# Patient Record
Sex: Female | Born: 1970 | Hispanic: No | Marital: Single | Smoking: Never smoker
Health system: Southern US, Community
[De-identification: ages and names within clinical notes are randomized; demographics above are authoritative.]

## PROBLEM LIST (undated history)

## (undated) DIAGNOSIS — R0600 Dyspnea, unspecified: Secondary | ICD-10-CM

## (undated) DIAGNOSIS — I6789 Other cerebrovascular disease: Secondary | ICD-10-CM

## (undated) DIAGNOSIS — I5022 Chronic systolic (congestive) heart failure: Secondary | ICD-10-CM

## (undated) DIAGNOSIS — G43909 Migraine, unspecified, not intractable, without status migrainosus: Secondary | ICD-10-CM

## (undated) DIAGNOSIS — N183 Chronic kidney disease, stage 3 unspecified: Secondary | ICD-10-CM

## (undated) DIAGNOSIS — I209 Angina pectoris, unspecified: Secondary | ICD-10-CM

## (undated) DIAGNOSIS — G959 Disease of spinal cord, unspecified: Secondary | ICD-10-CM

## (undated) DIAGNOSIS — I10 Essential (primary) hypertension: Secondary | ICD-10-CM

## (undated) DIAGNOSIS — E119 Type 2 diabetes mellitus without complications: Secondary | ICD-10-CM

## (undated) DIAGNOSIS — G4733 Obstructive sleep apnea (adult) (pediatric): Secondary | ICD-10-CM

## (undated) DIAGNOSIS — I255 Ischemic cardiomyopathy: Secondary | ICD-10-CM

## (undated) DIAGNOSIS — J45909 Unspecified asthma, uncomplicated: Secondary | ICD-10-CM

## (undated) DIAGNOSIS — Z8614 Personal history of Methicillin resistant Staphylococcus aureus infection: Secondary | ICD-10-CM

## (undated) DIAGNOSIS — G4730 Sleep apnea, unspecified: Secondary | ICD-10-CM

## (undated) DIAGNOSIS — G4700 Insomnia, unspecified: Secondary | ICD-10-CM

## (undated) DIAGNOSIS — I2510 Atherosclerotic heart disease of native coronary artery without angina pectoris: Secondary | ICD-10-CM

## (undated) DIAGNOSIS — E785 Hyperlipidemia, unspecified: Secondary | ICD-10-CM

## (undated) DIAGNOSIS — I340 Nonrheumatic mitral (valve) insufficiency: Secondary | ICD-10-CM

## (undated) DIAGNOSIS — F419 Anxiety disorder, unspecified: Secondary | ICD-10-CM

## (undated) DIAGNOSIS — E113593 Type 2 diabetes mellitus with proliferative diabetic retinopathy without macular edema, bilateral: Secondary | ICD-10-CM

## (undated) DIAGNOSIS — F32A Depression, unspecified: Secondary | ICD-10-CM

## (undated) DIAGNOSIS — M869 Osteomyelitis, unspecified: Secondary | ICD-10-CM

## (undated) DIAGNOSIS — M48061 Spinal stenosis, lumbar region without neurogenic claudication: Secondary | ICD-10-CM

## (undated) HISTORY — DX: Type 2 diabetes mellitus without complications: E11.9

## (undated) HISTORY — DX: Ischemic cardiomyopathy: I25.5

## (undated) HISTORY — DX: Insomnia, unspecified: G47.00

## (undated) HISTORY — DX: Chronic systolic (congestive) heart failure: I50.22

## (undated) HISTORY — DX: Other cerebrovascular disease: I67.89

## (undated) HISTORY — DX: Spinal stenosis, lumbar region without neurogenic claudication: M48.061

## (undated) HISTORY — DX: Anxiety disorder, unspecified: F41.9

## (undated) HISTORY — DX: Hyperlipidemia, unspecified: E78.5

## (undated) HISTORY — DX: Disease of spinal cord, unspecified: G95.9

## (undated) HISTORY — DX: Chronic kidney disease, stage 3 unspecified: N18.30

## (undated) HISTORY — DX: Type 2 diabetes mellitus with proliferative diabetic retinopathy without macular edema, bilateral: E11.3593

## (undated) HISTORY — DX: Migraine, unspecified, not intractable, without status migrainosus: G43.909

## (undated) HISTORY — DX: Obstructive sleep apnea (adult) (pediatric): G47.33

## (undated) HISTORY — DX: Nonrheumatic mitral (valve) insufficiency: I34.0

## (undated) HISTORY — DX: Unspecified asthma, uncomplicated: J45.909

## (undated) HISTORY — DX: Depression, unspecified: F32.A

---
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
## 1997-12-14 HISTORY — PX: CHOLECYSTECTOMY: SHX55

## 2008-08-17 ENCOUNTER — Emergency Department: Payer: Self-pay | Admitting: Emergency Medicine

## 2009-08-15 ENCOUNTER — Emergency Department: Payer: Self-pay | Admitting: Emergency Medicine

## 2012-05-09 ENCOUNTER — Emergency Department: Payer: Self-pay | Admitting: Emergency Medicine

## 2013-03-10 ENCOUNTER — Ambulatory Visit: Payer: Self-pay | Admitting: Family Medicine

## 2013-03-15 ENCOUNTER — Ambulatory Visit: Payer: Self-pay | Admitting: Family Medicine

## 2013-03-15 LAB — HM MAMMOGRAPHY: HM MAMMO: NORMAL

## 2013-08-10 LAB — HM HIV SCREENING LAB: HM HIV Screening: NEGATIVE

## 2013-12-03 ENCOUNTER — Emergency Department: Payer: Self-pay | Admitting: Emergency Medicine

## 2014-09-04 DIAGNOSIS — F419 Anxiety disorder, unspecified: Secondary | ICD-10-CM | POA: Insufficient documentation

## 2015-09-29 LAB — HM PAP SMEAR

## 2016-01-31 ENCOUNTER — Encounter: Payer: Self-pay | Admitting: Emergency Medicine

## 2016-01-31 ENCOUNTER — Emergency Department: Payer: BLUE CROSS/BLUE SHIELD

## 2016-01-31 DIAGNOSIS — M79672 Pain in left foot: Secondary | ICD-10-CM | POA: Diagnosis present

## 2016-01-31 DIAGNOSIS — E1165 Type 2 diabetes mellitus with hyperglycemia: Secondary | ICD-10-CM | POA: Insufficient documentation

## 2016-01-31 DIAGNOSIS — L03116 Cellulitis of left lower limb: Secondary | ICD-10-CM | POA: Diagnosis not present

## 2016-01-31 LAB — COMPREHENSIVE METABOLIC PANEL
ALT: 18 U/L (ref 14–54)
AST: 15 U/L (ref 15–41)
Albumin: 3.6 g/dL (ref 3.5–5.0)
Alkaline Phosphatase: 78 U/L (ref 38–126)
Anion gap: 8 (ref 5–15)
BUN: 11 mg/dL (ref 6–20)
CHLORIDE: 102 mmol/L (ref 101–111)
CO2: 23 mmol/L (ref 22–32)
Calcium: 8.8 mg/dL — ABNORMAL LOW (ref 8.9–10.3)
Creatinine, Ser: 0.54 mg/dL (ref 0.44–1.00)
GFR calc Af Amer: >60 mL/min (ref >60)
GFR calc non Af Amer: >60 mL/min (ref >60)
Glucose, Bld: 362 mg/dL — ABNORMAL HIGH (ref 65–99)
POTASSIUM: 3.8 mmol/L (ref 3.5–5.1)
SODIUM: 133 mmol/L — AB (ref 135–145)
Total Bilirubin: 1 mg/dL (ref 0.3–1.2)
Total Protein: 7.8 g/dL (ref 6.5–8.1)

## 2016-01-31 LAB — CBC WITH DIFFERENTIAL/PLATELET
BASOS ABS: 0.1 10*3/uL (ref 0–0.1)
BASOS PCT: 1 %
Eosinophils Absolute: 0.1 10*3/uL (ref 0–0.7)
Eosinophils Relative: 1 %
HCT: 34.3 % — ABNORMAL LOW (ref 35.0–47.0)
Hemoglobin: 12.1 g/dL (ref 12.0–16.0)
Lymphocytes Relative: 18 %
Lymphs Abs: 1.9 10*3/uL (ref 1.0–3.6)
MCH: 28.8 pg (ref 26.0–34.0)
MCHC: 35.4 g/dL (ref 32.0–36.0)
MCV: 81.5 fL (ref 80.0–100.0)
Monocytes Absolute: 0.7 10*3/uL (ref 0.2–0.9)
Monocytes Relative: 7 %
NEUTROS ABS: 7.9 10*3/uL — AB (ref 1.4–6.5)
NEUTROS PCT: 73 %
PLATELETS: 287 10*3/uL (ref 150–440)
RBC: 4.21 MIL/uL (ref 3.80–5.20)
RDW: 13.8 % (ref 11.5–14.5)
WBC: 10.6 10*3/uL (ref 3.6–11.0)

## 2016-01-31 NOTE — ED Notes (Signed)
Pt states history of diabetes, is not taking medication for. Pt with wound noted to left 5th toe, wound to left second toe, redness and pain with swelling to left foot. Cms intact to all toes. Pt denies fever.

## 2016-02-01 ENCOUNTER — Emergency Department
Admission: EM | Admit: 2016-02-01 | Discharge: 2016-02-01 | Disposition: A | Discharge status: Home / Self Care | Payer: BLUE CROSS/BLUE SHIELD | Attending: Emergency Medicine | Admitting: Emergency Medicine

## 2016-02-01 DIAGNOSIS — R739 Hyperglycemia, unspecified: Secondary | ICD-10-CM

## 2016-02-01 DIAGNOSIS — E119 Type 2 diabetes mellitus without complications: Secondary | ICD-10-CM

## 2016-02-01 DIAGNOSIS — L03116 Cellulitis of left lower limb: Secondary | ICD-10-CM

## 2016-02-01 HISTORY — DX: Type 2 diabetes mellitus without complications: E11.9

## 2016-02-01 MED ORDER — SULFAMETHOXAZOLE-TRIMETHOPRIM 800-160 MG PO TABS
2.0000 | ORAL_TABLET | Freq: Once | ORAL | Status: AC
  Administered 2016-02-01: 2 via ORAL
  Filled 2016-02-01: qty 2

## 2016-02-01 MED ORDER — METFORMIN HCL 1000 MG PO TABS: 500.0000 mg | ORAL_TABLET | Freq: Two times a day (BID) | ORAL | Status: DC

## 2016-02-01 MED ORDER — METFORMIN HCL 500 MG PO TABS
1000.0000 mg | ORAL_TABLET | Freq: Once | ORAL | Status: AC
  Administered 2016-02-01: 1000 mg via ORAL
  Filled 2016-02-01: qty 2

## 2016-02-01 MED ORDER — SULFAMETHOXAZOLE-TRIMETHOPRIM 800-160 MG PO TABS
2.0000 | ORAL_TABLET | Freq: Two times a day (BID) | ORAL | Status: AC
Start: 2016-02-01 — End: 2016-02-10

## 2016-02-01 NOTE — ED Provider Notes (Signed)
[facility] Emergency Department Provider Note  ____________________________________________  Time seen: 1:05 AM  I have reviewed the triage vital signs and the nursing notes.   HISTORY  Chief Complaint Foot Pain     HPI Sarah Duffy is a 45 y.o. female with history of type 2 diabetes which she is noncompliant secondary to absence of health insurance presents with history of abrasions sustained to her left fifth toe approximately 3 days ago with progressive redness swelling and scant purulent drainage. Patient denies any fever no nausea or vomiting. Patient states that she has not been on any anti-glycemic 4 years secondary to losing her health insurance.     Past Medical History  Diagnosis Date  . Diabetes mellitus without complication (HCC)     There are no active problems to display for this patient.   Past Surgical History  Procedure Laterality Date  . Cholecystectomy      Current Outpatient Rx  Name  Route  Sig  Dispense  Refill  . metFORMIN (GLUCOPHAGE) 1000 MG tablet   Oral   Take 0.5 tablets (500 mg total) by mouth 2 (two) times daily with a meal.   180 tablet   0   . sulfamethoxazole-trimethoprim (BACTRIM DS,SEPTRA DS) 800-160 MG tablet   Oral   Take 2 tablets by mouth 2 (two) times daily.   40 tablet   0     Allergies Review of patient's allergies indicates no known allergies.  No family history on file.  Social History Social History  Substance Use Topics  . Smoking status: Never Smoker   . Smokeless tobacco: Never Used  . Alcohol Use: No    Review of Systems  Constitutional: Negative for fever. Eyes: Negative for visual changes. ENT: Negative for sore throat. Cardiovascular: Negative for chest pain. Respiratory: Negative for shortness of breath. Gastrointestinal: Negative for abdominal pain, vomiting and diarrhea. Genitourinary: Negative for dysuria. Musculoskeletal: Negative for back pain. Skin: Negative  for rash. Positive left fifth toe pain swelling and redness Neurological: Negative for headaches, focal weakness or numbness.   10-point ROS otherwise negative.  ____________________________________________   PHYSICAL EXAM:  VITAL SIGNS: ED Triage Vitals  Enc Vitals Group     BP 01/31/16 2205 167/80 mmHg     Pulse Rate 01/31/16 2205 104     Resp 01/31/16 2205 20     Temp 01/31/16 2205 99 F (37.2 C)     Temp Source 01/31/16 2205 Oral     SpO2 01/31/16 2205 98 %     Weight 01/31/16 2205 178 lb (80.74 kg)     Height 01/31/16 2205  (1.727 m)     Head Cir --      Peak Flow --      Pain Score 01/31/16 2205 8     Pain Loc --      Pain Edu? --      Excl. in GC? --      Constitutional: Alert and oriented. Well appearing and in no distress. Eyes: Conjunctivae are normal. PERRL. Normal extraocular movements. ENT   Head: Normocephalic and atraumatic.   Nose: No congestion/rhinnorhea.   Mouth/Throat: Mucous membranes are moist.   Neck: No stridor. Hematological/Lymphatic/Immunilogical: No cervical lymphadenopathy. Cardiovascular: Normal rate, regular rhythm. Normal and symmetric distal pulses are present in all extremities. No murmurs, rubs, or gallops. Respiratory: Normal respiratory effort without tachypnea nor retractions. Breath sounds are clear and equal bilaterally. No wheezes/rales/rhonchi. Gastrointestinal: Soft and nontender. No distention. There is no CVA  tenderness. Genitourinary: deferred Musculoskeletal: Nontender with normal range of motion in all extremities. No joint effusions.  No lower extremity tenderness nor edema. Neurologic:  Normal speech and language. No gross focal neurologic deficits are appreciated. Speech is normal.  Skin:  Left fifth toe abrasion with surrounding erythema extending up to the dorsum aspect of the foot with associated nonpitting edema Psychiatric: Mood and affect are normal. Speech and behavior are normal. Patient  exhibits appropriate insight and judgment.  ____________________________________________    LABS (pertinent positives/negatives)  Labs Reviewed  CBC WITH DIFFERENTIAL/PLATELET - Abnormal; Notable for the following:    HCT 34.3 (*)    Neutro Abs 7.9 (*)    All other components within normal limits  COMPREHENSIVE METABOLIC PANEL - Abnormal; Notable for the following:    Sodium 133 (*)    Glucose, Bld 362 (*)    Calcium 8.8 (*)    All other components within normal limits           INITIAL IMPRESSION / ASSESSMENT AND PLAN / ED COURSE  Pertinent labs & imaging results that were available during my care of the patient were reviewed by me and considered in my medical decision making (see chart for details).  Patient received Bactrim DS as well as 1000 g of metformin. Patient prescribed the same for home. Patient advised to return to emergency department for any worsening erythema pain, swelling or fever. [facility]  ____________________________________________   FINAL CLINICAL IMPRESSION(S) / ED DIAGNOSES  Final diagnoses:  Cellulitis of left foot  Hyperglycemia  Type 2 diabetes mellitus without complication, without long-term current use of insulin (HCC)      Darci Current, MD 02/01/16 [phone]

## 2016-02-01 NOTE — ED Notes (Signed)
Redness outlined and teaching provided to patient per MD order. Gauze applied with instructions on home care. Pt verbalized understanding and ability to comply.

## 2016-02-01 NOTE — Discharge Instructions (Signed)
Cellulitis °Cellulitis is an infection of the skin and the tissue beneath it. The infected area is usually red and tender. Cellulitis occurs most often in the arms and lower legs.  °CAUSES  °Cellulitis is caused by bacteria that enter the skin through cracks or cuts in the skin. The most common types of bacteria that cause cellulitis are staphylococci and streptococci. °SIGNS AND SYMPTOMS  °· Redness and warmth. °· Swelling. °· Tenderness or pain. °· Fever. °DIAGNOSIS  °Your health care provider can usually determine what is wrong based on a physical exam. Blood tests may also be done. °TREATMENT  °Treatment usually involves taking an antibiotic medicine. °HOME CARE INSTRUCTIONS  °· Take your antibiotic medicine as directed by your health care provider. Finish the antibiotic even if you start to feel better. °· Keep the infected arm or leg elevated to reduce swelling. °· Apply a warm cloth to the affected area up to 4 times per day to relieve pain. °· Take medicines only as directed by your health care provider. °· Keep all follow-up visits as directed by your health care provider. °SEEK MEDICAL CARE IF:  °· You notice red streaks coming from the infected area. °· Your red area gets larger or turns dark in color. °· Your bone or joint underneath the infected area becomes painful after the skin has healed. °· Your infection returns in the same area or another area. °· You notice a swollen bump in the infected area. °· You develop new symptoms. °· You have a fever. °SEEK IMMEDIATE MEDICAL CARE IF:  °· You feel very sleepy. °· You develop vomiting or diarrhea. °· You have a general ill feeling (malaise) with muscle aches and pains. °  °This information is not intended to replace advice given to you by your health care provider. Make sure you discuss any questions you have with your health care provider. °  °Document Released: 09/09/2005 Document Revised: 08/21/2015 Document Reviewed: 02/15/2012 °Elsevier Interactive  Patient Education ©2016 [employer] ° °Hyperglycemia °Hyperglycemia occurs when the glucose (sugar) in your blood is too high. Hyperglycemia can happen for many reasons, but it most often happens to people who do not know they have diabetes or are not managing their diabetes properly.  °CAUSES  °Whether you have diabetes or not, there are other causes of hyperglycemia. Hyperglycemia can occur when you have diabetes, but it can also occur in other situations that you might not be as aware of, such as: °Diabetes °· If you have diabetes and are having problems controlling your blood glucose, hyperglycemia could occur because of some of the following reasons: °¨ Not following your meal plan. °¨ Not taking your diabetes medications or not taking it properly. °¨ Exercising less or doing less activity than you normally do. °¨ Being sick. °Pre-diabetes °· This cannot be ignored. Before people develop Type 2 diabetes, they almost always have "pre-diabetes." This is when your blood glucose levels are higher than normal, but not yet high enough to be diagnosed as diabetes. Research has shown that some long-term damage to the body, especially the heart and circulatory system, may already be occurring during pre-diabetes. If you take action to manage your blood glucose when you have pre-diabetes, you may delay or prevent Type 2 diabetes from developing. °Stress °· If you have diabetes, you may be "diet" controlled or on oral medications or insulin to control your diabetes. However, you may find that your blood glucose is higher than usual in the hospital whether you have   diabetes or not. This is often referred to as "stress hyperglycemia." Stress can elevate your blood glucose. This happens because of hormones put out by the body during times of stress. If stress has been the cause of your high blood glucose, it can be followed regularly by your caregiver. That way he/she can make sure your hyperglycemia does not continue to  get worse or progress to diabetes. °Steroids °· Steroids are medications that act on the infection fighting system (immune system) to block inflammation or infection. One side effect can be a rise in blood glucose. Most people can produce enough extra insulin to allow for this rise, but for those who cannot, steroids make blood glucose levels go even higher. It is not unusual for steroid treatments to "uncover" diabetes that is developing. It is not always possible to determine if the hyperglycemia will go away after the steroids are stopped. A special blood test called an A1c is sometimes done to determine if your blood glucose was elevated before the steroids were started. °SYMPTOMS °· Thirsty. °· Frequent urination. °· Dry mouth. °· Blurred vision. °· Tired or fatigue. °· Weakness. °· Sleepy. °· Tingling in feet or leg. °DIAGNOSIS  °Diagnosis is made by monitoring blood glucose in one or all of the following ways: °· A1c test. This is a chemical found in your blood. °· Fingerstick blood glucose monitoring. °· Laboratory results. °TREATMENT  °First, knowing the cause of the hyperglycemia is important before the hyperglycemia can be treated. Treatment may include, but is not be limited to: °· Education. °· Change or adjustment in medications. °· Change or adjustment in meal plan. °· Treatment for an illness, infection, etc. °· More frequent blood glucose monitoring. °· Change in exercise plan. °· Decreasing or stopping steroids. °· Lifestyle changes. °HOME CARE INSTRUCTIONS  °· Test your blood glucose as directed. °· Exercise regularly. Your caregiver will give you instructions about exercise. Pre-diabetes or diabetes which comes on with stress is helped by exercising. °· Eat wholesome, balanced meals. Eat often and at regular, fixed times. Your caregiver or nutritionist will give you a meal plan to guide your sugar intake. °· Being at an ideal weight is important. If needed, losing as little as 10 to 15 pounds may  help improve blood glucose levels. °SEEK MEDICAL CARE IF:  °· You have questions about medicine, activity, or diet. °· You continue to have symptoms (problems such as increased thirst, urination, or weight gain). °SEEK IMMEDIATE MEDICAL CARE IF:  °· You are vomiting or have diarrhea. °· Your breath smells fruity. °· You are breathing faster or slower. °· You are very sleepy or incoherent. °· You have numbness, tingling, or pain in your feet or hands. °· You have chest pain. °· Your symptoms get worse even though you have been following your caregiver's orders. °· If you have any other questions or concerns. °  °This information is not intended to replace advice given to you by your health care provider. Make sure you discuss any questions you have with your health care provider. °  °Document Released: 05/26/2001 Document Revised: 02/22/2012 Document Reviewed: 08/06/2015 °Elsevier Interactive Patient Education ©2016 Elsevier Inc. °   °

## 2016-02-03 DIAGNOSIS — E11621 Type 2 diabetes mellitus with foot ulcer: Secondary | ICD-10-CM | POA: Insufficient documentation

## 2016-02-03 DIAGNOSIS — L97509 Non-pressure chronic ulcer of other part of unspecified foot with unspecified severity: Secondary | ICD-10-CM

## 2016-02-03 DIAGNOSIS — IMO0002 Reserved for concepts with insufficient information to code with codable children: Secondary | ICD-10-CM | POA: Insufficient documentation

## 2016-02-03 DIAGNOSIS — E1165 Type 2 diabetes mellitus with hyperglycemia: Secondary | ICD-10-CM

## 2016-02-07 HISTORY — PX: TOE SURGERY: SHX1073

## 2016-02-14 ENCOUNTER — Ambulatory Visit: Payer: Self-pay | Admitting: Family Medicine

## 2016-02-20 ENCOUNTER — Ambulatory Visit: Payer: Self-pay | Admitting: Family Medicine

## 2016-02-25 ENCOUNTER — Encounter: Payer: Self-pay | Admitting: Family Medicine

## 2016-02-25 ENCOUNTER — Ambulatory Visit (INDEPENDENT_AMBULATORY_CARE_PROVIDER_SITE_OTHER): Payer: BLUE CROSS/BLUE SHIELD | Admitting: Family Medicine

## 2016-02-25 VITALS — BP 126/74 | HR 90 | Temp 98.4°F | Resp 18 | Ht 68.0 in | Wt 182.4 lb

## 2016-02-25 DIAGNOSIS — Z8614 Personal history of Methicillin resistant Staphylococcus aureus infection: Secondary | ICD-10-CM

## 2016-02-25 DIAGNOSIS — D649 Anemia, unspecified: Secondary | ICD-10-CM | POA: Diagnosis not present

## 2016-02-25 DIAGNOSIS — Z09 Encounter for follow-up examination after completed treatment for conditions other than malignant neoplasm: Secondary | ICD-10-CM | POA: Diagnosis not present

## 2016-02-25 DIAGNOSIS — G4700 Insomnia, unspecified: Secondary | ICD-10-CM | POA: Insufficient documentation

## 2016-02-25 DIAGNOSIS — Z79899 Other long term (current) drug therapy: Secondary | ICD-10-CM | POA: Diagnosis not present

## 2016-02-25 DIAGNOSIS — Z114 Encounter for screening for human immunodeficiency virus [HIV]: Secondary | ICD-10-CM | POA: Diagnosis not present

## 2016-02-25 DIAGNOSIS — Z23 Encounter for immunization: Secondary | ICD-10-CM

## 2016-02-25 DIAGNOSIS — E1165 Type 2 diabetes mellitus with hyperglycemia: Secondary | ICD-10-CM

## 2016-02-25 DIAGNOSIS — M79673 Pain in unspecified foot: Secondary | ICD-10-CM | POA: Insufficient documentation

## 2016-02-25 DIAGNOSIS — Z794 Long term (current) use of insulin: Secondary | ICD-10-CM

## 2016-02-25 DIAGNOSIS — Z13220 Encounter for screening for lipoid disorders: Secondary | ICD-10-CM | POA: Diagnosis not present

## 2016-02-25 DIAGNOSIS — R5383 Other fatigue: Secondary | ICD-10-CM | POA: Diagnosis not present

## 2016-02-25 DIAGNOSIS — E11621 Type 2 diabetes mellitus with foot ulcer: Secondary | ICD-10-CM

## 2016-02-25 DIAGNOSIS — I10 Essential (primary) hypertension: Secondary | ICD-10-CM | POA: Insufficient documentation

## 2016-02-25 DIAGNOSIS — E871 Hypo-osmolality and hyponatremia: Secondary | ICD-10-CM

## 2016-02-25 DIAGNOSIS — IMO0002 Reserved for concepts with insufficient information to code with codable children: Secondary | ICD-10-CM

## 2016-02-25 DIAGNOSIS — A4902 Methicillin resistant Staphylococcus aureus infection, unspecified site: Secondary | ICD-10-CM

## 2016-02-25 DIAGNOSIS — I340 Nonrheumatic mitral (valve) insufficiency: Secondary | ICD-10-CM | POA: Insufficient documentation

## 2016-02-25 DIAGNOSIS — L97509 Non-pressure chronic ulcer of other part of unspecified foot with unspecified severity: Secondary | ICD-10-CM

## 2016-02-25 HISTORY — DX: Personal history of Methicillin resistant Staphylococcus aureus infection: Z86.14

## 2016-02-25 HISTORY — DX: Methicillin resistant Staphylococcus aureus infection, unspecified site: A49.02

## 2016-02-25 MED ORDER — DULAGLUTIDE 1.5 MG/0.5ML ~~LOC~~ SOAJ: 1.5000 mg | SUBCUTANEOUS | Status: DC

## 2016-02-25 NOTE — Progress Notes (Signed)
Name: Sarah Duffy   MRN: [id number]    DOB: [birth year]   Date:02/25/2016       Progress Note  Subjective  Chief Complaint  Chief Complaint  Patient presents with  . Medication Refill  . Diabetes    Checks sugar 3-4x daily, Low-107 Average-200's High-362    HPI  DM with left foot ulcer: hospitalized at [facility] 02/03/2016 for evaluation of a foot ulcer ( started after she fell and had a blister on left foot ), her hgbA1C was very high during admission 13.9%, had debridement of lesion of left foot it was positive for MRSA, she is on wound vac and two antibiotics, has follow up with vascular surgeon tomorrow at [facility] Used to see Dr. Milinda Pointer but not recently. She did not have insurance for the past year, and was without medication for the past couple of years. She states glucose still high but down to the 200's now. She has polyphagia and polyuria, polydipsia has improved.   During hospital stay she was found to have very high sed rate and c-reactive protein, also anemia and low sodium.    Patient Active Problem List   Diagnosis Date Noted  . MRSA infection within last 3 months 02/25/2016  . MI (mitral incompetence) 02/25/2016  . HLD (hyperlipidemia) 02/25/2016  . Foot pain 02/25/2016  . Insomnia 02/25/2016  . Uncontrolled type 2 diabetes mellitus with foot ulcer ([place]) 02/03/2016    Past Surgical History  Procedure Laterality Date  . Cholecystectomy  1999  . Toe surgery Left 02/07/2016    Pinky Toe    Family History  Problem Relation Age of Onset  . Diabetes Mother   . Heart disease Father   . Heart disease Sister     Social History   Social History  . Marital Status: Single    Spouse Name: N/A  . Number of Children: N/A  . Years of Education: N/A   Occupational History  . Not on file.   Social History Main Topics  . Smoking status: Never Smoker   . Smokeless tobacco: Never Used  . Alcohol Use: No  . Drug Use: No  . Sexual Activity:    Partners: Male    Birth  Control/ Protection: Injection   Other Topics Concern  . Not on file   Social History Narrative     Current outpatient prescriptions:  .  amoxicillin-clavulanate (AUGMENTIN) 875-125 MG tablet, , Disp: , Rfl: 0 .  BAYER CONTOUR NEXT TEST test strip, , Disp: , Rfl: 0 .  doxycycline (MONODOX) 100 MG capsule, , Disp: , Rfl: 0 .  Insulin Pen Needle (UNIFINE PENTIPS) 31G X 5 MM MISC, by Does not apply route., Disp: , Rfl:  .  LANTUS SOLOSTAR 100 UNIT/ML Solostar Pen, , Disp: , Rfl: 12 .  metFORMIN (GLUCOPHAGE) 1000 MG tablet, Take 0.5 tablets (500 mg total) by mouth 2 (two) times daily with a meal., Disp: 180 tablet, Rfl: 0 .  Multiple Vitamin (MULTIVITAMIN) capsule, Take by mouth., Disp: , Rfl:  .  ondansetron (ZOFRAN-ODT) 4 MG disintegrating tablet, , Disp: , Rfl: 0 .  Oxycodone HCl 10 MG TABS, TK 1 T PO  Q 4 H PRN, Disp: , Rfl: 0 .  polyethylene glycol (MIRALAX / GLYCOLAX) packet, , Disp: , Rfl: 0 .  senna (SENOKOT) 8.6 MG TABS tablet, Take by mouth., Disp: , Rfl:  .  TRUEPLUS LANCETS 28G MISC, , Disp: , Rfl: 9  No Known Allergies   ROS  Ten systems  reviewed and is negative except as mentioned in HPI  Some nausea from the scent of wound vac, chronic insomnia   Objective  Filed Vitals:   02/25/16 1458  BP: 126/74  Pulse: 90  Temp: 98.4 F (36.9 C)  TempSrc: Oral  Resp: 18  Height: 5' 8"  (1.727 m)  Weight: 182 lb 6.4 oz (82.736 kg)  SpO2: 98%    Body mass index is 27.74 kg/(m^2).  Physical Exam  Constitutional: Patient appears well-developed and well-nourished.No distress.  HEENT: head atraumatic, normocephalic, pupils equal and reactive to light,neck supple, throat within normal limits Cardiovascular: Normal rate, regular rhythm and normal heart sounds.  No murmur heard. No BLE edema. Pulmonary/Chest: Effort normal and breath sounds normal. No respiratory distress. Abdominal: Soft.  There is no tenderness. Psychiatric: Patient has a normal mood and affect. behavior  is normal. Judgment and thought content normal. Muscular Skeletal: ortho boot with wound vac on left foot  Recent Results (from the past 2160 hour(s))  CBC with Differential     Status: Abnormal   Collection Time: 01/31/16 10:12 PM  Result Value Ref Range   WBC 10.6 3.6 - 11.0 K/uL   RBC 4.21 3.80 - 5.20 MIL/uL   Hemoglobin 12.1 12.0 - 16.0 g/dL   HCT 34.3 (L) 35.0 - 47.0 %   MCV 81.5 80.0 - 100.0 fL   MCH 28.8 26.0 - 34.0 pg   MCHC 35.4 32.0 - 36.0 g/dL   RDW 13.8 11.5 - 14.5 %   Platelets 287 150 - 440 K/uL   Neutrophils Relative % 73 %   Neutro Abs 7.9 (H) 1.4 - 6.5 K/uL   Lymphocytes Relative 18 %   Lymphs Abs 1.9 1.0 - 3.6 K/uL   Monocytes Relative 7 %   Monocytes Absolute 0.7 0.2 - 0.9 K/uL   Eosinophils Relative 1 %   Eosinophils Absolute 0.1 0 - 0.7 K/uL   Basophils Relative 1 %   Basophils Absolute 0.1 0 - 0.1 K/uL  Comprehensive metabolic panel     Status: Abnormal   Collection Time: 01/31/16 10:12 PM  Result Value Ref Range   Sodium 133 (L) 135 - 145 mmol/L   Potassium 3.8 3.5 - 5.1 mmol/L   Chloride 102 101 - 111 mmol/L   CO2 23 22 - 32 mmol/L   Glucose, Bld 362 (H) 65 - 99 mg/dL   BUN 11 6 - 20 mg/dL   Creatinine, Ser 0.54 0.44 - 1.00 mg/dL   Calcium 8.8 (L) 8.9 - 10.3 mg/dL   Total Protein 7.8 6.5 - 8.1 g/dL   Albumin 3.6 3.5 - 5.0 g/dL   AST 15 15 - 41 U/L   ALT 18 14 - 54 U/L   Alkaline Phosphatase 78 38 - 126 U/L   Total Bilirubin 1.0 0.3 - 1.2 mg/dL   GFR calc non Af Amer >60 >60 mL/min   GFR calc Af Amer >60 >60 mL/min    Comment: (NOTE) The eGFR has been calculated using the CKD EPI equation. This calculation has not been validated in all clinical situations. eGFR's persistently <60 mL/min signify possible Chronic Kidney Disease.    Anion gap 8 5 - 15      PHQ2/9: Depression screen PHQ 2/9 02/25/2016  Decreased Interest 0  Down, Depressed, Hopeless 0  PHQ - 2 Score 0    Fall Risk: Fall Risk  02/25/2016  Falls in the past year? No     Functional Status Survey: Is the patient deaf or have  difficulty hearing?: No Does the patient have difficulty seeing, even when wearing glasses/contacts?: No Does the patient have difficulty concentrating, remembering, or making decisions?: No Does the patient have difficulty walking or climbing stairs?: No Does the patient have difficulty dressing or bathing?: No Does the patient have difficulty doing errands alone such as visiting a doctor's office or shopping?: No    Assessment & Plan  1. Uncontrolled type 2 diabetes mellitus with foot ulcer, with long-term current use of insulin ([place])  hgbA1C was 13.9 in Feb, continue Lantus and metformin, we will add Trulicity - discussed possible side effects, monitor fasting glucose to keep between 100-120, if drops below that may decrease dose of Lantus by 3 units every three days.   - POCT UA - Microalbumin  2. MRSA infection within last 3 months  Continue antibiotics  3. Need for Tdap vaccination  - Tdap vaccine greater than or equal to 7yo IM  4. Need for pneumococcal vaccination  - Pneumococcal polysaccharide vaccine 23-valent greater than or equal to 2yo subcutaneous/IM  5. Lipid screening  - Lipid panel  6. Long-term use of high-risk medication  - Comprehensive metabolic panel  7. Hyponatremia  - Comprehensive metabolic panel  8. Anemia, unspecified  - CBC with Differential/Platelet - Iron - Iron and TIBC  9. Encounter for screening for HIV  - HIV antibody

## 2016-02-29 LAB — CBC WITH DIFFERENTIAL/PLATELET
BASOS ABS: 0.1 10*3/uL (ref 0.0–0.2)
Basos: 1 %
EOS (ABSOLUTE): 0.1 10*3/uL (ref 0.0–0.4)
Eos: 2 %
Hematocrit: 36.7 % (ref 34.0–46.6)
Hemoglobin: 12.1 g/dL (ref 11.1–15.9)
IMMATURE GRANS (ABS): 0 10*3/uL (ref 0.0–0.1)
Immature Granulocytes: 0 %
LYMPHS: 25 %
Lymphocytes Absolute: 2.3 10*3/uL (ref 0.7–3.1)
MCH: 27.6 pg (ref 26.6–33.0)
MCHC: 33 g/dL (ref 31.5–35.7)
MCV: 84 fL (ref 79–97)
MONOS ABS: 0.7 10*3/uL (ref 0.1–0.9)
Monocytes: 7 %
NEUTROS ABS: 6 10*3/uL (ref 1.4–7.0)
NEUTROS PCT: 65 %
PLATELETS: 304 10*3/uL (ref 150–379)
RBC: 4.38 x10E6/uL (ref 3.77–5.28)
RDW: 14 % (ref 12.3–15.4)
WBC: 9.2 10*3/uL (ref 3.4–10.8)

## 2016-02-29 LAB — COMPREHENSIVE METABOLIC PANEL
A/G RATIO: 1.1 — AB (ref 1.2–2.2)
ALT: 12 IU/L (ref 0–32)
AST: 19 IU/L (ref 0–40)
Albumin: 3.9 g/dL (ref 3.5–5.5)
Alkaline Phosphatase: 81 IU/L (ref 39–117)
BILIRUBIN TOTAL: 0.4 mg/dL (ref 0.0–1.2)
BUN/Creatinine Ratio: 15 (ref 9–23)
BUN: 11 mg/dL (ref 6–24)
CHLORIDE: 95 mmol/L — AB (ref 96–106)
CO2: 20 mmol/L (ref 18–29)
Calcium: 9.3 mg/dL (ref 8.7–10.2)
Creatinine, Ser: 0.74 mg/dL (ref 0.57–1.00)
GFR calc non Af Amer: 97 mL/min/{1.73_m2} (ref >59)
GFR, EST AFRICAN AMERICAN: 112 mL/min/{1.73_m2} (ref >59)
Globulin, Total: 3.7 g/dL (ref 1.5–4.5)
Glucose: 276 mg/dL — ABNORMAL HIGH (ref 65–99)
POTASSIUM: 4.9 mmol/L (ref 3.5–5.2)
Sodium: 136 mmol/L (ref 134–144)
Total Protein: 7.6 g/dL (ref 6.0–8.5)

## 2016-02-29 LAB — IRON AND TIBC
IRON SATURATION: 19 % (ref 15–55)
Iron: 70 ug/dL (ref 27–159)
TIBC: 372 ug/dL (ref 250–450)
UIBC: 302 ug/dL (ref 131–425)

## 2016-02-29 LAB — LIPID PANEL
CHOL/HDL RATIO: 5 ratio — AB (ref 0.0–4.4)
Cholesterol, Total: 208 mg/dL — ABNORMAL HIGH (ref 100–199)
HDL: 42 mg/dL (ref >39)
LDL CALC: 152 mg/dL — AB (ref 0–99)
TRIGLYCERIDES: 69 mg/dL (ref 0–149)
VLDL Cholesterol Cal: 14 mg/dL (ref 5–40)

## 2016-02-29 LAB — VITAMIN B12: VITAMIN B 12: 570 pg/mL (ref 211–946)

## 2016-02-29 LAB — HIV ANTIBODY (ROUTINE TESTING W REFLEX): HIV SCREEN 4TH GENERATION: NONREACTIVE

## 2016-03-03 ENCOUNTER — Other Ambulatory Visit: Payer: Self-pay | Admitting: Family Medicine

## 2016-03-03 ENCOUNTER — Telehealth: Payer: Self-pay

## 2016-03-03 DIAGNOSIS — E785 Hyperlipidemia, unspecified: Secondary | ICD-10-CM

## 2016-03-03 MED ORDER — ATORVASTATIN CALCIUM 40 MG PO TABS: 40.0000 mg | ORAL_TABLET | Freq: Every day | ORAL | Status: DC

## 2016-03-03 NOTE — Telephone Encounter (Signed)
Sarah [name], MD at 03/03/2016 1:36 PM  I do not prescribed pain medications on a regular basis. She will need to contact surgeon about it , or ask them to refer her to the pain clinic. I am sorry.  Patient was informed of Dr. [name]' message above and said ok.

## 2016-03-03 NOTE — Progress Notes (Unsigned)
I do not prescribed pain medications on a regular basis. She will need to contact surgeon about it , or ask them to refer her to the pain clinic. I am sorry

## 2016-03-06 ENCOUNTER — Other Ambulatory Visit: Payer: Self-pay

## 2016-03-06 ENCOUNTER — Telehealth: Payer: Self-pay

## 2016-03-06 MED ORDER — LIRAGLUTIDE 18 MG/3ML ~~LOC~~ SOPN: 1.8000 mg | PEN_INJECTOR | Freq: Every day | SUBCUTANEOUS | Status: DC

## 2016-03-06 NOTE — Telephone Encounter (Signed)
[facility] [employer] [facility] the request for Trulicity is denied. It stated that she must try Victoza first.

## 2016-03-06 NOTE — Telephone Encounter (Signed)
Spoke with patient and notified her unfortunately, her insurance has denied the Trulicity and I called [facility] for Trulicity and with her Insurance [employer] there is not good coverage with that medication and she can use the copay card that she can download from [email or web address] if she would like however, I do not know how much it will cost her. She would have to go to the pharmacy and have them run it against her insurance and see. Patient verbalized understanding.

## 2016-03-10 ENCOUNTER — Ambulatory Visit (INDEPENDENT_AMBULATORY_CARE_PROVIDER_SITE_OTHER): Payer: BLUE CROSS/BLUE SHIELD | Admitting: Family Medicine

## 2016-03-10 ENCOUNTER — Encounter: Payer: Self-pay | Admitting: Family Medicine

## 2016-03-10 VITALS — BP 130/80 | HR 106 | Temp 98.5°F | Resp 16 | Wt 185.8 lb

## 2016-03-10 DIAGNOSIS — E1165 Type 2 diabetes mellitus with hyperglycemia: Secondary | ICD-10-CM

## 2016-03-10 DIAGNOSIS — L97509 Non-pressure chronic ulcer of other part of unspecified foot with unspecified severity: Secondary | ICD-10-CM | POA: Diagnosis not present

## 2016-03-10 DIAGNOSIS — E11621 Type 2 diabetes mellitus with foot ulcer: Secondary | ICD-10-CM | POA: Diagnosis not present

## 2016-03-10 DIAGNOSIS — IMO0002 Reserved for concepts with insufficient information to code with codable children: Secondary | ICD-10-CM

## 2016-03-10 DIAGNOSIS — E785 Hyperlipidemia, unspecified: Secondary | ICD-10-CM | POA: Insufficient documentation

## 2016-03-10 DIAGNOSIS — Z794 Long term (current) use of insulin: Secondary | ICD-10-CM | POA: Diagnosis not present

## 2016-03-10 MED ORDER — ASPIRIN EC 81 MG PO TBEC
81.0000 mg | DELAYED_RELEASE_TABLET | Freq: Every day | ORAL | Status: DC
Start: 2016-03-10 — End: 2021-02-11

## 2016-03-10 NOTE — Progress Notes (Signed)
Name: Solstice Lastinger   MRN: [id number]    DOB: 12/13/1971   Date:03/10/2016       Progress Note  Subjective  Chief Complaint  Chief Complaint  Patient presents with  . Follow-up    patient is here for her 2 week f/u  . Medication Problem    patient was not able to get her trulicity pen even with the coupon. patient will pick up the Vicotza tomorrow when she goes back to [facility].    HPI  DM with left foot ulcer: hospitalized at [facility] 02/03/2016 for evaluation of a foot ulcer ( started after she fell and had a blister on left foot ), her hgbA1C was very high during admission 13.9%, had debridement of lesion of left foot it was positive for MRSA, she is off  wound vac now and also off antibiotics. She has follow up with vascular surgeon tomorrow at [facility]. 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 Used to see Dr. Milinda Pointer but not recently. She did not have insurance for the past year, and was without medication for the past couple of years. She was not able to get Victoza yet, going to [place] tomorrow. Therefore currently on Lantus and metformin. She states highest in am's is around 200. She states her polyphagia, but not as severe polyuria or polydipsia. She is on a high protein diet.   Hyperlipidemia: she is going to start statin therapy tomorrow when she goes to [facility] to get her prescription.   Patient Active Problem List   Diagnosis Date Noted  . Hyperlipidemia 03/10/2016  . MRSA infection within last 3 months 02/25/2016  . MI (mitral incompetence) 02/25/2016  . HLD (hyperlipidemia) 02/25/2016  . Foot pain 02/25/2016  . Insomnia 02/25/2016  . Uncontrolled type 2 diabetes mellitus with foot ulcer ([place]) 02/03/2016    Past Surgical History  Procedure Laterality Date  . Cholecystectomy  1999  . Toe surgery Left 02/07/2016    Pinky Toe    Family History  Problem Relation Age of Onset  . Diabetes Mother   . Heart disease Father   . Heart disease Sister     Social History   Social  History  . Marital Status: Single    Spouse Name: N/A  . Number of Children: N/A  . Years of Education: N/A   Occupational History  . Not on file.   Social History Main Topics  . Smoking status: Never Smoker   . Smokeless tobacco: Never Used  . Alcohol Use: No  . Drug Use: No  . Sexual Activity:    Partners: Male    Birth Control/ Protection: Injection   Other Topics Concern  . Not on file   Social History Narrative     Current outpatient prescriptions:  .  BAYER CONTOUR NEXT TEST test strip, , Disp: , Rfl: 0 .  Insulin Pen Needle (UNIFINE PENTIPS) 31G X 5 MM MISC, by Does not apply route., Disp: , Rfl:  .  LANTUS SOLOSTAR 100 UNIT/ML Solostar Pen, , Disp: , Rfl: 12 .  Liraglutide (VICTOZA) 18 MG/3ML SOPN, Inject 0.3 mLs (1.8 mg total) into the skin daily., Disp: 18 mL, Rfl: 0 .  Multiple Vitamin (MULTIVITAMIN) capsule, Take by mouth., Disp: , Rfl:  .  atorvastatin (LIPITOR) 40 MG tablet, Take 1 tablet (40 mg total) by mouth daily. (Patient not taking: Reported on 03/10/2016), Disp: 90 tablet, Rfl: 1 .  metFORMIN (GLUCOPHAGE) 1000 MG tablet, Take 0.5 tablets (500 mg total) by mouth  2 (two) times daily with a meal. (Patient not taking: Reported on 03/10/2016), Disp: 180 tablet, Rfl: 0 .  TRUEPLUS LANCETS 28G MISC, , Disp: , Rfl: 9  No Known Allergies   ROS  Ten systems reviewed and is negative except as mentioned in HPI   Objective  Filed Vitals:   03/10/16 1025  BP: 130/80  Pulse: 106  Temp: 98.5 F (36.9 C)  TempSrc: Oral  Resp: 16  Weight: 185 lb 12.8 oz (84.278 kg)  SpO2: 98%    Body mass index is 28.26 kg/(m^2).  Physical Exam  Constitutional: Patient appears well-developed and well-nourished.No distress.  HEENT: head atraumatic, normocephalic, pupils equal and reactive to light,neck supple, throat within normal limits Cardiovascular: Normal rate, regular rhythm and normal heart sounds. No murmur heard. No BLE edema. Pulmonary/Chest: Effort normal and  breath sounds normal. No respiratory distress. Abdominal: Soft. There is no tenderness. Psychiatric: Patient has a normal mood and affect. behavior is normal. Judgment and thought content normal. Muscular Skeletal: ortho shoe, no longer has wound vac - keep follow up with vascular surgeon  Recent Results (from the past 2160 hour(s))  CBC with Differential     Status: Abnormal   Collection Time: 01/31/16 10:12 PM  Result Value Ref Range   WBC 10.6 3.6 - 11.0 K/uL   RBC 4.21 3.80 - 5.20 MIL/uL   Hemoglobin 12.1 12.0 - 16.0 g/dL   HCT 34.3 (L) 35.0 - 47.0 %   MCV 81.5 80.0 - 100.0 fL   MCH 28.8 26.0 - 34.0 pg   MCHC 35.4 32.0 - 36.0 g/dL   RDW 13.8 11.5 - 14.5 %   Platelets 287 150 - 440 K/uL   Neutrophils Relative % 73 %   Neutro Abs 7.9 (H) 1.4 - 6.5 K/uL   Lymphocytes Relative 18 %   Lymphs Abs 1.9 1.0 - 3.6 K/uL   Monocytes Relative 7 %   Monocytes Absolute 0.7 0.2 - 0.9 K/uL   Eosinophils Relative 1 %   Eosinophils Absolute 0.1 0 - 0.7 K/uL   Basophils Relative 1 %   Basophils Absolute 0.1 0 - 0.1 K/uL  Comprehensive metabolic panel     Status: Abnormal   Collection Time: 01/31/16 10:12 PM  Result Value Ref Range   Sodium 133 (L) 135 - 145 mmol/L   Potassium 3.8 3.5 - 5.1 mmol/L   Chloride 102 101 - 111 mmol/L   CO2 23 22 - 32 mmol/L   Glucose, Bld 362 (H) 65 - 99 mg/dL   BUN 11 6 - 20 mg/dL   Creatinine, Ser 0.54 0.44 - 1.00 mg/dL   Calcium 8.8 (L) 8.9 - 10.3 mg/dL   Total Protein 7.8 6.5 - 8.1 g/dL   Albumin 3.6 3.5 - 5.0 g/dL   AST 15 15 - 41 U/L   ALT 18 14 - 54 U/L   Alkaline Phosphatase 78 38 - 126 U/L   Total Bilirubin 1.0 0.3 - 1.2 mg/dL   GFR calc non Af Amer >60 >60 mL/min   GFR calc Af Amer >60 >60 mL/min    Comment: (NOTE) The eGFR has been calculated using the CKD EPI equation. This calculation has not been validated in all clinical situations. eGFR's persistently <60 mL/min signify possible Chronic Kidney Disease.    Anion gap 8 5 - 15  Lipid  panel     Status: Abnormal   Collection Time: 02/28/16  9:51 AM  Result Value Ref Range   Cholesterol, Total 208 (H)  100 - 199 mg/dL   Triglycerides 69 0 - 149 mg/dL   HDL 42 >39 mg/dL   VLDL Cholesterol Cal 14 5 - 40 mg/dL   LDL Calculated 152 (H) 0 - 99 mg/dL   Chol/HDL Ratio 5.0 (H) 0.0 - 4.4 ratio units    Comment:                                   T. Chol/HDL Ratio                                             Men  Women                               1/2 Avg.Risk  3.4    3.3                                   Avg.Risk  5.0    4.4                                2X Avg.Risk  9.6    7.1                                3X Avg.Risk 23.4   11.0   Comprehensive metabolic panel     Status: Abnormal   Collection Time: 02/28/16  9:51 AM  Result Value Ref Range   Glucose 276 (H) 65 - 99 mg/dL    Comment: Specimen received in contact with cells. Hemolysis present. GLUC may be decreased and K increased. Clinical correlation indicated.    BUN 11 6 - 24 mg/dL   Creatinine, Ser 0.74 0.57 - 1.00 mg/dL   GFR calc non Af Amer 97 >59 mL/min/1.73   GFR calc Af Amer 112 >59 mL/min/1.73   BUN/Creatinine Ratio 15 9 - 23   Sodium 136 134 - 144 mmol/L   Potassium 4.9 3.5 - 5.2 mmol/L   Chloride 95 (L) 96 - 106 mmol/L   CO2 20 18 - 29 mmol/L   Calcium 9.3 8.7 - 10.2 mg/dL   Total Protein 7.6 6.0 - 8.5 g/dL   Albumin 3.9 3.5 - 5.5 g/dL   Globulin, Total 3.7 1.5 - 4.5 g/dL   Albumin/Globulin Ratio 1.1 (L) 1.2 - 2.2    Comment:               **Please note reference interval change**   Bilirubin Total 0.4 0.0 - 1.2 mg/dL   Alkaline Phosphatase 81 39 - 117 IU/L   AST 19 0 - 40 IU/L   ALT 12 0 - 32 IU/L  CBC with Differential/Platelet     Status: None   Collection Time: 02/28/16  9:51 AM  Result Value Ref Range   WBC 9.2 3.4 - 10.8 x10E3/uL   RBC 4.38 3.77 - 5.28 x10E6/uL   Hemoglobin 12.1 11.1 - 15.9 g/dL   Hematocrit 36.7 34.0 - 46.6 %   MCV 84 79 - 97 fL   MCH 27.6 26.6 - 33.0 pg   MCHC 33.0  31.5 - 35.7 g/dL   RDW 14.0 12.3 - 15.4 %   Platelets 304 150 - 379 x10E3/uL   Neutrophils 65 %   Lymphs 25 %   Monocytes 7 %   Eos 2 %   Basos 1 %   Neutrophils Absolute 6.0 1.4 - 7.0 x10E3/uL   Lymphocytes Absolute 2.3 0.7 - 3.1 x10E3/uL   Monocytes Absolute 0.7 0.1 - 0.9 x10E3/uL   EOS (ABSOLUTE) 0.1 0.0 - 0.4 x10E3/uL   Basophils Absolute 0.1 0.0 - 0.2 x10E3/uL   Immature Granulocytes 0 %   Immature Grans (Abs) 0.0 0.0 - 0.1 x10E3/uL  Iron and TIBC     Status: None   Collection Time: 02/28/16  9:51 AM  Result Value Ref Range   Total Iron Binding Capacity 372 250 - 450 ug/dL   UIBC 302 131 - 425 ug/dL   Iron 70 27 - 159 ug/dL   Iron Saturation 19 15 - 55 %  HIV antibody     Status: None   Collection Time: 02/28/16  9:51 AM  Result Value Ref Range   HIV Screen 4th Generation wRfx Non Reactive Non Reactive  Vitamin B12     Status: None   Collection Time: 02/28/16  9:51 AM  Result Value Ref Range   Vitamin B-12 570 211 - 946 pg/mL     PHQ2/9: Depression screen [facility] 2/9 03/10/2016 02/25/2016  Decreased Interest 0 0  Down, Depressed, Hopeless 0 0  PHQ - 2 Score 0 0    Fall Risk: Fall Risk  03/10/2016 02/25/2016  Falls in the past year? No No    Functional Status Survey: Is the patient deaf or have difficulty hearing?: No Does the patient have difficulty seeing, even when wearing glasses/contacts?: No Does the patient have difficulty concentrating, remembering, or making decisions?: No Does the patient have difficulty walking or climbing stairs?: Yes (due to foot pain) Does the patient have difficulty dressing or bathing?: No Does the patient have difficulty doing errands alone such as visiting a doctor's office or shopping?: No    Assessment & Plan  1. Uncontrolled type 2 diabetes mellitus with foot ulcer, with long-term current use of insulin ([place])  We will give her a sample of Victoza today, discussed possible side effects. No family history of thyroid  carcinoma  2. Hyperlipidemia  She will start medication tomorrow

## 2016-04-10 ENCOUNTER — Ambulatory Visit (INDEPENDENT_AMBULATORY_CARE_PROVIDER_SITE_OTHER): Payer: BLUE CROSS/BLUE SHIELD | Admitting: Family Medicine

## 2016-04-10 ENCOUNTER — Encounter: Payer: Self-pay | Admitting: Family Medicine

## 2016-04-10 VITALS — BP 134/72 | HR 119 | Temp 98.4°F | Resp 18 | Ht 68.0 in | Wt 187.3 lb

## 2016-04-10 DIAGNOSIS — R938 Abnormal findings on diagnostic imaging of other specified body structures: Secondary | ICD-10-CM

## 2016-04-10 DIAGNOSIS — M79672 Pain in left foot: Secondary | ICD-10-CM

## 2016-04-10 DIAGNOSIS — E1165 Type 2 diabetes mellitus with hyperglycemia: Secondary | ICD-10-CM | POA: Diagnosis not present

## 2016-04-10 DIAGNOSIS — Z794 Long term (current) use of insulin: Secondary | ICD-10-CM

## 2016-04-10 DIAGNOSIS — L97509 Non-pressure chronic ulcer of other part of unspecified foot with unspecified severity: Secondary | ICD-10-CM

## 2016-04-10 DIAGNOSIS — Z8614 Personal history of Methicillin resistant Staphylococcus aureus infection: Secondary | ICD-10-CM

## 2016-04-10 DIAGNOSIS — E11621 Type 2 diabetes mellitus with foot ulcer: Secondary | ICD-10-CM

## 2016-04-10 DIAGNOSIS — IMO0002 Reserved for concepts with insufficient information to code with codable children: Secondary | ICD-10-CM

## 2016-04-10 DIAGNOSIS — R9389 Abnormal findings on diagnostic imaging of other specified body structures: Secondary | ICD-10-CM

## 2016-04-10 MED ORDER — METFORMIN HCL 1000 MG PO TABS: 500.0000 mg | ORAL_TABLET | Freq: Two times a day (BID) | ORAL | Status: DC

## 2016-04-10 NOTE — Progress Notes (Signed)
Name: Sarah Duffy   MRN: [id number]    DOB: 04-04-1971   Date:04/10/2016       Progress Note  Subjective  Chief Complaint  Chief Complaint  Patient presents with  . Follow-up    1 month F/U and patient is scheduled for Foot Surgery due to a left torn ligament tear and went to Orthopedics and had MRI and X-Ray's done.   . Diabetes    Doing well on Victoza, checks 3x daily, Low-88 Average-200's High-441. Trying to eat more fish and chicken and stay away from process food.   . Hyperlipidemia    Muscle cramps in legs  . Foot Pain    Going to [facility] to see Dr. Tammy Sours for torn liagments in her left ankle and would like to discuss results.     HPI  DM with left foot ulcer: hospitalized at [facility] 02/03/2016 for evaluation of a foot ulcer ( started after she fell and had a blister on left foot ), her hgbA1C was very high during admission 13.9%, had debridement of lesion of left foot it was positive for MRSA, she is off wound vac now and also off antibiotics. She has been seeing vascular surgeon and podiatrist. This week had x-ray that showed fracture of 5 th toe and possible osteitis versus osteomyelitis. Ulceration is closing on left 5 th toe, but still has pain , applying dressing at home by herself. Started on Doxy yesterday for possible infection.  She states glucose is better, but still spikes to 300's, average around 160's fasting. Compliant with medications. States polyphagia has improved and is not as tired as she used to.    Patient Active Problem List   Diagnosis Date Noted  . Hyperlipidemia 03/10/2016  . MRSA infection within last 3 months 02/25/2016  . MI (mitral incompetence) 02/25/2016  . HLD (hyperlipidemia) 02/25/2016  . Foot pain 02/25/2016  . Insomnia 02/25/2016  . Uncontrolled type 2 diabetes mellitus with foot ulcer ([place]) 02/03/2016    Past Surgical History  Procedure Laterality Date  . Cholecystectomy  1999  . Toe surgery Left 02/07/2016    Pinky Toe    Family  History  Problem Relation Age of Onset  . Diabetes Mother   . Heart disease Father   . Heart disease Sister     Social History   Social History  . Marital Status: Single    Spouse Name: N/A  . Number of Children: N/A  . Years of Education: N/A   Occupational History  . Not on file.   Social History Main Topics  . Smoking status: Never Smoker   . Smokeless tobacco: Never Used  . Alcohol Use: No  . Drug Use: No  . Sexual Activity:    Partners: Male    Birth Control/ Protection: Injection   Other Topics Concern  . Not on file   Social History Narrative     Current outpatient prescriptions:  .  aspirin EC 81 MG tablet, Take 1 tablet (81 mg total) by mouth daily., Disp: 30 tablet, Rfl: 0 .  atorvastatin (LIPITOR) 40 MG tablet, Take 1 tablet (40 mg total) by mouth daily., Disp: 90 tablet, Rfl: 1 .  BAYER CONTOUR NEXT TEST test strip, , Disp: , Rfl: 0 .  doxycycline (VIBRA-TABS) 100 MG tablet, Take 100 mg by mouth., Disp: , Rfl:  .  Insulin Pen Needle (UNIFINE PENTIPS) 31G X 5 MM MISC, by Does not apply route., Disp: , Rfl:  .  LANTUS SOLOSTAR 100 UNIT/ML  Solostar Pen, , Disp: , Rfl: 12 .  Liraglutide (VICTOZA) 18 MG/3ML SOPN, Inject 0.3 mLs (1.8 mg total) into the skin daily., Disp: 18 mL, Rfl: 0 .  metFORMIN (GLUCOPHAGE) 1000 MG tablet, Take 0.5 tablets (500 mg total) by mouth 2 (two) times daily with a meal., Disp: 180 tablet, Rfl: 0 .  Multiple Vitamin (MULTIVITAMIN) capsule, Take by mouth., Disp: , Rfl:  .  TRUEPLUS LANCETS 28G MISC, , Disp: , Rfl: 9  No Known Allergies   ROS  Ten systems reviewed and is negative except as mentioned in HPI   Objective  Filed Vitals:   04/10/16 1125  BP: 134/72  Pulse: 119  Temp: 98.4 F (36.9 C)  TempSrc: Oral  Resp: 18  Height: 5\' 8"  (1.727 m)  Weight: 187 lb 4.8 oz (84.959 kg)  SpO2: 97%    Body mass index is 28.49 kg/(m^2).  Physical Exam Constitutional: Patient appears well-developed and well-nourished.  No  distress.  HEENT: head atraumatic, normocephalic, pupils equal and reactive to light,neck supple, throat within normal limits Cardiovascular: Normal rate, regular rhythm and normal heart sounds.  No murmur heard. No BLE edema. Pulmonary/Chest: Effort normal and breath sounds normal. No respiratory distress. Abdominal: Soft.  There is no tenderness. Psychiatric: Patient has a normal mood and affect. behavior is normal. Judgment and thought content normal. Skin: surgical wound still open on left toe with some swelling surrounding the area, no erythema  Recent Results (from the past 2160 hour(s))  CBC with Differential     Status: Abnormal   Collection Time: 01/31/16 10:12 PM  Result Value Ref Range   WBC 10.6 3.6 - 11.0 K/uL   RBC 4.21 3.80 - 5.20 MIL/uL   Hemoglobin 12.1 12.0 - 16.0 g/dL   HCT 02/02/16 (L) 39.1 - 29.9 %   MCV 81.5 80.0 - 100.0 fL   MCH 28.8 26.0 - 34.0 pg   MCHC 35.4 32.0 - 36.0 g/dL   RDW 02.0 57.9 - 34.1 %   Platelets 287 150 - 440 K/uL   Neutrophils Relative % 73 %   Neutro Abs 7.9 (H) 1.4 - 6.5 K/uL   Lymphocytes Relative 18 %   Lymphs Abs 1.9 1.0 - 3.6 K/uL   Monocytes Relative 7 %   Monocytes Absolute 0.7 0.2 - 0.9 K/uL   Eosinophils Relative 1 %   Eosinophils Absolute 0.1 0 - 0.7 K/uL   Basophils Relative 1 %   Basophils Absolute 0.1 0 - 0.1 K/uL  Comprehensive metabolic panel     Status: Abnormal   Collection Time: 01/31/16 10:12 PM  Result Value Ref Range   Sodium 133 (L) 135 - 145 mmol/L   Potassium 3.8 3.5 - 5.1 mmol/L   Chloride 102 101 - 111 mmol/L   CO2 23 22 - 32 mmol/L   Glucose, Bld 362 (H) 65 - 99 mg/dL   BUN 11 6 - 20 mg/dL   Creatinine, Ser 02/02/16 0.44 - 1.00 mg/dL   Calcium 8.8 (L) 8.9 - 10.3 mg/dL   Total Protein 7.8 6.5 - 8.1 g/dL   Albumin 3.6 3.5 - 5.0 g/dL   AST 15 15 - 41 U/L   ALT 18 14 - 54 U/L   Alkaline Phosphatase 78 38 - 126 U/L   Total Bilirubin 1.0 0.3 - 1.2 mg/dL   GFR calc non Af Amer >60 >60 mL/min   GFR calc Af Amer >60  >60 mL/min    Comment: (NOTE) The eGFR has been calculated using  the CKD EPI equation. This calculation has not been validated in all clinical situations. eGFR's persistently <60 mL/min signify possible Chronic Kidney Disease.    Anion gap 8 5 - 15  Lipid panel     Status: Abnormal   Collection Time: 02/28/16  9:51 AM  Result Value Ref Range   Cholesterol, Total 208 (H) 100 - 199 mg/dL   Triglycerides 69 0 - 149 mg/dL   HDL 42 >39 mg/dL   VLDL Cholesterol Cal 14 5 - 40 mg/dL   LDL Calculated 152 (H) 0 - 99 mg/dL   Chol/HDL Ratio 5.0 (H) 0.0 - 4.4 ratio units    Comment:                                   T. Chol/HDL Ratio                                             Men  Women                               1/2 Avg.Risk  3.4    3.3                                   Avg.Risk  5.0    4.4                                2X Avg.Risk  9.6    7.1                                3X Avg.Risk 23.4   11.0   Comprehensive metabolic panel     Status: Abnormal   Collection Time: 02/28/16  9:51 AM  Result Value Ref Range   Glucose 276 (H) 65 - 99 mg/dL    Comment: Specimen received in contact with cells. Hemolysis present. GLUC may be decreased and K increased. Clinical correlation indicated.    BUN 11 6 - 24 mg/dL   Creatinine, Ser 0.74 0.57 - 1.00 mg/dL   GFR calc non Af Amer 97 >59 mL/min/1.73   GFR calc Af Amer 112 >59 mL/min/1.73   BUN/Creatinine Ratio 15 9 - 23   Sodium 136 134 - 144 mmol/L   Potassium 4.9 3.5 - 5.2 mmol/L   Chloride 95 (L) 96 - 106 mmol/L   CO2 20 18 - 29 mmol/L   Calcium 9.3 8.7 - 10.2 mg/dL   Total Protein 7.6 6.0 - 8.5 g/dL   Albumin 3.9 3.5 - 5.5 g/dL   Globulin, Total 3.7 1.5 - 4.5 g/dL   Albumin/Globulin Ratio 1.1 (L) 1.2 - 2.2    Comment:               **Please note reference interval change**   Bilirubin Total 0.4 0.0 - 1.2 mg/dL   Alkaline Phosphatase 81 39 - 117 IU/L   AST 19 0 - 40 IU/L   ALT 12 0 - 32 IU/L  CBC with Differential/Platelet     Status:  None   Collection Time:  02/28/16  9:51 AM  Result Value Ref Range   WBC 9.2 3.4 - 10.8 x10E3/uL   RBC 4.38 3.77 - 5.28 x10E6/uL   Hemoglobin 12.1 11.1 - 15.9 g/dL   Hematocrit 36.7 34.0 - 46.6 %   MCV 84 79 - 97 fL   MCH 27.6 26.6 - 33.0 pg   MCHC 33.0 31.5 - 35.7 g/dL   RDW 14.0 12.3 - 15.4 %   Platelets 304 150 - 379 x10E3/uL   Neutrophils 65 %   Lymphs 25 %   Monocytes 7 %   Eos 2 %   Basos 1 %   Neutrophils Absolute 6.0 1.4 - 7.0 x10E3/uL   Lymphocytes Absolute 2.3 0.7 - 3.1 x10E3/uL   Monocytes Absolute 0.7 0.1 - 0.9 x10E3/uL   EOS (ABSOLUTE) 0.1 0.0 - 0.4 x10E3/uL   Basophils Absolute 0.1 0.0 - 0.2 x10E3/uL   Immature Granulocytes 0 %   Immature Grans (Abs) 0.0 0.0 - 0.1 x10E3/uL  Iron and TIBC     Status: None   Collection Time: 02/28/16  9:51 AM  Result Value Ref Range   Total Iron Binding Capacity 372 250 - 450 ug/dL   UIBC 302 131 - 425 ug/dL   Iron 70 27 - 159 ug/dL   Iron Saturation 19 15 - 55 %  HIV antibody     Status: None   Collection Time: 02/28/16  9:51 AM  Result Value Ref Range   HIV Screen 4th Generation wRfx Non Reactive Non Reactive  Vitamin B12     Status: None   Collection Time: 02/28/16  9:51 AM  Result Value Ref Range   Vitamin B-12 570 211 - 946 pg/mL    Diabetic Foot Exam: Diabetic Foot Exam - Simple   Simple Foot Form  Diabetic Foot exam was performed with the following findings:  Yes 04/10/2016 11:48 AM  Visual Inspection  See comments:  Yes  Sensation Testing  See comments:  Yes  Pulse Check  Posterior Tibialis and Dorsalis pulse intact bilaterally:  Yes  Comments  Open wound from surgical procedure ( Feb 2017 )on left toe also failed monofilament test       PHQ2/9: Depression screen [place] 2/9 03/10/2016 02/25/2016  Decreased Interest 0 0  Down, Depressed, Hopeless 0 0  PHQ - 2 Score 0 0     Fall Risk: Fall Risk  03/10/2016 02/25/2016  Falls in the past year? No No    Assessment & Plan  1. Uncontrolled type 2 diabetes  mellitus with foot ulcer, with long-term current use of insulin ([facility])  Glucose is better but still not at goal, increase lantus from 40 to 43 units and continue to titrate up to fasting between 100-140  - metFORMIN (GLUCOPHAGE) 1000 MG tablet; Take 0.5 tablets (500 mg total) by mouth 2 (two) times daily with a meal.  Dispense: 180 tablet; Refill: 1  2. History of MRSA infection  With abnormal x-ray possible osteomyelitis, we will refer her to ID, on Doxy  3. Left foot pain  - Ambulatory referral to Infectious Disease  4. Abnormal x-ray  - Ambulatory referral to Infectious Disease -CBC, sed rate, c-reactive protein and comp panel, possible osteomyelitis

## 2016-05-07 ENCOUNTER — Encounter: Payer: Self-pay | Admitting: Family Medicine

## 2016-05-07 ENCOUNTER — Other Ambulatory Visit: Payer: Self-pay

## 2016-05-07 ENCOUNTER — Ambulatory Visit (INDEPENDENT_AMBULATORY_CARE_PROVIDER_SITE_OTHER): Payer: BLUE CROSS/BLUE SHIELD | Admitting: Family Medicine

## 2016-05-07 VITALS — BP 128/78 | HR 128 | Temp 98.4°F | Resp 18 | Ht 68.0 in | Wt 192.1 lb

## 2016-05-07 DIAGNOSIS — R000 Tachycardia, unspecified: Secondary | ICD-10-CM

## 2016-05-07 DIAGNOSIS — E1165 Type 2 diabetes mellitus with hyperglycemia: Secondary | ICD-10-CM | POA: Diagnosis not present

## 2016-05-07 DIAGNOSIS — L97509 Non-pressure chronic ulcer of other part of unspecified foot with unspecified severity: Secondary | ICD-10-CM

## 2016-05-07 DIAGNOSIS — Z8614 Personal history of Methicillin resistant Staphylococcus aureus infection: Secondary | ICD-10-CM

## 2016-05-07 DIAGNOSIS — E11621 Type 2 diabetes mellitus with foot ulcer: Secondary | ICD-10-CM

## 2016-05-07 DIAGNOSIS — E785 Hyperlipidemia, unspecified: Secondary | ICD-10-CM

## 2016-05-07 DIAGNOSIS — IMO0002 Reserved for concepts with insufficient information to code with codable children: Secondary | ICD-10-CM

## 2016-05-07 DIAGNOSIS — Z794 Long term (current) use of insulin: Secondary | ICD-10-CM | POA: Diagnosis not present

## 2016-05-07 DIAGNOSIS — G4700 Insomnia, unspecified: Secondary | ICD-10-CM

## 2016-05-07 LAB — GLUCOSE, POCT (MANUAL RESULT ENTRY): POC GLUCOSE: 205 mg/dL — AB (ref 70–99)

## 2016-05-07 LAB — POCT GLYCOSYLATED HEMOGLOBIN (HGB A1C): HEMOGLOBIN A1C: 10

## 2016-05-07 MED ORDER — ACCU-CHEK AVIVA CONNECT W/DEVICE KIT: 1.0000 | PACK | Freq: Every day | Status: DC

## 2016-05-07 MED ORDER — ACCU-CHEK SOFT TOUCH LANCETS MISC: Status: DC

## 2016-05-07 MED ORDER — GLUCOSE BLOOD VI STRP: ORAL_STRIP | Status: DC

## 2016-05-07 MED ORDER — TRAZODONE HCL 50 MG PO TABS: 25.0000 mg | ORAL_TABLET | Freq: Every evening | ORAL | Status: DC | PRN

## 2016-05-07 MED ORDER — INSULIN DEGLUDEC-LIRAGLUTIDE 100-3.6 UNIT-MG/ML ~~LOC~~ SOPN: 50.0000 [IU] | PEN_INJECTOR | Freq: Every day | SUBCUTANEOUS | Status: DC

## 2016-05-07 MED ORDER — DAPAGLIFLOZIN PRO-METFORMIN ER 10-1000 MG PO TB24: 1.0000 | ORAL_TABLET | Freq: Every day | ORAL | Status: DC

## 2016-05-07 NOTE — Telephone Encounter (Signed)
[facility] states patient is in need of the Lancets prescription, please send to her pharmacy. Thanks.

## 2016-05-07 NOTE — Progress Notes (Signed)
Name: Sarah Duffy   MRN: [id number]    DOB: 1971-07-04   Date:05/07/2016       Progress Note  Subjective  Chief Complaint  Chief Complaint  Patient presents with  . Follow-up    1 month  . Diabetes    Added Metformin on last visit and increasing Lantus to 40-42 units and blood sugar is doing better. Checking 4-5 x daily and Low-71 Average-200's High-415  . Foot Pain    Patient is going to Infectious Disease on June 7 or 9 at [facility], and foot pain is improving with antibiotic Dr. Tammy Sours     HPI  DM with left foot ulcer: hospitalized at [facility] 02/03/2016 for evaluation of a foot ulcer ( started after she fell and had a blister on left foot ), her hgbA1C was very high during admission 13.9%, had debridement of lesion of left foot it was positive for MRSA, she is off wound vac now and also off antibiotics. She has been seeing vascular surgeon and podiatrist. She was seen end of April  x-ray that showed fracture of 5 th toe and possible osteitis versus osteomyelitis. She had a bone biopsy on May 3rd, 2017, the pathology report did not show infection but she was given another round of antibiotics. She states glucose is better, but still spikes to 400's/ she is not sure of the glucose average.  Compliant with medications. States polyphagia has improved and is not as tired as she used to, she has occasional polydipsia but has polyuria. We will adjust medications, to increase compliance combine insulin and Victoza in one injection daily and change from Metformin to [place] to decrease glucose level - discussed possible side effects  Tachycardia: no fever, glucose in our office was not low. She denies palpitation. No SOB. BP is normal   Insomnia: she has difficulty falling and staying asleep, wakes up feeling tired    Patient Active Problem List   Diagnosis Date Noted  . Hyperlipidemia 03/10/2016  . MRSA infection within last 3 months 02/25/2016  . MI (mitral incompetence) 02/25/2016  . Foot  pain 02/25/2016  . Insomnia 02/25/2016  . Uncontrolled type 2 diabetes mellitus with foot ulcer ([place]) 02/03/2016    Past Surgical History  Procedure Laterality Date  . Cholecystectomy  1999  . Toe surgery Left 02/07/2016    Pinky Toe    Family History  Problem Relation Age of Onset  . Diabetes Mother   . Heart disease Father   . Heart disease Sister     Social History   Social History  . Marital Status: Single    Spouse Name: N/A  . Number of Children: N/A  . Years of Education: N/A   Occupational History  . Not on file.   Social History Main Topics  . Smoking status: Never Smoker   . Smokeless tobacco: Never Used  . Alcohol Use: No  . Drug Use: No  . Sexual Activity:    Partners: Male    Birth Control/ Protection: Injection   Other Topics Concern  . Not on file   Social History Narrative     Current outpatient prescriptions:  .  aspirin EC 81 MG tablet, Take 1 tablet (81 mg total) by mouth daily., Disp: 30 tablet, Rfl: 0 .  atorvastatin (LIPITOR) 40 MG tablet, Take 1 tablet (40 mg total) by mouth daily., Disp: 90 tablet, Rfl: 1 .  Insulin Pen Needle (UNIFINE PENTIPS) 31G X 5 MM MISC, by Does not apply route., Disp: ,  Rfl:  .  metFORMIN (GLUCOPHAGE) 500 MG tablet, , Disp: , Rfl: 2 .  Multiple Vitamin (MULTIVITAMIN) capsule, Take by mouth., Disp: , Rfl:  .  Blood Glucose Monitoring Suppl (ACCU-CHEK AVIVA CONNECT) w/Device KIT, 1 kit by Does not apply route daily., Disp: 1 kit, Rfl: 0 .  glucose blood (ACCU-CHEK AVIVA) test strip, Use as instructed  - four to five times daily, Disp: 100 each, Rfl: 5 .  Insulin Degludec-Liraglutide (XULTOPHY) 100-3.6 UNIT-MG/ML SOPN, Inject 50 Units into the skin daily. In place of Lantus and Victoza, Disp: 5 pen, Rfl: 2  No Known Allergies   ROS  Constitutional: Negative for fever, positive for weight change.  Respiratory: Negative for cough and shortness of breath.   Cardiovascular: Negative for chest pain or  palpitations.  Gastrointestinal: Negative for abdominal pain, no bowel changes.  Musculoskeletal: Positive  for gait problem , no  joint swelling.  Skin: Negative for rash.  Neurological: Negative for dizziness or headache.  No other specific complaints in a complete review of systems (except as listed in HPI above).  Objective  Filed Vitals:   05/07/16 0958  BP: 128/78  Pulse: 128  Temp: 98.4 F (36.9 C)  TempSrc: Oral  Resp: 18  Height: 5' 8"  (1.727 m)  Weight: 192 lb 1.6 oz (87.136 kg)  SpO2: 98%    Body mass index is 29.22 kg/(m^2).  Physical Exam  Constitutional: Patient appears well-developed and well-nourished. Obese  No distress.  HEENT: head atraumatic, normocephalic, pupils equal and reactive to light,neck supple, throat within normal limits Cardiovascular: Tachycardia, regular rhythm and normal heart sounds.  No murmur heard. No BLE edema. Pulmonary/Chest: Effort normal and breath sounds normal. No respiratory distress. Abdominal: Soft.  There is no tenderness. Psychiatric: Patient has a normal mood and affect. behavior is normal. Judgment and thought content normal. Skin: very small open wound on left 5th toe, no oozing, healing well  Recent Results (from the past 2160 hour(s))  Lipid panel     Status: Abnormal   Collection Time: 02/28/16  9:51 AM  Result Value Ref Range   Cholesterol, Total 208 (H) 100 - 199 mg/dL   Triglycerides 69 0 - 149 mg/dL   HDL 42 >39 mg/dL   VLDL Cholesterol Cal 14 5 - 40 mg/dL   LDL Calculated 152 (H) 0 - 99 mg/dL   Chol/HDL Ratio 5.0 (H) 0.0 - 4.4 ratio units    Comment:                                   T. Chol/HDL Ratio                                             Men  Women                               1/2 Avg.Risk  3.4    3.3                                   Avg.Risk  5.0    4.4  2X Avg.Risk  9.6    7.1                                3X Avg.Risk 23.4   11.0   Comprehensive metabolic panel      Status: Abnormal   Collection Time: 02/28/16  9:51 AM  Result Value Ref Range   Glucose 276 (H) 65 - 99 mg/dL    Comment: Specimen received in contact with cells. Hemolysis present. GLUC may be decreased and K increased. Clinical correlation indicated.    BUN 11 6 - 24 mg/dL   Creatinine, Ser 0.74 0.57 - 1.00 mg/dL   GFR calc non Af Amer 97 >59 mL/min/1.73   GFR calc Af Amer 112 >59 mL/min/1.73   BUN/Creatinine Ratio 15 9 - 23   Sodium 136 134 - 144 mmol/L   Potassium 4.9 3.5 - 5.2 mmol/L   Chloride 95 (L) 96 - 106 mmol/L   CO2 20 18 - 29 mmol/L   Calcium 9.3 8.7 - 10.2 mg/dL   Total Protein 7.6 6.0 - 8.5 g/dL   Albumin 3.9 3.5 - 5.5 g/dL   Globulin, Total 3.7 1.5 - 4.5 g/dL   Albumin/Globulin Ratio 1.1 (L) 1.2 - 2.2    Comment:               **Please note reference interval change**   Bilirubin Total 0.4 0.0 - 1.2 mg/dL   Alkaline Phosphatase 81 39 - 117 IU/L   AST 19 0 - 40 IU/L   ALT 12 0 - 32 IU/L  CBC with Differential/Platelet     Status: None   Collection Time: 02/28/16  9:51 AM  Result Value Ref Range   WBC 9.2 3.4 - 10.8 x10E3/uL   RBC 4.38 3.77 - 5.28 x10E6/uL   Hemoglobin 12.1 11.1 - 15.9 g/dL   Hematocrit 36.7 34.0 - 46.6 %   MCV 84 79 - 97 fL   MCH 27.6 26.6 - 33.0 pg   MCHC 33.0 31.5 - 35.7 g/dL   RDW 14.0 12.3 - 15.4 %   Platelets 304 150 - 379 x10E3/uL   Neutrophils 65 %   Lymphs 25 %   Monocytes 7 %   Eos 2 %   Basos 1 %   Neutrophils Absolute 6.0 1.4 - 7.0 x10E3/uL   Lymphocytes Absolute 2.3 0.7 - 3.1 x10E3/uL   Monocytes Absolute 0.7 0.1 - 0.9 x10E3/uL   EOS (ABSOLUTE) 0.1 0.0 - 0.4 x10E3/uL   Basophils Absolute 0.1 0.0 - 0.2 x10E3/uL   Immature Granulocytes 0 %   Immature Grans (Abs) 0.0 0.0 - 0.1 x10E3/uL  Iron and TIBC     Status: None   Collection Time: 02/28/16  9:51 AM  Result Value Ref Range   Total Iron Binding Capacity 372 250 - 450 ug/dL   UIBC 302 131 - 425 ug/dL   Iron 70 27 - 159 ug/dL   Iron Saturation 19 15 - 55 %  HIV  antibody     Status: None   Collection Time: 02/28/16  9:51 AM  Result Value Ref Range   HIV Screen 4th Generation wRfx Non Reactive Non Reactive  Vitamin B12     Status: None   Collection Time: 02/28/16  9:51 AM  Result Value Ref Range   Vitamin B-12 570 211 - 946 pg/mL     PHQ2/9: Depression screen [facility] 2/9 03/10/2016 02/25/2016  Decreased Interest 0 0  Down, Depressed, Hopeless 0 0  PHQ - 2 Score 0 0     Fall Risk: Fall Risk  03/10/2016 02/25/2016  Falls in the past year? No No     Assessment & Plan  1. Uncontrolled type 2 diabetes mellitus with foot ulcer, with long-term current use of insulin (HCC)  - Insulin Degludec-Liraglutide (XULTOPHY) 100-3.6 UNIT-MG/ML SOPN; Inject 50 Units into the skin daily. In place of Lantus and Victoza  Dispense: 5 pen; Refill: 2 - Blood Glucose Monitoring Suppl (ACCU-CHEK AVIVA CONNECT) w/Device KIT; 1 kit by Does not apply route daily.  Dispense: 1 kit; Refill: 0 - glucose blood (ACCU-CHEK AVIVA) test strip; Use as instructed  - four to five times daily  Dispense: 100 each; Refill: 5 - POCT Glucose (CBG) - POCT glycosylated hemoglobin (Hb A1C) - Dapagliflozin-Metformin HCl ER (XIGDUO XR) 09-999 MG TB24; Take 1 tablet by mouth daily.  Dispense: 30 tablet; Refill: 2  2. Hyperlipidemia  Continue medication  3. MRSA infection within last 3 months  Last dose of antibiotic is tonight  4. Insomnia  We will try Trazodone  - traZODone (DESYREL) 50 MG tablet; Take 0.5-1 tablets (25-50 mg total) by mouth at bedtime as needed for sleep.  Dispense: 30 tablet; Refill: 0  5. Tachycardia  - CBC with Differential/Platelet - TSH - EKG 12-Lead - sinus tachycardia

## 2016-05-08 LAB — CBC WITH DIFFERENTIAL/PLATELET
BASOS: 0 %
Basophils Absolute: 0 10*3/uL (ref 0.0–0.2)
EOS (ABSOLUTE): 0.1 10*3/uL (ref 0.0–0.4)
EOS: 1 %
HEMATOCRIT: 36.7 % (ref 34.0–46.6)
HEMOGLOBIN: 12.3 g/dL (ref 11.1–15.9)
IMMATURE GRANS (ABS): 0 10*3/uL (ref 0.0–0.1)
Immature Granulocytes: 0 %
LYMPHS ABS: 2.3 10*3/uL (ref 0.7–3.1)
LYMPHS: 25 %
MCH: 27.4 pg (ref 26.6–33.0)
MCHC: 33.5 g/dL (ref 31.5–35.7)
MCV: 82 fL (ref 79–97)
MONOCYTES: 6 %
Monocytes Absolute: 0.6 10*3/uL (ref 0.1–0.9)
NEUTROS ABS: 6 10*3/uL (ref 1.4–7.0)
Neutrophils: 68 %
Platelets: 277 10*3/uL (ref 150–379)
RBC: 4.49 x10E6/uL (ref 3.77–5.28)
RDW: 15.3 % (ref 12.3–15.4)
WBC: 8.9 10*3/uL (ref 3.4–10.8)

## 2016-05-08 LAB — TSH: TSH: 1.92 u[IU]/mL (ref 0.450–4.500)

## 2016-05-12 ENCOUNTER — Telehealth: Payer: Self-pay

## 2016-05-12 MED ORDER — CANAGLIFLOZIN-METFORMIN HCL ER 150-500 MG PO TB24: 2.0000 | ORAL_TABLET | Freq: Every day | ORAL | Status: DC

## 2016-05-12 NOTE — Telephone Encounter (Signed)
Patient will not cover Xigduo, please switch to Invokamet.

## 2016-05-12 NOTE — Addendum Note (Signed)
Addended by: Alba CorySOWLES, Asiya Cutbirth F on: 05/12/2016 08:06 PM   Modules accepted: Orders, Medications

## 2016-05-19 ENCOUNTER — Telehealth: Payer: Self-pay

## 2016-05-19 NOTE — Telephone Encounter (Signed)
Already done

## 2016-05-19 NOTE — Telephone Encounter (Signed)
Insurance wants patient to try Invokamet first before paying for Xigduo. Please switch for coverage.

## 2016-05-21 ENCOUNTER — Encounter: Payer: Self-pay | Admitting: Family Medicine

## 2016-05-21 DIAGNOSIS — E113491 Type 2 diabetes mellitus with severe nonproliferative diabetic retinopathy without macular edema, right eye: Secondary | ICD-10-CM | POA: Insufficient documentation

## 2016-05-21 DIAGNOSIS — E113412 Type 2 diabetes mellitus with severe nonproliferative diabetic retinopathy with macular edema, left eye: Secondary | ICD-10-CM | POA: Insufficient documentation

## 2016-06-03 LAB — HM DIABETES EYE EXAM

## 2016-06-18 ENCOUNTER — Other Ambulatory Visit: Payer: Self-pay | Admitting: Family Medicine

## 2016-06-19 ENCOUNTER — Encounter: Payer: Self-pay | Admitting: Family Medicine

## 2016-06-19 ENCOUNTER — Telehealth: Payer: Self-pay

## 2016-06-19 ENCOUNTER — Ambulatory Visit (INDEPENDENT_AMBULATORY_CARE_PROVIDER_SITE_OTHER): Payer: BLUE CROSS/BLUE SHIELD | Admitting: Family Medicine

## 2016-06-19 VITALS — BP 146/86 | HR 128 | Temp 98.6°F | Resp 18 | Ht 68.0 in | Wt 187.7 lb

## 2016-06-19 DIAGNOSIS — M545 Low back pain, unspecified: Secondary | ICD-10-CM

## 2016-06-19 DIAGNOSIS — E1165 Type 2 diabetes mellitus with hyperglycemia: Secondary | ICD-10-CM

## 2016-06-19 DIAGNOSIS — Z794 Long term (current) use of insulin: Secondary | ICD-10-CM

## 2016-06-19 DIAGNOSIS — E113491 Type 2 diabetes mellitus with severe nonproliferative diabetic retinopathy without macular edema, right eye: Secondary | ICD-10-CM

## 2016-06-19 DIAGNOSIS — R000 Tachycardia, unspecified: Secondary | ICD-10-CM | POA: Diagnosis not present

## 2016-06-19 DIAGNOSIS — IMO0002 Reserved for concepts with insufficient information to code with codable children: Secondary | ICD-10-CM

## 2016-06-19 DIAGNOSIS — Z8614 Personal history of Methicillin resistant Staphylococcus aureus infection: Secondary | ICD-10-CM | POA: Diagnosis not present

## 2016-06-19 DIAGNOSIS — L97509 Non-pressure chronic ulcer of other part of unspecified foot with unspecified severity: Secondary | ICD-10-CM | POA: Diagnosis not present

## 2016-06-19 DIAGNOSIS — E11621 Type 2 diabetes mellitus with foot ulcer: Secondary | ICD-10-CM | POA: Diagnosis not present

## 2016-06-19 MED ORDER — DICLOFENAC SODIUM 1 % TD GEL: 4.0000 g | Freq: Four times a day (QID) | TRANSDERMAL | Status: DC

## 2016-06-19 NOTE — Progress Notes (Signed)
Name: Sarah Duffy   MRN: [id number]    DOB: 04/21/1971   Date:06/19/2016       Progress Note  Subjective  Chief Complaint  Chief Complaint  Patient presents with  . Follow-up    6 week F/U, patient   . Diabetes    Last visit started Invokamet and Xultophy on last visit, checks BS 4-5 daily High-130-145 Average-110-120 Low-53 (woke up about 5 a.m. and was sweaty-drank a OJ) and Patient is drinking more water and cutting back on bread, and trying to quit [employer]  . Hip Pain    Right Hip pain due to patient wearing boot on her left foot, patient states it is a throbbing and shooting pain constant for the past month. Patient would like to talk about getting a strong pain cream to rub on her hip.    HPI   DM with left foot ulcer: hospitalized at [facility] 02/03/2016 for evaluation of a foot ulcer ( started after she fell and had a blister on left foot ), her hgbA1C was very high during admission 13.9%, had debridement of lesion of left foot it was positive for MRSA, she is off wound vac but is back on triple antibiotic since 06/23 per ID - Dr. Luiz Ochoa because of inflammatory markers still being high. She has been seeing vascular surgeon and podiatrist. She was seen end of April x-ray that showed fracture of 5 th toe and possible osteitis versus osteomyelitis. She had a bone biopsy on May 3rd, 2017. She has also been diagnosed with diabetic retinopathy and is having bilateral eye injections.  We switched to [facility] for the past month and she has noticed improvement of glucose. Average 110-120 and she is on 40 units at this time. She has changed her diet, decreasing bread intake and is drinking sparkling water instead of sodas. She has also noticed improvement in polyphagia , has occasional polydipsia but has polyuria. Continue current regiment, may not need to go up on dose of Xultophy since glucose seems to be at goal   Tachycardia: no fever, glucose in our office was not low. She denies palpitation.  No SOB. BP is normal . Last labs showed normal CBC and TSH. She states she was rushing when she came in  Low back pain/hip pain: she states that for the past couple of months she has noticed pain on right lower back but is getting progressively worse and radiating to right outer hip. She wears a boot on the left foot and her gait is not stable. I will order PT for the right side   Patient Active Problem List   Diagnosis Date Noted  . Diabetic retinopathy of left eye, with macular edema, with severe nonproliferative retinopathy, associated with type 2 diabetes mellitus (HCC) 05/21/2016  . Diabetic retinopathy of right eye, without macular edema, with severe nonproliferative retinopathy, associated with type 2 diabetes mellitus (HCC) 05/21/2016  . Hyperlipidemia 03/10/2016  . MRSA infection within last 3 months 02/25/2016  . MI (mitral incompetence) 02/25/2016  . Foot pain 02/25/2016  . Insomnia 02/25/2016  . Uncontrolled type 2 diabetes mellitus with foot ulcer (HCC) 02/03/2016    Past Surgical History  Procedure Laterality Date  . Cholecystectomy  1999  . Toe surgery Left 02/07/2016    Pinky Toe    Family History  Problem Relation Age of Onset  . Diabetes Mother   . Heart disease Father   . Heart disease Sister     Social History   Social  History  . Marital Status: Single    Spouse Name: N/A  . Number of Children: N/A  . Years of Education: N/A   Occupational History  . Not on file.   Social History Main Topics  . Smoking status: Never Smoker   . Smokeless tobacco: Never Used  . Alcohol Use: No  . Drug Use: No  . Sexual Activity:    Partners: Male    Birth Control/ Protection: Injection   Other Topics Concern  . Not on file   Social History Narrative     Current outpatient prescriptions:  .  aspirin EC 81 MG tablet, Take 1 tablet (81 mg total) by mouth daily., Disp: 30 tablet, Rfl: 0 .  atorvastatin (LIPITOR) 40 MG tablet, Take 1 tablet (40 mg total) by  mouth daily., Disp: 90 tablet, Rfl: 1 .  Blood Glucose Monitoring Suppl (ACCU-CHEK AVIVA CONNECT) w/Device KIT, 1 kit by Does not apply route daily., Disp: 1 kit, Rfl: 0 .  Canagliflozin-Metformin HCl ER (INVOKAMET XR) 150-500 MG TB24, Take 2 tablets by mouth daily., Disp: 60 tablet, Rfl: 1 .  glucose blood (ACCU-CHEK AVIVA) test strip, Use as instructed  - four to five times daily, Disp: 100 each, Rfl: 5 .  Insulin Degludec-Liraglutide (XULTOPHY) 100-3.6 UNIT-MG/ML SOPN, Inject 50 Units into the skin daily. In place of Lantus and Victoza, Disp: 5 pen, Rfl: 2 .  Insulin Pen Needle (UNIFINE PENTIPS) 31G X 5 MM MISC, by Does not apply route., Disp: , Rfl:  .  Lancets (ACCU-CHEK SOFT TOUCH) lancets, Use as instructed, Disp: 100 each, Rfl: 12 .  levofloxacin (LEVAQUIN) 750 MG tablet, Take 750 mg by mouth daily., Disp: , Rfl:  .  metroNIDAZOLE (FLAGYL) 500 MG tablet, Take 500 mg by mouth 3 (three) times daily., Disp: , Rfl:  .  Multiple Vitamin (MULTIVITAMIN) capsule, Take by mouth., Disp: , Rfl:  .  sulfamethoxazole-trimethoprim (BACTRIM DS) 800-160 MG tablet, Take 1 tablet by mouth 2 (two) times daily., Disp: , Rfl:  .  traZODone (DESYREL) 50 MG tablet, Take 0.5-1 tablets (25-50 mg total) by mouth at bedtime as needed for sleep., Disp: 30 tablet, Rfl: 0  No Known Allergies   ROS  Ten systems reviewed and is negative except as mentioned in HPI   Objective  Filed Vitals:   06/19/16 1203  BP: 146/86  Pulse: 128  Temp: 98.6 F (37 C)  TempSrc: Oral  Resp: 18  Height: _0  (1.727 m)  Weight: 187 lb 11.2 oz (85.14 kg)  SpO2: 96%    Body mass index is 28.55 kg/(m^2).  Physical Exam  Constitutional: Patient appears well-developed and well-nourished. Obese  No distress.  HEENT: head atraumatic, normocephalic, pupils equal and reactive to light, neck supple, throat within normal limits Cardiovascular: Normal rate, regular rhythm and normal heart sounds.  No murmur heard. No BLE  edema. Pulmonary/Chest: Effort normal and breath sounds normal. No respiratory distress. Abdominal: Soft.  There is no tenderness. Psychiatric: Patient has a normal mood and affect. behavior is normal. Judgment and thought content normal. Muscular skeletal: pain during palpation of right lower back and buttocks, no pain during palpation of trochanteric bursa  Recent Results (from the past 2160 hour(s))  POCT Glucose (CBG)     Status: Abnormal   Collection Time: 05/07/16 10:43 AM  Result Value Ref Range   POC Glucose 205 (A) 70 - 99 mg/dl  POCT glycosylated hemoglobin (Hb A1C)     Status: Abnormal   Collection Time: 05/07/16  11:02 AM  Result Value Ref Range   Hemoglobin A1C 10.0   CBC with Differential/Platelet     Status: None   Collection Time: 05/07/16 11:27 AM  Result Value Ref Range   WBC 8.9 3.4 - 10.8 x10E3/uL   RBC 4.49 3.77 - 5.28 x10E6/uL   Hemoglobin 12.3 11.1 - 15.9 g/dL   Hematocrit 36.7 34.0 - 46.6 %   MCV 82 79 - 97 fL   MCH 27.4 26.6 - 33.0 pg   MCHC 33.5 31.5 - 35.7 g/dL   RDW 15.3 12.3 - 15.4 %   Platelets 277 150 - 379 x10E3/uL   Neutrophils 68 %   Lymphs 25 %   Monocytes 6 %   Eos 1 %   Basos 0 %   Neutrophils Absolute 6.0 1.4 - 7.0 x10E3/uL   Lymphocytes Absolute 2.3 0.7 - 3.1 x10E3/uL   Monocytes Absolute 0.6 0.1 - 0.9 x10E3/uL   EOS (ABSOLUTE) 0.1 0.0 - 0.4 x10E3/uL   Basophils Absolute 0.0 0.0 - 0.2 x10E3/uL   Immature Granulocytes 0 %   Immature Grans (Abs) 0.0 0.0 - 0.1 x10E3/uL  TSH     Status: None   Collection Time: 05/07/16 11:27 AM  Result Value Ref Range   TSH 1.920 0.450 - 4.500 uIU/mL  HM DIABETES EYE EXAM     Status: Abnormal   Collection Time: 06/03/16 12:00 AM  Result Value Ref Range   HM Diabetic Eye Exam Retinopathy (A) No Retinopathy    Comment: Seen Dr. Kerin Ransom and now is going to [facility]- Dr. Royden Purl     PHQ2/9: Depression screen [facility] 2/9 06/19/2016 03/10/2016 02/25/2016  Decreased Interest 0 0 0  Down, Depressed, Hopeless 0 0  0  PHQ - 2 Score 0 0 0     Fall Risk: Fall Risk  06/19/2016 03/10/2016 02/25/2016  Falls in the past year? No No No     Functional Status Survey: Is the patient deaf or have difficulty hearing?: No Does the patient have difficulty seeing, even when wearing glasses/contacts?: No Does the patient have difficulty concentrating, remembering, or making decisions?: No Does the patient have difficulty walking or climbing stairs?: No Does the patient have difficulty dressing or bathing?: No Does the patient have difficulty doing errands alone such as visiting a doctor's office or shopping?: No   Assessment & Plan   1. Uncontrolled type 2 diabetes mellitus with foot ulcer, with long-term current use of insulin ([place])  Doing well now - continue medication and follow up with ID and Podiatrist  2. MRSA infection within last 3 months  Take antibiotics as prescribed, advised to add pro-biotics to decrease risk of c.difi colitis   3. Tachycardia  Normal labs, I will refer to Cardiologist  4. Diabetic retinopathy of right eye, without macular edema, with severe nonproliferative retinopathy, associated with type 2 diabetes mellitus ([place])  Keep follow up with ophthalmologist   5. Right-sided low back pain without sciatica  - Ambulatory referral to Physical Therapy

## 2016-06-19 NOTE — Telephone Encounter (Signed)
Please submit rx to pharmacist for Voltaren gel.

## 2016-06-19 NOTE — Telephone Encounter (Signed)
Patient requesting refill. 

## 2016-06-22 ENCOUNTER — Encounter: Payer: Self-pay | Admitting: Family Medicine

## 2016-06-30 ENCOUNTER — Ambulatory Visit: Payer: BLUE CROSS/BLUE SHIELD

## 2016-07-15 ENCOUNTER — Other Ambulatory Visit: Payer: Self-pay | Admitting: Family Medicine

## 2016-07-15 DIAGNOSIS — E11621 Type 2 diabetes mellitus with foot ulcer: Secondary | ICD-10-CM

## 2016-07-15 DIAGNOSIS — Z794 Long term (current) use of insulin: Principal | ICD-10-CM

## 2016-07-15 DIAGNOSIS — L97509 Non-pressure chronic ulcer of other part of unspecified foot with unspecified severity: Principal | ICD-10-CM

## 2016-07-15 DIAGNOSIS — E1165 Type 2 diabetes mellitus with hyperglycemia: Principal | ICD-10-CM

## 2016-07-15 DIAGNOSIS — IMO0002 Reserved for concepts with insufficient information to code with codable children: Secondary | ICD-10-CM

## 2016-07-16 ENCOUNTER — Encounter: Payer: Self-pay | Admitting: Cardiovascular Disease

## 2016-07-16 ENCOUNTER — Ambulatory Visit (INDEPENDENT_AMBULATORY_CARE_PROVIDER_SITE_OTHER): Payer: BLUE CROSS/BLUE SHIELD | Admitting: Cardiovascular Disease

## 2016-07-16 VITALS — BP 128/80 | HR 80 | Ht 68.0 in | Wt 191.0 lb

## 2016-07-16 DIAGNOSIS — IMO0002 Reserved for concepts with insufficient information to code with codable children: Secondary | ICD-10-CM

## 2016-07-16 DIAGNOSIS — E11621 Type 2 diabetes mellitus with foot ulcer: Secondary | ICD-10-CM

## 2016-07-16 DIAGNOSIS — R000 Tachycardia, unspecified: Secondary | ICD-10-CM

## 2016-07-16 DIAGNOSIS — E785 Hyperlipidemia, unspecified: Secondary | ICD-10-CM | POA: Diagnosis not present

## 2016-07-16 DIAGNOSIS — Z8614 Personal history of Methicillin resistant Staphylococcus aureus infection: Secondary | ICD-10-CM

## 2016-07-16 DIAGNOSIS — I340 Nonrheumatic mitral (valve) insufficiency: Secondary | ICD-10-CM

## 2016-07-16 DIAGNOSIS — E113412 Type 2 diabetes mellitus with severe nonproliferative diabetic retinopathy with macular edema, left eye: Secondary | ICD-10-CM

## 2016-07-16 DIAGNOSIS — R0602 Shortness of breath: Secondary | ICD-10-CM | POA: Diagnosis not present

## 2016-07-16 DIAGNOSIS — L97509 Non-pressure chronic ulcer of other part of unspecified foot with unspecified severity: Secondary | ICD-10-CM

## 2016-07-16 DIAGNOSIS — Z794 Long term (current) use of insulin: Secondary | ICD-10-CM

## 2016-07-16 DIAGNOSIS — E1165 Type 2 diabetes mellitus with hyperglycemia: Secondary | ICD-10-CM

## 2016-07-16 NOTE — Progress Notes (Signed)
Cardiology Office Note  Date:  07/16/2016   ID:  Sarah Duffy, DOB [birth year], MRN [id number]  PCP:  Sarah Chance, MD   Chief Complaint  Patient presents with  . Other    C/o sob. Meds reviewed verbally with pt.    HPI:  Sarah Duffy is a pleasant 44 yo woman with history of poor control diabetes 2, hemoglobin A1c as high as 13.9, diabetic retinopathy, wound of the left foot positive for MRSA requiring wound VAC, triple antibiotics, fracture of the fifth toe, possible osteomyelitis with bone biopsy in May 2017, referred for consultation by Dr. Ancil Duffy for tachycardia and occasional shortness of breath  Patient reports that sheTwisted her ankle 01/2016 And from there she developed a blister beating to infection admitted to [facility],  requiring ABX Foot fracture was seen on follow-up x-rays, possible osteo Wound vac and antibiotics as detailed above  Retinopathy, reports that she Gets eye injections Total chol 208 (before lipitor) No recent lipid panel. Hemoglobin A1c improving down to 10  Previous notes in July 2017 detailing heart rate in the 120 range No EKG performed for review Previous EKG May 2017 shows heart rate 10 2 bpm EKG 07/08/2016 with heart rate 76 bpm EKG on today's visit shows normal sinus rhythm with rate 80 bpm, no significant ST or T-wave changes  In general she reports feeling well with no complaints, denies any tachycardia symptoms. Occasionally will have acute short of breath lasting for just a second, presenting while she is sitting, not on exertion.  She is active, able to do her ADLs without any limitations apart from wearing a boot on her left foot .   Was told that she had funny sound in her heart in the past   echocardiogram in 2014 showing normal ejection fraction, moderate MR (read by outside cardiologist)   PMH:   has a past medical history of Diabetes mellitus without complication ([place]).  PSH:    Past Surgical History:  Procedure Laterality Date   . CHOLECYSTECTOMY  1999  . TOE SURGERY Left 02/07/2016   Pinky Toe    Current Outpatient Prescriptions  Medication Sig Dispense Refill  . aspirin EC 81 MG tablet Take 1 tablet (81 mg total) by mouth daily. 30 tablet 0  . atorvastatin (LIPITOR) 40 MG tablet Take 1 tablet (40 mg total) by mouth daily. 90 tablet 1  . Blood Glucose Monitoring Suppl (ACCU-CHEK AVIVA CONNECT) w/Device KIT 1 kit by Does not apply route daily. 1 kit 0  . Canagliflozin-Metformin HCl ER (INVOKAMET XR) 150-500 MG TB24 Take 2 tablets by mouth daily. 60 tablet 1  . diclofenac sodium (VOLTAREN) 1 % GEL Apply 4 g topically 4 (four) times daily. 100 g 2  . glucose blood (ACCU-CHEK AVIVA) test strip Use as instructed  - four to five times daily 100 each 5  . Insulin Pen Needle (UNIFINE PENTIPS) 31G X 5 MM MISC by Does not apply route.    . Lancets (ACCU-CHEK SOFT TOUCH) lancets Use as instructed 100 each 12  . Multiple Vitamin (MULTIVITAMIN) capsule Take by mouth.    . traZODone (DESYREL) 50 MG tablet TAKE 1/2 TO 1 TABLET(25 TO 50 MG) BY MOUTH AT BEDTIME AS NEEDED FOR SLEEP 30 tablet 1  . XULTOPHY 100-3.6 UNIT-MG/ML SOPN INJECT 50 UNITS INTO THE SKIN DAILY. IN PLACE OF LANTUS AND VICTOZA 9 mL 0   No current facility-administered medications for this visit.      Allergies:   Review of patient's allergies  indicates no known allergies.   Social History:  The patient  reports that she has never smoked. She has never used smokeless tobacco. She reports that she does not drink alcohol or use drugs.   Family History:   family history includes AAA (abdominal aortic aneurysm) in her father; Diabetes in her mother; Heart attack in her sister; Heart disease in her father.    Review of Systems: Review of Systems  Constitutional: Negative.   Respiratory: Negative.        Rare episodes of having to catch her breath, typically presents at rest  Cardiovascular: Negative.   Gastrointestinal: Negative.   Musculoskeletal:  Negative.   Neurological: Negative.   Psychiatric/Behavioral: Negative.   All other systems reviewed and are negative.    PHYSICAL EXAM: VS:  BP 128/80 (BP Location: Left Arm, Patient Position: Sitting, Cuff Size: Normal)   Pulse 80   Ht 5' 8"  (1.727 m)   Wt 191 lb (86.6 kg)   BMI 29.04 kg/m  , BMI Body mass index is 29.04 kg/m. GEN: Well nourished, well developed, in no acute distress  HEENT: normal  Neck: no JVD, carotid bruits, or masses Cardiac: RRR; no murmurs, rubs, or gallops,no edema  Respiratory:  clear to auscultation bilaterally, normal work of breathing GI: soft, nontender, nondistended, + BS MS: no deformity or atrophy ,  boot on her left foot up to below the knee  Skin: warm and dry, no rash Neuro:  Strength and sensation are intact Psych: euthymic mood, full affect    Recent Labs: 01/31/2016: Hemoglobin 12.1 02/28/2016: ALT 12; BUN 11; Creatinine, Ser 0.74; Potassium 4.9; Sodium 136 05/07/2016: Platelets 277; TSH 1.920    Lipid Panel Lab Results  Component Value Date   CHOL 208 (H) 02/28/2016   HDL 42 02/28/2016   LDLCALC 152 (H) 02/28/2016   TRIG 69 02/28/2016    Labwork above prior to starting Lipitor   Wt Readings from Last 3 Encounters:  07/16/16 191 lb (86.6 kg)  06/19/16 187 lb 11.2 oz (85.1 kg)  05/07/16 192 lb 1.6 oz (87.1 kg)       ASSESSMENT AND PLAN:  SOB (shortness of breath) - Plan: EKG 12-Lead Atypical symptoms, lasting just a second, presenting at rest Otherwise no symptoms on exertion EKG, clinical exam essentially benign No history of smoking. Likely of little clinical significance No further testing needed at this time  Tachycardia Previous tachycardia documented on office visits On today's visit, EKG 1 week ago, EKG in May heart rate was normal She is unaware of any tachycardia Discussed various ways we could monitor her heart rate. As she is doing well, we'll hold off on a Holter monitor. Suggested she use her phone with  pulse meter to track her heart rate Certainly possible she had elevated heart rate in the setting of pain or infection  Hyperlipidemia Recommended she stay on her Lipitor daily  Uncontrolled type 2 diabetes mellitus with foot ulcer, with long-term current use of insulin ([place]) Long discussion concerning her diabetes. Recommended strict low carbohydrate diet, close follow-up with endocrinology and Dr. Ancil Duffy   MRSA infection within last 3 months Reports her foot is healing  Likely exacerbated by poorly controlled diabetes   Mitral valve disease Despite previous echocardiogram in 2014 detailing moderate MR, no murmur appreciated on today's visit. Likely overestimation of valve regurgitation. No indication for repeat echocardiogram at this time unless new clinical symptoms develop    Total encounter time more than 45 minutes  Greater than  50% was spent in counseling and coordination of care with the patient    Disposition:   F/U  as needed    Orders Placed This Encounter  Procedures  . EKG 12-Lead     Signed, Esmond Plants, M.D., Ph.D. 07/16/2016  [place], [place]

## 2016-07-16 NOTE — Patient Instructions (Signed)
Medication Instructions:   N new medication changes  Labwork:  No new labs  Testing/Procedures:  No new testing   Follow-Up: It was a pleasure seeing you in the office today. Please call us if you have new issues that need to be addressed before your next appt.  [phone]  Your physician wants you to follow-up in: as needed   If you need a refill on your cardiac medications before your next appointment, please call your pharmacy.

## 2016-07-19 ENCOUNTER — Other Ambulatory Visit: Payer: Self-pay | Admitting: Family Medicine

## 2016-07-20 NOTE — Telephone Encounter (Signed)
Patient [facility] of [id number] [place] [place].

## 2016-07-22 ENCOUNTER — Encounter: Payer: Self-pay | Admitting: Family Medicine

## 2016-07-28 ENCOUNTER — Other Ambulatory Visit: Payer: Self-pay | Admitting: Family Medicine

## 2016-07-28 NOTE — Telephone Encounter (Signed)
Patient requesting refill of Invokamet while we are waiting for the appeal of Xigduo. Miel faxed the letter to Insurance on Friday.

## 2016-07-30 ENCOUNTER — Encounter: Payer: Self-pay | Admitting: Family Medicine

## 2016-07-31 ENCOUNTER — Ambulatory Visit (INDEPENDENT_AMBULATORY_CARE_PROVIDER_SITE_OTHER): Payer: BLUE CROSS/BLUE SHIELD | Admitting: Family Medicine

## 2016-07-31 ENCOUNTER — Encounter: Payer: Self-pay | Admitting: Family Medicine

## 2016-07-31 VITALS — BP 128/74 | HR 123 | Temp 99.0°F | Resp 18 | Ht 68.0 in | Wt 190.7 lb

## 2016-07-31 DIAGNOSIS — R000 Tachycardia, unspecified: Secondary | ICD-10-CM

## 2016-07-31 DIAGNOSIS — Z8614 Personal history of Methicillin resistant Staphylococcus aureus infection: Secondary | ICD-10-CM | POA: Diagnosis not present

## 2016-07-31 DIAGNOSIS — Z79899 Other long term (current) drug therapy: Secondary | ICD-10-CM | POA: Diagnosis not present

## 2016-07-31 DIAGNOSIS — Z794 Long term (current) use of insulin: Secondary | ICD-10-CM | POA: Diagnosis not present

## 2016-07-31 DIAGNOSIS — E1165 Type 2 diabetes mellitus with hyperglycemia: Secondary | ICD-10-CM

## 2016-07-31 DIAGNOSIS — L97509 Non-pressure chronic ulcer of other part of unspecified foot with unspecified severity: Secondary | ICD-10-CM | POA: Diagnosis not present

## 2016-07-31 DIAGNOSIS — E113491 Type 2 diabetes mellitus with severe nonproliferative diabetic retinopathy without macular edema, right eye: Secondary | ICD-10-CM

## 2016-07-31 DIAGNOSIS — IMO0002 Reserved for concepts with insufficient information to code with codable children: Secondary | ICD-10-CM

## 2016-07-31 DIAGNOSIS — E11621 Type 2 diabetes mellitus with foot ulcer: Secondary | ICD-10-CM

## 2016-07-31 DIAGNOSIS — E785 Hyperlipidemia, unspecified: Secondary | ICD-10-CM

## 2016-07-31 DIAGNOSIS — G4700 Insomnia, unspecified: Secondary | ICD-10-CM | POA: Diagnosis not present

## 2016-07-31 MED ORDER — INSULIN ASPART 100 UNIT/ML FLEXPEN: 5.0000 [IU] | PEN_INJECTOR | Freq: Three times a day (TID) | SUBCUTANEOUS | 2 refills | Status: DC

## 2016-07-31 MED ORDER — ATORVASTATIN CALCIUM 40 MG PO TABS: 40.0000 mg | ORAL_TABLET | Freq: Every day | ORAL | 1 refills | Status: DC

## 2016-07-31 NOTE — Progress Notes (Signed)
Name: Sarah Duffy   MRN: [id number]    DOB: [birth year]   Date:07/31/2016       Progress Note  Subjective  Chief Complaint  Chief Complaint  Patient presents with  . Follow-up    6 week F/U   . Diabetes    Patient goes back to ID and Podiatry both at the end of the month. ID put patient on the 3 regimen antibiotics again and kept in the boot.     HPI  DM with left foot ulcer: hospitalized at [facility] 02/03/2016 for evaluation of a foot ulcer ( started after she fell and had a blister on left foot ), her hgbA1C was very high during admission 13.9%, had debridement of lesion of left foot it was positive for MRSA, she is off wound vac but is back on triple antibiotic since 06/23 per ID - Dr. Rosario Adie because of inflammatory markers still being high. She has been seeing vascular surgeon and podiatrist. She was seen end of April x-ray that showed fracture of 5 th toe and possible osteitis versus osteomyelitis. She had a bone biopsy on May 3rd, 2017. She has also been diagnosed with diabetic retinopathy and is having bilateral eye injections.  We switched to Xultophy in May and glucose was improving, however over the past 2 months living with relatives or friends and has no income so it has been difficulty to eat healthy.  Reminded her to stop at Xultophy 50 units daily and add pre-meal insulin. Also discussed importance of switching from Invokamet to [place] because of increase risk of amputation. She is still avoiding bread, avoiding sodas. Drinking more water. She met her deductible and medication is mostly free. Glucose has been in the 230's-240's. Highest 265 post-prandially. 190's fasting  Tachycardia: no fever, glucose in our office was not low. She denies palpitation. No SOB. BP is normal . Last labs showed normal CBC and TSH. She was seen by Dr. Rockey Situ and was given reassurance  Insomnia: not currently taking Trazodone at this time, she is living with friends at this time and does not like taking  Trazodone.   Hyperlipidemia: taking statin and denies muscle pain or chest pain  Patient Active Problem List   Diagnosis Date Noted  . Diabetic retinopathy of left eye, with macular edema, with severe nonproliferative retinopathy, associated with type 2 diabetes mellitus ([place]) 05/21/2016  . Diabetic retinopathy of right eye, without macular edema, with severe nonproliferative retinopathy, associated with type 2 diabetes mellitus ([place]) 05/21/2016  . Hyperlipidemia 03/10/2016  . MRSA infection within last 3 months 02/25/2016  . MI (mitral incompetence) 02/25/2016  . Foot pain 02/25/2016  . Insomnia 02/25/2016  . Uncontrolled type 2 diabetes mellitus with foot ulcer ([place]) 02/03/2016    Past Surgical History:  Procedure Laterality Date  . CHOLECYSTECTOMY  1999  . TOE SURGERY Left 02/07/2016   Pinky Toe    Family History  Problem Relation Age of Onset  . Diabetes Mother   . Heart disease Father   . AAA (abdominal aortic aneurysm) Father   . Heart attack Sister     Social History   Social History  . Marital status: Single    Spouse name: N/A  . Number of children: N/A  . Years of education: N/A   Occupational History  . Not on file.   Social History Main Topics  . Smoking status: Never Smoker  . Smokeless tobacco: Never Used  . Alcohol use No  . Drug use: No  .  Sexual activity: Yes    Partners: Male    Birth control/ protection: Injection   Other Topics Concern  . Not on file   Social History Narrative   She used to work at [employer], but since Feb 2017 she has not been able to work secondary to MRSA infection and uncontrolled DM. She is seeing podiatrist and ID at [facility] and is still wearing a boot.    Homeless now, living with friends and family because she is unable to work.      Current Outpatient Prescriptions:  .  aspirin EC 81 MG tablet, Take 1 tablet (81 mg total) by mouth daily., Disp: 30 tablet, Rfl: 0 .  atorvastatin (LIPITOR) 40 MG tablet, Take 1  tablet (40 mg total) by mouth daily., Disp: 90 tablet, Rfl: 1 .  Blood Glucose Monitoring Suppl (ACCU-CHEK AVIVA CONNECT) w/Device KIT, 1 kit by Does not apply route daily., Disp: 1 kit, Rfl: 0 .  diclofenac sodium (VOLTAREN) 1 % GEL, Apply 4 g topically 4 (four) times daily., Disp: 100 g, Rfl: 2 .  glucose blood (ACCU-CHEK AVIVA) test strip, Use as instructed  - four to five times daily, Disp: 100 each, Rfl: 5 .  Insulin Pen Needle (UNIFINE PENTIPS) 31G X 5 MM MISC, by Does not apply route., Disp: , Rfl:  .  INVOKAMET XR 150-500 MG TB24, TAKE 2 TABLETS BY MOUTH DAILY( IN PLACE OF XIGDUO), Disp: 60 tablet, Rfl: 0 .  Lancets (ACCU-CHEK SOFT TOUCH) lancets, Use as instructed, Disp: 100 each, Rfl: 12 .  Multiple Vitamin (MULTIVITAMIN) capsule, Take by mouth., Disp: , Rfl:  .  traZODone (DESYREL) 50 MG tablet, TAKE 1/2 TO 1 TABLET(25 TO 50 MG) BY MOUTH AT BEDTIME AS NEEDED FOR SLEEP, Disp: 30 tablet, Rfl: 1 .  XULTOPHY 100-3.6 UNIT-MG/ML SOPN, INJECT 50 UNITS INTO THE SKIN DAILY. IN PLACE OF LANTUS AND VICTOZA, Disp: 9 mL, Rfl: 0 .  insulin aspart (NOVOLOG FLEXPEN) 100 UNIT/ML FlexPen, Inject 5-10 Units into the skin 3 (three) times daily with meals., Disp: 15 mL, Rfl: 2  No Known Allergies   ROS  Constitutional: Negative for fever or weight change.  Respiratory: Negative for cough and shortness of breath.   Cardiovascular: Negative for chest pain or palpitations.  Gastrointestinal: Negative for abdominal pain, no bowel changes.  Musculoskeletal: Positive  for gait problem but no joint swelling.  Skin: Negative for rash.  Neurological: Negative for dizziness or headache.  No other specific complaints in a complete review of systems (except as listed in HPI above).  Objective  Vitals:   07/31/16 1145  BP: 128/74  Pulse: (!) 123  Resp: 18  Temp: 99 F (37.2 C)  TempSrc: Oral  SpO2: 98%  Weight: 190 lb 11.2 oz (86.5 kg)  Height: _0  (1.727 m)    Body mass index is 29  kg/m.  Physical Exam  Constitutional: Patient appears well-developed and well-nourished.  No distress.  HEENT: head atraumatic, normocephalic, pupils equal and reactive to light,  neck supple, throat within normal limits Cardiovascular: Normal rate, regular rhythm and normal heart sounds.  No murmur heard. No BLE edema. Pulmonary/Chest: Effort normal and breath sounds normal. No respiratory distress. Abdominal: Soft.  There is no tenderness. Psychiatric: Patient has a normal mood and affect. behavior is normal. Judgment and thought content normal. Skin: left foot examen showed well healed wound on top of left 5th toe  Recent Results (from the past 2160 hour(s))  POCT Glucose (CBG)  Status: Abnormal   Collection Time: 05/07/16 10:43 AM  Result Value Ref Range   POC Glucose 205 (A) 70 - 99 mg/dl  POCT glycosylated hemoglobin (Hb A1C)     Status: Abnormal   Collection Time: 05/07/16 11:02 AM  Result Value Ref Range   Hemoglobin A1C 10.0   CBC with Differential/Platelet     Status: None   Collection Time: 05/07/16 11:27 AM  Result Value Ref Range   WBC 8.9 3.4 - 10.8 x10E3/uL   RBC 4.49 3.77 - 5.28 x10E6/uL   Hemoglobin 12.3 11.1 - 15.9 g/dL   Hematocrit 36.7 34.0 - 46.6 %   MCV 82 79 - 97 fL   MCH 27.4 26.6 - 33.0 pg   MCHC 33.5 31.5 - 35.7 g/dL   RDW 15.3 12.3 - 15.4 %   Platelets 277 150 - 379 x10E3/uL   Neutrophils 68 %   Lymphs 25 %   Monocytes 6 %   Eos 1 %   Basos 0 %   Neutrophils Absolute 6.0 1.4 - 7.0 x10E3/uL   Lymphocytes Absolute 2.3 0.7 - 3.1 x10E3/uL   Monocytes Absolute 0.6 0.1 - 0.9 x10E3/uL   EOS (ABSOLUTE) 0.1 0.0 - 0.4 x10E3/uL   Basophils Absolute 0.0 0.0 - 0.2 x10E3/uL   Immature Granulocytes 0 %   Immature Grans (Abs) 0.0 0.0 - 0.1 x10E3/uL  TSH     Status: None   Collection Time: 05/07/16 11:27 AM  Result Value Ref Range   TSH 1.920 0.450 - 4.500 uIU/mL  HM DIABETES EYE EXAM     Status: Abnormal   Collection Time: 06/03/16 12:00 AM  Result  Value Ref Range   HM Diabetic Eye Exam Retinopathy (A) No Retinopathy    Comment: Seen Dr. Kerin Ransom and now is going to [facility]- Dr. Royden Purl      PHQ2/9: Depression screen [facility] 2/9 06/19/2016 03/10/2016 02/25/2016  Decreased Interest 0 0 0  Down, Depressed, Hopeless 0 0 0  PHQ - 2 Score 0 0 0     Fall Risk: Fall Risk  06/19/2016 03/10/2016 02/25/2016  Falls in the past year? No No No     Assessment & Plan  1. Uncontrolled type 2 diabetes mellitus with foot ulcer, with long-term current use of insulin ([facility])  - insulin aspart (NOVOLOG FLEXPEN) 100 UNIT/ML FlexPen; Inject 5-10 Units into the skin 3 (three) times daily with meals.  Dispense: 15 mL; Refill: 2 - Hemoglobin A1c  2. Diabetic retinopathy of right eye, without macular edema, with severe nonproliferative retinopathy, associated with type 2 diabetes mellitus ([place])  Keep follow up with eye doctor  3. Hyperlipidemia  - Lipid panel  4. Insomnia  Living with friends and not able to take medication   5. Tachycardia  Seen by Dr. Rockey Situ and reassurance for now  6. HLD (hyperlipidemia)  - atorvastatin (LIPITOR) 40 MG tablet; Take 1 tablet (40 mg total) by mouth daily.  Dispense: 90 tablet; Refill: 1  7. Encounter for long-term (current) use of high-risk medication  - Comprehensive metabolic panel  8. MRSA infection within last 3 months  - Sedimentation rate - C-reactive protein

## 2016-08-04 ENCOUNTER — Ambulatory Visit: Payer: BLUE CROSS/BLUE SHIELD | Admitting: Cardiovascular Disease

## 2016-08-26 DIAGNOSIS — M869 Osteomyelitis, unspecified: Secondary | ICD-10-CM

## 2016-08-26 HISTORY — DX: Osteomyelitis, unspecified: M86.9

## 2016-11-02 ENCOUNTER — Ambulatory Visit: Payer: BLUE CROSS/BLUE SHIELD | Admitting: Family Medicine

## 2016-11-13 ENCOUNTER — Ambulatory Visit: Payer: BLUE CROSS/BLUE SHIELD | Admitting: Family Medicine

## 2017-01-05 ENCOUNTER — Other Ambulatory Visit: Payer: Self-pay | Admitting: Family Medicine

## 2017-01-05 DIAGNOSIS — E785 Hyperlipidemia, unspecified: Secondary | ICD-10-CM

## 2017-01-05 NOTE — Telephone Encounter (Signed)
lvm for patient to schedule appointment

## 2017-01-05 NOTE — Telephone Encounter (Signed)
[facility]

## 2017-01-05 NOTE — Telephone Encounter (Signed)
lvm to make appointment.

## 2017-01-05 NOTE — Telephone Encounter (Signed)
Needs and appointment

## 2017-01-05 NOTE — Telephone Encounter (Signed)
[facility].

## 2017-04-08 IMAGING — CR DG FOOT COMPLETE 3+V*L*
1 series · 3 of 3 positions shown · non-contrast
Comparison: Radiograph dated 12/19/2012

CLINICAL DATA: 44-year-old female with left foot trauma and
swelling.

EXAM:
LEFT FOOT - COMPLETE 3+ VIEW

[Series 1: dg foot complete left · 0.14mm/px · 3 of 3 slices shown]
[im 1/3]
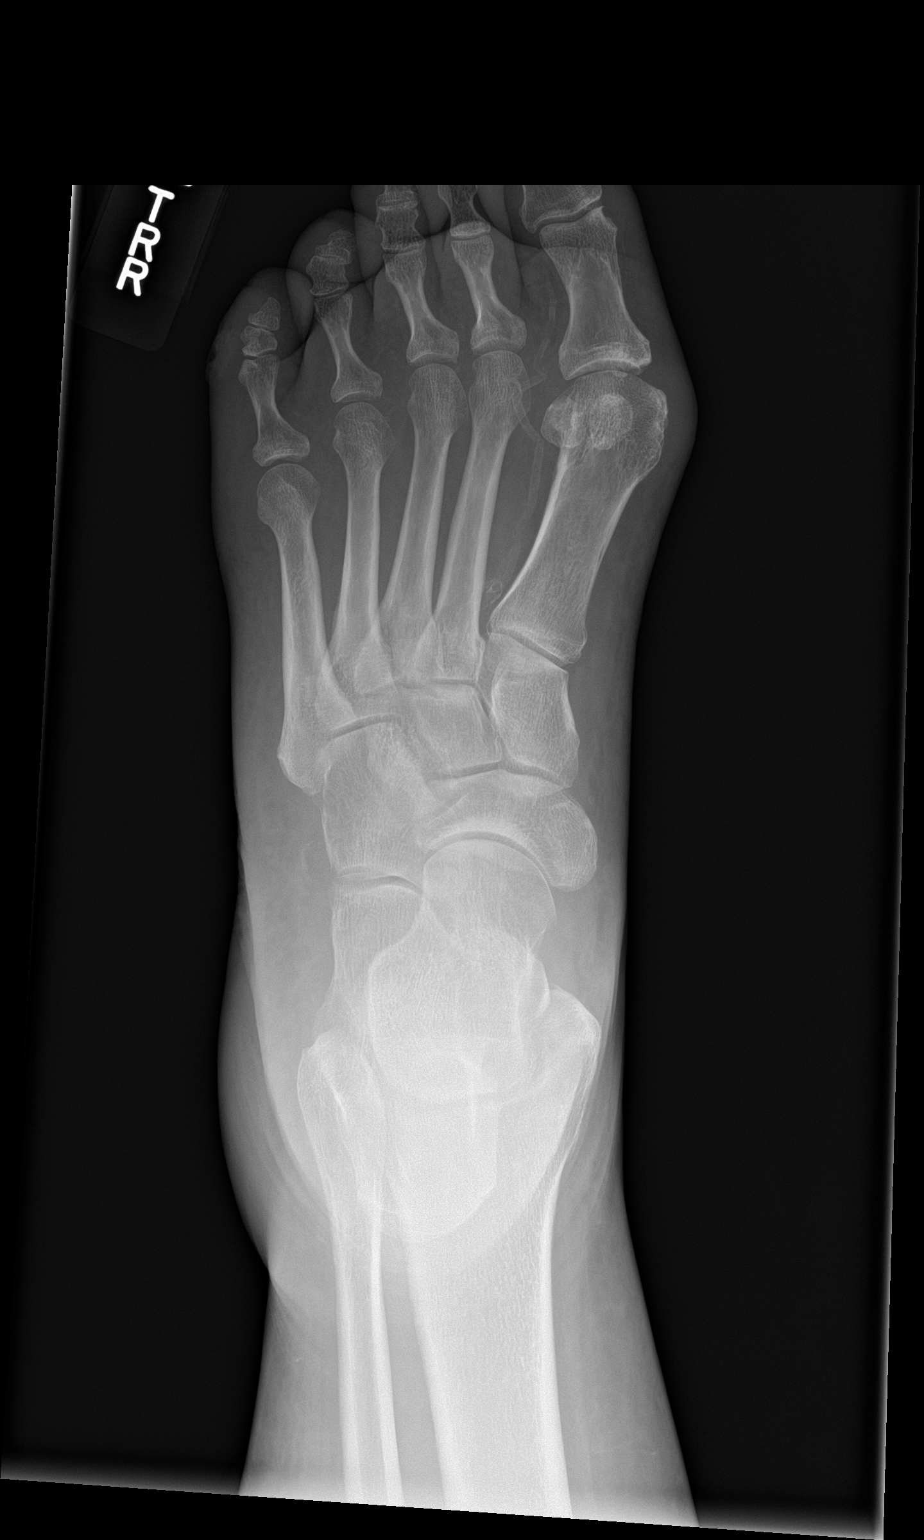
[im 2/3]
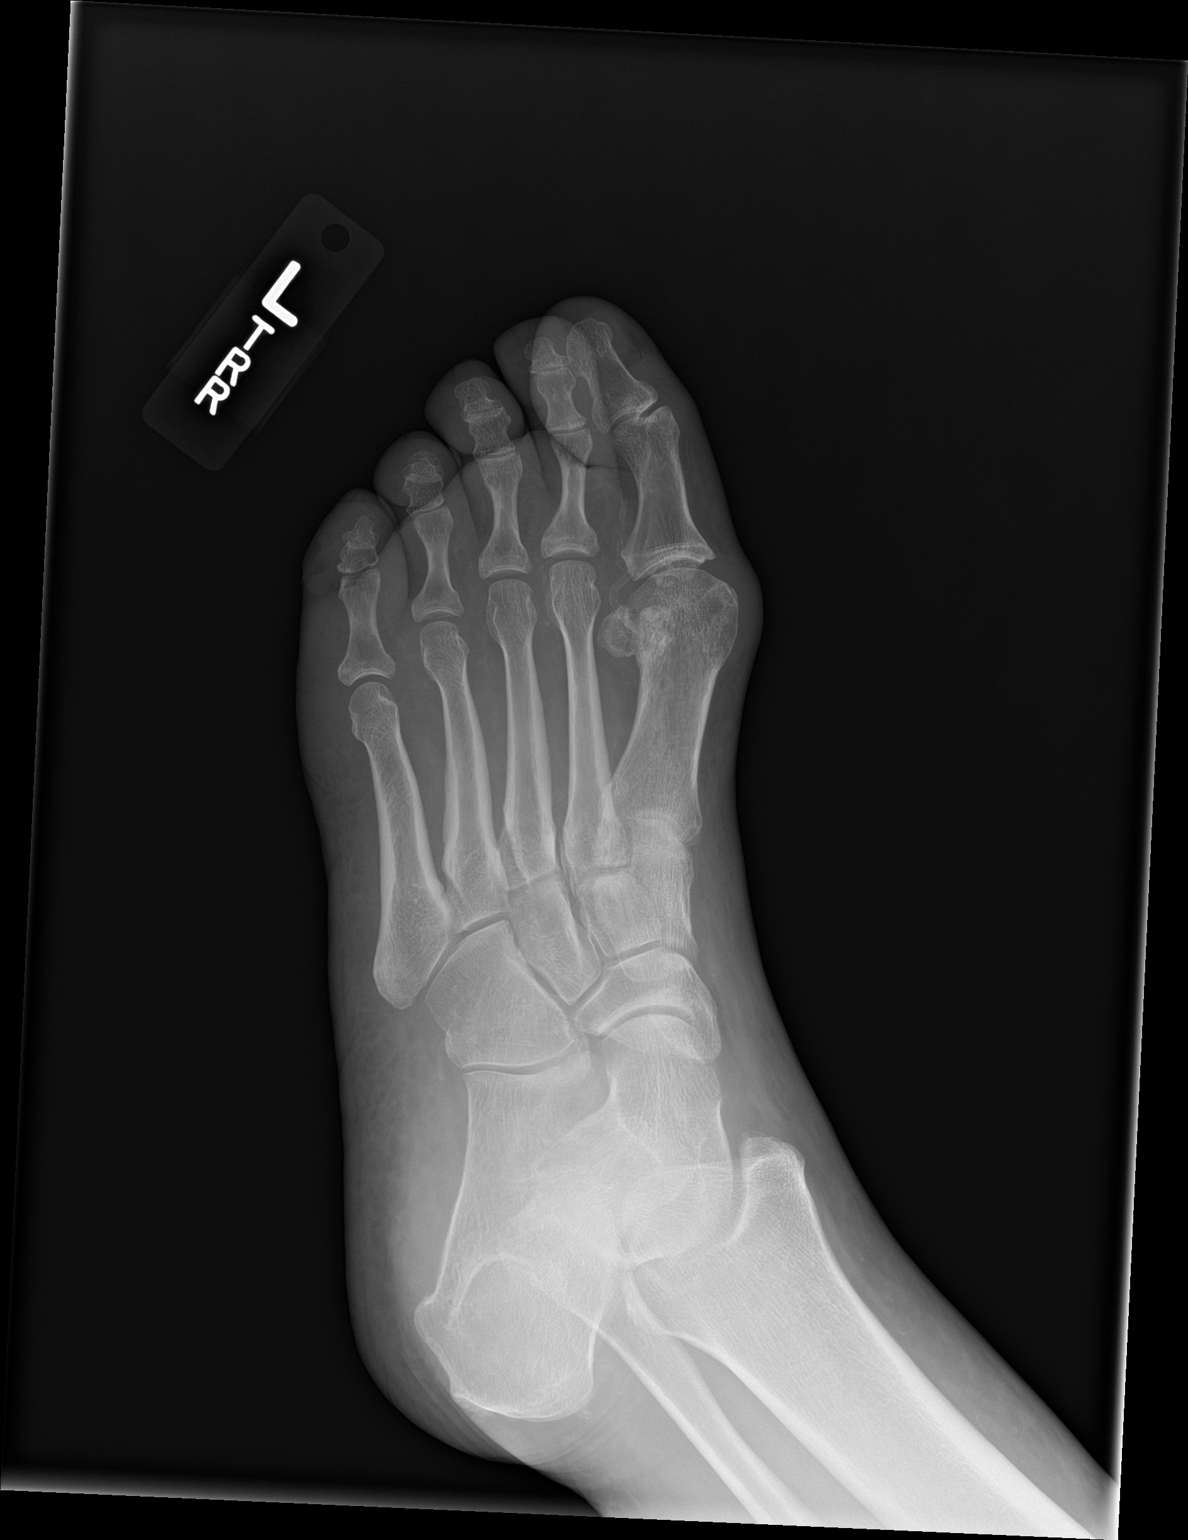
[im 3/3]
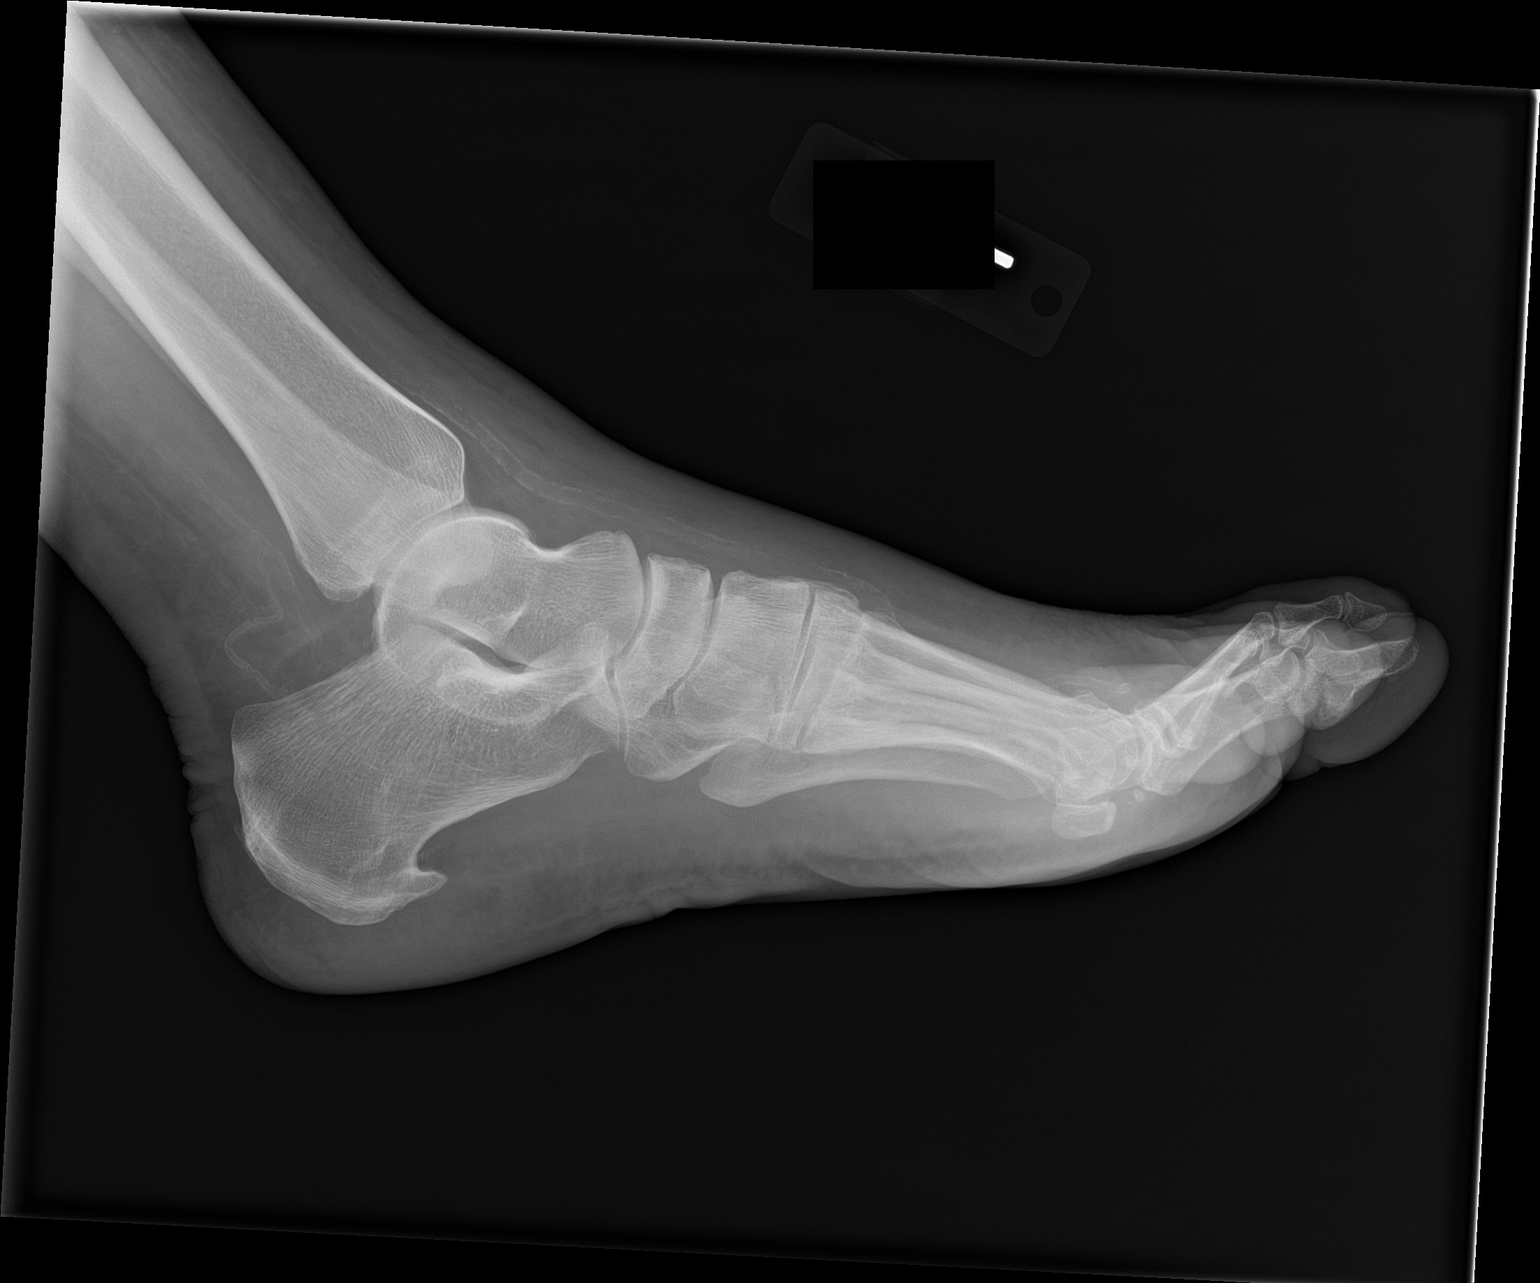

[3 of 3 positions shown; findings below may reference images not displayed]

FINDINGS: There is no acute fracture or dislocation. No arthritic changes. A 5
mm calcaneal spur is noted. There is mild diffuse soft tissue
swelling of the foot. No radiopaque foreign object identified.
IMPRESSION: No acute osseous pathology.

## 2017-05-05 ENCOUNTER — Ambulatory Visit (INDEPENDENT_AMBULATORY_CARE_PROVIDER_SITE_OTHER): Payer: BLUE CROSS/BLUE SHIELD | Admitting: Family Medicine

## 2017-05-05 ENCOUNTER — Encounter: Payer: Self-pay | Admitting: Family Medicine

## 2017-05-05 VITALS — BP 158/82 | HR 101 | Temp 98.2°F | Resp 16 | Ht 68.0 in | Wt 201.2 lb

## 2017-05-05 DIAGNOSIS — IMO0002 Reserved for concepts with insufficient information to code with codable children: Secondary | ICD-10-CM

## 2017-05-05 DIAGNOSIS — E1121 Type 2 diabetes mellitus with diabetic nephropathy: Secondary | ICD-10-CM

## 2017-05-05 DIAGNOSIS — Z8614 Personal history of Methicillin resistant Staphylococcus aureus infection: Secondary | ICD-10-CM

## 2017-05-05 DIAGNOSIS — F330 Major depressive disorder, recurrent, mild: Secondary | ICD-10-CM

## 2017-05-05 DIAGNOSIS — R030 Elevated blood-pressure reading, without diagnosis of hypertension: Secondary | ICD-10-CM

## 2017-05-05 DIAGNOSIS — E782 Mixed hyperlipidemia: Secondary | ICD-10-CM

## 2017-05-05 DIAGNOSIS — Z79899 Other long term (current) drug therapy: Secondary | ICD-10-CM

## 2017-05-05 DIAGNOSIS — E1140 Type 2 diabetes mellitus with diabetic neuropathy, unspecified: Secondary | ICD-10-CM

## 2017-05-05 DIAGNOSIS — G4700 Insomnia, unspecified: Secondary | ICD-10-CM

## 2017-05-05 DIAGNOSIS — E113491 Type 2 diabetes mellitus with severe nonproliferative diabetic retinopathy without macular edema, right eye: Secondary | ICD-10-CM

## 2017-05-05 DIAGNOSIS — E1165 Type 2 diabetes mellitus with hyperglycemia: Secondary | ICD-10-CM

## 2017-05-05 LAB — POCT UA - MICROALBUMIN: MICROALBUMIN (UR) POC: 50 mg/L

## 2017-05-05 LAB — POCT GLYCOSYLATED HEMOGLOBIN (HGB A1C): HEMOGLOBIN A1C: 8.2

## 2017-05-05 MED ORDER — ACCU-CHEK SOFT TOUCH LANCETS MISC: 12 refills | Status: DC

## 2017-05-05 MED ORDER — DULOXETINE HCL 30 MG PO CPEP: 30.0000 mg | ORAL_CAPSULE | Freq: Every day | ORAL | 0 refills | Status: DC

## 2017-05-05 MED ORDER — LOSARTAN POTASSIUM 50 MG PO TABS: 50.0000 mg | ORAL_TABLET | Freq: Every day | ORAL | 1 refills | Status: DC

## 2017-05-05 MED ORDER — TRAZODONE HCL 50 MG PO TABS: 50.0000 mg | ORAL_TABLET | Freq: Every day | ORAL | 1 refills | Status: DC

## 2017-05-05 MED ORDER — ATORVASTATIN CALCIUM 40 MG PO TABS: 40.0000 mg | ORAL_TABLET | Freq: Every day | ORAL | 1 refills | Status: DC

## 2017-05-05 MED ORDER — INSULIN ASPART 100 UNIT/ML FLEXPEN: 5.0000 [IU] | PEN_INJECTOR | Freq: Three times a day (TID) | SUBCUTANEOUS | 2 refills | Status: DC

## 2017-05-05 MED ORDER — GLUCOSE BLOOD VI STRP: ORAL_STRIP | 5 refills | Status: DC

## 2017-05-05 MED ORDER — MEDROXYPROGESTERONE ACETATE 150 MG/ML IM SUSY: 150.0000 mg | PREFILLED_SYRINGE | Freq: Once | INTRAMUSCULAR | 0 refills | Status: AC

## 2017-05-05 MED ORDER — INSULIN DEGLUDEC-LIRAGLUTIDE 100-3.6 UNIT-MG/ML ~~LOC~~ SOPN: 50.0000 [IU] | PEN_INJECTOR | Freq: Every day | SUBCUTANEOUS | 2 refills | Status: DC

## 2017-05-05 MED ORDER — EMPAGLIFLOZIN-METFORMIN HCL ER 5-1000 MG PO TB24: 2.0000 | ORAL_TABLET | Freq: Every day | ORAL | 2 refills | Status: DC

## 2017-05-05 NOTE — Progress Notes (Signed)
Name: Sarah Duffy   MRN: [id number]    DOB: Apr 28, 1971   Date:05/05/2017       Progress Note  Subjective  Chief Complaint  Chief Complaint  Patient presents with  . Hand Pain    both hands can not close hands completely for 2 weeks  . Diabetes  . Hyperlipidemia  . Insomnia  . Hypertension    HPI  DM with left foot ulcer: hospitalized at [facility] 02/03/2016 for evaluation of a foot ulcer ( started after she fell and had a blister on left foot ), her hgbA1C was very high during admission 13.9%, had debridement of lesion of left foot it was positive for MRSA, she is off wound vac but is back on triple antibiotic since 06/23 per ID - Dr. Rosario Adie and later on diagnosed with osteomyelitis of left foot. She was out of work for one year, back since February 09, 2017. She has also been diagnosed with diabetic retinopathy and is having bilateral eye injections in 2016-02-10. She had a gap of insurance and lost to follow up, however has been getting Xultophy and Novolog through [facility], she has been out of oral medication. Since back to work, she has been eating healthier, hgbA1C today was 8.2%. She has daily left foot pain, aching like. No polyphagia, polydipsia or polyuria. She has nocturia - she thinks secondary to insomnia  Tachycardia: She denies palpitation. No SOB.Last labs showed normal CBC and TSH. She was seen by Dr. Rockey Situ and was given reassurance. Today bp is elevated, but no previous history of hypertension   Insomnia:she states Trazodone does not work very well, but also states mind wanders at night, worried about paying bills and what to do with the following day.   Depression: There is a family history of suicide ( brother ), she struggled after her brother died in 02/09/11. He was 51 yo. She was doing well, but over the past year, she has struggled because of lack of employment and medical problems. She denies anhedonia, able to take care of herself. She denies suicidal thoughts and  ideation  Hyperlipidemia: taking statin and denies muscle pain or chest pain   Patient Active Problem List   Diagnosis Date Noted  . Osteomyelitis of foot ([place]) 08/26/2016  . Diabetic retinopathy of left eye, with macular edema, with severe nonproliferative retinopathy, associated with type 2 diabetes mellitus ([place]) 05/21/2016  . Diabetic retinopathy of right eye, without macular edema, with severe nonproliferative retinopathy, associated with type 2 diabetes mellitus ([place]) 05/21/2016  . Hyperlipidemia 03/10/2016  . MRSA infection within last 3 months 02/25/2016  . MI (mitral incompetence) 02/25/2016  . Foot pain 02/25/2016  . Insomnia 02/25/2016  . Uncontrolled type 2 diabetes mellitus with foot ulcer ([place]) 02/03/2016    Past Surgical History:  Procedure Laterality Date  . CHOLECYSTECTOMY  1999  . TOE SURGERY Left 02/07/2016   Pinky Toe    Family History  Problem Relation Age of Onset  . Diabetes Mother   . Heart disease Father   . AAA (abdominal aortic aneurysm) Father   . Heart attack Sister     Social History   Social History  . Marital status: Single    Spouse name: N/A  . Number of children: N/A  . Years of education: N/A   Occupational History  . Not on file.   Social History Main Topics  . Smoking status: Never Smoker  . Smokeless tobacco: Never Used  . Alcohol use No  . Drug use: No  .  Sexual activity: Yes    Partners: Male    Birth control/ protection: Injection   Other Topics Concern  . Not on file   Social History Narrative   She used to work at [employer], but  Feb 2017 she left work because of  MRSA infection.osteomyelitis and uncontrolled DM. She has been back to work since Feb 2018   Still living with friends     Current Outpatient Prescriptions:  .  aspirin EC 81 MG tablet, Take 1 tablet (81 mg total) by mouth daily., Disp: 30 tablet, Rfl: 0 .  atorvastatin (LIPITOR) 40 MG tablet, Take 1 tablet (40 mg total) by mouth daily., Disp: 90  tablet, Rfl: 1 .  Blood Glucose Monitoring Suppl (ACCU-CHEK AVIVA CONNECT) w/Device KIT, 1 kit by Does not apply route daily., Disp: 1 kit, Rfl: 0 .  diclofenac sodium (VOLTAREN) 1 % GEL, Apply 4 g topically 4 (four) times daily., Disp: 100 g, Rfl: 2 .  DULoxetine (CYMBALTA) 30 MG capsule, Take 1-2 capsules (30-60 mg total) by mouth daily., Disp: 60 capsule, Rfl: 0 .  Empagliflozin-Metformin HCl ER (SYNJARDY XR) 04-999 MG TB24, Take 2 tablets by mouth daily., Disp: 60 tablet, Rfl: 2 .  glucose blood (ACCU-CHEK AVIVA) test strip, Use as instructed  - four to five times daily, Disp: 100 each, Rfl: 5 .  insulin aspart (NOVOLOG FLEXPEN) 100 UNIT/ML FlexPen, Inject 5-10 Units into the skin 3 (three) times daily with meals., Disp: 15 mL, Rfl: 2 .  Insulin Degludec-Liraglutide (XULTOPHY) 100-3.6 UNIT-MG/ML SOPN, Inject 50 Units into the skin daily. In place of Lantus and Victoza, Disp: 9 mL, Rfl: 2 .  Insulin Pen Needle (UNIFINE PENTIPS) 31G X 5 MM MISC, by Does not apply route., Disp: , Rfl:  .  Lancets (ACCU-CHEK SOFT TOUCH) lancets, Use as instructed, Disp: 100 each, Rfl: 12 .  MedroxyPROGESTERone Acetate 150 MG/ML SUSY, Inject 1 mL (150 mg total) into the muscle once., Disp: 0.9 mL, Rfl: 0 .  Multiple Vitamin (MULTIVITAMIN) capsule, Take by mouth., Disp: , Rfl:  .  traZODone (DESYREL) 50 MG tablet, Take 1 tablet (50 mg total) by mouth at bedtime., Disp: 90 tablet, Rfl: 1  No Known Allergies   ROS  Constitutional: Negative for fever, positive for weight change ( gain ).  Respiratory: Negative for cough and shortness of breath.    Cardiovascular: Negative for chest pain or palpitations.  Gastrointestinal: Negative for abdominal pain, no bowel changes.  Musculoskeletal: Negative for gait problem or joint swelling.  Skin: Negative for rash.  Neurological: Negative for dizziness or headache.  No other specific complaints in a complete review of systems (except as listed in HPI  above).  Objective  Vitals:   05/05/17 0806  BP: (!) 158/82  Pulse: (!) 101  Resp: 16  Temp: 98.2 F (36.8 C)  SpO2: 96%  Weight: 201 lb 3 oz (91.3 kg)  Height: 5' 8"  (1.727 m)    Body mass index is 30.59 kg/m.  Physical Exam  Constitutional: Patient appears well-developed and well-nourished. Obese No distress.  HEENT: head atraumatic, normocephalic, pupils equal and reactive to light,  neck supple, throat within normal limits Cardiovascular: Normal rate, regular rhythm and normal heart sounds.  No murmur heard. No BLE edema. Pulmonary/Chest: Effort normal and breath sounds normal. No respiratory distress. Abdominal: Soft.  There is no tenderness. Muscular skeletal: some dry and cracked skin on top of left small toe, no sensation with monofilament Psychiatric: Patient has a normal mood and  affect. behavior is normal. Judgment and thought content normal.  Diabetic Foot Exam: Diabetic Foot Exam - Simple   Simple Foot Form Diabetic Foot exam was performed with the following findings:  Yes 05/05/2017  8:28 AM  Visual Inspection See comments:  Yes Sensation Testing See comments:  Yes Pulse Check Posterior Tibialis and Dorsalis pulse intact bilaterally:  Yes Comments Failed monofilament test on both feet Dryness and cracked skin on left 5 th toe      PHQ2/9: Depression screen [facility] 2/9 06/19/2016 03/10/2016 02/25/2016  Decreased Interest 0 0 0  Down, Depressed, Hopeless 0 0 0  PHQ - 2 Score 0 0 0     Fall Risk: Fall Risk  06/19/2016 03/10/2016 02/25/2016  Falls in the past year? No No No      Assessment & Plan  1. Uncontrolled type 2 diabetes with neuropathy ([place])  She lost to follow up secondary to lack of insurance, but has been using Xultophy and Novolog she has been out of oral medication. We will switch from Invokamet to Synjardi and may decrease Novolog to 5 mg before meals - insulin aspart (NOVOLOG FLEXPEN) 100 UNIT/ML FlexPen; Inject 5-10 Units into the skin 3  (three) times daily with meals.  Dispense: 15 mL; Refill: 2 - Insulin Degludec-Liraglutide (XULTOPHY) 100-3.6 UNIT-MG/ML SOPN; Inject 50 Units into the skin daily. In place of Lantus and Victoza  Dispense: 9 mL; Refill: 2 - Empagliflozin-Metformin HCl ER (SYNJARDY XR) 04-999 MG TB24; Take 2 tablets by mouth daily.  Dispense: 60 tablet; Refill: 2 - Lancets (ACCU-CHEK SOFT TOUCH) lancets; Use as instructed  Dispense: 100 each; Refill: 12 - glucose blood (ACCU-CHEK AVIVA) test strip; Use as instructed  - four to five times daily  Dispense: 100 each; Refill: 5 - DULoxetine (CYMBALTA) 30 MG capsule; Take 1-2 capsules (30-60 mg total) by mouth daily.  Dispense: 60 capsule; Refill: 0 - POCT glycosylated hemoglobin (Hb A1C) - POCT UA - Microalbumin  2. Diabetic retinopathy of right eye, without macular edema, with severe nonproliferative retinopathy, associated with type 2 diabetes mellitus ([place])  Due for eye exam 05/2017  3. Mixed hyperlipidemia  - atorvastatin (LIPITOR) 40 MG tablet; Take 1 tablet (40 mg total) by mouth daily.  Dispense: 90 tablet; Refill: 1 - Lipid panel  4. History of MRSA infection  Resolved, osteomyelitis of left foot dx 01/2016  5. Depression, major, recurrent, mild (HCC)  - DULoxetine (CYMBALTA) 30 MG capsule; Take 1-2 capsules (30-60 mg total) by mouth daily.  Dispense: 60 capsule; Refill: 0  6. Insomnia, unspecified type   - traZODone (DESYREL) 50 MG tablet; Take 1 tablet (50 mg total) by mouth at bedtime.  Dispense: 90 tablet; Refill: 1  7. Long-term use of high-risk medication  - CBC with Differential/Platelet - COMPLETE METABOLIC PANEL WITH GFR - Vitamin B12   8. Elevated BP without diagnosis of hypertension  She left before we rechecked bp   9. 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

## 2017-05-22 ENCOUNTER — Other Ambulatory Visit: Payer: Self-pay | Admitting: Family Medicine

## 2017-05-22 DIAGNOSIS — E1129 Type 2 diabetes mellitus with other diabetic kidney complication: Secondary | ICD-10-CM | POA: Insufficient documentation

## 2017-05-22 DIAGNOSIS — E119 Type 2 diabetes mellitus without complications: Secondary | ICD-10-CM | POA: Insufficient documentation

## 2017-05-22 DIAGNOSIS — R809 Proteinuria, unspecified: Principal | ICD-10-CM

## 2017-05-23 ENCOUNTER — Encounter: Payer: Self-pay | Admitting: Emergency Medicine

## 2017-05-23 ENCOUNTER — Emergency Department
Admission: EM | Admit: 2017-05-23 | Discharge: 2017-05-23 | Disposition: A | Discharge status: Home / Self Care | Payer: BLUE CROSS/BLUE SHIELD | Attending: Emergency Medicine | Admitting: Emergency Medicine

## 2017-05-23 DIAGNOSIS — M79642 Pain in left hand: Secondary | ICD-10-CM | POA: Insufficient documentation

## 2017-05-23 DIAGNOSIS — M7989 Other specified soft tissue disorders: Secondary | ICD-10-CM | POA: Insufficient documentation

## 2017-05-23 DIAGNOSIS — M79641 Pain in right hand: Secondary | ICD-10-CM | POA: Insufficient documentation

## 2017-05-23 DIAGNOSIS — Z7982 Long term (current) use of aspirin: Secondary | ICD-10-CM | POA: Insufficient documentation

## 2017-05-23 DIAGNOSIS — Z794 Long term (current) use of insulin: Secondary | ICD-10-CM | POA: Insufficient documentation

## 2017-05-23 DIAGNOSIS — Z79899 Other long term (current) drug therapy: Secondary | ICD-10-CM | POA: Insufficient documentation

## 2017-05-23 DIAGNOSIS — E119 Type 2 diabetes mellitus without complications: Secondary | ICD-10-CM | POA: Insufficient documentation

## 2017-05-23 MED ORDER — KETOROLAC TROMETHAMINE 30 MG/ML IJ SOLN
30.0000 mg | Freq: Once | INTRAMUSCULAR | Status: AC
  Administered 2017-05-23: 30 mg via INTRAMUSCULAR
  Filled 2017-05-23: qty 1

## 2017-05-23 MED ORDER — KETOROLAC TROMETHAMINE 10 MG PO TABS: 10.0000 mg | ORAL_TABLET | Freq: Four times a day (QID) | ORAL | 0 refills | Status: AC | PRN

## 2017-05-23 NOTE — ED Provider Notes (Signed)
[facility] Emergency Department Provider Note   ____________________________________________   I have reviewed the triage vital signs and the nursing notes.   HISTORY  Chief Complaint Hand Pain    HPI Sarah Duffy is a 46 y.o. 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 Patient denies any past history of similar symptoms. She describes pain, swelling, occasional popping when flexing her fingers and redness of the hands and fingers however denies numbness and tingling. Patient denies history of arthritis or any recent trauma to bilateral hands. Patient denies fever, chills, headache, vision changes, chest pain, chest tightness, shortness of breath, abdominal pain, nausea and vomiting.  Past Medical History:  Diagnosis Date  . Diabetes mellitus without complication [facility])     Patient Active Problem List   Diagnosis Date Noted  . 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([place]) 05/22/2017  . Osteomyelitis of foot ([place]) 08/26/2016  . Diabetic retinopathy of left eye, with macular edema, with severe nonproliferative retinopathy, associated with type 2 diabetes mellitus ([place]) 05/21/2016  . Diabetic retinopathy of right eye, without macular edema, with severe nonproliferative retinopathy, associated with type 2 diabetes mellitus ([place]) 05/21/2016  . Hyperlipidemia 03/10/2016  . MRSA infection within last 3 months 02/25/2016  . MI (mitral incompetence) 02/25/2016  . Foot pain 02/25/2016  . Insomnia 02/25/2016  . Uncontrolled type 2 diabetes mellitus with foot ulcer ([place]) 02/03/2016    Past Surgical History:  Procedure Laterality Date  . CHOLECYSTECTOMY  1999  . TOE SURGERY Left 02/07/2016   Pinky Toe    Prior to Admission medications   Medication Sig Start Date End Date Taking? Authorizing Provider  aspirin EC 81 MG tablet Take 1 tablet (81 mg total) by mouth  daily. 03/10/16   Steele Sizer, MD  atorvastatin (LIPITOR) 40 MG tablet Take 1 tablet (40 mg total) by mouth daily. 05/05/17   Steele Sizer, MD  Blood Glucose Monitoring Suppl (ACCU-CHEK AVIVA CONNECT) w/Device KIT 1 kit by Does not apply route daily. 05/07/16   Steele Sizer, MD  diclofenac sodium (VOLTAREN) 1 % GEL Apply 4 g topically 4 (four) times daily. 06/19/16   Steele Sizer, MD  DULoxetine (CYMBALTA) 30 MG capsule Take 1-2 capsules (30-60 mg total) by mouth daily. 05/05/17   Steele Sizer, MD  Empagliflozin-Metformin HCl ER (SYNJARDY XR) 04-999 MG TB24 Take 2 tablets by mouth daily. 05/05/17   Steele Sizer, MD  glucose blood (ACCU-CHEK AVIVA) test strip Use as instructed  - four to five times daily 05/05/17   Steele Sizer, MD  insulin aspart (NOVOLOG FLEXPEN) 100 UNIT/ML FlexPen Inject 5-10 Units into the skin 3 (three) times daily with meals. 05/05/17   Steele Sizer, MD  Insulin Degludec-Liraglutide (XULTOPHY) 100-3.6 UNIT-MG/ML SOPN Inject 50 Units into the skin daily. In place of Lantus and Victoza 05/05/17   Steele Sizer, MD  Insulin Pen Needle (UNIFINE PENTIPS) 31G X 5 MM MISC by Does not apply route. 02/14/16   [provider]  ketorolac (TORADOL) 10 MG tablet Take 1 tablet (10 mg total) by mouth every 6 (six) hours as needed. 05/23/17 05/28/17  Little, Traci M, PA-C  Lancets (ACCU-CHEK SOFT TOUCH) lancets Use as instructed 05/05/17   Steele Sizer, MD  losartan (COZAAR) 50 MG tablet Take 1 tablet (50 mg total) by mouth daily. 05/05/17   Steele Sizer, MD  MedroxyPROGESTERone Acetate 150 MG/ML SUSY Inject 1 mL (150 mg total) into the  muscle once. 05/02/1998 05/02/1998  Steele Sizer, MD  Multiple Vitamin (MULTIVITAMIN) capsule Take by mouth.    [provider]  traZODone (DESYREL) 50 MG tablet Take 1 tablet (50 mg total) by mouth at bedtime. 05/05/17   Steele Sizer, MD    Allergies Patient has no known allergies.  Family History  Problem Relation  Age of Onset  . Diabetes Mother   . Heart disease Father   . AAA (abdominal aortic aneurysm) Father   . Heart attack Sister     Social History Social History  Substance Use Topics  . Smoking status: Never Smoker  . Smokeless tobacco: Never Used  . Alcohol use No    Review of Systems Constitutional: Negative for fever/chills Eyes: No visual changes. Cardiovascular: Denies chest pain. Musculoskeletal: Bilateral hand and finger pain, swelling. Skin: Negative for rash. Neurological: Negative for headaches.  Negative focal weakness or numbness. Negative for loss of consciousness. Able to ambulate. ____________________________________________   PHYSICAL EXAM:  VITAL SIGNS: ED Triage Vitals  Enc Vitals Group     BP 05/23/17 1055 140/76     Pulse Rate 05/23/17 1055 92     Resp 05/23/17 1055 16     Temp 05/23/17 1055 98.6 F (37 C)     Temp Source 05/23/17 1055 Oral     SpO2 05/23/17 1055 99 %     Weight 05/23/17 1055 198 lb (89.8 kg)     Height 05/23/17 1055 5' 8" (1.727 m)     Head Circumference --      Peak Flow --      Pain Score 05/23/17 1058 7     Pain Loc --      Pain Edu? --      Excl. in [place]? --     Constitutional: Alert and oriented. Well appearing and in no acute distress.  Eyes: Conjunctivae are normal. PERRL. . Cardiovascular: Normal rate, regular rhythm. Normal distal pulses. Respiratory: Normal respiratory effort.  Musculoskeletal: Bilateral hand and finger swelling, redness with increased pain when flexing and extending digits. Intact sensation in bilateral hands and fingers. Noted popping and triggering at flexor tendons intermittently and bilateral fingers. Range of motion and strength intact and intrinsic hand and fingers. Neurologic: Normal speech and language.  Skin:  Skin is warm, dry and intact. No rash noted. Psychiatric: Mood and affect are normal.  ____________________________________________   LABS (all labs ordered are listed, but only  abnormal results are displayed)  Labs Reviewed - No data to display ____________________________________________  EKG None ____________________________________________  RADIOLOGY None ____________________________________________   PROCEDURES  Procedure(s) performed: No    Critical Care performed: no ____________________________________________   INITIAL IMPRESSION / ASSESSMENT AND PLAN / ED COURSE  Pertinent labs & imaging results that were available during my care of the patient were reviewed by me and considered in my medical decision making (see chart for details).  Patient presents with bilateral hand and finger pain, swelling and redness approximately 3 weeks likely related to overuse injury. Physical exam findings and patient history of events are reassuring. Patient noted decrease symptoms with Toradol during care provided the emergency department today. Patient will be given a prescription for a five-day course of Toradol and recommend she follow-up with an orthopedic hand specialist. Patient Caregiver informed of clinical course, understand medical decision-making process, and agree with plan.  Patient was advised to follow up with orthopedic hand specialist and was also advised to return to the emergency department for symptoms that change or worsen.  ____________________________________________   FINAL CLINICAL IMPRESSION(S) / ED DIAGNOSES  Final diagnoses:  Swelling of both hands  Bilateral hand pain       NEW MEDICATIONS STARTED DURING THIS VISIT:  Discharge Medication List as of 05/23/2017 12:04 PM    START taking these medications   Details  ketorolac (TORADOL) 10 MG tablet Take 1 tablet (10 mg total) by mouth every 6 (six) hours as needed., Starting Sun 05/23/2017, Until Fri 05/28/2017, Print         Note:  This document was prepared using Dragon voice recognition software and may include unintentional dictation errors.    Jerolyn Shin, PA-C 05/23/17 1455    Darel Hong, MD 05/23/17 1614

## 2017-05-23 NOTE — ED Notes (Signed)

## 2017-05-23 NOTE — ED Triage Notes (Signed)
Patient presents to the ED with bilateral hand pain.  Patient has difficulty balling her hands into fists.  Patient states she has been having this difficulty since Mother's Day.  Patient denies known injury to hands.  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  Patient is in no obvious distress at this time.

## 2017-05-24 ENCOUNTER — Encounter: Payer: Self-pay | Admitting: Family Medicine

## 2017-05-24 ENCOUNTER — Ambulatory Visit (INDEPENDENT_AMBULATORY_CARE_PROVIDER_SITE_OTHER): Payer: Self-pay | Admitting: Family Medicine

## 2017-05-24 VITALS — BP 120/80 | HR 100 | Temp 98.8°F | Resp 16 | Ht 68.0 in | Wt 199.1 lb

## 2017-05-24 DIAGNOSIS — M79641 Pain in right hand: Secondary | ICD-10-CM

## 2017-05-24 DIAGNOSIS — M79642 Pain in left hand: Secondary | ICD-10-CM

## 2017-05-24 NOTE — Progress Notes (Addendum)
Name: Sarah Duffy   MRN: [id number]    DOB: 01/09/1971   Date:05/24/2017       Progress Note  Subjective  Chief Complaint  Chief Complaint  Patient presents with  . Hand Pain    seen in ER, hands swollen, pain running down in hands    HPI  Pt presents for ER follow up regarding bilateral hand pain ongoing since May 15th. She notes she rolled a lot of silverware that day for Mother's Day, and her hands have had some swelling and significant pain since then at the bilateral MIP joints of the index fingers. No fevers/chills, numbness/tingling. Endorses some "sticking" when she tried to bend the RIGHT index finger. Massage & continual movement also makes the pain better. Notes stiffness late at night/early morning, gets better throughout the day with use. PT was told to call [facility] to set up an appointment when at the ER.  Patient Active Problem List   Diagnosis Date Noted  . 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 Osteomyelitis of foot ([place]) 08/26/2016  . Diabetic retinopathy of left eye, with macular edema, with severe nonproliferative retinopathy, associated with type 2 diabetes mellitus ([place]) 05/21/2016  . Diabetic retinopathy of right eye, without macular edema, with severe nonproliferative retinopathy, associated with type 2 diabetes mellitus ([place]) 05/21/2016  . Hyperlipidemia 03/10/2016  . MRSA infection within last 3 months 02/25/2016  . MI (mitral incompetence) 02/25/2016  . Foot pain 02/25/2016  . Insomnia 02/25/2016  . Uncontrolled type 2 diabetes mellitus with foot ulcer ([place]) 02/03/2016    Social History  Substance Use Topics  . Smoking status: Never Smoker  . Smokeless tobacco: Never Used  . Alcohol use No    Current Outpatient Prescriptions:  .  aspirin EC 81 MG tablet, Take 1 tablet (81 mg total) by mouth daily., Disp: 30 tablet, Rfl: 0 .  atorvastatin (LIPITOR) 40 MG tablet, Take 1 tablet (40 mg total) by mouth daily., Disp: 90 tablet,  Rfl: 1 .  Blood Glucose Monitoring Suppl (ACCU-CHEK AVIVA CONNECT) w/Device KIT, 1 kit by Does not apply route daily., Disp: 1 kit, Rfl: 0 .  diclofenac sodium (VOLTAREN) 1 % GEL, Apply 4 g topically 4 (four) times daily., Disp: 100 g, Rfl: 2 .  DULoxetine (CYMBALTA) 30 MG capsule, Take 1-2 capsules (30-60 mg total) by mouth daily., Disp: 60 capsule, Rfl: 0 .  Empagliflozin-Metformin HCl ER (SYNJARDY XR) 04-999 MG TB24, Take 2 tablets by mouth daily., Disp: 60 tablet, Rfl: 2 .  glucose blood (ACCU-CHEK AVIVA) test strip, Use as instructed  - four to five times daily, Disp: 100 each, Rfl: 5 .  insulin aspart (NOVOLOG FLEXPEN) 100 UNIT/ML FlexPen, Inject 5-10 Units into the skin 3 (three) times daily with meals., Disp: 15 mL, Rfl: 2 .  Insulin Degludec-Liraglutide (XULTOPHY) 100-3.6 UNIT-MG/ML SOPN, Inject 50 Units into the skin daily. In place of Lantus and Victoza, Disp: 9 mL, Rfl: 2 .  Insulin Pen Needle (UNIFINE PENTIPS) 31G X 5 MM MISC, by Does not apply route., Disp: , Rfl:  .  ketorolac (TORADOL) 10 MG tablet, Take 1 tablet (10 mg total) by mouth every 6 (six) hours as needed., Disp: 20 tablet, Rfl: 0 .  Lancets (ACCU-CHEK SOFT TOUCH) lancets, Use as instructed, Disp: 100 each, Rfl: 12 .  losartan (COZAAR) 50 MG tablet, Take 1 tablet (50 mg total) by mouth daily., Disp: 90 tablet, Rfl: 1 .  Multiple Vitamin (MULTIVITAMIN) capsule, Take by mouth., Disp: , Rfl:  .  traZODone (DESYREL) 50 MG tablet, Take 1 tablet (50 mg total) by mouth at bedtime., Disp: 90 tablet, Rfl: 1 .  MedroxyPROGESTERone Acetate 150 MG/ML SUSY, Inject 1 mL (150 mg total) into the muscle once., Disp: 0.9 mL, Rfl: 0  No Known Allergies  ROS  Constitutional: Negative for fever or weight change.  Respiratory: Negative for cough and shortness of breath.   Cardiovascular: Negative for chest pain or palpitations.  Gastrointestinal: Negative for abdominal pain, no bowel changes.  Musculoskeletal: Negative for gait problem;  positive for joint swelling.  Skin: Negative for rash.  Neurological: Negative for dizziness or headache.  No other specific complaints in a complete review of systems (except as listed in HPI above).  Objective  Vitals:   05/24/17 0813  BP: 120/80  Pulse: 100  Resp: 16  Temp: 98.8 F (37.1 C)  TempSrc: Oral  SpO2: 98%  Weight: 199 lb 1.6 oz (90.3 kg)  Height: 5' 8"  (1.727 m)    Body mass index is 30.27 kg/m.  Nursing Note and Vital Signs reviewed.  Physical Exam  Constitutional: Patient appears well-developed and well-nourished. Obese No distress.  HEENT: head atraumatic, normocephalic Cardiovascular: Normal rate, regular rhythm, S1/S2 present.  No murmur or rub heard. No BLE edema. Pulmonary/Chest: Effort normal and breath sounds clear. No respiratory distress or retractions. MSK: tenderness to bilateral MIP joints of the index finger, worse on the right. Mild swelling and mild erythema on right index finger only. Pt exhibits full AROM bilaterally, grip strength equal, no crepitus or delayed release of extension/flexion. Radial pulses strong, cap refill <3 seconds, sensation intact bilaterally. Psychiatric: Patient has a normal mood and affect. behavior is normal. Judgment and thought content normal.  Recent Results (from the past 2160 hour(s))  POCT glycosylated hemoglobin (Hb A1C)     Status: Abnormal   Collection Time: 05/05/17  8:27 AM  Result Value Ref Range   Hemoglobin A1C 8.2   POCT UA - Microalbumin     Status: Abnormal   Collection Time: 05/05/17  1:04 PM  Result Value Ref Range   Microalbumin Ur, POC 50 mg/L   Creatinine, POC  mg/dL   Albumin/Creatinine Ratio, Urine, POC       Assessment & Plan  1. Bilateral hand pain - Ambulatory referral to Orthopedic Surgery - Discussed conservative measures per AVS, but due to recent ER visit and ongoing pain, Ortho referral is made.  - Pt has T2DM and has not been able to have labs drawn in over a year. Advised  to use Tylenol PRN until she is able to have labs drawn to ensure Kidney function is suitable for NSAID use.  -Red flags and when to present for emergency care or RTC including fever >101.62F, chest pain, shortness of breath, new/worsening/un-resolving symptoms, decreased sensation, and/or increased swelling or pain reviewed with patient at time of visit. Follow up and care instructions discussed and provided in AVS.  I have reviewed this encounter including the documentation in this note and/or discussed this patient with the Johney Maine, FNP, NP-C. I am certifying that I agree with the content of this note as supervising physician.  Steele Sizer, MD [facility] 05/24/2017, 1:06 PM

## 2017-05-24 NOTE — Patient Instructions (Addendum)
Biofreeze or Bengay topical Take Tylenol as needed for your pain Ice hands daily, more often if needed Only do hand exercises as tolerated if your pain improves.  Hand Pain Many things can cause hand pain. Some common causes are:  An injury.  Repeating the same movement with your hand over and over (overuse).  Osteoporosis.  Arthritis.  Lumps in the tendons or joints of the hand and wrist (ganglion cysts).  Infection.  Follow these instructions at home: Pay attention to any changes in your symptoms. Take these actions to help with your discomfort:  If directed, put ice on the affected area: ? Put ice in a plastic bag. ? Place a towel between your skin and the bag. ? Leave the ice on for 15-20 minutes, 3?4 times a day for 2 days.  Take over-the-counter and prescription medicines only as told by your health care provider.  Minimize stress on your hands and wrists as much as possible.  Take breaks from repetitive activity often.  Do stretches as told by your health care provider.  Do not do activities that make your pain worse.  Contact a health care provider if:  Your pain does not get better after a few days of self-care.  Your pain gets worse.  Your pain affects your ability to do your daily activities. Get help right away if:  Your hand becomes warm, red, or swollen.  Your hand is numb or tingling.  Your hand is extremely swollen or deformed.  Your hand or fingers turn white or blue.  You cannot move your hand, wrist, or fingers. This information is not intended to replace advice given to you by your health care provider. Make sure you discuss any questions you have with your health care provider. Document Released: 12/27/2015 Document Revised: 05/07/2016 Document Reviewed: 12/26/2014 Elsevier Interactive Patient Education  2018 Elsevier Inc.  Hand Exercises Hand exercises can be helpful to almost anyone. These exercises can strengthen the hands, improve  flexibility and movement, and increase blood flow to the hands. These results can make work and daily tasks easier. Hand exercises can be especially helpful for people who have joint pain from arthritis or have nerve damage from overuse (carpal tunnel syndrome). These exercises can also help people who have injured a hand. Most of these hand exercises are fairly gentle stretching routines. You can do them often throughout the day. Still, it is a good idea to ask your health care provider which exercises would be best for you. Warming your hands before exercise may help to reduce stiffness. You can do this with gentle massage or by placing your hands in warm water for 15 minutes. Also, make sure you pay attention to your level of hand pain as you begin an exercise routine. Exercises Knuckle Bend Repeat this exercise 5-10 times with each hand. 1. Stand or sit with your arm, hand, and all five fingers pointed straight up. Make sure your wrist is straight. 2. Gently and slowly bend your fingers down and inward until the tips of your fingers are touching the tops of your palm. 3. Hold this position for a few seconds. 4. Extend your fingers out to their original position, all pointing straight up again.  Finger Fan Repeat this exercise 5-10 times with each hand. 1. Hold your arm and hand out in front of you. Keep your wrist straight. 2. Squeeze your hand into a fist. 3. Hold this position for a few seconds. 4. Loel Dubonnet out, or spread apart, your  hand and fingers as much as possible, stretching every joint fully.  Tabletop Repeat this exercise 5-10 times with each hand. 1. Stand or sit with your arm, hand, and all five fingers pointed straight up. Make sure your wrist is straight. 2. Gently and slowly bend your fingers at the knuckles where they meet the hand until your hand is making an upside-down L shape. Your fingers should form a tabletop. 3. Hold this position for a few seconds. 4. Extend your  fingers out to their original position, all pointing straight up again.  Making Os Repeat this exercise 5-10 times with each hand. 1. Stand or sit with your arm, hand, and all five fingers pointed straight up. Make sure your wrist is straight. 2. Make an O shape by touching your pointer finger to your thumb. Hold for a few seconds. Then open your hand wide. 3. Repeat this motion with each finger on your hand.  Table Spread Repeat this exercise 5-10 times with each hand. 1. Place your hand on a table with your palm facing down. Make sure your wrist is straight. 2. Spread your fingers out as much as possible. Hold this position for a few seconds. 3. Slide your fingers back together again. Hold for a few seconds.  Ball Grip  Repeat this exercise 10-15 times with each hand. 1. Hold a tennis ball or another soft ball in your hand. 2. While slowly increasing pressure, squeeze the ball as hard as possible. 3. Squeeze as hard as you can for 3-5 seconds. 4. Relax and repeat.  Wrist Curls Repeat this exercise 10-15 times with each hand. 1. Sit in a chair that has armrests. 2. Hold a light weight in your hand, such as a dumbbell that weighs 1-3 pounds (0.5-1.4 kg). Ask your health care provider what weight would be best for you. 3. Rest your hand just over the end of the chair arm with your palm facing up. 4. Gently pivot your wrist up and down while holding the weight. Do not twist your wrist from side to side.  Contact a health care provider if:  Your hand pain or discomfort gets much worse when you do an exercise.  Your hand pain or discomfort does not improve within 2 hours after you exercise. If you have any of these problems, stop doing these exercises right away. Do not do them again unless your health care provider says that you can. Get help right away if:  You develop sudden, severe hand pain. If this happens, stop doing these exercises right away. Do not do them again unless your  health care provider says that you can. This information is not intended to replace advice given to you by your health care provider. Make sure you discuss any questions you have with your health care provider. Document Released: 11/11/2015 Document Revised: 05/07/2016 Document Reviewed: 06/10/2015 Elsevier Interactive Patient Education  [employer]2018 Elsevier Inc.

## 2017-08-13 ENCOUNTER — Ambulatory Visit: Payer: Self-pay | Admitting: Family Medicine

## 2017-11-06 ENCOUNTER — Other Ambulatory Visit: Payer: Self-pay | Admitting: Family Medicine

## 2018-01-05 ENCOUNTER — Encounter: Payer: Self-pay | Admitting: Family Medicine

## 2018-01-05 ENCOUNTER — Ambulatory Visit: Payer: BLUE CROSS/BLUE SHIELD | Admitting: Family Medicine

## 2018-01-05 VITALS — BP 156/84 | HR 99 | Temp 98.1°F | Resp 18 | Ht 68.0 in | Wt 195.1 lb

## 2018-01-05 DIAGNOSIS — E1165 Type 2 diabetes mellitus with hyperglycemia: Secondary | ICD-10-CM | POA: Diagnosis not present

## 2018-01-05 DIAGNOSIS — G4700 Insomnia, unspecified: Secondary | ICD-10-CM

## 2018-01-05 DIAGNOSIS — E113491 Type 2 diabetes mellitus with severe nonproliferative diabetic retinopathy without macular edema, right eye: Secondary | ICD-10-CM | POA: Diagnosis not present

## 2018-01-05 DIAGNOSIS — I10 Essential (primary) hypertension: Secondary | ICD-10-CM

## 2018-01-05 DIAGNOSIS — E1129 Type 2 diabetes mellitus with other diabetic kidney complication: Secondary | ICD-10-CM | POA: Diagnosis not present

## 2018-01-05 DIAGNOSIS — R809 Proteinuria, unspecified: Secondary | ICD-10-CM | POA: Diagnosis not present

## 2018-01-05 DIAGNOSIS — E782 Mixed hyperlipidemia: Secondary | ICD-10-CM

## 2018-01-05 DIAGNOSIS — IMO0002 Reserved for concepts with insufficient information to code with codable children: Secondary | ICD-10-CM

## 2018-01-05 DIAGNOSIS — E1140 Type 2 diabetes mellitus with diabetic neuropathy, unspecified: Secondary | ICD-10-CM | POA: Diagnosis not present

## 2018-01-05 LAB — POCT GLYCOSYLATED HEMOGLOBIN (HGB A1C): HEMOGLOBIN A1C: 12.9

## 2018-01-05 MED ORDER — FREESTYLE LIBRE 14 DAY SENSOR MISC: 1.0000 | Freq: Four times a day (QID) | 2 refills | Status: DC

## 2018-01-05 MED ORDER — OLMESARTAN MEDOXOMIL-HCTZ 20-12.5 MG PO TABS: 1.0000 | ORAL_TABLET | Freq: Every day | ORAL | 0 refills | Status: DC

## 2018-01-05 MED ORDER — ATORVASTATIN CALCIUM 40 MG PO TABS: 40.0000 mg | ORAL_TABLET | Freq: Every day | ORAL | 1 refills | Status: DC

## 2018-01-05 MED ORDER — INSULIN PEN NEEDLE 31G X 5 MM MISC: 1.0000 | Freq: Four times a day (QID) | 1 refills | Status: DC

## 2018-01-05 MED ORDER — EMPAGLIFLOZIN-METFORMIN HCL ER 5-1000 MG PO TB24: 2.0000 | ORAL_TABLET | Freq: Every day | ORAL | 1 refills | Status: DC

## 2018-01-05 MED ORDER — INSULIN DEGLUDEC-LIRAGLUTIDE 100-3.6 UNIT-MG/ML ~~LOC~~ SOPN: 50.0000 [IU] | PEN_INJECTOR | Freq: Every day | SUBCUTANEOUS | 1 refills | Status: DC

## 2018-01-05 MED ORDER — FREESTYLE LIBRE 14 DAY READER DEVI: 1.0000 | Freq: Four times a day (QID) | 2 refills | Status: DC

## 2018-01-05 MED ORDER — QUETIAPINE FUMARATE 25 MG PO TABS: 25.0000 mg | ORAL_TABLET | Freq: Every day | ORAL | 0 refills | Status: DC

## 2018-01-05 NOTE — Progress Notes (Signed)
Name: Sarah Duffy   MRN: [id number]    DOB: [birth year]   Date:01/05/2018       Progress Note  Subjective  Chief Complaint  Chief Complaint  Patient presents with  . Medication Refill  . Diabetes    Out of strips needs a refill-checks every other day-Highest-240 Average-140 Lowest-107  . Hyperlipidemia  . Depression    Has been find  . Insomnia    Still haven't been able to sleep-medication is not helping (Trazodone)    HPI  DM with history ofleft foot ulcer, microalbuminuria, retinopathy: hospitalized at [facility] 02/03/2016 for evaluation of a foot ulcer ( started after she fell and had a blister on left foot ), her hgbA1C was very high during admission 13.9%, had debridement of lesion of left foot it was positive for MRSA, she is off wound vac but is back on triple antibiotic since 06/23 per ID - Dr. Rosario Adie and later on diagnosed with osteomyelitis of left foot. She was out of work for over one year, back since 2017-02-21, and for a period of time had insurance and with medication her hgbA1C went down to 8.2%, however has been out of medication because and lost to follow up since May 2018 because of insurance problems. She is back today and wants to resume her medications. . She denies polyphagia, polydipsia or polyuria, but has been craving bread again.   Insomnia:she states Trazodone does not work very well, but also states mind wanders at night, we will try Seroquel  Depression: There is a family history of suicide ( brother ), she struggled after her brother died in 02/21/11. He was 31 yo. She was given Duloxetine back in May but not taking it, she is on her own again, back to work and feeling well, not interested in resuming medication at this time  Hyperlipidemia: she has been off medication, we will resume medication  HTN: uncontrolled, off Losartan, we will try benicar hctz, no chest pain or palpitation  Patient Active Problem List   Diagnosis Date Noted  . Diabetes mellitus with  microalbuminuria ([place]) 05/22/2017  . Osteomyelitis of foot ([place]) 08/26/2016  . Diabetic retinopathy of left eye, with macular edema, with severe nonproliferative retinopathy, associated with type 2 diabetes mellitus 05/21/2016  . Severe nonproliferative diabetic retinopathy of right eye without macular edema associated with type 2 diabetes mellitus ([place]) 05/21/2016  . Hyperlipidemia 03/10/2016  . MRSA infection within last 3 months 02/25/2016  . MI (mitral incompetence) 02/25/2016  . Foot pain 02/25/2016  . Insomnia 02/25/2016  . Uncontrolled type 2 diabetes mellitus with foot ulcer ([place]) 02/03/2016    Past Surgical History:  Procedure Laterality Date  . CHOLECYSTECTOMY  1999  . TOE SURGERY Left 02/07/2016   Pinky Toe    Family History  Problem Relation Age of Onset  . Diabetes Mother   . Heart disease Father   . AAA (abdominal aortic aneurysm) Father   . Heart attack Sister     Social History   Socioeconomic History  . Marital status: Single    Spouse name: Not on file  . Number of children: 1  . Years of education: Not on file  . Highest education level: Some college, no degree  Social Needs  . Financial resource strain: Not on file  . Food insecurity - worry: Not on file  . Food insecurity - inability: Not on file  . Transportation needs - medical: Not on file  . Transportation needs - non-medical: Not on  file  Occupational History  . Occupation: Public affairs consultant   Tobacco Use  . Smoking status: Never Smoker  . Smokeless tobacco: Never Used  Substance and Sexual Activity  . Alcohol use: No    Alcohol/week: 0.0 oz  . Drug use: No  . Sexual activity: Yes    Partners: Male    Birth control/protection: Injection  Other Topics Concern  . Not on file  Social History Narrative   She used to work at [employer], but  Feb 2017 she left work because of  MRSA infection.osteomyelitis and uncontrolled DM. She has been back to work since Feb 2018   Lives alone      Current Outpatient Medications:  .  amoxicillin (AMOXIL) 500 MG capsule, TK ONE C PO  QID TAT, Disp: , Rfl: 0 .  aspirin EC 81 MG tablet, Take 1 tablet (81 mg total) by mouth daily., Disp: 30 tablet, Rfl: 0 .  atorvastatin (LIPITOR) 40 MG tablet, Take 1 tablet (40 mg total) by mouth daily., Disp: 90 tablet, Rfl: 1 .  Blood Glucose Monitoring Suppl (ACCU-CHEK AVIVA CONNECT) w/Device KIT, 1 kit by Does not apply route daily., Disp: 1 kit, Rfl: 0 .  diclofenac sodium (VOLTAREN) 1 % GEL, Apply 4 g topically 4 (four) times daily., Disp: 100 g, Rfl: 2 .  Empagliflozin-Metformin HCl ER (SYNJARDY XR) 04-999 MG TB24, Take 2 tablets by mouth daily., Disp: 180 tablet, Rfl: 1 .  glucose blood (ACCU-CHEK AVIVA) test strip, Use as instructed  - four to five times daily, Disp: 100 each, Rfl: 5 .  insulin aspart (NOVOLOG FLEXPEN) 100 UNIT/ML FlexPen, Inject 5-10 Units into the skin 3 (three) times daily with meals., Disp: 15 mL, Rfl: 2 .  Insulin Degludec-Liraglutide (XULTOPHY) 100-3.6 UNIT-MG/ML SOPN, Inject 50 Units into the skin daily., Disp: 27 mL, Rfl: 1 .  Insulin Pen Needle (UNIFINE PENTIPS) 31G X 5 MM MISC, 1 each by Does not apply route 4 (four) times daily., Disp: 500 each, Rfl: 1 .  Lancets (ACCU-CHEK SOFT TOUCH) lancets, Use as instructed, Disp: 100 each, Rfl: 12 .  Multiple Vitamin (MULTIVITAMIN) capsule, Take by mouth., Disp: , Rfl:  .  MedroxyPROGESTERone Acetate 150 MG/ML SUSY, Inject 1 mL (150 mg total) into the muscle once., Disp: 0.9 mL, Rfl: 0 .  olmesartan-hydrochlorothiazide (BENICAR HCT) 20-12.5 MG tablet, Take 1 tablet by mouth daily., Disp: 30 tablet, Rfl: 0 .  QUEtiapine (SEROQUEL) 25 MG tablet, Take 1 tablet (25 mg total) by mouth at bedtime., Disp: 30 tablet, Rfl: 0  No Known Allergies   ROS  Constitutional: Negative for fever or weight change.  Respiratory: Negative for cough and shortness of breath.   Cardiovascular: Negative for chest pain or palpitations.   Gastrointestinal: Negative for abdominal pain, no bowel changes.  Musculoskeletal: positive  for gait problem , still has pain and weakness on lateral column of left foot.   No joint swelling.  Skin: Negative for rash.  Neurological: Negative for dizziness or headache.  No other specific complaints in a complete review of systems (except as listed in HPI above).  Objective  Vitals:   01/05/18 0944  BP: (!) 156/84  Pulse: 99  Resp: 18  Temp: 98.1 F (36.7 C)  TempSrc: Oral  SpO2: 98%  Weight: 195 lb 1.6 oz (88.5 kg)  Height: _0  (1.727 m)    Body mass index is 29.66 kg/m.  Physical Exam  Constitutional: Patient appears well-developed and well-nourished. Obese No distress.  HEENT:  head atraumatic, normocephalic, pupils equal and reactive to light,  neck supple, throat within normal limits Cardiovascular: Normal rate, regular rhythm and normal heart sounds.  1 plus systolic ejection  murmur heard. Trace  BLE edema. Pulmonary/Chest: Effort normal and breath sounds normal. No respiratory distress. Abdominal: Soft.  There is no tenderness. Psychiatric: Patient has a normal mood and affect. behavior is normal. Judgment and thought content normal.  Recent Results (from the past 2160 hour(s))  POCT HgB A1C     Status: Abnormal   Collection Time: 01/05/18  9:50 AM  Result Value Ref Range   Hemoglobin A1C 12.9      PHQ2/9: Depression screen [facility] 2/9 01/05/2018 06/19/2016 03/10/2016 02/25/2016  Decreased Interest 0 0 0 0  Down, Depressed, Hopeless 0 0 0 0  PHQ - 2 Score 0 0 0 0     Fall Risk: Fall Risk  01/05/2018 06/19/2016 03/10/2016 02/25/2016  Falls in the past year? No No No No     Functional Status Survey: Is the patient deaf or have difficulty hearing?: No Does the patient have difficulty seeing, even when wearing glasses/contacts?: No Does the patient have difficulty concentrating, remembering, or making decisions?: No Does the patient have difficulty walking or  climbing stairs?: No Does the patient have difficulty dressing or bathing?: No Does the patient have difficulty doing errands alone such as visiting a doctor's office or shopping?: No    Assessment & Plan  1. 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([facility])  - POCT HgB A1C - Insulin Pen Needle (UNIFINE PENTIPS) 31G X 5 MM MISC; 1 each by Does not apply route 4 (four) times daily.  Dispense: 500 each; Refill: 1  2. Severe nonproliferative diabetic retinopathy of right eye without macular edema associated with type 2 diabetes mellitus ([facility])  - POCT HgB A1C  3. Mixed hyperlipidemia  - atorvastatin (LIPITOR) 40 MG tablet; Take 1 tablet (40 mg total) by mouth daily.  Dispense: 90 tablet; Refill: 1 - Lipid panel  4. Uncontrolled type 2 diabetes with neuropathy ([facility])  - Empagliflozin-Metformin HCl ER (SYNJARDY XR) 04-999 MG TB24; Take 2 tablets by mouth daily.  Dispense: 180 tablet; Refill: 1 - Insulin Degludec-Liraglutide (XULTOPHY) 100-3.6 UNIT-MG/ML SOPN; Inject 50 Units into the skin daily.  Dispense: 27 mL; Refill: 1 - Insulin Pen Needle (UNIFINE PENTIPS) 31G X 5 MM MISC; 1 each by Does not apply route 4 (four) times daily.  Dispense: 500 each; Refill: 1 - CBC with Differential/Platelet - COMPLETE METABOLIC PANEL WITH GFR  5. Persistent insomnia  - QUEtiapine (SEROQUEL) 25 MG tablet; Take 1 tablet (25 mg total) by mouth at bedtime.  Dispense: 30 tablet; Refill: 0  6. Hypertension, uncontrolled  - olmesartan-hydrochlorothiazide (BENICAR HCT) 20-12.5 MG tablet; Take 1 tablet by mouth daily.  Dispense: 30 tablet; Refill: 0 - TSH

## 2018-01-07 ENCOUNTER — Encounter: Payer: Self-pay | Admitting: Family Medicine

## 2018-01-07 LAB — HM DIABETES EYE EXAM

## 2018-01-14 ENCOUNTER — Encounter: Payer: BLUE CROSS/BLUE SHIELD | Admitting: Family Medicine

## 2018-01-14 ENCOUNTER — Encounter: Payer: Self-pay | Admitting: Family Medicine

## 2018-01-14 ENCOUNTER — Ambulatory Visit (INDEPENDENT_AMBULATORY_CARE_PROVIDER_SITE_OTHER): Payer: BLUE CROSS/BLUE SHIELD | Admitting: Family Medicine

## 2018-01-14 VITALS — BP 152/80 | HR 100 | Temp 98.0°F | Resp 16 | Ht 68.5 in | Wt 198.0 lb

## 2018-01-14 DIAGNOSIS — Z793 Long term (current) use of hormonal contraceptives: Secondary | ICD-10-CM

## 2018-01-14 DIAGNOSIS — Z113 Encounter for screening for infections with a predominantly sexual mode of transmission: Secondary | ICD-10-CM

## 2018-01-14 DIAGNOSIS — G43009 Migraine without aura, not intractable, without status migrainosus: Secondary | ICD-10-CM

## 2018-01-14 DIAGNOSIS — Z124 Encounter for screening for malignant neoplasm of cervix: Secondary | ICD-10-CM | POA: Diagnosis not present

## 2018-01-14 DIAGNOSIS — Z3040 Encounter for surveillance of contraceptives, unspecified: Secondary | ICD-10-CM

## 2018-01-14 DIAGNOSIS — Z23 Encounter for immunization: Secondary | ICD-10-CM

## 2018-01-14 DIAGNOSIS — Z01411 Encounter for gynecological examination (general) (routine) with abnormal findings: Secondary | ICD-10-CM | POA: Diagnosis not present

## 2018-01-14 DIAGNOSIS — Z1211 Encounter for screening for malignant neoplasm of colon: Secondary | ICD-10-CM | POA: Diagnosis not present

## 2018-01-14 DIAGNOSIS — Z1231 Encounter for screening mammogram for malignant neoplasm of breast: Secondary | ICD-10-CM

## 2018-01-14 DIAGNOSIS — I10 Essential (primary) hypertension: Secondary | ICD-10-CM | POA: Diagnosis not present

## 2018-01-14 DIAGNOSIS — Z79899 Other long term (current) drug therapy: Secondary | ICD-10-CM

## 2018-01-14 DIAGNOSIS — Z01419 Encounter for gynecological examination (general) (routine) without abnormal findings: Secondary | ICD-10-CM

## 2018-01-14 DIAGNOSIS — Z1239 Encounter for other screening for malignant neoplasm of breast: Secondary | ICD-10-CM

## 2018-01-14 DIAGNOSIS — G43709 Chronic migraine without aura, not intractable, without status migrainosus: Secondary | ICD-10-CM | POA: Insufficient documentation

## 2018-01-14 MED ORDER — TOPIRAMATE 50 MG PO TABS: 50.0000 mg | ORAL_TABLET | Freq: Every day | ORAL | 0 refills | Status: DC

## 2018-01-14 MED ORDER — SUMATRIPTAN SUCCINATE 100 MG PO TABS: 100.0000 mg | ORAL_TABLET | ORAL | 0 refills | Status: DC | PRN

## 2018-01-14 NOTE — Progress Notes (Signed)
Name: Sarah Duffy   MRN: [id number]    DOB: 06/29/1971   Date:01/14/2018       Progress Note  Subjective  Chief Complaint  Chief Complaint  Patient presents with  . Annual Exam    HPI   Patient presents for annual CPE   Hypertension: she did not fill Benicar HCTZ yet, bp is still elevated, denies chest pain or palpitation  Migraine headaches: she states she was diagnosed many years ago, but was doing well, until last year when she tried Norplent, it was removed but she continues to have episodes. She states headache starts on nuchal area and radiates to left frontal area and behind left eye. Described throbbing and associated with nausea and vomiting. Also has photophobia and phonophobia, she has missed to days at work because of it. She states currently she has been taking tylenol prn. She states episodes are happening a few times a month, she also has some dull headaches not as intense a few times a week. She has never taken triptans or topamax.   Diet: trying to get better, still not following a diabetic diet Exercise: not recently   USPSTF grade A and B recommendations  Depression:  Depression screen [facility] 2/9 01/05/2018 06/19/2016 03/10/2016 02/25/2016  Decreased Interest 0 0 0 0  Down, Depressed, Hopeless 0 0 0 0  PHQ - 2 Score 0 0 0 0   Hypertension: BP Readings from Last 3 Encounters:  01/14/18 (!) 152/80  01/05/18 (!) 156/84  05/24/17 120/80   Obesity: Wt Readings from Last 3 Encounters:  01/14/18 198 lb (89.8 kg)  01/05/18 195 lb 1.6 oz (88.5 kg)  05/24/17 199 lb 1.6 oz (90.3 kg)   BMI Readings from Last 3 Encounters:  01/14/18 29.67 kg/m  01/05/18 29.66 kg/m  05/24/17 30.27 kg/m     HIV, hep B, hep C: we will order STD testing and prevention (chl/gon/syphilis): we will check it  Intimate partner violence: negative screen  Sexual History/Pain during Intercourse: no Menstrual History/LMP/Abnormal Bleeding: no cycles on Depo for 20 years Incontinence  Symptoms: no  Advanced Care Planning: A voluntary discussion about advance care planning including the explanation and discussion of advance directives.  Discussed health care proxy and Living will, and the patient was able to identify a health care proxy as son Delories Heinz) .  Patient does not have a living will at present time. If patient does have living will, I have requested they bring this to the clinic to be scanned in to their chart.  Breast cancer:  HM Mammogram  Date Value Ref Range Status  03/15/2013 Normal  Final    BRCA gene screening: maternal aunt and grandmother had breast cancer, also one maternal aunt with ovarian cancer - she will think about it before she gets tested for it.  Cervical cancer screening: last one was normal at the health department in 2016 , but had abnormal pap back in 2014, colposcopy was done Osteoporosis screen: on Depo for over 20 years, no menses for a long time, we will order bone density   Lipids:  Lab Results  Component Value Date   CHOL 208 (H) 02/28/2016   Lab Results  Component Value Date   HDL 42 02/28/2016   Lab Results  Component Value Date   LDLCALC 152 (H) 02/28/2016   Lab Results  Component Value Date   TRIG 69 02/28/2016   Lab Results  Component Value Date   CHOLHDL 5.0 (H) 02/28/2016   No results  found for: LDLDIRECT  Glucose:  Glucose  Date Value Ref Range Status  02/28/2016 276 (H) 65 - 99 mg/dL Final    Comment:    Specimen received in contact with cells. Hemolysis present. GLUC may be decreased and K increased. Clinical correlation indicated.    Glucose, Bld  Date Value Ref Range Status  01/31/2016 362 (H) 65 - 99 mg/dL Final     Colorectal cancer:  We will refer her Lung cancer:  Low Dose CT Chest recommended if Age 33-80 years, 30 pack-year currently smoking OR have quit w/in 15years. Patient does not qualify.   Aspirin: yes  [id number]  Patient Active Problem List   Diagnosis Date Noted  . Migraine  headache without aura 01/14/2018  . 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 Osteomyelitis of foot ([place]) 08/26/2016  . Diabetic retinopathy of left eye, with macular edema, with severe nonproliferative retinopathy, associated with type 2 diabetes mellitus 05/21/2016  . 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 Hyperlipidemia 03/10/2016  . MRSA infection within last 3 months 02/25/2016  . MI (mitral incompetence) 02/25/2016  . Foot pain 02/25/2016  . Insomnia 02/25/2016  . Uncontrolled type 2 diabetes mellitus with foot ulcer ([place]) 02/03/2016    Past Surgical History:  Procedure Laterality Date  . CHOLECYSTECTOMY  1999  . TOE SURGERY Left 02/07/2016   Pinky Toe    Family History  Problem Relation Age of Onset  . Diabetes Mother   . Heart disease Father   . AAA (abdominal aortic aneurysm) Father   . Heart attack Sister     Social History   Socioeconomic History  . Marital status: Single    Spouse name: Not on file  . Number of children: 1  . Years of education: Not on file  . Highest education level: Some college, no degree  Social Needs  . Financial resource strain: Not on file  . Food insecurity - worry: Not on file  . Food insecurity - inability: Not on file  . Transportation needs - medical: Not on file  . Transportation needs - non-medical: Not on file  Occupational History  . Occupation: Public affairs consultant   Tobacco Use  . Smoking status: Never Smoker  . Smokeless tobacco: Never Used  Substance and Sexual Activity  . Alcohol use: No    Alcohol/week: 0.0 oz  . Drug use: No  . Sexual activity: Yes    Partners: Male    Birth control/protection: Injection  Other Topics Concern  . Not on file  Social History Narrative   She used to work at [employer], but  Feb 2017 she left work because of  MRSA infection.osteomyelitis and uncontrolled DM. She  has been back to work since Feb 2018   Lives alone     Current Outpatient Medications:  .  aspirin EC 81 MG tablet, Take 1 tablet (81 mg total) by mouth daily., Disp: 30 tablet, Rfl: 0 .  atorvastatin (LIPITOR) 40 MG tablet, Take 1 tablet (40 mg total) by mouth daily., Disp: 90 tablet, Rfl: 1 .  Blood Glucose Monitoring Suppl (ACCU-CHEK AVIVA CONNECT) w/Device KIT, 1 kit by Does not apply route daily., Disp: 1 kit, Rfl: 0 .  Continuous Blood Gluc Receiver (FREESTYLE LIBRE 14 DAY READER) DEVI, 1 each by Does not apply route 4 (four) times daily., Disp: 2 Device, Rfl: 2 .  Continuous Blood Gluc Sensor (FREESTYLE LIBRE 14  DAY SENSOR) MISC, 1 each by Does not apply route 4 (four) times daily., Disp: 2 each, Rfl: 2 .  diclofenac sodium (VOLTAREN) 1 % GEL, Apply 4 g topically 4 (four) times daily., Disp: 100 g, Rfl: 2 .  Empagliflozin-Metformin HCl ER (SYNJARDY XR) 04-999 MG TB24, Take 2 tablets by mouth daily., Disp: 180 tablet, Rfl: 1 .  glucose blood (ACCU-CHEK AVIVA) test strip, Use as instructed  - four to five times daily, Disp: 100 each, Rfl: 5 .  insulin aspart (NOVOLOG FLEXPEN) 100 UNIT/ML FlexPen, Inject 5-10 Units into the skin 3 (three) times daily with meals., Disp: 15 mL, Rfl: 2 .  Insulin Degludec-Liraglutide (XULTOPHY) 100-3.6 UNIT-MG/ML SOPN, Inject 50 Units into the skin daily., Disp: 27 mL, Rfl: 1 .  Insulin Pen Needle (UNIFINE PENTIPS) 31G X 5 MM MISC, 1 each by Does not apply route 4 (four) times daily., Disp: 500 each, Rfl: 1 .  Lancets (ACCU-CHEK SOFT TOUCH) lancets, Use as instructed, Disp: 100 each, Rfl: 12 .  Multiple Vitamin (MULTIVITAMIN) capsule, Take by mouth., Disp: , Rfl:  .  olmesartan-hydrochlorothiazide (BENICAR HCT) 20-12.5 MG tablet, Take 1 tablet by mouth daily., Disp: 30 tablet, Rfl: 0 .  QUEtiapine (SEROQUEL) 25 MG tablet, Take 1 tablet (25 mg total) by mouth at bedtime., Disp: 30 tablet, Rfl: 0 .  MedroxyPROGESTERone Acetate 150 MG/ML SUSY, Inject 1 mL (150 mg  total) into the muscle once., Disp: 0.9 mL, Rfl: 0  No Known Allergies   ROS   Constitutional: Negative for fever or weight change.  Respiratory: Negative for cough and shortness of breath.   Cardiovascular: Negative for chest pain or palpitations.  Gastrointestinal: Negative for abdominal pain, no bowel changes.  Musculoskeletal: Negative for gait problem or joint swelling.  Skin: Negative for rash.  Neurological: Negative for dizziness or headache.  No other specific complaints in a complete review of systems (except as listed in HPI above).   Objective  Vitals:   01/14/18 1327  BP: (!) 152/80  Pulse: 100  Resp: 16  Temp: 98 F (36.7 C)  TempSrc: Oral  SpO2: 98%  Weight: 198 lb (89.8 kg)  Height: 5' 8.5" (1.74 m)    Body mass index is 29.67 kg/m.  Physical Exam  Constitutional: Patient appears well-developed and well-nourished. No distress.  HENT: Head: Normocephalic and atraumatic. Ears: B TMs ok, no erythema or effusion; Nose: Nose normal. Mouth/Throat: Oropharynx is clear and moist. No oropharyngeal exudate.  Eyes: Conjunctivae and EOM are normal. Pupils are equal, round, and reactive to light. No scleral icterus.  Neck: Normal range of motion. Neck supple. No JVD present. No thyromegaly present.  Cardiovascular: Normal rate, regular rhythm and normal heart sounds.  No murmur heard. No BLE edema. Pulmonary/Chest: Effort normal and breath sounds normal. No respiratory distress. Abdominal: Soft. Bowel sounds are normal, no distension. There is no tenderness. no masses Breast: no lumps or masses, no nipple discharge or rashes FEMALE GENITALIA:  External genitalia normal External urethra normal Vaginal vault normal without discharge or lesions Cervix normal without discharge or lesions Bimanual exam normal without masses RECTAL: not done Musculoskeletal: Normal range of motion, no joint effusions. No gross deformities Neurological: he is alert and oriented to  person, place, and time. No cranial nerve deficit. Coordination, balance, strength, speech and gait are normal.  Skin: Skin is warm and dry. No rash noted. No erythema.  Psychiatric: Patient has a normal mood and affect. behavior is normal. Judgment and thought content normal.  Recent Results (from the past 2160 hour(s))  POCT HgB A1C     Status: Abnormal   Collection Time: 01/05/18  9:50 AM  Result Value Ref Range   Hemoglobin A1C 12.9   HM DIABETES EYE EXAM     Status: Abnormal   Collection Time: 01/07/18 12:00 AM  Result Value Ref Range   HM Diabetic Eye Exam Retinopathy (A) No Retinopathy    Comment: Severe Nonproliferative DM Retin [facility]      PHQ2/9: Depression screen [facility] 2/9 01/05/2018 06/19/2016 03/10/2016 02/25/2016  Decreased Interest 0 0 0 0  Down, Depressed, Hopeless 0 0 0 0  PHQ - 2 Score 0 0 0 0     Fall Risk: Fall Risk  01/05/2018 06/19/2016 03/10/2016 02/25/2016  Falls in the past year? No No No No     Assessment & Plan   1. Well woman exam  Discussed importance of 150 minutes of physical activity weekly, eat two servings of fish weekly, eat one serving of tree nuts ( cashews, pistachios, pecans, almonds.Marland Kitchen) every other day, eat 6 servings of fruit/vegetables daily and drink plenty of water and avoid sweet beverages.   2. Need for immunization against influenza  - Flu Vaccine QUAD 6+ mos PF IM (Fluarix Quad PF)  3. Cervical cancer screening  - Pap IG, CT/NG NAA, and HPV (high risk)  4. Encounter for surveillance of contraceptives, unspecified contraceptive  She is on Depo , for many years, willing to try IUD - Mirena  5. Hypertension, uncontrolled  She did not fill rx yet, explained importance of getting medication filled  6. Long-term use of high-risk medication  - DG Bone Density; Future  7. Colon cancer screening  - Ambulatory referral to Gastroenterology  8. Routine screening for STI (sexually transmitted infection)  -  Hepatitis, Acute - HIV antibody - RPR  9. Breast cancer screening  - MM DIGITAL SCREENING BILATERAL; Future

## 2018-01-14 NOTE — Patient Instructions (Signed)
Preventive Care 40-64 Years, Female Preventive care refers to lifestyle choices and visits with your health care provider that can promote health and wellness. What does preventive care include?  A yearly physical exam. This is also called an annual well check.  Dental exams once or twice a year.  Routine eye exams. Ask your health care provider how often you should have your eyes checked.  Personal lifestyle choices, including: ? Daily care of your teeth and gums. ? Regular physical activity. ? Eating a healthy diet. ? Avoiding tobacco and drug use. ? Limiting alcohol use. ? Practicing safe sex. ? Taking low-dose aspirin daily starting at age 58. ? Taking vitamin and mineral supplements as recommended by your health care provider. What happens during an annual well check? The services and screenings done by your health care provider during your annual well check will depend on your age, overall health, lifestyle risk factors, and family history of disease. Counseling Your health care provider may ask you questions about your:  Alcohol use.  Tobacco use.  Drug use.  Emotional well-being.  Home and relationship well-being.  Sexual activity.  Eating habits.  Work and work Statistician.  Method of birth control.  Menstrual cycle.  Pregnancy history.  Screening You may have the following tests or measurements:  Height, weight, and BMI.  Blood pressure.  Lipid and cholesterol levels. These may be checked every 5 years, or more frequently if you are over 81 years old.  Skin check.  Lung cancer screening. You may have this screening every year starting at age 78 if you have a 30-pack-year history of smoking and currently smoke or have quit within the past 15 years.  Fecal occult blood test (FOBT) of the stool. You may have this test every year starting at age 65.  Flexible sigmoidoscopy or colonoscopy. You may have a sigmoidoscopy every 5 years or a colonoscopy  every 10 years starting at age 30.  Hepatitis C blood test.  Hepatitis B blood test.  Sexually transmitted disease (STD) testing.  Diabetes screening. This is done by checking your blood sugar (glucose) after you have not eaten for a while (fasting). You may have this done every 1-3 years.  Mammogram. This may be done every 1-2 years. Talk to your health care provider about when you should start having regular mammograms. This may depend on whether you have a family history of breast cancer.  BRCA-related cancer screening. This may be done if you have a family history of breast, ovarian, tubal, or peritoneal cancers.  Pelvic exam and Pap test. This may be done every 3 years starting at age 80. Starting at age 36, this may be done every 5 years if you have a Pap test in combination with an HPV test.  Bone density scan. This is done to screen for osteoporosis. You may have this scan if you are at high risk for osteoporosis.  Discuss your test results, treatment options, and if necessary, the need for more tests with your health care provider. Vaccines Your health care provider may recommend certain vaccines, such as:  Influenza vaccine. This is recommended every year.  Tetanus, diphtheria, and acellular pertussis (Tdap, Td) vaccine. You may need a Td booster every 10 years.  Varicella vaccine. You may need this if you have not been vaccinated.  Zoster vaccine. You may need this after age 5.  Measles, mumps, and rubella (MMR) vaccine. You may need at least one dose of MMR if you were born in  1957 or later. You may also need a second dose.  Pneumococcal 13-valent conjugate (PCV13) vaccine. You may need this if you have certain conditions and were not previously vaccinated.  Pneumococcal polysaccharide (PPSV23) vaccine. You may need one or two doses if you smoke cigarettes or if you have certain conditions.  Meningococcal vaccine. You may need this if you have certain  conditions.  Hepatitis A vaccine. You may need this if you have certain conditions or if you travel or work in places where you may be exposed to hepatitis A.  Hepatitis B vaccine. You may need this if you have certain conditions or if you travel or work in places where you may be exposed to hepatitis B.  Haemophilus influenzae type b (Hib) vaccine. You may need this if you have certain conditions.  Talk to your health care provider about which screenings and vaccines you need and how often you need them. This information is not intended to replace advice given to you by your health care provider. Make sure you discuss any questions you have with your health care provider. Document Released: 12/27/2015 Document Revised: 08/19/2016 Document Reviewed: 10/01/2015 Elsevier Interactive Patient Education  2018 Elsevier Inc.  

## 2018-01-17 ENCOUNTER — Telehealth: Payer: Self-pay | Admitting: Obstetrics & Gynecology

## 2018-01-17 LAB — HIV ANTIBODY (ROUTINE TESTING W REFLEX): HIV: NONREACTIVE

## 2018-01-17 LAB — HEPATITIS PANEL, ACUTE
HEP B C IGM: NONREACTIVE
HEP B S AG: NONREACTIVE
Hep A IgM: NONREACTIVE
Hepatitis C Ab: NONREACTIVE
SIGNAL TO CUT-OFF: 0.01 (ref <1.00)

## 2018-01-17 LAB — RPR: RPR: NONREACTIVE

## 2018-01-17 NOTE — Telephone Encounter (Signed)
[facility] referring for Encounter for surveillance of contraceptives, unspecified contraceptive. Called and left voicemail for patient to call back to be schedule.

## 2018-01-20 LAB — PAP IG, CT-NG NAA, HPV HIGH-RISK
C. trachomatis RNA, TMA: NOT DETECTED
HPV DNA HIGH RISK: NOT DETECTED
N. gonorrhoeae RNA, TMA: NOT DETECTED

## 2018-01-20 NOTE — Telephone Encounter (Signed)
Called and left voice mail for patient to call back to be schedule °

## 2018-01-25 NOTE — Telephone Encounter (Signed)
Called and left voicemail for patient to call back to be schedule. Will contract provider office.

## 2018-01-27 ENCOUNTER — Encounter: Payer: Self-pay | Admitting: *Deleted

## 2018-02-07 ENCOUNTER — Ambulatory Visit: Payer: BLUE CROSS/BLUE SHIELD | Admitting: Family Medicine

## 2018-02-09 ENCOUNTER — Telehealth: Payer: Self-pay

## 2018-02-09 NOTE — Telephone Encounter (Signed)
Gastroenterology Pre-Procedure Review  Request Date:  Requesting Physician: Dr.   PATIENT REVIEW QUESTIONS: The patient responded to the following health history questions as indicated:    1. Are you having any GI issues? no 2. Do you have a personal history of Polyps? no 3. Do you have a family history of Colon Cancer or Polyps? yes (maternal grandmother) 4. Diabetes Mellitus? yes (Type 2) 5. Joint replacements in the past 12 months?no 6. Major health problems in the past 3 months?no 7. Any artificial heart valves, MVP, or defibrillator?no    MEDICATIONS & ALLERGIES:    Patient reports the following regarding taking any anticoagulation/antiplatelet therapy:   Plavix, Coumadin, Eliquis, Xarelto, Lovenox, Pradaxa, Brilinta, or Effient? no Aspirin? yes (asa 30m)  Patient confirms/reports the following medications:  Current Outpatient Medications  Medication Sig Dispense Refill  . aspirin EC 81 MG tablet Take 1 tablet (81 mg total) by mouth daily. 30 tablet 0  . atorvastatin (LIPITOR) 40 MG tablet Take 1 tablet (40 mg total) by mouth daily. 90 tablet 1  . Blood Glucose Monitoring Suppl (ACCU-CHEK AVIVA CONNECT) w/Device KIT 1 kit by Does not apply route daily. 1 kit 0  . Continuous Blood Gluc Receiver (FREESTYLE LIBRE 14 DAY READER) DEVI 1 each by Does not apply route 4 (four) times daily. 2 Device 2  . Continuous Blood Gluc Sensor (FREESTYLE LIBRE 14 DAY SENSOR) MISC 1 each by Does not apply route 4 (four) times daily. 2 each 2  . diclofenac sodium (VOLTAREN) 1 % GEL Apply 4 g topically 4 (four) times daily. 100 g 2  . Empagliflozin-Metformin HCl ER (SYNJARDY XR) 04-999 MG TB24 Take 2 tablets by mouth daily. 180 tablet 1  . glucose blood (ACCU-CHEK AVIVA) test strip Use as instructed  - four to five times daily 100 each 5  . insulin aspart (NOVOLOG FLEXPEN) 100 UNIT/ML FlexPen Inject 5-10 Units into the skin 3 (three) times daily with meals. 15 mL 2  . Insulin Degludec-Liraglutide  (XULTOPHY) 100-3.6 UNIT-MG/ML SOPN Inject 50 Units into the skin daily. 27 mL 1  . Insulin Pen Needle (UNIFINE PENTIPS) 31G X 5 MM MISC 1 each by Does not apply route 4 (four) times daily. 500 each 1  . Lancets (ACCU-CHEK SOFT TOUCH) lancets Use as instructed 100 each 12  . MedroxyPROGESTERone Acetate 150 MG/ML SUSY Inject 1 mL (150 mg total) into the muscle once. 0.9 mL 0  . Multiple Vitamin (MULTIVITAMIN) capsule Take by mouth.    . olmesartan-hydrochlorothiazide (BENICAR HCT) 20-12.5 MG tablet Take 1 tablet by mouth daily. 30 tablet 0  . QUEtiapine (SEROQUEL) 25 MG tablet Take 1 tablet (25 mg total) by mouth at bedtime. 30 tablet 0  . SUMAtriptan (IMITREX) 100 MG tablet Take 1 tablet (100 mg total) by mouth every 2 (two) hours as needed for migraine. May repeat in 2 hours if headache persists or recurs. 10 tablet 0  . topiramate (TOPAMAX) 50 MG tablet Take 1-2 tablets (50-100 mg total) by mouth at bedtime. Start at 50 mg and after one week take two qhs 60 tablet 0   No current facility-administered medications for this visit.     Patient confirms/reports the following allergies:  No Known Allergies  No orders of the defined types were placed in this encounter.   AUTHORIZATION INFORMATION Primary Insurance: 1D#: Group #:  Secondary Insurance: 1D#: Group #:  SCHEDULE INFORMATION: Date: 02/18/18 Time: Location: [place]

## 2018-02-10 ENCOUNTER — Other Ambulatory Visit: Payer: Self-pay

## 2018-02-10 DIAGNOSIS — Z1211 Encounter for screening for malignant neoplasm of colon: Secondary | ICD-10-CM

## 2018-02-11 ENCOUNTER — Ambulatory Visit: Payer: BLUE CROSS/BLUE SHIELD | Admitting: Family Medicine

## 2018-02-15 ENCOUNTER — Ambulatory Visit: Payer: BLUE CROSS/BLUE SHIELD | Admitting: Family Medicine

## 2018-02-17 ENCOUNTER — Encounter: Payer: Self-pay | Admitting: Anesthesiology

## 2018-02-17 ENCOUNTER — Telehealth: Payer: Self-pay | Admitting: Gastroenterology

## 2018-02-17 NOTE — Discharge Instructions (Signed)
General Anesthesia, Adult, Care After °These instructions provide you with information about caring for yourself after your procedure. Your health care provider may also give you more specific instructions. Your treatment has been planned according to current medical practices, but problems sometimes occur. Call your health care provider if you have any problems or questions after your procedure. °What can I expect after the procedure? °After the procedure, it is common to have: °· Vomiting. °· A sore throat. °· Mental slowness. ° °It is common to feel: °· Nauseous. °· Cold or shivery. °· Sleepy. °· Tired. °· Sore or achy, even in parts of your body where you did not have surgery. ° °Follow these instructions at home: °For at least 24 hours after the procedure: °· Do not: °? Participate in activities where you could fall or become injured. °? Drive. °? Use heavy machinery. °? Drink alcohol. °? Take sleeping pills or medicines that cause drowsiness. °? Make important decisions or sign legal documents. °? Take care of children on your own. °· Rest. °Eating and drinking °· If you vomit, drink water, juice, or soup when you can drink without vomiting. °· Drink enough fluid to keep your urine clear or pale yellow. °· Make sure you have little or no nausea before eating solid foods. °· Follow the diet recommended by your health care provider. °General instructions °· Have a responsible adult stay with you until you are awake and alert. °· Return to your normal activities as told by your health care provider. Ask your health care provider what activities are safe for you. °· Take over-the-counter and prescription medicines only as told by your health care provider. °· If you smoke, do not smoke without supervision. °· Keep all follow-up visits as told by your health care provider. This is important. °Contact a health care provider if: °· You continue to have nausea or vomiting at home, and medicines are not helpful. °· You  cannot drink fluids or start eating again. °· You cannot urinate after 8-12 hours. °· You develop a skin rash. °· You have fever. °· You have increasing redness at the site of your procedure. °Get help right away if: °· You have difficulty breathing. °· You have chest pain. °· You have unexpected bleeding. °· You feel that you are having a life-threatening or urgent problem. °This information is not intended to replace advice given to you by your health care provider. Make sure you discuss any questions you have with your health care provider. °Document Released: 03/08/2001 Document Revised: 05/04/2016 Document Reviewed: 11/14/2015 °Elsevier Interactive Patient Education © 2018 Elsevier Inc. ° °

## 2018-02-17 NOTE — Telephone Encounter (Signed)
Patient needs to reschedule her procedure that was tomorrow. [place] the [place] time [place]. Call after 2 please

## 2018-02-18 ENCOUNTER — Ambulatory Visit
Admission: RE | Admit: 2018-02-18 | Payer: BLUE CROSS/BLUE SHIELD | Source: Ambulatory Visit | Admitting: Gastroenterology

## 2018-02-18 ENCOUNTER — Encounter: Admission: RE | Payer: Self-pay | Source: Ambulatory Visit

## 2018-02-18 SURGERY — COLONOSCOPY WITH PROPOFOL
Anesthesia: Choice

## 2018-03-02 NOTE — Telephone Encounter (Signed)
Left vm for pt to return my call to reschedule colonoscopy. 

## 2018-03-03 DIAGNOSIS — R030 Elevated blood-pressure reading, without diagnosis of hypertension: Secondary | ICD-10-CM | POA: Insufficient documentation

## 2018-03-03 DIAGNOSIS — E669 Obesity, unspecified: Secondary | ICD-10-CM | POA: Insufficient documentation

## 2019-01-02 ENCOUNTER — Telehealth: Payer: Self-pay

## 2019-01-02 DIAGNOSIS — IMO0002 Reserved for concepts with insufficient information to code with codable children: Secondary | ICD-10-CM

## 2019-01-02 DIAGNOSIS — E1140 Type 2 diabetes mellitus with diabetic neuropathy, unspecified: Secondary | ICD-10-CM

## 2019-01-02 DIAGNOSIS — E1165 Type 2 diabetes mellitus with hyperglycemia: Principal | ICD-10-CM

## 2019-01-02 NOTE — Telephone Encounter (Signed)
PA for [employer] prefers medication be written as a 30 days supply as 15 or  30 mL and a 90 day supply be written as 45 or 90 mL. Please change and resubmit to pharmacy.

## 2019-01-03 NOTE — Telephone Encounter (Signed)
Tried calling pt to schedule an appt. Number is not a working number.

## 2019-06-14 ENCOUNTER — Ambulatory Visit (INDEPENDENT_AMBULATORY_CARE_PROVIDER_SITE_OTHER): Payer: 59 | Admitting: Family Medicine

## 2019-06-14 ENCOUNTER — Encounter: Payer: Self-pay | Admitting: Family Medicine

## 2019-06-14 ENCOUNTER — Other Ambulatory Visit: Payer: Self-pay

## 2019-06-14 DIAGNOSIS — U071 COVID-19: Secondary | ICD-10-CM

## 2019-06-14 MED ORDER — AZITHROMYCIN 250 MG PO TABS: ORAL_TABLET | ORAL | 0 refills | Status: DC

## 2019-06-14 MED ORDER — ALBUTEROL SULFATE HFA 108 (90 BASE) MCG/ACT IN AERS: 2.0000 | INHALATION_SPRAY | Freq: Four times a day (QID) | RESPIRATORY_TRACT | 1 refills | Status: DC | PRN

## 2019-06-14 NOTE — Progress Notes (Signed)
 Name: Sarah Duffy   MRN: [id number]    DOB: 05/25/1971   Date:06/14/2019       Progress Note  Subjective  Chief Complaint  Chief Complaint  Patient presents with  . Follow-up    Posifive Covid 19 test seen June 10 and was released June 18th by health Department  . Cough    yellow phelgm, sinus drainage, headache  . Chest Pain    possible anxiety    I connected with  Jalin Routson  on 06/14/19 at 10:20 AM EDT by a video enabled telemedicine application and verified that I am speaking with the correct person using two identifiers.  I discussed the limitations of evaluation and management by telemedicine and the availability of in person appointments. The patient expressed understanding and agreed to proceed. Staff also discussed with the patient that there may be a patient responsible charge related to this service. Patient Location: Home Provider Location: Home Additional Individuals present: None  HPI  Pt presents with concern for COVID19 Exposure.  Someone at her son's job had tested positive, her son then had to have testing, and his result was negative, her grandson's test was also negative.  She went to [facility] for testing 05/24/2019 and had testing and her test came back positive.  She was then cleared by Health deparment yesterday for return to work.  She did have some sinus congestion for a few days, no persistent cough.  No fevers/chills, shortness of breath.  Her symptom onset was 05/17/2019 and was cleared by the health department on 06/01/2019 to return to work. - She notes that a few days ago she started feeling like she is starting to have some chest tightness, headache, and some phlegm in the throat. She has tried mucinex without relief.   She switched her insurance and needs refills of her medications.  She has been out of her medications since January 2019 due to lack of insurance.   Patient Active Problem List   Diagnosis Date Noted  . Migraine headache without aura  01/14/2018  . 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(HCC) 05/22/2017  . Osteomyelitis of foot (HCC) 08/26/2016  . Diabetic retinopathy of left eye, with macular edema, with severe nonproliferative retinopathy, associated with type 2 diabetes mellitus 05/21/2016  . 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 Hyperlipidemia 03/10/2016  . MRSA infection within last 3 months 02/25/2016  . MI (mitral incompetence) 02/25/2016  . Foot pain 02/25/2016  . Insomnia 02/25/2016  . Uncontrolled type 2 diabetes mellitus with foot ulcer (HCC) 02/03/2016    Social History   Tobacco Use  . Smoking status: Never Smoker  . Smokeless tobacco: Never Used  Substance Use Topics  . Alcohol use: No    Alcohol/week: 0.0 standard drinks     Current Outpatient Medications:  .  aspirin EC 81 MG tablet, Take 1 tablet (81 mg total) by mouth daily., Disp: 30 tablet, Rfl: 0 .  Multiple Vitamin (MULTIVITAMIN) capsule, Take by mouth., Disp: , Rfl:  .  atorvastatin (LIPITOR) 40 MG tablet, Take 1 tablet (40 mg total) by mouth daily. (Patient not taking: Reported on 06/14/2019), Disp: 90 tablet, Rfl: 1 .  Blood Glucose Monitoring Suppl (ACCU-CHEK AVIVA CONNECT) w/Device KIT, 1 kit by Does not apply route daily. (Patient not taking: Reported on 06/14/2019), Disp: 1 kit, Rfl: 0 .  Continuous Blood Gluc Receiver (FREESTYLE LIBRE 14 DAY READER) DEVI, 1 each by Does   not apply route 4 (four) times daily. (Patient not taking: Reported on 06/14/2019), Disp: 2 Device, Rfl: 2 .  Continuous Blood Gluc Sensor (FREESTYLE LIBRE 14 DAY SENSOR) MISC, 1 each by Does not apply route 4 (four) times daily. (Patient not taking: Reported on 06/14/2019), Disp: 2 each, Rfl: 2 .  diclofenac sodium (VOLTAREN) 1 % GEL, Apply 4 g topically 4 (four) times daily. (Patient not taking: Reported on 06/14/2019), Disp: 100 g, Rfl: 2 .  Empagliflozin-Metformin HCl ER  (SYNJARDY XR) 04-999 MG TB24, Take 2 tablets by mouth daily. (Patient not taking: Reported on 06/14/2019), Disp: 180 tablet, Rfl: 1 .  glucose blood (ACCU-CHEK AVIVA) test strip, Use as instructed  - four to five times daily (Patient not taking: Reported on 06/14/2019), Disp: 100 each, Rfl: 5 .  insulin aspart (NOVOLOG FLEXPEN) 100 UNIT/ML FlexPen, Inject 5-10 Units into the skin 3 (three) times daily with meals. (Patient not taking: Reported on 06/14/2019), Disp: 15 mL, Rfl: 2 .  Insulin Degludec-Liraglutide (XULTOPHY) 100-3.6 UNIT-MG/ML SOPN, Inject 50 Units into the skin daily. (Patient not taking: Reported on 06/14/2019), Disp: 27 mL, Rfl: 1 .  Insulin Pen Needle (UNIFINE PENTIPS) 31G X 5 MM MISC, 1 each by Does not apply route 4 (four) times daily. (Patient not taking: Reported on 06/14/2019), Disp: 500 each, Rfl: 1 .  Lancets (ACCU-CHEK SOFT TOUCH) lancets, Use as instructed (Patient not taking: Reported on 06/14/2019), Disp: 100 each, Rfl: 12 .  MedroxyPROGESTERone Acetate 150 MG/ML SUSY, Inject 1 mL (150 mg total) into the muscle once., Disp: 0.9 mL, Rfl: 0 .  olmesartan-hydrochlorothiazide (BENICAR HCT) 20-12.5 MG tablet, Take 1 tablet by mouth daily. (Patient not taking: Reported on 06/14/2019), Disp: 30 tablet, Rfl: 0 .  QUEtiapine (SEROQUEL) 25 MG tablet, Take 1 tablet (25 mg total) by mouth at bedtime. (Patient not taking: Reported on 06/14/2019), Disp: 30 tablet, Rfl: 0 .  SUMAtriptan (IMITREX) 100 MG tablet, Take 1 tablet (100 mg total) by mouth every 2 (two) hours as needed for migraine. May repeat in 2 hours if headache persists or recurs. (Patient not taking: Reported on 06/14/2019), Disp: 10 tablet, Rfl: 0 .  topiramate (TOPAMAX) 50 MG tablet, Take 1-2 tablets (50-100 mg total) by mouth at bedtime. Start at 50 mg and after one week take two qhs (Patient not taking: Reported on 06/14/2019), Disp: 60 tablet, Rfl: 0  No Known Allergies  I personally reviewed active problem list, medication list, allergies,  notes from last encounter with the patient/caregiver today.  ROS  Ten systems reviewed and is negative except as mentioned in HPI  Objective  Virtual encounter, vitals not obtained.  There is no height or weight on file to calculate BMI.  Nursing Note and Vital Signs reviewed.  Physical Exam  Constitutional: Patient appears well-developed and well-nourished. No distress.  HENT: Head: Normocephalic and atraumatic.  Neck: Normal range of motion. Pulmonary/Chest: Effort normal. No respiratory distress. Speaking in complete sentences Neurological: Pt is alert and oriented to person, place, and time. Coordination, speech and gait are normal.  Psychiatric: Patient has a normal mood and affect. behavior is normal. Judgment and thought content normal.  No results found for this or any previous visit (from the past 72 hour(s)).  Assessment & Plan  1. COVID-19 virus infection - albuterol (VENTOLIN HFA) 108 (90 Base) MCG/ACT inhaler; Inhale 2 puffs into the lungs every 6 (six) hours as needed for wheezing or shortness of breath.  Dispense: 18 g; Refill: 1 - azithromycin (ZITHROMAX) 250  MG tablet; Day 1: Take 2 tablets, Days 2-5: Take 1 tablet  Dispense: 6 tablet; Refill: 0  She has been completely out of her medications for over a year now due to insurance and financial issues.  I advised that she needs to physically come into the office in 2 weeks (>30 days since COVID-19 infection was detected, and as long as she is asymptomatic) for BP check, labs, etc.  Will schedule with front desk.  -Red flags and when to present for emergency care or RTC including fever >101.4F, chest pain, shortness of breath, new/worsening/un-resolving symptoms, reviewed with patient at time of visit. Follow up and care instructions discussed and provided in AVS. - I discussed the assessment and treatment plan with the patient. The patient was provided an opportunity to ask questions and all were answered. The patient  agreed with the plan and demonstrated an understanding of the instructions.  I provided 16 minutes of non-face-to-face time during this encounter.  Emily E Boyce, FNP   

## 2019-06-21 ENCOUNTER — Encounter: Payer: Self-pay | Admitting: Family Medicine

## 2019-06-21 ENCOUNTER — Other Ambulatory Visit: Payer: Self-pay

## 2019-06-21 ENCOUNTER — Ambulatory Visit (INDEPENDENT_AMBULATORY_CARE_PROVIDER_SITE_OTHER): Payer: 59 | Admitting: Family Medicine

## 2019-06-21 DIAGNOSIS — U071 COVID-19: Secondary | ICD-10-CM

## 2019-06-21 NOTE — Progress Notes (Signed)
Name: Sarah Duffy   MRN: [id number]    DOB: Jun 07, 1971   Date:06/21/2019       Progress Note  Subjective  Chief Complaint  Chief Complaint  Patient presents with   Follow-up    1 week recheck   Heartburn    I connected with  [name]  on 06/21/19 at  7:20 AM EDT by a video enabled telemedicine application and verified that I am speaking with the correct person using two identifiers.  I discussed the limitations of evaluation and management by telemedicine and the availability of in person appointments. The patient expressed understanding and agreed to proceed. Staff also discussed with the patient that there may be a patient responsible charge related to this service. Patient Location: Home Provider Location: Home Additional Individuals present: None  HPI  Pt presents for follow up - she was infected with COVID-19, was feeling better and was cleared by the Health Dept after a 14-day quarantine, however she started to develop some second-sickening symptoms and was started on Azithromycin to cover for possible CAP last week. Today she is feeling better - cough has improved, shortness of breath is improved. Is having some mild heart burn, but this is improving on its own. Only one headache since last week, but still has occasional dizziness.  No fevers/chills. She is scheduled to come in to our office on Friday for routine follow up as this will be 30 days since her symptom onset and she is improving.  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 associated with type 2 diabetes mellitus ([place])  05/21/2016   Hyperlipidemia 03/10/2016   MRSA infection within last 3 months 02/25/2016   MI (mitral incompetence) 02/25/2016   Foot pain 02/25/2016   Insomnia 02/25/2016   Uncontrolled type 2 diabetes mellitus with foot ulcer ([place]) 02/03/2016    Social History   Tobacco Use   Smoking status: Never Smoker   Smokeless tobacco: Never Used  Substance Use Topics   Alcohol use: No    Alcohol/week: 0.0 standard drinks     Current Outpatient Medications:    albuterol (VENTOLIN HFA) 108 (90 Base) MCG/ACT inhaler, Inhale 2 puffs into the lungs every 6 (six) hours as needed for wheezing or shortness of breath., Disp: 18 g, Rfl: 1   aspirin EC 81 MG tablet, Take 1 tablet (81 mg total) by mouth daily., Disp: 30 tablet, Rfl: 0   olmesartan-hydrochlorothiazide (BENICAR HCT) 20-12.5 MG tablet, Take 1 tablet by mouth daily., Disp: 30 tablet, Rfl: 0   QUEtiapine (SEROQUEL) 25 MG tablet, Take 1 tablet (25 mg total) by mouth at bedtime., Disp: 30 tablet, Rfl: 0   SUMAtriptan (IMITREX) 100 MG tablet, Take 1 tablet (100 mg total) by mouth every 2 (two) hours as needed for migraine. May repeat in 2 hours if headache persists or recurs., Disp: 10 tablet, Rfl: 0   topiramate (TOPAMAX) 50 MG tablet, Take 1-2 tablets (50-100 mg total) by mouth at bedtime. Start at 50 mg and after one week take two qhs, Disp: 60 tablet, Rfl: 0   atorvastatin (LIPITOR) 40 MG tablet, Take 1 tablet (40 mg total) by mouth daily. (Patient not taking:  Reported on 06/14/2019), Disp: 90 tablet, Rfl: 1   azithromycin (ZITHROMAX) 250 MG tablet, Day 1: Take 2 tablets, Days 2-5: Take 1 tablet (Patient not taking: Reported on 06/21/2019), Disp: 6 tablet, Rfl: 0   Blood Glucose Monitoring Suppl (ACCU-CHEK AVIVA CONNECT) w/Device KIT, 1 kit by Does not apply route daily. (Patient not taking: Reported on 06/14/2019), Disp: 1 kit, Rfl: 0   Continuous Blood Gluc Receiver (FREESTYLE LIBRE 14 DAY READER) DEVI, 1 each by Does not apply  route 4 (four) times daily. (Patient not taking: Reported on 06/14/2019), Disp: 2 Device, Rfl: 2   Continuous Blood Gluc Sensor (FREESTYLE LIBRE 14 DAY SENSOR) MISC, 1 each by Does not apply route 4 (four) times daily. (Patient not taking: Reported on 06/14/2019), Disp: 2 each, Rfl: 2   diclofenac sodium (VOLTAREN) 1 % GEL, Apply 4 g topically 4 (four) times daily. (Patient not taking: Reported on 06/14/2019), Disp: 100 g, Rfl: 2   Empagliflozin-Metformin HCl ER (SYNJARDY XR) 04-999 MG TB24, Take 2 tablets by mouth daily. (Patient not taking: Reported on 06/14/2019), Disp: 180 tablet, Rfl: 1   glucose blood (ACCU-CHEK AVIVA) test strip, Use as instructed  - four to five times daily (Patient not taking: Reported on 06/14/2019), Disp: 100 each, Rfl: 5   insulin aspart (NOVOLOG FLEXPEN) 100 UNIT/ML FlexPen, Inject 5-10 Units into the skin 3 (three) times daily with meals. (Patient not taking: Reported on 06/14/2019), Disp: 15 mL, Rfl: 2   Insulin Degludec-Liraglutide (XULTOPHY) 100-3.6 UNIT-MG/ML SOPN, Inject 50 Units into the skin daily. (Patient not taking: Reported on 06/14/2019), Disp: 27 mL, Rfl: 1   Insulin Pen Needle (UNIFINE PENTIPS) 31G X 5 MM MISC, 1 each by Does not apply route 4 (four) times daily. (Patient not taking: Reported on 06/14/2019), Disp: 500 each, Rfl: 1   Lancets (ACCU-CHEK SOFT TOUCH) lancets, Use as instructed (Patient not taking: Reported on 06/21/2019), Disp: 100 each, Rfl: 12   MedroxyPROGESTERone Acetate 150 MG/ML SUSY, Inject 1 mL (150 mg total) into the muscle once., Disp: 0.9 mL, Rfl: 0   Multiple Vitamin (MULTIVITAMIN) capsule, Take by mouth., Disp: , Rfl:   No Known Allergies  I personally reviewed active problem list, medication list, allergies, notes from last encounter with the patient/caregiver today.  ROS  Ten systems reviewed and is negative except as mentioned in HPI  Objective  Virtual encounter, vitals not obtained.  There is no height or weight on file to  calculate BMI.  Nursing Note and Vital Signs reviewed.  Physical Exam  Constitutional: Patient appears well-developed and well-nourished. No distress.  HENT: Head: Normocephalic and atraumatic.  Neck: Normal range of motion. Pulmonary/Chest: Effort normal. No respiratory distress. Speaking in complete sentences Neurological: Pt is alert and oriented to person, place, and time. Coordination, speech are normal.  Psychiatric: Patient has a normal mood and affect. behavior is normal. Judgment and thought content normal.  No results found for this or any previous visit (from the past 72 hour(s)).  Assessment & Plan  1. COVID-19 virus infection - We will clear her to return to work after her Friday appointment.  She is doing better, still having occasional cough.  Her job is asking that she be retested until negative, however this is not CDC guidelines, and we will provide a note stating such.  Clinically she is improved, it will be 30 days since symptoms onset - twice the recommended time for quarantine, and I feel comfortable returning her to work on 06/26/2019.  -Red flags  and when to present for emergency care or RTC including fever >101.27F, chest pain, shortness of breath, new/worsening/un-resolving symptoms, reviewed with patient at time of visit. Follow up and care instructions discussed and provided in AVS. - I discussed the assessment and treatment plan with the patient. The patient was provided an opportunity to ask questions and all were answered. The patient agreed with the plan and demonstrated an understanding of the instructions.  I provided 14 minutes of non-face-to-face time during this encounter.  Hubbard Hartshorn, FNP

## 2019-06-23 ENCOUNTER — Ambulatory Visit: Payer: BLUE CROSS/BLUE SHIELD | Admitting: Family Medicine

## 2019-06-23 ENCOUNTER — Encounter: Payer: Self-pay | Admitting: Family Medicine

## 2019-06-23 ENCOUNTER — Other Ambulatory Visit: Payer: Self-pay

## 2019-06-23 VITALS — BP 142/84 | HR 106 | Temp 96.8°F | Resp 14 | Ht 69.0 in | Wt 180.2 lb

## 2019-06-23 DIAGNOSIS — Z8616 Personal history of COVID-19: Secondary | ICD-10-CM

## 2019-06-23 DIAGNOSIS — E113491 Type 2 diabetes mellitus with severe nonproliferative diabetic retinopathy without macular edema, right eye: Secondary | ICD-10-CM

## 2019-06-23 DIAGNOSIS — I340 Nonrheumatic mitral (valve) insufficiency: Secondary | ICD-10-CM

## 2019-06-23 DIAGNOSIS — G43009 Migraine without aura, not intractable, without status migrainosus: Secondary | ICD-10-CM

## 2019-06-23 DIAGNOSIS — Z8619 Personal history of other infectious and parasitic diseases: Secondary | ICD-10-CM

## 2019-06-23 DIAGNOSIS — R809 Proteinuria, unspecified: Secondary | ICD-10-CM

## 2019-06-23 DIAGNOSIS — E782 Mixed hyperlipidemia: Secondary | ICD-10-CM

## 2019-06-23 DIAGNOSIS — E1129 Type 2 diabetes mellitus with other diabetic kidney complication: Secondary | ICD-10-CM

## 2019-06-23 DIAGNOSIS — F5101 Primary insomnia: Secondary | ICD-10-CM

## 2019-06-23 DIAGNOSIS — I10 Essential (primary) hypertension: Secondary | ICD-10-CM

## 2019-06-23 MED ORDER — FREESTYLE LIBRE 14 DAY READER DEVI: 1.0000 | Freq: Four times a day (QID) | 2 refills | Status: DC

## 2019-06-23 MED ORDER — OLMESARTAN MEDOXOMIL-HCTZ 20-12.5 MG PO TABS: 1.0000 | ORAL_TABLET | Freq: Every day | ORAL | 0 refills | Status: DC

## 2019-06-23 MED ORDER — FREESTYLE LIBRE 14 DAY SENSOR MISC: 1.0000 | Freq: Four times a day (QID) | 2 refills | Status: DC

## 2019-06-23 MED ORDER — TOPIRAMATE 50 MG PO TABS: 50.0000 mg | ORAL_TABLET | Freq: Every day | ORAL | 0 refills | Status: DC

## 2019-06-23 MED ORDER — SUMATRIPTAN SUCCINATE 100 MG PO TABS: 100.0000 mg | ORAL_TABLET | ORAL | 0 refills | Status: DC | PRN

## 2019-06-23 NOTE — Progress Notes (Signed)
Name: Sarah Duffy   MRN: [id number]    DOB: 1971/09/06   Date:06/23/2019       Progress Note  Subjective  Chief Complaint  Chief Complaint  Patient presents with  . Follow-up  . Medication Refill    HPI  COVID-19 Infection: She is asymptomatic at this time. It has been 30 days since her symptoms started. She is having joint stiffness ongoing - right hand, bilateral knees and hips.  We will hold off on inflammatory markers at this time as they will likely be elevated secondary to recent infection, however if persistent in 3 months, we will check labs.   Mitral Incompetence: She was seeing Dr. Rockey Situ in 2017 for shortness of breath and they found mitral valve incompetence.  She was told to return PRN.  Shortness of breath has been well controlled; no chest pain.  Hypertension: did not have insurance and has been off of benicar for over a year.  BP is elevated, denies chest pain or palpitation, no BLE edema.   Migraine headaches: she states she was diagnosed many years ago, but was doing well, until a few years agp when she tried Norplent, it was removed but she continues to have episodes. She states headache starts on nuchal area and radiates to left frontal area and behind left eye. Described throbbing and associated with nausea and vomiting. Also has photophobia and phonophobia, she has missed to days at work because of it. She states currently she has been taking tylenol prn. She states episodes are happening a few times a month, she also has some dull headaches not as intense a few times a week. Ran out of imitrex but this work works well for her.  Getting 10 migraines in a month. We will also restart topamax.   Diabetes mellitus type 2 - Lost insurance and lost follow up, has not been on her meds in over a year. Checking sugars?  no - needs a new monitor, would like to attend DM classes again. She is eating some sweets but has cut back quite a bit, drinking diet sodas, low sugar  yogurt, Checking feet every day/night?  yes Last eye exam:  Overdue Denies: Polyuria, polydipsia, polyphagia, vision changes, or neuropathy. Most recent A1C:  Lab Results  Component Value Date   HGBA1C 12.9 01/05/2018    We will recheck today. Last CMP Results : is due for repeat today    Component Value Date/Time   NA 136 02/28/2016 0951   K 4.9 02/28/2016 0951   CL 95 (L) 02/28/2016 0951   CO2 20 02/28/2016 0951   GLUCOSE 276 (H) 02/28/2016 0951   GLUCOSE 362 (H) 01/31/2016 2212   BUN 11 02/28/2016 0951   CREATININE 0.74 02/28/2016 0951   CALCIUM 9.3 02/28/2016 0951   PROT 7.6 02/28/2016 0951   ALBUMIN 3.9 02/28/2016 0951   AST 19 02/28/2016 0951   ALT 12 02/28/2016 0951   ALKPHOS 81 02/28/2016 0951   BILITOT 0.4 02/28/2016 0951   GFRNONAA 97 02/28/2016 0951   GFRAA 112 02/28/2016 0951   Urine Micro UTD? No Current Medication Management: Diabetic Medications:  ACEI/ARB: No Statin: No, will start after statins Aspirin therapy: Yes  Insomnia: She has struggled for years with this; she has tried melatonin,. Trazodone, and seroquel.  Will research belsomra and let us know if she wants to try it.   Patient Active Problem List   Diagnosis Date Noted  . Migraine headache without aura 01/14/2018  . 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 Osteomyelitis of foot ([place]) 08/26/2016  . Diabetic retinopathy of left eye, with macular edema, with severe nonproliferative retinopathy, associated with type 2 diabetes mellitus 05/21/2016  . 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 Hyperlipidemia 03/10/2016  . MRSA infection within last 3 months 02/25/2016  . MI (mitral incompetence) 02/25/2016  . Foot pain 02/25/2016  . Insomnia 02/25/2016  . Uncontrolled type 2 diabetes mellitus with foot ulcer ([place]) 02/03/2016    Past Surgical History:  Procedure Laterality Date   . CHOLECYSTECTOMY  1999  . TOE SURGERY Left 02/07/2016   Pinky Toe    Family History  Problem Relation Age of Onset  . Diabetes Mother   . Heart disease Father   . AAA (abdominal aortic aneurysm) Father   . Heart attack Sister     Social History   Socioeconomic History  . Marital status: Single    Spouse name: Not on file  . Number of children: 1  . Years of education: Not on file  . Highest education level: Some college, no degree  Occupational History  . Occupation: Public affairs consultant   Social Needs  . Financial resource strain: Not on file  . Food insecurity    Worry: Not on file    Inability: Not on file  . Transportation needs    Medical: Not on file    Non-medical: Not on file  Tobacco Use  . Smoking status: Never Smoker  . Smokeless tobacco: Never Used  Substance and Sexual Activity  . Alcohol use: No    Alcohol/week: 0.0 standard drinks  . Drug use: No  . Sexual activity: Yes    Partners: Male    Birth control/protection: Injection  Lifestyle  . Physical activity    Days per week: Not on file    Minutes per session: Not on file  . Stress: Not on file  Relationships  . Social Herbalist on phone: Not on file    Gets together: Not on file    Attends religious service: Not on file    Active member of club or organization: Not on file    Attends meetings of clubs or organizations: Not on file    Relationship status: Not on file  . Intimate partner violence    Fear of current or ex partner: Not on file    Emotionally abused: Not on file    Physically abused: Not on file    Forced sexual activity: Not on file  Other Topics Concern  . Not on file  Social History Narrative   She used to work at [employer], but  Feb 2017 she left work because of  MRSA infection.osteomyelitis and uncontrolled DM. She has been back to work since Feb 2018   Lives alone     Current Outpatient Medications:  .  albuterol (VENTOLIN HFA) 108 (90 Base) MCG/ACT  inhaler, Inhale 2 puffs into the lungs every 6 (six) hours as needed for wheezing or shortness of breath., Disp: 18 g, Rfl: 1 .  aspirin EC 81 MG tablet, Take 1 tablet (81 mg total) by mouth daily., Disp: 30 tablet, Rfl: 0 .  atorvastatin (LIPITOR) 40 MG tablet, Take 1 tablet (40 mg total) by mouth daily. (Patient not taking: Reported on 06/14/2019), Disp: 90 tablet, Rfl: 1 .  Blood Glucose Monitoring Suppl (ACCU-CHEK AVIVA CONNECT) w/Device KIT, 1 kit by Does  not apply route daily. (Patient not taking: Reported on 06/14/2019), Disp: 1 kit, Rfl: 0 .  Continuous Blood Gluc Receiver (FREESTYLE LIBRE 14 DAY READER) DEVI, 1 each by Does not apply route 4 (four) times daily. (Patient not taking: Reported on 06/14/2019), Disp: 2 Device, Rfl: 2 .  Continuous Blood Gluc Sensor (FREESTYLE LIBRE 14 DAY SENSOR) MISC, 1 each by Does not apply route 4 (four) times daily. (Patient not taking: Reported on 06/14/2019), Disp: 2 each, Rfl: 2 .  diclofenac sodium (VOLTAREN) 1 % GEL, Apply 4 g topically 4 (four) times daily. (Patient not taking: Reported on 06/14/2019), Disp: 100 g, Rfl: 2 .  Empagliflozin-Metformin HCl ER (SYNJARDY XR) 04-999 MG TB24, Take 2 tablets by mouth daily. (Patient not taking: Reported on 06/14/2019), Disp: 180 tablet, Rfl: 1 .  glucose blood (ACCU-CHEK AVIVA) test strip, Use as instructed  - four to five times daily (Patient not taking: Reported on 06/14/2019), Disp: 100 each, Rfl: 5 .  insulin aspart (NOVOLOG FLEXPEN) 100 UNIT/ML FlexPen, Inject 5-10 Units into the skin 3 (three) times daily with meals. (Patient not taking: Reported on 06/14/2019), Disp: 15 mL, Rfl: 2 .  Insulin Degludec-Liraglutide (XULTOPHY) 100-3.6 UNIT-MG/ML SOPN, Inject 50 Units into the skin daily. (Patient not taking: Reported on 06/14/2019), Disp: 27 mL, Rfl: 1 .  Insulin Pen Needle (UNIFINE PENTIPS) 31G X 5 MM MISC, 1 each by Does not apply route 4 (four) times daily. (Patient not taking: Reported on 06/14/2019), Disp: 500 each, Rfl: 1 .   Lancets (ACCU-CHEK SOFT TOUCH) lancets, Use as instructed (Patient not taking: Reported on 06/21/2019), Disp: 100 each, Rfl: 12 .  MedroxyPROGESTERone Acetate 150 MG/ML SUSY, Inject 1 mL (150 mg total) into the muscle once., Disp: 0.9 mL, Rfl: 0 .  Multiple Vitamin (MULTIVITAMIN) capsule, Take by mouth., Disp: , Rfl:  .  olmesartan-hydrochlorothiazide (BENICAR HCT) 20-12.5 MG tablet, Take 1 tablet by mouth daily., Disp: 30 tablet, Rfl: 0 .  QUEtiapine (SEROQUEL) 25 MG tablet, Take 1 tablet (25 mg total) by mouth at bedtime., Disp: 30 tablet, Rfl: 0 .  SUMAtriptan (IMITREX) 100 MG tablet, Take 1 tablet (100 mg total) by mouth every 2 (two) hours as needed for migraine. May repeat in 2 hours if headache persists or recurs., Disp: 10 tablet, Rfl: 0 .  topiramate (TOPAMAX) 50 MG tablet, Take 1-2 tablets (50-100 mg total) by mouth at bedtime. Start at 50 mg and after one week take two qhs, Disp: 60 tablet, Rfl: 0  No Known Allergies  I personally reviewed active problem list, medication list, allergies, notes from last encounter, lab results with the patient/caregiver today.   ROS  Ten systems reviewed and is negative except as mentioned in HPI  Objective  Vitals:   06/23/19 1324  BP: (!) 142/84  Pulse: (!) 106  Resp: 14  Temp: (!) 96.8 F (36 C)  TempSrc: Oral  SpO2: 96%  Weight: 180 lb 3.2 oz (81.7 kg)  Height: 5' 9"  (1.753 m)   Body mass index is 26.61 kg/m.  Physical Exam  Constitutional: Patient appears well-developed and well-nourished. No distress.  HENT: Head: Normocephalic and atraumatic.  Eyes: Conjunctivae and EOM are normal. No scleral icterus. Neck: Normal range of motion. Neck supple. No JVD present. No thyromegaly present.  Cardiovascular: Normal rate, regular rhythm and normal heart sounds.  No murmur heard. No BLE edema. Pulmonary/Chest: Effort normal and breath sounds normal. No respiratory distress. Musculoskeletal: Normal range of motion, no joint effusions. No  gross deformities  Neurological: Pt is alert and oriented to person, place, and time. No cranial nerve deficit. Coordination, balance, strength, speech and gait are normal.  Skin: Skin is warm and dry. No rash noted. No erythema.  Psychiatric: Patient has a normal mood and affect. behavior is normal. Judgment and thought content normal.  No results found for this or any previous visit (from the past 72 hour(s)).  PHQ2/9: Depression screen [facility] 2/9 06/21/2019 06/14/2019 01/05/2018 06/19/2016 03/10/2016  Decreased Interest 0 1 0 0 0  Down, Depressed, Hopeless 0 1 0 0 0  PHQ - 2 Score 0 2 0 0 0  Altered sleeping 0 1 - - -  Tired, decreased energy 0 1 - - -  Change in appetite 0 1 - - -  Feeling bad or failure about yourself  0 1 - - -  Trouble concentrating 0 1 - - -  Moving slowly or fidgety/restless 0 0 - - -  Suicidal thoughts 0 0 - - -  PHQ-9 Score 0 7 - - -  Difficult doing work/chores Not difficult at all Not difficult at all - - -   PHQ-2/9 Result is negative.    Fall Risk: Fall Risk  06/21/2019 06/14/2019 01/05/2018 06/19/2016 03/10/2016  Falls in the past year? 0 0 No No No  Number falls in past yr: 0 0 - - -  Injury with Fall? 0 0 - - -  Follow up Falls evaluation completed Falls evaluation completed - - -   Assessment & Plan  1. 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(HCC) - COMPLETE METABOLIC PANEL WITH GFR - Lipid panel - Hemoglobin A1c - Continuous Blood Gluc Sensor (FREESTYLE LIBRE 14 DAY SENSOR) MISC; 1 each by Does not apply route 4 (four) times daily.  Dispense: 2 each; Refill: 2 - Continuous Blood Gluc Receiver (FREESTYLE LIBRE 14 DAY READER) DEVI; 1 each by Does not apply route 4 (four) times daily.  Dispense: 2 Device; Refill: 2 - Ambulatory referral to diabetic education - Urine Microalbumin w/creat. ratio - Ambulatory referral to Ophthalmology  2. 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  Dispense: 2 each; Refill: 2 - Continuous Blood Gluc Receiver (FREESTYLE LIBRE 14 DAY READER) DEVI; 1 each by Does not apply route 4 (four) times daily.  Dispense: 2 Device; Refill: 2 - Ambulatory referral to Ophthalmology  3. Mixed hyperlipidemia - Lipid panel  4. Hypertension, uncontrolled - olmesartan-hydrochlorothiazide (BENICAR HCT) 20-12.5 MG tablet; Take 1 tablet by mouth daily.  Dispense: 30 tablet; Refill: 0  5. Migraine without aura and without status migrainosus, not intractable - SUMAtriptan (IMITREX) 100 MG tablet; Take 1 tablet (100 mg total) by mouth every 2 (two) hours as needed for migraine. May repeat in 2 hours if headache persists or recurs.  Dispense: 10 tablet; Refill: 0 - topiramate (TOPAMAX) 50 MG tablet; Take 1-2 tablets (50-100 mg total) by mouth at bedtime. Start at 50 mg and after one week take two qhs  Dispense: 60 tablet; Refill: 0  6. Primary insomnia - Consider Belsomra  7. Mitral valve insufficiency, unspecified etiology - Stable  8. History of 2019 novel coronavirus disease (COVID-19) - Stable

## 2019-06-23 NOTE — Patient Instructions (Addendum)
Belsomra - for sleep  Please call to schedule an eye examination ASAP   Diabetes Mellitus and Nutrition, Adult When you have diabetes (diabetes mellitus), it is very important to have healthy eating habits because your blood sugar (glucose) levels are greatly affected by what you eat and drink. Eating healthy foods in the appropriate amounts, at about the same times every day, can help you:  Control your blood glucose.  Lower your risk of heart disease.  Improve your blood pressure.  Reach or maintain a healthy weight. Every person with diabetes is different, and each person has different needs for a meal plan. Your health care provider may recommend that you work with a diet and nutrition specialist (dietitian) to make a meal plan that is best for you. Your meal plan may vary depending on factors such as:  The calories you need.  The medicines you take.  Your weight.  Your blood glucose, blood pressure, and cholesterol levels.  Your activity level.  Other health conditions you have, such as heart or kidney disease. How do carbohydrates affect me? Carbohydrates, also called carbs, affect your blood glucose level more than any other type of food. Eating carbs naturally raises the amount of glucose in your blood. Carb counting is a method for keeping track of how many carbs you eat. Counting carbs is important to keep your blood glucose at a healthy level, especially if you use insulin or take certain oral diabetes medicines. It is important to know how many carbs you can safely have in each meal. This is different for every person. Your dietitian can help you calculate how many carbs you should have at each meal and for each snack. Foods that contain carbs include:  Bread, cereal, rice, pasta, and crackers.  Potatoes and corn.  Peas, beans, and lentils.  Milk and yogurt.  Fruit and juice.  Desserts, such as cakes, cookies, ice cream, and candy. How does alcohol affect  me? Alcohol can cause a sudden decrease in blood glucose (hypoglycemia), especially if you use insulin or take certain oral diabetes medicines. Hypoglycemia can be a life-threatening condition. Symptoms of hypoglycemia (sleepiness, dizziness, and confusion) are similar to symptoms of having too much alcohol. If your health care provider says that alcohol is safe for you, follow these guidelines:  Limit alcohol intake to no more than 1 drink per day for nonpregnant women and 2 drinks per day for men. One drink equals 12 oz of beer, 5 oz of wine, or 1 oz of hard liquor.  Do not drink on an empty stomach.  Keep yourself hydrated with water, diet soda, or unsweetened iced tea.  Keep in mind that regular soda, juice, and other mixers may contain a lot of sugar and must be counted as carbs. What are tips for following this plan?  Reading food labels  Start by checking the serving size on the "Nutrition Facts" label of packaged foods and drinks. The amount of calories, carbs, fats, and other nutrients listed on the label is based on one serving of the item. Many items contain more than one serving per package.  Check the total grams (g) of carbs in one serving. You can calculate the number of servings of carbs in one serving by dividing the total carbs by 15. For example, if a food has 30 g of total carbs, it would be equal to 2 servings of carbs.  Check the number of grams (g) of saturated and trans fats in one serving. Choose  foods that have low or no amount of these fats.  Check the number of milligrams (mg) of salt (sodium) in one serving. Most people should limit total sodium intake to less than 2,300 mg per day.  Always check the nutrition information of foods labeled as "low-fat" or "nonfat". These foods may be higher in added sugar or refined carbs and should be avoided.  Talk to your dietitian to identify your daily goals for nutrients listed on the label. Shopping  Avoid buying  canned, premade, or processed foods. These foods tend to be high in fat, sodium, and added sugar.  Shop around the outside edge of the grocery store. This includes fresh fruits and vegetables, bulk grains, fresh meats, and fresh dairy. Cooking  Use low-heat cooking methods, such as baking, instead of high-heat cooking methods like deep frying.  Cook using healthy oils, such as olive, canola, or sunflower oil.  Avoid cooking with butter, cream, or high-fat meats. Meal planning  Eat meals and snacks regularly, preferably at the same times every day. Avoid going long periods of time without eating.  Eat foods high in fiber, such as fresh fruits, vegetables, beans, and whole grains. Talk to your dietitian about how many servings of carbs you can eat at each meal.  Eat 4-6 ounces (oz) of lean protein each day, such as lean meat, chicken, fish, eggs, or tofu. One oz of lean protein is equal to: ? 1 oz of meat, chicken, or fish. ? 1 egg. ?  cup of tofu.  Eat some foods each day that contain healthy fats, such as avocado, nuts, seeds, and fish. Lifestyle  Check your blood glucose regularly.  Exercise regularly as told by your health care provider. This may include: ? 150 minutes of moderate-intensity or vigorous-intensity exercise each week. This could be brisk walking, biking, or water aerobics. ? Stretching and doing strength exercises, such as yoga or weightlifting, at least 2 times a week.  Take medicines as told by your health care provider.  Do not use any products that contain nicotine or tobacco, such as cigarettes and e-cigarettes. If you need help quitting, ask your health care provider.  Work with a Social worker or diabetes educator to identify strategies to manage stress and any emotional and social challenges. Questions to ask a health care provider  Do I need to meet with a diabetes educator?  Do I need to meet with a dietitian?  What number can I call if I have  questions?  When are the best times to check my blood glucose? Where to find more information:  American Diabetes Association: diabetes.org  Academy of Nutrition and Dietetics: www.eatright.[employer] of Diabetes and Digestive and Kidney Diseases (NIH): [employer] Summary  A healthy meal plan will help you control your blood glucose and maintain a healthy lifestyle.  Working with a diet and nutrition specialist (dietitian) can help you make a meal plan that is best for you.  Keep in mind that carbohydrates (carbs) and alcohol have immediate effects on your blood glucose levels. It is important to count carbs and to use alcohol carefully. This information is not intended to replace advice given to you by your health care provider. Make sure you discuss any questions you have with your health care provider. Document Released: 08/27/2005 Document Revised: 11/12/2017 Document Reviewed: 01/04/2017 Elsevier Patient Education  2020 [employer].

## 2019-06-24 LAB — COMPLETE METABOLIC PANEL WITH GFR
AG Ratio: 1.3 (calc) (ref 1.0–2.5)
ALT: 11 U/L (ref 6–29)
AST: 12 U/L (ref 10–35)
Albumin: 3.9 g/dL (ref 3.6–5.1)
Alkaline phosphatase (APISO): 60 U/L (ref 31–125)
BUN: 16 mg/dL (ref 7–25)
CO2: 24 mmol/L (ref 20–32)
Calcium: 9.6 mg/dL (ref 8.6–10.2)
Chloride: 99 mmol/L (ref 98–110)
Creat: 0.79 mg/dL (ref 0.50–1.10)
GFR, Est African American: 102 mL/min/{1.73_m2} (ref >=60)
GFR, Est Non African American: 88 mL/min/{1.73_m2} (ref >=60)
Globulin: 3 g/dL (calc) (ref 1.9–3.7)
Glucose, Bld: 380 mg/dL — ABNORMAL HIGH (ref 65–99)
Potassium: 4.5 mmol/L (ref 3.5–5.3)
Sodium: 132 mmol/L — ABNORMAL LOW (ref 135–146)
Total Bilirubin: 1.4 mg/dL — ABNORMAL HIGH (ref 0.2–1.2)
Total Protein: 6.9 g/dL (ref 6.1–8.1)

## 2019-06-24 LAB — LIPID PANEL
Cholesterol: 223 mg/dL — ABNORMAL HIGH (ref <200)
HDL: 46 mg/dL — ABNORMAL LOW (ref >=50)
LDL Cholesterol (Calc): 152 mg/dL (calc) — ABNORMAL HIGH
Non-HDL Cholesterol (Calc): 177 mg/dL (calc) — ABNORMAL HIGH (ref <130)
Total CHOL/HDL Ratio: 4.8 (calc) (ref <5.0)
Triglycerides: 123 mg/dL (ref <150)

## 2019-06-24 LAB — HEMOGLOBIN A1C
Hgb A1c MFr Bld: 13 % of total Hgb — ABNORMAL HIGH (ref <5.7)
Mean Plasma Glucose: 326 (calc)
eAG (mmol/L): 18.1 (calc)

## 2019-06-24 LAB — MICROALBUMIN / CREATININE URINE RATIO
Creatinine, Urine: 110 mg/dL (ref 20–275)
Microalb Creat Ratio: 345 mcg/mg creat — ABNORMAL HIGH (ref <30)
Microalb, Ur: 38 mg/dL

## 2019-06-25 ENCOUNTER — Other Ambulatory Visit: Payer: Self-pay | Admitting: Family Medicine

## 2019-06-25 DIAGNOSIS — E782 Mixed hyperlipidemia: Secondary | ICD-10-CM

## 2019-06-25 DIAGNOSIS — E1129 Type 2 diabetes mellitus with other diabetic kidney complication: Secondary | ICD-10-CM

## 2019-06-25 MED ORDER — EMPAGLIFLOZIN 10 MG PO TABS: 10.0000 mg | ORAL_TABLET | Freq: Every day | ORAL | 1 refills | Status: DC

## 2019-06-25 MED ORDER — NOVOFINE 32G X 6 MM MISC: 1.0000 | 1 refills | Status: DC

## 2019-06-25 MED ORDER — ATORVASTATIN CALCIUM 20 MG PO TABS: 20.0000 mg | ORAL_TABLET | Freq: Every day | ORAL | 3 refills | Status: DC

## 2019-06-25 MED ORDER — OZEMPIC (0.25 OR 0.5 MG/DOSE) 2 MG/1.5ML ~~LOC~~ SOPN: PEN_INJECTOR | SUBCUTANEOUS | 3 refills | Status: DC

## 2019-06-25 NOTE — Progress Notes (Signed)
Please call to schedule 2 week follow up for this patient.

## 2019-07-04 ENCOUNTER — Encounter: Payer: Self-pay | Admitting: Family Medicine

## 2019-07-04 DIAGNOSIS — R809 Proteinuria, unspecified: Secondary | ICD-10-CM

## 2019-07-04 DIAGNOSIS — E1129 Type 2 diabetes mellitus with other diabetic kidney complication: Secondary | ICD-10-CM

## 2019-07-10 ENCOUNTER — Ambulatory Visit: Payer: Self-pay | Admitting: Pharmacist

## 2019-07-10 DIAGNOSIS — F419 Anxiety disorder, unspecified: Secondary | ICD-10-CM

## 2019-07-10 DIAGNOSIS — R030 Elevated blood-pressure reading, without diagnosis of hypertension: Secondary | ICD-10-CM

## 2019-07-10 NOTE — Chronic Care Management (AMB) (Signed)
  Care Management   Note  07/10/2019 Name: Sarah Duffy MRN: [id number] DOB: 1971-02-06  48 y.o. year old female referred to Care Management by Raelyn Ensign, NP for medication assistance. Last office visit with Raelyn Ensign, FNP was 06/25/19.   Was unable to reach patient via telephone today and have left HIPAA compliant voicemail asking patient to return my call. (unsuccessful outreach #1).  Follow up: Will outreach patient again in 5-7 days.  Ruben Reason, PharmD Clinical Pharmacist [facility] [phone]

## 2019-07-11 ENCOUNTER — Ambulatory Visit: Payer: Self-pay

## 2019-07-14 ENCOUNTER — Ambulatory Visit: Payer: Self-pay | Admitting: Pharmacist

## 2019-07-14 ENCOUNTER — Telehealth: Payer: Self-pay

## 2019-07-14 DIAGNOSIS — E1129 Type 2 diabetes mellitus with other diabetic kidney complication: Secondary | ICD-10-CM

## 2019-07-14 NOTE — Chronic Care Management (AMB) (Signed)
  Care Management   Note  07/14/2019 Name: Sarah Duffy MRN: [id number] DOB: 04-23-1971  48 y.o. year old female referred to Care Management by Raelyn Ensign, NP for medication assistance. Last office visit with Raelyn Ensign, FNP was 06/25/19.   Was unable to reach patient via telephone today and have left HIPAA compliant voicemail asking patient to return my call. (unsuccessful outreach #2).  Note: it does appear that patient has private insurance, she should be able to use manufacturers coupons   Follow up: Will outreach patient again in 5-7 days.  Ruben Reason, PharmD Clinical Pharmacist [facility] [phone]

## 2019-07-19 ENCOUNTER — Ambulatory Visit: Payer: Self-pay

## 2019-07-21 ENCOUNTER — Ambulatory Visit: Payer: Medicaid Other | Admitting: Pharmacist

## 2019-07-21 ENCOUNTER — Telehealth: Payer: Self-pay | Admitting: Family Medicine

## 2019-07-21 NOTE — Chronic Care Management (AMB) (Signed)
  Care Management   Note  07/21/2019 Name: Sarah Duffy MRN: [id number] DOB: [birth year]  48 y.o. year old female referred to Care Management team for medication assistance by Raelyn Ensign.   CCM team services are being closed due to three unsuccessful outreach attempts.   CCM pharmacist notes that patient appears to have commercial insurance plan and may be able to use manufacturers coupons to lower diabetes medication costs.   Patient has been provided CM contact information if he/she wishes to engage with care managers in the future.   Ruben Reason, PharmD Clinical Pharmacist [facility] [phone]

## 2019-07-21 NOTE — Telephone Encounter (Signed)
Copied from [place] [id number]. Topic: Quick Communication - Rx Refill/Question >> Jul 21, 2019  2:12 PM Erick Blinks wrote: Medication: empagliflozin (JARDIANCE) 10 MG TABS tablet  - Pt is seeking cheaper alternative. Priced too high at pharmacy   Has the patient contacted their pharmacy? Yes.   (Agent: If no, request that the patient contact the pharmacy for the refill.) (Agent: If yes, when and what did the pharmacy advise?)  Preferred Pharmacy (with phone number or street name): [facility] [place], [place] - [address] [place] [address] Phone: [phone] Fax: [phone]    Agent: Please be advised that RX refills may take up to 3 business days. We ask that you follow-up with your pharmacy.

## 2019-07-25 ENCOUNTER — Ambulatory Visit: Payer: Self-pay | Admitting: Pharmacist

## 2019-07-25 NOTE — Patient Instructions (Signed)
Sarah Duffy was given information about Care Management services today including:  1. Care Management services includes personalized support from designated clinical staff supervised by her physician, including individualized plan of care and coordination with other care providers 2. 24/7 contact phone numbers for assistance for urgent and routine care needs. 3. The patient may stop case management services at any time by phone call to the office staff.  Patient agreed to services and verbal consent obtained.

## 2019-07-25 NOTE — Telephone Encounter (Signed)
I am happy to follow with this patient for now as I have been seeing her over the last few months.  Please let me know if you need anything after her visit tomorrow.

## 2019-07-25 NOTE — Chronic Care Management (AMB) (Signed)
  Chronic Care Management   Note  07/25/2019 Name: Aishi Courts MRN: [id number] DOB: Feb 11, 1971  Missi Mcmackin is a pleasant 48 y.o. year old female patient referred to care management by Raelyn Ensign, FNP.   Raelyn Ensign asked the CCM pharmacist to consult patient for medication assistance. Ms.. Depasquale agreed that care management services would be helpful to them.   Plan: I have scheduled a call to [name] for 07/26/19 at 11 AM Patient agreed to services and verbal consent obtained.  Ms. Perella was given information about Care Management services today including:  1. Case Management services includes personalized support from designated clinical staff supervised by her physician, including individualized plan of care and coordination with other care providers 2. 24/7 contact phone numbers for assistance for urgent and routine care needs. 3. The patient may stop case management services at any time by phone call to the office staff.  Patient agreed to services and verbal consent obtained.    Ruben Reason, PharmD Clinical Pharmacist [facility] [phone]

## 2019-07-25 NOTE — Telephone Encounter (Signed)
Scheduled visit with Care Management pharmacist for 8/12 at 11 am to assess medication assistance needs   Ruben Reason, PharmD Clinical Pharmacist [facility] [phone]

## 2019-07-26 ENCOUNTER — Ambulatory Visit: Payer: Self-pay | Admitting: Pharmacist

## 2019-07-26 DIAGNOSIS — E782 Mixed hyperlipidemia: Secondary | ICD-10-CM

## 2019-07-26 DIAGNOSIS — E1129 Type 2 diabetes mellitus with other diabetic kidney complication: Secondary | ICD-10-CM

## 2019-07-26 DIAGNOSIS — R809 Proteinuria, unspecified: Secondary | ICD-10-CM

## 2019-07-26 NOTE — Chronic Care Management (AMB) (Signed)
  Care Management   Initial Note   07/26/2019 Name: Eydie Wormley MRN: [id number] DOB: Apr 23, 1971  Subjective Caralina Nop is a 48 y.o. year old female who is a primary care patient of Steele Sizer, MD. The CCM pharmacist was consulted for medication assistance by Raelyn Ensign, FNP. Telephone outreach today, HIPAA identifiers verified.  Assessment Patient has or had [employer] coverage through marketplace. Payments were deferred/suspended during [place] pandemic and patient isn't sure if she has to repay ALL deferred payments or just resume regular payments. There is some sort of issue with her Pharmacist, community paperwork.    High copays could be due to high deductible plan OR because she has lost BCBS coverage. IF high deductible: can provide coupons that will bring costs down by approx $150 each. Will contribute towards deductible but copay will still be high. Can work with Raquel Sarna to find generic alternatives. IF patient has lost coverage completely, can apply for manufacturer assistance program.   Goals Addressed            This Visit's Progress   . Medication affordability (pt-stated)       Current Barriers:  . financial  Pharmacist Clinical Goal(s): Over the next 14 days, Ms.Drue Flirt will investigate BCBS coverage and deductible in order to work with CCM pharmacist to lower costs of Jardiance and [place].   Interventions: . CCM pharmacist will apply for medication assistance program for Jardiance and Ozepmic via manufacturers if patient has lost insurance coverage . CCM pharmacist will provide patient with maunfacturer coupons for Jardiance and Ozempic if patient still has insurance coverage through [employer]   Patient Self Care Activities:  . Check coverage expiration by calling pharmacy or BCBS  Initial goal documentation         Telephone follow up appointment with care management team member scheduled for: Friday, August 14  Ruben Reason, PharmD Clinical  Pharmacist [facility] [phone]

## 2019-07-28 ENCOUNTER — Telehealth: Payer: Self-pay

## 2019-07-28 ENCOUNTER — Ambulatory Visit: Payer: Self-pay | Admitting: Pharmacist

## 2019-07-28 ENCOUNTER — Other Ambulatory Visit: Payer: Self-pay | Admitting: Family Medicine

## 2019-07-28 DIAGNOSIS — U071 COVID-19: Secondary | ICD-10-CM

## 2019-07-28 NOTE — Chronic Care Management (AMB) (Signed)
  Care Management   Note  07/28/2019 Name: Sarah Duffy MRN: [id number] DOB: [birth year]  48 y.o. year old female engaged with clinical pharmacist, following up today regarding the status of her insurance.   Was unable to reach patient via telephone today and have left HIPAA compliant voicemail asking patient to return my call. (unsuccessful outreach #1).  Follow up plan: A HIPPA compliant phone message was left for the patient providing contact information and requesting a return call.  The care management team will reach out to the patient again over the next 5-7 days.   Ruben Reason, PharmD Clinical Pharmacist [facility] [phone]

## 2019-07-29 ENCOUNTER — Other Ambulatory Visit: Payer: Self-pay | Admitting: Family Medicine

## 2019-07-29 DIAGNOSIS — I10 Essential (primary) hypertension: Secondary | ICD-10-CM

## 2019-07-29 DIAGNOSIS — G43009 Migraine without aura, not intractable, without status migrainosus: Secondary | ICD-10-CM

## 2019-08-01 ENCOUNTER — Telehealth: Payer: Self-pay

## 2019-08-01 ENCOUNTER — Ambulatory Visit: Payer: Self-pay | Admitting: Pharmacist

## 2019-08-01 NOTE — Chronic Care Management (AMB) (Signed)
  Care Management   Note  08/01/2019 Name: Sarah Duffy MRN: [id number] DOB: 1971/09/02  48 y.o. year old female engaged with clinical pharmacist, following up today regarding the status of her insurance.   Was unable to reach patient via telephone today and have left HIPAA compliant voicemail asking patient to return my call. (unsuccessful outreach #2).  Follow up plan: A HIPPA compliant phone message was left for the patient providing contact information and requesting a return call.  The care management team will reach out to the patient again over the next 5-7 days.   Ruben Reason, PharmD Clinical Pharmacist [facility] [phone]

## 2019-08-02 ENCOUNTER — Other Ambulatory Visit: Payer: Self-pay

## 2019-08-02 ENCOUNTER — Ambulatory Visit (INDEPENDENT_AMBULATORY_CARE_PROVIDER_SITE_OTHER): Payer: Medicaid Other | Admitting: Family Medicine

## 2019-08-02 ENCOUNTER — Encounter: Payer: Self-pay | Admitting: Family Medicine

## 2019-08-02 DIAGNOSIS — Z8616 Personal history of COVID-19: Secondary | ICD-10-CM

## 2019-08-02 DIAGNOSIS — Z8619 Personal history of other infectious and parasitic diseases: Secondary | ICD-10-CM

## 2019-08-02 DIAGNOSIS — R0602 Shortness of breath: Secondary | ICD-10-CM

## 2019-08-02 MED ORDER — BUDESONIDE-FORMOTEROL FUMARATE 160-4.5 MCG/ACT IN AERO: 2.0000 | INHALATION_SPRAY | Freq: Two times a day (BID) | RESPIRATORY_TRACT | 3 refills | Status: DC

## 2019-08-02 NOTE — Progress Notes (Signed)
Name: Sarah Duffy   MRN: [id number]    DOB: May 23, 1971   Date:08/02/2019       Progress Note  Subjective  Chief Complaint  Chief Complaint  Patient presents with   Follow-up    exposure to positive Covid from neice on Sunday   Fatigue    exhausted, sob, tired    I connected with  Sarah BrownsAngela Vanvoorhis  on 08/02/19 at 10:40 AM EDT by a video enabled telemedicine application and verified that I am speaking with the correct person using two identifiers.  I discussed the limitations of evaluation and management by telemedicine and the availability of in person appointments. The patient expressed understanding and agreed to proceed. Staff also discussed with the patient that there may be a patient responsible charge related to this service. Patient Location: Home Provider Location: Home Additional Individuals present: None  HPI  Pt presents with concern for fatigue, shortness of breath with exertion over the last few weeks.  She has had a few episodes of feeling like she is going to pass out.  She was sick with COVID-19 infection in late June/Early July 2020 and went back to work in the last several weeks; she did have Zpack completed during her illness.  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  She is using her albuterol inhaler PRN, but it does not seem to be helping much.  She does note that she is feeling a bit unbalanced when she gets short of breath.   She does note that her niece tested positive for COVID and she had close contact with her 07/30/2019 (and for several weeks prior to this). Her work is asking her to be tested for COVID, however she was positive in June 2020 and would likely still be positive from this initial illness.  Her current above symptoms started prior to her niece having symptoms or being exposed to her niece.  I did reach out to the [facility] for guidance for Ms. Dacanay's  recent exposure.  The Health Department states that she does not need to re-quarantine nor does she require re-testing.  She will need to call the HD for a note for work stating this.   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 associated with type 2 diabetes mellitus (HCC) 05/21/2016   Hyperlipidemia 03/10/2016   MI (mitral incompetence) 02/25/2016   Foot pain 02/25/2016   Insomnia 02/25/2016   Uncontrolled type 2 diabetes mellitus with foot ulcer (HCC) 02/03/2016   Anxiety 09/04/2014    Social History   Tobacco Use   Smoking status: Never Smoker   Smokeless tobacco: Never Used  Substance Use Topics   Alcohol use: No    Alcohol/week: 0.0 standard drinks     Current Outpatient Medications:    albuterol (VENTOLIN HFA) 108 (90 Base) MCG/ACT inhaler, INHALE 2 PUFFS INTO THE LUNGS EVERY 6 HOURS AS NEEDED FOR WHEEZING OR SHORTNESS OF BREATH, Disp: 18 g, Rfl: 1   aspirin EC 81 MG tablet, Take 1 tablet (81  mg total) by mouth daily., Disp: 30 tablet, Rfl: 0   atorvastatin (LIPITOR) 20 MG tablet, Take 1 tablet (20 mg total) by mouth daily., Disp: 90 tablet, Rfl: 3   Continuous Blood Gluc Receiver (FREESTYLE LIBRE 14 DAY READER) DEVI, 1 each by Does not apply route 4 (four) times daily., Disp: 2 Device, Rfl: 2   Continuous Blood Gluc Sensor (FREESTYLE LIBRE 14 DAY SENSOR) MISC, 1 each by Does not apply route 4 (four) times daily., Disp: 2 each, Rfl: 2   Insulin Pen Needle (NOVOFINE) 32G X 6  MM MISC, 1 each by Does not apply route once a week. Use with Ozempic, Disp: 100 each, Rfl: 1   medroxyPROGESTERone (DEPO-PROVERA) 150 MG/ML injection, Inject 150 mg into the muscle every 3 (three) months., Disp: , Rfl:    Multiple Vitamin (MULTIVITAMIN) capsule, Take by mouth., Disp: , Rfl:    olmesartan-hydrochlorothiazide (BENICAR HCT) 20-12.5 MG tablet, TAKE 1 TABLET BY MOUTH DAILY, Disp: 90 tablet, Rfl: 1   SUMAtriptan (IMITREX) 100 MG tablet, TAKE 1 TABLET BY MOUTH EVERY 2 HOURS AS NEEDED FOR MIGRAINE, MAY REPEAT IN 2 HOURS IF HEADACHE PERSISTS OR RECURS, Disp: 10 tablet, Rfl: 1   topiramate (TOPAMAX) 50 MG tablet, TAKE 1 TO 2 TABLETS(50 TO 100 MG) BY MOUTH AT BEDTIME. START AT 50 MG AND AFTER 1 WEEK TAKE 2 EVERY NIGHT AT BEDTIME, Disp: 60 tablet, Rfl: 0   diclofenac sodium (VOLTAREN) 1 % GEL, Apply 4 g topically 4 (four) times daily. (Patient not taking: Reported on 06/14/2019), Disp: 100 g, Rfl: 2   empagliflozin (JARDIANCE) 10 MG TABS tablet, Take 10 mg by mouth daily. (Patient not taking: Reported on 08/02/2019), Disp: 90 tablet, Rfl: 1   Semaglutide,0.25 or 0.5MG /DOS, (OZEMPIC, 0.25 OR 0.5 MG/DOSE,) 2 MG/1.5ML SOPN, Inject 0.25 mg into the skin once a week for 28 days, THEN 0.5 mg once a week. (Patient not taking: Reported on 08/02/2019), Disp: 3 pen, Rfl: 3  No Known Allergies  I personally reviewed active problem list, medication list, allergies, notes from last encounter, lab results with the patient/caregiver today.  ROS  Ten systems reviewed and is negative except as mentioned in HPI  Objective  Virtual encounter, vitals not obtained.  There is no height or weight on file to calculate BMI.  Nursing Note and Vital Signs reviewed.  Physical Exam  Constitutional: Patient appears well-developed and well-nourished. No distress.  HENT: Head: Normocephalic and atraumatic.  Neck: Normal range of motion. Pulmonary/Chest: Effort normal. No respiratory distress. Speaking in  complete sentences Neurological: Pt is alert and oriented to person, place, and time. Coordination, speech and gait are normal.  Psychiatric: Patient has a normal mood and affect. behavior is normal. Judgment and thought content normal.  No results found for this or any previous visit (from the past 72 hour(s)).  Assessment & Plan  1. History of 2019 novel coronavirus disease (COVID-19) - Suspect this is residual symptoms from COVID-19 infection and due to increased activity once returning to work.  Will obtain CXR and trial Symbicort for symptom control, however I did advise we may need to send to pulmonology for further evaluation, and she is agreeable to this if needed. - DG Chest 2 View; Future - budesonide-formoterol (SYMBICORT) 160-4.5 MCG/ACT inhaler; Inhale 2 puffs into the lungs 2 (two) times daily.  Dispense: 1 Inhaler; Refill: 3  2. Shortness of breath - DG Chest 2 View; Future - budesonide-formoterol (SYMBICORT) 160-4.5 MCG/ACT inhaler; Inhale 2 puffs into the lungs 2 (  two) times daily.  Dispense: 1 Inhaler; Refill: 3  Patient notified to call health dept about her recent exposure - they do not recommend re-testing (nor do I), and they do not recommend quarantining.  She will be advised to monitor for symptom changes and may continue to work.  -Red flags and when to present for emergency care or RTC including fever >101.46F, chest pain, shortness of breath, new/worsening/un-resolving symptoms,  reviewed with patient at time of visit. Follow up and care instructions discussed and provided in AVS. - I discussed the assessment and treatment plan with the patient. The patient was provided an opportunity to ask questions and all were answered. The patient agreed with the plan and demonstrated an understanding of the instructions.  I provided 22 minutes of non-face-to-face time during this encounter.  Doren CustardEmily E Labella Zahradnik, FNP

## 2019-08-04 ENCOUNTER — Telehealth: Payer: Self-pay

## 2019-08-04 ENCOUNTER — Ambulatory Visit: Payer: Self-pay | Admitting: Pharmacist

## 2019-08-04 NOTE — Chronic Care Management (AMB) (Signed)
  Care Management   Note  08/04/2019 Name: Zoee Heeney MRN: [id number] DOB: 04-Aug-1971  48 y.o. year old female referred to Care Management pharmacist for medication assistance by Raelyn Ensign.   CCM team services are being closed due to three unsuccessful outreach attempts. Left HIPAA complaint voicemail message  Patient has been provided CM contact information if he/she wishes to engage with care managers in the future.   Ruben Reason, PharmD Clinical Pharmacist [facility] [phone]

## 2019-09-14 ENCOUNTER — Ambulatory Visit: Payer: BLUE CROSS/BLUE SHIELD | Admitting: Registered"

## 2019-09-25 ENCOUNTER — Ambulatory Visit
Admission: RE | Admit: 2019-09-25 | Discharge: 2019-09-25 | Disposition: A | Discharge status: Home / Self Care | Payer: 59 | Source: Ambulatory Visit | Attending: Family Medicine | Admitting: Family Medicine

## 2019-09-25 ENCOUNTER — Encounter: Payer: Self-pay | Admitting: Family Medicine

## 2019-09-25 ENCOUNTER — Ambulatory Visit: Payer: Medicaid Other | Admitting: Family Medicine

## 2019-09-25 ENCOUNTER — Other Ambulatory Visit: Payer: Self-pay

## 2019-09-25 VITALS — BP 140/80 | HR 88 | Temp 97.3°F | Resp 16 | Ht 69.0 in | Wt 186.0 lb

## 2019-09-25 DIAGNOSIS — E782 Mixed hyperlipidemia: Secondary | ICD-10-CM

## 2019-09-25 DIAGNOSIS — R0602 Shortness of breath: Secondary | ICD-10-CM | POA: Insufficient documentation

## 2019-09-25 DIAGNOSIS — Z23 Encounter for immunization: Secondary | ICD-10-CM | POA: Diagnosis not present

## 2019-09-25 DIAGNOSIS — Z8619 Personal history of other infectious and parasitic diseases: Secondary | ICD-10-CM

## 2019-09-25 DIAGNOSIS — F330 Major depressive disorder, recurrent, mild: Secondary | ICD-10-CM | POA: Insufficient documentation

## 2019-09-25 DIAGNOSIS — Z8616 Personal history of COVID-19: Secondary | ICD-10-CM

## 2019-09-25 DIAGNOSIS — E1129 Type 2 diabetes mellitus with other diabetic kidney complication: Secondary | ICD-10-CM

## 2019-09-25 DIAGNOSIS — G43009 Migraine without aura, not intractable, without status migrainosus: Secondary | ICD-10-CM

## 2019-09-25 DIAGNOSIS — I10 Essential (primary) hypertension: Secondary | ICD-10-CM | POA: Diagnosis not present

## 2019-09-25 DIAGNOSIS — E113491 Type 2 diabetes mellitus with severe nonproliferative diabetic retinopathy without macular edema, right eye: Secondary | ICD-10-CM

## 2019-09-25 DIAGNOSIS — R809 Proteinuria, unspecified: Secondary | ICD-10-CM

## 2019-09-25 DIAGNOSIS — E1159 Type 2 diabetes mellitus with other circulatory complications: Secondary | ICD-10-CM

## 2019-09-25 DIAGNOSIS — I152 Hypertension secondary to endocrine disorders: Secondary | ICD-10-CM

## 2019-09-25 MED ORDER — TOPIRAMATE 100 MG PO TABS: 100.0000 mg | ORAL_TABLET | Freq: Two times a day (BID) | ORAL | 2 refills | Status: DC

## 2019-09-25 MED ORDER — ATORVASTATIN CALCIUM 40 MG PO TABS: 40.0000 mg | ORAL_TABLET | Freq: Every day | ORAL | 1 refills | Status: DC

## 2019-09-25 MED ORDER — OLMESARTAN MEDOXOMIL-HCTZ 20-12.5 MG PO TABS: 1.0000 | ORAL_TABLET | Freq: Every day | ORAL | 2 refills | Status: DC

## 2019-09-25 MED ORDER — DULOXETINE HCL 30 MG PO CPEP: 30.0000 mg | ORAL_CAPSULE | Freq: Every day | ORAL | 0 refills | Status: DC

## 2019-09-25 MED ORDER — EMPAGLIFLOZIN 10 MG PO TABS: 10.0000 mg | ORAL_TABLET | Freq: Every day | ORAL | 2 refills | Status: DC

## 2019-09-25 MED ORDER — BUDESONIDE-FORMOTEROL FUMARATE 160-4.5 MCG/ACT IN AERO: 2.0000 | INHALATION_SPRAY | Freq: Two times a day (BID) | RESPIRATORY_TRACT | 2 refills | Status: DC

## 2019-09-25 MED ORDER — FREESTYLE LIBRE 14 DAY SENSOR MISC: 1.0000 | Freq: Four times a day (QID) | 2 refills | Status: DC

## 2019-09-25 MED ORDER — TRESIBA FLEXTOUCH 100 UNIT/ML ~~LOC~~ SOPN: 16.0000 [IU] | PEN_INJECTOR | Freq: Every day | SUBCUTANEOUS | 0 refills | Status: DC

## 2019-09-25 MED ORDER — SUMATRIPTAN SUCCINATE 100 MG PO TABS: 100.0000 mg | ORAL_TABLET | ORAL | 1 refills | Status: DC | PRN

## 2019-09-25 MED ORDER — OZEMPIC (0.25 OR 0.5 MG/DOSE) 2 MG/1.5ML ~~LOC~~ SOPN: 0.5000 mg | PEN_INJECTOR | SUBCUTANEOUS | 2 refills | Status: AC

## 2019-09-25 NOTE — Patient Instructions (Signed)
Titrate Tresiba by 2 units every 3 days to keep fasting between 100-140

## 2019-09-25 NOTE — Progress Notes (Signed)
Name: Sarah Duffy   MRN: [id number]    DOB: Feb 19, 1971   Date:09/25/2019       Progress Note  Subjective  Chief Complaint  Chief Complaint  Patient presents with  . Diabetes  . Hypertension  . Hyperlipidemia  . Anxiety    HPI  History of COVID-19: [facility] in June [facility], and [facility] by [facility] She states for weeks noticed palpitation, sob with activity and fatigue, now she still has leg cramps, joint stiffness, she has a mild dry cough that is not going away, also some wheezing, SOB with activity has improved but still present. She will resume Symbicort, and we will check CXR   MDD: she states since COVID-19 she feels weird, afraid of getting it again, she has a long history of recurrent depression, lots of struggles in her life, she would like to resume medication for depression   DMII: history of left foot ulcer, also has microalbuminuria, hypertension and dyslipidemia. Currently on Atorvastatin 20 mg but LDL not at goal, bp is slightly elevated but she is not on Jardiance at this time. We will recheck labs. She has not been able to afford Ozempic, Jardiance, but now has Medicaid. We will also change pharmacy. Glucose at home has elevated, she states the free style Josephine Igolibre ran out , explained importance of resuming a basal insulin, explained how to titrate medication and follow up in one month   Patient Active Problem List   Diagnosis Date Noted  . History of 2019 novel coronavirus disease (COVID-19) 06/23/2019  . Elevated blood pressure reading without diagnosis of hypertension 03/03/2018  . Migraine headache without aura 01/14/2018  . 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(HCC) 05/22/2017  . Diabetic retinopathy of left eye, with macular edema, with severe nonproliferative retinopathy, associated with type 2 diabetes mellitus 05/21/2016  . 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 Hyperlipidemia 03/10/2016  . MI (mitral incompetence) 02/25/2016  . Foot pain 02/25/2016  . Insomnia 02/25/2016  . Uncontrolled type 2 diabetes mellitus with foot ulcer (HCC) 02/03/2016  . Anxiety 09/04/2014    Past Surgical History:  Procedure Laterality Date  . CHOLECYSTECTOMY  1999  . TOE SURGERY Left 02/07/2016   Pinky Toe    Family History  Problem Relation Age of Onset  . Diabetes Mother   . Ulcers Mother   . Heart disease Father   . AAA (abdominal aortic aneurysm) Father   . Diabetes Father   . Hypertension Father   . Stroke Father   . Alzheimer's disease Father   . Heart attack Sister   . Seizures Brother   . Diabetes Maternal Grandmother   . Breast cancer Maternal Grandmother     Social History   Socioeconomic History  . Marital status: Single    Spouse name: Not on file  . Number of children: 1  . Years of education: Not on file  . Highest education level: Some college, no degree  Occupational History  . Occupation: Dealerhosting and server   Social Needs  . Financial resource strain: Not on file  . Food insecurity    Worry: Not on file    Inability: Not on file  . Transportation needs    Medical: Not on file    Non-medical: Not on file  Tobacco Use  . Smoking status: Never Smoker  . Smokeless tobacco: Never Used  Substance and Sexual Activity  . Alcohol use:  No    Alcohol/week: 0.0 standard drinks  . Drug use: No  . Sexual activity: Yes    Partners: Male    Birth control/protection: Injection  Lifestyle  . Physical activity    Days per week: Not on file    Minutes per session: Not on file  . Stress: Not on file  Relationships  . Social Herbalist on phone: Not on file    Gets together: Not on file    Attends religious service: Not on file    Active member of club or organization: Not on file    Attends meetings of clubs or organizations: Not on file    Relationship status: Not on file  . Intimate  partner violence    Fear of current or ex partner: Not on file    Emotionally abused: Not on file    Physically abused: Not on file    Forced sexual activity: Not on file  Other Topics Concern  . Not on file  Social History Narrative   She used to work at [employer], but  Feb 2017 she left work because of  MRSA infection.osteomyelitis and uncontrolled DM. She has been back to work since Feb 2018   Lives alone     Current Outpatient Medications:  .  albuterol (VENTOLIN HFA) 108 (90 Base) MCG/ACT inhaler, INHALE 2 PUFFS INTO THE LUNGS EVERY 6 HOURS AS NEEDED FOR WHEEZING OR SHORTNESS OF BREATH, Disp: 18 g, Rfl: 1 .  aspirin EC 81 MG tablet, Take 1 tablet (81 mg total) by mouth daily., Disp: 30 tablet, Rfl: 0 .  atorvastatin (LIPITOR) 20 MG tablet, Take 1 tablet (20 mg total) by mouth daily., Disp: 90 tablet, Rfl: 3 .  budesonide-formoterol (SYMBICORT) 160-4.5 MCG/ACT inhaler, Inhale 2 puffs into the lungs 2 (two) times daily., Disp: 1 Inhaler, Rfl: 3 .  Continuous Blood Gluc Receiver (FREESTYLE LIBRE 14 DAY READER) DEVI, 1 each by Does not apply route 4 (four) times daily., Disp: 2 Device, Rfl: 2 .  Continuous Blood Gluc Sensor (FREESTYLE LIBRE 14 DAY SENSOR) MISC, 1 each by Does not apply route 4 (four) times daily., Disp: 2 each, Rfl: 2 .  Insulin Pen Needle (NOVOFINE) 32G X 6 MM MISC, 1 each by Does not apply route once a week. Use with Ozempic, Disp: 100 each, Rfl: 1 .  medroxyPROGESTERone (DEPO-PROVERA) 150 MG/ML injection, Inject 150 mg into the muscle every 3 (three) months., Disp: , Rfl:  .  Multiple Vitamin (MULTIVITAMIN) capsule, Take by mouth., Disp: , Rfl:  .  olmesartan-hydrochlorothiazide (BENICAR HCT) 20-12.5 MG tablet, TAKE 1 TABLET BY MOUTH DAILY, Disp: 90 tablet, Rfl: 1 .  SUMAtriptan (IMITREX) 100 MG tablet, TAKE 1 TABLET BY MOUTH EVERY 2 HOURS AS NEEDED FOR MIGRAINE, MAY REPEAT IN 2 HOURS IF HEADACHE PERSISTS OR RECURS, Disp: 10 tablet, Rfl: 1 .  topiramate (TOPAMAX) 50  MG tablet, TAKE 1 TO 2 TABLETS(50 TO 100 MG) BY MOUTH AT BEDTIME. START AT 50 MG AND AFTER 1 WEEK TAKE 2 EVERY NIGHT AT BEDTIME, Disp: 60 tablet, Rfl: 0 .  diclofenac sodium (VOLTAREN) 1 % GEL, Apply 4 g topically 4 (four) times daily. (Patient not taking: Reported on 09/25/2019), Disp: 100 g, Rfl: 2 .  empagliflozin (JARDIANCE) 10 MG TABS tablet, Take 10 mg by mouth daily. (Patient not taking: Reported on 09/25/2019), Disp: 90 tablet, Rfl: 1 .  Semaglutide,0.25 or 0.5MG /DOS, (OZEMPIC, 0.25 OR 0.5 MG/DOSE,) 2 MG/1.5ML SOPN, Inject 0.25 mg into  the skin once a week for 28 days, THEN 0.5 mg once a week. (Patient not taking: Reported on 09/25/2019), Disp: 3 pen, Rfl: 3  No Known Allergies  I personally reviewed active problem list, medication list, allergies, family history, social history, health maintenance with the patient/caregiver today.   ROS  Constitutional: Negative for fever or weight change.  Respiratory: Positive  for cough and shortness of breath.   Cardiovascular: Negative for chest pain or palpitations.  Gastrointestinal: Negative for abdominal pain, no bowel changes.  Musculoskeletal: Negative for gait problem or joint swelling.  Skin: Negative for rash.  Neurological: Negative for dizziness , positive for intermittent headache.  No other specific complaints in a complete review of systems (except as listed in HPI above).  Objective  Vitals:   09/25/19 0904  BP: 140/80  Pulse: 88  Resp: 16  Temp: (!) 97.3 F (36.3 C)  TempSrc: Temporal  SpO2: 99%  Weight: 186 lb (84.4 kg)  Height: 5\' 9"  (1.753 m)    Body mass index is 27.47 kg/m.  Physical Exam  Constitutional: Patient appears well-developed and well-nourished. Overweight. No distress.  HEENT: head atraumatic, normocephalic, pupils equal and reactive to light Cardiovascular: Normal rate, regular rhythm and normal heart sounds.  No murmur heard. No BLE edema. Pulmonary/Chest: Effort normal and breath sounds  normal. No respiratory distress. Abdominal: Soft.  There is no tenderness. Psychiatric: Patient has a normal mood and affect. behavior is normal. Judgment and thought content normal.  Diabetic Foot Exam: Diabetic Foot Exam - Simple   Simple Foot Form Diabetic Foot exam was performed with the following findings: Yes 09/25/2019  9:10 AM  Visual Inspection See comments: Yes Sensation Testing See comments: Yes Pulse Check Posterior Tibialis and Dorsalis pulse intact bilaterally: Yes Comments Bunion on both feet, failed monofilament test      PHQ2/9: Depression screen [facility] 2/9 09/25/2019 08/02/2019 06/23/2019 06/21/2019 06/14/2019  Decreased Interest 1 0 0 0 1  Down, Depressed, Hopeless 0 0 0 0 1  PHQ - 2 Score 1 0 0 0 2  Altered sleeping 3 0 0 0 1  Tired, decreased energy 2 1 0 0 1  Change in appetite 0 0 0 0 1  Feeling bad or failure about yourself  0 0 0 0 1  Trouble concentrating 0 0 0 0 1  Moving slowly or fidgety/restless 0 0 0 0 0  Suicidal thoughts 0 0 0 0 0  PHQ-9 Score 6 1 0 0 7  Difficult doing work/chores Not difficult at all Not difficult at all Not difficult at all Not difficult at all Not difficult at all    phq 9 is positive, she states mostly because recovering from COVID-19    Fall Risk: Fall Risk  09/25/2019 08/02/2019 06/23/2019 06/21/2019 06/14/2019  Falls in the past year? 0 0 0 0 0  Number falls in past yr: 0 0 0 0 0  Injury with Fall? 0 0 0 0 0  Follow up - Falls evaluation completed Falls evaluation completed Falls evaluation completed Falls evaluation completed    Functional Status Survey: Is the patient deaf or have difficulty hearing?: No Does the patient have difficulty seeing, even when wearing glasses/contacts?: No Does the patient have difficulty concentrating, remembering, or making decisions?: No Does the patient have difficulty walking or climbing stairs?: No Does the patient have difficulty dressing or bathing?: No Does the patient have difficulty  doing errands alone such as visiting a doctor's office or shopping?: No  Assessment & Plan  1. 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(HCC)  - COMPLETE METABOLIC PANEL WITH GFR - Hemoglobin A1c - atorvastatin (LIPITOR) 40 MG tablet; Take 1 tablet (40 mg total) by mouth daily.  Dispense: 90 tablet; Refill: 1 - empagliflozin (JARDIANCE) 10 MG TABS tablet; Take 10 mg by mouth daily.  Dispense: 30 tablet; Refill: 2 - Semaglutide,0.25 or 0.5MG /DOS, (OZEMPIC, 0.25 OR 0.5 MG/DOSE,) 2 MG/1.5ML SOPN; Inject 0.5 mg into the skin once a week for 28 days.  Dispense: 2 pen; Refill: 2 - Continuous Blood Gluc Sensor (FREESTYLE LIBRE 14 DAY SENSOR) MISC; 1 each by Does not apply route 4 (four) times daily.  Dispense: 2 each; Refill: 2  2. Need for immunization against influenza  - Flu Vaccine QUAD 36+ mos IM  3. Hypertension uncontrolled   - olmesartan-hydrochlorothiazide (BENICAR HCT) 20-12.5 MG tablet; Take 1 tablet by mouth daily.  Dispense: 30 tablet; Refill: 2  4. 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  Dispense: 2 each; Refill: 2  5. Hypertension associated with diabetes (HCC)  - Microalbumin / creatinine urine ratio  6. Mixed hyperlipidemia  - atorvastatin (LIPITOR) 40 MG tablet; Take 1 tablet (40 mg total) by mouth daily.  Dispense: 90 tablet; Refill: 1  7. Migraine without aura and without status migrainosus, not intractable  - SUMAtriptan (IMITREX) 100 MG tablet; Take 1 tablet (100 mg total) by mouth every 2 (two) hours as needed for migraine. Max of 2 in 24 hours  Dispense: 10 tablet; Refill: 1 - topiramate (TOPAMAX) 100 MG tablet; Take 1 tablet (100 mg total) by mouth 2 (two) times daily.  Dispense: 60 tablet; Refill: 2  8. Depression, major, recurrent, mild (HCC)  -  DULoxetine (CYMBALTA) 30 MG capsule; Take 1-2 capsules (30-60 mg total) by mouth daily. 30 first week after that 2 daily  Dispense: 60 capsule; Refill: 0  9. History of 2019 novel coronavirus disease (COVID-19)  - budesonide-formoterol (SYMBICORT) 160-4.5 MCG/ACT inhaler; Inhale 2 puffs into the lungs 2 (two) times daily.  Dispense: 1 Inhaler; Refill: 2 - DG Chest 2 View; Future  10. Shortness of breath  - budesonide-formoterol (SYMBICORT) 160-4.5 MCG/ACT inhaler; Inhale 2 puffs into the lungs 2 (two) times daily.  Dispense: 1 Inhaler; Refill: 2 - DG Chest 2 View; Future  11. Need for hepatitis B vaccination  - Heplisav-B (HepB-CPG) Vaccine

## 2019-09-26 ENCOUNTER — Other Ambulatory Visit: Payer: Self-pay | Admitting: Family Medicine

## 2019-09-26 DIAGNOSIS — IMO0002 Reserved for concepts with insufficient information to code with codable children: Secondary | ICD-10-CM

## 2019-09-26 DIAGNOSIS — E1140 Type 2 diabetes mellitus with diabetic neuropathy, unspecified: Secondary | ICD-10-CM

## 2019-09-26 LAB — COMPLETE METABOLIC PANEL WITH GFR
AG Ratio: 1.3 (calc) (ref 1.0–2.5)
ALT: 13 U/L (ref 6–29)
AST: 16 U/L (ref 10–35)
Albumin: 3.7 g/dL (ref 3.6–5.1)
Alkaline phosphatase (APISO): 66 U/L (ref 31–125)
BUN: 18 mg/dL (ref 7–25)
CO2: 25 mmol/L (ref 20–32)
Calcium: 8.8 mg/dL (ref 8.6–10.2)
Chloride: 105 mmol/L (ref 98–110)
Creat: 0.77 mg/dL (ref 0.50–1.10)
GFR, Est African American: 105 mL/min/{1.73_m2} (ref >=60)
GFR, Est Non African American: 91 mL/min/{1.73_m2} (ref >=60)
Globulin: 2.8 g/dL (calc) (ref 1.9–3.7)
Glucose, Bld: 287 mg/dL — ABNORMAL HIGH (ref 65–99)
Potassium: 4.1 mmol/L (ref 3.5–5.3)
Sodium: 136 mmol/L (ref 135–146)
Total Bilirubin: 0.5 mg/dL (ref 0.2–1.2)
Total Protein: 6.5 g/dL (ref 6.1–8.1)

## 2019-09-26 LAB — MICROALBUMIN / CREATININE URINE RATIO
Creatinine, Urine: 89 mg/dL (ref 20–275)
Microalb Creat Ratio: 467 mcg/mg creat — ABNORMAL HIGH (ref <30)
Microalb, Ur: 41.6 mg/dL

## 2019-09-26 LAB — HEMOGLOBIN A1C
Hgb A1c MFr Bld: 12.7 % of total Hgb — ABNORMAL HIGH (ref <5.7)
Mean Plasma Glucose: 318 (calc)
eAG (mmol/L): 17.6 (calc)

## 2019-10-02 ENCOUNTER — Ambulatory Visit (LOCAL_COMMUNITY_HEALTH_CENTER): Payer: 59

## 2019-10-02 ENCOUNTER — Other Ambulatory Visit: Payer: Self-pay

## 2019-10-02 VITALS — BP 155/83 | Ht 68.0 in | Wt 183.5 lb

## 2019-10-02 DIAGNOSIS — Z3009 Encounter for other general counseling and advice on contraception: Secondary | ICD-10-CM

## 2019-10-02 DIAGNOSIS — Z30013 Encounter for initial prescription of injectable contraceptive: Secondary | ICD-10-CM | POA: Diagnosis not present

## 2019-10-02 MED ORDER — MULTI-VITAMIN/MINERALS PO TABS: 1.0000 | ORAL_TABLET | Freq: Every day | ORAL | 0 refills | Status: DC

## 2019-10-02 MED ORDER — MEDROXYPROGESTERONE ACETATE 150 MG/ML IM SUSP
150.0000 mg | Freq: Once | INTRAMUSCULAR | Status: AC
  Administered 2019-10-02: 150 mg via INTRAMUSCULAR

## 2019-10-02 NOTE — Progress Notes (Signed)
Per client, not taking any medicines regularly other than daily MVI. Client states has not yet picked up medicines ordered by her MD  at visit 09/25/2019 (due to cost). Client with physical at [facility] in 03/2019 with Depo at that time. No additional Depo since 03/2019. Consult with Antoine Primas PA today as  BP = 155/83 and client 6 months past physical due date. Per Ms. Hampton: 1) Depo 150 mg IM x1 today, 2) recommend client notify PCP of BP reading today, 3) to schedule physical for 12/2019 and if agency not yet doing physicals, still needs to schedule appt with provider to update hx, etc.and 4) provide information regarding [facility] (provided written phone # and address). Client verbalized understanding of above. Tolerated Depo injection without complaint. Rich Number, RN

## 2019-10-26 ENCOUNTER — Ambulatory Visit (INDEPENDENT_AMBULATORY_CARE_PROVIDER_SITE_OTHER): Payer: 59 | Admitting: Family Medicine

## 2019-10-26 ENCOUNTER — Encounter: Payer: Self-pay | Admitting: Family Medicine

## 2019-10-26 ENCOUNTER — Other Ambulatory Visit: Payer: Self-pay

## 2019-10-26 VITALS — BP 120/88 | Temp 97.9°F | Resp 14 | Ht 68.0 in | Wt 184.8 lb

## 2019-10-26 DIAGNOSIS — Z23 Encounter for immunization: Secondary | ICD-10-CM | POA: Diagnosis not present

## 2019-10-26 DIAGNOSIS — F330 Major depressive disorder, recurrent, mild: Secondary | ICD-10-CM

## 2019-10-26 MED ORDER — ARIPIPRAZOLE 2 MG PO TABS: 2.0000 mg | ORAL_TABLET | Freq: Every evening | ORAL | 0 refills | Status: DC

## 2019-10-26 MED ORDER — DULOXETINE HCL 60 MG PO CPEP: 60.0000 mg | ORAL_CAPSULE | Freq: Every day | ORAL | 0 refills | Status: DC

## 2019-10-26 NOTE — Progress Notes (Signed)
Name: Sarah Duffy   MRN: [id number]    DOB: 1971/01/30   Date:10/26/2019       Progress Note  Subjective  Chief Complaint  Chief Complaint  Patient presents with  . Follow-up    1 month F/U  . Diabetes    HPI  MDD: she has a long history of depression, and since COVID-19 symptoms are getting worse. She states over the last month she has noticed lack of energy, feeling down and hopeless, difficulty concentrating, starting to affect her job. Feels overwhelmed at home, she states everyone needs her help. Carrying for her mother right now - she has COPD and has been admitted multiple times since last year.  She lost one brother by suicide in 2012 and is still grieving his loss, discussed hospice counseling and we will add Abilify to current regiment of cymbalta.   DMII: last A1C still above 12 she was given referral to see Endo but did not get a call, shared the number from [facility] with patient and she will contact them today. FSBS at home has been in the 300 range.    Patient Active Problem List   Diagnosis Date Noted  . Depression, major, recurrent, mild (HCC) 09/25/2019  . History of 2019 novel coronavirus disease (COVID-19) 06/23/2019  . Elevated blood pressure reading without diagnosis of hypertension 03/03/2018  . Migraine headache without aura 01/14/2018  . 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(HCC) 05/22/2017  . Diabetic retinopathy of left eye, with macular edema, with severe nonproliferative retinopathy, associated with type 2 diabetes mellitus 05/21/2016  . 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 Hyperlipidemia 03/10/2016  . MI (mitral incompetence) 02/25/2016  . Insomnia 02/25/2016  . Uncontrolled type 2 diabetes mellitus with foot ulcer (HCC) 02/03/2016  . Anxiety 09/04/2014    Past Surgical History:  Procedure Laterality Date  . CHOLECYSTECTOMY  1999  .  TOE SURGERY Left 02/07/2016   Pinky Toe    Family History  Problem Relation Age of Onset  . Diabetes Mother   . Ulcers Mother   . Heart disease Father   . AAA (abdominal aortic aneurysm) Father   . Diabetes Father   . Hypertension Father   . Stroke Father   . Alzheimer's disease Father   . Heart attack Sister   . Seizures Brother   . Diabetes Maternal Grandmother   . Breast cancer Maternal Grandmother     Social History   Socioeconomic History  . Marital status: Single    Spouse name: Not on file  . Number of children: 1  . Years of education: Not on file  . Highest education level: Some college, no degree  Occupational History  . Occupation: Dealer   Social Needs  . Financial resource strain: Not on file  . Food insecurity    Worry: Not on file    Inability: Not on file  . Transportation needs    Medical: Not on file    Non-medical: Not on file  Tobacco Use  . Smoking status: Never Smoker  . Smokeless tobacco: Never Used  Substance and Sexual Activity  . Alcohol use: No    Alcohol/week: 0.0 standard drinks  . Drug use: No  . Sexual activity: Yes    Partners: Male    Birth control/protection: Injection  Lifestyle  . Physical activity    Days per week: Not on file    Minutes per session:  Not on file  . Stress: Not on file  Relationships  . Social Herbalist on phone: Not on file    Gets together: Not on file    Attends religious service: Not on file    Active member of club or organization: Not on file    Attends meetings of clubs or organizations: Not on file    Relationship status: Not on file  . Intimate partner violence    Fear of current or ex partner: Not on file    Emotionally abused: Not on file    Physically abused: Not on file    Forced sexual activity: Not on file  Other Topics Concern  . Not on file  Social History Narrative   She used to work at [employer], but  Feb 2017 she left work because of  MRSA  infection.osteomyelitis and uncontrolled DM. She has been back to work since Feb 2018   Lives alone     Current Outpatient Medications:  .  albuterol (VENTOLIN HFA) 108 (90 Base) MCG/ACT inhaler, INHALE 2 PUFFS INTO THE LUNGS EVERY 6 HOURS AS NEEDED FOR WHEEZING OR SHORTNESS OF BREATH, Disp: 18 g, Rfl: 1 .  aspirin EC 81 MG tablet, Take 1 tablet (81 mg total) by mouth daily., Disp: 30 tablet, Rfl: 0 .  atorvastatin (LIPITOR) 40 MG tablet, Take 1 tablet (40 mg total) by mouth daily., Disp: 90 tablet, Rfl: 1 .  budesonide-formoterol (SYMBICORT) 160-4.5 MCG/ACT inhaler, Inhale 2 puffs into the lungs 2 (two) times daily., Disp: 1 Inhaler, Rfl: 2 .  Continuous Blood Gluc Receiver (FREESTYLE LIBRE 14 DAY READER) DEVI, 1 each by Does not apply route 4 (four) times daily., Disp: 2 Device, Rfl: 2 .  Continuous Blood Gluc Sensor (FREESTYLE LIBRE 14 DAY SENSOR) MISC, 1 each by Does not apply route 4 (four) times daily., Disp: 2 each, Rfl: 2 .  DULoxetine (CYMBALTA) 30 MG capsule, Take 1-2 capsules (30-60 mg total) by mouth daily. 30 first week after that 2 daily, Disp: 60 capsule, Rfl: 0 .  empagliflozin (JARDIANCE) 10 MG TABS tablet, Take 10 mg by mouth daily., Disp: 30 tablet, Rfl: 2 .  insulin degludec (TRESIBA FLEXTOUCH) 100 UNIT/ML SOPN FlexTouch Pen, Inject 0.16-0.5 mLs (16-50 Units total) into the skin daily., Disp: 15 mL, Rfl: 0 .  Insulin Pen Needle (NOVOFINE) 32G X 6 MM MISC, 1 each by Does not apply route once a week. Use with Ozempic, Disp: 100 each, Rfl: 1 .  Multiple Vitamin (MULTIVITAMIN) capsule, Take by mouth., Disp: , Rfl:  .  Multiple Vitamins-Minerals (MULTIVITAMIN WITH MINERALS) tablet, Take 1 tablet by mouth daily., Disp: 100 tablet, Rfl: 0 .  olmesartan-hydrochlorothiazide (BENICAR HCT) 20-12.5 MG tablet, Take 1 tablet by mouth daily., Disp: 30 tablet, Rfl: 2 .  SUMAtriptan (IMITREX) 100 MG tablet, Take 1 tablet (100 mg total) by mouth every 2 (two) hours as needed for migraine. Max of  2 in 24 hours, Disp: 10 tablet, Rfl: 1 .  topiramate (TOPAMAX) 100 MG tablet, Take 1 tablet (100 mg total) by mouth 2 (two) times daily., Disp: 60 tablet, Rfl: 2  No Known Allergies  I personally reviewed active problem list, medication list, allergies, family history, social history, health maintenance with the patient/caregiver today.   ROS  Constitutional: Negative for fever or weight change.  Respiratory: Negative for cough and shortness of breath.   Cardiovascular: Negative for chest pain or palpitations.  Gastrointestinal: Negative for abdominal pain, no bowel changes.  Musculoskeletal: Negative for gait problem or joint swelling.  Skin: Negative for rash.  Neurological: Negative for dizziness or headache.  No other specific complaints in a complete review of systems (except as listed in HPI above).  Objective  Vitals:   10/26/19 0916  BP: 120/88  Resp: 14  Temp: 97.9 F (36.6 C)  SpO2: 99%  Weight: 184 lb 12.8 oz (83.8 kg)  Height: 5\' 8"  (1.727 m)    Body mass index is 28.1 kg/m.  Physical Exam  Constitutional: Patient appears well-developed and well-nourished. Overweight.  No distress.  HEENT: head atraumatic, normocephalic, pupils equal and reactive to light Cardiovascular: Normal rate, regular rhythm and normal heart sounds.  No murmur heard. No BLE edema. Pulmonary/Chest: Effort normal and breath sounds normal. No respiratory distress. Abdominal: Soft.  There is no tenderness. Psychiatric: Patient has a normal mood and affect. behavior is normal. Judgment and thought content normal.  Recent Results (from the past 2160 hour(s))  COMPLETE METABOLIC PANEL WITH GFR     Status: Abnormal   Collection Time: 09/25/19 12:00 AM  Result Value Ref Range   Glucose, Bld 287 (H) 65 - 99 mg/dL    Comment: .            Fasting reference interval . For someone without known diabetes, a glucose value >125 mg/dL indicates that they may have diabetes and this should be  confirmed with a follow-up test. .    BUN 18 7 - 25 mg/dL   Creat 1.610.77 0.960.50 - 0.451.10 mg/dL   GFR, Est Non African American 91 > OR = 60 mL/min/1.2373m2   GFR, Est African American 105 > OR = 60 mL/min/1.7373m2   BUN/Creatinine Ratio NOT APPLICABLE 6 - 22 (calc)   Sodium 136 135 - 146 mmol/L   Potassium 4.1 3.5 - 5.3 mmol/L   Chloride 105 98 - 110 mmol/L   CO2 25 20 - 32 mmol/L   Calcium 8.8 8.6 - 10.2 mg/dL   Total Protein 6.5 6.1 - 8.1 g/dL   Albumin 3.7 3.6 - 5.1 g/dL   Globulin 2.8 1.9 - 3.7 g/dL (calc)   AG Ratio 1.3 1.0 - 2.5 (calc)   Total Bilirubin 0.5 0.2 - 1.2 mg/dL   Alkaline phosphatase (APISO) 66 31 - 125 U/L   AST 16 10 - 35 U/L   ALT 13 6 - 29 U/L  Hemoglobin A1c     Status: Abnormal   Collection Time: 09/25/19 12:00 AM  Result Value Ref Range   Hgb A1c MFr Bld 12.7 (H) <5.7 % of total Hgb    Comment: For someone without known diabetes, a hemoglobin A1c value of 6.5% or greater indicates that they may have  diabetes and this should be confirmed with a follow-up  test. . For someone with known diabetes, a value <7% indicates  that their diabetes is well controlled and a value  greater than or equal to 7% indicates suboptimal  control. A1c targets should be individualized based on  duration of diabetes, age, comorbid conditions, and  other considerations. . Currently, no consensus exists regarding use of hemoglobin A1c for diagnosis of diabetes for children. .    Mean Plasma Glucose 318 (calc)   eAG (mmol/L) 17.6 (calc)  Microalbumin / creatinine urine ratio     Status: Abnormal   Collection Time: 09/25/19 12:00 AM  Result Value Ref Range   Creatinine, Urine 89 20 - 275 mg/dL   Microalb, Ur 40.941.6 mg/dL    Comment: Verified  by repeat analysis. Marland Kitchen Reference Range Not established    Microalb Creat Ratio 467 (H) <30 mcg/mg creat    Comment: . The ADA defines abnormalities in albumin excretion as follows: Marland Kitchen Category         Result (mcg/mg  creatinine) . Normal                    <30 Microalbuminuria         30-299  Clinical albuminuria   > OR = 300 . The ADA recommends that at least two of three specimens collected within a 3-6 month period be abnormal before considering a patient to be within a diagnostic category.      PHQ2/9: Depression screen [facility] 2/9 10/26/2019 09/25/2019 08/02/2019 06/23/2019 06/21/2019  Decreased Interest 1 1 0 0 0  Down, Depressed, Hopeless 1 0 0 0 0  PHQ - 2 Score 2 1 0 0 0  Altered sleeping 0 3 0 0 0  Tired, decreased energy 0 0  Change in appetite 0 0 0 0 0  Feeling bad or failure about yourself  1 0 0 0 0  Trouble concentrating 1 0 0 0 0  Moving slowly or fidgety/restless 1 0 0 0 0  Suicidal thoughts 0 0 0 0 0  PHQ-9 Score 0 0  Difficult doing work/chores Somewhat difficult Not difficult at all Not difficult at all Not difficult at all Not difficult at all    phq 9 is positive   Fall Risk: Fall Risk  09/25/2019 08/02/2019 06/23/2019 06/21/2019 06/14/2019  Falls in the past year? 0 0 0 0 0  Number falls in past yr: 0 0 0 0 0  Injury with Fall? 0 0 0 0 0  Follow up - Falls evaluation completed Falls evaluation completed Falls evaluation completed Falls evaluation completed    Assessment & Plan  1. Need for hepatitis B vaccination  - Heplisav-B (HepB-CPG) Vaccine  2. Depression, major, recurrent, mild (HCC)  Getting worse, brother committed suicide , advised hospice counseling, she denies suicidal thoughts and ideation  - DULoxetine (CYMBALTA) 60 MG capsule; Take 1 capsule (60 mg total) by mouth daily. 30 first week after that 2 daily  Dispense: 90 capsule; Refill: 0 - ARIPiprazole (ABILIFY) 2 MG tablet; Take 1 tablet (2 mg total) by mouth every evening.  Dispense: 30 tablet; Refill: 0

## 2019-10-26 NOTE — Patient Instructions (Signed)
Referral has been sent to [facility]  P: [phone]

## 2019-12-15 DIAGNOSIS — U071 COVID-19: Secondary | ICD-10-CM

## 2019-12-15 HISTORY — DX: COVID-19: U07.1

## 2020-01-26 ENCOUNTER — Ambulatory Visit: Payer: 59 | Admitting: Family Medicine

## 2020-03-11 ENCOUNTER — Encounter: Payer: Self-pay | Admitting: Family Medicine

## 2020-03-11 ENCOUNTER — Ambulatory Visit: Payer: Medicaid Other | Admitting: Family Medicine

## 2020-03-11 ENCOUNTER — Other Ambulatory Visit: Payer: Self-pay

## 2020-03-11 VITALS — BP 158/83 | Ht 69.0 in | Wt 158.0 lb

## 2020-03-11 DIAGNOSIS — Z3042 Encounter for surveillance of injectable contraceptive: Secondary | ICD-10-CM

## 2020-03-11 DIAGNOSIS — Z3009 Encounter for other general counseling and advice on contraception: Secondary | ICD-10-CM | POA: Diagnosis not present

## 2020-03-11 DIAGNOSIS — E1129 Type 2 diabetes mellitus with other diabetic kidney complication: Secondary | ICD-10-CM

## 2020-03-11 DIAGNOSIS — R030 Elevated blood-pressure reading, without diagnosis of hypertension: Secondary | ICD-10-CM

## 2020-03-11 DIAGNOSIS — Z30013 Encounter for initial prescription of injectable contraceptive: Secondary | ICD-10-CM | POA: Diagnosis not present

## 2020-03-11 DIAGNOSIS — R809 Proteinuria, unspecified: Secondary | ICD-10-CM

## 2020-03-11 MED ORDER — MEDROXYPROGESTERONE ACETATE 150 MG/ML IM SUSP
150.0000 mg | INTRAMUSCULAR | Status: AC
  Administered 2020-03-11 – 2021-02-04 (×4): 150 mg via INTRAMUSCULAR

## 2020-03-11 NOTE — Progress Notes (Signed)
Contraception/Family Planning VISIT ENCOUNTER NOTE  Subjective:   Sarah Duffy is a 49 y.o.  female here for reproductive life counseling. Likes the depo and wants to continue.  Reports she does not want a pregnancy in the next year. Denies abnormal vaginal bleeding, discharge, pelvic pain, problems with intercourse or other gynecologic concerns.    Gynecologic History No LMP recorded. Patient has had an injection. Contraception: Depo-Provera injections  Health Maintenance Due  Topic Date Due  . OPHTHALMOLOGY EXAM  01/07/2019  . MAMMOGRAM  11/26/2019  . COLONOSCOPY  Never done     The following portions of the patient's history were reviewed and updated as appropriate: allergies, current medications, past family history, past medical history, past social history, past surgical history and problem list.  Review of Systems Pertinent items are noted in HPI.   Objective:  BP (!) 158/83   Ht 5\' 9"  (1.753 m)   Wt 158 lb (71.7 kg)   BMI 23.33 kg/m  Gen: well appearing, NAD HEENT: no scleral icterus CV: RR Lung: Normal WOB Ext: warm well perfused  Assessment and Plan:   Contraception counseling: Reviewed all forms of birth control options in the tiered based approach. available including abstinence; over the counter/barrier methods; hormonal contraceptive medication including pill, patch, ring, injection,contraceptive implant; hormonal and nonhormonal IUDs; permanent sterilization options including vasectomy and the various tubal sterilization modalities. Risks, benefits, and typical effectiveness rates were reviewed.  Questions were answered.  Written information was also given to the patient to review.  Patient desires Depo, this was prescribed for patient. She will follow up in  1 yr for surveillance.  She was told to call with any further questions, or with any concerns about this method of contraception.  Emphasized use of condoms 100% of the time for STI prevention.   1.  Elevated blood pressure reading without diagnosis of hypertension Continued elevation today. Per patient she is typically normal range at her PCP office Denies CP, SOB, swelling Encouraged her to make appt with PCP given HTN and diabetes this likely needs to be managed  2. 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[facility]) Reviewed Epic records and this patient has VERY uncontrolled diabetes with last HA1C 12.7% and did not follow up with endocrinology as planned by PCP in Nov 2020.  Unable to get [facility] as the patient does not meet our programmatic requirements Stressed importance of following up with PCP especially since she reports she is "off all her diabetes medications" due to cost and inability to pay for medications.   She is trying to work with the [facility] program to get medications  Recommended the patient reach out to [facility] sites and possibly [facility] and apply for [facility]  3. Encounter for surveillance of injectable contraceptive Safe to continue depo Progesterone only methods are still safest in the setting of HTN Discussed that at age 68 I would recommend she stop Depo and if no menses in 1 year then she had gone through menopause and would not longer need depo for pregnancy prevention. Suggested asking PCP for hormone levels (FSH/LH) after stopping depo to confirm menopause.  - Depo 150mg  IM every 11-13 wks, for 1 year.   4. Housing security - Patient having trouble paying utilities and rent. RN to refer to [facility] for possible options to help client.    Please refer to After Visit Summary for other counseling recommendations.   Return in about 11 weeks (around 05/27/2020) for Depo.    [facility],  MD [place]

## 2020-03-11 NOTE — Progress Notes (Signed)
Pt here for Depo. Pt's last Depo was 10/02/2019, so pt is 23 weeks post last Depo. Pt reports is satisfied with Depo. Pt's BP 158/83.Lyman Speller, RN

## 2020-03-11 NOTE — Progress Notes (Signed)
Depo given (R glut) per Alvester Morin order; tolerated well Richmond Campbell, RN

## 2020-05-29 ENCOUNTER — Other Ambulatory Visit: Payer: Self-pay

## 2020-05-29 ENCOUNTER — Ambulatory Visit (LOCAL_COMMUNITY_HEALTH_CENTER): Payer: Medicaid Other

## 2020-05-29 VITALS — BP 160/85 | Ht 69.0 in | Wt 186.0 lb

## 2020-05-29 DIAGNOSIS — Z30013 Encounter for initial prescription of injectable contraceptive: Secondary | ICD-10-CM

## 2020-05-29 DIAGNOSIS — Z3009 Encounter for other general counseling and advice on contraception: Secondary | ICD-10-CM

## 2020-05-29 MED ORDER — MULTI-VITAMIN/MINERALS PO TABS: 1.0000 | ORAL_TABLET | Freq: Every day | ORAL | 0 refills | Status: DC

## 2020-05-29 NOTE — Progress Notes (Signed)
Due to elevated BP, consult done with Barrington Ellison FNP regarding Depo administration. Per Ms. Kizzie Ide, may administer Depo 150 mg IM today as per 03/11/2020 written order of Dr. Alvester Morin. Client requested Depo  In "right hip" and tolerated injection without complaint. Jossie Ng, RN

## 2020-06-04 DIAGNOSIS — M86172 Other acute osteomyelitis, left ankle and foot: Secondary | ICD-10-CM | POA: Insufficient documentation

## 2020-06-11 NOTE — Progress Notes (Signed)
I  was consulted on the POC for this client.  I agree with the documented note and actions taken to provide care for this client. 

## 2020-06-27 ENCOUNTER — Telehealth: Payer: Self-pay | Admitting: Family Medicine

## 2020-06-27 ENCOUNTER — Ambulatory Visit: Payer: Self-pay

## 2020-06-27 ENCOUNTER — Encounter: Payer: Self-pay | Admitting: Family Medicine

## 2020-06-27 ENCOUNTER — Encounter: Payer: Self-pay | Admitting: Emergency Medicine

## 2020-06-27 DIAGNOSIS — Z79899 Other long term (current) drug therapy: Secondary | ICD-10-CM | POA: Insufficient documentation

## 2020-06-27 DIAGNOSIS — I10 Essential (primary) hypertension: Secondary | ICD-10-CM | POA: Insufficient documentation

## 2020-06-27 DIAGNOSIS — R519 Headache, unspecified: Secondary | ICD-10-CM | POA: Insufficient documentation

## 2020-06-27 DIAGNOSIS — R5381 Other malaise: Secondary | ICD-10-CM | POA: Insufficient documentation

## 2020-06-27 DIAGNOSIS — E119 Type 2 diabetes mellitus without complications: Secondary | ICD-10-CM | POA: Insufficient documentation

## 2020-06-27 DIAGNOSIS — R531 Weakness: Secondary | ICD-10-CM | POA: Insufficient documentation

## 2020-06-27 LAB — BASIC METABOLIC PANEL
Anion gap: 5 (ref 5–15)
BUN: 17 mg/dL (ref 6–20)
CO2: 27 mmol/L (ref 22–32)
Calcium: 8.4 mg/dL — ABNORMAL LOW (ref 8.9–10.3)
Chloride: 103 mmol/L (ref 98–111)
Creatinine, Ser: 0.96 mg/dL (ref 0.44–1.00)
GFR calc Af Amer: >60 mL/min (ref >60)
GFR calc non Af Amer: >60 mL/min (ref >60)
Glucose, Bld: 249 mg/dL — ABNORMAL HIGH (ref 70–99)
Potassium: 3.3 mmol/L — ABNORMAL LOW (ref 3.5–5.1)
Sodium: 135 mmol/L (ref 135–145)

## 2020-06-27 LAB — URINALYSIS, COMPLETE (UACMP) WITH MICROSCOPIC
Bilirubin Urine: NEGATIVE
Glucose, UA: >=500 mg/dL — AB
Ketones, ur: NEGATIVE mg/dL
Leukocytes,Ua: NEGATIVE
Nitrite: NEGATIVE
Protein, ur: 100 mg/dL — AB
Specific Gravity, Urine: 1.005 (ref 1.005–1.030)
pH: 6 (ref 5.0–8.0)

## 2020-06-27 LAB — CBC
HCT: 26.9 % — ABNORMAL LOW (ref 36.0–46.0)
Hemoglobin: 9.1 g/dL — ABNORMAL LOW (ref 12.0–15.0)
MCH: 28.5 pg (ref 26.0–34.0)
MCHC: 33.8 g/dL (ref 30.0–36.0)
MCV: 84.3 fL (ref 80.0–100.0)
Platelets: 255 10*3/uL (ref 150–400)
RBC: 3.19 MIL/uL — ABNORMAL LOW (ref 3.87–5.11)
RDW: 13.9 % (ref 11.5–15.5)
WBC: 6.6 10*3/uL (ref 4.0–10.5)
nRBC: 0 % (ref 0.0–0.2)

## 2020-06-27 LAB — LACTIC ACID, PLASMA: Lactic Acid, Venous: 1.2 mmol/L (ref 0.5–1.9)

## 2020-06-27 LAB — TROPONIN I (HIGH SENSITIVITY): Troponin I (High Sensitivity): 9 ng/L (ref <18)

## 2020-06-27 NOTE — ED Triage Notes (Signed)
Pt c/o hypertension, dizziness and weakness x1 day. Pt currently with [facility] for home antibiotics for recent infection in the left foot. Pts home health nurse advised pt to come to ED to get BP under control.

## 2020-06-27 NOTE — Telephone Encounter (Signed)
[name] from [facility] health called stating that she stopped to see Sarah Duffy and she calibrated her BP machine with hers She has recorded BP of 172/86 174/87 and 170/80. She states patient will go ER for evaluation.  She has given S/S to

## 2020-06-27 NOTE — Telephone Encounter (Signed)
Sarah Duffy from [facility] home health called stating that she had stopped to visit the patient.  She has checked her BP machine to her machine.  She states that she has recorded BP 172/86,174/87 and 170/80. She has gone over S/S to watch for with hypertension. Patient will still go to ER for evaluation. Patient with HA and weakness.

## 2020-06-27 NOTE — Telephone Encounter (Signed)
[facility] calling back.  Call dropped before I was able to speak with her.

## 2020-06-27 NOTE — Telephone Encounter (Signed)
Patient called stating that she has been hospitalized with a infection in her foot  At [facility].  She states while she was hospitalized her BP was high.  The doctors treated her and sent her home.  Today she has recorded her BP at 188/107 HR 89.  She states that she feels weak and has a headache. Patient has taken her medication. Per protocol patient will return to [facility] for symptomatic hypertension.  Care advice was read to patient.  She verbalized understanding of all information.  Reason for Disposition . [1] Systolic BP  >= 160 OR Diastolic >= 100 AND [2] cardiac or neurologic symptoms (e.g., chest pain, difficulty breathing, unsteady gait, blurred vision)  Answer Assessment - Initial Assessment Questions 1. BLOOD PRESSURE: "What is the blood pressure?" "Did you take at least two measurements 5 minutes apart?"     188/107 HR 89 2. ONSET: "When did you take your blood pressure?"    Since infection in foot 3. HOW: "How did you obtain the blood pressure?" (e.g., visiting nurse, automatic home BP monitor)    Home monitor 4. HISTORY: "Do you have a history of high blood pressure?"    no 5. MEDICATIONS: "Are you taking any medications for blood pressure?" "Have you missed any doses recently?"     none 6. OTHER SYMPTOMS: "Do you have any symptoms?" (e.g., headache, chest pain, blurred vision, difficulty breathing, weakness)    Weak , headache 7. PREGNANCY: "Is there any chance you are pregnant?" "When was your last menstrual period?"    N/A  Protocols used: BLOOD PRESSURE - HIGH-A-AH

## 2020-06-27 NOTE — Telephone Encounter (Signed)
This encounter was created in error - please disregard.

## 2020-06-28 ENCOUNTER — Emergency Department: Payer: Self-pay

## 2020-06-28 ENCOUNTER — Emergency Department
Admission: EM | Admit: 2020-06-28 | Discharge: 2020-06-28 | Disposition: A | Discharge status: Home / Self Care | Payer: Self-pay | Attending: Emergency Medicine | Admitting: Emergency Medicine

## 2020-06-28 DIAGNOSIS — R519 Headache, unspecified: Secondary | ICD-10-CM

## 2020-06-28 DIAGNOSIS — I10 Essential (primary) hypertension: Secondary | ICD-10-CM

## 2020-06-28 NOTE — ED Notes (Signed)
Patient discharged to home per MD order. Patient in stable condition, and deemed medically cleared by ED provider for discharge. Discharge instructions reviewed with patient/family using "Teach Back"; verbalized understanding of medication education and administration, and information about follow-up care. Denies further concerns. ° °

## 2020-06-28 NOTE — ED Provider Notes (Signed)
[facility] Emergency Department Provider Note   ____________________________________________   First MD Initiated Contact with Patient 06/28/20 0139     (approximate)  I have reviewed the triage vital signs and the nursing notes.   HISTORY  Chief Complaint Hypertension    HPI Sarah Duffy is a 49 y.o. female with past medical history of hypertension, diabetes, and left foot osteomyelitis presents to the ED complaining of hypertension.  Patient reports that she has been feeling generally weak and malaised throughout the day today and when home health checked her blood pressure it was significantly elevated.  She then rechecked her blood pressure later on in the evening, when remained very high.  She spoke further with home health and her PCPs office, who recommended she be evaluated in the ED.  She complains of diffuse headache that has been gradually worsening throughout the day along with generalized weakness, denies any focal numbness or weakness and has not had any issues with her vision or speech.  She states her foot has seemed to be healing well and has not had any increased pain, drainage, or fever.  She continues to receive IV antibiotics via right upper extremity PICC line.        Past Medical History:  Diagnosis Date  . Diabetes mellitus without complication (HCC)   . MRSA infection within last 3 months 02/25/2016  . Osteomyelitis of foot (HCC) 08/26/2016    Patient Active Problem List   Diagnosis Date Noted  . Depression, major, recurrent, mild (HCC) 09/25/2019  . History of 2019 novel coronavirus disease (COVID-19) 06/23/2019  . Elevated blood pressure reading without diagnosis of hypertension 03/03/2018  . Migraine headache without aura 01/14/2018  . 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(HCC) 05/22/2017  . Diabetic retinopathy of left eye, with macular edema, with severe nonproliferative retinopathy, associated with type 2 diabetes  mellitus 05/21/2016  . 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 Hyperlipidemia 03/10/2016  . MI (mitral incompetence) 02/25/2016  . Insomnia 02/25/2016  . Uncontrolled type 2 diabetes mellitus with foot ulcer (HCC) 02/03/2016  . Anxiety 09/04/2014    Past Surgical History:  Procedure Laterality Date  . CHOLECYSTECTOMY  1999  . TOE SURGERY Left 02/07/2016   Pinky Toe    Prior to Admission medications   Medication Sig Start Date End Date Taking? Authorizing Provider  albuterol (VENTOLIN HFA) 108 (90 Base) MCG/ACT inhaler INHALE 2 PUFFS INTO THE LUNGS EVERY 6 HOURS AS NEEDED FOR WHEEZING OR SHORTNESS OF BREATH Patient not taking: Reported on 05/29/2020 07/30/19   Doren Custard, FNP  ARIPiprazole (ABILIFY) 2 MG tablet Take 1 tablet (2 mg total) by mouth every evening. Patient not taking: Reported on 03/11/2020 10/26/19   [name], MD  aspirin EC 81 MG tablet Take 1 tablet (81 mg total) by mouth daily. Patient not taking: Reported on 05/29/2020 03/10/16   [name], MD  atorvastatin (LIPITOR) 40 MG tablet Take 1 tablet (40 mg total) by mouth daily. Patient not taking: Reported on 03/11/2020 09/25/19   [name], MD  budesonide-formoterol [facility]) 160-4.5 MCG/ACT inhaler Inhale 2 puffs into the lungs 2 (two) times daily. Patient not taking: Reported on 03/11/2020 09/25/19   [name], MD  Continuous Blood Gluc Receiver (FREESTYLE LIBRE 14 DAY READER) DEVI 1 each by Does not apply route 4 (four) times daily. Patient not taking: Reported on 05/29/2020 06/23/19   Doren Custard, FNP  Continuous Blood Gluc Sensor (FREESTYLE LIBRE 14 DAY SENSOR) MISC 1 each by Does not apply route 4 (four) times daily. Patient not taking: Reported on 05/29/2020 09/25/19   [name], MD  DULoxetine (CYMBALTA) 60 MG capsule Take 1 capsule (60 mg total) by mouth daily. 30 first week after  that 2 daily Patient not taking: Reported on 03/11/2020 10/26/19   [name], MD  empagliflozin (JARDIANCE) 10 MG TABS tablet Take 10 mg by mouth daily. Patient not taking: Reported on 03/11/2020 09/25/19   [name], MD  insulin degludec (TRESIBA FLEXTOUCH) 100 UNIT/ML SOPN FlexTouch Pen Inject 0.16-0.5 mLs (16-50 Units total) into the skin daily. Patient not taking: Reported on 03/11/2020 09/25/19   [name], MD  Insulin Pen Needle (NOVOFINE) 32G X 6 MM MISC 1 each by Does not apply route once a week. Use with Ozempic Patient not taking: Reported on 03/11/2020 06/25/19   Doren Custard, FNP  Multiple Vitamin (MULTIVITAMIN) capsule Take by mouth.    [provider]  Multiple Vitamins-Minerals (MULTIVITAMIN WITH MINERALS) tablet Take 1 tablet by mouth daily. 05/29/20   Federico Flake, MD  olmesartan-hydrochlorothiazide (BENICAR HCT) 20-12.5 MG tablet Take 1 tablet by mouth daily. Patient not taking: Reported on 03/11/2020 09/25/19   [name], MD  SUMAtriptan (IMITREX) 100 MG tablet Take 1 tablet (100 mg total) by mouth every 2 (two) hours as needed for migraine. Max of 2 in 24 hours Patient not taking: Reported on 03/11/2020 09/25/19   [name], MD  topiramate (TOPAMAX) 100 MG tablet Take 1 tablet (100 mg total) by mouth 2 (two) times daily. Patient not taking: Reported on 03/11/2020 09/25/19   [name], MD    Allergies Patient has no known allergies.  Family History  Problem Relation Age of Onset  . Diabetes Mother   . Ulcers Mother   . Heart disease Father   . AAA (abdominal aortic aneurysm) Father   . Diabetes Father   . Hypertension Father   . Stroke Father   . Alzheimer's disease Father   . Heart attack Sister   . Seizures Brother   . Diabetes Maternal Grandmother   . Breast cancer Maternal Grandmother     Social History Social History   Tobacco Use  . Smoking status: Never Smoker  . Smokeless tobacco: Never Used  Vaping  Use  . Vaping Use: Never used  Substance Use Topics  . Alcohol use: No    Alcohol/week: 0.0 standard drinks  . Drug use: No    Review of Systems  Constitutional: No fever/chills.  Positive for generalized weakness and malaise. Eyes: No visual changes. ENT: No sore throat. Cardiovascular: Denies chest pain. Respiratory: Denies shortness of breath. Gastrointestinal: No abdominal pain.  No nausea, no vomiting.  No diarrhea.  No constipation. Genitourinary: Negative for dysuria. Musculoskeletal: Negative for back pain. Skin: Negative for rash. Neurological: Positive for headaches, negative for focal weakness or numbness.  ____________________________________________   PHYSICAL EXAM:  VITAL SIGNS: ED Triage Vitals [06/27/20 1955]  Enc Vitals Group     BP (!) 161/88     Pulse Rate 91     Resp 18     Temp 98.6 F (37 C)     Temp Source Oral     SpO2 100 %     Weight      Height      Head Circumference      Peak Flow      Pain Score 0  Pain Loc      Pain Edu?      Excl. in GC?     Constitutional: Alert and oriented. Eyes: Conjunctivae are normal. Head: Atraumatic. Nose: No congestion/rhinnorhea. Mouth/Throat: Mucous membranes are moist. Neck: Normal ROM Cardiovascular: Normal rate, regular rhythm. Grossly normal heart sounds. Respiratory: Normal respiratory effort.  No retractions. Lungs CTAB. Gastrointestinal: Soft and nontender. No distention. Genitourinary: deferred Musculoskeletal: No lower extremity tenderness nor edema. Neurologic:  Normal speech and language. No gross focal neurologic deficits are appreciated. Skin:  Skin is warm, dry and intact. No rash noted. Psychiatric: Mood and affect are normal. Speech and behavior are normal.  ____________________________________________   LABS (all labs ordered are listed, but only abnormal results are displayed)  Labs Reviewed  BASIC METABOLIC PANEL - Abnormal; Notable for the following components:       Result Value   Potassium 3.3 (*)    Glucose, Bld 249 (*)    Calcium 8.4 (*)    All other components within normal limits  CBC - Abnormal; Notable for the following components:   RBC 3.19 (*)    Hemoglobin 9.1 (*)    HCT 26.9 (*)    All other components within normal limits  URINALYSIS, COMPLETE (UACMP) WITH MICROSCOPIC - Abnormal; Notable for the following components:   Color, Urine YELLOW (*)    APPearance CLEAR (*)    Glucose, UA >=500 (*)    Hgb urine dipstick SMALL (*)    Protein, ur 100 (*)    Bacteria, UA RARE (*)    All other components within normal limits  LACTIC ACID, PLASMA  LACTIC ACID, PLASMA  CBG MONITORING, ED  TROPONIN I (HIGH SENSITIVITY)  TROPONIN I (HIGH SENSITIVITY)   ____________________________________________  EKG  ED ECG REPORT I, Chesley Noon, the attending physician, personally viewed and interpreted this ECG.   Date: 06/28/2020  EKG Time: 19:59  Rate: 88  Rhythm: normal sinus rhythm  Axis: Normal  Intervals:left anterior fascicular block  ST&T Change: None   PROCEDURES  Procedure(s) performed (including Critical Care):  Procedures   ____________________________________________   INITIAL IMPRESSION / ASSESSMENT AND PLAN / ED COURSE       49 year old female with possible history of hypertension, diabetes, and left foot osteomyelitis presents to the ED complaining of elevated blood pressure throughout the day today along with generalized weakness and headache.  She has no focal deficits noted on exam to suggest stroke, but given her headache we will check CT head.  Her blood pressure remains elevated here in the ED but there is no evidence of hypertensive emergency thus far.  EKG and troponin are negative, renal function stable.  She was recently started on blood pressure medications during admission for osteomyelitis and these medications will likely require further titrating by her PCP.  Diabetic ulcer to her left foot does appear to  be healing well and there are no signs or symptoms to suggest worsening infection at this time.  If CT head is unremarkable, patient would be appropriate for discharge home with PCP follow-up.  CT head is negative for acute process, no evidence of hypertensive emergency at this time.  Patient is appropriate for discharge home with PCP follow-up, was counseled to return to the ED for new or worsening symptoms.  Patient agrees with plan.      ____________________________________________   FINAL CLINICAL IMPRESSION(S) / ED DIAGNOSES  Final diagnoses:  Essential hypertension  Acute nonintractable headache, unspecified headache type     ED Discharge  Orders    None       Note:  This document was prepared using Dragon voice recognition software and may include unintentional dictation errors.   Chesley NoonJessup, Asiana Benninger, MD 06/28/20 [phone]

## 2020-06-28 NOTE — ED Notes (Signed)
Iodine dressing reapplied to left foot. Pt awaiting CT scan.

## 2020-07-22 ENCOUNTER — Ambulatory Visit: Payer: Medicaid Other | Attending: Internal Medicine

## 2020-07-22 DIAGNOSIS — Z23 Encounter for immunization: Secondary | ICD-10-CM

## 2020-07-22 NOTE — Progress Notes (Signed)
   Covid-19 Vaccination Clinic  Name:  Aissa Lisowski    MRN: [id number] DOB: 1971-11-05  07/22/2020  Ms. Whitecotton was observed post Covid-19 immunization for 15 minutes without incident. She was provided with Vaccine Information Sheet and instruction to access the V-Safe system.   Ms. Bobak was instructed to call 911 with any severe reactions post vaccine: Marland Kitchen Difficulty breathing  . Swelling of face and throat  . A fast heartbeat  . A bad rash all over body  . Dizziness and weakness   Immunizations Administered    Name Date Dose VIS Date Route   Pfizer COVID-19 Vaccine 07/22/2020  3:10 PM 0.3 mL 02/07/2019 Intramuscular   Manufacturer: [employer], [employer]   Lot: [id number]   NDC: [address]-7

## 2020-08-12 ENCOUNTER — Ambulatory Visit: Payer: Medicaid Other

## 2020-08-16 ENCOUNTER — Ambulatory Visit (LOCAL_COMMUNITY_HEALTH_CENTER): Payer: Medicaid Other

## 2020-08-16 ENCOUNTER — Other Ambulatory Visit: Payer: Self-pay

## 2020-08-16 VITALS — BP 157/85 | Ht 69.0 in | Wt 185.0 lb

## 2020-08-16 DIAGNOSIS — Z30013 Encounter for initial prescription of injectable contraceptive: Secondary | ICD-10-CM | POA: Diagnosis not present

## 2020-08-16 DIAGNOSIS — Z3009 Encounter for other general counseling and advice on contraception: Secondary | ICD-10-CM | POA: Diagnosis not present

## 2020-08-16 MED ORDER — MULTI-VITAMIN/MINERALS PO TABS: 1.0000 | ORAL_TABLET | Freq: Every day | ORAL | 0 refills | Status: DC

## 2020-08-16 NOTE — Progress Notes (Signed)
Pt is 11 weeks 2 days post depo. BP elevated today at 157/85 and after 10 min. 144/76. Pt reports hx of HBP and has not taken daily meds yet. Consult with Beatris Si, PA who advises to proceed with Depo today based on order by K. Alvester Morin, MD dated 03/11/2020. Per provider recommendation,  pt counseled that continuance of Depo will be assessed (based on age and menopause status) at her next physical which is  due 03/11/2021.  Pt in agreement. DMPA 150 mg IM given Left UOQ without difficulty.  Multivitamins dispensed per pt request. Jerel Shepherd, RN

## 2020-08-16 NOTE — Progress Notes (Signed)
Consulted by RN re:  Patient due to get DMPA and elevated BP.  Reviewed RN note and agree that it reflects discussion and recommendations.

## 2020-11-12 ENCOUNTER — Ambulatory Visit: Payer: Medicaid Other

## 2020-12-01 IMAGING — CR DG CHEST 2V
1 series · 2 of 2 positions shown · non-contrast
Comparison: No priors.

CLINICAL DATA: 47-year-old female with history of shortness of
breath, dry cough and wheezing. Tested positive for [id number] in May 2019.

EXAM:
CHEST - 2 VIEW

[Series 1: dg chest 2 view · 0.14mm/px · 2 of 2 slices shown]
[im 1/2]
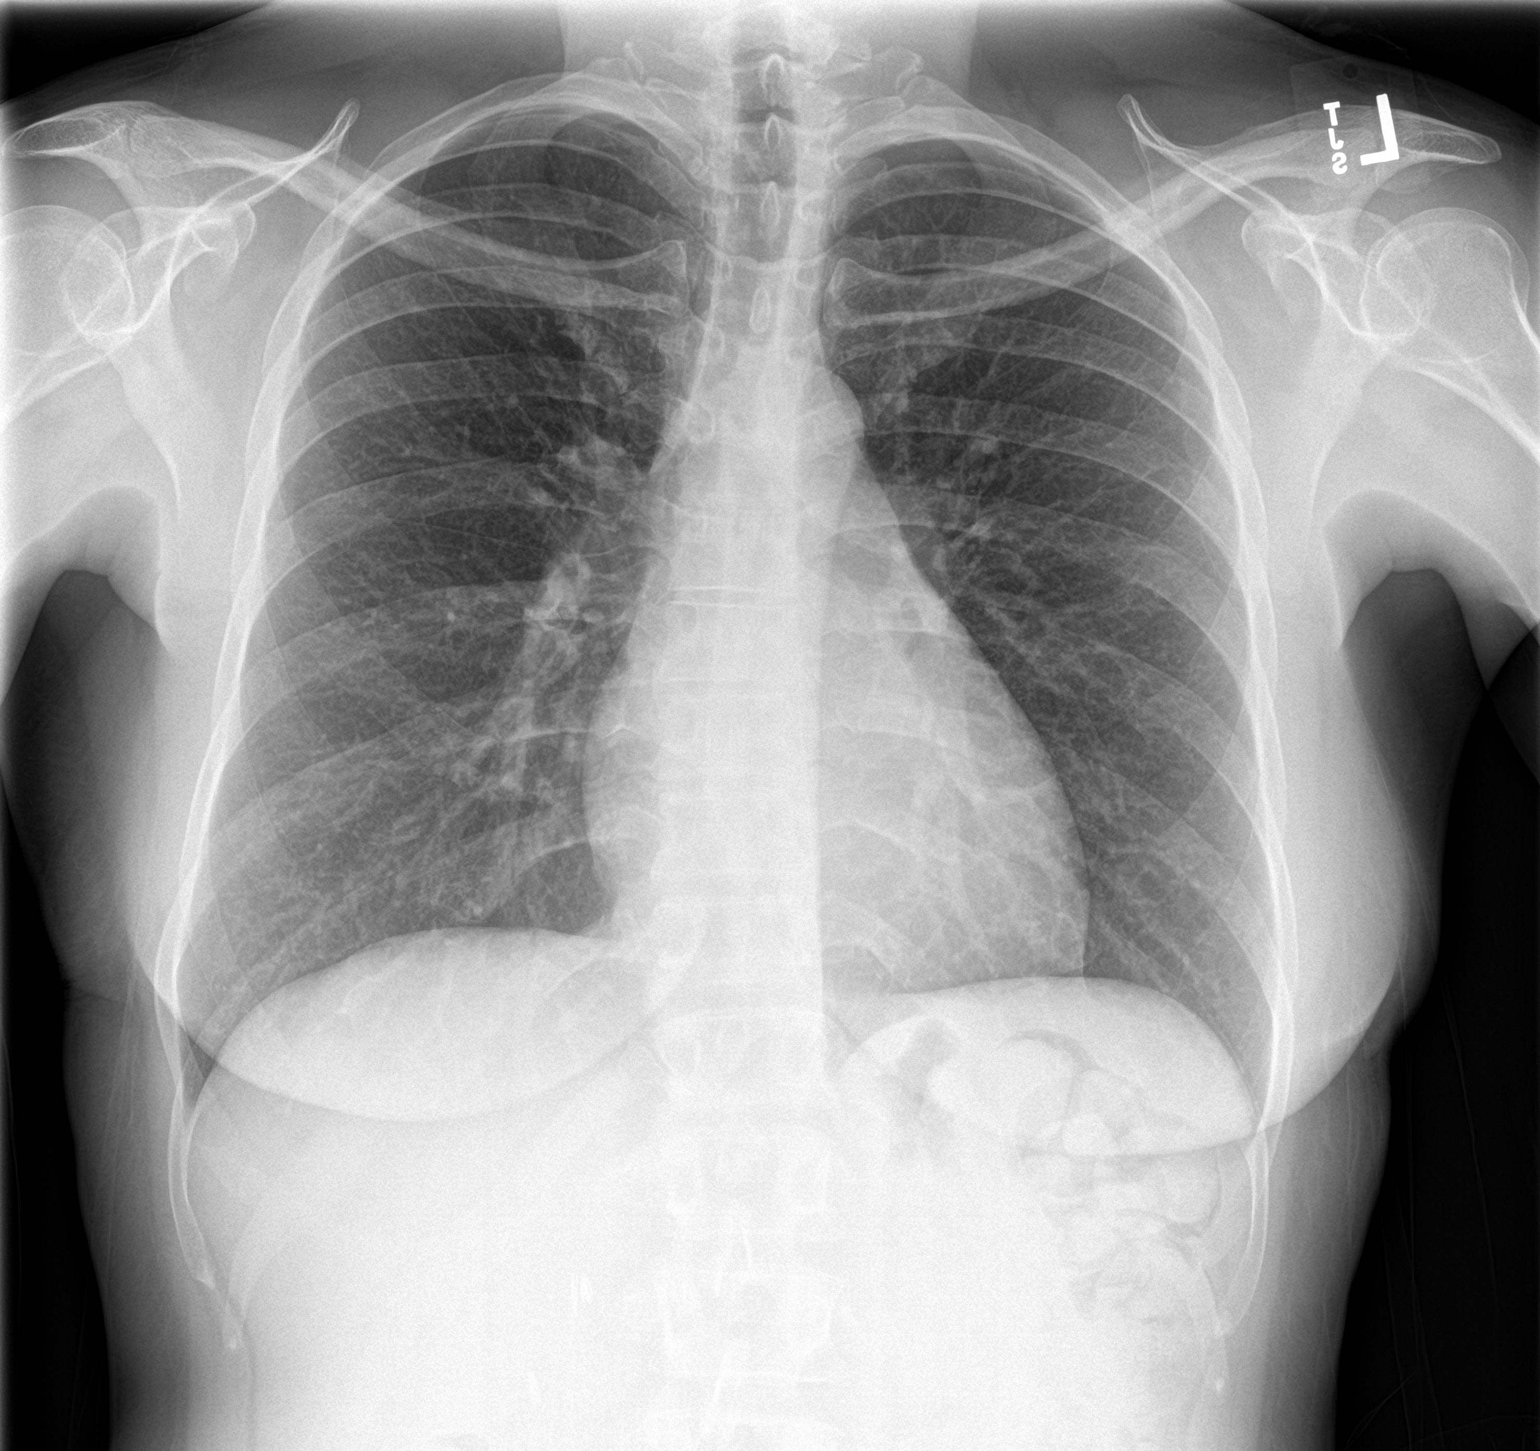
[im 2/2]
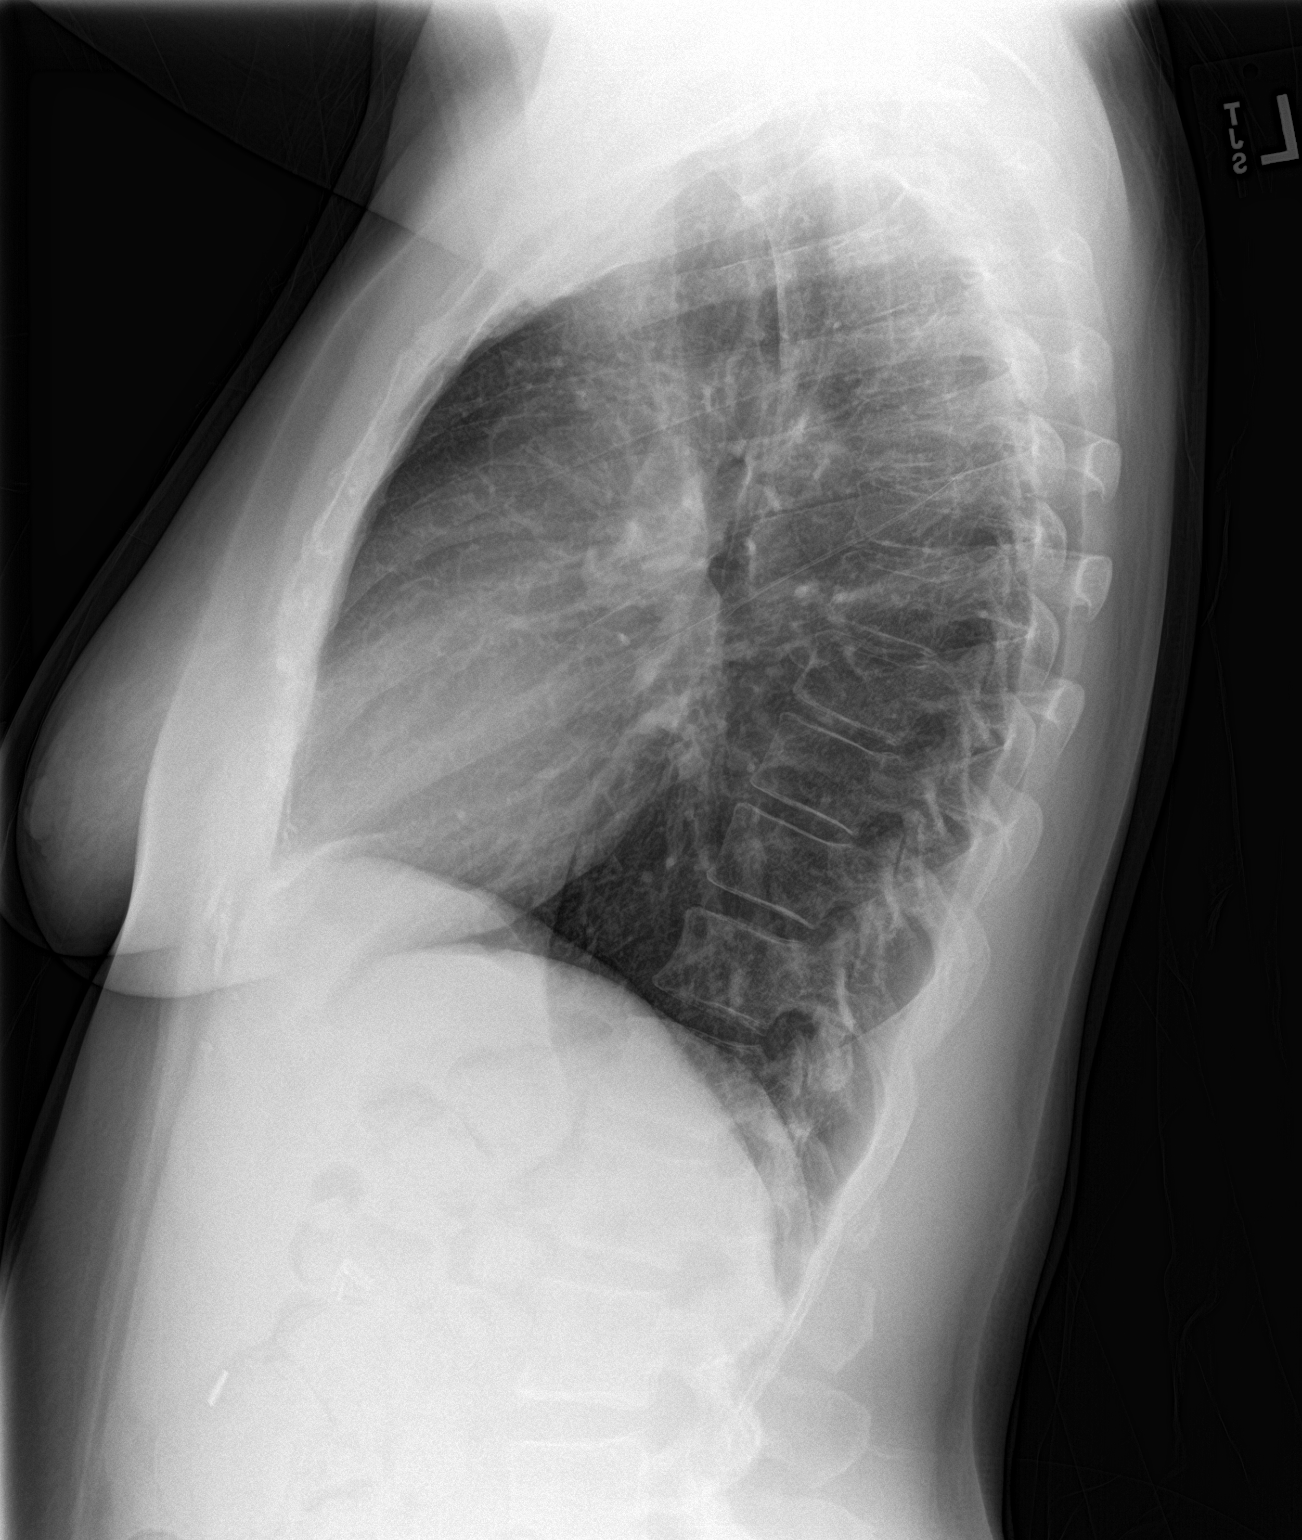

[2 of 2 positions shown; findings below may reference images not displayed]

FINDINGS: Lung volumes are normal. No consolidative airspace disease. No
pleural effusions. No pneumothorax. No pulmonary nodule or mass
noted. Pulmonary vasculature and the cardiomediastinal silhouette
are within normal limits. Surgical clips project over the right
upper quadrant of the abdomen, likely from prior cholecystectomy.
IMPRESSION: No radiographic evidence of acute cardiopulmonary disease.

## 2021-02-04 ENCOUNTER — Encounter: Payer: Self-pay | Admitting: Family Medicine

## 2021-02-04 ENCOUNTER — Other Ambulatory Visit: Payer: Self-pay

## 2021-02-04 ENCOUNTER — Ambulatory Visit (LOCAL_COMMUNITY_HEALTH_CENTER): Payer: Medicaid Other | Admitting: Family Medicine

## 2021-02-04 VITALS — BP 165/95 | Ht 69.0 in | Wt 189.4 lb

## 2021-02-04 DIAGNOSIS — Z3009 Encounter for other general counseling and advice on contraception: Secondary | ICD-10-CM

## 2021-02-04 DIAGNOSIS — Z3042 Encounter for surveillance of injectable contraceptive: Secondary | ICD-10-CM | POA: Diagnosis not present

## 2021-02-04 NOTE — Progress Notes (Signed)
Patient here for PE and Depo. Patient last PE was 03/11/2020. Last depo was 08/16/2020, 24 4/7 weeks ago. Patient counseled that she is not due for a PE today, and that she has a PE scheduled with her PCP in May 2022. Marland KitchenBurt Knack, RN

## 2021-02-04 NOTE — Progress Notes (Signed)
S: Patient here for Depo provera injection.  RN consulted with provider d/t elevated b/p.  Patient has no periods and last sex was 1 1/2 year ago.  Denies, HA, vision changes, blood clots in legs or lungs, or dizziness.   O: Patient's depo is 34w4days past due. B/p-165/95, then 159/92, and manual was 152/88 after 15 mins of resting.   A/P: Discussed with patient on importance of seeing PCP for blood pressure management.  Patient also needs to see PCP for [facility] and LH to see if patient is  Menopausal.   Patient has appointment scheduled with PCP for 04/29/2021.  Ok for nursing to give Depo today.   Wendi Snipes, FNP

## 2021-02-04 NOTE — Progress Notes (Signed)
Repeat BP 152/88 manual, reviewed with provider. Depo given per provider orders and patient counseled to ask about  FSH/LH testing at her 04/2021 PCP appointment to determine if she is in menopause. Patient given next Depo card and counseled to wait for hormone test results before scheduling next Depo appointment.Burt Knack, RN

## 2021-02-07 ENCOUNTER — Other Ambulatory Visit: Payer: Self-pay | Admitting: Family Medicine

## 2021-02-07 DIAGNOSIS — Z1231 Encounter for screening mammogram for malignant neoplasm of breast: Secondary | ICD-10-CM

## 2021-02-10 NOTE — Progress Notes (Signed)
Name: Sarah Duffy   MRN: [id number]    DOB: [birth year]   Date:02/11/2021       Progress Note  Subjective  Chief Complaint  Anxiety/Grief/Heartburn  HPI  MDD: she had a gap in her insurance coverage and has been without medications for months. She lost her mother unexpectedly on Feb 4 th, she developed indigestion and by the time she got to the [facility] she died with a cardiac arrest. She has not been going to work since, she is feeling overwhelmed. She states when her mother diet, her sister moved in and has been stressing her out. She would like to resume duloxetine and Abilify , also wiling to see a psychiatrist   DMII: history of left foot ulcer and osteomyelitis, microalbuminuria, hypertension, retinopathy and dyslipidemia. She had another foot ulcer last year, had to be seen at [facility], took medications for a few months but had gap of insurance and has been out of all medications since Fall 2021. She states she will follow up with podiatrist at [facility], also due for eye exam. We will recheck labs. She was on Basglar and victoza but we will try getting approval for Xultophy ( she took in the past) to increase compliance. We will hold off on pre-meal insulin for now. Resume jardiance, atorvastatin and ARB  HTN: bp is elevated, she denies chest pain or palpitation . We will resume Benicar hctz that she took in the past without side effects.   Dysphagia: she has noticed some substernal chest fullness, heartburn and also some dysphagia over the past month , around the time her mother diet. She is also due for colonoscopy. She is very concerned about heart disease and is high risk for heart disease. We will refer for GI, and also cardiologist  Patient Active Problem List   Diagnosis Date Noted  . Osteomyelitis of foot, left, acute (HCC) 06/04/2020  . Depression, major, recurrent, mild (HCC) 09/25/2019  . History of 2019 novel coronavirus disease (COVID-19) 06/23/2019  . Elevated blood pressure reading  without diagnosis of hypertension 03/03/2018  . Migraine headache without aura 01/14/2018  . 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(HCC) 05/22/2017  . Diabetic retinopathy of left eye, with macular edema, with severe nonproliferative retinopathy, associated with type 2 diabetes mellitus 05/21/2016  . 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 Hyperlipidemia 03/10/2016  . MI (mitral incompetence) 02/25/2016  . Insomnia 02/25/2016  . Uncontrolled type 2 diabetes mellitus with foot ulcer (HCC) 02/03/2016  . Anxiety 09/04/2014    Past Surgical History:  Procedure Laterality Date  . CHOLECYSTECTOMY  1999  . TOE SURGERY Left 02/07/2016   Pinky Toe    Family History  Problem Relation Age of Onset  . Diabetes Mother   . Ulcers Mother   . Heart disease Father   . AAA (abdominal aortic aneurysm) Father   . Diabetes Father   . Hypertension Father   . Stroke Father   . Alzheimer's disease Father   . Heart attack Sister   . Seizures Brother   . Diabetes Maternal Grandmother   . Breast cancer Maternal Grandmother     Social History   Tobacco Use  . Smoking status: Never Smoker  . Smokeless tobacco: Never Used  Substance Use Topics  . Alcohol use: No    Alcohol/week: 0.0 standard drinks     Current Outpatient Medications:  .  Insulin Degludec-Liraglutide (XULTOPHY) 100-3.6 UNIT-MG/ML SOPN, Inject 16-50 Units  into the skin daily., Disp: 15 mL, Rfl: 2 .  Multiple Vitamins-Minerals (MULTIVITAMIN WITH MINERALS) tablet, Take 1 tablet by mouth daily., Disp: 100 tablet, Rfl: 0 .  ARIPiprazole (ABILIFY) 2 MG tablet, Take 1 tablet (2 mg total) by mouth every evening., Disp: 90 tablet, Rfl: 0 .  atorvastatin (LIPITOR) 40 MG tablet, Take 1 tablet (40 mg total) by mouth daily., Disp: 90 tablet, Rfl: 1 .  Continuous Blood Gluc Sensor (FREESTYLE LIBRE 14 DAY SENSOR) MISC, 1 each by Does not apply  route 4 (four) times daily., Disp: 2 each, Rfl: 2 .  DULoxetine (CYMBALTA) 60 MG capsule, Take 1 capsule (60 mg total) by mouth daily. 30 first week after that 2 daily, Disp: 90 capsule, Rfl: 0 .  empagliflozin (JARDIANCE) 10 MG TABS tablet, Take 1 tablet (10 mg total) by mouth daily., Disp: 30 tablet, Rfl: 2 .  Multiple Vitamin (MULTIVITAMIN) capsule, Take by mouth. (Patient not taking: Reported on 02/04/2021), Disp: , Rfl:  .  olmesartan-hydrochlorothiazide (BENICAR HCT) 20-12.5 MG tablet, Take 1 tablet by mouth daily., Disp: 90 tablet, Rfl: 1 .  SUMAtriptan (IMITREX) 100 MG tablet, Take 1 tablet (100 mg total) by mouth every 2 (two) hours as needed for migraine. Max of 2 in 24 hours, Disp: 10 tablet, Rfl: 1  No Known Allergies  I personally reviewed active problem list, medication list, allergies, family history, social history, health maintenance with the patient/caregiver today.   ROS  Constitutional: Negative for fever , positive for  weight change.  Respiratory: Negative for cough and shortness of breath.   Cardiovascular: Negative for chest pain or palpitations.  Gastrointestinal: Negative for abdominal pain, no bowel changes.  Musculoskeletal: Negative for gait problem or joint swelling.  Skin: Negative for rash.  Neurological: Negative for dizziness , positive for intermittent  headache.  No other specific complaints in a complete review of systems (except as listed in HPI above).   Objective  Vitals:   02/11/21 1334 02/11/21 1342  BP: (!) 150/90 (!) 152/84  Pulse: 99   Resp: 16   Temp: 98.8 F (37.1 C)   TempSrc: Oral   SpO2: 99%   Weight: 196 lb 1.6 oz (89 kg)   Height: 5\' 9"  (1.753 m)     Body mass index is 28.96 kg/m.  Physical Exam  Constitutional: Patient appears well-developed and well-nourished. Overweight.  No distress.  HEENT: head atraumatic, normocephalic, pupils equal and reactive to light, neck supple Cardiovascular: Normal rate, regular rhythm and  normal heart sounds.  No murmur heard. No BLE edema. Pulmonary/Chest: Effort normal and breath sounds normal. No respiratory distress. Abdominal: Soft.  There is no tenderness. Psychiatric: Patient has a normal mood and affect. behavior is normal. Judgment and thought content normal.    PHQ2/9: Depression screen [facility] 2/9 02/11/2021 10/26/2019 09/25/2019 08/02/2019 06/23/2019  Decreased Interest 2 1 1  0 0  Down, Depressed, Hopeless 2 1 0 0 0  PHQ - 2 Score 4 2 1  0 0  Altered sleeping 3 0 3 0 0  Tired, decreased energy 3 3 2 1  0  Change in appetite 1 0 0 0 0  Feeling bad or failure about yourself  1 1 0 0 0  Trouble concentrating 1 1 0 0 0  Moving slowly or fidgety/restless 1 1 0 0 0  Suicidal thoughts 0 0 0 0 0  PHQ-9 Score 14 8 6 1  0  Difficult doing work/chores - Somewhat difficult Not difficult at all Not difficult at all Not  difficult at all    phq 9 is positive   Fall Risk: Fall Risk  02/11/2021 09/25/2019 08/02/2019 06/23/2019 06/21/2019  Falls in the past year? 1 0 0 0 0  Number falls in past yr: 0 0 0 0 0  Injury with Fall? 1 0 0 0 0  Follow up - - Falls evaluation completed Falls evaluation completed Falls evaluation completed     Functional Status Survey: Is the patient deaf or have difficulty hearing?: No Does the patient have difficulty seeing, even when wearing glasses/contacts?: No Does the patient have difficulty concentrating, remembering, or making decisions?: Yes Does the patient have difficulty walking or climbing stairs?: No Does the patient have difficulty dressing or bathing?: No Does the patient have difficulty doing errands alone such as visiting a doctor's office or shopping?: No    Assessment & Plan  1. 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(HCC)  - Microalbumin / creatinine urine ratio - Hemoglobin A1c - empagliflozin (JARDIANCE) 10 MG TABS tablet; Take 1 tablet (10 mg total) by mouth daily.  Dispense: 30 tablet; Refill: 2 - Continuous Blood Gluc  Sensor (FREESTYLE LIBRE 14 DAY SENSOR) MISC; 1 each by Does not apply route 4 (four) times daily.  Dispense: 2 each; Refill: 2 - atorvastatin (LIPITOR) 40 MG tablet; Take 1 tablet (40 mg total) by mouth daily.  Dispense: 90 tablet; Refill: 1  2. Hypertension, uncontrolled  - COMPLETE METABOLIC PANEL WITH GFR - olmesartan-hydrochlorothiazide (BENICAR HCT) 20-12.5 MG tablet; Take 1 tablet by mouth daily.  Dispense: 90 tablet; Refill: 1  3. Hypertension associated with diabetes (HCC)  - olmesartan-hydrochlorothiazide (BENICAR HCT) 20-12.5 MG tablet; Take 1 tablet by mouth daily.  Dispense: 90 tablet; Refill: 1  4. Depression, major, recurrent, mild (HCC)  - Ambulatory referral to Psychiatry - DULoxetine (CYMBALTA) 60 MG capsule; Take 1 capsule (60 mg total) by mouth daily. 30 first week after that 2 daily  Dispense: 90 capsule; Refill: 0 - ARIPiprazole (ABILIFY) 2 MG tablet; Take 1 tablet (2 mg total) by mouth every evening.  Dispense: 90 tablet; Refill: 0  5. Grieving  - Ambulatory referral to Psychiatry  6. Anemia, unspecified type  - CBC with Differential/Platelet - Iron, TIBC and Ferritin Panel  7. Dyslipidemia due to type 2 diabetes mellitus (HCC)  - Lipid panel - atorvastatin (LIPITOR) 40 MG tablet; Take 1 tablet (40 mg total) by mouth daily.  Dispense: 90 tablet; Refill: 1  8. Left-sided chest pain   9. Dysphagia, unspecified type  - Ambulatory referral to Cardiology  10. Colon cancer screening  - Ambulatory referral to Gastroenterology  11. Need for hepatitis B vaccination  - Heplisav-B (HepB-CPG) Vaccine  12. 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  Dispense: 2 each; Refill: 2  13. Depression, major, recurrent, mild (HCC)  - Ambulatory referral to  Psychiatry - DULoxetine (CYMBALTA) 60 MG capsule; Take 1 capsule (60 mg total) by mouth daily. 30 first week after that 2 daily  Dispense: 90 capsule; Refill: 0 - ARIPiprazole (ABILIFY) 2 MG tablet; Take 1 tablet (2 mg total) by mouth every evening.  Dispense: 90 tablet; Refill: 0  14. Migraine without aura and without status migrainosus, not intractable  - SUMAtriptan (IMITREX) 100 MG tablet; Take 1 tablet (100 mg total) by mouth every 2 (two) hours as needed  for migraine. Max of 2 in 24 hours  Dispense: 10 tablet; Refill: 1  15. Need for immunization against influenza  - Flu Vaccine QUAD 36+ mos IM

## 2021-02-11 ENCOUNTER — Encounter: Payer: Self-pay | Admitting: Family Medicine

## 2021-02-11 ENCOUNTER — Ambulatory Visit
Admission: RE | Admit: 2021-02-11 | Discharge: 2021-02-11 | Disposition: A | Discharge status: Home / Self Care | Payer: 59 | Source: Ambulatory Visit | Attending: Family Medicine | Admitting: Family Medicine

## 2021-02-11 ENCOUNTER — Ambulatory Visit (INDEPENDENT_AMBULATORY_CARE_PROVIDER_SITE_OTHER): Payer: 59 | Admitting: Family Medicine

## 2021-02-11 ENCOUNTER — Other Ambulatory Visit: Payer: Self-pay

## 2021-02-11 VITALS — BP 152/84 | HR 99 | Temp 98.8°F | Resp 16 | Ht 69.0 in | Wt 196.1 lb

## 2021-02-11 DIAGNOSIS — R809 Proteinuria, unspecified: Secondary | ICD-10-CM

## 2021-02-11 DIAGNOSIS — Z23 Encounter for immunization: Secondary | ICD-10-CM

## 2021-02-11 DIAGNOSIS — G43009 Migraine without aura, not intractable, without status migrainosus: Secondary | ICD-10-CM

## 2021-02-11 DIAGNOSIS — F330 Major depressive disorder, recurrent, mild: Secondary | ICD-10-CM

## 2021-02-11 DIAGNOSIS — R079 Chest pain, unspecified: Secondary | ICD-10-CM

## 2021-02-11 DIAGNOSIS — E1129 Type 2 diabetes mellitus with other diabetic kidney complication: Secondary | ICD-10-CM | POA: Diagnosis not present

## 2021-02-11 DIAGNOSIS — D649 Anemia, unspecified: Secondary | ICD-10-CM

## 2021-02-11 DIAGNOSIS — F4321 Adjustment disorder with depressed mood: Secondary | ICD-10-CM

## 2021-02-11 DIAGNOSIS — E1159 Type 2 diabetes mellitus with other circulatory complications: Secondary | ICD-10-CM

## 2021-02-11 DIAGNOSIS — I152 Hypertension secondary to endocrine disorders: Secondary | ICD-10-CM

## 2021-02-11 DIAGNOSIS — Z1231 Encounter for screening mammogram for malignant neoplasm of breast: Secondary | ICD-10-CM | POA: Insufficient documentation

## 2021-02-11 DIAGNOSIS — R1310 Dysphagia, unspecified: Secondary | ICD-10-CM

## 2021-02-11 DIAGNOSIS — E1169 Type 2 diabetes mellitus with other specified complication: Secondary | ICD-10-CM

## 2021-02-11 DIAGNOSIS — Z1211 Encounter for screening for malignant neoplasm of colon: Secondary | ICD-10-CM

## 2021-02-11 DIAGNOSIS — E113491 Type 2 diabetes mellitus with severe nonproliferative diabetic retinopathy without macular edema, right eye: Secondary | ICD-10-CM

## 2021-02-11 DIAGNOSIS — E785 Hyperlipidemia, unspecified: Secondary | ICD-10-CM

## 2021-02-11 DIAGNOSIS — I10 Essential (primary) hypertension: Secondary | ICD-10-CM | POA: Diagnosis not present

## 2021-02-11 MED ORDER — DULOXETINE HCL 60 MG PO CPEP: 60.0000 mg | ORAL_CAPSULE | Freq: Every day | ORAL | 0 refills | Status: DC

## 2021-02-11 MED ORDER — ATORVASTATIN CALCIUM 40 MG PO TABS: 40.0000 mg | ORAL_TABLET | Freq: Every day | ORAL | 1 refills | Status: DC

## 2021-02-11 MED ORDER — EMPAGLIFLOZIN 10 MG PO TABS: 10.0000 mg | ORAL_TABLET | Freq: Every day | ORAL | 2 refills | Status: DC

## 2021-02-11 MED ORDER — OLMESARTAN MEDOXOMIL-HCTZ 20-12.5 MG PO TABS: 1.0000 | ORAL_TABLET | Freq: Every day | ORAL | 1 refills | Status: DC

## 2021-02-11 MED ORDER — SUMATRIPTAN SUCCINATE 100 MG PO TABS: 100.0000 mg | ORAL_TABLET | ORAL | 1 refills | Status: DC | PRN

## 2021-02-11 MED ORDER — FREESTYLE LIBRE 14 DAY SENSOR MISC: 1.0000 | Freq: Four times a day (QID) | 2 refills | Status: DC

## 2021-02-11 MED ORDER — ARIPIPRAZOLE 2 MG PO TABS: 2.0000 mg | ORAL_TABLET | Freq: Every evening | ORAL | 0 refills | Status: DC

## 2021-02-11 MED ORDER — XULTOPHY 100-3.6 UNIT-MG/ML ~~LOC~~ SOPN: 16.0000 [IU] | PEN_INJECTOR | Freq: Every day | SUBCUTANEOUS | 2 refills | Status: DC

## 2021-02-11 NOTE — Patient Instructions (Signed)
Check glucose every am and go up by 2 units every 3 days on dose of Xultophy until glucose stays between 100-140

## 2021-02-12 LAB — LIPID PANEL
Cholesterol: 297 mg/dL — ABNORMAL HIGH (ref <200)
HDL: 48 mg/dL — ABNORMAL LOW (ref >=50)
LDL Cholesterol (Calc): 229 mg/dL (calc) — ABNORMAL HIGH
Non-HDL Cholesterol (Calc): 249 mg/dL (calc) — ABNORMAL HIGH (ref <130)
Total CHOL/HDL Ratio: 6.2 (calc) — ABNORMAL HIGH (ref <5.0)
Triglycerides: 86 mg/dL (ref <150)

## 2021-02-12 LAB — CBC WITH DIFFERENTIAL/PLATELET
Absolute Monocytes: 439 cells/uL (ref 200–950)
Basophils Absolute: 46 cells/uL (ref 0–200)
Basophils Relative: 0.6 %
Eosinophils Absolute: 123 cells/uL (ref 15–500)
Eosinophils Relative: 1.6 %
HCT: 31.5 % — ABNORMAL LOW (ref 35.0–45.0)
Hemoglobin: 10.6 g/dL — ABNORMAL LOW (ref 11.7–15.5)
Lymphs Abs: 1548 cells/uL (ref 850–3900)
MCH: 29.2 pg (ref 27.0–33.0)
MCHC: 33.7 g/dL (ref 32.0–36.0)
MCV: 86.8 fL (ref 80.0–100.0)
MPV: 9.9 fL (ref 7.5–12.5)
Monocytes Relative: 5.7 %
Neutro Abs: 5544 cells/uL (ref 1500–7800)
Neutrophils Relative %: 72 %
Platelets: 262 10*3/uL (ref 140–400)
RBC: 3.63 10*6/uL — ABNORMAL LOW (ref 3.80–5.10)
RDW: 12.5 % (ref 11.0–15.0)
Total Lymphocyte: 20.1 %
WBC: 7.7 10*3/uL (ref 3.8–10.8)

## 2021-02-12 LAB — COMPLETE METABOLIC PANEL WITH GFR
AG Ratio: 1.2 (calc) (ref 1.0–2.5)
ALT: 12 U/L (ref 6–29)
AST: 12 U/L (ref 10–35)
Albumin: 3.4 g/dL — ABNORMAL LOW (ref 3.6–5.1)
Alkaline phosphatase (APISO): 65 U/L (ref 37–153)
BUN: 15 mg/dL (ref 7–25)
CO2: 25 mmol/L (ref 20–32)
Calcium: 8.9 mg/dL (ref 8.6–10.4)
Chloride: 103 mmol/L (ref 98–110)
Creat: 0.98 mg/dL (ref 0.50–1.05)
GFR, Est African American: 77 mL/min/{1.73_m2} (ref >=60)
GFR, Est Non African American: 67 mL/min/{1.73_m2} (ref >=60)
Globulin: 2.8 g/dL (calc) (ref 1.9–3.7)
Glucose, Bld: 360 mg/dL — ABNORMAL HIGH (ref 65–99)
Potassium: 4 mmol/L (ref 3.5–5.3)
Sodium: 136 mmol/L (ref 135–146)
Total Bilirubin: 0.7 mg/dL (ref 0.2–1.2)
Total Protein: 6.2 g/dL (ref 6.1–8.1)

## 2021-02-12 LAB — IRON,TIBC AND FERRITIN PANEL
%SAT: 13 % (calc) — ABNORMAL LOW (ref 16–45)
Ferritin: 197 ng/mL (ref 16–232)
Iron: 41 ug/dL — ABNORMAL LOW (ref 45–160)
TIBC: 317 mcg/dL (calc) (ref 250–450)

## 2021-02-12 LAB — MICROALBUMIN / CREATININE URINE RATIO
Creatinine, Urine: 34 mg/dL (ref 20–275)
Microalb Creat Ratio: 3924 mcg/mg creat — ABNORMAL HIGH (ref <30)
Microalb, Ur: 133.4 mg/dL

## 2021-02-12 LAB — HEMOGLOBIN A1C
Hgb A1c MFr Bld: 11.4 % of total Hgb — ABNORMAL HIGH (ref <5.7)
Mean Plasma Glucose: 280 mg/dL
eAG (mmol/L): 15.5 mmol/L

## 2021-02-13 ENCOUNTER — Other Ambulatory Visit: Payer: Self-pay | Admitting: Family Medicine

## 2021-02-13 ENCOUNTER — Ambulatory Visit: Payer: Self-pay | Admitting: *Deleted

## 2021-02-13 NOTE — Progress Notes (Signed)
Name: Sarah Duffy   MRN: [id number]    DOB: 20-Jun-1971   Date:02/17/2021       Progress Note  Subjective  Chief Complaint  Chest Congestion  HPI  Lower URI: she developed acute onset of SOB, chest congestion and fatigue last Thursday, went to Urgent Care March 4 th, 2022, had CXR but unable to see the result, had some labs, Glucose was 298, mild anemia. She was given Zpack, prednisone and cough suppressant medication . Last prednisone dose was this morning. Glucose in our office was 135. Explained importance of checking glucose more often when she takes prednisone. She states she is out of inhaler and has noticed some nocturnal wheezing, but over all feeling much better today COVID-19 test at home was negative  Patient Active Problem List   Diagnosis Date Noted  . Osteomyelitis of foot, left, acute (HCC) 06/04/2020  . Depression, major, recurrent, mild (HCC) 09/25/2019  . History of 2019 novel coronavirus disease (COVID-19) 06/23/2019  . Elevated blood pressure reading without diagnosis of hypertension 03/03/2018  . Migraine headache without aura 01/14/2018  . 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(HCC) 05/22/2017  . Diabetic retinopathy of left eye, with macular edema, with severe nonproliferative retinopathy, associated with type 2 diabetes mellitus 05/21/2016  . 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 Hyperlipidemia 03/10/2016  . MI (mitral incompetence) 02/25/2016  . Insomnia 02/25/2016  . Uncontrolled type 2 diabetes mellitus with foot ulcer (HCC) 02/03/2016  . Anxiety 09/04/2014    Past Surgical History:  Procedure Laterality Date  . CHOLECYSTECTOMY  1999  . TOE SURGERY Left 02/07/2016   Pinky Toe    Family History  Problem Relation Age of Onset  . Diabetes Mother   . Ulcers Mother   . Heart disease Father   . AAA (abdominal aortic aneurysm) Father   . Diabetes  Father   . Hypertension Father   . Stroke Father   . Alzheimer's disease Father   . Heart attack Sister   . Seizures Brother   . Diabetes Maternal Grandmother   . Breast cancer Maternal Grandmother     Social History   Tobacco Use  . Smoking status: Never Smoker  . Smokeless tobacco: Never Used  Substance Use Topics  . Alcohol use: No    Alcohol/week: 0.0 standard drinks     Current Outpatient Medications:  .  ARIPiprazole (ABILIFY) 2 MG tablet, Take 1 tablet (2 mg total) by mouth every evening., Disp: 90 tablet, Rfl: 0 .  atorvastatin (LIPITOR) 40 MG tablet, Take 1 tablet (40 mg total) by mouth daily., Disp: 90 tablet, Rfl: 1 .  azithromycin (ZITHROMAX) 250 MG tablet, Take by mouth., Disp: , Rfl:  .  chlorpheniramine-HYDROcodone (TUSSIONEX) 10-8 MG/5ML SUER, SMARTSIG:5 Milliliter(s) By Mouth Every 12 Hours PRN, Disp: , Rfl:  .  Continuous Blood Gluc Sensor (FREESTYLE LIBRE 14 DAY SENSOR) MISC, 1 each by Does not apply route 4 (four) times daily., Disp: 2 each, Rfl: 2 .  DULoxetine (CYMBALTA) 60 MG capsule, Take 1 capsule (60 mg total) by mouth daily. 30 first week after that 2 daily, Disp: 90 capsule, Rfl: 0 .  empagliflozin (JARDIANCE) 10 MG TABS tablet, Take 1 tablet (10 mg total) by mouth daily., Disp: 30 tablet, Rfl: 2 .  Insulin Degludec-Liraglutide (XULTOPHY) 100-3.6 UNIT-MG/ML SOPN, Inject 16-50 Units into the skin daily., Disp: 15 mL, Rfl: 2 .  Multiple Vitamin (MULTIVITAMIN) capsule, Take by mouth., Disp: ,  Rfl:  .  Multiple Vitamins-Minerals (MULTIVITAMIN WITH MINERALS) tablet, Take 1 tablet by mouth daily., Disp: 100 tablet, Rfl: 0 .  olmesartan-hydrochlorothiazide (BENICAR HCT) 20-12.5 MG tablet, Take 1 tablet by mouth daily., Disp: 90 tablet, Rfl: 1 .  predniSONE (DELTASONE) 20 MG tablet, Take 20 mg by mouth daily., Disp: , Rfl:  .  SUMAtriptan (IMITREX) 100 MG tablet, Take 1 tablet (100 mg total) by mouth every 2 (two) hours as needed for migraine. Max of 2 in 24  hours, Disp: 10 tablet, Rfl: 1  No Known Allergies  I personally reviewed active problem list, medication list, allergies, family history, social history, health maintenance with the patient/caregiver today.   ROS  Constitutional: Negative for fever and positive for  weight change.  Respiratory: Positive  for cough, wheezing  and shortness of breath.   Cardiovascular: Negative for chest pain or palpitations.  Gastrointestinal: Negative for abdominal pain, no bowel changes.  Musculoskeletal: Negative for gait problem or joint swelling.  Skin: Negative for rash.  Neurological: Negative for dizziness or headache.  No other specific complaints in a complete review of systems (except as listed in HPI above).  Objective  Vitals:   02/17/21 1044  BP: 138/78  Pulse: 97  Resp: 16  Temp: 98.3 F (36.8 C)  TempSrc: Oral  SpO2: 100%  Weight: 189 lb 11.2 oz (86 kg)  Height: 5\' 9"  (1.753 m)    Body mass index is 28.01 kg/m.  Physical Exam  Constitutional: Patient appears well-developed and well-nourished.  No distress.  HEENT: head atraumatic, normocephalic, pupils equal and reactive to light, ears normal TM bilaterally,  neck supple Cardiovascular: Normal rate, regular rhythm and normal heart sounds.  No murmur heard. No BLE edema. Pulmonary/Chest: Effort normal and breath sounds normal. No respiratory distress. Abdominal: Soft.  There is no tenderness. Psychiatric: Patient has a normal mood and affect. behavior is normal. Judgment and thought content normal.  Recent Results (from the past 2160 hour(s))  Lipid panel     Status: Abnormal   Collection Time: 02/11/21  2:33 PM  Result Value Ref Range   Cholesterol 297 (H) <200 mg/dL   HDL 48 (L) > OR = 50 mg/dL   Triglycerides 86 04/13/21 mg/dL   LDL Cholesterol (Calc) 229 (H) mg/dL (calc)    Comment: LDL-C levels > or = 190 mg/dL may indicate familial  hypercholesterolemia (FH). Clinical assessment and  measurement of blood lipid  levels should be  considered for all first degree relatives of  patients with an FH diagnosis.  For questions about testing for familial hypercholesterolemia, please call <010 at Engineer, [email or web address]. [employer], et al. J National Lipid Association  Recommendations for Patient-Centered Management of  Dyslipidemia: Part 1 Journal of Clinical Lipidology  2015;9(2), 129-169. Reference range: <100 . Desirable range <100 mg/dL for primary prevention;   <70 mg/dL for patients with CHD or diabetic patients  with > or = 2 CHD risk factors. 03-31-2007 LDL-C is now calculated using the Martin-Hopkins  calculation, which is a validated novel method providing  better accuracy than the Friedewald equation in the  estimation of LDL-C.  Marland Kitchen et al. Horald Pollen. Lenox Ahr): 2061-2068  (http://education.[facility].com/faq/FAQ164)    Total CHOL/HDL Ratio 6.2 (H) <5.0 (calc)   Non-HDL Cholesterol (Calc) 249 (H) <130 mg/dL (calc)    Comment: Non-HDL level > or = 220 is very high and may indicate  genetic familial hypercholesterolemia (FH). Clinical  assessment and measurement of blood lipid levels  should be  considered for all first-degree relatives  of patients with an FH diagnosis. . For patients with diabetes plus 1 major ASCVD risk  factor, treating to a non-HDL-C goal of <100 mg/dL  (LDL-C of <40 mg/dL) is considered a therapeutic  option.   Microalbumin / creatinine urine ratio     Status: Abnormal   Collection Time: 02/11/21  2:33 PM  Result Value Ref Range   Creatinine, Urine 34 20 - 275 mg/dL   Microalb, Ur 981.1 mg/dL    Comment: Verified by repeat analysis. Marland Kitchen Reference Range Not established    Microalb Creat Ratio 3,924 (H) <30 mcg/mg creat    Comment: . The ADA defines abnormalities in albumin excretion as follows: Marland Kitchen Albuminuria Category        Result (mcg/mg creatinine) . Normal to Mildly increased   <30 Moderately increased         30-299  Severely  increased           > OR = 300 . The ADA recommends that at least two of three specimens collected within a 3-6 month period be abnormal before considering a patient to be within a diagnostic category.   Hemoglobin A1c     Status: Abnormal   Collection Time: 02/11/21  2:33 PM  Result Value Ref Range   Hgb A1c MFr Bld 11.4 (H) <5.7 % of total Hgb    Comment: For someone without known diabetes, a hemoglobin A1c value of 6.5% or greater indicates that they may have  diabetes and this should be confirmed with a follow-up  test. . For someone with known diabetes, a value <7% indicates  that their diabetes is well controlled and a value  greater than or equal to 7% indicates suboptimal  control. A1c targets should be individualized based on  duration of diabetes, age, comorbid conditions, and  other considerations. . Currently, no consensus exists regarding use of hemoglobin A1c for diagnosis of diabetes for children. .    Mean Plasma Glucose 280 mg/dL   eAG (mmol/L) 91.4 mmol/L  COMPLETE METABOLIC PANEL WITH GFR     Status: Abnormal   Collection Time: 02/11/21  2:33 PM  Result Value Ref Range   Glucose, Bld 360 (H) 65 - 99 mg/dL    Comment: .            Fasting reference interval . For someone without known diabetes, a glucose value >125 mg/dL indicates that they may have diabetes and this should be confirmed with a follow-up test. .    BUN 15 7 - 25 mg/dL   Creat 7.82 9.56 - 2.13 mg/dL    Comment: For patients >60 years of age, the reference limit for Creatinine is approximately 13% higher for people identified as African-American. .    GFR, Est Non African American 67 > OR = 60 mL/min/1.66m2   GFR, Est African American 77 > OR = 60 mL/min/1.57m2   BUN/Creatinine Ratio NOT APPLICABLE 6 - 22 (calc)   Sodium 136 135 - 146 mmol/L   Potassium 4.0 3.5 - 5.3 mmol/L   Chloride 103 98 - 110 mmol/L   CO2 25 20 - 32 mmol/L   Calcium 8.9 8.6 - 10.4 mg/dL   Total Protein 6.2  6.1 - 8.1 g/dL   Albumin 3.4 (L) 3.6 - 5.1 g/dL   Globulin 2.8 1.9 - 3.7 g/dL (calc)   AG Ratio 1.2 1.0 - 2.5 (calc)   Total Bilirubin 0.7 0.2 - 1.2 mg/dL   Alkaline phosphatase (  APISO) 65 37 - 153 U/L   AST 12 10 - 35 U/L   ALT 12 6 - 29 U/L  CBC with Differential/Platelet     Status: Abnormal   Collection Time: 02/11/21  2:33 PM  Result Value Ref Range   WBC 7.7 3.8 - 10.8 Thousand/uL   RBC 3.63 (L) 3.80 - 5.10 Million/uL   Hemoglobin 10.6 (L) 11.7 - 15.5 g/dL   HCT 16.131.5 (L) 09.635.0 - 04.545.0 %   MCV 86.8 80.0 - 100.0 fL   MCH 29.2 27.0 - 33.0 pg   MCHC 33.7 32.0 - 36.0 g/dL   RDW 40.912.5 81.111.0 - 91.415.0 %   Platelets 262 140 - 400 Thousand/uL   MPV 9.9 7.5 - 12.5 fL   Neutro Abs 5,544 1,500 - 7,800 cells/uL   Lymphs Abs 1,548 850 - 3,900 cells/uL   Absolute Monocytes 439 200 - 950 cells/uL   Eosinophils Absolute 123 15 - 500 cells/uL   Basophils Absolute 46 0 - 200 cells/uL   Neutrophils Relative % 72 %   Total Lymphocyte 20.1 %   Monocytes Relative 5.7 %   Eosinophils Relative 1.6 %   Basophils Relative 0.6 %  Iron, TIBC and Ferritin Panel     Status: Abnormal   Collection Time: 02/11/21  2:33 PM  Result Value Ref Range   Iron 41 (L) 45 - 160 mcg/dL   TIBC 782317 956250 - 213450 mcg/dL (calc)   %SAT 13 (L) 16 - 45 % (calc)   Ferritin 197 16 - 232 ng/mL      PHQ2/9: Depression screen [facility] 2/[facility] 02/17/2021 02/11/2021 10/26/2019 09/25/2019 08/02/2019  Decreased Interest 1 2 1 1  0  Down, Depressed, Hopeless 1 2 1  0 0  PHQ - 2 Score 2 4 2 1  0  Altered sleeping 1 3 0 3 0  Tired, decreased energy 1 3 3 2 1   Change in appetite 0 1 0 0 0  Feeling bad or failure about yourself  0 1 1 0 0  Trouble concentrating 1 1 1  0 0  Moving slowly or fidgety/restless 0 1 1 0 0  Suicidal thoughts 0 0 0 0 0  PHQ-9 Score 5 14 8 6 1   Difficult doing work/chores Somewhat difficult - Somewhat difficult Not difficult at all Not difficult at all  Some recent data might be hidden    phq 9 is positive   Fall  Risk: Fall Risk  02/17/2021 02/11/2021 09/25/2019 08/02/2019 06/23/2019  Falls in the past year? 0 1 0 0 0  Number falls in past yr: 0 0 0 0 0  Injury with Fall? 0 1 0 0 0  Follow up Falls prevention discussed - - Falls evaluation completed Falls evaluation completed     Functional Status Survey: Is the patient deaf or have difficulty hearing?: No Does the patient have difficulty seeing, even when wearing glasses/contacts?: No Does the patient have difficulty concentrating, remembering, or making decisions?: No Does the patient have difficulty walking or climbing stairs?: No Does the patient have difficulty dressing or bathing?: No Does the patient have difficulty doing errands alone such as visiting a doctor's office or shopping?: No   Assessment & Plan  1. 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(HCC)  - POCT Glucose (CBG)  2. Lower respiratory infection  - albuterol (VENTOLIN HFA) 108 (90 Base) MCG/ACT inhaler; Inhale 2 puffs into the lungs every 6 (six) hours as needed for wheezing or shortness of breath.  Dispense: 8 g; Refill: 0

## 2021-02-13 NOTE — Telephone Encounter (Signed)
Patient is having lung sounds/breathing changes- woke her from sleep last night- cough, wheezing- crackling sounds with exhale, sharp pains in center of chest. Patient sat up all night- treating her symptoms. Negative COVID home test. Patient states her symptoms have all resolved except for wheezing/mild SOB in chest- advised UC for evaluation.  Reason for Disposition . [1] MILD difficulty breathing (e.g., minimal/no SOB at rest, SOB with walking, pulse <100) AND [2] NEW-onset or WORSE than normal  Answer Assessment - Initial Assessment Questions 1. RESPIRATORY STATUS: "Describe your breathing?" (e.g., wheezing, shortness of breath, unable to speak, severe coughing)      Fell breathing ok- slightly SOB- no lung sounds today 2. ONSET: "When did this breathing problem begin?"      Last night 3. PATTERN "Does the difficult breathing come and go, or has it been constant since it started?"      Constant last night 4. SEVERITY: "How bad is your breathing?" (e.g., mild, moderate, severe)    - MILD: No SOB at rest, mild SOB with walking, speaks normally in sentences, can lay down, no retractions, pulse < 100.    - MODERATE: SOB at rest, SOB with minimal exertion and prefers to sit, cannot lie down flat, speaks in phrases, mild retractions, audible wheezing, pulse 100-120.    - SEVERE: Very SOB at rest, speaks in single words, struggling to breathe, sitting hunched forward, retractions, pulse > 120      mild 5. RECURRENT SYMPTOM: "Have you had difficulty breathing before?" If Yes, ask: "When was the last time?" and "What happened that time?"      Patient had hx pneumonia and feels like that  6. CARDIAC HISTORY: "Do you have any history of heart disease?" (e.g., heart attack, angina, bypass surgery, angioplasty)      no 7. LUNG HISTORY: "Do you have any history of lung disease?"  (e.g., pulmonary embolus, asthma, emphysema)     Hx pneumonia  8. CAUSE: "What do you think is causing the breathing  problem?"      Unsure- thought she may have swallowed wrong 9. OTHER SYMPTOMS: "Do you have any other symptoms? (e.g., dizziness, runny nose, cough, chest pain, fever)     no 10. PREGNANCY: "Is there any chance you are pregnant?" "When was your last menstrual period?"       n/a 11. TRAVEL: "Have you traveled out of the country in the last month?" (e.g., travel history, exposures)       no  Protocols used: BREATHING DIFFICULTY-A-AH

## 2021-02-14 ENCOUNTER — Other Ambulatory Visit: Payer: Self-pay

## 2021-02-14 NOTE — Telephone Encounter (Signed)
   Notes to clinic needs insurance approval

## 2021-02-17 ENCOUNTER — Ambulatory Visit (INDEPENDENT_AMBULATORY_CARE_PROVIDER_SITE_OTHER): Payer: 59 | Admitting: Family Medicine

## 2021-02-17 ENCOUNTER — Other Ambulatory Visit: Payer: Self-pay

## 2021-02-17 ENCOUNTER — Encounter: Payer: Self-pay | Admitting: Family Medicine

## 2021-02-17 VITALS — BP 138/78 | HR 97 | Temp 98.3°F | Resp 16 | Ht 69.0 in | Wt 189.7 lb

## 2021-02-17 DIAGNOSIS — R809 Proteinuria, unspecified: Secondary | ICD-10-CM | POA: Diagnosis not present

## 2021-02-17 DIAGNOSIS — E1129 Type 2 diabetes mellitus with other diabetic kidney complication: Secondary | ICD-10-CM | POA: Diagnosis not present

## 2021-02-17 DIAGNOSIS — J22 Unspecified acute lower respiratory infection: Secondary | ICD-10-CM

## 2021-02-17 LAB — GLUCOSE, POCT (MANUAL RESULT ENTRY): POC Glucose: 134 mg/dl — AB (ref 70–99)

## 2021-02-17 MED ORDER — ALBUTEROL SULFATE HFA 108 (90 BASE) MCG/ACT IN AERS: 2.0000 | INHALATION_SPRAY | Freq: Four times a day (QID) | RESPIRATORY_TRACT | 0 refills | Status: DC | PRN

## 2021-02-17 NOTE — Telephone Encounter (Signed)
Attempted PA.

## 2021-02-21 ENCOUNTER — Encounter: Payer: Self-pay | Admitting: Cardiology

## 2021-02-21 ENCOUNTER — Other Ambulatory Visit: Payer: Self-pay

## 2021-02-21 ENCOUNTER — Ambulatory Visit (INDEPENDENT_AMBULATORY_CARE_PROVIDER_SITE_OTHER): Payer: 59 | Admitting: Cardiology

## 2021-02-21 VITALS — BP 136/86 | HR 89 | Ht 69.0 in | Wt 190.0 lb

## 2021-02-21 DIAGNOSIS — E7800 Pure hypercholesterolemia, unspecified: Secondary | ICD-10-CM

## 2021-02-21 DIAGNOSIS — R079 Chest pain, unspecified: Secondary | ICD-10-CM | POA: Diagnosis not present

## 2021-02-21 DIAGNOSIS — I10 Essential (primary) hypertension: Secondary | ICD-10-CM

## 2021-02-21 NOTE — Progress Notes (Signed)
Cardiology Office Note:    Date:  02/21/2021   ID:  Sarah Duffy, DOB [birth year], MRN [id number]  PCP:  [name], MD   [facility]  Cardiologist:  [name], MD  Advanced Practice Provider:  No care team member to display Electrophysiologist:  None       Referring MD: [name], MD   Chief Complaint  Patient presents with  . New Patient (Initial Visit)    Referred by PCP for chest pain and HTN. Meds reviewed verbally with patient.    Sarah Duffy is a 50 y.o. female who is being seen today for the evaluation of chest pain at the request of [name], MD.   History of Present Illness:    Sarah Duffy is a 50 y.o. female with a hx of hypertension, hyperlipidemia who presents due to chest pain.  Patient had symptoms of chest discomfort to to 3 weeks ago not associated with exertion.  She had a gurgling mid chest discomfort which radiated to the left side.  The symptoms occurred over 2 to 3 days.  She attributes it to stress from just losing her mother.  Denies any symptoms prior to this and has not had any symptoms since.  Denies any chest pain or shortness of breath with exertion.  Denies palpitations.  Denies any history of heart disease.  Recent lipid panel earlier this month were abnormal.  She was started on Lipitor by primary care provider.  Past Medical History:  Diagnosis Date  . Diabetes mellitus without complication (HCC)   . MRSA infection within last 3 months 02/25/2016  . Osteomyelitis of foot (HCC) 08/26/2016    Past Surgical History:  Procedure Laterality Date  . CHOLECYSTECTOMY  1999  . TOE SURGERY Left 02/07/2016   Pinky Toe    Current Medications: Current Meds  Medication Sig  . albuterol (VENTOLIN HFA) 108 (90 Base) MCG/ACT inhaler Inhale 2 puffs into the lungs every 6 (six) hours as needed for wheezing or shortness of breath.  . ARIPiprazole (ABILIFY) 2 MG tablet Take 1 tablet (2 mg total) by mouth  every evening.  Marland Kitchen atorvastatin (LIPITOR) 40 MG tablet Take 1 tablet (40 mg total) by mouth daily.  . Continuous Blood Gluc Sensor (FREESTYLE LIBRE 14 DAY SENSOR) MISC 1 each by Does not apply route 4 (four) times daily.  . DULoxetine (CYMBALTA) 60 MG capsule Take 1 capsule (60 mg total) by mouth daily. 30 first week after that 2 daily  . empagliflozin (JARDIANCE) 10 MG TABS tablet Take 1 tablet (10 mg total) by mouth daily.  . Insulin Degludec-Liraglutide (XULTOPHY) 100-3.6 UNIT-MG/ML SOPN Inject 16-50 Units into the skin daily.  . Multiple Vitamins-Minerals (MULTIVITAMIN WITH MINERALS) tablet Take 1 tablet by mouth daily.  Marland Kitchen olmesartan-hydrochlorothiazide (BENICAR HCT) 20-12.5 MG tablet Take 1 tablet by mouth daily.  . SUMAtriptan (IMITREX) 100 MG tablet Take 1 tablet (100 mg total) by mouth every 2 (two) hours as needed for migraine. Max of 2 in 24 hours     Allergies:   Patient has no known allergies.   Social History   Socioeconomic History  . Marital status: Single    Spouse name: Not on file  . Number of children: 1  . Years of education: Not on file  . Highest education level: Some college, no degree  Occupational History  . Occupation: Dealer   Tobacco Use  . Smoking status: Never Smoker  . Smokeless tobacco: Never Used  Vaping Use  .  Vaping Use: Never used  Substance and Sexual Activity  . Alcohol use: No    Alcohol/week: 0.0 standard drinks  . Drug use: No  . Sexual activity: Yes    Partners: Male    Birth control/protection: Injection  Other Topics Concern  . Not on file  Social History Narrative   She used to work at [employer], but  Feb 2017 she left work because of  MRSA infection.osteomyelitis and uncontrolled DM. She has been back to work since Feb 2018   Lives alone   Social Determinants of Health   Financial Resource Strain: Not on file  Food Insecurity: Food Insecurity Present  . Worried About Programme researcher, broadcasting/film/video in the Last Year: Often  true  . Ran Out of Food in the Last Year: Often true  Transportation Needs: Unmet Transportation Needs  . Lack of Transportation (Medical): Yes  . Lack of Transportation (Non-Medical): Yes  Physical Activity: Not on file  Stress: Stress Concern Present  . Feeling of Stress : Very much  Social Connections: Unknown  . Frequency of Communication with Friends and Family: More than three times a week  . Frequency of Social Gatherings with Friends and Family: More than three times a week  . Attends Religious Services: Not on file  . Active Member of Clubs or Organizations: Not on file  . Attends Banker Meetings: Not on file  . Marital Status: Not on file     Family History: The patient's family history includes AAA (abdominal aortic aneurysm) in her father; Alzheimer's disease in her father; Breast cancer in her maternal grandmother; Diabetes in her father, maternal grandmother, and mother; Heart attack in her sister; Heart disease in her father; Hypertension in her father; Seizures in her brother; Stroke in her father; Ulcers in her mother.  ROS:   Please see the history of present illness.     All other systems reviewed and are negative.  EKGs/Labs/Other Studies Reviewed:    The following studies were reviewed today:   EKG:  EKG is  ordered today.  The ekg ordered today demonstrates normal sinus rhythm  Recent Labs: 02/11/2021: ALT 12; BUN 15; Creat 0.98; Hemoglobin 10.6; Platelets 262; Potassium 4.0; Sodium 136  Recent Lipid Panel    Component Value Date/Time   CHOL 297 (H) 02/11/2021 1433   CHOL 208 (H) 02/28/2016 0951   TRIG 86 02/11/2021 1433   HDL 48 (L) 02/11/2021 1433   HDL 42 02/28/2016 0951   CHOLHDL 6.2 (H) 02/11/2021 1433   LDLCALC 229 (H) 02/11/2021 1433     Risk Assessment/Calculations:      Physical Exam:    VS:  BP 136/86 (BP Location: Left Arm, Patient Position: Sitting, Cuff Size: Normal)   Pulse 89   Ht 5\' 9"  (1.753 m)   Wt 190 lb (86.2  kg)   LMP  (LMP Unknown)   SpO2 98%   BMI 28.06 kg/m     Wt Readings from Last 3 Encounters:  02/21/21 190 lb (86.2 kg)  02/17/21 189 lb 11.2 oz (86 kg)  02/11/21 196 lb 1.6 oz (89 kg)     GEN:  Well nourished, well developed in no acute distress HEENT: Normal NECK: No JVD; No carotid bruits LYMPHATICS: No lymphadenopathy CARDIAC: RRR, no murmurs, rubs, gallops RESPIRATORY:  Clear to auscultation without rales, wheezing or rhonchi  ABDOMEN: Soft, non-tender, non-distended MUSCULOSKELETAL:  No edema; No deformity  SKIN: Warm and dry NEUROLOGIC:  Alert and oriented x 3  PSYCHIATRIC:  Normal affect   ASSESSMENT:    1. Chest pain of uncertain etiology   2. Primary hypertension   3. Pure hypercholesterolemia    PLAN:    In order of problems listed above:  1. Chest pain, likely not of cardiac origin.  Currently resolved.  Get echocardiogram to evaluate overall cardiac function.  If symptoms return, we will consider additional testing such as a coronary CTA. 2. Hypertension, BP controlled.  Continue Benicar, HCTZ. 3. Hyperlipidemia, agree with starting Lipitor.  Repeat fasting lipid profile prior to follow-up visit.  Follow-up in 6 months.    Medication Adjustments/Labs and Tests Ordered: Current medicines are reviewed at length with the patient today.  Concerns regarding medicines are outlined above.  Orders Placed This Encounter  Procedures  . Lipid panel  . EKG 12-Lead  . ECHOCARDIOGRAM COMPLETE   No orders of the defined types were placed in this encounter.   Patient Instructions  Medication Instructions:  Your physician recommends that you continue on your current medications as directed. Please refer to the Current Medication list given to you today.  *If you need a refill on your cardiac medications before your next appointment, please call your pharmacy*   Lab Work:  Your physician recommends that you return for a FASTING lipid profile: just prior to your  follow up in 6 months.  - You will need to be fasting. Please do not have anything to eat or drink after midnight the morning you have the lab work. You may only have water or black coffee with no cream or sugar. - Please go to the [facility]. You will check in at the front desk to the right as you walk into the atrium. Valet Parking is offered if needed. - No appointment needed. You may go any day between 7 am and 6 pm.     Testing/Procedures:  Your physician has requested that you have an echocardiogram. Echocardiography is a painless test that uses sound waves to create images of your heart. It provides your doctor with information about the size and shape of your heart and how well your heart's chambers and valves are working. This procedure takes approximately one hour. There are no restrictions for this procedure.     Follow-Up: At [facility], you and your health needs are our priority.  As part of our continuing mission to provide you with exceptional heart care, we have created designated Provider Care Teams.  These Care Teams include your primary Cardiologist (physician) and Advanced Practice Providers (APPs -  Physician Assistants and Nurse Practitioners) who all work together to provide you with the care you need, when you need it.  We recommend signing up for the patient portal called "MyChart".  Sign up information is provided on this After Visit Summary.  MyChart is used to connect with patients for Virtual Visits (Telemedicine).  Patients are able to view lab/test results, encounter notes, upcoming appointments, etc.  Non-urgent messages can be sent to your provider as well.   To learn more about what you can do with MyChart, go to [email or web address].    Your next appointment:   6 month(s)  The format for your next appointment:   In Person  Provider:   [name], MD   Other Instructions      Signed, [name], MD  02/21/2021 12:30  PM    [facility]

## 2021-02-21 NOTE — Patient Instructions (Signed)
Medication Instructions:  Your physician recommends that you continue on your current medications as directed. Please refer to the Current Medication list given to you today.  *If you need a refill on your cardiac medications before your next appointment, please call your pharmacy*   Lab Work:  Your physician recommends that you return for a FASTING lipid profile: just prior to your follow up in 6 months.  - You will need to be fasting. Please do not have anything to eat or drink after midnight the morning you have the lab work. You may only have water or black coffee with no cream or sugar. - Please go to the [facility]. You will check in at the front desk to the right as you walk into the atrium. Valet Parking is offered if needed. - No appointment needed. You may go any day between 7 am and 6 pm.     Testing/Procedures:  Your physician has requested that you have an echocardiogram. Echocardiography is a painless test that uses sound waves to create images of your heart. It provides your doctor with information about the size and shape of your heart and how well your heart's chambers and valves are working. This procedure takes approximately one hour. There are no restrictions for this procedure.     Follow-Up: At [facility], you and your health needs are our priority.  As part of our continuing mission to provide you with exceptional heart care, we have created designated Provider Care Teams.  These Care Teams include your primary Cardiologist (physician) and Advanced Practice Providers (APPs -  Physician Assistants and Nurse Practitioners) who all work together to provide you with the care you need, when you need it.  We recommend signing up for the patient portal called "MyChart".  Sign up information is provided on this After Visit Summary.  MyChart is used to connect with patients for Virtual Visits (Telemedicine).  Patients are able to view lab/test results, encounter  notes, upcoming appointments, etc.  Non-urgent messages can be sent to your provider as well.   To learn more about what you can do with MyChart, go to [email or web address].    Your next appointment:   6 month(s)  The format for your next appointment:   In Person  Provider:   [name], MD   Other Instructions

## 2021-03-04 ENCOUNTER — Telehealth: Payer: Self-pay | Admitting: Family Medicine

## 2021-03-04 NOTE — Telephone Encounter (Signed)
Pt called in stating that she is dealing with sinus issues since last week. She is requesting to have PCP send over a Z pack to help. Please advise.      [id number] - [place], [place] - [address]  [address] [place] [address]  Phone: [phone] Fax: [phone]  Hours: Not open 24 hours

## 2021-03-04 NOTE — Telephone Encounter (Signed)
Tried to call pt to get her scheduled for 3/23 with Dr Linwood Dibbles for a virtual. VM not setup

## 2021-03-07 ENCOUNTER — Other Ambulatory Visit: Payer: Self-pay | Admitting: Family Medicine

## 2021-03-07 DIAGNOSIS — F330 Major depressive disorder, recurrent, mild: Secondary | ICD-10-CM

## 2021-03-07 NOTE — Telephone Encounter (Signed)
Requested medication (s) are due for refill today:   Yes  Requested medication (s) are on the active medication list:   Yes  Future visit scheduled:   Yes   Last ordered: 02/11/2021 #90, 0 refills  Clinic note:  Returned because pharmacy requesting a 90 day supply and needs a DX Code   Requested Prescriptions  Pending Prescriptions Disp Refills   DULoxetine (CYMBALTA) 60 MG capsule [Pharmacy Med Name: DULOXETINE HCL DR 60 MG CAP] 180 capsule 1    Sig: TAKE 1 CAPSULE (60 MG TOTAL) BY MOUTH DAILY FOR 1 WEEK, THEN INCREASE TO 2 CAPSULES BY MOUTH DAILY      Psychiatry: Antidepressants - SNRI Passed - 03/07/2021  8:31 AM      Passed - Completed PHQ-2 or PHQ-9 in the last 360 days      Passed - Last BP in normal range    BP Readings from Last 1 Encounters:  02/21/21 136/86          Passed - Valid encounter within last 6 months    Recent Outpatient Visits           2 weeks ago 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[facility])   [facility] [name], MD   3 weeks ago 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[facility])   [facility] [name], MD   1 year ago Depression, major, recurrent, 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[facility])   [facility] [name], MD   1 year ago 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

## 2021-03-11 ENCOUNTER — Other Ambulatory Visit: Payer: Self-pay | Admitting: Family Medicine

## 2021-03-11 DIAGNOSIS — J22 Unspecified acute lower respiratory infection: Secondary | ICD-10-CM

## 2021-03-11 NOTE — Telephone Encounter (Signed)
Requested medications are due for refill today Early  Requested medications are on the active medication list yes  Last refill 3/7  Last visit 3/7  Future visit scheduled yes, May 2022  Notes to clinic Asking for inhaler prior to refill time, please assess.

## 2021-03-12 ENCOUNTER — Other Ambulatory Visit: Payer: Self-pay

## 2021-03-12 ENCOUNTER — Encounter: Payer: Self-pay | Admitting: Gastroenterology

## 2021-03-12 ENCOUNTER — Ambulatory Visit (INDEPENDENT_AMBULATORY_CARE_PROVIDER_SITE_OTHER): Payer: 59

## 2021-03-12 ENCOUNTER — Ambulatory Visit (INDEPENDENT_AMBULATORY_CARE_PROVIDER_SITE_OTHER): Payer: 59 | Admitting: Gastroenterology

## 2021-03-12 VITALS — BP 139/86 | HR 89 | Ht 69.0 in | Wt 190.8 lb

## 2021-03-12 DIAGNOSIS — Z1211 Encounter for screening for malignant neoplasm of colon: Secondary | ICD-10-CM

## 2021-03-12 DIAGNOSIS — R079 Chest pain, unspecified: Secondary | ICD-10-CM

## 2021-03-12 DIAGNOSIS — R1310 Dysphagia, unspecified: Secondary | ICD-10-CM

## 2021-03-12 DIAGNOSIS — K5900 Constipation, unspecified: Secondary | ICD-10-CM | POA: Diagnosis not present

## 2021-03-12 MED ORDER — OMEPRAZOLE 40 MG PO CPDR: 40.0000 mg | DELAYED_RELEASE_CAPSULE | Freq: Every day | ORAL | 3 refills | Status: DC

## 2021-03-12 MED ORDER — PEG 3350-KCL-NA BICARB-NACL 420 G PO SOLR: 4000.0000 mL | Freq: Once | ORAL | 0 refills | Status: AC

## 2021-03-12 NOTE — Progress Notes (Signed)
[name] MD, MRCP(U.K) [address]  [address]  [place], [place] [address]  Main: [phone]  Fax: [phone]   Gastroenterology Consultation  Referring Provider:     [name], MD Primary Care Physician:  [name], MD Primary Gastroenterologist:  Dr. [name]  Reason for Consultation:    Dysphagia and colon cancer screening        HPI:   Sarah Duffy is a 50 y.o. y/o female referred for consultation & management  by Dr. Carlynn Purl, Danna Hefty, MD.    She has been referred for dysphagia and colon cancer screening.  She states that she was diagnosed with Covid last year and since then has been having some heartburn, gradual dysphagia for solids mostly and sometimes liquids which she feels getting stuck in the center of her throat.  She has taken Tums for heartburn with temporary relief.  She does complain of a early satiety and a sensation of food sitting in the stomach for a long period of time.  She has lost weight recently.  No prior colonoscopy no family history of colon cancer or polyps no recent change in bowel habits no rectal bleeding.  She does complain of longstanding constipation has not tried any agents.  Past Medical History:  Diagnosis Date  . Diabetes mellitus without complication (HCC)   . MRSA infection within last 3 months 02/25/2016  . Osteomyelitis of foot (HCC) 08/26/2016    Past Surgical History:  Procedure Laterality Date  . CHOLECYSTECTOMY  1999  . TOE SURGERY Left 02/07/2016   Pinky Toe    Prior to Admission medications   Medication Sig Start Date End Date Taking? Authorizing Provider  albuterol (VENTOLIN HFA) 108 (90 Base) MCG/ACT inhaler Inhale 2 puffs into the lungs every 6 (six) hours as needed for wheezing or shortness of breath. 02/17/21   [name], MD  ARIPiprazole (ABILIFY) 2 MG tablet Take 1 tablet (2 mg total) by mouth every evening. 02/11/21   [name], MD  atorvastatin (LIPITOR) 40 MG tablet Take 1 tablet (40  mg total) by mouth daily. 02/11/21   [name], MD  Continuous Blood Gluc Sensor (FREESTYLE LIBRE 14 DAY SENSOR) MISC 1 each by Does not apply route 4 (four) times daily. 02/11/21   [name], MD  DULoxetine (CYMBALTA) 60 MG capsule Take 1 capsule (60 mg total) by mouth daily. 03/07/21   [name], MD  empagliflozin (JARDIANCE) 10 MG TABS tablet Take 1 tablet (10 mg total) by mouth daily. 02/11/21   [name], MD  Insulin Degludec-Liraglutide (XULTOPHY) 100-3.6 UNIT-MG/ML SOPN Inject 16-50 Units into the skin daily. 02/11/21   [name], MD  Multiple Vitamins-Minerals (MULTIVITAMIN WITH MINERALS) tablet Take 1 tablet by mouth daily. 05/29/20   Federico Flake, MD  olmesartan-hydrochlorothiazide (BENICAR HCT) 20-12.5 MG tablet Take 1 tablet by mouth daily. 02/11/21   [name], MD  SUMAtriptan (IMITREX) 100 MG tablet Take 1 tablet (100 mg total) by mouth every 2 (two) hours as needed for migraine. Max of 2 in 24 hours 02/11/21   [name], MD    Family History  Problem Relation Age of Onset  . Diabetes Mother   . Ulcers Mother   . Heart disease Father   . AAA (abdominal aortic aneurysm) Father   . Diabetes Father   . Hypertension Father   . Stroke Father   . Alzheimer's disease Father   . Heart attack Sister   . Seizures Brother   . Diabetes Maternal Grandmother   .  Breast cancer Maternal Grandmother      Social History   Tobacco Use  . Smoking status: Never Smoker  . Smokeless tobacco: Never Used  Vaping Use  . Vaping Use: Never used  Substance Use Topics  . Alcohol use: No    Alcohol/week: 0.0 standard drinks  . Drug use: No    Allergies as of 03/12/2021  . (No Known Allergies)    Review of Systems:    All systems reviewed and negative except where noted in HPI.   Physical Exam:  There were no vitals taken for this visit. No LMP recorded. Patient has had an injection. Psych:  Alert and cooperative. Normal mood and affect. General:    Alert,  Well-developed, well-nourished, pleasant and cooperative in NAD Head:  Normocephalic and atraumatic. Eyes:  Sclera clear, no icterus.   Conjunctiva pink. Ears:  Normal auditory acuity. Lungs:  Respirations even and unlabored.  Clear throughout to auscultation.   No wheezes, crackles, or rhonchi. No acute distress. Heart:  Regular rate and rhythm; no murmurs, clicks, rubs, or gallops. Abdomen:  Normal bowel sounds.  No bruits.  Soft, non-tender and non-distended without masses, hepatosplenomegaly or hernias noted.  No guarding or rebound tenderness.   . Neurologic:  Alert and oriented x3;  grossly normal neurologically. Psych:  Alert and cooperative. Normal mood and affect.  Imaging Studies: MM 3D SCREEN BREAST BILATERAL  Result Date: 02/12/2021 CLINICAL DATA:  Screening. EXAM: DIGITAL SCREENING BILATERAL MAMMOGRAM WITH TOMOSYNTHESIS AND CAD TECHNIQUE: Bilateral screening digital craniocaudal and mediolateral oblique mammograms were obtained. Bilateral screening digital breast tomosynthesis was performed. The images were evaluated with computer-aided detection. COMPARISON:  Previous exam(s). ACR Breast Density Category c: The breast tissue is heterogeneously dense, which may obscure small masses. FINDINGS: There are no findings suspicious for malignancy. The images were evaluated with computer-aided detection. IMPRESSION: No mammographic evidence of malignancy. A result letter of this screening mammogram will be mailed directly to the patient. RECOMMENDATION: Screening mammogram in one year. (Code:SM-B-01Y) BI-RADS CATEGORY  1: Negative. Electronically Signed   By: Ted Mcalpine M.D.   On: 02/12/2021 15:06    Assessment and Plan:   Sarah Duffy is a 50 y.o. y/o female has been referred for dysphagia and colon cancer screening  Plan 1.  EGD and colonoscopy to evaluate for dysphagia and colon cancer screening 2.  Commence on Prilosec 40 mg once a day, with the HbA1c being 11.3.  She  could very likely be having underlying gastroparesis with associated reflux.  Suggest to have small meals more often than large meals at a time.  Avoid eating for 2 hours before bedtime.  Use a wedge pillow at night.  Patient information for acid reflux provided.  Gastroparesis diet also provided information about.  Advised to improve glycemic control which will help with the gastroparesis 3.  Constipation due to possible gastroparesis would avoid high-fiber diet.  Commence on MiraLAX 1 capful daily.  If does not work we will commence on Trulance next visit   I have discussed alternative options, risks & benefits,  which include, but are not limited to, bleeding, infection, perforation,respiratory complication & drug reaction.  The patient agrees with this plan & written consent will be obtained.    Follow up in 3 months  Dr [name] MD,MRCP(U.K)

## 2021-03-12 NOTE — Patient Instructions (Addendum)
Gastroesophageal Reflux Disease, Adult  Gastroesophageal reflux (GER) happens when acid from the stomach flows up into the tube that connects the mouth and the stomach (esophagus). Normally, food travels down the esophagus and stays in the stomach to be digested. With GER, food and stomach acid sometimes move back up into the esophagus. You may have a disease called gastroesophageal reflux disease (GERD) if the reflux:  Happens often.  Causes frequent or very bad symptoms.  Causes problems such as damage to the esophagus. When this happens, the esophagus becomes sore and swollen. Over time, GERD can make small holes (ulcers) in the lining of the esophagus. What are the causes? This condition is caused by a problem with the muscle between the esophagus and the stomach. When this muscle is weak or not normal, it does not close properly to keep food and acid from coming back up from the stomach. The muscle can be weak because of:  Tobacco use.  Pregnancy.  Having a certain type of hernia (hiatal hernia).  Alcohol use.  Certain foods and drinks, such as coffee, chocolate, onions, and peppermint. What increases the risk?  Being overweight.  Having a disease that affects your connective tissue.  Taking NSAIDs, such a ibuprofen. What are the signs or symptoms?  Heartburn.  Difficult or painful swallowing.  The feeling of having a lump in the throat.  A bitter taste in the mouth.  Bad breath.  Having a lot of saliva.  Having an upset or bloated stomach.  Burping.  Chest pain. Different conditions can cause chest pain. Make sure you see your doctor if you have chest pain.  Shortness of breath or wheezing.  A long-term cough or a cough at night.  Wearing away of the surface of teeth (tooth enamel).  Weight loss. How is this treated?  Making changes to your diet.  Taking medicine.  Having surgery. Treatment will depend on how bad your symptoms are. Follow these  instructions at home: Eating and drinking  Follow a diet as told by your doctor. You may need to avoid foods and drinks such as: ? Coffee and tea, with or without caffeine. ? Drinks that contain alcohol. ? Energy drinks and sports drinks. ? Bubbly (carbonated) drinks or sodas. ? Chocolate and cocoa. ? Peppermint and mint flavorings. ? Garlic and onions. ? Horseradish. ? Spicy and acidic foods. These include peppers, chili powder, curry powder, vinegar, hot sauces, and BBQ sauce. ? Citrus fruit juices and citrus fruits, such as oranges, lemons, and limes. ? Tomato-based foods. These include red sauce, chili, salsa, and pizza with red sauce. ? Fried and fatty foods. These include donuts, french fries, potato chips, and high-fat dressings. ? High-fat meats. These include hot dogs, rib eye steak, sausage, ham, and bacon. ? High-fat dairy items, such as whole milk, butter, and cream cheese.  Eat small meals often. Avoid eating large meals.  Avoid drinking large amounts of liquid with your meals.  Avoid eating meals during the 2-3 hours before bedtime.  Avoid lying down right after you eat.  Do not exercise right after you eat.   Lifestyle  Do not smoke or use any products that contain nicotine or tobacco. If you need help quitting, ask your doctor.  Try to lower your stress. If you need help doing this, ask your doctor.  If you are overweight, lose an amount of weight that is healthy for you. Ask your doctor about a safe weight loss goal.   General instructions    Pay attention to any changes in your symptoms.  Take over-the-counter and prescription medicines only as told by your doctor.  Do not take aspirin, ibuprofen, or other NSAIDs unless your doctor says it is okay.  Wear loose clothes. Do not wear anything tight around your waist.  Raise (elevate) the head of your bed about 6 inches (15 cm). You may need to use a wedge to do this.  Avoid bending over if this makes your  symptoms worse.  Keep all follow-up visits. Contact a doctor if:  You have new symptoms.  You lose weight and you do not know why.  You have trouble swallowing or it hurts to swallow.  You have wheezing or a cough that keeps happening.  You have a hoarse voice.  Your symptoms do not get better with treatment. Get help right away if:  You have sudden pain in your arms, neck, jaw, teeth, or back.  You suddenly feel sweaty, dizzy, or light-headed.  You have chest pain or shortness of breath.  You vomit and the vomit is green, yellow, or black, or it looks like blood or coffee grounds.  You faint.  Your poop (stool) is red, bloody, or black.  You cannot swallow, drink, or eat. These symptoms may represent a serious problem that is an emergency. Do not wait to see if the symptoms will go away. Get medical help right away. Call your local emergency services (911 in the [place] Do not drive yourself to the hospital. Summary  If a person has gastroesophageal reflux disease (GERD), food and stomach acid move back up into the esophagus and cause symptoms or problems such as damage to the esophagus.  Treatment will depend on how bad your symptoms are.  Follow a diet as told by your doctor.  Take all medicines only as told by your doctor. This information is not intended to replace advice given to you by your health care provider. Make sure you discuss any questions you have with your health care provider. Document Revised: 06/10/2020 Document Reviewed: 06/10/2020 Elsevier Patient Education  2021 [employer]  Gastroparesis  Gastroparesis is a condition in which food takes longer than normal to empty from the stomach. This condition is also known as delayed gastric emptying. It is usually a long-term (chronic) condition. There is no cure, but there are treatments and things that you can do at home to help relieve symptoms. Treating the underlying condition that causes  gastroparesis can also help relieve symptoms. What are the causes? In many cases, the cause of this condition is not known. Possible causes include:  A hormone (endocrine) disorder, such as hypothyroidism or diabetes.  A nervous system disease, such as Parkinson's disease or multiple sclerosis.  Cancer, infection, or surgery that affects the stomach or vagus nerve. The vagus nerve runs from your chest, through your neck, and to the lower part of your brain.  A connective tissue disorder, such as scleroderma.  Certain medicines. What increases the risk? You are more likely to develop this condition if:  You have certain disorders or diseases. These may include: ? An endocrine disorder. ? An eating disorder. ? Amyloidosis. ? Scleroderma. ? Parkinson's disease. ? Multiple sclerosis. ? Cancer or infection of the stomach or the vagus nerve.  You have had surgery on your stomach or vagus nerve.  You take certain medicines.  You are female. What are the signs or symptoms? Symptoms of this condition include:  Feeling full after eating very little or a  loss of appetite.  Nausea, vomiting, or heartburn.  Bloating of your abdomen.  Inconsistent blood sugar (glucose) levels on blood tests.  Unexplained weight loss.  Acid from the stomach coming up into the esophagus (gastroesophageal reflux).  Sudden tightening (spasm) of the stomach, which can be painful. Symptoms may come and go. Some people may not notice any symptoms. How is this diagnosed? This condition is diagnosed with tests, such as:  Tests that check how long it takes food to move through the stomach and intestines. These tests include: ? Upper gastrointestinal (GI) series. For this test, you drink a liquid that shows up well on X-rays, and then X-rays are taken of your intestines. ? Gastric emptying scintigraphy. For this test, you eat food that contains a small amount of radioactive material, and then scans are  taken. ? Wireless capsule GI monitoring system. For this test, you swallow a pill (capsule) that records information about how foods and fluid move through your stomach.  Gastric manometry. For this test, a tube is passed down your throat and into your stomach to measure electrical and muscular activity.  Endoscopy. For this test, a long, thin tube with a camera and light on the end is passed down your throat and into your stomach to check for problems in your stomach lining.  Ultrasound. This test uses sound waves to create images of the inside of your body. This can help rule out gallbladder disease or pancreatitis as a cause of your symptoms. How is this treated? There is no cure for this condition, but treatment and home care may relieve symptoms. Treatment may include:  Treating the underlying cause.  Managing your symptoms by making changes to your diet and exercise habits.  Taking medicines to control nausea and vomiting and to stimulate stomach muscles.  Getting food through a feeding tube in the hospital. This may be done in severe cases.  Having surgery to insert a device called a gastric electrical stimulator into your body. This device helps improve stomach emptying and control nausea and vomiting. Follow these instructions at home:  Take over-the-counter and prescription medicines only as told by your health care provider.  Follow instructions from your health care provider about eating or drinking restrictions. Your health care provider may recommend that you: ? Eat smaller meals more often. ? Eat low-fat foods. ? Eat low-fiber forms of high-fiber foods. For example, eat cooked vegetables instead of raw vegetables. ? Have only liquid foods instead of solid foods. Liquid foods are easier to digest.  Drink enough fluid to keep your urine pale yellow.  Exercise as often as told by your health care provider.  Keep all follow-up visits. This is important. Contact a health  care provider if you:  Notice that your symptoms do not improve with treatment.  Have new symptoms. Get help right away if you:  Have severe pain in your abdomen that does not improve with treatment.  Have nausea that is severe or does not go away.  Vomit every time you drink fluids. Summary  Gastroparesis is a long-term (chronic) condition in which food takes longer than normal to empty from the stomach.  Symptoms include nausea, vomiting, heartburn, bloating of your abdomen, and loss of appetite.  Eating smaller portions, low-fat foods, and low-fiber forms of high-fiber foods may help you manage your symptoms.  Get help right away if you have severe pain in your abdomen. This information is not intended to replace advice given to you by your health  care provider. Make sure you discuss any questions you have with your health care provider. Document Revised: 04/08/2020 Document Reviewed: 04/08/2020 Elsevier Patient Education  2021 [employer].

## 2021-03-13 LAB — ECHOCARDIOGRAM COMPLETE
AR max vel: 2.16 cm2
AV Area VTI: 2 cm2
AV Area mean vel: 2.05 cm2
AV Mean grad: 6 mmHg
AV Peak grad: 9.6 mmHg
Ao pk vel: 1.55 m/s
Area-P 1/2: 5.16 cm2
Calc EF: 40.7 %
S' Lateral: 4.7 cm
Single Plane A2C EF: 39.5 %
Single Plane A4C EF: 40.7 %

## 2021-03-19 ENCOUNTER — Telehealth: Payer: Self-pay

## 2021-03-19 NOTE — Telephone Encounter (Signed)
Called patient and gave her the below result note. Also scheduled her for a follow up on 04/11/21. Patient verbalized understanding and agreed with plan.  Please inform patient that echocardiogram shows moderately reduced ejection fraction.  We will need to evaluate presence of coronary artery disease.  This can be performed either with coronary CTA or heart catheterization.  Please schedule a follow-up appointment with myself to discuss echo results and ischemic work-up.

## 2021-03-19 NOTE — Telephone Encounter (Signed)
-----   Message from [name], MD sent at 03/18/2021  3:38 PM EDT ----- Please inform patient that echocardiogram shows moderately reduced ejection fraction.  We will need to evaluate presence of coronary artery disease.  This can be performed either with coronary CTA or heart catheterization.  Please schedule a follow-up appointment with myself to discuss echo results and ischemic work-up.  Thank you

## 2021-03-21 ENCOUNTER — Other Ambulatory Visit: Admission: RE | Admit: 2021-03-21 | Payer: 59 | Source: Ambulatory Visit

## 2021-03-25 ENCOUNTER — Ambulatory Visit: Payer: 59 | Admitting: Registered Nurse

## 2021-03-25 ENCOUNTER — Encounter
Admission: RE | Disposition: A | Discharge status: Home / Self Care | Payer: Self-pay | Source: Ambulatory Visit | Attending: Gastroenterology

## 2021-03-25 ENCOUNTER — Encounter: Payer: Self-pay | Admitting: Gastroenterology

## 2021-03-25 ENCOUNTER — Ambulatory Visit
Admission: RE | Admit: 2021-03-25 | Discharge: 2021-03-25 | Disposition: A | Discharge status: Home / Self Care | Payer: 59 | Source: Ambulatory Visit | Attending: Gastroenterology | Admitting: Gastroenterology

## 2021-03-25 DIAGNOSIS — Z1211 Encounter for screening for malignant neoplasm of colon: Secondary | ICD-10-CM | POA: Insufficient documentation

## 2021-03-25 DIAGNOSIS — Z79899 Other long term (current) drug therapy: Secondary | ICD-10-CM | POA: Insufficient documentation

## 2021-03-25 DIAGNOSIS — Z794 Long term (current) use of insulin: Secondary | ICD-10-CM | POA: Insufficient documentation

## 2021-03-25 DIAGNOSIS — Z7984 Long term (current) use of oral hypoglycemic drugs: Secondary | ICD-10-CM | POA: Diagnosis not present

## 2021-03-25 DIAGNOSIS — R1310 Dysphagia, unspecified: Secondary | ICD-10-CM | POA: Insufficient documentation

## 2021-03-25 HISTORY — PX: ESOPHAGOGASTRODUODENOSCOPY (EGD) WITH PROPOFOL: SHX5813

## 2021-03-25 HISTORY — PX: COLONOSCOPY WITH PROPOFOL: SHX5780

## 2021-03-25 LAB — GLUCOSE, CAPILLARY: Glucose-Capillary: 98 mg/dL (ref 70–99)

## 2021-03-25 LAB — POCT PREGNANCY, URINE: Preg Test, Ur: NEGATIVE

## 2021-03-25 SURGERY — COLONOSCOPY WITH PROPOFOL
Anesthesia: General

## 2021-03-25 MED ORDER — PROPOFOL 500 MG/50ML IV EMUL
INTRAVENOUS | Status: DC | PRN
  Administered 2021-03-25: 140 ug/kg/min via INTRAVENOUS

## 2021-03-25 MED ORDER — SODIUM CHLORIDE 0.9 % IV SOLN: INTRAVENOUS | Status: DC

## 2021-03-25 MED ORDER — PROPOFOL 10 MG/ML IV BOLUS
INTRAVENOUS | Status: DC | PRN
  Administered 2021-03-25: 70 mg via INTRAVENOUS

## 2021-03-25 MED ORDER — LIDOCAINE HCL (CARDIAC) PF 100 MG/5ML IV SOSY
PREFILLED_SYRINGE | INTRAVENOUS | Status: DC | PRN
  Administered 2021-03-25: 100 mg via INTRAVENOUS

## 2021-03-25 NOTE — Anesthesia Preprocedure Evaluation (Signed)
Anesthesia Evaluation  Patient identified by MRN, date of birth, ID band Patient awake    Reviewed: Allergy & Precautions, H&P , NPO status , Patient's Chart, lab work & pertinent test results, reviewed documented beta blocker date and time   History of Anesthesia Complications Negative for: history of anesthetic complications  Airway Mallampati: I  TM Distance: >3 FB Neck ROM: full    Dental  (+) Dental Advidsory Given, Missing, Teeth Intact   Pulmonary neg pulmonary ROS,    Pulmonary exam normal breath sounds clear to auscultation       Cardiovascular Exercise Tolerance: Good negative cardio ROS Normal cardiovascular exam Rhythm:regular Rate:Normal     Neuro/Psych PSYCHIATRIC DISORDERS Anxiety Depression negative neurological ROS     GI/Hepatic negative GI ROS, Neg liver ROS,   Endo/Other  diabetes  Renal/GU negative Renal ROS  negative genitourinary   Musculoskeletal   Abdominal   Peds  Hematology negative hematology ROS (+)   Anesthesia Other Findings Past Medical History: No date: Diabetes mellitus without complication (HCC) 02/25/2016: MRSA infection within last 3 months 08/26/2016: Osteomyelitis of foot (HCC)   Reproductive/Obstetrics negative OB ROS                             Anesthesia Physical Anesthesia Plan  ASA: II  Anesthesia Plan: General   Post-op Pain Management:    Induction: Intravenous  PONV Risk Score and Plan: 3 and TIVA and Propofol infusion  Airway Management Planned: Natural Airway and Nasal Cannula  Additional Equipment:   Intra-op Plan:   Post-operative Plan:   Informed Consent: I have reviewed the patients History and Physical, chart, labs and discussed the procedure including the risks, benefits and alternatives for the proposed anesthesia with the patient or authorized representative who has indicated his/her understanding and acceptance.      Dental Advisory Given  Plan Discussed with: Anesthesiologist, CRNA and Surgeon  Anesthesia Plan Comments:         Anesthesia Quick Evaluation

## 2021-03-25 NOTE — Anesthesia Postprocedure Evaluation (Signed)
Anesthesia Post Note  Patient: Sarah Duffy  Procedure(s) Performed: COLONOSCOPY WITH PROPOFOL (N/A ) ESOPHAGOGASTRODUODENOSCOPY (EGD) WITH PROPOFOL (N/A )  Patient location during evaluation: Endoscopy Anesthesia Type: General Level of consciousness: awake and alert Pain management: pain level controlled Vital Signs Assessment: post-procedure vital signs reviewed and stable Respiratory status: spontaneous breathing, nonlabored ventilation, respiratory function stable and patient connected to nasal cannula oxygen Cardiovascular status: blood pressure returned to baseline and stable Postop Assessment: no apparent nausea or vomiting Anesthetic complications: no   No complications documented.   Last Vitals:  Vitals:   03/25/21 1040 03/25/21 1100  BP: 117/71   Pulse:    Resp:  16  Temp: (!) 36.1 C   SpO2:      Last Pain:  Vitals:   03/25/21 1100  TempSrc:   PainSc: 0-No pain                 Lenard Simmer

## 2021-03-25 NOTE — Op Note (Signed)
[facility] Gastroenterology Patient Name: Sarah Duffy Procedure Date: 03/25/2021 9:54 AM MRN: [id number] Account #: [id number] Date of Birth: 07/17/1971 Admit Type: Outpatient Age: 50 Room: [facility] ENDO ROOM 2 Gender: Female Note Status: Finalized Procedure:             Colonoscopy Indications:           Screening for colorectal malignant neoplasm Providers:             [name] MD, MD Referring MD:          Onnie Boer. [name], MD (Referring MD) Medicines:             Monitored Anesthesia Care Complications:         No immediate complications. Procedure:             Pre-Anesthesia Assessment:                        - Prior to the procedure, a History and Physical was                         performed, and patient medications, allergies and                         sensitivities were reviewed. The patient's tolerance                         of previous anesthesia was reviewed.                        - The risks and benefits of the procedure and the                         sedation options and risks were discussed with the                         patient. All questions were answered and informed                         consent was obtained.                        - ASA Grade Assessment: II - A patient with mild                         systemic disease.                        After obtaining informed consent, the colonoscope was                         passed under direct vision. Throughout the procedure,                         the patient's blood pressure, pulse, and oxygen                         saturations were monitored continuously. The                         Colonoscope was introduced through the anus  and                         advanced to the the cecum, identified by the                         appendiceal orifice. The colonoscopy was performed                         with ease. The patient tolerated the procedure well.                         The  quality of the bowel preparation was poor. Findings:      The perianal and digital rectal examinations were normal.      A large amount of semi-liquid stool was found in the entire colon,       interfering with visualization. Impression:            - Preparation of the colon was poor.                        - Stool in the entire examined colon.                        - No specimens collected. Recommendation:        - Discharge patient to home (with escort).                        - Resume previous diet.                        - Continue present medications.                        - Repeat colonoscopy in 4 weeks because the bowel                         preparation was suboptimal. Procedure Code(s):     --- Professional ---                        [id number], Colonoscopy, flexible; diagnostic, including                         collection of specimen(s) by brushing or washing, when                         performed (separate procedure) Diagnosis Code(s):     --- Professional ---                        Z12.11, Encounter for screening for malignant neoplasm                         of colon CPT copyright 2019 American Medical Association. All rights reserved. The codes documented in this report are preliminary and upon coder review may  be revised to meet current compliance requirements. [name], MD [name] MD, MD 03/25/2021 10:38:53 AM This report has been signed electronically. Number of Addenda: 0 Note Initiated On: 03/25/2021 9:54 AM Scope Withdrawal Time: 0 hours 2 minutes 28 seconds  Total  Procedure Duration: 0 hours 7 minutes 46 seconds  Estimated Blood Loss:  Estimated blood loss: none.      [facility]

## 2021-03-25 NOTE — Op Note (Signed)
[facility] Gastroenterology Patient Name: Sarah Duffy Procedure Date: 03/25/2021 9:55 AM MRN: [id number] Account #: [id number] Date of Birth: Nov 05, 1971 Admit Type: Outpatient Age: 50 Room: [facility] ENDO ROOM 2 Gender: Female Note Status: Finalized Procedure:             Upper GI endoscopy Indications:           Dysphagia Providers:             [name] MD, MD Referring MD:          Onnie Boer. [name], MD (Referring MD) Medicines:             Monitored Anesthesia Care Complications:         No immediate complications. Procedure:             Pre-Anesthesia Assessment:                        - Prior to the procedure, a History and Physical was                         performed, and patient medications, allergies and                         sensitivities were reviewed. The patient's tolerance                         of previous anesthesia was reviewed.                        - The risks and benefits of the procedure and the                         sedation options and risks were discussed with the                         patient. All questions were answered and informed                         consent was obtained.                        - ASA Grade Assessment: II - A patient with mild                         systemic disease.                        After obtaining informed consent, the endoscope was                         passed under direct vision. Throughout the procedure,                         the patient's blood pressure, pulse, and oxygen                         saturations were monitored continuously. The Endoscope                         was introduced through the mouth, and advanced  to the                         third part of duodenum. The upper GI endoscopy was                         accomplished with ease. The patient tolerated the                         procedure well. Findings:      The stomach was normal.      The examined duodenum was  normal.      Normal mucosa was found in the entire esophagus. Biopsies were taken       with a cold forceps for histology.      The cardia and gastric fundus were normal on retroflexion. Impression:            - Normal stomach.                        - Normal examined duodenum.                        - Normal mucosa was found in the entire esophagus.                         Biopsied. Recommendation:        - Await pathology results.                        - Perform a colonoscopy today. Procedure Code(s):     --- Professional ---                        [phone], Esophagogastroduodenoscopy, flexible,                         transoral; with biopsy, single or multiple Diagnosis Code(s):     --- Professional ---                        R13.10, Dysphagia, unspecified CPT copyright 2019 American Medical Association. All rights reserved. The codes documented in this report are preliminary and upon coder review may  be revised to meet current compliance requirements. [name], MD [name] MD, MD 03/25/2021 10:27:37 AM This report has been signed electronically. Number of Addenda: 0 Note Initiated On: 03/25/2021 9:55 AM Estimated Blood Loss:  Estimated blood loss: none.      [facility]

## 2021-03-25 NOTE — Transfer of Care (Signed)
Immediate Anesthesia Transfer of Care Note  Patient: Sarah Duffy  Procedure(s) Performed: COLONOSCOPY WITH PROPOFOL (N/A ) ESOPHAGOGASTRODUODENOSCOPY (EGD) WITH PROPOFOL (N/A )  Patient Location: PACU  Anesthesia Type:General  Level of Consciousness: sedated  Airway & Oxygen Therapy: Patient Spontanous Breathing and Patient connected to nasal cannula oxygen  Post-op Assessment: Report given to RN and Post -op Vital signs reviewed and stable  Post vital signs: Reviewed and stable  Last Vitals:  Vitals Value Taken Time  BP 117/71 03/25/21 1041  Temp 36.1 C 03/25/21 1040  Pulse 82 03/25/21 1041  Resp 15 03/25/21 1041  SpO2 100 % 03/25/21 1041  Vitals shown include unvalidated device data.  Last Pain:  Vitals:   03/25/21 1040  TempSrc: Temporal  PainSc: Asleep         Complications: No complications documented.

## 2021-03-25 NOTE — H&P (Signed)
[name], MD [address], [place], [place], [address] [address], [address], [place], [place], [address] Phone: [phone]  Fax: [phone]  Primary Care Physician:  [name], MD   Pre-Procedure History & Physical: HPI:  Sarah Duffy is a 50 y.o. female is here for an endoscopy and colonoscopy    Past Medical History:  Diagnosis Date  . Diabetes mellitus without complication (HCC)   . MRSA infection within last 3 months 02/25/2016  . Osteomyelitis of foot (HCC) 08/26/2016    Past Surgical History:  Procedure Laterality Date  . CHOLECYSTECTOMY  1999  . TOE SURGERY Left 02/07/2016   Pinky Toe    Prior to Admission medications   Medication Sig Start Date End Date Taking? Authorizing Provider  atorvastatin (LIPITOR) 40 MG tablet Take 1 tablet (40 mg total) by mouth daily. 02/11/21  Yes [name], Danna Hefty, MD  empagliflozin (JARDIANCE) 10 MG TABS tablet Take 1 tablet (10 mg total) by mouth daily. 02/11/21  Yes [name], Danna Hefty, MD  Insulin Degludec-Liraglutide (XULTOPHY) 100-3.6 UNIT-MG/ML SOPN Inject 16-50 Units into the skin daily. 02/11/21  Yes [name], Danna Hefty, MD  olmesartan-hydrochlorothiazide (BENICAR HCT) 20-12.5 MG tablet Take 1 tablet by mouth daily. 02/11/21  Yes [name], Danna Hefty, MD  omeprazole (PRILOSEC) 40 MG capsule Take 1 capsule (40 mg total) by mouth daily. 03/12/21  Yes [name], MD  albuterol (VENTOLIN HFA) 108 (90 Base) MCG/ACT inhaler Inhale 2 puffs into the lungs every 6 (six) hours as needed for wheezing or shortness of breath. 02/17/21   [name], MD  ARIPiprazole (ABILIFY) 2 MG tablet Take 1 tablet (2 mg total) by mouth every evening. 02/11/21   [name], MD  Continuous Blood Gluc Sensor (FREESTYLE LIBRE 14 DAY SENSOR) MISC 1 each by Does not apply route 4 (four) times daily. 02/11/21   [name], MD  DULoxetine (CYMBALTA) 60 MG capsule Take 1 capsule (60 mg total) by mouth daily. 03/07/21   [name], MD  Multiple  Vitamins-Minerals (MULTIVITAMIN WITH MINERALS) tablet Take 1 tablet by mouth daily. 05/29/20   Federico Flake, MD  SUMAtriptan (IMITREX) 100 MG tablet Take 1 tablet (100 mg total) by mouth every 2 (two) hours as needed for migraine. Max of 2 in 24 hours 02/11/21   [name], MD    Allergies as of 03/12/2021  . (No Known Allergies)    Family History  Problem Relation Age of Onset  . Diabetes Mother   . Ulcers Mother   . Heart disease Father   . AAA (abdominal aortic aneurysm) Father   . Diabetes Father   . Hypertension Father   . Stroke Father   . Alzheimer's disease Father   . Heart attack Sister   . Seizures Brother   . Diabetes Maternal Grandmother   . Breast cancer Maternal Grandmother     Social History   Socioeconomic History  . Marital status: Single    Spouse name: Not on file  . Number of children: 1  . Years of education: Not on file  . Highest education level: Some college, no degree  Occupational History  . Occupation: Dealer   Tobacco Use  . Smoking status: Never Smoker  . Smokeless tobacco: Never Used  Vaping Use  . Vaping Use: Never used  Substance and Sexual Activity  . Alcohol use: No    Alcohol/week: 0.0 standard drinks  . Drug use: No  . Sexual activity: Yes    Partners: Male  Birth control/protection: Injection  Other Topics Concern  . Not on file  Social History Narrative   She used to work at [employer], but  Feb 2017 she left work because of  MRSA infection.osteomyelitis and uncontrolled DM. She has been back to work since Feb 2018   Lives alone   Social Determinants of Health   Financial Resource Strain: Not on file  Food Insecurity: Not on file  Transportation Needs: Not on file  Physical Activity: Not on file  Stress: Not on file  Social Connections: Not on file  Intimate Partner Violence: Not on file    Review of Systems: See HPI, otherwise negative ROS  Physical Exam: BP 117/71   Pulse (!) 102    Temp (!) 97 F (36.1 C) (Temporal)   Resp 16   Ht 5\' 9"  (1.753 m)   Wt 86.2 kg   SpO2 100%   BMI 28.06 kg/m  General:   Alert,  pleasant and cooperative in NAD Head:  Normocephalic and atraumatic. Neck:  Supple; no masses or thyromegaly. Lungs:  Clear throughout to auscultation, normal respiratory effort.    Heart:  +S1, +S2, Regular rate and rhythm, No edema. Abdomen:  Soft, nontender and nondistended. Normal bowel sounds, without guarding, and without rebound.   Neurologic:  Alert and  oriented x4;  grossly normal neurologically.  Impression/Plan: Sarah Duffy is here for an endoscopy and colonoscopy  to be performed for  evaluation of dysphagia and colon cancer screening     Risks, benefits, limitations, and alternatives regarding endoscopy have been reviewed with the patient.  Questions have been answered.  All parties agreeable.   [name], MD  03/25/2021, 12:25 PM

## 2021-03-26 ENCOUNTER — Encounter: Payer: Self-pay | Admitting: Gastroenterology

## 2021-03-26 LAB — SURGICAL PATHOLOGY

## 2021-04-04 ENCOUNTER — Telehealth: Payer: Self-pay | Admitting: Gastroenterology

## 2021-04-04 NOTE — Telephone Encounter (Signed)
Returned patients call. Unable to leave message. Sent a my chart message with dates available for reschedule.

## 2021-04-04 NOTE — Telephone Encounter (Signed)
Patient wants call back to reschedule her procedure.

## 2021-04-08 ENCOUNTER — Encounter: Payer: Self-pay | Admitting: Gastroenterology

## 2021-04-11 ENCOUNTER — Ambulatory Visit (INDEPENDENT_AMBULATORY_CARE_PROVIDER_SITE_OTHER): Payer: 59 | Admitting: Cardiology

## 2021-04-11 ENCOUNTER — Encounter: Payer: Self-pay | Admitting: Cardiology

## 2021-04-11 ENCOUNTER — Other Ambulatory Visit
Admission: RE | Admit: 2021-04-11 | Discharge: 2021-04-11 | Disposition: A | Discharge status: Home / Self Care | Payer: 59 | Source: Ambulatory Visit | Attending: Internal Medicine | Admitting: Internal Medicine

## 2021-04-11 ENCOUNTER — Telehealth: Payer: Self-pay

## 2021-04-11 ENCOUNTER — Other Ambulatory Visit: Payer: Self-pay

## 2021-04-11 VITALS — BP 140/66 | HR 90 | Ht 69.0 in | Wt 191.0 lb

## 2021-04-11 DIAGNOSIS — Z20822 Contact with and (suspected) exposure to covid-19: Secondary | ICD-10-CM | POA: Insufficient documentation

## 2021-04-11 DIAGNOSIS — E7800 Pure hypercholesterolemia, unspecified: Secondary | ICD-10-CM | POA: Diagnosis not present

## 2021-04-11 DIAGNOSIS — Z01812 Encounter for preprocedural laboratory examination: Secondary | ICD-10-CM | POA: Insufficient documentation

## 2021-04-11 DIAGNOSIS — I5020 Unspecified systolic (congestive) heart failure: Secondary | ICD-10-CM | POA: Diagnosis not present

## 2021-04-11 DIAGNOSIS — R1310 Dysphagia, unspecified: Secondary | ICD-10-CM

## 2021-04-11 DIAGNOSIS — Z1211 Encounter for screening for malignant neoplasm of colon: Secondary | ICD-10-CM

## 2021-04-11 DIAGNOSIS — I10 Essential (primary) hypertension: Secondary | ICD-10-CM

## 2021-04-11 LAB — SARS CORONAVIRUS 2 (TAT 6-24 HRS): SARS Coronavirus 2: NEGATIVE

## 2021-04-11 MED ORDER — CARVEDILOL 6.25 MG PO TABS: 6.2500 mg | ORAL_TABLET | Freq: Two times a day (BID) | ORAL | 5 refills | Status: DC

## 2021-04-11 MED ORDER — NA SULFATE-K SULFATE-MG SULF 17.5-3.13-1.6 GM/177ML PO SOLN: 1.0000 | Freq: Once | ORAL | 0 refills | Status: AC

## 2021-04-11 MED ORDER — ENTRESTO 24-26 MG PO TABS: 1.0000 | ORAL_TABLET | Freq: Two times a day (BID) | ORAL | 0 refills | Status: DC

## 2021-04-11 NOTE — H&P (View-Only) (Signed)
Cardiology Office Note:    Date:  04/11/2021   ID:  Sarah Duffy, DOB Aug 03, 1971, MRN [id number]  PCP:  Steele Sizer, MD   [place]  Cardiologist:  Kate Sable, MD  Advanced Practice Provider:  No care team member to display Electrophysiologist:  None       Referring MD: Steele Sizer, MD   Chief Complaint  Patient presents with  . Other    Follow up post ECHO. Meds reviewed verbally with patient.      History of Present Illness:    Sarah Duffy is a 50 y.o. female with a hx of hypertension, hyperlipidemia who presents for follow-up.  She was last seen due to chest pain.  Echocardiogram was ordered to evaluate cardiac function due to risk factors.  States symptoms of chest pains have resolved, denies shortness of breath, edema.  Tolerating Lipitor as prescribed.  Presents for echocardiogram results.  Past Medical History:  Diagnosis Date  . Diabetes mellitus without complication ([place])   . MRSA infection within last 3 months 02/25/2016  . Osteomyelitis of foot ([place]) 08/26/2016    Past Surgical History:  Procedure Laterality Date  . CHOLECYSTECTOMY  1999  . COLONOSCOPY WITH PROPOFOL N/A 03/25/2021   Procedure: COLONOSCOPY WITH PROPOFOL;  Surgeon: Jonathon Bellows, MD;  Location[facility] ENDOSCOPY;  Service: Gastroenterology;  Laterality: N/A;  . ESOPHAGOGASTRODUODENOSCOPY (EGD) WITH PROPOFOL N/A 03/25/2021   Procedure: ESOPHAGOGASTRODUODENOSCOPY (EGD) WITH PROPOFOL;  Surgeon: Jonathon Bellows, MD;  Location[facility]COPY;  Service: Gastroenterology;  Laterality: N/A;  . TOE SURGERY Left 02/07/2016   Pinky Toe    Current Medications: Current Meds  Medication Sig  . albuterol (VENTOLIN HFA) 108 (90 Base) MCG/ACT inhaler Inhale 2 puffs into the lungs every 6 (six) hours as needed for wheezing or shortness of breath.  Marland Kitchen atorvastatin (LIPITOR) 40 MG tablet Take 1 tablet (40 mg total) by mouth daily.  . carvedilol (COREG) 6.25 MG tablet Take 1 tablet  (6.25 mg total) by mouth 2 (two) times daily.  . empagliflozin (JARDIANCE) 10 MG TABS tablet Take 1 tablet (10 mg total) by mouth daily.  . Multiple Vitamins-Minerals (MULTIVITAMIN WITH MINERALS) tablet Take 1 tablet by mouth daily.  . Na Sulfate-K Sulfate-Mg Sulf 17.5-3.13-1.6 GM/177ML SOLN Take 1 kit by mouth once for 1 dose.  Marland Kitchen omeprazole (PRILOSEC) 40 MG capsule Take 1 capsule (40 mg total) by mouth daily.  . sacubitril-valsartan (ENTRESTO) 24-26 MG Take 1 tablet by mouth 2 (two) times daily.  . [DISCONTINUED] olmesartan-hydrochlorothiazide (BENICAR HCT) 20-12.5 MG tablet Take 1 tablet by mouth daily.     Allergies:   Patient has no known allergies.   Social History   Socioeconomic History  . Marital status: Single    Spouse name: Not on file  . Number of children: 1  . Years of education: Not on file  . Highest education level: Some college, no degree  Occupational History  . Occupation: Public affairs consultant   Tobacco Use  . Smoking status: Never Smoker  . Smokeless tobacco: Never Used  Vaping Use  . Vaping Use: Never used  Substance and Sexual Activity  . Alcohol use: No    Alcohol/week: 0.0 standard drinks  . Drug use: No  . Sexual activity: Yes    Partners: Male    Birth control/protection: Injection  Other Topics Concern  . Not on file  Social History Narrative   She used to work at [employer], but  Feb 2017 she left work because  of  MRSA infection.osteomyelitis and uncontrolled DM. She has been back to work since Feb 2018   Lives alone   Social Determinants of Health   Financial Resource Strain: Not on file  Food Insecurity: Not on file  Transportation Needs: Not on file  Physical Activity: Not on file  Stress: Not on file  Social Connections: Not on file     Family History: The patient's family history includes AAA (abdominal aortic aneurysm) in her father; Alzheimer's disease in her father; Breast cancer in her maternal grandmother; Diabetes in her  father, maternal grandmother, and mother; Heart attack in her sister; Heart disease in her father; Hypertension in her father; Seizures in her brother; Stroke in her father; Ulcers in her mother.  ROS:   Please see the history of present illness.     All other systems reviewed and are negative.  EKGs/Labs/Other Studies Reviewed:    The following studies were reviewed today:   EKG:  EKG is  ordered today.  The ekg ordered today demonstrates normal sinus rhythm  Recent Labs: 02/11/2021: ALT 12; BUN 15; Creat 0.98; Hemoglobin 10.6; Platelets 262; Potassium 4.0; Sodium 136  Recent Lipid Panel    Component Value Date/Time   CHOL 297 (H) 02/11/2021 1433   CHOL 208 (H) 02/28/2016 0951   TRIG 86 02/11/2021 1433   HDL 48 (L) 02/11/2021 1433   HDL 42 02/28/2016 0951   CHOLHDL 6.2 (H) 02/11/2021 1433   LDLCALC 229 (H) 02/11/2021 1433     Risk Assessment/Calculations:      Physical Exam:    VS:  BP 140/66 (BP Location: Left Arm, Patient Position: Sitting, Cuff Size: Normal)   Pulse 90   Ht 5' 9"  (1.753 m)   Wt 191 lb (86.6 kg)   SpO2 99%   BMI 28.21 kg/m     Wt Readings from Last 3 Encounters:  04/11/21 191 lb (86.6 kg)  03/25/21 190 lb (86.2 kg)  03/12/21 190 lb 12.8 oz (86.5 kg)     GEN:  Well nourished, well developed in no acute distress HEENT: Normal NECK: No JVD; No carotid bruits LYMPHATICS: No lymphadenopathy CARDIAC: RRR, no murmurs, rubs, gallops RESPIRATORY:  Clear to auscultation without rales, wheezing or rhonchi  ABDOMEN: Soft, non-tender, non-distended MUSCULOSKELETAL:  No edema; No deformity  SKIN: Warm and dry NEUROLOGIC:  Alert and oriented x 3 PSYCHIATRIC:  Normal affect   ASSESSMENT:    1. HFrEF (heart failure with reduced ejection fraction) ([place])   2. Primary hypertension   3. Pure hypercholesterolemia    PLAN:    In order of problems listed above:  1. Patient with previous history of chest pain.  Echocardiogram showed moderately reduced  ejection fraction, EF 35 to 40%.  Plan for ischemic work-up via right and left heart cath.  Start Coreg 6.25 mg twice daily, start Entresto 24- 26 mg twice daily.  Follow-up after heart cath.  She is euvolemic. 2. Hypertension, BP elevated.  Coreg and Entresto as above.  Stop Benicar and HCTZ 3. Hyperlipidemia, continue Lipitor.  Follow-up in 3 weeks  Shared Decision Making/Informed Consent The risks [stroke (1 in 1000), death (1 in 1000), kidney failure [usually temporary] (1 in 500), bleeding (1 in 200), allergic reaction [possibly serious] (1 in 200)], benefits (diagnostic support and management of coronary artery disease) and alternatives of a cardiac catheterization were discussed in detail with Ms. Dufner and she is willing to proceed.      Medication Adjustments/Labs and Tests Ordered: Current medicines  are reviewed at length with the patient today.  Concerns regarding medicines are outlined above.  Orders Placed This Encounter  Procedures  . Basic metabolic panel  . CBC  . EKG 12-Lead   Meds ordered this encounter  Medications  . sacubitril-valsartan (ENTRESTO) 24-26 MG    Sig: Take 1 tablet by mouth 2 (two) times daily.    Dispense:  60 tablet    Refill:  0  . carvedilol (COREG) 6.25 MG tablet    Sig: Take 1 tablet (6.25 mg total) by mouth 2 (two) times daily.    Dispense:  60 tablet    Refill:  5    Patient Instructions  Medication Instructions:   1.  STOP taking olmesartan-hydrochlorothiazide (BENICAR HCT) then wait 48 hours and  2.  START taking Entresto 24-26 MG twice a day.  3.  START taking Coreg (Carvedilol) 6.25 twice a day.  *If you need a refill on your cardiac medications before your next appointment, please call your pharmacy*   Lab Work:  CBC and BMP is drawn today.  COVID PRE- TEST: You will need a COVID TEST prior to the procedure:             LOCATION: [facility] site.             DATE/TIME: Friday  April 29th after your office visit  Testing/Procedures:     [place] [address] [place] [address] Dept: [phone] Loc: [phone]  Binnie Droessler  04/11/2021  You are scheduled for a Cardiac Catheterization on Monday, May 2 with Dr. Harrell Gave End.  1. Please arrive at the [facility] (Main Entrance A) at [facility]: [place],  [address] at 8:30 AM (This time is two hours before your procedure to ensure your preparation). Free valet parking service is available.   Special note: Every effort is made to have your procedure done on time. Please understand that emergencies sometimes delay scheduled procedures.  2. Diet: Do not eat solid foods after midnight.  The patient may have clear liquids until 5am upon the day of the procedure.  3. Labs: Drawn during Office Visit  4. Medication instructions in preparation for your procedure:   Contrast Allergy: No   Current Outpatient Medications (Endocrine & Metabolic):  .  empagliflozin (JARDIANCE) 10 MG TABS tablet, Take 1 tablet (10 mg total) by mouth daily.  Current Outpatient Medications (Cardiovascular):  .  atorvastatin (LIPITOR) 40 MG tablet, Take 1 tablet (40 mg total) by mouth daily.  Current Outpatient Medications (Respiratory):  .  albuterol (VENTOLIN HFA) 108 (90 Base) MCG/ACT inhaler, Inhale 2 puffs into the lungs every 6 (six) hours as needed for wheezing or shortness of breath.    Current Outpatient Medications (Other):  Marland Kitchen  Multiple Vitamins-Minerals (MULTIVITAMIN WITH MINERALS) tablet, Take 1 tablet by mouth daily. .  Na Sulfate-K Sulfate-Mg Sulf 17.5-3.13-1.6 GM/177ML SOLN, Take 1 kit by mouth once for 1 dose. Marland Kitchen  omeprazole (PRILOSEC) 40 MG capsule, Take 1 capsule (40 mg total) by mouth daily. *For reference purposes while preparing patient instructions.   Delete this med list prior  to printing instructions for patient.*   1. Hold your Jardiance the day of your procedure.  2. On the morning of your procedure, take a baby aspirin 81 MG and any morning medicines NOT listed above.  You may use sips of water.  5.  Plan for one night stay--bring personal belongings. 6. Bring a current list of your medications and current insurance cards. 7. You MUST have a responsible person to drive you home. 8. Someone MUST be with you the first 24 hours after you arrive home or your discharge will be delayed. 9. Please wear clothes that are easy to get on and off and wear slip-on shoes.  Thank you for allowing [place] to care for you!   -- [place] [facility]    Follow-Up: At [facility], you and your health needs are our priority.  As part of our continuing mission to provide you with exceptional heart care, we have created designated Provider Care Teams.  These Care Teams include your primary Cardiologist (physician) and Advanced Practice Providers (APPs -  Physician Assistants and Nurse Practitioners) who all work together to provide you with the care you need, when you need it.  We recommend signing up for the patient portal called "MyChart".  Sign up information is provided on this After Visit Summary.  MyChart is used to connect with patients for Virtual Visits (Telemedicine).  Patients are able to view lab/test results, encounter notes, upcoming appointments, etc.  Non-urgent messages can be sent to your provider as well.   To learn more about what you can do with MyChart, go to [email or web address].    Your next appointment:   3 week(s)  The format for your next appointment:   In Person  Provider:   Kate Sable, MD   Other Instructions      Signed, Kate Sable, MD  04/11/2021 12:36 PM    [place]

## 2021-04-11 NOTE — Telephone Encounter (Signed)
Spoke with patient and she is okay with moving procedure to May 5th. Will send updated instructions via my chart. Pt verbalized understanding.

## 2021-04-11 NOTE — Progress Notes (Signed)
New prep has been sent to pharmacy. updated instructions sent to my chart.

## 2021-04-11 NOTE — Progress Notes (Signed)
Cardiology Office Note:    Date:  04/11/2021   ID:  Sarah Duffy, DOB 02-28-1971, MRN [id number]  PCP:  Steele Sizer, MD   [place]  Cardiologist:  Kate Sable, MD  Advanced Practice Provider:  No care team member to display Electrophysiologist:  None       Referring MD: Steele Sizer, MD   Chief Complaint  Patient presents with  . Other    Follow up post ECHO. Meds reviewed verbally with patient.      History of Present Illness:    Sarah Duffy is a 50 y.o. female with a hx of hypertension, hyperlipidemia who presents for follow-up.  She was last seen due to chest pain.  Echocardiogram was ordered to evaluate cardiac function due to risk factors.  States symptoms of chest pains have resolved, denies shortness of breath, edema.  Tolerating Lipitor as prescribed.  Presents for echocardiogram results.  Past Medical History:  Diagnosis Date  . Diabetes mellitus without complication ([place])   . MRSA infection within last 3 months 02/25/2016  . Osteomyelitis of foot ([place]) 08/26/2016    Past Surgical History:  Procedure Laterality Date  . CHOLECYSTECTOMY  1999  . COLONOSCOPY WITH PROPOFOL N/A 03/25/2021   Procedure: COLONOSCOPY WITH PROPOFOL;  Surgeon: Jonathon Bellows, MD;  Location: [facility] ENDOSCOPY;  Service: Gastroenterology;  Laterality: N/A;  . ESOPHAGOGASTRODUODENOSCOPY (EGD) WITH PROPOFOL N/A 03/25/2021   Procedure: ESOPHAGOGASTRODUODENOSCOPY (EGD) WITH PROPOFOL;  Surgeon: Jonathon Bellows, MD;  Location: [facility] ENDOSCOPY;  Service: Gastroenterology;  Laterality: N/A;  . TOE SURGERY Left 02/07/2016   [place]    Current Medications: Current Meds  Medication Sig  . albuterol (VENTOLIN HFA) 108 (90 Base) MCG/ACT inhaler Inhale 2 puffs into the lungs every 6 (six) hours as needed for wheezing or shortness of breath.  Marland Kitchen atorvastatin (LIPITOR) 40 MG tablet Take 1 tablet (40 mg total) by mouth daily.  . carvedilol (COREG) 6.25 MG tablet Take 1 tablet  (6.25 mg total) by mouth 2 (two) times daily.  . empagliflozin (JARDIANCE) 10 MG TABS tablet Take 1 tablet (10 mg total) by mouth daily.  . Multiple Vitamins-Minerals (MULTIVITAMIN WITH MINERALS) tablet Take 1 tablet by mouth daily.  . Na Sulfate-K Sulfate-Mg Sulf 17.5-3.13-1.6 GM/177ML SOLN Take 1 kit by mouth once for 1 dose.  Marland Kitchen omeprazole (PRILOSEC) 40 MG capsule Take 1 capsule (40 mg total) by mouth daily.  . sacubitril-valsartan (ENTRESTO) 24-26 MG Take 1 tablet by mouth 2 (two) times daily.  . [DISCONTINUED] olmesartan-hydrochlorothiazide (BENICAR HCT) 20-12.5 MG tablet Take 1 tablet by mouth daily.     Allergies:   Patient has no known allergies.   Social History   Socioeconomic History  . Marital status: Single    Spouse name: Not on file  . Number of children: 1  . Years of education: Not on file  . Highest education level: Some college, no degree  Occupational History  . Occupation: Public affairs consultant   Tobacco Use  . Smoking status: Never Smoker  . Smokeless tobacco: Never Used  Vaping Use  . Vaping Use: Never used  Substance and Sexual Activity  . Alcohol use: No    Alcohol/week: 0.0 standard drinks  . Drug use: No  . Sexual activity: Yes    Partners: Male    Birth control/protection: Injection  Other Topics Concern  . Not on file  Social History Narrative   She used to work at [employer], but  Feb 2017 she left work because  of  MRSA infection.osteomyelitis and uncontrolled DM. She has been back to work since Feb 2018   Lives alone   Social Determinants of Health   Financial Resource Strain: Not on file  Food Insecurity: Not on file  Transportation Needs: Not on file  Physical Activity: Not on file  Stress: Not on file  Social Connections: Not on file     Family History: The patient's family history includes AAA (abdominal aortic aneurysm) in her father; Alzheimer's disease in her father; Breast cancer in her maternal grandmother; Diabetes in her  father, maternal grandmother, and mother; Heart attack in her sister; Heart disease in her father; Hypertension in her father; Seizures in her brother; Stroke in her father; Ulcers in her mother.  ROS:   Please see the history of present illness.     All other systems reviewed and are negative.  EKGs/Labs/Other Studies Reviewed:    The following studies were reviewed today:   EKG:  EKG is  ordered today.  The ekg ordered today demonstrates normal sinus rhythm  Recent Labs: 02/11/2021: ALT 12; BUN 15; Creat 0.98; Hemoglobin 10.6; Platelets 262; Potassium 4.0; Sodium 136  Recent Lipid Panel    Component Value Date/Time   CHOL 297 (H) 02/11/2021 1433   CHOL 208 (H) 02/28/2016 0951   TRIG 86 02/11/2021 1433   HDL 48 (L) 02/11/2021 1433   HDL 42 02/28/2016 0951   CHOLHDL 6.2 (H) 02/11/2021 1433   LDLCALC 229 (H) 02/11/2021 1433     Risk Assessment/Calculations:      Physical Exam:    VS:  BP 140/66 (BP Location: Left Arm, Patient Position: Sitting, Cuff Size: Normal)   Pulse 90   Ht 5' 9"  (1.753 m)   Wt 191 lb (86.6 kg)   SpO2 99%   BMI 28.21 kg/m     Wt Readings from Last 3 Encounters:  04/11/21 191 lb (86.6 kg)  03/25/21 190 lb (86.2 kg)  03/12/21 190 lb 12.8 oz (86.5 kg)     GEN:  Well nourished, well developed in no acute distress HEENT: Normal NECK: No JVD; No carotid bruits LYMPHATICS: No lymphadenopathy CARDIAC: RRR, no murmurs, rubs, gallops RESPIRATORY:  Clear to auscultation without rales, wheezing or rhonchi  ABDOMEN: Soft, non-tender, non-distended MUSCULOSKELETAL:  No edema; No deformity  SKIN: Warm and dry NEUROLOGIC:  Alert and oriented x 3 PSYCHIATRIC:  Normal affect   ASSESSMENT:    1. HFrEF (heart failure with reduced ejection fraction) ([place])   2. Primary hypertension   3. Pure hypercholesterolemia    PLAN:    In order of problems listed above:  1. Patient with previous history of chest pain.  Echocardiogram showed moderately reduced  ejection fraction, EF 35 to 40%.  Plan for ischemic work-up via right and left heart cath.  Start Coreg 6.25 mg twice daily, start Entresto 24- 26 mg twice daily.  Follow-up after heart cath.  She is euvolemic. 2. Hypertension, BP elevated.  Coreg and Entresto as above.  Stop Benicar and HCTZ 3. Hyperlipidemia, continue Lipitor.  Follow-up in 3 weeks  Shared Decision Making/Informed Consent The risks [stroke (1 in 1000), death (1 in 1000), kidney failure [usually temporary] (1 in 500), bleeding (1 in 200), allergic reaction [possibly serious] (1 in 200)], benefits (diagnostic support and management of coronary artery disease) and alternatives of a cardiac catheterization were discussed in detail with Ms. Klebba and she is willing to proceed.      Medication Adjustments/Labs and Tests Ordered: Current medicines  are reviewed at length with the patient today.  Concerns regarding medicines are outlined above.  Orders Placed This Encounter  Procedures  . Basic metabolic panel  . CBC  . EKG 12-Lead   Meds ordered this encounter  Medications  . sacubitril-valsartan (ENTRESTO) 24-26 MG    Sig: Take 1 tablet by mouth 2 (two) times daily.    Dispense:  60 tablet    Refill:  0  . carvedilol (COREG) 6.25 MG tablet    Sig: Take 1 tablet (6.25 mg total) by mouth 2 (two) times daily.    Dispense:  60 tablet    Refill:  5    Patient Instructions  Medication Instructions:   1.  STOP taking olmesartan-hydrochlorothiazide (BENICAR HCT) then wait 48 hours and  2.  START taking Entresto 24-26 MG twice a day.  3.  START taking Coreg (Carvedilol) 6.25 twice a day.  *If you need a refill on your cardiac medications before your next appointment, please call your pharmacy*   Lab Work:  CBC and BMP is drawn today.  COVID PRE- TEST: You will need a COVID TEST prior to the procedure:             LOCATION: [place] Pre-Op [facility]-Thru Testing site.             DATE/TIME: Friday  April 29th after your office visit  Testing/Procedures:     [place] [address] [place] [address] Dept: [phone] Loc: [phone]  Sariya Trickey  04/11/2021  You are scheduled for a Cardiac Catheterization on Monday, May 2 with Dr. Harrell Gave End.  1. Please arrive at the [facility] (Main Entrance A) at [facility]: [place],  [address] at 8:30 AM (This time is two hours before your procedure to ensure your preparation). Free valet parking service is available.   Special note: Every effort is made to have your procedure done on time. Please understand that emergencies sometimes delay scheduled procedures.  2. Diet: Do not eat solid foods after midnight.  The patient may have clear liquids until 5am upon the day of the procedure.  3. Labs: Drawn during Office Visit  4. Medication instructions in preparation for your procedure:   Contrast Allergy: No   Current Outpatient Medications (Endocrine & Metabolic):  .  empagliflozin (JARDIANCE) 10 MG TABS tablet, Take 1 tablet (10 mg total) by mouth daily.  Current Outpatient Medications (Cardiovascular):  .  atorvastatin (LIPITOR) 40 MG tablet, Take 1 tablet (40 mg total) by mouth daily.  Current Outpatient Medications (Respiratory):  .  albuterol (VENTOLIN HFA) 108 (90 Base) MCG/ACT inhaler, Inhale 2 puffs into the lungs every 6 (six) hours as needed for wheezing or shortness of breath.    Current Outpatient Medications (Other):  Marland Kitchen  Multiple Vitamins-Minerals (MULTIVITAMIN WITH MINERALS) tablet, Take 1 tablet by mouth daily. .  Na Sulfate-K Sulfate-Mg Sulf 17.5-3.13-1.6 GM/177ML SOLN, Take 1 kit by mouth once for 1 dose. Marland Kitchen  omeprazole (PRILOSEC) 40 MG capsule, Take 1 capsule (40 mg total) by mouth daily. *For reference purposes while preparing patient instructions.   Delete this med list prior  to printing instructions for patient.*   1. Hold your Jardiance the day of your procedure.  2. On the morning of your procedure, take a baby aspirin 81 MG and any morning medicines NOT listed above.  You may use sips of water.  5.  Plan for one night stay--bring personal belongings. 6. Bring a current list of your medications and current insurance cards. 7. You MUST have a responsible person to drive you home. 8. Someone MUST be with you the first 24 hours after you arrive home or your discharge will be delayed. 9. Please wear clothes that are easy to get on and off and wear slip-on shoes.  Thank you for allowing [place] to care for you!   -- [place] Invasive Cardiovascular services    Follow-Up: At [facility], you and your health needs are our priority.  As part of our continuing mission to provide you with exceptional heart care, we have created designated Provider Care Teams.  These Care Teams include your primary Cardiologist (physician) and Advanced Practice Providers (APPs -  Physician Assistants and Nurse Practitioners) who all work together to provide you with the care you need, when you need it.  We recommend signing up for the patient portal called "MyChart".  Sign up information is provided on this After Visit Summary.  MyChart is used to connect with patients for Virtual Visits (Telemedicine).  Patients are able to view lab/test results, encounter notes, upcoming appointments, etc.  Non-urgent messages can be sent to your provider as well.   To learn more about what you can do with MyChart, go to [email or web address].    Your next appointment:   3 week(s)  The format for your next appointment:   In Person  Provider:   Kate Sable, MD   Other Instructions      Signed, Kate Sable, MD  04/11/2021 12:36 PM    [place]

## 2021-04-11 NOTE — Patient Instructions (Signed)
Medication Instructions:   1.  STOP taking olmesartan-hydrochlorothiazide (BENICAR HCT) then wait 48 hours and  2.  START taking Entresto 24-26 MG twice a day.  3.  START taking Coreg (Carvedilol) 6.25 twice a day.  *If you need a refill on your cardiac medications before your next appointment, please call your pharmacy*   Lab Work:  CBC and BMP is drawn today.  COVID PRE- TEST: You will need a COVID TEST prior to the procedure:             LOCATION: [place] Pre-Op Admission Drive-Thru Testing site.             DATE/TIME: Friday April 29th after your office visit  Testing/Procedures:     [place] [address] [place] [address] Dept: [phone] Loc: [phone]  Holiday Mcmenamin  04/11/2021  You are scheduled for a Cardiac Catheterization on Monday, May 2 with Dr. Harrell Gave End.  1. Please arrive at the [facility] (Main Entrance A) at [facility]: [place], [place] [address] at 8:30 AM (This time is two hours before your procedure to ensure your preparation). Free valet parking service is available.   Special note: Every effort is made to have your procedure done on time. Please understand that emergencies sometimes delay scheduled procedures.  2. Diet: Do not eat solid foods after midnight.  The patient may have clear liquids until 5am upon the day of the procedure.  3. Labs: Drawn during Office Visit  4. Medication instructions in preparation for your procedure:   Contrast Allergy: No   Current Outpatient Medications (Endocrine & Metabolic):  .  empagliflozin (JARDIANCE) 10 MG TABS tablet, Take 1 tablet (10 mg total) by mouth daily.  Current Outpatient Medications (Cardiovascular):  .  atorvastatin (LIPITOR) 40 MG tablet, Take 1 tablet (40 mg total) by mouth daily.  Current Outpatient Medications (Respiratory):  .  albuterol  (VENTOLIN HFA) 108 (90 Base) MCG/ACT inhaler, Inhale 2 puffs into the lungs every 6 (six) hours as needed for wheezing or shortness of breath.    Current Outpatient Medications (Other):  Marland Kitchen  Multiple Vitamins-Minerals (MULTIVITAMIN WITH MINERALS) tablet, Take 1 tablet by mouth daily. .  Na Sulfate-K Sulfate-Mg Sulf 17.5-3.13-1.6 GM/177ML SOLN, Take 1 kit by mouth once for 1 dose. Marland Kitchen  omeprazole (PRILOSEC) 40 MG capsule, Take 1 capsule (40 mg total) by mouth daily. *For reference purposes while preparing patient instructions.   Delete this med list prior to printing instructions for patient.*   1. Hold your Jardiance the day of your procedure.  2. On the morning of your procedure, take a baby aspirin 81 MG and any morning medicines NOT listed above.  You may use sips of water.  5. Plan for one night stay--bring personal belongings. 6. Bring a current list of your medications and current insurance cards. 7. You MUST have a responsible person to drive you home. 8. Someone MUST be with you the first 24 hours after you arrive home or your discharge will be delayed. 9. Please wear clothes that are easy to get on and off and wear slip-on shoes.  Thank you for allowing [place] to care for you!   --  Invasive Cardiovascular services    Follow-Up: At [facility], you and your health needs are our priority.  As part of our continuing mission to provide you with exceptional heart care, we have created designated  Provider Care Teams.  These Care Teams include your primary Cardiologist (physician) and Advanced Practice Providers (APPs -  Physician Assistants and Nurse Practitioners) who all work together to provide you with the care you need, when you need it.  We recommend signing up for the patient portal called "MyChart".  Sign up information is provided on this After Visit Summary.  MyChart is used to connect with patients for Virtual Visits (Telemedicine).  Patients are able to view  lab/test results, encounter notes, upcoming appointments, etc.  Non-urgent messages can be sent to your provider as well.   To learn more about what you can do with MyChart, go to [email or web address].    Your next appointment:   3 week(s)  The format for your next appointment:   In Person  Provider:   Kate Sable, MD   Other Instructions

## 2021-04-12 LAB — CBC
Hematocrit: 32.3 % — ABNORMAL LOW (ref 34.0–46.6)
Hemoglobin: 11 g/dL — ABNORMAL LOW (ref 11.1–15.9)
MCH: 28.6 pg (ref 26.6–33.0)
MCHC: 34.1 g/dL (ref 31.5–35.7)
MCV: 84 fL (ref 79–97)
Platelets: 249 10*3/uL (ref 150–450)
RBC: 3.85 x10E6/uL (ref 3.77–5.28)
RDW: 13.1 % (ref 11.7–15.4)
WBC: 8.9 10*3/uL (ref 3.4–10.8)

## 2021-04-12 LAB — BASIC METABOLIC PANEL
BUN/Creatinine Ratio: 18 (ref 9–23)
BUN: 24 mg/dL (ref 6–24)
CO2: 19 mmol/L — ABNORMAL LOW (ref 20–29)
Calcium: 9.6 mg/dL (ref 8.7–10.2)
Chloride: 103 mmol/L (ref 96–106)
Creatinine, Ser: 1.34 mg/dL — ABNORMAL HIGH (ref 0.57–1.00)
Glucose: 264 mg/dL — ABNORMAL HIGH (ref 65–99)
Potassium: 5 mmol/L (ref 3.5–5.2)
Sodium: 139 mmol/L (ref 134–144)
eGFR: 48 mL/min/{1.73_m2} — ABNORMAL LOW (ref >59)

## 2021-04-14 ENCOUNTER — Encounter
Admission: RE | Disposition: A | Discharge status: Home / Self Care | Payer: Self-pay | Source: Home / Self Care | Attending: Internal Medicine

## 2021-04-14 ENCOUNTER — Encounter: Payer: Self-pay | Admitting: Internal Medicine

## 2021-04-14 ENCOUNTER — Ambulatory Visit
Admission: RE | Admit: 2021-04-14 | Discharge: 2021-04-14 | Disposition: A | Discharge status: Home / Self Care | Payer: 59 | Attending: Internal Medicine | Admitting: Internal Medicine

## 2021-04-14 ENCOUNTER — Other Ambulatory Visit: Payer: Self-pay

## 2021-04-14 DIAGNOSIS — I429 Cardiomyopathy, unspecified: Secondary | ICD-10-CM | POA: Diagnosis not present

## 2021-04-14 DIAGNOSIS — R0789 Other chest pain: Secondary | ICD-10-CM | POA: Insufficient documentation

## 2021-04-14 DIAGNOSIS — E785 Hyperlipidemia, unspecified: Secondary | ICD-10-CM | POA: Diagnosis not present

## 2021-04-14 DIAGNOSIS — Z79899 Other long term (current) drug therapy: Secondary | ICD-10-CM | POA: Diagnosis not present

## 2021-04-14 DIAGNOSIS — Z7984 Long term (current) use of oral hypoglycemic drugs: Secondary | ICD-10-CM | POA: Diagnosis not present

## 2021-04-14 DIAGNOSIS — I2510 Atherosclerotic heart disease of native coronary artery without angina pectoris: Secondary | ICD-10-CM | POA: Diagnosis not present

## 2021-04-14 DIAGNOSIS — I509 Heart failure, unspecified: Secondary | ICD-10-CM | POA: Diagnosis not present

## 2021-04-14 DIAGNOSIS — E119 Type 2 diabetes mellitus without complications: Secondary | ICD-10-CM | POA: Insufficient documentation

## 2021-04-14 DIAGNOSIS — I110 Hypertensive heart disease with heart failure: Secondary | ICD-10-CM | POA: Insufficient documentation

## 2021-04-14 DIAGNOSIS — I5020 Unspecified systolic (congestive) heart failure: Secondary | ICD-10-CM

## 2021-04-14 HISTORY — PX: RIGHT/LEFT HEART CATH AND CORONARY ANGIOGRAPHY: CATH118266

## 2021-04-14 LAB — GLUCOSE, CAPILLARY: Glucose-Capillary: 195 mg/dL — ABNORMAL HIGH (ref 70–99)

## 2021-04-14 LAB — POCT ACTIVATED CLOTTING TIME: Activated Clotting Time: 255 seconds

## 2021-04-14 SURGERY — RIGHT/LEFT HEART CATH AND CORONARY ANGIOGRAPHY
Anesthesia: Moderate Sedation | Laterality: Bilateral

## 2021-04-14 MED ORDER — VERAPAMIL HCL 2.5 MG/ML IV SOLN
INTRAVENOUS | Status: DC | PRN
  Administered 2021-04-14: 2.5 mg via INTRA_ARTERIAL

## 2021-04-14 MED ORDER — HEPARIN (PORCINE) IN NACL 1000-0.9 UT/500ML-% IV SOLN
INTRAVENOUS | Status: DC | PRN
  Administered 2021-04-14: 500 mL

## 2021-04-14 MED ORDER — LABETALOL HCL 5 MG/ML IV SOLN
10.0000 mg | INTRAVENOUS | Status: DC | PRN
Start: 2021-04-14 — End: 2021-04-14

## 2021-04-14 MED ORDER — SODIUM CHLORIDE 0.9% FLUSH: 3.0000 mL | Freq: Two times a day (BID) | INTRAVENOUS | Status: DC

## 2021-04-14 MED ORDER — FENTANYL CITRATE (PF) 100 MCG/2ML IJ SOLN
INTRAMUSCULAR | Status: AC
  Filled 2021-04-14: qty 2

## 2021-04-14 MED ORDER — ASPIRIN EC 81 MG PO TBEC: 81.0000 mg | DELAYED_RELEASE_TABLET | Freq: Every day | ORAL | Status: AC

## 2021-04-14 MED ORDER — SODIUM CHLORIDE 0.9 % IV SOLN: 250.0000 mL | INTRAVENOUS | Status: DC | PRN

## 2021-04-14 MED ORDER — HEPARIN (PORCINE) IN NACL 1000-0.9 UT/500ML-% IV SOLN
INTRAVENOUS | Status: AC
  Filled 2021-04-14: qty 1000

## 2021-04-14 MED ORDER — NITROGLYCERIN 0.4 MG SL SUBL: 0.4000 mg | SUBLINGUAL_TABLET | SUBLINGUAL | 99 refills | Status: DC | PRN

## 2021-04-14 MED ORDER — CARVEDILOL 6.25 MG PO TABS: 12.5000 mg | ORAL_TABLET | Freq: Two times a day (BID) | ORAL | 5 refills | Status: DC

## 2021-04-14 MED ORDER — IOHEXOL 300 MG/ML  SOLN
INTRAMUSCULAR | Status: DC | PRN
  Administered 2021-04-14: 54 mL

## 2021-04-14 MED ORDER — HEPARIN SODIUM (PORCINE) 1000 UNIT/ML IJ SOLN
INTRAMUSCULAR | Status: AC
  Filled 2021-04-14: qty 1

## 2021-04-14 MED ORDER — HEPARIN SODIUM (PORCINE) 1000 UNIT/ML IJ SOLN
INTRAMUSCULAR | Status: DC | PRN
  Administered 2021-04-14 (×2): 4500 [IU] via INTRAVENOUS

## 2021-04-14 MED ORDER — VERAPAMIL HCL 2.5 MG/ML IV SOLN
INTRAVENOUS | Status: AC
  Filled 2021-04-14: qty 2

## 2021-04-14 MED ORDER — SODIUM CHLORIDE 0.9% FLUSH: 3.0000 mL | INTRAVENOUS | Status: DC | PRN

## 2021-04-14 MED ORDER — ACETAMINOPHEN 325 MG PO TABS: 650.0000 mg | ORAL_TABLET | ORAL | Status: DC | PRN

## 2021-04-14 MED ORDER — HYDRALAZINE HCL 20 MG/ML IJ SOLN: 10.0000 mg | INTRAMUSCULAR | Status: DC | PRN

## 2021-04-14 MED ORDER — SODIUM CHLORIDE 0.9 % IV SOLN: INTRAVENOUS | Status: DC

## 2021-04-14 MED ORDER — MIDAZOLAM HCL 2 MG/2ML IJ SOLN
INTRAMUSCULAR | Status: AC
  Filled 2021-04-14: qty 2

## 2021-04-14 MED ORDER — MIDAZOLAM HCL 2 MG/2ML IJ SOLN
INTRAMUSCULAR | Status: DC | PRN
  Administered 2021-04-14: 1 mg via INTRAVENOUS

## 2021-04-14 MED ORDER — NITROGLYCERIN 1 MG/10 ML FOR IR/CATH LAB
INTRA_ARTERIAL | Status: AC
  Filled 2021-04-14: qty 10

## 2021-04-14 MED ORDER — FENTANYL CITRATE (PF) 100 MCG/2ML IJ SOLN
INTRAMUSCULAR | Status: DC | PRN
  Administered 2021-04-14: 25 ug via INTRAVENOUS

## 2021-04-14 MED ORDER — ONDANSETRON HCL 4 MG/2ML IJ SOLN: 4.0000 mg | Freq: Four times a day (QID) | INTRAMUSCULAR | Status: DC | PRN

## 2021-04-14 SURGICAL SUPPLY — 17 items
CATH 5F 110X4 TIG (CATHETERS) ×1 IMPLANT
CATH INFINITI 5FR ANG PIGTAIL (CATHETERS) ×1 IMPLANT
CATH LAUNCHER 6FR EBU3.5 (CATHETERS) ×1 IMPLANT
CATH SWAN GANZ 7F STRAIGHT (CATHETERS) ×1 IMPLANT
DEVICE RAD TR BAND REGULAR (VASCULAR PRODUCTS) ×1 IMPLANT
DRAPE BRACHIAL (DRAPES) ×2 IMPLANT
GLIDESHEATH SLEND SS 6F .021 (SHEATH) ×1 IMPLANT
GLIDESHEATH SLENDER 7FR .021G (SHEATH) ×1 IMPLANT
GUIDEWIRE INQWIRE 1.5J.035X260 (WIRE) IMPLANT
GUIDEWIRE PRESS OMNI 185 ST (WIRE) ×1 IMPLANT
INQWIRE 1.5J .035X260CM (WIRE) ×2
KIT ENCORE 26 ADVANTAGE (KITS) ×1 IMPLANT
PACK CARDIAC CATH (CUSTOM PROCEDURE TRAY) ×2 IMPLANT
PROTECTION STATION PRESSURIZED (MISCELLANEOUS) ×2
SET ATX SIMPLICITY (MISCELLANEOUS) ×1 IMPLANT
SHEATH GLIDE SLENDER 4/5FR (SHEATH) IMPLANT
STATION PROTECTION PRESSURIZED (MISCELLANEOUS) IMPLANT

## 2021-04-14 NOTE — Interval H&P Note (Signed)
History and Physical Interval Note:  04/14/2021 10:15 AM  Sarah Duffy  has presented today for surgery, with the diagnosis of atypical chest pain and cardiomyopathy.  The various methods of treatment have been discussed with the patient and family. After consideration of risks, benefits and other options for treatment, the patient has consented to  Procedure(s): RIGHT/LEFT HEART CATH AND CORONARY ANGIOGRAPHY (Bilateral) as a surgical intervention.  The patient's history has been reviewed, patient examined, no change in status, stable for surgery.  I have reviewed the patient's chart and labs.  Questions were answered to the patient's satisfaction.    Cath Lab Visit (complete for each Cath Lab visit)  Clinical Evaluation Leading to the Procedure:   ACS: No.  Non-ACS:    Anginal Classification: CCS IV  Anti-ischemic medical therapy: Minimal Therapy (1 class of medications)  Non-Invasive Test Results: No non-invasive testing performed (LVEF 35-40% by echo)  Prior CABG: No previous CABG  Sarah Duffy

## 2021-04-14 NOTE — Brief Op Note (Signed)
BRIEF CARDIAC CATHETERIZATION NOTE  04/14/2021  11:39 AM  PATIENT:  Sarah Duffy  50 y.o. female  PRE-OPERATIVE DIAGNOSIS:  Cardiomyopathy and atypical chest pain  POST-OPERATIVE DIAGNOSIS:  Multivessel coronary artery disease  PROCEDURE:  Procedure(s): RIGHT/LEFT HEART CATH AND CORONARY ANGIOGRAPHY (Bilateral)  SURGEON:  Surgeon(s) and Role:    * Keimya Briddell, Cristal Deer, MD - Primary  FINDINGS: 1. 3-vessel CAD. 2. Normal to mildly elevated left heart filling pressures. 3. Normal right heart and pulmonary artery pressures. 4. Normal cardiac output/index.  RECOMMENDATIONS: 1. TCTS consultation. 2. Optimize GDMT. 3. Secondary prevention of CAD.  Yvonne Kendall, MD [facility]

## 2021-04-15 ENCOUNTER — Encounter: Payer: Self-pay | Admitting: Internal Medicine

## 2021-04-15 ENCOUNTER — Encounter: Payer: 59 | Admitting: Surgery

## 2021-04-16 ENCOUNTER — Other Ambulatory Visit: Payer: Self-pay | Admitting: *Deleted

## 2021-04-16 ENCOUNTER — Encounter: Payer: Self-pay | Admitting: Surgery

## 2021-04-16 ENCOUNTER — Institutional Professional Consult (permissible substitution) (INDEPENDENT_AMBULATORY_CARE_PROVIDER_SITE_OTHER): Payer: 59 | Admitting: Surgery

## 2021-04-16 ENCOUNTER — Other Ambulatory Visit: Payer: Self-pay

## 2021-04-16 ENCOUNTER — Encounter: Payer: Self-pay | Admitting: *Deleted

## 2021-04-16 VITALS — BP 109/74 | HR 88 | Resp 20 | Ht 69.0 in

## 2021-04-16 DIAGNOSIS — I2510 Atherosclerotic heart disease of native coronary artery without angina pectoris: Secondary | ICD-10-CM | POA: Diagnosis not present

## 2021-04-16 NOTE — Progress Notes (Signed)
Cardiothoracic Surgery Consultation  PCP is [name], MD Referring Provider is [name], MD  Chief Complaint  Patient presents with  . Coronary Artery Disease    Surgical consult, Cardiac Cath 04/14/21, ECHO 03/12/21    HPI:  The patient is a 50 year old woman with a history of hypertension, hyperlipidemia, and diabetes who developed left-sided chest discomfort as well as pain in her left shoulder and upper back in early March.  The symptoms lasted for several days and she has had no recurrence.  She denies any shortness of breath.  She has not noticed some decreased energy level and dizzy spells.  She denies orthopnea and peripheral edema.  She was seen by cardiology on 02/21/2021.  2D echocardiogram on 03/12/2021 showed moderate LV dysfunction with ejection fraction of 35 to 40% with global hypokinesis.  There was moderate mitral regurgitation.  She subsequently underwent cardiac catheterization on 04/14/2021 showing severe multivessel coronary disease.  The LAD had 70% proximal to mid stenosis, 60% mid stenosis, and 80% distal stenosis.  The left circumflex had a moderate sized second marginal branch that had 99% stenosis.  The right coronary artery was diffusely diseased with 60% distal stenosis.  The PDA had 75% proximal stenosis.  Right heart catheterization showed PA pressure of 26/16 with a wedge pressure of 15.  Right atrial pressure was 8.  Cardiac index was 2.8 and PA sat was 75%.  The patient is single and lives alone.  She is a non-smoker.  She works at [employer]. Past Medical History:  Diagnosis Date  . Diabetes mellitus without complication (HCC)   . MRSA infection within last 3 months 02/25/2016  . Osteomyelitis of foot (HCC) 08/26/2016    Past Surgical History:  Procedure Laterality Date  . CHOLECYSTECTOMY  1999  . COLONOSCOPY WITH PROPOFOL N/A 03/25/2021   Procedure: COLONOSCOPY WITH PROPOFOL;  Surgeon: [name], MD;  Location: [facility] ENDOSCOPY;  Service:  Gastroenterology;  Laterality: N/A;  . ESOPHAGOGASTRODUODENOSCOPY (EGD) WITH PROPOFOL N/A 03/25/2021   Procedure: ESOPHAGOGASTRODUODENOSCOPY (EGD) WITH PROPOFOL;  Surgeon: [name], MD;  Location: [facility] ENDOSCOPY;  Service: Gastroenterology;  Laterality: N/A;  . RIGHT/LEFT HEART CATH AND CORONARY ANGIOGRAPHY Bilateral 04/14/2021   Procedure: RIGHT/LEFT HEART CATH AND CORONARY ANGIOGRAPHY;  Surgeon: Yvonne Kendall, MD;  Location: [facility] INVASIVE CV LAB;  Service: Cardiovascular;  Laterality: Bilateral;  . TOE SURGERY Left 02/07/2016   Pinky Toe    Family History  Problem Relation Age of Onset  . Diabetes Mother   . Ulcers Mother   . Heart disease Father   . AAA (abdominal aortic aneurysm) Father   . Diabetes Father   . Hypertension Father   . Stroke Father   . Alzheimer's disease Father   . Heart attack Sister   . Seizures Brother   . Diabetes Maternal Grandmother   . Breast cancer Maternal Grandmother     Social History Social History   Tobacco Use  . Smoking status: Never Smoker  . Smokeless tobacco: Never Used  Vaping Use  . Vaping Use: Never used  Substance Use Topics  . Alcohol use: No    Alcohol/week: 0.0 standard drinks  . Drug use: No    Current Outpatient Medications  Medication Sig Dispense Refill  . albuterol (VENTOLIN HFA) 108 (90 Base) MCG/ACT inhaler Inhale 2 puffs into the lungs every 6 (six) hours as needed for wheezing or shortness of breath. 8 g 0  . aspirin EC 81 MG tablet Take 1 tablet (81 mg total) by  mouth daily. Swallow whole.    Marland Kitchen atorvastatin (LIPITOR) 40 MG tablet Take 1 tablet (40 mg total) by mouth daily. 90 tablet 1  . carvedilol (COREG) 6.25 MG tablet Take 2 tablets (12.5 mg total) by mouth 2 (two) times daily. 60 tablet 5  . empagliflozin (JARDIANCE) 10 MG TABS tablet Take 1 tablet (10 mg total) by mouth daily. 30 tablet 2  . Multiple Vitamins-Minerals (MULTIVITAMIN WITH MINERALS) tablet Take 1 tablet by mouth daily. 100 tablet 0  .  nitroGLYCERIN (NITROSTAT) 0.4 MG SL tablet Place 1 tablet (0.4 mg total) under the tongue every 5 (five) minutes as needed for chest pain. 25 tablet prn  . omeprazole (PRILOSEC) 40 MG capsule Take 1 capsule (40 mg total) by mouth daily. 90 capsule 3  . sacubitril-valsartan (ENTRESTO) 24-26 MG Take 1 tablet by mouth 2 (two) times daily. 60 tablet 0   No current facility-administered medications for this visit.    No Known Allergies  Review of Systems  Constitutional: Positive for fatigue. Negative for activity change.  HENT: Negative.   Eyes: Negative.   Respiratory: Negative for shortness of breath.   Cardiovascular: Positive for chest pain. Negative for leg swelling.  Gastrointestinal: Negative.   Endocrine: Negative.   Genitourinary: Negative.   Musculoskeletal: Negative.   Allergic/Immunologic: Negative.   Neurological: Positive for dizziness. Negative for syncope.  Hematological: Negative.   Psychiatric/Behavioral: Negative.     BP 109/74 (BP Location: Left Arm, Patient Position: Sitting)   Pulse 88   Resp 20   Ht 5\' 9"  (1.753 m)   SpO2 96% Comment: RA  BMI 28.21 kg/m  Physical Exam Constitutional:      Appearance: Normal appearance. She is normal weight.  HENT:     Head: Normocephalic and atraumatic.  Eyes:     Conjunctiva/sclera: Conjunctivae normal.     Pupils: Pupils are equal, round, and reactive to light.  Neck:     Vascular: No carotid bruit.  Cardiovascular:     Rate and Rhythm: Normal rate and regular rhythm.     Pulses: Normal pulses.     Heart sounds: Normal heart sounds. No murmur heard.   Pulmonary:     Effort: Pulmonary effort is normal.     Breath sounds: Normal breath sounds.  Abdominal:     Tenderness: There is no abdominal tenderness.  Musculoskeletal:        General: No swelling.     Cervical back: Normal range of motion and neck supple. No tenderness.  Skin:    General: Skin is warm and dry.  Neurological:     General: No focal  deficit present.     Mental Status: She is alert and oriented to person, place, and time.  Psychiatric:        Mood and Affect: Mood normal.        Behavior: Behavior normal.      Diagnostic Tests:  Physicians  Panel Physicians Referring Physician Case Authorizing Physician  End, , MD (Primary)      Procedures  RIGHT/LEFT HEART CATH AND CORONARY ANGIOGRAPHY   Conclusion  Conclusions: 1. Multivessel coronary artery disease including mild to moderate diffuse plaquing of the LAD and RCA.  The mid/distal LAD demonstrates 3 focal lesions of up to 80% that are hemodynamically significant by iFR (iFR = 0.71).  There is also a 99% stenosis of OM2 with faint left to left collaterals supplying the distal branch as well as sequential 60% distal RCA and 70-80% proximal RPDA lesions.  2. Upper normal to mildly elevated left heart filling pressure. 3. Normal right heart and pulmonary artery pressures. 4. Normal cardiac output/index.  Recommendations: 1. In the setting of multivessel coronary artery disease, diabetes mellitus, and reduced LVEF that appears to be due to ischemic cardiomyopathy, consultation with cardiac surgery for consideration of CABG is recommended. 2. Initiate aspirin 81 mg daily. 3. Increase carvedilol to 12.5 mg twice daily with further escalation of goal-directed medical therapy for chronic HFrEF as tolerated. 4. Aggressive secondary prevention of coronary artery disease.  Yvonne Kendall, MD [facility]    Recommendations  Antiplatelet/Anticoag Aspirin 81 mg daily pending cardiac surgery consultation.  Discharge Date In the absence of any other complications or medical issues, we expect the patient to be ready for discharge from a cath perspective on 04/14/2021.   Surgeon Notes    04/14/2021 11:40 AM Brief Op Note signed by Yvonne Kendall, MD    03/25/2021 10:39 AM GI Operative Note signed by [name], MD    03/25/2021 10:27 AM GI Operative  Note signed by [name], MD    Indications  Heart failure with reduced ejection fraction (HCC) [I50.20 (ICD-10-CM)]  Atypical chest pain [R07.89 (ICD-10-CM)]   Procedural Details  Technical Details Indication: 50 y.o. year-old woman with history of hypertension, hyperlipidemia, and uncontrolled diabetes mellitus, presenting for evaluation of atypical chest/shoulder pain and shortness of breath as well as recently diagnosed cardiomyopathy with LVEF of 35-40% by echo.  GFR: 48 ml/min  Procedure: The risks, benefits, complications, treatment options, and expected outcomes were discussed with the patient. The patient and/or family concurred with the proposed plan, giving informed consent. The patient was sedated with IV midazolam and fentanyl. The right wrist and elbow were prepped and draped in a sterile fashion. 1% lidocaine was used for local anesthesia. Ultrasound was used to evaluate the right basilic vein. It was patent.  A micropuncture needle was used to access the right basilic vein under ultrasound guidance. A 10F slender Glidesheath was inserted using modified Seldinger technique. Right heart catheterization was performed by advancing a 10F balloon-tipped catheter through the right heart chambers into the pulmonary capillary wedge position. Pressure measurements and oxygen saturations were obtained.  Thermodilution calculations were also performed.  Ultrasound was used to evaluate the right radial artery. It was patent.  A micropuncture needle was used to access the right radial artery under ultrasound guidance. Using the modified Seldinger access technique, a 966F slender Glidesheath was placed in the right radial artery. 3 mg Verapamil was given through the sheath. Heparin 4,500 units were administered.  Selective coronary angiography was performed using a 914F TIG4.0 catheter to engage the left coronary artery and a 914F TIG4.0 catheter to engage the right coronary artery. Left heart  catheterization was performed using a 914F angled pigtail catheter. Left ventriculogram was not performed.  iFR of LAD: Heparin was used for anticoagulation.  The left coronary artery was engaged with a 966F EBU 3.5 guide catheter and an Omni pressure wire advanced into the distal LAD after administration of intracoronary nitroglycerin.  iFR was measured (iFR 0.71) followed by iFR pullback from distal through ostial LAD.  iFR pullback showed step up in readings at all 3 mid/distal LAD lesions.  Final angiogram shows stable appearance of the LAD.  At the end of the procedure, the radial artery sheath was removed and a TR band applied to achieve patent hemostasis.  The basilic vein sheath was removed and hemostasis achieved with manual compression.  There were no  immediate complications. The patient was taken to the recovery area in stable condition.  Estimated blood loss <50 mL.   During this procedure medications were administered to achieve and maintain moderate conscious sedation while the patient's heart rate, blood pressure, and oxygen saturation were continuously monitored and I was present face-to-face 100% of this time.   Medications (Filter: Administrations occurring from 1010 to 1136 on 04/14/21)  Heparin (Porcine) in NaCl 1000-0.9 UT/500ML-% SOLN (mL) Total volume:  500 mL  Date/Time Rate/Dose/Volume Action   04/14/21 1024 500 mL Given    midazolam (VERSED) injection (mg) Total dose:  1 mg  Date/Time Rate/Dose/Volume Action   04/14/21 1029 1 mg Given    fentaNYL (SUBLIMAZE) injection (mcg) Total dose:  25 mcg  Date/Time Rate/Dose/Volume Action   04/14/21 1029 25 mcg Given    heparin sodium (porcine) injection (Units) Total dose:  9,000 Units  Date/Time Rate/Dose/Volume Action   04/14/21 1050 4,500 Units Given   1109 4,500 Units Given    verapamil (ISOPTIN) injection (mg) Total dose:  2.5 mg  Date/Time Rate/Dose/Volume Action   04/14/21 1048 2.5 mg Given    iohexol  (OMNIPAQUE) 300 MG/ML solution (mL) Total volume:  54 mL  Date/Time Rate/Dose/Volume Action   04/14/21 1128 54 mL Given     Sedation Time  Sedation Time Physician-1: 55 minutes 31 seconds   Contrast  Medication Name Total Dose  iohexol (OMNIPAQUE) 300 MG/ML solution 54 mL    Radiation/Fluoro  Fluoro time: 7.5 (min) DAP: 20.4 (Gycm2) Cumulative Air Kerma: 289 (mGy)   Complications   Complications documented before study signed (04/14/2021 8:17 PM)    No complications were associated with this study.  Documented by Yvonne Kendall, MD - 04/14/2021 8:10 PM     Coronary Findings   Diagnostic Dominance: Right  Left Main  Vessel is large. Vessel is angiographically normal.  Left Anterior Descending  Vessel is moderate in size. There is moderate diffuse disease throughout the vessel.  Prox LAD to Mid LAD lesion is 70% stenosed.  Mid LAD lesion is 60% stenosed.  Dist LAD lesion is 80% stenosed.  First Diagonal Branch  Vessel is small in size.  Second Diagonal Branch  Vessel is small in size.  Left Circumflex  Vessel is large.  Mid Cx lesion is 30% stenosed.  First Obtuse Marginal Branch  Vessel is large in size.  1st Mrg lesion is 40% stenosed.  Second Obtuse Marginal Branch  Vessel is moderate in size.  Collaterals  2nd Mrg filled by collaterals from 1st Mrg.    2nd Mrg lesion is 99% stenosed.  Right Coronary Artery  Vessel is moderate in size. There is mild diffuse disease throughout the vessel.  Dist RCA lesion is 60% stenosed.  Right Posterior Descending Artery  Vessel is moderate in size.  RPDA lesion is 75% stenosed.  Right Posterior Atrioventricular Artery  Vessel is small in size. There is moderate disease in the vessel.  First Right Posterolateral Branch  Vessel is small in size. There is moderate disease in the vessel.  Second Right Posterolateral Branch  Vessel is small in size. There is moderate disease in the vessel.    Intervention   No interventions have been documented.  Right Heart  Right Heart Pressures RA (mean): 8 mmHg RV (S/EDP): 30/10 mmHg PA (S/D, mean): 26/16 (21) mmHg PCWP (mean): 15 mmHg  Ao sat: 99% PA sat: 75%  Fick CO: 5.7 L/min Fick CI: 2.8 L/min/m^2  Thermodilution CO: 6.4 L/min Thermodilution CI:  3.2 L/min/m^2   Left Heart  Left Ventricle LV end diastolic pressure is mildly elevated. LVEDP 17 mmHg.  Aortic Valve There is no aortic valve stenosis.   Coronary Diagrams   Diagnostic Dominance: Right    Intervention    Implants    No implant documentation for this case.   Syngo Images  Show images for CARDIAC CATHETERIZATION  Images on Long Term Storage  Show images for Joletta, Manner to Procedure Log  Procedure Log     Hemo Data (last day) before discharge  AO Systolic Cath Pressure AO Diastolic Cath Pressure AO Mean Cath Pressure LV Systolic Cath Pressure LV End Diastolic LV Systolic LV End Diastolic LV dP/dt PA Systolic Cath Pressure PA Diastolic Cath Pressure PA Mean Cath Pressure RA Wedge A Wave RA Wedge V Wave RV Systolic Cath Pressure RV Diastolic Cath Pressure RV End Diastolic RV Systolic RV End Diastolic RV dP/dt PCW A Wave PCW V Wave PCW Mean AO O2 Sat PA O2 Sat AO O2 Sat Fick C.O. Fick C.I. TDCO TDCI  -- -- -- -- -- 155 mmHg 17 mmHg 1248 mmHg/sec -- -- -- 10 mmHg 7 mmHg -- -- -- 30 mmHg 10 mmHg 240 mmHg/sec 15 mmHg 15 mmHg 12 mmHg -- -- -- 5.7 L/min 2.82 L/min/m2 6.37 L/min 3.15 L/min/m2  -- -- -- -- -- -- -- -- -- -- -- -- -- 28 mmHg 6 mmHg 8 mmHg -- -- -- -- -- -- -- -- -- -- -- -- --  -- -- -- -- -- -- -- -- 24 mmHg 13 mmHg 18 mmHg -- -- -- -- -- -- -- -- -- -- -- -- -- -- -- -- -- --  -- -- -- -- -- -- -- -- -- -- -- -- -- -- -- -- -- -- -- -- -- -- -- -- -- -- -- 6.37 L/min --  153 77 mmHg 108 mmHg -- -- -- -- -- -- -- -- -- -- -- -- -- -- -- -- -- -- -- -- -- -- -- -- -- --  -- -- -- -- -- -- -- -- -- -- -- -- -- -- -- -- -- --  -- -- -- -- -- MV -- -- -- -- --  -- -- -- -- -- -- -- -- -- -- -- -- -- -- -- -- -- -- -- -- -- -- 98.7 % -- SA -- -- -- --  -- -- -- 163 mmHg 20 mmHg -- -- -- -- -- -- -- -- -- -- -- -- -- -- -- -- -- -- -- -- -- -- -- --  -- -- -- 161 mmHg 20 mmHg -- -- -- -- -- -- -- -- -- -- -- -- -- -- -- -- -- -- -- -- -- -- -- --  -- -- -- 155 mmHg 17 mmHg -- -- -- -- -- -- -- -- -- -- -- -- -- -- -- -- -- -- -- -- -- -- -- --  163 80 mmHg 114 mmHg -- -- -- -- -- -- -- -- -- -- -- -- -- -- -- -- -- -- -- -- -- -- -- -- -- --  -- -- -- -- -- -- -- -- 26 mmHg 16 mmHg 21 mmHg -- -- -- -- -- -- -- -- -- -- -- -- -- -- -- -- -- --  -- -- -- -- -- -- -- -- -- -- -- -- -- 30 mmHg 7 mmHg 10 mmHg -- -- -- -- -- -- -- -- -- -- -- -- --  147 79 mmHg 109 mmHg -- -- -- -- -- -- -- -- -- -- --                     ECHOCARDIOGRAM REPORT       Patient Name:  Sarah Duffy Date of Exam: 03/12/2021  Medical Rec #: [id number]   Height:    69.0 in  Accession #:  [id number]  Weight:    190.0 lb  Date of Birth: Oct 28, 1971  BSA:     2.021 m  Patient Age:  49 years   BP:      132/88 mmHg  Patient Gender: F       HR:      86 bpm.  Exam Location: [place]   Procedure: 2D Echo, 3D Echo, Cardiac Doppler, Color Doppler and Strain  Analysis   Indications:  R07.9* Chest pain, unspecified; I34.0 Nonrheumatic mitral         (valve) insufficiency    History:    Patient has no prior history of Echocardiogram  examinations.         Mitral Valve Disease, Signs/Symptoms:Chest Pain; Risk         Factors:Hypertension, Diabetes, Dyslipidemia, s/p Covid  2020 and         Non-Smoker.    Sonographer:  Quentin Ore RDMS, RVT, RDCS  Referring Phys: [id number] BRIAN AGBOR-ETANG   IMPRESSIONS    1. Left ventricular ejection fraction, by estimation, is 35 to 40%. The  left ventricle has moderately decreased function. The left ventricle   demonstrates global hypokinesis. Left ventricular diastolic parameters are  consistent with Grade I diastolic  dysfunction (impaired relaxation). The average left ventricular global  longitudinal strain is -13.5 %. The global longitudinal strain is  abnormal.  2. Right ventricular systolic function is normal. The right ventricular  size is normal.  3. Left atrial size was mildly dilated.  4. The mitral valve is normal in structure. Moderate mitral valve  regurgitation.   FINDINGS  Left Ventricle: Left ventricular ejection fraction, by estimation, is 35  to 40%. The left ventricle has moderately decreased function. The left  ventricle demonstrates global hypokinesis. The average left ventricular  global longitudinal strain is -13.5  %. The global longitudinal strain is abnormal. The left ventricular  internal cavity size was normal in size. There is no left ventricular  hypertrophy. Left ventricular diastolic parameters are consistent with  Grade I diastolic dysfunction (impaired  relaxation).   Right Ventricle: The right ventricular size is normal. No increase in  right ventricular wall thickness. Right ventricular systolic function is  normal.   Left Atrium: Left atrial size was mildly dilated.   Right Atrium: Right atrial size was normal in size.   Pericardium: There is no evidence of pericardial effusion.   Mitral Valve: The mitral valve is normal in structure. Moderate mitral  valve regurgitation. No evidence of mitral valve stenosis.   Tricuspid Valve: The tricuspid valve is normal in structure. Tricuspid  valve regurgitation is mild . No evidence of tricuspid stenosis.   Aortic Valve: The aortic valve is normal in structure. Aortic valve  regurgitation is not visualized. No aortic stenosis is present. Aortic  valve mean gradient measures 6.0 mmHg. Aortic valve peak gradient measures  9.6 mmHg. Aortic valve area, by VTI  measures 2.00 cm.   Pulmonic Valve: The  pulmonic valve was normal in structure. Pulmonic valve  regurgitation is not visualized. No evidence of pulmonic stenosis.   Aorta: The aortic root  is normal in size and structure.   Venous: The inferior vena cava is normal in size with greater than 50%  respiratory variability, suggesting right atrial pressure of 3 mmHg.   IAS/Shunts: No atrial level shunt detected by color flow Doppler.     LEFT VENTRICLE  PLAX 2D  LVIDd:     5.30 cm   Diastology  LVIDs:     4.70 cm   LV e' medial:  4.90 cm/s  LV PW:     0.80 cm   LV E/e' medial: 19.3  LV IVS:    0.80 cm   LV e' lateral:  7.72 cm/s  LVOT diam:   1.90 cm   LV E/e' lateral: 12.3  LV SV:     74  LV SV Index:  36      2D Longitudinal Strain  LVOT Area:   2.84 cm   2D Strain GLS Avg:   -13.5 %    LV Volumes (MOD)  LV vol d, MOD A2C: 125.0 ml  LV vol d, MOD A4C: 152.0 ml  LV vol s, MOD A2C: 75.6 ml  LV vol s, MOD A4C: 90.2 ml  LV SV MOD A2C:   49.4 ml  LV SV MOD A4C:   152.0 ml  LV SV MOD BP:   57.4 ml   RIGHT VENTRICLE  RV Basal diam: 3.30 cm  RV S prime:   12.10 cm/s   LEFT ATRIUM       Index    RIGHT ATRIUM      Index  LA diam:    4.40 cm 2.18 cm/m RA Area:   13.80 cm  LA Vol (A2C):  59.5 ml 29.43 ml/m RA Volume:  35.30 ml 17.46 ml/m  LA Vol (A4C):  73.5 ml 36.36 ml/m  LA Biplane Vol: 66.1 ml 32.70 ml/m  AORTIC VALVE          PULMONIC VALVE  AV Area (Vmax):  2.16 cm   PV Vmax:    0.84 m/s  AV Area (Vmean):  2.05 cm   PV Peak grad: 2.8 mmHg  AV Area (VTI):   2.00 cm  AV Vmax:      155.00 cm/s  AV Vmean:     118.000 cm/s  AV VTI:      0.368 m  AV Peak Grad:   9.6 mmHg  AV Mean Grad:   6.0 mmHg  LVOT Vmax:     118.00 cm/s  LVOT Vmean:    85.400 cm/s  LVOT VTI:     0.260 m  LVOT/AV VTI ratio: 0.71    AORTA  Ao Root diam: 2.70 cm  Ao Asc diam: 3.10 cm  Ao  Arch diam: 2.3 cm   MITRAL VALVE  MV Area (PHT): 5.16 cm   SHUNTS  MV Decel Time: 147 msec   Systemic VTI: 0.26 m  MV E velocity: 94.70 cm/s  Systemic Diam: 1.90 cm  MV A velocity: 111.00 cm/s  MV E/A ratio: 0.85   Julien Nordmannimothy Gollan MD  Electronically signed by Julien Nordmannimothy Gollan MD  Signature Date/Time: 03/13/2021/5:18:14 PM      Final    Impression:  This 50 year old poorly controlled diabetic has severe three-vessel coronary disease with moderate left ventricular systolic dysfunction.  I agree that coronary bypass graft surgery is the best treatment for her.  I reviewed her cardiac catheterization images with her and answered her questions. I discussed the operative procedure with the patient and family including alternatives, benefits and risks;  including but not limited to bleeding, blood transfusion, infection, stroke, myocardial infarction, graft failure, heart block requiring a permanent pacemaker, organ dysfunction, and death.  [name] understands and agrees to proceed.  I also discussed the importance of maximum cardiac risk factor reduction and preventing further coronary atherosclerosis.  Her hemoglobin A1c was 11.4 on 02/11/2021.  It was 12.7 in October 2020 and 13.0 in July 2020.  It was 12.9 in January 2019.   Plan:  CABG with possible left radial artery graft on Thursday 04/25/2019.  We will get preoperative upper extremity arterial Dopplers to decide if the left radial artery is suitable   Alleen Borne, MD [facility] [phone]

## 2021-04-17 ENCOUNTER — Ambulatory Visit: Payer: 59 | Admitting: Anesthesiology

## 2021-04-17 ENCOUNTER — Encounter
Admission: RE | Disposition: A | Discharge status: Home / Self Care | Payer: Self-pay | Source: Home / Self Care | Attending: Gastroenterology

## 2021-04-17 ENCOUNTER — Encounter: Payer: Self-pay | Admitting: Gastroenterology

## 2021-04-17 ENCOUNTER — Ambulatory Visit
Admission: RE | Admit: 2021-04-17 | Discharge: 2021-04-17 | Disposition: A | Discharge status: Home / Self Care | Payer: 59 | Attending: Gastroenterology | Admitting: Gastroenterology

## 2021-04-17 DIAGNOSIS — Z79899 Other long term (current) drug therapy: Secondary | ICD-10-CM | POA: Insufficient documentation

## 2021-04-17 DIAGNOSIS — Z7982 Long term (current) use of aspirin: Secondary | ICD-10-CM | POA: Diagnosis not present

## 2021-04-17 DIAGNOSIS — Z803 Family history of malignant neoplasm of breast: Secondary | ICD-10-CM | POA: Diagnosis not present

## 2021-04-17 DIAGNOSIS — R1310 Dysphagia, unspecified: Secondary | ICD-10-CM

## 2021-04-17 DIAGNOSIS — Z1211 Encounter for screening for malignant neoplasm of colon: Secondary | ICD-10-CM | POA: Diagnosis not present

## 2021-04-17 DIAGNOSIS — Z7984 Long term (current) use of oral hypoglycemic drugs: Secondary | ICD-10-CM | POA: Insufficient documentation

## 2021-04-17 HISTORY — PX: ESOPHAGOGASTRODUODENOSCOPY (EGD) WITH PROPOFOL: SHX5813

## 2021-04-17 HISTORY — DX: Essential (primary) hypertension: I10

## 2021-04-17 HISTORY — PX: COLONOSCOPY WITH PROPOFOL: SHX5780

## 2021-04-17 LAB — GLUCOSE, CAPILLARY: Glucose-Capillary: 267 mg/dL — ABNORMAL HIGH (ref 70–99)

## 2021-04-17 LAB — POCT PREGNANCY, URINE: Preg Test, Ur: NEGATIVE

## 2021-04-17 SURGERY — COLONOSCOPY WITH PROPOFOL
Anesthesia: General

## 2021-04-17 MED ORDER — SODIUM CHLORIDE 0.9 % IV SOLN: INTRAVENOUS | Status: DC

## 2021-04-17 MED ORDER — PROPOFOL 500 MG/50ML IV EMUL
INTRAVENOUS | Status: DC | PRN
  Administered 2021-04-17: 140 ug/kg/min via INTRAVENOUS

## 2021-04-17 MED ORDER — PROPOFOL 10 MG/ML IV BOLUS
INTRAVENOUS | Status: DC | PRN
  Administered 2021-04-17: 70 mg via INTRAVENOUS

## 2021-04-17 NOTE — H&P (Signed)
[name], MD [address], [place], [place], [address] [address], [address], [place], [place], [address] Phone: [phone]  Fax: [phone]  Primary Care Physician:  [name], MD   Pre-Procedure History & Physical: HPI:  Sarah Duffy is a 50 y.o. female is here for an colonoscopy.   Past Medical History:  Diagnosis Date  . Diabetes mellitus without complication (HCC)   . Hypertension   . MRSA infection within last 3 months 02/25/2016  . Osteomyelitis of foot (HCC) 08/26/2016    Past Surgical History:  Procedure Laterality Date  . CHOLECYSTECTOMY  1999  . COLONOSCOPY WITH PROPOFOL N/A 03/25/2021   Procedure: COLONOSCOPY WITH PROPOFOL;  Surgeon: [name], MD;  Location: [facility] ENDOSCOPY;  Service: Gastroenterology;  Laterality: N/A;  . ESOPHAGOGASTRODUODENOSCOPY (EGD) WITH PROPOFOL N/A 03/25/2021   Procedure: ESOPHAGOGASTRODUODENOSCOPY (EGD) WITH PROPOFOL;  Surgeon: [name], MD;  Location: [facility] ENDOSCOPY;  Service: Gastroenterology;  Laterality: N/A;  . RIGHT/LEFT HEART CATH AND CORONARY ANGIOGRAPHY Bilateral 04/14/2021   Procedure: RIGHT/LEFT HEART CATH AND CORONARY ANGIOGRAPHY;  Surgeon: Yvonne Kendall, MD;  Location: [facility] INVASIVE CV LAB;  Service: Cardiovascular;  Laterality: Bilateral;  . TOE SURGERY Left 02/07/2016   Pinky Toe    Prior to Admission medications   Medication Sig Start Date End Date Taking? Authorizing Provider  aspirin EC 81 MG tablet Take 1 tablet (81 mg total) by mouth daily. Swallow whole. 04/14/21 04/14/22 Yes End, Cristal Deer, MD  atorvastatin (LIPITOR) 40 MG tablet Take 1 tablet (40 mg total) by mouth daily. 02/11/21  Yes [name], Danna Hefty, MD  carvedilol (COREG) 6.25 MG tablet Take 2 tablets (12.5 mg total) by mouth 2 (two) times daily. 04/14/21  Yes End, Cristal Deer, MD  empagliflozin (JARDIANCE) 10 MG TABS tablet Take 1 tablet (10 mg total) by mouth daily. 02/11/21  Yes [name], Danna Hefty, MD  Multiple Vitamins-Minerals  (MULTIVITAMIN WITH MINERALS) tablet Take 1 tablet by mouth daily. 05/29/20  Yes Federico Flake, MD  omeprazole (PRILOSEC) 40 MG capsule Take 1 capsule (40 mg total) by mouth daily. 03/12/21  Yes [name], MD  sacubitril-valsartan (ENTRESTO) 24-26 MG Take 1 tablet by mouth 2 (two) times daily. 04/11/21  Yes Agbor-Etang, Arlys John, MD  albuterol (VENTOLIN HFA) 108 (90 Base) MCG/ACT inhaler Inhale 2 puffs into the lungs every 6 (six) hours as needed for wheezing or shortness of breath. 02/17/21   [name], MD  nitroGLYCERIN (NITROSTAT) 0.4 MG SL tablet Place 1 tablet (0.4 mg total) under the tongue every 5 (five) minutes as needed for chest pain. 04/14/21 04/14/22  Yvonne Kendall, MD    Allergies as of 04/11/2021  . (No Known Allergies)    Family History  Problem Relation Age of Onset  . Diabetes Mother   . Ulcers Mother   . Heart disease Father   . AAA (abdominal aortic aneurysm) Father   . Diabetes Father   . Hypertension Father   . Stroke Father   . Alzheimer's disease Father   . Heart attack Sister   . Seizures Brother   . Diabetes Maternal Grandmother   . Breast cancer Maternal Grandmother     Social History   Socioeconomic History  . Marital status: Single    Spouse name: Not on file  . Number of children: 1  . Years of education: Not on file  . Highest education level: Some college, no degree  Occupational History  . Occupation: Dealer   Tobacco Use  . Smoking status: Never  Smoker  . Smokeless tobacco: Never Used  Vaping Use  . Vaping Use: Never used  Substance and Sexual Activity  . Alcohol use: No    Alcohol/week: 0.0 standard drinks  . Drug use: No  . Sexual activity: Yes    Partners: Male    Birth control/protection: Injection  Other Topics Concern  . Not on file  Social History Narrative   She used to work at [employer], but  Feb 2017 she left work because of  MRSA infection.osteomyelitis and uncontrolled DM. She has been back to work  since Feb 2018   Lives alone   Social Determinants of Health   Financial Resource Strain: Not on file  Food Insecurity: Not on file  Transportation Needs: Not on file  Physical Activity: Not on file  Stress: Not on file  Social Connections: Not on file  Intimate Partner Violence: Not on file    Review of Systems: See HPI, otherwise negative ROS  Physical Exam: BP 115/75   Pulse 88   Temp (!) 96.7 F (35.9 C) (Temporal)   Resp 20   Ht 5\' 9"  (1.753 m)   Wt 86.6 kg   SpO2 100%   BMI 28.21 kg/m  General:   Alert,  pleasant and cooperative in NAD Head:  Normocephalic and atraumatic. Neck:  Supple; no masses or thyromegaly. Lungs:  Clear throughout to auscultation, normal respiratory effort.    Heart:  +S1, +S2, Regular rate and rhythm, No edema. Abdomen:  Soft, nontender and nondistended. Normal bowel sounds, without guarding, and without rebound.   Neurologic:  Alert and  oriented x4;  grossly normal neurologically.  Impression/Plan: Sarah Duffy is here for an colonoscopy to be performed for Screening colonoscopy average risk   Risks, benefits, limitations, and alternatives regarding  colonoscopy have been reviewed with the patient.  Questions have been answered.  All parties agreeable.   [name], MD  04/17/2021, 10:14 AM

## 2021-04-17 NOTE — Transfer of Care (Signed)
Immediate Anesthesia Transfer of Care Note  Patient: Wynonia Medero  Procedure(s) Performed: COLONOSCOPY WITH PROPOFOL (N/A ) ESOPHAGOGASTRODUODENOSCOPY (EGD) WITH PROPOFOL (N/A )  Patient Location: PACU  Anesthesia Type:General  Level of Consciousness: sedated  Airway & Oxygen Therapy: Patient Spontanous Breathing  Post-op Assessment: Report given to RN and Post -op Vital signs reviewed and stable  Post vital signs: Reviewed and stable  Last Vitals:  Vitals Value Taken Time  BP 89/54 04/17/21 1045  Temp 36.1 C 04/17/21 1043  Pulse 72 04/17/21 1045  Resp 20 04/17/21 1045  SpO2 100 % 04/17/21 1045  Vitals shown include unvalidated device data.  Last Pain:  Vitals:   04/17/21 1043  TempSrc:   PainSc: Asleep         Complications: No complications documented.

## 2021-04-17 NOTE — Anesthesia Postprocedure Evaluation (Signed)
Anesthesia Post Note  Patient: Sarah Duffy  Procedure(s) Performed: COLONOSCOPY WITH PROPOFOL (N/A ) ESOPHAGOGASTRODUODENOSCOPY (EGD) WITH PROPOFOL (N/A )  Patient location during evaluation: Endoscopy Anesthesia Type: General Level of consciousness: awake and alert and oriented Pain management: pain level controlled Vital Signs Assessment: post-procedure vital signs reviewed and stable Respiratory status: spontaneous breathing Cardiovascular status: blood pressure returned to baseline Anesthetic complications: no   No complications documented.   Last Vitals:  Vitals:   04/17/21 0950 04/17/21 1043  BP: 115/75 (!) 89/54  Pulse: 88   Resp: 20   Temp: (!) 36.1 C (!) 36.1 C  SpO2: 100%     Last Pain:  Vitals:   04/17/21 1103  TempSrc:   PainSc: 0-No pain                 Aspen Lawrance

## 2021-04-17 NOTE — Op Note (Addendum)
[facility] Patient Name: Sarah Duffy Procedure Date: 04/17/2021 10:16 AM MRN: [id number] Account #: [id number] Date of Birth: [birth year] Admit Type: Outpatient Age: 50 Room: [facility] ENDO ROOM 3 Gender: Female Note Status: Finalized Procedure:             Colonoscopy Indications:           Screening for colorectal malignant neoplasm Providers:             [name] MD, MD Referring MD:          Onnie Boer. [name], MD (Referring MD) Medicines:             Monitored Anesthesia Care Complications:         No immediate complications. Procedure:             Pre-Anesthesia Assessment:                        - Prior to the procedure, a History and Physical was                         performed, and patient medications, allergies and                         sensitivities were reviewed. The patient's tolerance                         of previous anesthesia was reviewed.                        - The risks and benefits of the procedure and the                         sedation options and risks were discussed with the                         patient. All questions were answered and informed                         consent was obtained.                        - ASA Grade Assessment: II - A patient with mild                         systemic disease.                        After obtaining informed consent, the colonoscope was                         passed under direct vision. Throughout the procedure,                         the patient's blood pressure, pulse, and oxygen                         saturations were monitored continuously. The                         Colonoscope was introduced through the anus  and                         advanced to the the cecum, identified by the                         appendiceal orifice. The colonoscopy was performed                         with ease. The patient tolerated the procedure well.                         The  quality of the bowel preparation was adequate. Findings:      The perianal and digital rectal examinations were normal.      The entire examined colon appeared normal on direct and retroflexion       views. Impression:            - The entire examined colon is normal on direct and                         retroflexion views.                        - No specimens collected. Recommendation:        - Discharge patient to home (with escort).                        - Resume previous diet.                        - Continue present medications.                        - Repeat colonoscopy in 10 years for screening                         purposes. Procedure Code(s):     --- Professional ---                        [phone], Colonoscopy, flexible; diagnostic, including                         collection of specimen(s) by brushing or washing, when                         performed (separate procedure) Diagnosis Code(s):     --- Professional ---                        Z12.11, Encounter for screening for malignant neoplasm                         of colon CPT copyright 2019 American Medical Association. All rights reserved. The codes documented in this report are preliminary and upon coder review may  be revised to meet current compliance requirements. [name], MD [name] MD, MD 04/17/2021 10:44:38 AM This report has been signed electronically. Number of Addenda: 0 Note Initiated On: 04/17/2021 10:16 AM Scope Withdrawal Time: 0 hours 11 minutes 41 seconds  Total Procedure Duration: 0 hours 16 minutes 48 seconds  Estimated Blood Loss:  Estimated blood loss: none.      [facility]

## 2021-04-17 NOTE — Anesthesia Preprocedure Evaluation (Signed)
Anesthesia Evaluation  Patient identified by MRN, date of birth, ID band Patient awake    Reviewed: Allergy & Precautions, H&P , NPO status , Patient's Chart, lab work & pertinent test results, reviewed documented beta blocker date and time   History of Anesthesia Complications Negative for: history of anesthetic complications  Airway Mallampati: I  TM Distance: >3 FB Neck ROM: full    Dental  (+) Dental Advidsory Given, Missing, Teeth Intact   Pulmonary neg pulmonary ROS,    Pulmonary exam normal breath sounds clear to auscultation       Cardiovascular Exercise Tolerance: Good hypertension, Normal cardiovascular exam+ Valvular Problems/Murmurs MR  Rhythm:regular Rate:Normal     Neuro/Psych  Headaches, PSYCHIATRIC DISORDERS Anxiety Depression    GI/Hepatic negative GI ROS, Neg liver ROS,   Endo/Other  diabetes  Renal/GU negative Renal ROS  negative genitourinary   Musculoskeletal   Abdominal   Peds negative pediatric ROS (+)  Hematology negative hematology ROS (+)   Anesthesia Other Findings Past Medical History: No date: Diabetes mellitus without complication (HCC) 02/25/2016: MRSA infection within last 3 months 08/26/2016: Osteomyelitis of foot (HCC)   Reproductive/Obstetrics negative OB ROS                             Anesthesia Physical  Anesthesia Plan  ASA: III  Anesthesia Plan: General   Post-op Pain Management:    Induction: Intravenous  PONV Risk Score and Plan: 3 and TIVA and Propofol infusion  Airway Management Planned: Natural Airway and Nasal Cannula  Additional Equipment:   Intra-op Plan:   Post-operative Plan:   Informed Consent: I have reviewed the patients History and Physical, chart, labs and discussed the procedure including the risks, benefits and alternatives for the proposed anesthesia with the patient or authorized representative who has indicated  his/her understanding and acceptance.     Dental Advisory Given  Plan Discussed with: Anesthesiologist, CRNA and Surgeon  Anesthesia Plan Comments:         Anesthesia Quick Evaluation

## 2021-04-18 ENCOUNTER — Encounter: Payer: Self-pay | Admitting: Gastroenterology

## 2021-04-22 ENCOUNTER — Ambulatory Visit (HOSPITAL_COMMUNITY)
Admission: RE | Admit: 2021-04-22 | Discharge: 2021-04-22 | Disposition: A | Discharge status: Home / Self Care | Payer: 59 | Source: Ambulatory Visit | Attending: Surgery | Admitting: Surgery

## 2021-04-22 ENCOUNTER — Encounter (HOSPITAL_COMMUNITY): Payer: Self-pay

## 2021-04-22 ENCOUNTER — Ambulatory Visit (HOSPITAL_COMMUNITY)
Admission: RE | Admit: 2021-04-22 | Discharge: 2021-04-22 | Disposition: A | Discharge status: Home / Self Care | Payer: 59 | Source: Ambulatory Visit | Attending: Family Medicine | Admitting: Family Medicine

## 2021-04-22 ENCOUNTER — Encounter (HOSPITAL_COMMUNITY)
Admission: RE | Admit: 2021-04-22 | Discharge: 2021-04-22 | Disposition: A | Discharge status: Home / Self Care | Payer: 59 | Source: Ambulatory Visit | Attending: Surgery | Admitting: Surgery

## 2021-04-22 ENCOUNTER — Other Ambulatory Visit: Payer: Self-pay

## 2021-04-22 DIAGNOSIS — I2510 Atherosclerotic heart disease of native coronary artery without angina pectoris: Secondary | ICD-10-CM | POA: Insufficient documentation

## 2021-04-22 DIAGNOSIS — Z01818 Encounter for other preprocedural examination: Secondary | ICD-10-CM | POA: Insufficient documentation

## 2021-04-22 HISTORY — DX: Atherosclerotic heart disease of native coronary artery without angina pectoris: I25.10

## 2021-04-22 HISTORY — DX: Dyspnea, unspecified: R06.00

## 2021-04-22 LAB — COMPREHENSIVE METABOLIC PANEL
ALT: 17 U/L (ref 0–44)
AST: 17 U/L (ref 15–41)
Albumin: 3.4 g/dL — ABNORMAL LOW (ref 3.5–5.0)
Alkaline Phosphatase: 49 U/L (ref 38–126)
Anion gap: 6 (ref 5–15)
BUN: 13 mg/dL (ref 6–20)
CO2: 19 mmol/L — ABNORMAL LOW (ref 22–32)
Calcium: 9 mg/dL (ref 8.9–10.3)
Chloride: 110 mmol/L (ref 98–111)
Creatinine, Ser: 0.93 mg/dL (ref 0.44–1.00)
GFR, Estimated: >60 mL/min (ref >60)
Glucose, Bld: 147 mg/dL — ABNORMAL HIGH (ref 70–99)
Potassium: 3.7 mmol/L (ref 3.5–5.1)
Sodium: 135 mmol/L (ref 135–145)
Total Bilirubin: 0.7 mg/dL (ref 0.3–1.2)
Total Protein: 6.9 g/dL (ref 6.5–8.1)

## 2021-04-22 LAB — SURGICAL PCR SCREEN
MRSA, PCR: NEGATIVE
Staphylococcus aureus: POSITIVE — AB

## 2021-04-22 LAB — GLUCOSE, CAPILLARY: Glucose-Capillary: 144 mg/dL — ABNORMAL HIGH (ref 70–99)

## 2021-04-22 LAB — APTT: aPTT: 24 seconds (ref 24–36)

## 2021-04-22 LAB — CBC
HCT: 31.3 % — ABNORMAL LOW (ref 36.0–46.0)
Hemoglobin: 10.5 g/dL — ABNORMAL LOW (ref 12.0–15.0)
MCH: 28.1 pg (ref 26.0–34.0)
MCHC: 33.5 g/dL (ref 30.0–36.0)
MCV: 83.7 fL (ref 80.0–100.0)
Platelets: 266 10*3/uL (ref 150–400)
RBC: 3.74 MIL/uL — ABNORMAL LOW (ref 3.87–5.11)
RDW: 13.6 % (ref 11.5–15.5)
WBC: 9.6 10*3/uL (ref 4.0–10.5)
nRBC: 0 % (ref 0.0–0.2)

## 2021-04-22 LAB — URINALYSIS, ROUTINE W REFLEX MICROSCOPIC
Bacteria, UA: NONE SEEN
Bilirubin Urine: NEGATIVE
Glucose, UA: >=500 mg/dL — AB
Ketones, ur: NEGATIVE mg/dL
Leukocytes,Ua: NEGATIVE
Nitrite: NEGATIVE
Protein, ur: 100 mg/dL — AB
Specific Gravity, Urine: 1.015 (ref 1.005–1.030)
pH: 5 (ref 5.0–8.0)

## 2021-04-22 LAB — HEMOGLOBIN A1C
Hgb A1c MFr Bld: 11.1 % — ABNORMAL HIGH (ref 4.8–5.6)
Mean Plasma Glucose: 271.87 mg/dL

## 2021-04-22 LAB — BLOOD GAS, ARTERIAL
Acid-base deficit: 5.8 mmol/L — ABNORMAL HIGH (ref 0.0–2.0)
Bicarbonate: 18.2 mmol/L — ABNORMAL LOW (ref 20.0–28.0)
Drawn by: 58793
FIO2: 21
O2 Saturation: 98.5 %
Patient temperature: 37
pCO2 arterial: 30.6 mmHg — ABNORMAL LOW (ref 32.0–48.0)
pH, Arterial: 7.392 (ref 7.350–7.450)
pO2, Arterial: 116 mmHg — ABNORMAL HIGH (ref 83.0–108.0)

## 2021-04-22 LAB — TYPE AND SCREEN
ABO/RH(D): AB POS
Antibody Screen: NEGATIVE

## 2021-04-22 LAB — PROTIME-INR
INR: 1 (ref 0.8–1.2)
Prothrombin Time: 12.9 seconds (ref 11.4–15.2)
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
## 2021-04-22 NOTE — Progress Notes (Signed)
Pre-CABG ultrasound study completed.  ° °Please see CV Proc for preliminary results.  ° °Izayah Miner, RDMS, RVT ° °

## 2021-04-22 NOTE — Progress Notes (Addendum)
PCP - [name], MD Cardiologist -  [name], MD    Chest x-ray - 5/40/22 EKG - 04/14/21 Stress Test - denies ECHO - 03/13/21 Cardiac Cath - 04/14/21  Fasting Blood Sugar - 193-246 Checks Blood Sugar _4____ times a day  Aspirin Instructions: LD 04/22/21   COVID TEST- pending   Anesthesia review: yes, hx DOS  Patient denies shortness of breath, fever, cough and chest pain at PAT appointment   All instructions explained to the patient, with a verbal understanding of the material. Patient agrees to go over the instructions while at home for a better understanding. Patient also instructed to self quarantine after being tested for COVID-19. The opportunity to ask questions was provided.

## 2021-04-22 NOTE — Progress Notes (Addendum)
Surgical Instructions    Your procedure is scheduled on May 12  Report to [facility] Main Entrance "A" at 0530 A.M., then check in with the Admitting office.  Call this number if you have problems the morning of surgery:  [phone]   If you have any questions prior to your surgery date call [phone]: Open Monday-Friday 8am-4pm    Remember:  Do not eat or drink after midnight the night before your surgery    Take these medicines the morning of surgery with A SIP OF WATER  albuterol (VENTOLIN HFA), Please bring all inhalers with you the day of surgery.  atorvastatin (LIPITOR)  carvedilol (COREG) omeprazole (PRILOSEC)  Follow your surgeon's instructions on when to stop Aspirin.  If no instructions were given by your surgeon then you will need to call the office to get those instructions.    As of today, STOP taking any  Aleve, Naproxen, Ibuprofen, Motrin, Advil, Goody's, BC's, all herbal medications, fish oil, and all vitamins.   BRING ALL SI PAPERWORK WITH YOU DAY OF PROCEDURE   WHAT DO I DO ABOUT MY DIABETES MEDICATION?  Do not take empagliflozin (JARDIANCE the morning  . Do not take oral diabetes medicines (pills) the morning of surgery. empagliflozin (JARDIANCE      . THE MORNING OF SURGERY, take _____9________ units of _Lantus_________insulin.   . If your CBG is greater than 220 mg/dL, you may take  of your sliding scale (correction) dose of insulin.   HOW TO MANAGE YOUR DIABETES BEFORE AND AFTER SURGERY  Why is it important to control my blood sugar before and after surgery? . Improving blood sugar levels before and after surgery helps healing and can limit problems. . A way of improving blood sugar control is eating a healthy diet by: o  Eating less sugar and carbohydrates o  Increasing activity/exercise o  Talking with your doctor about reaching your blood sugar goals . High blood sugars (greater than 180 mg/dL) can raise your risk of infections and slow  your recovery, so you will need to focus on controlling your diabetes during the weeks before surgery. . Make sure that the doctor who takes care of your diabetes knows about your planned surgery including the date and location.  How do I manage my blood sugar before surgery? . Check your blood sugar at least 4 times a day, starting 2 days before surgery, to make sure that the level is not too high or low. . Check your blood sugar the morning of your surgery when you wake up and every 2 hours until you get to the Short Stay unit. o If your blood sugar is less than 70 mg/dL, you will need to treat for low blood sugar: - Do not take insulin. - Treat a low blood sugar (less than 70 mg/dL) with  cup of clear juice (cranberry or apple), 4 glucose tablets, OR glucose gel. - Recheck blood sugar in 15 minutes after treatment (to make sure it is greater than 70 mg/dL). If your blood sugar is not greater than 70 mg/dL on recheck, call [phone] for further instructions. . Report your blood sugar to the short stay nurse when you get to Short Stay.  . If you are admitted to the hospital after surgery: o Your blood sugar will be checked by the staff and you will probably be given insulin after surgery (instead of oral diabetes medicines) to make sure you have good blood sugar levels. o The goal for blood sugar  control after surgery is 80-180 mg/dL.                      Do not wear jewelry, make up, or nail polish            Do not wear lotions, powders, perfumes, or deodorant.            Do not shave 48 hours prior to surgery.             Do not bring valuables to the hospital.            [facility] is not responsible for any belongings or valuables.  Do NOT Smoke (Tobacco/Vaping) or drink Alcohol 24 hours prior to your procedure If you use a CPAP at night, you may bring all equipment for your overnight stay.   Contacts, glasses, dentures or bridgework may not be worn into surgery, please bring  cases for these belongings   For patients admitted to the hospital, discharge time will be determined by your treatment team.   Patients discharged the day of surgery will not be allowed to drive home, and someone needs to stay with them for 24 hours.    Special instructions:   [facility]- Preparing For Surgery  Before surgery, you can play an important role. Because skin is not sterile, your skin needs to be as free of germs as possible. You can reduce the number of germs on your skin by washing with CHG (chlorahexidine gluconate) Soap before surgery.  CHG is an antiseptic cleaner which kills germs and bonds with the skin to continue killing germs even after washing.    Oral Hygiene is also important to reduce your risk of infection.  Remember - BRUSH YOUR TEETH THE MORNING OF SURGERY WITH YOUR REGULAR TOOTHPASTE  Please do not use if you have an allergy to CHG or antibacterial soaps. If your skin becomes reddened/irritated stop using the CHG.  Do not shave (including legs and underarms) for at least 48 hours prior to first CHG shower. It is OK to shave your face.  Please follow these instructions carefully.   1. Shower the NIGHT BEFORE SURGERY and the MORNING OF SURGERY  2. If you chose to wash your hair, wash your hair first as usual with your normal shampoo.  3. After you shampoo, rinse your hair and body thoroughly to remove the shampoo.  4. Wash Face and genitals (private parts) with your normal soap.   5.  Shower the NIGHT BEFORE SURGERY and the MORNING OF SURGERY with CHG Soap.   6. Use CHG Soap as you would any other liquid soap. You can apply CHG directly to the skin and wash gently with a scrungie or a clean washcloth.   7. Apply the CHG Soap to your body ONLY FROM THE NECK DOWN.  Do not use on open wounds or open sores. Avoid contact with your eyes, ears, mouth and genitals (private parts). Wash Face and genitals (private parts)  with your normal soap.   8. Wash  thoroughly, paying special attention to the area where your surgery will be performed.  9. Thoroughly rinse your body with warm water from the neck down.  10. DO NOT shower/wash with your normal soap after using and rinsing off the CHG Soap.  11. Pat yourself dry with a CLEAN TOWEL.  12. Wear CLEAN PAJAMAS to bed the night before surgery  13. Place CLEAN SHEETS on your bed the night before your  surgery  14. DO NOT SLEEP WITH PETS.   Day of Surgery: Take a shower with CHG soap. Wear Clean/Comfortable clothing the morning of surgery Do not apply any deodorants/lotions.   Remember to brush your teeth WITH YOUR REGULAR TOOTHPASTE.   Please read over the following fact sheets that you were given.

## 2021-04-23 ENCOUNTER — Encounter (HOSPITAL_COMMUNITY): Payer: Self-pay

## 2021-04-23 ENCOUNTER — Other Ambulatory Visit: Payer: Self-pay | Admitting: *Deleted

## 2021-04-23 ENCOUNTER — Encounter: Payer: Self-pay | Admitting: *Deleted

## 2021-04-23 DIAGNOSIS — I2510 Atherosclerotic heart disease of native coronary artery without angina pectoris: Secondary | ICD-10-CM

## 2021-04-23 MED ORDER — DEXMEDETOMIDINE HCL IN NACL 400 MCG/100ML IV SOLN
0.1000 ug/kg/h | INTRAVENOUS | Status: AC
  Filled 2021-04-23: qty 100

## 2021-04-23 MED ORDER — CEFAZOLIN SODIUM-DEXTROSE 2-4 GM/100ML-% IV SOLN
2.0000 g | INTRAVENOUS | Status: AC
  Filled 2021-04-23: qty 100

## 2021-04-23 MED ORDER — EPINEPHRINE HCL 5 MG/250ML IV SOLN IN NS
0.0000 ug/min | INTRAVENOUS | Status: AC
  Filled 2021-04-23: qty 250

## 2021-04-23 MED ORDER — MAGNESIUM SULFATE 50 % IJ SOLN
40.0000 meq | INTRAMUSCULAR | Status: AC
  Filled 2021-04-23: qty 9.85

## 2021-04-23 MED ORDER — TRANEXAMIC ACID 1000 MG/10ML IV SOLN
1.5000 mg/kg/h | INTRAVENOUS | Status: AC
  Filled 2021-04-23: qty 25

## 2021-04-23 MED ORDER — NOREPINEPHRINE 4 MG/250ML-% IV SOLN
0.0000 ug/min | INTRAVENOUS | Status: AC
Start: 2021-04-24 — End: 2021-04-25
  Filled 2021-04-23: qty 250

## 2021-04-23 MED ORDER — VANCOMYCIN HCL 1500 MG/300ML IV SOLN
1500.0000 mg | INTRAVENOUS | Status: AC
  Filled 2021-04-23: qty 300

## 2021-04-23 MED ORDER — PLASMA-LYTE 148 IV SOLN
INTRAVENOUS | Status: AC
  Filled 2021-04-23: qty 2.5

## 2021-04-23 MED ORDER — NITROGLYCERIN IN D5W 200-5 MCG/ML-% IV SOLN
2.0000 ug/min | INTRAVENOUS | Status: AC
Start: 2021-04-24 — End: 2021-04-25
  Filled 2021-04-23: qty 250

## 2021-04-23 MED ORDER — INSULIN REGULAR(HUMAN) IN NACL 100-0.9 UT/100ML-% IV SOLN
INTRAVENOUS | Status: AC
  Filled 2021-04-23: qty 100

## 2021-04-23 MED ORDER — POTASSIUM CHLORIDE 2 MEQ/ML IV SOLN
80.0000 meq | INTRAVENOUS | Status: AC
  Filled 2021-04-23: qty 40

## 2021-04-23 MED ORDER — PHENYLEPHRINE HCL-NACL 20-0.9 MG/250ML-% IV SOLN
30.0000 ug/min | INTRAVENOUS | Status: AC
  Filled 2021-04-23: qty 250

## 2021-04-23 MED ORDER — SODIUM CHLORIDE 0.9 % IV SOLN
INTRAVENOUS | Status: AC
  Filled 2021-04-23: qty 30

## 2021-04-23 MED ORDER — TRANEXAMIC ACID (OHS) BOLUS VIA INFUSION
15.0000 mg/kg | INTRAVENOUS | Status: AC
  Filled 2021-04-23: qty 1328

## 2021-04-23 MED ORDER — TRANEXAMIC ACID (OHS) PUMP PRIME SOLUTION
2.0000 mg/kg | INTRAVENOUS | Status: AC
  Filled 2021-04-23: qty 1.77

## 2021-04-23 MED ORDER — MILRINONE LACTATE IN DEXTROSE 20-5 MG/100ML-% IV SOLN
0.3000 ug/kg/min | INTRAVENOUS | Status: AC
  Filled 2021-04-23: qty 100

## 2021-04-23 NOTE — H&P (Signed)
[address]       [place] [address]             [phone]      Cardiothoracic Surgery Admission History and Physical   PCP is [name], MD Referring Provider is [name], MD      Chief Complaint  Patient presents with  . Coronary Artery Disease        HPI:  The patient is a 50 year old woman with a history of hypertension, hyperlipidemia, and diabetes who developed left-sided chest discomfort as well as pain in her left shoulder and upper back in early March.  The symptoms lasted for several days and she has had no recurrence.  She denies any shortness of breath.  She has not noticed some decreased energy level and dizzy spells.  She denies orthopnea and peripheral edema.  She was seen by cardiology on 02/21/2021.  2D echocardiogram on 03/12/2021 showed moderate LV dysfunction with ejection fraction of 35 to 40% with global hypokinesis.  There was moderate mitral regurgitation.  She subsequently underwent cardiac catheterization on 04/14/2021 showing severe multivessel coronary disease.  The LAD had 70% proximal to mid stenosis, 60% mid stenosis, and 80% distal stenosis.  The left circumflex had a moderate sized second marginal branch that had 99% stenosis.  The right coronary artery was diffusely diseased with 60% distal stenosis.  The PDA had 75% proximal stenosis.  Right heart catheterization showed PA pressure of 26/16 with a wedge pressure of 15.  Right atrial pressure was 8.  Cardiac index was 2.8 and PA sat was 75%.  The patient is single and lives alone.  She is a non-smoker.  She works at [employer].     Past Medical History:  Diagnosis Date  . Diabetes mellitus without complication (HCC)   . MRSA infection within last 3 months 02/25/2016  . Osteomyelitis of foot (HCC) 08/26/2016         Past Surgical History:  Procedure Laterality Date  . CHOLECYSTECTOMY  1999  . COLONOSCOPY WITH PROPOFOL N/A 03/25/2021   Procedure:  COLONOSCOPY WITH PROPOFOL;  Surgeon: [name], MD;  Location: [facility] ENDOSCOPY;  Service: Gastroenterology;  Laterality: N/A;  . ESOPHAGOGASTRODUODENOSCOPY (EGD) WITH PROPOFOL N/A 03/25/2021   Procedure: ESOPHAGOGASTRODUODENOSCOPY (EGD) WITH PROPOFOL;  Surgeon: [name], MD;  Location: [facility] ENDOSCOPY;  Service: Gastroenterology;  Laterality: N/A;  . RIGHT/LEFT HEART CATH AND CORONARY ANGIOGRAPHY Bilateral 04/14/2021   Procedure: RIGHT/LEFT HEART CATH AND CORONARY ANGIOGRAPHY;  Surgeon: Yvonne Kendall, MD;  Location: [facility] INVASIVE CV LAB;  Service: Cardiovascular;  Laterality: Bilateral;  . TOE SURGERY Left 02/07/2016   Pinky Toe         Family History  Problem Relation Age of Onset  . Diabetes Mother   . Ulcers Mother   . Heart disease Father   . AAA (abdominal aortic aneurysm) Father   . Diabetes Father   . Hypertension Father   . Stroke Father   . Alzheimer's disease Father   . Heart attack Sister   . Seizures Brother   . Diabetes Maternal Grandmother   . Breast cancer Maternal Grandmother     Social History Social History        Tobacco Use  . Smoking status: Never Smoker  . Smokeless tobacco: Never Used  Vaping Use  . Vaping Use: Never used  Substance Use Topics  . Alcohol use: No    Alcohol/week: 0.0 standard drinks  . Drug use: No  Current Outpatient Medications  Medication Sig Dispense Refill  . albuterol (VENTOLIN HFA) 108 (90 Base) MCG/ACT inhaler Inhale 2 puffs into the lungs every 6 (six) hours as needed for wheezing or shortness of breath. 8 g 0  . aspirin EC 81 MG tablet Take 1 tablet (81 mg total) by mouth daily. Swallow whole.    Marland Kitchen atorvastatin (LIPITOR) 40 MG tablet Take 1 tablet (40 mg total) by mouth daily. 90 tablet 1  . carvedilol (COREG) 6.25 MG tablet Take 2 tablets (12.5 mg total) by mouth 2 (two) times daily. 60 tablet 5  . empagliflozin (JARDIANCE) 10 MG TABS tablet Take 1 tablet (10 mg total) by mouth  daily. 30 tablet 2  . Multiple Vitamins-Minerals (MULTIVITAMIN WITH MINERALS) tablet Take 1 tablet by mouth daily. 100 tablet 0  . nitroGLYCERIN (NITROSTAT) 0.4 MG SL tablet Place 1 tablet (0.4 mg total) under the tongue every 5 (five) minutes as needed for chest pain. 25 tablet prn  . omeprazole (PRILOSEC) 40 MG capsule Take 1 capsule (40 mg total) by mouth daily. 90 capsule 3  . sacubitril-valsartan (ENTRESTO) 24-26 MG Take 1 tablet by mouth 2 (two) times daily. 60 tablet 0   No current facility-administered medications for this visit.    No Known Allergies  Review of Systems  Constitutional: Positive for fatigue. Negative for activity change.  HENT: Negative.   Eyes: Negative.   Respiratory: Negative for shortness of breath.   Cardiovascular: Positive for chest pain. Negative for leg swelling.  Gastrointestinal: Negative.   Endocrine: Negative.   Genitourinary: Negative.   Musculoskeletal: Negative.   Allergic/Immunologic: Negative.   Neurological: Positive for dizziness. Negative for syncope.  Hematological: Negative.   Psychiatric/Behavioral: Negative.     BP 109/74 (BP Location: Left Arm, Patient Position: Sitting)   Pulse 88   Resp 20   Ht  (1.753 m)   SpO2 96% Comment: RA  BMI 28.21 kg/m  Physical Exam Constitutional:      Appearance: Normal appearance. She is normal weight.  HENT:     Head: Normocephalic and atraumatic.  Eyes:     Conjunctiva/sclera: Conjunctivae normal.     Pupils: Pupils are equal, round, and reactive to light.  Neck:     Vascular: No carotid bruit.  Cardiovascular:     Rate and Rhythm: Normal rate and regular rhythm.     Pulses: Normal pulses.     Heart sounds: Normal heart sounds. No murmur heard.   Pulmonary:     Effort: Pulmonary effort is normal.     Breath sounds: Normal breath sounds.  Abdominal:     Tenderness: There is no abdominal tenderness.  Musculoskeletal:        General: No swelling.     Cervical back:  Normal range of motion and neck supple. No tenderness.  Skin:    General: Skin is warm and dry.  Neurological:     General: No focal deficit present.     Mental Status: She is alert and oriented to person, place, and time.  Psychiatric:        Mood and Affect: Mood normal.        Behavior: Behavior normal.      Diagnostic Tests:  Physicians  Panel Physicians Referring Physician Case Authorizing Physician  End, Cristal Deer, MD (Primary)      Procedures  RIGHT/LEFT HEART CATH AND CORONARY ANGIOGRAPHY   Conclusion  Conclusions: 1. Multivessel coronary artery disease including mild to moderate diffuse plaquing of the LAD and  RCA. The mid/distal LAD demonstrates 3 focal lesions of up to 80% that are hemodynamically significant by iFR (iFR = 0.71). There is also a 99% stenosis of OM2 with faint left to left collaterals supplying the distal branch as well as sequential 60% distal RCA and 70-80% proximal RPDA lesions. 2. Upper normal to mildly elevated left heart filling pressure. 3. Normal right heart and pulmonary artery pressures. 4. Normal cardiac output/index.  Recommendations: 1. In the setting of multivessel coronary artery disease, diabetes mellitus, and reduced LVEF that appears to be due to ischemic cardiomyopathy, consultation with cardiac surgery for consideration of CABG is recommended. 2. Initiate aspirin 81 mg daily. 3. Increase carvedilol to 12.5 mg twice daily with further escalation of goal-directed medical therapy for chronic HFrEF as tolerated. 4. Aggressive secondary prevention of coronary artery disease.  Yvonne Kendallhristopher End, MD [facility]    Recommendations  Antiplatelet/Anticoag Aspirin 81 mg daily pending cardiac surgery consultation.  Discharge Date In the absence of any other complications or medical issues, we expect the patient to be ready for discharge from a cath perspective on 04/14/2021.   Surgeon Notes    04/14/2021  11:40 AM Brief Op Note signed by Yvonne KendallEnd, Christopher, MD    03/25/2021 10:39 AM GI Operative Note signed by Wyline MoodAnna, Kiran, MD    03/25/2021 10:27 AM GI Operative Note signed by Wyline MoodAnna, Kiran, MD    Indications  Heart failure with reduced ejection fraction (HCC) [I50.20 (ICD-10-CM)]  Atypical chest pain [R07.89 (ICD-10-CM)]   Procedural Details  Technical Details Indication: 50 y.o. year-old woman with history of hypertension, hyperlipidemia, and uncontrolled diabetes mellitus, presenting for evaluation of atypical chest/shoulder pain and shortness of breath as well as recently diagnosed cardiomyopathy with LVEF of 35-40% by echo.  GFR: 48 ml/min  Procedure: The risks, benefits, complications, treatment options, and expected outcomes were discussed with the patient. The patient and/or family concurred with the proposed plan, giving informed consent. The patient was sedated with IV midazolam and fentanyl. The right wrist and elbow were prepped and draped in a sterile fashion. 1% lidocaine was used for local anesthesia. Ultrasound was used to evaluate the right basilic vein. It was patent. A micropuncture needle was used to access the right basilic vein under ultrasound guidance. A 24F slender Glidesheath was inserted using modified Seldinger technique. Right heart catheterization was performed by advancing a 24F balloon-tipped catheter through the right heart chambers into the pulmonary capillary wedge position. Pressure measurements and oxygen saturations were obtained. Thermodilution calculations were also performed.  Ultrasound was used to evaluate the right radial artery. It was patent. A micropuncture needle was used to access the right radial artery under ultrasound guidance. Using the modified Seldinger access technique, a 72F slender Glidesheath was placed in the right radial artery. 3 mg Verapamil was given through the sheath. Heparin 4,500 units were administered. Selective coronary  angiography was performed using a 71F TIG4.0 catheter to engage the left coronary artery and a 71F TIG4.0 catheter to engage the right coronary artery. Left heart catheterization was performed using a 71F angled pigtail catheter. Left ventriculogram was not performed.  iFR of LAD: Heparin was used for anticoagulation. The left coronary artery was engaged with a 72F EBU 3.5 guide catheter and an Omni pressure wire advanced into the distal LAD after administration of intracoronary nitroglycerin. iFR was measured (iFR 0.71) followed by iFR pullback from distal through ostial LAD. iFR pullback showed step up in readings at all 3 mid/distal LAD lesions. Final angiogram shows stable  appearance of the LAD.  At the end of the procedure, the radial artery sheath was removed and a TR band applied to achieve patent hemostasis. The basilic vein sheath was removed and hemostasis achieved with manual compression. There were no immediate complications. The patient was taken to the recovery area in stable condition.  Estimated blood loss <50 mL.   During this procedure medications were administered to achieve and maintain moderate conscious sedation while the patient's heart rate, blood pressure, and oxygen saturation were continuously monitored and I was present face-to-face 100% of this time.   Medications (Filter: Administrations occurring from 1010 to 1136 on 04/14/21)  Heparin (Porcine) in NaCl 1000-0.9 UT/500ML-% SOLN (mL) Total volume:  500 mL  Date/Time Rate/Dose/Volume Action   04/14/21 1024 500 mL Given    midazolam (VERSED) injection (mg) Total dose:  1 mg  Date/Time Rate/Dose/Volume Action   04/14/21 1029 1 mg Given    fentaNYL (SUBLIMAZE) injection (mcg) Total dose:  25 mcg  Date/Time Rate/Dose/Volume Action   04/14/21 1029 25 mcg Given    heparin sodium (porcine) injection (Units) Total dose:  9,000 Units  Date/Time Rate/Dose/Volume Action   04/14/21 1050 4,500  Units Given   1109 4,500 Units Given    verapamil (ISOPTIN) injection (mg) Total dose:  2.5 mg  Date/Time Rate/Dose/Volume Action   04/14/21 1048 2.5 mg Given    iohexol (OMNIPAQUE) 300 MG/ML solution (mL) Total volume:  54 mL  Date/Time Rate/Dose/Volume Action   04/14/21 1128 54 mL Given     Sedation Time  Sedation Time Physician-1: 55 minutes 31 seconds   Contrast  Medication Name Total Dose  iohexol (OMNIPAQUE) 300 MG/ML solution 54 mL    Radiation/Fluoro  Fluoro time: 7.5 (min) DAP: 20.4 (Gycm2) Cumulative Air Kerma: 289 (mGy)   Complications   Complications documented before study signed (04/14/2021 8:17 PM)    No complications were associated with this study.  Documented by Yvonne Kendall, MD - 04/14/2021 8:10 PM     Coronary Findings   Diagnostic Dominance: Right  Left Main  Vessel is large. Vessel is angiographically normal.  Left Anterior Descending  Vessel is moderate in size. There is moderate diffuse disease throughout the vessel.  Prox LAD to Mid LAD lesion is 70% stenosed.  Mid LAD lesion is 60% stenosed.  Dist LAD lesion is 80% stenosed.  First Diagonal Branch  Vessel is small in size.  Second Diagonal Branch  Vessel is small in size.  Left Circumflex  Vessel is large.  Mid Cx lesion is 30% stenosed.  First Obtuse Marginal Branch  Vessel is large in size.  1st Mrg lesion is 40% stenosed.  Second Obtuse Marginal Branch  Vessel is moderate in size.  Collaterals  2nd Mrg filled by collaterals from 1st Mrg.    2nd Mrg lesion is 99% stenosed.  Right Coronary Artery  Vessel is moderate in size. There is mild diffuse disease throughout the vessel.  Dist RCA lesion is 60% stenosed.  Right Posterior Descending Artery  Vessel is moderate in size.  RPDA lesion is 75% stenosed.  Right Posterior Atrioventricular Artery  Vessel is small in size. There is moderate disease in the vessel.  First Right  Posterolateral Branch  Vessel is small in size. There is moderate disease in the vessel.  Second Right Posterolateral Branch  Vessel is small in size. There is moderate disease in the vessel.   Intervention   No interventions have been documented.  Right Heart  Right Heart Pressures  RA (mean): 8 mmHg RV (S/EDP): 30/10 mmHg PA (S/D, mean): 26/16 (21) mmHg PCWP (mean): 15 mmHg  Ao sat: 99% PA sat: 75%  Fick CO: 5.7 L/min Fick CI: 2.8 L/min/m^2  Thermodilution CO: 6.4 L/min Thermodilution CI: 3.2 L/min/m^2   Left Heart  Left Ventricle LV end diastolic pressure is mildly elevated. LVEDP 17 mmHg.  Aortic Valve There is no aortic valve stenosis.   Coronary Diagrams   Diagnostic Dominance: Right    Intervention    Implants       No implant documentation for this case.   Syngo Images  Show images for CARDIAC CATHETERIZATION  Images on Long Term Storage  Show images for Yemaya, Barnier to Procedure Log  Procedure Log     Hemo Data (last day) before discharge  AO Systolic Cath Pressure AO Diastolic Cath Pressure AO Mean Cath Pressure LV Systolic Cath Pressure LV End Diastolic LV Systolic LV End Diastolic LV dP/dt PA Systolic Cath Pressure PA Diastolic Cath Pressure PA Mean Cath Pressure RA Wedge A Wave RA Wedge V Wave RV Systolic Cath Pressure RV Diastolic Cath Pressure RV End Diastolic RV Systolic RV End Diastolic RV dP/dt PCW A Wave PCW V Wave PCW Mean AO O2 Sat PA O2 Sat AO O2 Sat Fick C.O. Fick C.I. TDCO TDCI  -- -- -- -- -- 155 mmHg 17 mmHg 1248 mmHg/sec -- -- -- 10 mmHg 7 mmHg -- -- -- 30 mmHg 10 mmHg 240 mmHg/sec 15 mmHg 15 mmHg 12 mmHg -- -- -- 5.7 L/min 2.82 L/min/m2 6.37 L/min 3.15 L/min/m2  -- -- -- -- -- -- -- -- -- -- -- -- -- 28 mmHg 6 mmHg 8 mmHg -- -- -- -- -- -- -- -- -- -- -- -- --  -- -- -- -- -- -- -- -- 24 mmHg 13 mmHg 18 mmHg -- -- -- -- -- -- -- -- -- -- -- -- -- -- -- -- -- --  -- -- -- -- -- -- -- -- --  -- -- -- -- -- -- -- -- -- -- -- -- -- -- -- -- -- -- 6.37 L/min --  153 77 mmHg 108 mmHg -- -- -- -- -- -- -- -- -- -- -- -- -- -- -- -- -- -- -- -- -- -- -- -- -- --  -- -- -- -- -- -- -- -- -- -- -- -- -- -- -- -- -- -- -- -- -- -- -- MV -- -- -- -- --  -- -- -- -- -- -- -- -- -- -- -- -- -- -- -- -- -- -- -- -- -- -- 98.7 % -- SA -- -- -- --  -- -- -- 163 mmHg 20 mmHg -- -- -- -- -- -- -- -- -- -- -- -- -- -- -- -- -- -- -- -- -- -- -- --  -- -- -- 161 mmHg 20 mmHg -- -- -- -- -- -- -- -- -- -- -- -- -- -- -- -- -- -- -- -- -- -- -- --  -- -- -- 155 mmHg 17 mmHg -- -- -- -- -- -- -- -- -- -- -- -- -- -- -- -- -- -- -- -- -- -- -- --  163 80 mmHg 114 mmHg -- -- -- -- -- -- -- -- -- -- -- -- -- -- -- -- -- -- -- -- -- -- -- -- -- --  -- -- -- -- -- -- -- -- 26  mmHg 16 mmHg 21 mmHg -- -- -- -- -- -- -- -- -- -- -- -- -- -- -- -- -- --  -- -- -- -- -- -- -- -- -- -- -- -- -- 30 mmHg 7 mmHg 10 mmHg -- -- -- -- -- -- -- -- -- -- -- -- --  147 79 mmHg 109 mmHg -- -- -- -- -- -- -- -- -- -- --                     ECHOCARDIOGRAM REPORT       Patient Name:  Sarah Duffy Date of Exam: 03/12/2021  Medical Rec #: [id number]   Height:    69.0 in  Accession #:  [id number]  Weight:    190.0 lb  Date of Birth: 1971/01/17  BSA:     2.021 m  Patient Age:  49 years   BP:      132/88 mmHg  Patient Gender: F       HR:      86 bpm.  Exam Location: [place]   Procedure: 2D Echo, 3D Echo, Cardiac Doppler, Color Doppler and Strain  Analysis   Indications:  R07.9* Chest pain, unspecified; I34.0 Nonrheumatic mitral         (valve) insufficiency    History:    Patient has no prior history of Echocardiogram  examinations.         Mitral Valve Disease, Signs/Symptoms:Chest Pain; Risk         Factors:Hypertension, Diabetes, Dyslipidemia, s/p Covid  2020 and         Non-Smoker.    Sonographer:   Quentin Ore RDMS, RVT, RDCS  Referring Phys: [id number] BRIAN AGBOR-ETANG   IMPRESSIONS    1. Left ventricular ejection fraction, by estimation, is 35 to 40%. The  left ventricle has moderately decreased function. The left ventricle  demonstrates global hypokinesis. Left ventricular diastolic parameters are  consistent with Grade I diastolic  dysfunction (impaired relaxation). The average left ventricular global  longitudinal strain is -13.5 %. The global longitudinal strain is  abnormal.  2. Right ventricular systolic function is normal. The right ventricular  size is normal.  3. Left atrial size was mildly dilated.  4. The mitral valve is normal in structure. Moderate mitral valve  regurgitation.   FINDINGS  Left Ventricle: Left ventricular ejection fraction, by estimation, is 35  to 40%. The left ventricle has moderately decreased function. The left  ventricle demonstrates global hypokinesis. The average left ventricular  global longitudinal strain is -13.5  %. The global longitudinal strain is abnormal. The left ventricular  internal cavity size was normal in size. There is no left ventricular  hypertrophy. Left ventricular diastolic parameters are consistent with  Grade I diastolic dysfunction (impaired  relaxation).   Right Ventricle: The right ventricular size is normal. No increase in  right ventricular wall thickness. Right ventricular systolic function is  normal.   Left Atrium: Left atrial size was mildly dilated.   Right Atrium: Right atrial size was normal in size.   Pericardium: There is no evidence of pericardial effusion.   Mitral Valve: The mitral valve is normal in structure. Moderate mitral  valve regurgitation. No evidence of mitral valve stenosis.   Tricuspid Valve: The tricuspid valve is normal in structure. Tricuspid  valve regurgitation is mild . No evidence of tricuspid stenosis.   Aortic Valve: The aortic valve is normal in structure. Aortic  valve  regurgitation is not visualized. No aortic  stenosis is present. Aortic  valve mean gradient measures 6.0 mmHg. Aortic valve peak gradient measures  9.6 mmHg. Aortic valve area, by VTI  measures 2.00 cm.   Pulmonic Valve: The pulmonic valve was normal in structure. Pulmonic valve  regurgitation is not visualized. No evidence of pulmonic stenosis.   Aorta: The aortic root is normal in size and structure.   Venous: The inferior vena cava is normal in size with greater than 50%  respiratory variability, suggesting right atrial pressure of 3 mmHg.   IAS/Shunts: No atrial level shunt detected by color flow Doppler.     LEFT VENTRICLE  PLAX 2D  LVIDd:     5.30 cm   Diastology  LVIDs:     4.70 cm   LV e' medial:  4.90 cm/s  LV PW:     0.80 cm   LV E/e' medial: 19.3  LV IVS:    0.80 cm   LV e' lateral:  7.72 cm/s  LVOT diam:   1.90 cm   LV E/e' lateral: 12.3  LV SV:     74  LV SV Index:  36      2D Longitudinal Strain  LVOT Area:   2.84 cm   2D Strain GLS Avg:   -13.5 %    LV Volumes (MOD)  LV vol d, MOD A2C: 125.0 ml  LV vol d, MOD A4C: 152.0 ml  LV vol s, MOD A2C: 75.6 ml  LV vol s, MOD A4C: 90.2 ml  LV SV MOD A2C:   49.4 ml  LV SV MOD A4C:   152.0 ml  LV SV MOD BP:   57.4 ml   RIGHT VENTRICLE  RV Basal diam: 3.30 cm  RV S prime:   12.10 cm/s   LEFT ATRIUM       Index    RIGHT ATRIUM      Index  LA diam:    4.40 cm 2.18 cm/m RA Area:   13.80 cm  LA Vol (A2C):  59.5 ml 29.43 ml/m RA Volume:  35.30 ml 17.46 ml/m  LA Vol (A4C):  73.5 ml 36.36 ml/m  LA Biplane Vol: 66.1 ml 32.70 ml/m  AORTIC VALVE          PULMONIC VALVE  AV Area (Vmax):  2.16 cm   PV Vmax:    0.84 m/s  AV Area (Vmean):  2.05 cm   PV Peak grad: 2.8 mmHg  AV Area (VTI):   2.00 cm  AV Vmax:      155.00 cm/s  AV Vmean:     118.000 cm/s  AV VTI:      0.368 m  AV Peak  Grad:   9.6 mmHg  AV Mean Grad:   6.0 mmHg  LVOT Vmax:     118.00 cm/s  LVOT Vmean:    85.400 cm/s  LVOT VTI:     0.260 m  LVOT/AV VTI ratio: 0.71    AORTA  Ao Root diam: 2.70 cm  Ao Asc diam: 3.10 cm  Ao Arch diam: 2.3 cm   MITRAL VALVE  MV Area (PHT): 5.16 cm   SHUNTS  MV Decel Time: 147 msec   Systemic VTI: 0.26 m  MV E velocity: 94.70 cm/s  Systemic Diam: 1.90 cm  MV A velocity: 111.00 cm/s  MV E/A ratio: 0.85   Julien Nordmann MD  Electronically signed by Julien Nordmann MD  Signature Date/Time: 03/13/2021/5:18:14 PM      Final    PREOPERATIVE VASCULAR EVALUATION  Patient Name: XITLALIC MASLIN Date of Exam:  04/22/2021  Medical Rec #: [id number]   Accession #:  [id number]  Date of Birth: [birth year]   Patient Gender: F  Patient Age:  52Y  Exam Location: [facility]  Procedure:   VAS US DOPPLER PRE CABG  Referring Phys: [id number] Carina Chaplin K Nabeeha Badertscher    ---------------------------------------------------------------------------  -----    Indications:   Pre-CABG.  Risk Factors:   Hypertension, hyperlipidemia, Diabetes.  Comparison Study: No prior studies.   Performing Technologist: Jean Rosenthal RDMS RVT     Examination Guidelines: A complete evaluation includes B-mode imaging,  spectral  Doppler, color Doppler, and power Doppler as needed of all accessible  portions  of each vessel. Bilateral testing is considered an integral part of a  complete  examination. Limited examinations for reoccurring indications may be  performed  as noted.     Right Carotid Findings:  +----------+--------+--------+--------+-----------------+--------+       PSV cm/sEDV cm/sStenosisDescribe     Comments  +----------+--------+--------+--------+-----------------+--------+  CCA Prox 128   22                      +----------+--------+--------+--------+-----------------+--------+   CCA Distal84   22                      +----------+--------+--------+--------+-----------------+--------+  ICA Prox 56   20   1-39%  heterogenous mild      +----------+--------+--------+--------+-----------------+--------+  ICA Distal109   43                      +----------+--------+--------+--------+-----------------+--------+  ECA    115   13                      +----------+--------+--------+--------+-----------------+--------+   Portions of this table do not appear on this page.     +----------+--------+-------+----------------+------------+       PSV cm/sEDV cmsDescribe    Arm Pressure  +----------+--------+-------+----------------+------------+  Subclavian192       Multiphasic, WNL        +----------+--------+-------+----------------+------------+   +---------+--------+--+--------+--+---------+  VertebralPSV cm/s89EDV cm/s24Antegrade  +---------+--------+--+--------+--+---------+   Left Carotid Findings:  +----------+--------+-------+--------+--------------------------------+----  ----+       PSV cm/sEDV  StenosisDescribe              Comments           cm/s                             +----------+--------+-------+--------+--------------------------------+----  ----+  CCA Prox 143   27                              +----------+--------+-------+--------+--------------------------------+----  ----+  CCA Distal90   23       heterogenous                 +----------+--------+-------+--------+--------------------------------+----  ----+  ICA Prox 68   29   1-39%  focal, heterogenous and                             irregular                    +----------+--------+-------+--------+--------------------------------+----  ----+  ICA Distal96   37                              +----------+--------+-------+--------+--------------------------------+----  ----+  ECA    158   31                              +----------+--------+-------+--------+--------------------------------+----  ----+    +----------+--------+--------+----------------+------------+  SubclavianPSV cm/sEDV cm/sDescribe    Arm Pressure  +----------+--------+--------+----------------+------------+       152       Multiphasic, WNL        +----------+--------+--------+----------------+------------+   +---------+--------+--+--------+--+---------+  VertebralPSV cm/s53EDV cm/s19Antegrade  +---------+--------+--+--------+--+---------+     ABI Findings:  +---------+------------------+-----+---------+--------+  Right  Rt Pressure (mmHg)IndexWaveform Comment   +---------+------------------+-----+---------+--------+  Brachial 170           triphasic      +---------+------------------+-----+---------+--------+  PTA   255        1.50 triphasic      +---------+------------------+-----+---------+--------+  DP    255        1.50 triphasic      +---------+------------------+-----+---------+--------+  Great Toe135        0.79 Normal        +---------+------------------+-----+---------+--------+   +---------+------------------+-----+---------+-------+  Left   Lt Pressure (mmHg)IndexWaveform Comment  +---------+------------------+-----+---------+-------+  Brachial 164           triphasic      +---------+------------------+-----+---------+-------+  PTA   255        1.50 triphasic       +---------+------------------+-----+---------+-------+  DP    255        1.50 triphasic      +---------+------------------+-----+---------+-------+  Great Toe163        0.96 Normal        +---------+------------------+-----+---------+-------+   +-------+---------------+----------------+  ABI/TBIToday's ABI/TBIPrevious ABI/TBI  +-------+---------------+----------------+  Right [place]/0.79              +-------+---------------+----------------+  Left  /0.96              +-------+---------------+----------------+  Arterial wall calcification precludes accurate ankle pressures and ABIs.    Right Doppler Findings:  +--------+--------+-----+---------+--------+  Site  PressureIndexDoppler Comments  +--------+--------+-----+---------+--------+  Brachial170      triphasic      +--------+--------+-----+---------+--------+  Radial        triphasic      +--------+--------+-----+---------+--------+  Ulnar         triphasic      +--------+--------+-----+---------+--------+      Left Doppler Findings:  +--------+--------+-----+---------+--------+  Site  PressureIndexDoppler Comments  +--------+--------+-----+---------+--------+  [id number]      triphasic      +--------+--------+-----+---------+--------+  Radial        triphasic      +--------+--------+-----+---------+--------+  Ulnar         triphasic      +--------+--------+-----+---------+--------+      Summary:  Right Carotid: Velocities in the right ICA are consistent with a 1-39%  stenosis.   Left Carotid: Velocities in the left ICA are consistent with a 1-39%  stenosis.  Vertebrals: Bilateral vertebral arteries demonstrate antegrade flow.  Subclavians: Normal flow hemodynamics were seen in bilateral subclavian        arteries.    Right ABI: Resting right ankle-brachial index indicates noncompressible  right lower extremity arteries. ABIs are unreliable. RT great toe pressure  = 135 mmHg.  Left ABI: Resting left ankle-brachial index indicates noncompressible left  lower extremity arteries. ABIs are unreliable. LT Great toe pressure = 163  mmHg.  Right Upper Extremity: Doppler waveforms decrease 50% with right radial  compression. Doppler waveforms remain within normal limits with right  ulnar compression.  Left Upper Extremity: Doppler waveforms remain within normal limits with  left radial compression. Doppler waveforms decrease <50% with left ulnar  compression.       Electronically signed by Gretta Began MD on 04/22/2021 at 3:46:17 PM.       Impression:  This 50 year old poorly controlled diabetic has severe three-vessel coronary disease with moderate left ventricular systolic dysfunction.  I agree that coronary bypass graft surgery is the best treatment for her.  I reviewed her cardiac catheterization images with her and answered her questions. I discussed the operative procedure with the patient including alternatives, benefits and risks; including but not limited to bleeding, blood transfusion, infection, stroke, myocardial infarction, graft failure, heart block requiring a permanent pacemaker, organ dysfunction, and death.  Sarah Duffy understands and agrees to proceed.    Plan:  CABG with possible left radial artery graft.  Alleen Borne, MD [facility] [phone]

## 2021-04-24 ENCOUNTER — Inpatient Hospital Stay (HOSPITAL_COMMUNITY): Payer: 59

## 2021-04-28 ENCOUNTER — Ambulatory Visit: Payer: 59 | Admitting: Medical

## 2021-04-28 NOTE — Progress Notes (Signed)
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 gas, arterial on room air     Status: Abnormal   Collection Time: 04/22/21  4:10 PM  Result Value Ref Range   FIO2 21.00    pH, Arterial 7.392 7.350 - 7.450   pCO2 arterial 30.6 (L) 32.0 - 48.0 mmHg   pO2, Arterial 116 (H) 83.0 - 108.0 mmHg   Bicarbonate 18.2 (L) 20.0 - 28.0 mmol/L   Acid-base deficit 5.8 (H) 0.0 - 2.0 mmol/L   O2 Saturation 98.5 %   Patient temperature 37.0    Collection site LEFT BRACHIAL    Drawn by [phone]    Sample type ARTERIAL     Comment: Performed at [facility] [address], [place], [place] [address]  Type and screen     Status: None   Collection Time: 04/22/21  4:10 PM  Result Value Ref Range   ABO/RH(D) AB POS    Antibody Screen NEG    Sample Expiration       04/25/2021,2359 Performed at [facility], [place] [address] [place], [place] [address]   CBC     Status: Abnormal   Collection Time: 04/22/21  4:30 PM  Result Value Ref Range   WBC 9.6 4.0 -  10.5 K/uL   RBC 3.74 (L) 3.87 - 5.11 MIL/uL   Hemoglobin 10.5 (L) 12.0 - 15.0 g/dL   HCT 31.3 (L) 36.0 - 46.0 %   MCV 83.7 80.0 - 100.0 fL   MCH 28.1 26.0 - 34.0 pg   MCHC 33.5 30.0 - 36.0 g/dL   RDW 13.6 11.5 - 15.5 %   Platelets 266 150 - 400 K/uL   nRBC 0.0 0.0 - 0.2 %    Comment: Performed at [facility] [address], [place], [place] [address]  Comprehensive metabolic panel     Status: Abnormal   Collection Time: 04/22/21  4:30 PM  Result Value Ref Range   Sodium 135 135 - 145 mmol/L   Potassium 3.7 3.5 - 5.1 mmol/L   Chloride 110 98 - 111 mmol/L   CO2 19 (L) 22 - 32 mmol/L   Glucose, Bld 147 (H) 70 - 99 mg/dL    Comment: Glucose reference range applies only to samples taken after fasting for at least 8 hours.   BUN 13 6 - 20 mg/dL   Creatinine, Ser 0.93 0.44 - 1.00 mg/dL   Calcium 9.0 8.9 - 10.3 mg/dL   Total Protein 6.9 6.5 - 8.1 g/dL   Albumin 3.4 (L) 3.5 - 5.0 g/dL   AST 17 15 - 41 U/L   ALT 17 0 - 44 U/L   Alkaline Phosphatase 49 38 - 126 U/L   Total Bilirubin 0.7 0.3 - 1.2 mg/dL   GFR, Estimated >60 >60 mL/min    Comment: (NOTE) Calculated using the CKD-EPI Creatinine Equation (2021)    Anion gap 6 5 - 15    Comment: Performed at [facility] [address], [place], [place] [address]  Protime-INR     Status: None   Collection Time: 04/22/21  4:30 PM  Result Value Ref Range   Prothrombin Time 12.9 11.4 - 15.2 seconds   INR 1.0 0.8 - 1.2    Comment: (NOTE) INR goal varies based on device and disease states. Performed at [facility], [place] [address], [place], [place] [address]   APTT     Status: None   Collection Time: 04/22/21  4:30 PM  Result Value Ref Range   aPTT 24 24 - 36 seconds    Comment: Performed at [facility] [address] [place], [place] [address]  Urinalysis, Routine w reflex microscopic     Status: Abnormal   Collection Time: 04/22/21  4:30 PM  Result Value Ref Range   Color, Urine STRAW (A) YELLOW   APPearance CLEAR CLEAR   Specific Gravity, Urine 1.015 1.005 - 1.030   pH 5.0 5.0 - 8.0   Glucose, UA >=500 (A) NEGATIVE mg/dL   Hgb urine dipstick SMALL (A) NEGATIVE   Bilirubin Urine NEGATIVE NEGATIVE   Ketones, ur NEGATIVE NEGATIVE mg/dL   Protein, ur 100 (A) NEGATIVE mg/dL   Nitrite NEGATIVE NEGATIVE   Leukocytes,Ua NEGATIVE NEGATIVE   RBC / HPF 0-5 0 - 5 RBC/hpf   WBC, UA 0-5 0 - 5 WBC/hpf   Bacteria, UA NONE SEEN NONE SEEN   Squamous Epithelial / LPF 0-5 0 - 5    Comment: Performed at [facility] [address], [place], [place] [address]  Hemoglobin A1c     Status: Abnormal   Collection Time: 04/22/21  4:30 PM  Result Value Ref Range   Hgb A1c MFr Bld 11.1 (H) 4.8 - 5.6 %  Comment: (NOTE) Pre diabetes:          5.7%-6.4%  Diabetes:              >6.4%  Glycemic control for   <7.0% adults with diabetes    Mean Plasma Glucose 271.87 mg/dL    Comment: Performed at [facility] [address], [place], [place] [address]    Diabetic Foot Exam: Diabetic Foot Exam - Simple   Simple Foot Form Diabetic Foot exam was performed with the following findings: Yes 04/29/2021  8:47 AM  Visual Inspection See comments: Yes Sensation Testing See comments: Yes Pulse Check Posterior Tibialis and Dorsalis pulse intact bilaterally: Yes Comments Some callus formation, bunion bilaterally, failed monofilament test      Fall Risk: Fall Risk  04/29/2021 02/17/2021 02/11/2021 09/25/2019 08/02/2019  Falls in the past year? 0 0 1 0 0  Number falls in past yr: 0 0 0 0 0  Injury with Fall? 0 0 1 0 0  Follow up - Falls prevention discussed - - Falls evaluation completed     Functional Status Survey: Is the patient deaf or have difficulty hearing?: No Does the patient have  difficulty seeing, even when wearing glasses/contacts?: No Does the patient have difficulty concentrating, remembering, or making decisions?: No Does the patient have difficulty walking or climbing stairs?: No Does the patient have difficulty dressing or bathing?: No Does the patient have difficulty doing errands alone such as visiting a doctor's office or shopping?: No   Assessment & Plan  1. Cervical cancer screening  - Cytology - PAP  2. Well adult exam   3. Need for shingles vaccine  - Varicella-zoster vaccine IM   4. Monilial vulvitis  - fluconazole (DIFLUCAN) 150 MG tablet; Take 1 tablet (150 mg total) by mouth every other day.  Dispense: 3 tablet; Refill: 0  -USPSTF grade A and B recommendations reviewed with patient; age-appropriate recommendations, preventive care, screening tests, etc discussed and encouraged; healthy living encouraged; see AVS for patient education given to patient -Discussed importance of 150 minutes of physical activity weekly, eat two servings of fish weekly, eat one serving of tree nuts ( cashews, pistachios, pecans, almonds.Marland Kitchen) every other day, eat 6 servings of fruit/vegetables daily and drink plenty of water and avoid sweet beverages.

## 2021-04-28 NOTE — Patient Instructions (Signed)
Preventive Care 52-52 Years Old, Female Preventive care refers to lifestyle choices and visits with your health care provider that can promote health and wellness. This includes:  A yearly physical exam. This is also called an annual wellness visit.  Regular dental and eye exams.  Immunizations.  Screening for certain conditions.  Healthy lifestyle choices, such as: ? Eating a healthy diet. ? Getting regular exercise. ? Not using drugs or products that contain nicotine and tobacco. ? Limiting alcohol use. What can I expect for my preventive care visit? Physical exam Your health care provider will check your:  Height and weight. These may be used to calculate your BMI (body mass index). BMI is a measurement that tells if you are at a healthy weight.  Heart rate and blood pressure.  Body temperature.  Skin for abnormal spots. Counseling Your health care provider may ask you questions about your:  Past medical problems.  Family's medical history.  Alcohol, tobacco, and drug use.  Emotional well-being.  Home life and relationship well-being.  Sexual activity.  Diet, exercise, and sleep habits.  Work and work Statistician.  Access to firearms.  Method of birth control.  Menstrual cycle.  Pregnancy history. What immunizations do I need? Vaccines are usually given at various ages, according to a schedule. Your health care provider will recommend vaccines for you based on your age, medical history, and lifestyle or other factors, such as travel or where you work.   What tests do I need? Blood tests  Lipid and cholesterol levels. These may be checked every 5 years, or more often if you are over 3 years old.  Hepatitis C test.  Hepatitis B test. Screening  Lung cancer screening. You may have this screening every year starting at age 52 if you have a 30-pack-year history of smoking and currently smoke or have quit within the past 15 years.  Colorectal cancer  screening. ? All adults should have this screening starting at age 52 and continuing until age 17 continuing until age 17. ? Your health care provider may recommend screening at age 49 if you are at increased risk. ? You will have tests every 1-10 years, depending on your results and the type of screening test.  Diabetes screening. ? This is done by checking your blood sugar (glucose) after you have not eaten for a while (fasting). ? You may have this done every 1-3 years.  Mammogram. ? This may be done every 1-2 years. ? Talk with your health care provider about when you should start having regular mammograms. This may depend on whether you have a family history of breast cancer.  BRCA-related cancer screening. This may be done if you have a family history of breast, ovarian, tubal, or peritoneal cancers.  Pelvic exam and Pap test. ? This may be done every 3 years starting at age 10. ? Starting at age 11, this may be done every 5 years if you have a Pap test in combination with an HPV test. Other tests  STD (sexually transmitted disease) testing, if you are at risk.  Bone density scan. This is done to screen for osteoporosis. You may have this scan if you are at high risk for osteoporosis. Talk with your health care provider about your test results, treatment options, and if necessary, the need for more tests. Follow these instructions at home: Eating and drinking  Eat a diet that includes fresh fruits and vegetables, whole grains, lean protein, and low-fat dairy products.  Take vitamin and mineral supplements  as recommended by your health care provider.  Do not drink alcohol if: ? Your health care provider tells you not to drink. ? You are pregnant, may be pregnant, or are planning to become pregnant.  If you drink alcohol: ? Limit how much you have to 0-1 drink a day. ? Be aware of how much alcohol is in your drink. In the [place] one drink equals one 12 oz bottle of beer (355 mL), one 5 oz glass of  wine (148 mL), or one 1 oz glass of hard liquor (44 mL).   Lifestyle  Take daily care of your teeth and gums. Brush your teeth every morning and night with fluoride toothpaste. Floss one time each day.  Stay active. Exercise for at least 30 minutes 5 or more days each week.  Do not use any products that contain nicotine or tobacco, such as cigarettes, e-cigarettes, and chewing tobacco. If you need help quitting, ask your health care provider.  Do not use drugs.  If you are sexually active, practice safe sex. Use a condom or other form of protection to prevent STIs (sexually transmitted infections).  If you do not wish to become pregnant, use a form of birth control. If you plan to become pregnant, see your health care provider for a prepregnancy visit.  If told by your health care provider, take low-dose aspirin daily starting at age 50.  Find healthy ways to cope with stress, such as: ? Meditation, yoga, or listening to music. ? Journaling. ? Talking to a trusted person. ? Spending time with friends and family. Safety  Always wear your seat belt while driving or riding in a vehicle.  Do not drive: ? If you have been drinking alcohol. Do not ride with someone who has been drinking. ? When you are tired or distracted. ? While texting.  Wear a helmet and other protective equipment during sports activities.  If you have firearms in your house, make sure you follow all gun safety procedures. What's next?  Visit your health care provider once a year for an annual wellness visit.  Ask your health care provider how often you should have your eyes and teeth checked.  Stay up to date on all vaccines. This information is not intended to replace advice given to you by your health care provider. Make sure you discuss any questions you have with your health care provider. Document Revised: 09/03/2020 Document Reviewed: 08/11/2018 Elsevier Patient Education  2021 Elsevier Inc.  

## 2021-04-28 NOTE — Progress Notes (Deleted)
Cardiology Office Note:    Date:  04/28/2021   ID:  Sarah Duffy, DOB [birth year], MRN [id number]  PCP:  [name], MD  [facility] Cardiologist:  [name], MD  [facility] Electrophysiologist:  None   Referring MD: [name], MD   Chief Complaint: Cardiac catheterization Follow-up  History of Present Illness:    Sarah Duffy is a 50 y.o. female with a hx of hypertension, hyperlipidemia, and diabetes presents for post catheterization follow-up.  Patient was seen for chest pain and an echo was ordered.  Echo showed EF 35 to 80%, she was set up for cardiac catheterization.  Also started on Coreg and Entresto.  Cardiac cath showed multivessel CAD and she was recommended for surgical consult for possible CABG.  Patient saw Dr. Laneta Simmers 04/23/2021 and plan to proceed with CABG.  Today    Past Medical History:  Diagnosis Date  . Coronary artery disease   . COVID-19 2021  . Diabetes mellitus without complication (HCC)   . Dyspnea    occasionally   . Hypertension   . MRSA infection within last 3 months 02/25/2016  . Osteomyelitis of foot (HCC) 08/26/2016    Past Surgical History:  Procedure Laterality Date  . CHOLECYSTECTOMY  1999  . COLONOSCOPY WITH PROPOFOL N/A 03/25/2021   Procedure: COLONOSCOPY WITH PROPOFOL;  Surgeon: [name], MD;  Location: [facility] ENDOSCOPY;  Service: Gastroenterology;  Laterality: N/A;  . COLONOSCOPY WITH PROPOFOL N/A 04/17/2021   Procedure: COLONOSCOPY WITH PROPOFOL;  Surgeon: [name], MD;  Location: [facility] ENDOSCOPY;  Service: Gastroenterology;  Laterality: N/A;  . ESOPHAGOGASTRODUODENOSCOPY (EGD) WITH PROPOFOL N/A 03/25/2021   Procedure: ESOPHAGOGASTRODUODENOSCOPY (EGD) WITH PROPOFOL;  Surgeon: [name], MD;  Location: [facility] ENDOSCOPY;  Service: Gastroenterology;  Laterality: N/A;  . ESOPHAGOGASTRODUODENOSCOPY (EGD) WITH PROPOFOL N/A 04/17/2021   Procedure: ESOPHAGOGASTRODUODENOSCOPY (EGD) WITH PROPOFOL;  Surgeon: [name], MD;   Location[facility] END[facility]COPY;  Service: Gastroenterology;  Laterality: N/A;  . RIGHT/LEFT HEART CATH AND CORONARY ANGIOGRAPHY Bilateral 04/14/2021   Procedure: RIGHT/LEFT HEART CATH AND CORONARY ANGIOGRAPHY;  Surgeon: Yvonne Kendall, MD;  Location: [facility] INVASIVE CV LAB;  Service: Cardiovascular;  Laterality: Bilateral;  . TOE SURGERY Left 02/07/2016   Pinky Toe    Current Medications: No outpatient medications have been marked as taking for the 04/28/21 encounter (Appointment) with Fransico Michael, Alexsa Flaum H, PA-C.     Allergies:   Patient has no known allergies.   Social History   Socioeconomic History  . Marital status: Single    Spouse name: Not on file  . Number of children: 1  . Years of education: Not on file  . Highest education level: Some college, no degree  Occupational History  . Occupation: Dealer   Tobacco Use  . Smoking status: Never Smoker  . Smokeless tobacco: Never Used  Vaping Use  . Vaping Use: Never used  Substance and Sexual Activity  . Alcohol use: No    Alcohol/week: 0.0 standard drinks  . Drug use: No  . Sexual activity: Yes    Partners: Male    Birth control/protection: Injection  Other Topics Concern  . Not on file  Social History Narrative   She used to work at [employer], but  Feb 2017 she left work because of  MRSA infection.osteomyelitis and uncontrolled DM. She has been back to work since Feb 2018   Lives alone   Social Determinants of Health   Financial Resource Strain: Not on file  Food Insecurity: Not on file  Transportation  Needs: Not on file  Physical Activity: Not on file  Stress: Not on file  Social Connections: Not on file     Family History: The patient's ***family history includes AAA (abdominal aortic aneurysm) in her father; Alzheimer's disease in her father; Breast cancer in her maternal grandmother; Diabetes in her father, maternal grandmother, and mother; Heart attack in her sister; Heart disease in her father;  Hypertension in her father; Seizures in her brother; Stroke in her father; Ulcers in her mother.  ROS:   Please see the history of present illness.    *** All other systems reviewed and are negative.  EKGs/Labs/Other Studies Reviewed:    The following studies were reviewed today:  Cardiac cath 04/14/21 Conclusions: 1. Multivessel coronary artery disease including mild to moderate diffuse plaquing of the LAD and RCA.  The mid/distal LAD demonstrates 3 focal lesions of up to 80% that are hemodynamically significant by iFR (iFR = 0.71).  There is also a 99% stenosis of OM2 with faint left to left collaterals supplying the distal branch as well as sequential 60% distal RCA and 70-80% proximal RPDA lesions. 2. Upper normal to mildly elevated left heart filling pressure. 3. Normal right heart and pulmonary artery pressures. 4. Normal cardiac output/index.  Recommendations: 1. In the setting of multivessel coronary artery disease, diabetes mellitus, and reduced LVEF that appears to be due to ischemic cardiomyopathy, consultation with cardiac surgery for consideration of CABG is recommended. 2. Initiate aspirin 81 mg daily. 3. Increase carvedilol to 12.5 mg twice daily with further escalation of goal-directed medical therapy for chronic HFrEF as tolerated. 4. Aggressive secondary prevention of coronary artery disease.  Yvonne Kendall, MD [facility]   Echo 1. Left ventricular ejection fraction, by estimation, is 35 to 40%. The  left ventricle has moderately decreased function. The left ventricle  demonstrates global hypokinesis. Left ventricular diastolic parameters are  consistent with Grade I diastolic  dysfunction (impaired relaxation). The average left ventricular global  longitudinal strain is -13.5 %. The global longitudinal strain is  abnormal.  2. Right ventricular systolic function is normal. The right ventricular  size is normal.  3. Left atrial size was mildly dilated.   4. The mitral valve is normal in structure. Moderate mitral valve  regurgitation.   EKG:  EKG is *** ordered today.  The ekg ordered today demonstrates ***  Recent Labs: 04/22/2021: ALT 17; BUN 13; Creatinine, Ser 0.93; Hemoglobin 10.5; Platelets 266; Potassium 3.7; Sodium 135  Recent Lipid Panel    Component Value Date/Time   CHOL 297 (H) 02/11/2021 1433   CHOL 208 (H) 02/28/2016 0951   TRIG 86 02/11/2021 1433   HDL 48 (L) 02/11/2021 1433   HDL 42 02/28/2016 0951   CHOLHDL 6.2 (H) 02/11/2021 1433   LDLCALC 229 (H) 02/11/2021 1433     Risk Assessment/Calculations:   {Does this patient have ATRIAL FIBRILLATION?:[id number]}   Physical Exam:    VS:  There were no vitals taken for this visit.    Wt Readings from Last 3 Encounters:  04/22/21 195 lb (88.5 kg)  04/17/21 191 lb (86.6 kg)  04/14/21 191 lb (86.6 kg)     GEN: *** Well nourished, well developed in no acute distress HEENT: Normal NECK: No JVD; No carotid bruits LYMPHATICS: No lymphadenopathy CARDIAC: ***RRR, no murmurs, rubs, gallops RESPIRATORY:  Clear to auscultation without rales, wheezing or rhonchi  ABDOMEN: Soft, non-tender, non-distended MUSCULOSKELETAL:  No edema; No deformity  SKIN: Warm and dry NEUROLOGIC:  Alert and  oriented x 3 PSYCHIATRIC:  Normal affect   ASSESSMENT:    No diagnosis found. PLAN:    In order of problems listed above:  Multivessel CAD  Hypertension  Hyperlipidemia   Disposition: Follow up {follow up:15908} with ***   Shared Decision Making/Informed Consent   {Are you ordering a CV Procedure (e.g. stress test, cath, DCCV, TEE, etc)?   Press F2        :[id number]}    Signed, Tymel Conely Ardelle Lesches  04/28/2021 7:42 AM    [facility]

## 2021-04-29 ENCOUNTER — Other Ambulatory Visit (HOSPITAL_COMMUNITY)
Admission: RE | Admit: 2021-04-29 | Discharge: 2021-04-29 | Disposition: A | Discharge status: Home / Self Care | Payer: 59 | Source: Ambulatory Visit | Attending: Family Medicine | Admitting: Family Medicine

## 2021-04-29 ENCOUNTER — Ambulatory Visit (INDEPENDENT_AMBULATORY_CARE_PROVIDER_SITE_OTHER): Payer: Medicaid Other | Admitting: Family Medicine

## 2021-04-29 ENCOUNTER — Encounter: Payer: Self-pay | Admitting: Family Medicine

## 2021-04-29 ENCOUNTER — Other Ambulatory Visit: Payer: Self-pay

## 2021-04-29 VITALS — BP 132/84 | HR 82 | Temp 98.4°F | Resp 16 | Ht 69.0 in | Wt 192.0 lb

## 2021-04-29 DIAGNOSIS — B3731 Acute candidiasis of vulva and vagina: Secondary | ICD-10-CM

## 2021-04-29 DIAGNOSIS — B373 Candidiasis of vulva and vagina: Secondary | ICD-10-CM | POA: Diagnosis not present

## 2021-04-29 DIAGNOSIS — Z0000 Encounter for general adult medical examination without abnormal findings: Secondary | ICD-10-CM

## 2021-04-29 DIAGNOSIS — Z124 Encounter for screening for malignant neoplasm of cervix: Secondary | ICD-10-CM | POA: Insufficient documentation

## 2021-04-29 DIAGNOSIS — Z23 Encounter for immunization: Secondary | ICD-10-CM

## 2021-04-29 MED ORDER — FLUCONAZOLE 150 MG PO TABS: 150.0000 mg | ORAL_TABLET | ORAL | 0 refills | Status: DC

## 2021-04-29 NOTE — Addendum Note (Signed)
Addended by: [name] F on: 04/29/2021 01:03 PM   Modules accepted: Orders

## 2021-05-01 ENCOUNTER — Other Ambulatory Visit: Payer: Self-pay | Admitting: *Deleted

## 2021-05-01 ENCOUNTER — Telehealth: Payer: Self-pay | Admitting: Cardiology

## 2021-05-01 DIAGNOSIS — E113513 Type 2 diabetes mellitus with proliferative diabetic retinopathy with macular edema, bilateral: Secondary | ICD-10-CM | POA: Insufficient documentation

## 2021-05-01 MED ORDER — CARVEDILOL 6.25 MG PO TABS: 12.5000 mg | ORAL_TABLET | Freq: Two times a day (BID) | ORAL | 1 refills | Status: DC

## 2021-05-01 MED ORDER — CARVEDILOL 12.5 MG PO TABS: 12.5000 mg | ORAL_TABLET | Freq: Two times a day (BID) | ORAL | 1 refills | Status: DC

## 2021-05-01 NOTE — Telephone Encounter (Signed)
Requested Prescriptions   Signed Prescriptions Disp Refills   carvedilol (COREG) 12.5 MG tablet 60 tablet 1    Sig: Take 1 tablet (12.5 mg total) by mouth 2 (two) times daily.    Authorizing Provider: [name]    Ordering User: Thayer Headings, Jomar Denz L   Dosage was increased in the hospital after last office visit. Note sent to pharmacy to D/C carvedilol 6.25mg  BID. New Rx sent as above.

## 2021-05-01 NOTE — Telephone Encounter (Signed)
*  STAT* If patient is at the pharmacy, call can be transferred to refill team.   1. Which medications need to be refilled? (please list name of each medication and dose if known)  Coreg 12.5 mg two tablets bid  2. Which pharmacy/location (including street and city if local pharmacy) is medication to be sent to? [facility] in [place]  3. Do they need a 30 day or 90 day supply? 30 with refills  Patient has recent increase in medication and needs a refill, also wants this Rx sent to a different pharmacy than before

## 2021-05-01 NOTE — Addendum Note (Signed)
Addended by: Thayer Headings, Marcelline Temkin L on: 05/01/2021 11:23 AM   Modules accepted: Orders

## 2021-05-01 NOTE — Telephone Encounter (Signed)
Requested Prescriptions   Signed Prescriptions Disp Refills   carvedilol (COREG) 6.25 MG tablet 60 tablet 1    Sig: Take 2 tablets (12.5 mg total) by mouth 2 (two) times daily.    Authorizing Provider: [name]    Ordering User: Thayer Headings, Nikko Goldwire L

## 2021-05-01 NOTE — Progress Notes (Signed)
Surgical Instructions    Your procedure is scheduled on 05/06/21.  Report to [facility] Main Entrance "A" at 5:30 A.M., then check in with the Admitting office.  Call this number if you have problems the morning of surgery:  [phone]   If you have any questions prior to your surgery date call [phone]: Open Monday-Friday 8am-4pm    Remember:  Do not eat or drink after midnight the night before your surgery    Take these medicines the morning of surgery with A SIP OF WATER: atorvastatin (LIPITOR) carvedilol (COREG)  omeprazole (PRILOSEC)  If needed: albuterol (VENTOLIN HFA) SUMAtriptan (IMITREX)   As of today, STOP taking any Aspirin (unless otherwise instructed by your surgeon) Aleve, Naproxen, Ibuprofen, Motrin, Advil, Goody's, BC's, all herbal medications, fish oil, and all vitamins.   WHAT DO I DO ABOUT MY DIABETES MEDICATION?   Marland Kitchen Do not take oral diabetes medicines (pills) the morning of surgery     . THE MORNING OF SURGERY, take ____9___ units of ____Lantus______insulin.  . The day of surgery, do not take other diabetes injectables, including Byetta (exenatide), Bydureon (exenatide ER), Victoza (liraglutide), or Trulicity (dulaglutide).  . If your CBG is greater than 220 mg/dL, you may take  of your sliding scale (correction) dose of insulin.   HOW TO MANAGE YOUR DIABETES BEFORE AND AFTER SURGERY  Why is it important to control my blood sugar before and after surgery? . Improving blood sugar levels before and after surgery helps healing and can limit problems. . A way of improving blood sugar control is eating a healthy diet by: o  Eating less sugar and carbohydrates o  Increasing activity/exercise o  Talking with your doctor about reaching your blood sugar goals . High blood sugars (greater than 180 mg/dL) can raise your risk of infections and slow your recovery, so you will need to focus on controlling your diabetes during the weeks before  surgery. . Make sure that the doctor who takes care of your diabetes knows about your planned surgery including the date and location.  How do I manage my blood sugar before surgery? . Check your blood sugar at least 4 times a day, starting 2 days before surgery, to make sure that the level is not too high or low. . Check your blood sugar the morning of your surgery when you wake up and every 2 hours until you get to the Short Stay unit. o If your blood sugar is less than 70 mg/dL, you will need to treat for low blood sugar: - Do not take insulin. - Treat a low blood sugar (less than 70 mg/dL) with  cup of clear juice (cranberry or apple), 4 glucose tablets, OR glucose gel. - Recheck blood sugar in 15 minutes after treatment (to make sure it is greater than 70 mg/dL). If your blood sugar is not greater than 70 mg/dL on recheck, call [phone] for further instructions. . Report your blood sugar to the short stay nurse when you get to Short Stay.  . If you are admitted to the hospital after surgery: o Your blood sugar will be checked by the staff and you will probably be given insulin after surgery (instead of oral diabetes medicines) to make sure you have good blood sugar levels. o The goal for blood sugar control after surgery is 80-180 mg/dL.                      Do not wear jewelry, make up,  or nail polish            Do not wear lotions, powders, perfumes or deodorant.            Do not shave 48 hours prior to surgery.              Do not bring valuables to the hospital.            [facility] is not responsible for any belongings or valuables.  Do NOT Smoke (Tobacco/Vaping) or drink Alcohol 24 hours prior to your procedure If you use a CPAP at night, you may bring all equipment for your overnight stay.   Contacts, glasses, dentures or bridgework may not be worn into surgery, please bring cases for these belongings   For patients admitted to the hospital, discharge time will be  determined by your treatment team.   Patients discharged the day of surgery will not be allowed to drive home, and someone needs to stay with them for 24 hours.    Special instructions:    Oral Hygiene is also important to reduce your risk of infection.  Remember - BRUSH YOUR TEETH THE MORNING OF SURGERY WITH YOUR REGULAR TOOTHPASTE   [facility]- Preparing For Surgery  Before surgery, you can play an important role. Because skin is not sterile, your skin needs to be as free of germs as possible. You can reduce the number of germs on your skin by washing with CHG (chlorahexidine gluconate) Soap before surgery.  CHG is an antiseptic cleaner which kills germs and bonds with the skin to continue killing germs even after washing.     Please do not use if you have an allergy to CHG or antibacterial soaps. If your skin becomes reddened/irritated stop using the CHG.  Do not shave (including legs and underarms) for at least 48 hours prior to first CHG shower. It is OK to shave your face.  Please follow these instructions carefully.    1.  Shower the NIGHT BEFORE SURGERY and the MORNING OF SURGERY with CHG Soap.   If you chose to wash your hair, wash your hair first as usual with your normal shampoo. After you shampoo, rinse your hair and body thoroughly to remove the shampoo.  Then [employer] and genitals (private parts) with your normal soap and rinse thoroughly to remove soap.  2. After that Use CHG Soap as you would any other liquid soap. You can apply CHG directly to the skin and wash gently with a scrungie or a clean washcloth.   3. Apply the CHG Soap to your body ONLY FROM THE NECK DOWN.  Do not use on open wounds or open sores. Avoid contact with your eyes, ears, mouth and genitals (private parts). Wash Face and genitals (private parts)  with your normal soap.   4. Wash thoroughly, paying special attention to the area where your surgery will be performed.  5. Thoroughly rinse your body  with warm water from the neck down.  6. DO NOT shower/wash with your normal soap after using and rinsing off the CHG Soap.  7. Pat yourself dry with a CLEAN TOWEL.  8. Wear CLEAN PAJAMAS to bed the night before surgery  9. Place CLEAN SHEETS on your bed the night before your surgery  10. DO NOT SLEEP WITH PETS.   Day of Surgery: Take a shower with CHG soap. Wear Clean/Comfortable clothing the morning of surgery Do not apply any deodorants/lotions.   Remember to brush  your teeth WITH YOUR REGULAR TOOTHPASTE.   Please read over the following fact sheets that you were given.

## 2021-05-02 ENCOUNTER — Inpatient Hospital Stay (HOSPITAL_COMMUNITY)
Admission: RE | Admit: 2021-05-02 | Discharge: 2021-05-02 | Disposition: A | Discharge status: Home / Self Care | Payer: 59 | Source: Ambulatory Visit

## 2021-05-02 ENCOUNTER — Telehealth: Payer: Self-pay | Admitting: Family Medicine

## 2021-05-02 LAB — CYTOLOGY - PAP
Comment: NEGATIVE
Diagnosis: NEGATIVE
High risk HPV: NEGATIVE

## 2021-05-02 NOTE — Telephone Encounter (Signed)
   Telephone encounter was:  Unsuccessful.  05/02/2021 Name: Sarah Duffy MRN: [id number] DOB: 21-Nov-1971  Unsuccessful outbound call made today to assist with:  Financial Difficulties related to having upcoming heart surgery and losing her home.   Outreach Attempt:  1st Attempt  A HIPAA compliant voice message was left requesting a return call.  Instructed patient to call back at [phone].  Rojelio Brenner Care Guide, Embedded Care Coordination [facility], Care Management Phone: [phone] Email: [email or web address]

## 2021-05-05 ENCOUNTER — Other Ambulatory Visit: Payer: Self-pay | Admitting: Family Medicine

## 2021-05-05 ENCOUNTER — Other Ambulatory Visit: Payer: Self-pay

## 2021-05-05 DIAGNOSIS — F330 Major depressive disorder, recurrent, mild: Secondary | ICD-10-CM

## 2021-05-05 MED ORDER — ENTRESTO 24-26 MG PO TABS: 1.0000 | ORAL_TABLET | Freq: Two times a day (BID) | ORAL | 1 refills | Status: DC

## 2021-05-06 ENCOUNTER — Telehealth: Payer: Self-pay | Admitting: Family Medicine

## 2021-05-06 ENCOUNTER — Encounter (HOSPITAL_COMMUNITY): Admission: RE | Payer: Self-pay | Source: Ambulatory Visit

## 2021-05-06 ENCOUNTER — Inpatient Hospital Stay (HOSPITAL_COMMUNITY): Admission: RE | Admit: 2021-05-06 | Payer: 59 | Source: Ambulatory Visit | Admitting: Surgery

## 2021-05-06 DIAGNOSIS — I2510 Atherosclerotic heart disease of native coronary artery without angina pectoris: Secondary | ICD-10-CM

## 2021-05-06 SURGERY — CORONARY ARTERY BYPASS GRAFTING (CABG)
Anesthesia: General | Site: Chest

## 2021-05-06 NOTE — Telephone Encounter (Signed)
   Telephone encounter was:  Successful.  05/06/2021 Name: Kima Malenfant MRN: [id number] DOB: 08-Jun-1971  Tishia Maestre is a 50 y.o. year old female who is a primary care patient of [name], MD . The community resource team was consulted for assistance with Financial Difficulties related to open heart surgery.  Care guide performed the following interventions: Patient provided with information about care guide support team and interviewed to confirm resource needs Discussed resources to assist with her financial status after she has Bypass Heart Surgery. As of right now her surgery has not been scheduled. Pt wants to make sure she has her financial needs taken care of before she goes into surgery and out of work for 3 months. I gave her a few resources like [employer] [place] of [phone], [facility] application was sent to her with their telephone number of [phone] so that she can call them and set up a payment plan or apply for the help before surgery because the out of pocket for this surgery is large. Also asked her to call DSS of [place] [phone] to see if they have emergency funds for this kind of thing. I also suggest her calling the unemployment office to see what if any programs they have for her situation of being out of work for 3 months. I will defer this referral until pt tells me the date of her surgery. At that time I will set up [facility] nutrition as well as [employer] for after surgery and also apply for a grant through [facility] to help pay her utility bills up to $500.  Follow Up Plan:  Care guide will follow up with patient by phone over the next week and Client will call me and let me know when surgery is scheduled to occur.   Rojelio Brenner Care Guide, Embedded Care Coordination [facility], Care Management Phone: [phone] Email: [email or web address]

## 2021-05-08 ENCOUNTER — Other Ambulatory Visit: Payer: Self-pay | Admitting: Family Medicine

## 2021-05-08 DIAGNOSIS — R809 Proteinuria, unspecified: Secondary | ICD-10-CM

## 2021-05-08 DIAGNOSIS — E1129 Type 2 diabetes mellitus with other diabetic kidney complication: Secondary | ICD-10-CM

## 2021-05-08 NOTE — Telephone Encounter (Signed)
Requested Prescriptions  Pending Prescriptions Disp Refills  . JARDIANCE 10 MG TABS tablet [Pharmacy Med Name: JARDIANCE 10 MG TABLET] 90 tablet 1    Sig: TAKE 1 TABLET BY MOUTH EVERY DAY     Endocrinology:  Diabetes - SGLT2 Inhibitors Failed - 05/08/2021  1:38 AM      Failed - LDL in normal range and within 360 days    LDL Cholesterol (Calc)  Date Value Ref Range Status  02/11/2021 229 (H) mg/dL (calc) Final    Comment:    LDL-C levels > or = 190 mg/dL may indicate familial  hypercholesterolemia (FH). Clinical assessment and  measurement of blood lipid levels should be  considered for all first degree relatives of  patients with an FH diagnosis.  For questions about testing for familial hypercholesterolemia, please call Insurance risk surveyor at [email or web address]. Duncan Dull, et al. J National Lipid Association  Recommendations for Patient-Centered Management of  Dyslipidemia: Part 1 Journal of Clinical Lipidology  2015;9(2), 129-169. Reference range: <100 . Desirable range <100 mg/dL for primary prevention;   <70 mg/dL for patients with CHD or diabetic patients  with > or = 2 CHD risk factors. Marland Kitchen LDL-C is now calculated using the Martin-Hopkins  calculation, which is a validated novel method providing  better accuracy than the Friedewald equation in the  estimation of LDL-C.  Cresenciano Genre et al. Annamaria Helling. 7672;094(70): 2061-2068  (http://education.[employer].com/faq/FAQ164)          Failed - HBA1C is between 0 and 7.9 and within 180 days    Hgb A1c MFr Bld  Date Value Ref Range Status  04/22/2021 11.1 (H) 4.8 - 5.6 % Final    Comment:    (NOTE) Pre diabetes:          5.7%-6.4%  Diabetes:              >6.4%  Glycemic control for   <7.0% adults with diabetes          Passed - Cr in normal range and within 360 days    Creat  Date Value Ref Range Status  02/11/2021 0.98 0.50 - 1.05 mg/dL Final    Comment:    For patients >41 years of age, the reference  limit for Creatinine is approximately 13% higher for people identified as African-American. .    Creatinine, Ser  Date Value Ref Range Status  04/22/2021 0.93 0.44 - 1.00 mg/dL Final   Creatinine, Urine  Date Value Ref Range Status  02/11/2021 34 20 - 275 mg/dL Final         Passed - eGFR in normal range and within 360 days    GFR, Est African American  Date Value Ref Range Status  02/11/2021 77 > OR = 60 mL/min/1.47m Final   GFR, Est Non African American  Date Value Ref Range Status  02/11/2021 67 > OR = 60 mL/min/1.7110mFinal   GFR, Estimated  Date Value Ref Range Status  04/22/2021 >60 >60 mL/min Final    Comment:    (NOTE) Calculated using the CKD-EPI Creatinine Equation (2021)    eGFR  Date Value Ref Range Status  04/11/2021 48 (L) >59 mL/min/1.73 Final         Passed - Valid encounter within last 6 months    Recent Outpatient Visits          [facility] week [facility]   2 months ago 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  [facility], Drue Stager, MD   2 months ago 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[facility])   [facility] Steele Sizer, MD   1 year ago Depression, major, recurrent, mild [facility])   [facility] Steele Sizer, MD   1 year ago 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[facility])   [facility] Steele Sizer, MD      Future Appointments            In 1 week [place], [place], PA-C [place], [place]   In 1 month Jonathon Bellows, MD [place]   In 2 months Agbor-Etang, Aaron Edelman, MD [facility]   In 2 months Steele Sizer, MD [facility], [place]   In 3 months Agbor-Etang, Aaron Edelman, MD [facility], [place]

## 2021-05-13 ENCOUNTER — Telehealth: Payer: Self-pay | Admitting: Family Medicine

## 2021-05-13 NOTE — Telephone Encounter (Signed)
   Telephone encounter was:  Successful.  05/13/2021 Name: [name] MRN: [id number] DOB: 1971/04/07  Sarah Duffy is a 50 y.o. year old female who is a primary care patient of [name], MD . The community resource team was consulted for assistance with information  Care guide performed the following interventions: She went to [facility] spoke to April from the womans resource center to the to see what programs they have to help her with finding funds for when she will be down from her heart surgery. Still doesn't know when it is to be scheduled. She also left a vm for social services for adult programs as well. [employer] and [employer] for phone services for those receiving food stamps. .  Follow Up Plan:  Care guide will follow up with patient by phone over the next week  Rojelio Brenner Care Guide, Embedded Care Coordination [facility], Care Management Phone: [phone] Email: [email or web address]

## 2021-05-14 ENCOUNTER — Telehealth: Payer: Self-pay

## 2021-05-14 NOTE — Progress Notes (Signed)
Cardiology Office Note:    Date:  05/15/2021   ID:  Sarah BrownsAngela Montemayor, DOB May 15, 1971, MRN [id number]  PCP:  Alba CorySowles, Krichna, MD  [facility] Cardiologist:  Debbe OdeaBrian Agbor-Etang, MD  [facility] Electrophysiologist:  None   Referring MD: Alba CorySowles, Krichna, MD   Chief Complaint: F/u cath  History of Present Illness:    Sarah Duffy is a 50 y.o. female with a hx of HTN, HLD who presents for follow-up post-cardiac catheterization. She was seen previously for chest pain and an echo was ordered. Echo showed LVEF 35-40%, global hypokinesis, G1DD, mildly dilated LA, mod MR. She was then set up for cardiac catheterization. This showed multivessel CAD, upper normal left heart pressures, normal right heart and pulmopnary artery pressures, ICM with reduced EF. Recommendations for consult with cardiac surgery for CABG. Aspirin 81 was started and coreg was increased to 12.285mbBID.   She saw Dr. Laneta SimmersBartle 04/16/21 and he recommended CABG. She initially agreed and the surgery was scheudled. However, she had another conversation with a doctor (?podiatrist) who recommended she seek a second opinion. [facility] who also recommended pursing CABG.   Today, the patient reports she has been doing well since the last visit. No chest pain or SOB. Right wrist cath site healing well and without complications. Cardiac cath reviewed in detail. CABG discussed as well, some risks and benefits and what to expect. Patient is willing to proceed with surgery however has financial issues and is trying to find solutions, so she can be comfortable post-procedure and not worried about finances. She works full-time and going without work for 3 months would be very difficult for her. She plans on calling CTTS back and scheduling surgery along with pre-op appointment where questions can be better answered. She carries Nitro on her, has not needed. BP and HR good today. No LLE, orthopnea, pnd, or palpitations.  Past Medical  History:  Diagnosis Date  . Coronary artery disease   . COVID-19 2021  . Diabetes mellitus without complication (HCC)   . Dyspnea    occasionally   . Hypertension   . MRSA infection within last 3 months 02/25/2016  . Osteomyelitis of foot (HCC) 08/26/2016    Past Surgical History:  Procedure Laterality Date  . CHOLECYSTECTOMY  1999  . COLONOSCOPY WITH PROPOFOL N/A 03/25/2021   Procedure: COLONOSCOPY WITH PROPOFOL;  Surgeon: Wyline MoodAnna, Kiran, MD;  [facility];  Service: Gastroenterology;  Laterality: N/A;  . COLONOSCOPY WITH PROPOFOL N/A 04/17/2021   Procedure: COLONOSCOPY WITH PROPOFOL;  Surgeon: Wyline MoodAnna, Kiran, MD;  [facility];  Service: Gastroenterology;  Laterality: N/A;  . ESOPHAGOGASTRODUODENOSCOPY (EGD) WITH PROPOFOL N/A 03/25/2021   Procedure: ESOPHAGOGASTRODUODENOSCOPY (EGD) WITH PROPOFOL;  Surgeon: Wyline MoodAnna, Kiran, MD;  [facility];  Service: Gastroenterology;  Laterality: N/A;  . ESOPHAGOGASTRODUODENOSCOPY (EGD) WITH PROPOFOL N/A 04/17/2021   Procedure: ESOPHAGOGASTRODUODENOSCOPY (EGD) WITH PROPOFOL;  Surgeon: Wyline MoodAnna, Kiran, MD;  [facility];  Service: Gastroenterology;  Laterality: N/A;  . RIGHT/LEFT HEART CATH AND CORONARY ANGIOGRAPHY Bilateral 04/14/2021   Procedure: RIGHT/LEFT HEART CATH AND CORONARY ANGIOGRAPHY;  Surgeon: Yvonne KendallEnd, Christopher, MD;  Location: [facility] INVASIVE CV LAB;  Service: Cardiovascular;  Laterality: Bilateral;  . TOE SURGERY Left 02/07/2016   Pinky Toe    Current Medications: Current Meds  Medication Sig  . albuterol (VENTOLIN HFA) 108 (90 Base) MCG/ACT inhaler Inhale 2 puffs into the lungs every 6 (six) hours as needed for wheezing or shortness of breath.  Marland Kitchen. aspirin EC 81 MG tablet Take  1 tablet (81 mg total) by mouth daily. Swallow whole.  Marland Kitchen atorvastatin (LIPITOR) 40 MG tablet Take 1 tablet (40 mg total) by mouth daily.  . carvedilol (COREG) 12.5 MG tablet Take 1 tablet (12.5 mg total) by mouth 2 (two) times daily.  .  fluconazole (DIFLUCAN) 150 MG tablet Take 1 tablet (150 mg total) by mouth every other day.  . insulin glargine (LANTUS) 100 UNIT/ML injection Inject 18 Units into the skin daily. In the morning  . JARDIANCE 10 MG TABS tablet TAKE 1 TABLET BY MOUTH EVERY DAY  . Multiple Vitamins-Minerals (MULTIVITAMIN WITH MINERALS) tablet Take 1 tablet by mouth daily.  . nitroGLYCERIN (NITROSTAT) 0.4 MG SL tablet Place 1 tablet (0.4 mg total) under the tongue every 5 (five) minutes as needed for chest pain.  Marland Kitchen omeprazole (PRILOSEC) 40 MG capsule Take 1 capsule (40 mg total) by mouth daily.  . sacubitril-valsartan (ENTRESTO) 24-26 MG Take 1 tablet by mouth 2 (two) times daily.  Marland Kitchen spironolactone (ALDACTONE) 25 MG tablet Take 0.5 tablets (12.5 mg total) by mouth daily.  . SUMAtriptan (IMITREX) 100 MG tablet SMARTSIG:1 Tablet(s) By Mouth Every 2 Hours PRN     Allergies:   Patient has no known allergies.   Social History   Socioeconomic History  . Marital status: Single    Spouse name: Not on file  . Number of children: 1  . Years of education: Not on file  . Highest education level: Some college, no degree  Occupational History  . Occupation: Dealer   Tobacco Use  . Smoking status: Never Smoker  . Smokeless tobacco: Never Used  Vaping Use  . Vaping Use: Never used  Substance and Sexual Activity  . Alcohol use: No    Alcohol/week: 0.0 standard drinks  . Drug use: No  . Sexual activity: Yes    Partners: Male    Birth control/protection: Injection  Other Topics Concern  . Not on file  Social History Narrative   She used to work at [employer], but  Feb 2017 she left work because of  MRSA infection.osteomyelitis and uncontrolled DM. She has been back to work since Feb 2018   Lives alone   Social Determinants of Health   Financial Resource Strain: Medium Risk  . Difficulty of Paying Living Expenses: Somewhat hard  Food Insecurity: Food Insecurity Present  . Worried About Patent examiner in the Last Year: Often true  . Ran Out of Food in the Last Year: Often true  Transportation Needs: Unmet Transportation Needs  . Lack of Transportation (Medical): Yes  . Lack of Transportation (Non-Medical): Yes  Physical Activity: Insufficiently Active  . Days of Exercise per Week: 4 days  . Minutes of Exercise per Session: 30 min  Stress: Stress Concern Present  . Feeling of Stress : Rather much  Social Connections: Moderately Isolated  . Frequency of Communication with Friends and Family: More than three times a week  . Frequency of Social Gatherings with Friends and Family: More than three times a week  . Attends Religious Services: More than 4 times per year  . Active Member of Clubs or Organizations: No  . Attends Banker Meetings: Never  . Marital Status: Never married     Family History: The patient's family history includes AAA (abdominal aortic aneurysm) in her father; Alzheimer's disease in her father; Breast cancer in her maternal grandmother; Diabetes in her father, maternal grandmother, and mother; Heart attack in her sister; Heart  disease in her father; Hypertension in her father; Seizures in her brother; Stroke in her father; Ulcers in her mother.  ROS:   Please see the history of present illness.     All other systems reviewed and are negative.  EKGs/Labs/Other Studies Reviewed:    The following studies were reviewed today:  Cardiac cath 04/14/21 Conclusions: 1. Multivessel coronary artery disease including mild to moderate diffuse plaquing of the LAD and RCA.  The mid/distal LAD demonstrates 3 focal lesions of up to 80% that are hemodynamically significant by iFR (iFR = 0.71).  There is also a 99% stenosis of OM2 with faint left to left collaterals supplying the distal branch as well as sequential 60% distal RCA and 70-80% proximal RPDA lesions. 2. Upper normal to mildly elevated left heart filling pressure. 3. Normal right heart and pulmonary  artery pressures. 4. Normal cardiac output/index.  Recommendations: 1. In the setting of multivessel coronary artery disease, diabetes mellitus, and reduced LVEF that appears to be due to ischemic cardiomyopathy, consultation with cardiac surgery for consideration of CABG is recommended. 2. Initiate aspirin 81 mg daily. 3. Increase carvedilol to 12.5 mg twice daily with further escalation of goal-directed medical therapy for chronic HFrEF as tolerated. 4. Aggressive secondary prevention of coronary artery disease.  Yvonne Kendall, MD [facility]  Echo 03/12/32 1. Left ventricular ejection fraction, by estimation, is 35 to 40%. The  left ventricle has moderately decreased function. The left ventricle  demonstrates global hypokinesis. Left ventricular diastolic parameters are  consistent with Grade I diastolic  dysfunction (impaired relaxation). The average left ventricular global  longitudinal strain is -13.5 %. The global longitudinal strain is  abnormal.  2. Right ventricular systolic function is normal. The right ventricular  size is normal.  3. Left atrial size was mildly dilated.  4. The mitral valve is normal in structure. Moderate mitral valve  regurgitation.   EKG:  EKG is not ordered today.    Recent Labs: 04/22/2021: ALT 17; BUN 13; Creatinine, Ser 0.93; Hemoglobin 10.5; Platelets 266; Potassium 3.7; Sodium 135  Recent Lipid Panel    Component Value Date/Time   CHOL 297 (H) 02/11/2021 1433   CHOL 208 (H) 02/28/2016 0951   TRIG 86 02/11/2021 1433   HDL 48 (L) 02/11/2021 1433   HDL 42 02/28/2016 0951   CHOLHDL 6.2 (H) 02/11/2021 1433   LDLCALC 229 (H) 02/11/2021 1433    Physical Exam:    VS:  BP 130/68 (BP Location: Left Arm, Patient Position: Sitting, Cuff Size: Normal)   Pulse 76   Wt 192 lb (87.1 kg)   SpO2 99%   BMI 28.35 kg/m     Wt Readings from Last 3 Encounters:  05/15/21 192 lb (87.1 kg)  04/29/21 192 lb (87.1 kg)  04/22/21 195 lb (88.5  kg)     GEN:  Well nourished, well developed in no acute distress HEENT: Normal NECK: No JVD; No carotid bruits LYMPHATICS: No lymphadenopathy CARDIAC: RRR, + murmur, no rubs, gallops RESPIRATORY:  Clear to auscultation without rales, wheezing or rhonchi  ABDOMEN: Soft, non-tender, non-distended MUSCULOSKELETAL:  No edema; No deformity  SKIN: Warm and dry NEUROLOGIC:  Alert and oriented x 3 PSYCHIATRIC:  Normal affect   ASSESSMENT:    1. Coronary artery disease involving native heart with angina pectoris, unspecified vessel or lesion type (HCC)   2. HFrEF (heart failure with reduced ejection fraction) (HCC)   3. Chest pain of uncertain etiology   4. Primary hypertension   5.  Hyperlipidemia, mixed   6. Chronic systolic heart failure (HCC)   7. Ischemic cardiomyopathy    PLAN:    In order of problems listed above:  Multivessel CAD Cath showed 3V CAD and recommendations were for consult for CABG. She saw Dr. Laneta Simmers who recommended surgery, however patient did not have complete understanding of surgery and sought second opinion, who also recommended to pursue the surgery. Today we discussed the results of the cardiac catheterization and went over general information regarding open heart surgery.Patient is agreeable to pursue CABG, however has some financial issues and questions. I contacted Social Worker, Lenore Manner, who will contact the patient tomorrow regarding helpful resources. Patient has no chest pain or shortness of breath. She has SL Nitro on her at all times, but has not needed it. Continue Aspirin, statin, BB. Patient plans on scheduling surgery within the next 1-2 months, we will see her back after this.   ICM HFrEF EF 35-40% be echo 03/12/21. She [place] euvolemic on exam. Plan for CABG to address CAD. Continue Coreg, [place], Entresto. I will add spironolactone 12.5mg  daily and check a BMET in 1 week. She will eventually need repeat echo after surgery.  HTN BP well  controlled. Continue current regimen. Addition of Spiro as above.   HLD LDL 229 02/2021. Continue Atorvastatin 40mg  daily. Can increase statin at follow-up.    Disposition: Follow up post CABG (TBD) with MD/APP    Signed, Miranda Garber , PA-C  05/15/2021 7:47 PM    [facility]

## 2021-05-14 NOTE — Telephone Encounter (Signed)
lvm informing pt that per Dr Carlynn Purl we do not prescribe antibiotics without being seen. She can try otc medications and nasal saline or schedule an appointment for our next availability. She also have the option of urgent care or mychart video Copied from CRM [id number]. Topic: General - Other >> May 14, 2021 11:40 AM Jaquita Rector A wrote: Reason for CRM: Patient called in to inquire of Dr Carlynn Purl about calling her in an Rx for her allergies and sinus possibly a Zpack or something else since the OTC allergy medication is not working. Please advise Ph# [phone]

## 2021-05-15 ENCOUNTER — Ambulatory Visit (INDEPENDENT_AMBULATORY_CARE_PROVIDER_SITE_OTHER): Payer: 59 | Admitting: Medical

## 2021-05-15 ENCOUNTER — Encounter: Payer: Self-pay | Admitting: Medical

## 2021-05-15 ENCOUNTER — Other Ambulatory Visit: Payer: Self-pay

## 2021-05-15 ENCOUNTER — Telehealth: Payer: Self-pay | Admitting: Family Medicine

## 2021-05-15 VITALS — BP 130/68 | HR 76 | Wt 192.0 lb

## 2021-05-15 DIAGNOSIS — R079 Chest pain, unspecified: Secondary | ICD-10-CM

## 2021-05-15 DIAGNOSIS — I5020 Unspecified systolic (congestive) heart failure: Secondary | ICD-10-CM

## 2021-05-15 DIAGNOSIS — I255 Ischemic cardiomyopathy: Secondary | ICD-10-CM

## 2021-05-15 DIAGNOSIS — I10 Essential (primary) hypertension: Secondary | ICD-10-CM

## 2021-05-15 DIAGNOSIS — E782 Mixed hyperlipidemia: Secondary | ICD-10-CM

## 2021-05-15 DIAGNOSIS — I5022 Chronic systolic (congestive) heart failure: Secondary | ICD-10-CM

## 2021-05-15 DIAGNOSIS — I25119 Atherosclerotic heart disease of native coronary artery with unspecified angina pectoris: Secondary | ICD-10-CM

## 2021-05-15 MED ORDER — SPIRONOLACTONE 25 MG PO TABS: 12.5000 mg | ORAL_TABLET | Freq: Every day | ORAL | 5 refills | Status: DC

## 2021-05-15 NOTE — Telephone Encounter (Signed)
   Telephone encounter was:  Unsuccessful.  05/15/2021 Name: Sarah Duffy MRN: [id number] DOB: [birth year]  Unsuccessful outbound call made today to assist with:  Food Insecurity  Outreach Attempt:  1st Attempt  A HIPAA compliant voice message was left requesting a return call.  Instructed patient to call back at [phone].  Rojelio Brenner Care Guide, Embedded Care Coordination [facility], Care Management Phone: [phone] Email: [email or web address]

## 2021-05-15 NOTE — Patient Instructions (Signed)
Medication Instructions:   Start taking Spironolactone 12.5 MG once a day.  *If you need a refill on your cardiac medications before your next appointment, please call your pharmacy*   Lab Work: None ordered If you have labs (blood work) drawn today and your tests are completely normal, you will receive your results only by: Marland Kitchen MyChart Message (if you have MyChart) OR . A paper copy in the mail If you have any lab test that is abnormal or we need to change your treatment, we will call you to review the results.   Testing/Procedures: None ordered   Follow-Up: At [facility], you and your health needs are our priority.  As part of our continuing mission to provide you with exceptional heart care, we have created designated Provider Care Teams.  These Care Teams include your primary Cardiologist (physician) and Advanced Practice Providers (APPs -  Physician Assistants and Nurse Practitioners) who all work together to provide you with the care you need, when you need it.  We recommend signing up for the patient portal called "MyChart".  Sign up information is provided on this After Visit Summary.  MyChart is used to connect with patients for Virtual Visits (Telemedicine).  Patients are able to view lab/test results, encounter notes, upcoming appointments, etc.  Non-urgent messages can be sent to your provider as well.   To learn more about what you can do with MyChart, go to [email or web address].    Your next appointment:   Follow up after surgery and your surgeon clears you.   The format for your next appointment:   In Person  Provider:   You may see [name], MD or one of the following Advanced Practice Providers on your designated Care Team:    Nicolasa Ducking, NP  Eula Listen, PA-C  Marisue Ivan, PA-C  Cadence Colfax, [place]  Gillian Shields, NP    Other Instructions

## 2021-05-16 ENCOUNTER — Telehealth (HOSPITAL_COMMUNITY): Payer: Self-pay | Admitting: Licensed Clinical Social Worker

## 2021-05-16 NOTE — Telephone Encounter (Signed)
CSW consulted to speak with pt regarding concerns with how she will manage financially if she has to be out for 3 months following a CABG procedure.  CSW reached out to pt to discuss potential assistance options- phone went straight to VM- left VM requesting return call.  Will continue to follow and assist as needed  Burna Sis, LCSW Clinical Social Worker [facility] Desk#: [phone] Cell#: [phone]

## 2021-05-20 ENCOUNTER — Telehealth (HOSPITAL_COMMUNITY): Payer: Self-pay | Admitting: Licensed Clinical Social Worker

## 2021-05-20 ENCOUNTER — Telehealth: Payer: Self-pay | Admitting: Family Medicine

## 2021-05-20 NOTE — Telephone Encounter (Signed)
   Telephone encounter was:  Unsuccessful.  05/20/2021 Name: Sarah Duffy MRN: [id number] DOB: 1971/11/09  Unsuccessful outbound call made today to assist with:  Food Insecurity  Outreach Attempt:  2nd Attempt  A HIPAA compliant voice message was left requesting a return call.  Instructed patient to call back at [phone]. Called patient to see if she has scheduled her Bypass surgery yet and it went straight to vm. This is my second attempt, pt will call me when she is back from the hospital and we will set up meal delivery for the time when she is recovering at home. Closing referral for now.   Rojelio Brenner Care Guide, Embedded Care Coordination [facility], Care Management Phone: [phone] Email: [email or web address]

## 2021-05-20 NOTE — Telephone Encounter (Signed)
CSW attempted to call pt to follow up regarding financial concerns but went straight to VM- left message requesting return call.  Also sent pt an email at address in chart.  Also attempted to call pt mom who is listed as the emergency contact to see if they had updated contact information but phone number is no longer in service.  Will continue to follow and attempt to make contact to discuss concerns  Burna Sis, LCSW Clinical Social Worker [facility] Desk#: [phone] Cell#: [phone]

## 2021-05-26 ENCOUNTER — Telehealth: Payer: Self-pay | Admitting: Licensed Clinical Social Worker

## 2021-05-26 NOTE — Telephone Encounter (Signed)
LCSW noted referral for assistance financially to help manage upcoming recommended procedure. Per notes pt has not responded to multiple calls from Heart and Vascular LCSW team and [facility] care guide. Noted per PCP notes preference for referral to [facility] provider. Our team does remain available as needed moving forward.   Octavio Graves, MSW, LCSW [facility] Heart/Vascular Care Navigation  [phone]

## 2021-06-11 ENCOUNTER — Other Ambulatory Visit: Payer: Self-pay

## 2021-06-12 ENCOUNTER — Other Ambulatory Visit: Payer: Self-pay

## 2021-06-12 ENCOUNTER — Ambulatory Visit (INDEPENDENT_AMBULATORY_CARE_PROVIDER_SITE_OTHER): Payer: 59 | Admitting: Gastroenterology

## 2021-06-12 ENCOUNTER — Encounter: Payer: Self-pay | Admitting: Gastroenterology

## 2021-06-12 VITALS — BP 124/73 | HR 77 | Temp 98.3°F | Ht 69.0 in | Wt 191.0 lb

## 2021-06-12 DIAGNOSIS — K5900 Constipation, unspecified: Secondary | ICD-10-CM | POA: Diagnosis not present

## 2021-06-12 DIAGNOSIS — K219 Gastro-esophageal reflux disease without esophagitis: Secondary | ICD-10-CM | POA: Diagnosis not present

## 2021-06-12 DIAGNOSIS — R1310 Dysphagia, unspecified: Secondary | ICD-10-CM

## 2021-06-12 NOTE — Patient Instructions (Signed)
Please take Linzess 290 mcg capsule 30 minutes before breakfast for two weeks.  We will see you back in a year.

## 2021-06-12 NOTE — Progress Notes (Signed)
[name] MD, MRCP(U.K) [address]  [address]  [place], [place] [address]  Main: [phone]  Fax: [phone]   Primary Care Physician: [name], MD  Primary Gastroenterologist:  Dr. [name]   C/c : Dysphagia follow up   HPI: Oluwademilade Kellett is a 50 y.o. female  Summary of history :  Initially referred and seen on 03/12/2021 for dysphagia and colon cancer screening. She was diagnosed with Covid last year and since then had been having some heartburn, gradual dysphagia for solids mostly and sometimes liquids which she feels getting stuck in the center of her throat.  She had taken Tums for heartburn with temporary relief.  She did complain of a early satiety and a sensation of food sitting in the stomach for a long period of time.  She had lost weight recently.  She did complain of longstanding constipation has not tried any agents.    Interval history   03/12/2021-06/12/2021  03/2021: EGD+colonoscopy normal. Biopsies of the esophagus were normal  Since her last visit she has had no issues with difficulty with swallowing.  She is taking her Prilosec 40 mg once a day.  She is working with her doctor to get her blood sugars down.  She takes MiraLAX daily but does not have satisfactory bowel movements.  She wishes she could have more often bowel movements.  Current Outpatient Medications  Medication Sig Dispense Refill   albuterol (VENTOLIN HFA) 108 (90 Base) MCG/ACT inhaler Inhale 2 puffs into the lungs every 6 (six) hours as needed for wheezing or shortness of breath. 8 g 0   aspirin EC 81 MG tablet Take 1 tablet (81 mg total) by mouth daily. Swallow whole.     atorvastatin (LIPITOR) 40 MG tablet Take 1 tablet (40 mg total) by mouth daily. 90 tablet 1   carvedilol (COREG) 12.5 MG tablet Take 1 tablet (12.5 mg total) by mouth 2 (two) times daily. 60 tablet 1   insulin glargine (LANTUS) 100 UNIT/ML injection Inject 18 Units into the skin daily. In the morning      JARDIANCE 10 MG TABS tablet TAKE 1 TABLET BY MOUTH EVERY DAY 90 tablet 1   Multiple Vitamins-Minerals (MULTIVITAMIN WITH MINERALS) tablet Take 1 tablet by mouth daily. 100 tablet 0   nitroGLYCERIN (NITROSTAT) 0.4 MG SL tablet Place 1 tablet (0.4 mg total) under the tongue every 5 (five) minutes as needed for chest pain. 25 tablet prn   omeprazole (PRILOSEC) 40 MG capsule Take 1 capsule (40 mg total) by mouth daily. 90 capsule 3   sacubitril-valsartan (ENTRESTO) 24-26 MG Take 1 tablet by mouth 2 (two) times daily. 60 tablet 1   spironolactone (ALDACTONE) 25 MG tablet Take 0.5 tablets (12.5 mg total) by mouth daily. 15 tablet 5   acetaminophen-codeine (TYLENOL #3) 300-30 MG tablet Take 1 tablet by mouth every 6 (six) hours. (Patient not taking: Reported on 06/12/2021)     No current facility-administered medications for this visit.    Allergies as of 06/12/2021   (No Known Allergies)    ROS:  General: Negative for anorexia, weight loss, fever, chills, fatigue, weakness. ENT: Negative for hoarseness, difficulty swallowing , nasal congestion. CV: Negative for chest pain, angina, palpitations, dyspnea on exertion, peripheral edema.  Respiratory: Negative for dyspnea at rest, dyspnea on exertion, cough, sputum, wheezing.  GI: See history of present illness. GU:  Negative for dysuria, hematuria, urinary incontinence, urinary frequency, nocturnal urination.  Endo: Negative for unusual weight change.  Physical Examination:   BP 124/73   Pulse 77   Temp 98.3 F (36.8 C) (Oral)   Ht 5\' 9"  (1.753 m)   Wt 191 lb (86.6 kg)   BMI 28.21 kg/m   General: Well-nourished, well-developed in no acute distress.  Eyes: No icterus. Conjunctivae pink. Neuro: Alert and oriented x 3.  Grossly intact. Skin: Warm and dry, no jaundice.   Psych: Alert and cooperative, normal mood and affect.   Imaging Studies: No results found.  Assessment and Plan:   Milianna Ericsson is a 50 y.o. y/o female here to  follow up  for dysphagia and constipation.  The dysphagia significantly improved and rather resolved after commencing on Prilosec and adhering to lifestyle changes including the use of a wedge pillow which she follows closely.  At 40 mg of Prilosec once a day she has absolutely no symptoms of dysphagia.  Once her blood sugars are under better control we can aim to reduce the dose of Prilosec as I do believe that her acid reflux is secondary to gastroparesis from her diabetes leading to increased acid production and reflux.  In terms of her constipation she has failed MiraLAX hence we will commence her on Linzess 2 9 0 mcg daily.  I will provide her 2 weeks of samples.  She has been advised to call 44 if it works well and we can provide a prescription of the same.   Dr [place]  MD,MRCP [facility]) Follow up in 1 year

## 2021-06-24 ENCOUNTER — Telehealth (INDEPENDENT_AMBULATORY_CARE_PROVIDER_SITE_OTHER): Payer: 59 | Admitting: Family Medicine

## 2021-06-24 ENCOUNTER — Other Ambulatory Visit: Payer: Self-pay

## 2021-06-24 ENCOUNTER — Encounter: Payer: Self-pay | Admitting: Family Medicine

## 2021-06-24 DIAGNOSIS — J069 Acute upper respiratory infection, unspecified: Secondary | ICD-10-CM | POA: Diagnosis not present

## 2021-06-24 MED ORDER — ALBUTEROL SULFATE HFA 108 (90 BASE) MCG/ACT IN AERS: 2.0000 | INHALATION_SPRAY | Freq: Four times a day (QID) | RESPIRATORY_TRACT | 0 refills | Status: DC | PRN

## 2021-06-24 NOTE — Progress Notes (Signed)
Virtual Visit via Video Note  I connected with Sarah Duffy on 06/24/21 at 10:00 AM EDT by a video enabled telemedicine application and verified that I am speaking with the correct person using two identifiers.  Location: Patient: home Provider: [facility] [address]   I discussed the limitations of evaluation and management by telemedicine and the availability of in person appointments. The patient expressed understanding and agreed to proceed.  History of Present Illness:  UPPER RESPIRATORY TRACT INFECTION - seen at [facility] 7/9 for sinusitis and bronchitis, given cefdinir x10 days,  - on last day of prednisone, has few days left of abx. - ear better, still throbbing - taking mucinex. - CBGs 190-low 200s.  Fever: no Cough: yes Shortness of breath: no Wheezing: no Chest pain: no Chest tightness: no Chest congestion: no Nasal congestion: yes Runny nose: yes Post nasal drip: yes Sore throat: yes Ear pain: yes left Ear pressure: yes left Vomiting: no Context: better Relief with OTC cold/cough medications: yes  Treatments attempted: mucinex and antibiotics, tessalon   Observations/Objective:  Well appearing, in NAD. No resp distress, speaks in complete sentences  Assessment and Plan:  AOM, Sinusitis Improving. Continue current plan with steroids, abx. Advised to watch CBGs while on steroids and call if remain elevated after finishing prednisone. Return to clinic if symptoms persist or worsen.     I discussed the assessment and treatment plan with the patient. The patient was provided an opportunity to ask questions and all were answered. The patient agreed with the plan and demonstrated an understanding of the instructions.   The patient was advised to call back or seek an in-person evaluation if the symptoms worsen or if the condition fails to improve as anticipated.  I provided 11 minutes of non-face-to-face time during this encounter.   Caro Laroche, DO

## 2021-06-24 NOTE — Patient Instructions (Signed)
It was great to see you!  Our plans for today:  - continue your antibiotics and steroids until completed - Keep an eye on your blood sugars and alert [place] if they remain elevated after finishing the prednisone.  - Go to the emergency room if you are experiencing difficulties breathing.  Take care and seek immediate care sooner if you develop any concerns.   Dr. Linwood Dibbles

## 2021-06-30 ENCOUNTER — Other Ambulatory Visit: Payer: Self-pay

## 2021-06-30 MED ORDER — CARVEDILOL 12.5 MG PO TABS: 12.5000 mg | ORAL_TABLET | Freq: Two times a day (BID) | ORAL | 1 refills | Status: DC

## 2021-07-01 ENCOUNTER — Telehealth: Payer: Self-pay | Admitting: Cardiology

## 2021-07-01 NOTE — Telephone Encounter (Signed)
Pt c/o swelling: STAT is pt has developed SOB within 24 hours  If swelling, where is the swelling located? Bilateral ankles, left shin  How much weight have you gained and in what time span? Not sure  Have you gained 3 pounds in a day or 5 pounds in a week? Not sure  Do you have a log of your daily weights (if so, list)? no  Are you currently taking a fluid pill? no  Are you currently SOB? no  Have you traveled recently? no

## 2021-07-02 ENCOUNTER — Other Ambulatory Visit: Payer: Self-pay

## 2021-07-02 MED ORDER — ENTRESTO 24-26 MG PO TABS
1.0000 | ORAL_TABLET | Freq: Two times a day (BID) | ORAL | 1 refills | Status: DC
Start: 2021-07-02 — End: 2021-09-01

## 2021-07-05 ENCOUNTER — Other Ambulatory Visit: Payer: Self-pay | Admitting: Family Medicine

## 2021-07-05 DIAGNOSIS — F330 Major depressive disorder, recurrent, mild: Secondary | ICD-10-CM

## 2021-07-05 NOTE — Telephone Encounter (Signed)
dc'd 04/11/21 Completed course [place] CMA

## 2021-07-07 NOTE — Telephone Encounter (Signed)
Spoke with patient. She stated that she is still having some swelling in both of her ankles and left calf / shin area. She denies any SOB. She states that there is some pitting when she presses on it, and that it takes "a little while" for the indentation to disappear. She is a little worried about her upcoming CABG, as it was originally scheduled for 05/06/21 but it had to be postponed because she developed an ear infection and was put on antibiotics. She is calling today to have it rescheduled.   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 I  also recommended that she get compression socks and elevate her feet and legs whenever able.   She has an appointment with Dr. Azucena Cecil on 07/14/21, will route to him for any further possible recommendations.

## 2021-07-07 NOTE — Telephone Encounter (Deleted)
Spoke with patient. She stated that she is still having some swelling in both of her ankles and left calf / shin area. She denies any SOB. She is a little worried about her upcoming CABG, as it was originally scheduled for 05/06/21 but it had to be postponed because she developed an ear infection and was put on antibiotics. She is calling today to have it rescheduled.   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 I  also recommended that she get compression socks and elevate her feet and legs whenever able.  She has an appointment with Dr. Azucena Cecil on 07/14/21, will route to him for any further possible recommendations.

## 2021-07-08 MED ORDER — FUROSEMIDE 20 MG PO TABS: 20.0000 mg | ORAL_TABLET | Freq: Every day | ORAL | 5 refills | Status: DC

## 2021-07-08 NOTE — Telephone Encounter (Signed)
Called patient and left a detailed VM with the following recommendation:  [name], MD  You 10 minutes ago (3:53 PM)   Start lasix 20mg  daily for edema. Keep follow up appointment.   Encouraged patient to call back with any questions or concerns.

## 2021-07-08 NOTE — Addendum Note (Signed)
Addended by: Gibson Ramp on: 07/08/2021 04:17 PM   Modules accepted: Orders

## 2021-07-14 ENCOUNTER — Encounter: Payer: Self-pay | Admitting: Cardiology

## 2021-07-14 ENCOUNTER — Other Ambulatory Visit: Payer: Self-pay

## 2021-07-14 ENCOUNTER — Ambulatory Visit (INDEPENDENT_AMBULATORY_CARE_PROVIDER_SITE_OTHER): Payer: 59 | Admitting: Cardiology

## 2021-07-14 VITALS — BP 116/60 | HR 70 | Ht 69.0 in | Wt 190.0 lb

## 2021-07-14 DIAGNOSIS — E7800 Pure hypercholesterolemia, unspecified: Secondary | ICD-10-CM | POA: Diagnosis not present

## 2021-07-14 DIAGNOSIS — I10 Essential (primary) hypertension: Secondary | ICD-10-CM | POA: Diagnosis not present

## 2021-07-14 DIAGNOSIS — I2510 Atherosclerotic heart disease of native coronary artery without angina pectoris: Secondary | ICD-10-CM | POA: Diagnosis not present

## 2021-07-14 DIAGNOSIS — I255 Ischemic cardiomyopathy: Secondary | ICD-10-CM

## 2021-07-14 MED ORDER — FUROSEMIDE 40 MG PO TABS: 40.0000 mg | ORAL_TABLET | Freq: Every day | ORAL | 3 refills | Status: DC

## 2021-07-14 NOTE — Progress Notes (Signed)
Cardiology Office Note:    Date:  07/14/2021   ID:  Sarah Duffy, DOB Jan 26, 1971, MRN [id number]  PCP:  [name], MD   [facility]  Cardiologist:  [name], MD  Advanced Practice Provider:  No care team member to display Electrophysiologist:  None       Referring MD: [name], MD   Chief Complaint  Patient presents with   Other    Patient c/o swelling in ankles. Meds reviewed verbally with patient.      History of Present Illness:    Sarah Duffy is a 50 y.o. female with a hx of ischemic cardiomyopathy, EF 35 to 40%, multivessel CAD (80% mLAD, 99% OM 2, 70 to 80% pRCA.) hypertension, hyperlipidemia who presents for follow-up.    Initially seen for chest pain, work-up with echo showed reduced ejection fraction.  Underwent left heart catheter showing multivessel coronary artery disease.  CABG was recommended, procedure delayed due to patient having some sinus infection and wanted a second opinion.  She denies chest pain, denies shortness of breath, has noticed some swelling in her legs.  Was started on Lasix 20 mg daily, with minimal effect.  She plans to contact CT surgery to proceed with CABG shortness initially recommended.  Prior notes Echo 03/12/2021 EF 35 to 40%, moderate MR Left heart cath 04/14/2021 multivessel CAD, 80% mLAD, 99% OM 2, 70 to 80% pRCA.  Past Medical History:  Diagnosis Date   Coronary artery disease    COVID-19 2021   Diabetes mellitus without complication (HCC)    Dyspnea    occasionally    Hypertension    MRSA infection within last 3 months 02/25/2016   Osteomyelitis of foot (HCC) 08/26/2016    Past Surgical History:  Procedure Laterality Date   CHOLECYSTECTOMY  1999   COLONOSCOPY WITH PROPOFOL N/A 03/25/2021   Procedure: COLONOSCOPY WITH PROPOFOL;  Surgeon: [name], MD;  Location: [facility] ENDOSCOPY;  Service: Gastroenterology;  Laterality: N/A;   COLONOSCOPY WITH PROPOFOL N/A 04/17/2021   Procedure:  COLONOSCOPY WITH PROPOFOL;  Surgeon: [name], MD;  Location[facility] ENDOSCOPY;  Service: Gastroenterology;  Laterality: N/A;   ESOPHAGOGASTRODUODENOSCOPY (EGD) WITH PROPOFOL N/A 03/25/2021   Procedure: ESOPHAGOGASTRODUODENOSCOPY (EGD) WITH PROPOFOL;  Surgeon: [name], MD;  Location: [facility] ENDOSCOPY;  Service: Gastroenterology;  Laterality: N/A;   ESOPHAGOGASTRODUODENOSCOPY (EGD) WITH PROPOFOL N/A 04/17/2021   Procedure: ESOPHAGOGASTRODUODENOSCOPY (EGD) WITH PROPOFOL;  Surgeon: [name], MD;  Location: [facility] ENDOSCOPY;  Service: Gastroenterology;  Laterality: N/A;   RIGHT/LEFT HEART CATH AND CORONARY ANGIOGRAPHY Bilateral 04/14/2021   Procedure: RIGHT/LEFT HEART CATH AND CORONARY ANGIOGRAPHY;  Surgeon: Yvonne Kendall, MD;  Location: [facility] INVASIVE CV LAB;  Service: Cardiovascular;  Laterality: Bilateral;   TOE SURGERY Left 02/07/2016   Pinky Toe    Current Medications: Current Meds  Medication Sig   albuterol (VENTOLIN HFA) 108 (90 Base) MCG/ACT inhaler Inhale 2 puffs into the lungs every 6 (six) hours as needed for wheezing or shortness of breath.   aspirin EC 81 MG tablet Take 1 tablet (81 mg total) by mouth daily. Swallow whole.   atorvastatin (LIPITOR) 40 MG tablet Take 1 tablet (40 mg total) by mouth daily.   carvedilol (COREG) 12.5 MG tablet Take 1 tablet (12.5 mg total) by mouth 2 (two) times daily.   furosemide (LASIX) 40 MG tablet Take 1 tablet (40 mg total) by mouth daily.   insulin glargine (LANTUS) 100 UNIT/ML injection Inject 18 Units into the skin daily. In the morning  JARDIANCE 10 MG TABS tablet TAKE 1 TABLET BY MOUTH EVERY DAY   Multiple Vitamins-Minerals (MULTIVITAMIN WITH MINERALS) tablet Take 1 tablet by mouth daily.   nitroGLYCERIN (NITROSTAT) 0.4 MG SL tablet Place 1 tablet (0.4 mg total) under the tongue every 5 (five) minutes as needed for chest pain.   omeprazole (PRILOSEC) 40 MG capsule Take 1 capsule (40 mg total) by mouth daily.   sacubitril-valsartan (ENTRESTO)  24-26 MG Take 1 tablet by mouth 2 (two) times daily.   spironolactone (ALDACTONE) 25 MG tablet Take 0.5 tablets (12.5 mg total) by mouth daily.   [DISCONTINUED] furosemide (LASIX) 20 MG tablet Take 1 tablet (20 mg total) by mouth daily.     Allergies:   Patient has no known allergies.   Social History   Socioeconomic History   Marital status: Single    Spouse name: Not on file   Number of children: 1   Years of education: Not on file   Highest education level: Some college, no degree  Occupational History   Occupation: Dealerhosting and server   Tobacco Use   Smoking status: Never   Smokeless tobacco: Never  Vaping Use   Vaping Use: Never used  Substance and Sexual Activity   Alcohol use: No    Alcohol/week: 0.0 standard drinks   Drug use: No   Sexual activity: Yes    Partners: Male    Birth control/protection: Injection  Other Topics Concern   Not on file  Social History Narrative   She used to work at [employer], but  Feb 2017 she left work because of  MRSA infection.osteomyelitis and uncontrolled DM. She has been back to work since Feb 2018   Lives alone   Social Determinants of Health   Financial Resource Strain: Medium Risk   Difficulty of Paying Living Expenses: Somewhat hard  Food Insecurity: Geophysicist/field seismologistood Insecurity Present   Worried About Programme researcher, broadcasting/film/videounning Out of Food in the Last Year: Often true   Baristaan Out of Food in the Last Year: Often true  Transportation Needs: Personal assistantUnmet Transportation Needs   Lack of Transportation (Medical): Yes   Lack of Transportation (Non-Medical): Yes  Physical Activity: Insufficiently Active   Days of Exercise per Week: 4 days   Minutes of Exercise per Session: 30 min  Stress: Stress Concern Present   Feeling of Stress : Rather much  Social Connections: Moderately Isolated   Frequency of Communication with Friends and Family: More than three times a week   Frequency of Social Gatherings with Friends and Family: More than three times a week   Attends  Religious Services: More than 4 times per year   Active Member of [employer] FinancialClubs or Organizations: No   Attends Engineer, structuralClub or Organization Meetings: Never   Marital Status: Never married     Family History: The patient's family history includes AAA (abdominal aortic aneurysm) in her father; Alzheimer's disease in her father; Breast cancer in her maternal grandmother; Diabetes in her father, maternal grandmother, and mother; Heart attack in her sister; Heart disease in her father; Hypertension in her father; Seizures in her brother; Stroke in her father; Ulcers in her mother.  ROS:   Please see the history of present illness.     All other systems reviewed and are negative.  EKGs/Labs/Other Studies Reviewed:    The following studies were reviewed today:   EKG:  EKG not  ordered today.   Recent Labs: 04/22/2021: ALT 17; BUN 13; Creatinine, Ser 0.93; Hemoglobin 10.5; Platelets 266; Potassium 3.7; Sodium  135  Recent Lipid Panel    Component Value Date/Time   CHOL 297 (H) 02/11/2021 1433   CHOL 208 (H) 02/28/2016 0951   TRIG 86 02/11/2021 1433   HDL 48 (L) 02/11/2021 1433   HDL 42 02/28/2016 0951   CHOLHDL 6.2 (H) 02/11/2021 1433   LDLCALC 229 (H) 02/11/2021 1433     Risk Assessment/Calculations:      Physical Exam:    VS:  BP 116/60 (BP Location: Left Arm, Patient Position: Sitting, Cuff Size: Normal)   Pulse 70   Ht 5\' 9"  (1.753 m)   Wt 190 lb (86.2 kg)   SpO2 98%   BMI 28.06 kg/m     Wt Readings from Last 3 Encounters:  07/14/21 190 lb (86.2 kg)  06/12/21 191 lb (86.6 kg)  05/15/21 192 lb (87.1 kg)     GEN:  Well nourished, well developed in no acute distress HEENT: Normal NECK: No JVD; No carotid bruits LYMPHATICS: No lymphadenopathy CARDIAC: RRR, no murmurs, rubs, gallops RESPIRATORY:  Clear to auscultation without rales, wheezing or rhonchi  ABDOMEN: Soft, non-tender, non-distended MUSCULOSKELETAL:  1+ edema; No deformity  SKIN: Warm and dry NEUROLOGIC:  Alert and  oriented x 3 PSYCHIATRIC:  Normal affect   ASSESSMENT:    1. Coronary artery disease involving native coronary artery of native heart, unspecified whether angina present   2. Ischemic cardiomyopathy   3. Primary hypertension   4. Pure hypercholesterolemia     PLAN:    In order of problems listed above:  Multivessel CAD continue aspirin 81 mg, Lipitor 40 mg daily.  Update fasting lipid profile.  Patient to contact CT surgery for CABG scheduling. Ischemic cardiomyopathy, EF 35 to 40%.  Describes NYHA class II symptoms.  Continue Coreg 6.25 mg twice daily,Entresto 24- 26 mg twice daily, Aldactone.  1+ edema.  Increase Lasix to 40 mg daily, check BMP in 5 days. Hypertension, BP controlled.  Continue Coreg, Entresto, Aldactone as above. Hyperlipidemia, obtain fasting lipid profile, continue Lipitor 40.  Titrate for goal LDL less than 70.  Follow-up in 3 months   Medication Adjustments/Labs and Tests Ordered: Current medicines are reviewed at length with the patient today.  Concerns regarding medicines are outlined above.  Orders Placed This Encounter  Procedures   Basic metabolic panel   Lipid Profile    Meds ordered this encounter  Medications   furosemide (LASIX) 40 MG tablet    Sig: Take 1 tablet (40 mg total) by mouth daily.    Dispense:  30 tablet    Refill:  3    Dose increase     Patient Instructions  Medication Instructions:  - Your physician has recommended you make the following change in your medication:   1) INCREASE lasix (furosemide) to 40 mg- take 1 tablet by mouth once daily   *If you need a refill on your cardiac medications before your next appointment, please call your pharmacy*   Lab Work: - Your physician recommends that you return for FASTING lab work in: 4-5 days- BMP/ Lipid  07/15/21 at [facility] 1st desk on the right to check in, past the screening table Lab hours: Monday- Friday (7:30 am- 5:30 pm)   If you have labs (blood work)  drawn today and your tests are completely normal, you will receive your results only by: MyChart Message (if you have MyChart) OR A paper copy in the mail If you have any lab test that is abnormal or we need to change  your treatment, we will call you to review the results.   Testing/Procedures: - none ordered   Follow-Up: At [facility], you and your health needs are our priority.  As part of our continuing mission to provide you with exceptional heart care, we have created designated Provider Care Teams.  These Care Teams include your primary Cardiologist (physician) and Advanced Practice Providers (APPs -  Physician Assistants and Nurse Practitioners) who all work together to provide you with the care you need, when you need it.  We recommend signing up for the patient portal called "MyChart".  Sign up information is provided on this After Visit Summary.  MyChart is used to connect with patients for Virtual Visits (Telemedicine).  Patients are able to view lab/test results, encounter notes, upcoming appointments, etc.  Non-urgent messages can be sent to your provider as well.   To learn more about what you can do with MyChart, go to [email or web address].    Your next appointment:   3 month(s)  The format for your next appointment:   In Person  Provider:   You may see [name], MD or one of the following Advanced Practice Providers on your designated Care Team:   Nicolasa Ducking, NP Eula Listen, PA-C Marisue Ivan, PA-C Cadence [place], [place]   Other Instructions N/a   Signed, [name], MD  07/14/2021 12:41 PM    [facility]

## 2021-07-14 NOTE — Patient Instructions (Signed)
Medication Instructions:  - Your physician has recommended you make the following change in your medication:   1) INCREASE lasix (furosemide) to 40 mg- take 1 tablet by mouth once daily   *If you need a refill on your cardiac medications before your next appointment, please call your pharmacy*   Lab Work: - Your physician recommends that you return for FASTING lab work in: 4-5 days- BMP/ Lipid  Sales executive at [facility] 1st desk on the right to check in, past the screening table Lab hours: Monday- Friday (7:30 am- 5:30 pm)   If you have labs (blood work) drawn today and your tests are completely normal, you will receive your results only by: MyChart Message (if you have MyChart) OR A paper copy in the mail If you have any lab test that is abnormal or we need to change your treatment, we will call you to review the results.   Testing/Procedures: - none ordered   Follow-Up: At [facility], you and your health needs are our priority.  As part of our continuing mission to provide you with exceptional heart care, we have created designated Provider Care Teams.  These Care Teams include your primary Cardiologist (physician) and Advanced Practice Providers (APPs -  Physician Assistants and Nurse Practitioners) who all work together to provide you with the care you need, when you need it.  We recommend signing up for the patient portal called "MyChart".  Sign up information is provided on this After Visit Summary.  MyChart is used to connect with patients for Virtual Visits (Telemedicine).  Patients are able to view lab/test results, encounter notes, upcoming appointments, etc.  Non-urgent messages can be sent to your provider as well.   To learn more about what you can do with MyChart, go to [email or web address].    Your next appointment:   3 month(s)  The format for your next appointment:   In Person  Provider:   You may see [name], MD or one of the following  Advanced Practice Providers on your designated Care Team:   Nicolasa Ducking, NP Eula Listen, PA-C Marisue Ivan, PA-C Cadence [place], [place]   Other Instructions N/a

## 2021-07-25 ENCOUNTER — Ambulatory Visit: Payer: 59

## 2021-07-31 NOTE — Progress Notes (Signed)
Name: Sarah Duffy   MRN: [id number]    DOB: 1971-04-30   Date:08/04/2021       Progress Note  Subjective  Chief Complaint  Follow  Up  HPI  MDD: she had a gap in her insurance coverage and has been without medications for months. She lost her mother unexpectedly on Feb 4 th , 20.  She has not been going to work since, she is feeling overwhelmed. She states when her mother diet, her sister and sister's boyfriend moved in and has been stressing her out. She was taking duloxetine and Abilify , but states she is doing fine at this time She has not been able to find a psychiatrist that accepts her insurance yet.    DMII: history of left foot ulcer and osteomyelitis, microalbuminuria, hypertension, retinopathy and dyslipidemia. Ulcer finally resolved.  She was on Basglar and victoza but we will try getting approval for Xultophy ( she took in the past) to increase compliance, however today she told me she is on Lantus ( she thinks 20 units) and has been out of Victoza. Advised to return in 2-4 weeks with log of glucose, all medications and we can adjust and PA done for free style libre  HTN: bp is at goal, under the care of cardiologist, reviewed records from Dr. Myriam Forehand and also [facility]   CAD: under the care of Dr. Myriam Forehand, she went for second opinion at [facility] and also recommended CABG , she has not decided where she wants to have surgery yet. She has occasional chest pain, explained goal A1C  should be close to 7 prior to CABG  Conclusions Cath :   Multivessel coronary artery disease including mild to moderate diffuse plaquing of the LAD and RCA.  The mid/distal LAD demonstrates 3 focal lesions of up to 80% that are hemodynamically significant by iFR (iFR = 0.71).  There is also a 99% stenosis of OM2 with faint left to left collaterals supplying the distal branch as well as sequential 60% distal RCA and 70-80% proximal RPDA lesions.   Patient Active Problem List   Diagnosis Date Noted   Proliferative  diabetic retinopathy of both eyes with macular edema associated with type 2 diabetes mellitus (HCC) 05/01/2021   Cardiomyopathy (HCC) 04/14/2021   Osteomyelitis of foot, left, acute (HCC) 06/04/2020   Depression, major, recurrent, mild (HCC) 09/25/2019   History of 2019 novel coronavirus disease (COVID-19) 06/23/2019   Elevated blood pressure reading without diagnosis of hypertension 03/03/2018   Migraine headache without aura 01/14/2018   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(HCC) 05/22/2017   Diabetic retinopathy of left eye, with macular edema, with severe nonproliferative retinopathy, associated with type 2 diabetes mellitus 05/21/2016   Severe nonproliferative diabetic retinopathy of right eye without macular edema associated with type 2 diabetes mellitus (HCC) 05/21/2016   Hyperlipidemia 03/10/2016   MI (mitral incompetence) 02/25/2016   Insomnia 02/25/2016   Uncontrolled type 2 diabetes mellitus with foot ulcer (HCC) 02/03/2016   Anxiety 09/04/2014    Past Surgical History:  Procedure Laterality Date   CHOLECYSTECTOMY  1999   COLONOSCOPY WITH PROPOFOL N/A 03/25/2021   Procedure: COLONOSCOPY WITH PROPOFOL;  Surgeon: [name], MD;  Location: [facility] ENDOSCOPY;  Service: Gastroenterology;  Laterality: N/A;   COLONOSCOPY WITH PROPOFOL N/A 04/17/2021   Procedure: COLONOSCOPY WITH PROPOFOL;  Surgeon: [name], MD;  Location: [facility] ENDOSCOPY;  Service: Gastroenterology;  Laterality: N/A;   ESOPHAGOGASTRODUODENOSCOPY (EGD) WITH PROPOFOL N/A 03/25/2021   Procedure: ESOPHAGOGASTRODUODENOSCOPY (EGD) WITH PROPOFOL;  Surgeon: Tobi Bastos,  Sharlet SalinaKiran, MD;  Location: [facility] ENDOSCOPY;  Service: Gastroenterology;  Laterality: N/A;   ESOPHAGOGASTRODUODENOSCOPY (EGD) WITH PROPOFOL N/A 04/17/2021   Procedure: ESOPHAGOGASTRODUODENOSCOPY (EGD) WITH PROPOFOL;  Surgeon: Wyline MoodAnna, Kiran, MD;  [facility];  Service: Gastroenterology;  Laterality: N/A;   RIGHT/LEFT HEART CATH AND CORONARY ANGIOGRAPHY Bilateral  04/14/2021   Procedure: RIGHT/LEFT HEART CATH AND CORONARY ANGIOGRAPHY;  Surgeon: Yvonne KendallEnd, Christopher, MD;  Location: [facility] INVASIVE CV LAB;  Service: Cardiovascular;  Laterality: Bilateral;   TOE SURGERY Left 02/07/2016   Pinky Toe    Family History  Problem Relation Age of Onset   Diabetes Mother    Ulcers Mother    Heart disease Father    AAA (abdominal aortic aneurysm) Father    Diabetes Father    Hypertension Father    Stroke Father    Alzheimer's disease Father    Heart attack Sister    Seizures Brother    Diabetes Maternal Grandmother    Breast cancer Maternal Grandmother     Social History   Tobacco Use   Smoking status: Never   Smokeless tobacco: Never  Substance Use Topics   Alcohol use: No    Alcohol/week: 0.0 standard drinks     Current Outpatient Medications:    albuterol (VENTOLIN HFA) 108 (90 Base) MCG/ACT inhaler, Inhale 2 puffs into the lungs every 6 (six) hours as needed for wheezing or shortness of breath., Disp: 8 g, Rfl: 0   aspirin EC 81 MG tablet, Take 1 tablet (81 mg total) by mouth daily. Swallow whole., Disp: , Rfl:    carvedilol (COREG) 12.5 MG tablet, Take 1 tablet (12.5 mg total) by mouth 2 (two) times daily., Disp: 60 tablet, Rfl: 1   furosemide (LASIX) 40 MG tablet, Take 1 tablet (40 mg total) by mouth daily., Disp: 30 tablet, Rfl: 3   insulin glargine (LANTUS) 100 UNIT/ML injection, Inject 18 Units into the skin daily. In the morning, Disp: , Rfl:    Multiple Vitamins-Minerals (MULTIVITAMIN WITH MINERALS) tablet, Take 1 tablet by mouth daily., Disp: 100 tablet, Rfl: 0   nitroGLYCERIN (NITROSTAT) 0.4 MG SL tablet, Place 1 tablet (0.4 mg total) under the tongue every 5 (five) minutes as needed for chest pain., Disp: 25 tablet, Rfl: prn   omeprazole (PRILOSEC) 40 MG capsule, Take 1 capsule (40 mg total) by mouth daily., Disp: 90 capsule, Rfl: 3   sacubitril-valsartan (ENTRESTO) 24-26 MG, Take 1 tablet by mouth 2 (two) times daily., Disp: 60 tablet,  Rfl: 1   spironolactone (ALDACTONE) 25 MG tablet, Take 0.5 tablets (12.5 mg total) by mouth daily., Disp: 15 tablet, Rfl: 5   acetaminophen-codeine (TYLENOL #3) 300-30 MG tablet, Take 1 tablet by mouth every 6 (six) hours. (Patient not taking: Reported on 08/04/2021), Disp: , Rfl:    amLODipine (NORVASC) 5 MG tablet, Take 5 mg by mouth daily., Disp: , Rfl:    atorvastatin (LIPITOR) 80 MG tablet, Take 80 mg by mouth daily., Disp: , Rfl:   No Known Allergies  I personally reviewed active problem list, medication list, allergies, family history, social history, health maintenance with the patient/caregiver today.   ROS  Constitutional: Negative for fever or weight change.  Respiratory: Negative for cough and shortness of breath.   Cardiovascular: positive for chest pain but no palpitations.  Gastrointestinal: Negative for abdominal pain, no bowel changes.  Musculoskeletal: Negative for gait problem or joint swelling.  Skin: Negative for rash.  Neurological: Negative for dizziness , positive for intermittent headache.  No other specific complaints  in a complete review of systems (except as listed in HPI above).   Objective  Vitals:   08/04/21 0753  BP: 128/72  Resp: 16  Temp: 98.5 F (36.9 C)  TempSrc: Oral  SpO2: 98%  Weight: 187 lb 11.2 oz (85.1 kg)  Height: 5\' 9"  (1.753 m)    Body mass index is 27.72 kg/m.  Physical Exam  Constitutional: Patient appears well-developed and well-nourished.  No distress.  HEENT: head atraumatic, normocephalic, pupils equal and reactive to light, neck supple Cardiovascular: Normal rate, regular rhythm and normal heart sounds.  No murmur heard. No BLE edema. Pulmonary/Chest: Effort normal and breath sounds normal. No respiratory distress. Abdominal: Soft.  There is no tenderness. Psychiatric: Patient has a normal mood and affect. behavior is normal. Judgment and thought content normal.   Recent Results (from the past 2160 hour(s))  POCT HgB  A1C     Status: Abnormal   Collection Time: 08/04/21  8:00 AM  Result Value Ref Range   Hemoglobin A1C 9.4 (A) 4.0 - 5.6 %   HbA1c POC (<> result, manual entry)     HbA1c, POC (prediabetic range)     HbA1c, POC (controlled diabetic range)        PHQ2/9: Depression screen [facility] 2/9 08/04/2021 06/24/2021 04/29/2021 02/17/2021 02/11/2021  Decreased Interest 0 0 1 1 2   Down, Depressed, Hopeless 0 0 0 1 2  PHQ - 2 Score 0 0 1 2 4   Altered sleeping 0 - 3 1 3   Tired, decreased energy 0 - 1 1 3   Change in appetite 0 - 0 0 1  Feeling bad or failure about yourself  0 - 0 0 1  Trouble concentrating 0 - 0 1 1  Moving slowly or fidgety/restless 0 - 0 0 1  Suicidal thoughts 0 - 0 0 0  PHQ-9 Score 0 - 5 5 14   Difficult doing work/chores Not difficult at all - - Somewhat difficult -  Some recent data might be hidden    phq 9 is negative   Fall Risk: Fall Risk  08/04/2021 06/24/2021 04/29/2021 02/17/2021 02/11/2021  Falls in the past year? 0 0 0 0 1  Number falls in past yr: 0 0 0 0 0  Injury with Fall? 0 0 0 0 1  Follow up - Falls evaluation completed - Falls prevention discussed -      Functional Status Survey: Is the patient deaf or have difficulty hearing?: No Does the patient have difficulty seeing, even when wearing glasses/contacts?: No Does the patient have difficulty concentrating, remembering, or making decisions?: No Does the patient have difficulty walking or climbing stairs?: No Does the patient have difficulty dressing or bathing?: No Does the patient have difficulty doing errands alone such as visiting a doctor's office or shopping?: No    Assessment & Plan  1. 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(HCC)  - POCT HgB A1C - tirzepatide (MOUNJARO) 2.5 MG/0.5ML Pen; Inject 2.5 mg into the skin once a week.  Dispense: 2 mL; Refill: 0 - insulin glargine (LANTUS) 100 UNIT/ML injection; Inject 0.2-0.5 mLs (20-50 Units total) into the skin daily. In the morning  Dispense: 15 mL;  Refill: 0  2. Hypertension associated with diabetes (HCC)  - CBC with Differential/Platelet  3. Depression, major, recurrent, mild (HCC)  She has not been taking any medications   4. Dyslipidemia due to type 2 diabetes mellitus (HCC)  - Lipid panel - COMPLETE METABOLIC PANEL WITH GFR  5. Migraine without aura and without  status migrainosus, not intractable   6. Coronary artery disease due to lipid rich plaque   7. Ischemic cardiomyopathy   8. Angina pectoris associated with type 2 diabetes mellitus (HCC)

## 2021-08-04 ENCOUNTER — Ambulatory Visit (INDEPENDENT_AMBULATORY_CARE_PROVIDER_SITE_OTHER): Payer: 59 | Admitting: Family Medicine

## 2021-08-04 ENCOUNTER — Other Ambulatory Visit: Payer: Self-pay | Admitting: Family Medicine

## 2021-08-04 ENCOUNTER — Encounter: Payer: Self-pay | Admitting: Family Medicine

## 2021-08-04 ENCOUNTER — Other Ambulatory Visit: Payer: Self-pay

## 2021-08-04 VITALS — BP 128/72 | Temp 98.5°F | Resp 16 | Ht 69.0 in | Wt 187.7 lb

## 2021-08-04 DIAGNOSIS — E1129 Type 2 diabetes mellitus with other diabetic kidney complication: Secondary | ICD-10-CM | POA: Diagnosis not present

## 2021-08-04 DIAGNOSIS — R809 Proteinuria, unspecified: Secondary | ICD-10-CM

## 2021-08-04 DIAGNOSIS — E1159 Type 2 diabetes mellitus with other circulatory complications: Secondary | ICD-10-CM

## 2021-08-04 DIAGNOSIS — I209 Angina pectoris, unspecified: Secondary | ICD-10-CM

## 2021-08-04 DIAGNOSIS — G43009 Migraine without aura, not intractable, without status migrainosus: Secondary | ICD-10-CM

## 2021-08-04 DIAGNOSIS — I152 Hypertension secondary to endocrine disorders: Secondary | ICD-10-CM

## 2021-08-04 DIAGNOSIS — Z23 Encounter for immunization: Secondary | ICD-10-CM

## 2021-08-04 DIAGNOSIS — E1169 Type 2 diabetes mellitus with other specified complication: Secondary | ICD-10-CM | POA: Diagnosis not present

## 2021-08-04 DIAGNOSIS — E785 Hyperlipidemia, unspecified: Secondary | ICD-10-CM

## 2021-08-04 DIAGNOSIS — I2510 Atherosclerotic heart disease of native coronary artery without angina pectoris: Secondary | ICD-10-CM

## 2021-08-04 DIAGNOSIS — F330 Major depressive disorder, recurrent, mild: Secondary | ICD-10-CM

## 2021-08-04 DIAGNOSIS — I2583 Coronary atherosclerosis due to lipid rich plaque: Secondary | ICD-10-CM

## 2021-08-04 DIAGNOSIS — I255 Ischemic cardiomyopathy: Secondary | ICD-10-CM

## 2021-08-04 LAB — LIPID PANEL
Cholesterol: 168 mg/dL (ref <200)
HDL: 34 mg/dL — ABNORMAL LOW (ref >=50)
LDL Cholesterol (Calc): 115 mg/dL (calc) — ABNORMAL HIGH
Non-HDL Cholesterol (Calc): 134 mg/dL (calc) — ABNORMAL HIGH (ref <130)
Total CHOL/HDL Ratio: 4.9 (calc) (ref <5.0)
Triglycerides: 89 mg/dL (ref <150)

## 2021-08-04 LAB — COMPLETE METABOLIC PANEL WITH GFR
AG Ratio: 1.4 (calc) (ref 1.0–2.5)
ALT: 15 U/L (ref 6–29)
AST: 17 U/L (ref 10–35)
Albumin: 4.1 g/dL (ref 3.6–5.1)
Alkaline phosphatase (APISO): 65 U/L (ref 37–153)
BUN/Creatinine Ratio: 24 (calc) — ABNORMAL HIGH (ref 6–22)
BUN: 28 mg/dL — ABNORMAL HIGH (ref 7–25)
CO2: 25 mmol/L (ref 20–32)
Calcium: 9.4 mg/dL (ref 8.6–10.4)
Chloride: 107 mmol/L (ref 98–110)
Creat: 1.17 mg/dL — ABNORMAL HIGH (ref 0.50–1.03)
Globulin: 2.9 g/dL (calc) (ref 1.9–3.7)
Glucose, Bld: 248 mg/dL — ABNORMAL HIGH (ref 65–99)
Potassium: 4.5 mmol/L (ref 3.5–5.3)
Sodium: 139 mmol/L (ref 135–146)
Total Bilirubin: 0.9 mg/dL (ref 0.2–1.2)
Total Protein: 7 g/dL (ref 6.1–8.1)
eGFR: 56 mL/min/{1.73_m2} — ABNORMAL LOW (ref >=60)

## 2021-08-04 LAB — CBC WITH DIFFERENTIAL/PLATELET
Absolute Monocytes: 600 cells/uL (ref 200–950)
Basophils Absolute: 53 cells/uL (ref 0–200)
Basophils Relative: 0.7 %
Eosinophils Absolute: 150 cells/uL (ref 15–500)
Eosinophils Relative: 2 %
HCT: 31.7 % — ABNORMAL LOW (ref 35.0–45.0)
Hemoglobin: 10.4 g/dL — ABNORMAL LOW (ref 11.7–15.5)
Lymphs Abs: 2003 cells/uL (ref 850–3900)
MCH: 28.8 pg (ref 27.0–33.0)
MCHC: 32.8 g/dL (ref 32.0–36.0)
MCV: 87.8 fL (ref 80.0–100.0)
MPV: 9.9 fL (ref 7.5–12.5)
Monocytes Relative: 8 %
Neutro Abs: 4695 cells/uL (ref 1500–7800)
Neutrophils Relative %: 62.6 %
Platelets: 245 10*3/uL (ref 140–400)
RBC: 3.61 10*6/uL — ABNORMAL LOW (ref 3.80–5.10)
RDW: 12.7 % (ref 11.0–15.0)
Total Lymphocyte: 26.7 %
WBC: 7.5 10*3/uL (ref 3.8–10.8)

## 2021-08-04 LAB — POCT GLYCOSYLATED HEMOGLOBIN (HGB A1C): Hemoglobin A1C: 9.4 % — AB (ref 4.0–5.6)

## 2021-08-04 MED ORDER — TIRZEPATIDE 2.5 MG/0.5ML ~~LOC~~ SOAJ: 2.5000 mg | SUBCUTANEOUS | 0 refills | Status: DC

## 2021-08-04 MED ORDER — INSULIN GLARGINE 100 UNIT/ML ~~LOC~~ SOLN: 20.0000 [IU] | Freq: Every day | SUBCUTANEOUS | 0 refills | Status: DC

## 2021-08-04 NOTE — Telephone Encounter (Signed)
Last seen 8.22.2022 upcoming sch'd for 9.8.2022

## 2021-08-04 NOTE — Telephone Encounter (Signed)
  Notes to clinic:  medication just filled today  Review for next refill    Requested Prescriptions  Pending Prescriptions Disp Refills   MOUNJARO 2.5 MG/0.5ML Pen [Pharmacy Med Name: MOUNJARO 2.5MG /0.5ML PF PEN INJ] 2 mL 0    Sig: INJECT 2.5 MG UNDER THE SKIN ONCE A WEEK     There is no refill protocol information for this order

## 2021-08-05 ENCOUNTER — Other Ambulatory Visit: Payer: Self-pay | Admitting: Family Medicine

## 2021-08-05 DIAGNOSIS — J069 Acute upper respiratory infection, unspecified: Secondary | ICD-10-CM

## 2021-08-05 NOTE — Telephone Encounter (Signed)
Last seen 8.22.2022 upcoming sch'd for 9.8.2022 

## 2021-08-05 NOTE — Telephone Encounter (Signed)
Requested medications are due for refill today yes  Requested medications are on the active medication list yes  Last refill 06/24/21  Last visit 7/12 for resp infection, 08/04/21  Future visit scheduled 08/2021  Notes to clinic Unsure if this was to be continued, ordered for respiratory infection, please assess.

## 2021-08-08 ENCOUNTER — Telehealth: Payer: Self-pay | Admitting: Family Medicine

## 2021-08-08 NOTE — Telephone Encounter (Signed)
Awaiting PA information

## 2021-08-08 NOTE — Telephone Encounter (Signed)
Pt is going to pick up her free box today of this medication.  However, the pharmacist says this med is not on her formulary list, so they may not pay for it, even with a prior.  She is sending a prior auth on the new Rx anyway so you can find out ahead of time  tirzepatide [facility]) 2.5 MG/0.5ML Pen

## 2021-08-13 ENCOUNTER — Other Ambulatory Visit: Payer: Self-pay

## 2021-08-13 MED ORDER — LINACLOTIDE 290 MCG PO CAPS: 290.0000 ug | ORAL_CAPSULE | Freq: Every day | ORAL | 3 refills | Status: DC

## 2021-08-20 ENCOUNTER — Ambulatory Visit: Payer: 59 | Admitting: Surgery

## 2021-08-20 NOTE — Progress Notes (Signed)
Name: Sarah Duffy   MRN: [id number]    DOB: Oct 20, 1971   Date:08/21/2021       Progress Note  Subjective  Chief Complaint  Follow Up- Diabetes  HPI    DMII: history of left foot ulcer and osteomyelitis, microalbuminuria, hypertension, retinopathy and dyslipidemia. Ulcer finally resolved.  She is currently on Lantus 30 units and Mounjaro. Glucose has been better controlled last few days below 170, sometimes goes down to 70's, but still spikes occasionally to 300's. She is not drinking sugary drinks, cutting down on bread, also avoiding potato chips. She denies side effects of medication. She has noticed some blurred vision on the right eye, advised her to contact ophthalmologist asap   HTN: bp is at goal, under the care of cardiologist, reviewed records from Dr. Mylo Red and also [facility]   CAD: under the care of Dr. Mylo Red, she went for second opinion at [facility] and also recommended CABG , she will has a follow up with cardiovascular surgeon Dr. Cyndia Bent in [place] to discuss when CABG will be done.  She states no recent episodes of chest pain. Explained goal A1C  should be close to 7 prior to CABG  Conclusions Cath :   Multivessel coronary artery disease including mild to moderate diffuse plaquing of the LAD and RCA.  The mid/distal LAD demonstrates 3 focal lesions of up to 80% that are hemodynamically significant by iFR (iFR = 0.71).  There is also a 99% stenosis of OM2 with faint left to left collaterals supplying the distal branch as well as sequential 60% distal RCA and 70-80% proximal RPDA lesions.  Patient Active Problem List   Diagnosis Date Noted   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 edema associated with type 2 diabetes mellitus ([place]) 05/01/2021   Cardiomyopathy ([place]) 04/14/2021   Osteomyelitis of foot, left, acute ([place]) 06/04/2020   Depression, major, recurrent, mild ([place]) 09/25/2019   History of 2019 novel coronavirus disease (COVID-19) 06/23/2019   Elevated blood  pressure reading without diagnosis of hypertension 03/03/2018   Migraine headache without aura 01/14/2018   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([place]) 05/22/2017   Diabetic retinopathy of left eye, with macular edema, with severe nonproliferative retinopathy, associated with type 2 diabetes mellitus 05/21/2016   Severe nonproliferative diabetic retinopathy of right eye without macular edema associated with type 2 diabetes mellitus ([place]) 05/21/2016   Hyperlipidemia 03/10/2016   MI (mitral incompetence) 02/25/2016   Insomnia 02/25/2016   Uncontrolled type 2 diabetes mellitus with foot ulcer ([place]) 02/03/2016   Anxiety 09/04/2014    Past Surgical History:  Procedure Laterality Date   CHOLECYSTECTOMY  1999   COLONOSCOPY WITH PROPOFOL N/A 03/25/2021   Procedure: COLONOSCOPY WITH PROPOFOL;  Surgeon: Jonathon Bellows, MD;  Location: [facility] ENDOSCOPY;  Service: Gastroenterology;  Laterality: N/A;   COLONOSCOPY WITH PROPOFOL N/A 04/17/2021   Procedure: COLONOSCOPY WITH PROPOFOL;  Surgeon: Jonathon Bellows, MD;  Location[facility] ENDOSCOPY;  Service: Gastroenterology;  Laterality: N/A;   ESOPHAGOGASTRODUODENOSCOPY (EGD) WITH PROPOFOL N/A 03/25/2021   Procedure: ESOPHAGOGASTRODUODENOSCOPY (EGD) WITH PROPOFOL;  Surgeon: Jonathon Bellows, MD;  Location: [facility] ENDOSCOPY;  Service: Gastroenterology;  Laterality: N/A;   ESOPHAGOGASTRODUODENOSCOPY (EGD) WITH PROPOFOL N/A 04/17/2021   Procedure: ESOPHAGOGASTRODUODENOSCOPY (EGD) WITH PROPOFOL;  Surgeon: Jonathon Bellows, MD;  Location: [facility] ENDOSCOPY;  Service: Gastroenterology;  Laterality: N/A;   RIGHT/LEFT HEART CATH AND CORONARY ANGIOGRAPHY Bilateral 04/14/2021   Procedure: RIGHT/LEFT HEART CATH AND CORONARY ANGIOGRAPHY;  Surgeon: Nelva Bush, MD;  Location: [facility] CV LAB;  Service: Cardiovascular;  Laterality:  Bilateral;   TOE SURGERY Left 02/07/2016   Pinky Toe    Family History  Problem Relation Age of Onset   Diabetes Mother    Ulcers Mother    Heart disease  Father    AAA (abdominal aortic aneurysm) Father    Diabetes Father    Hypertension Father    Stroke Father    Alzheimer's disease Father    Heart attack Sister    Seizures Brother    Diabetes Maternal Grandmother    Breast cancer Maternal Grandmother     Social History   Tobacco Use   Smoking status: Never   Smokeless tobacco: Never  Substance Use Topics   Alcohol use: No    Alcohol/week: 0.0 standard drinks     Current Outpatient Medications:    albuterol (VENTOLIN HFA) 108 (90 Base) MCG/ACT inhaler, INHALE 2 PUFFS INTO THE LUNGS EVERY 6 HOURS AS NEEDED FOR WHEEZING OR SHORTNESS OF BREATH, Disp: 6.7 g, Rfl: 5   amLODipine (NORVASC) 5 MG tablet, Take 5 mg by mouth daily., Disp: , Rfl:    aspirin EC 81 MG tablet, Take 1 tablet (81 mg total) by mouth daily. Swallow whole., Disp: , Rfl:    atorvastatin (LIPITOR) 80 MG tablet, Take 80 mg by mouth daily., Disp: , Rfl:    carvedilol (COREG) 12.5 MG tablet, Take 1 tablet (12.5 mg total) by mouth 2 (two) times daily., Disp: 60 tablet, Rfl: 1   furosemide (LASIX) 40 MG tablet, Take 1 tablet (40 mg total) by mouth daily., Disp: 30 tablet, Rfl: 3   insulin glargine (LANTUS) 100 UNIT/ML injection, Inject 0.2-0.5 mLs (20-50 Units total) into the skin daily. In the morning, Disp: 15 mL, Rfl: 0   Multiple Vitamins-Minerals (MULTIVITAMIN WITH MINERALS) tablet, Take 1 tablet by mouth daily., Disp: 100 tablet, Rfl: 0   nitroGLYCERIN (NITROSTAT) 0.4 MG SL tablet, Place 1 tablet (0.4 mg total) under the tongue every 5 (five) minutes as needed for chest pain., Disp: 25 tablet, Rfl: prn   omeprazole (PRILOSEC) 40 MG capsule, Take 1 capsule (40 mg total) by mouth daily., Disp: 90 capsule, Rfl: 3   sacubitril-valsartan (ENTRESTO) 24-26 MG, Take 1 tablet by mouth 2 (two) times daily., Disp: 60 tablet, Rfl: 1   tirzepatide (MOUNJARO) 2.5 MG/0.5ML Pen, Inject 2.5 mg into the skin once a week., Disp: 2 mL, Rfl: 0   acetaminophen-codeine (TYLENOL #3) 300-30  MG tablet, Take 1 tablet by mouth every 6 (six) hours. (Patient not taking: No sig reported), Disp: , Rfl:    linaclotide (LINZESS) 290 MCG CAPS capsule, Take 1 capsule (290 mcg total) by mouth daily. (Patient not taking: Reported on 08/21/2021), Disp: 30 capsule, Rfl: 3   spironolactone (ALDACTONE) 25 MG tablet, Take 0.5 tablets (12.5 mg total) by mouth daily., Disp: 15 tablet, Rfl: 5  No Known Allergies  I personally reviewed active problem list, medication list, allergies, family history, social history, health maintenance with the patient/caregiver today.   ROS  Constitutional: Negative for fever or weight change.  Respiratory: Negative for cough and shortness of breath.   Cardiovascular: Negative for chest pain or palpitations.  Gastrointestinal: Negative for abdominal pain, no bowel changes.  Musculoskeletal: Negative for gait problem or joint swelling.  Skin: Negative for rash.  Neurological: Negative for dizziness or headache.  No other specific complaints in a complete review of systems (except as listed in HPI above).   Objective  Vitals:   08/21/21 1033  BP: 120/76  Pulse: 92  Resp:  16  Temp: 98.2 F (36.8 C)  SpO2: 99%  Weight: 184 lb (83.5 kg)  Height: 5' 9"  (1.753 m)    Body mass index is 27.17 kg/m.  Physical Exam  Constitutional: Patient appears well-developed and well-nourished. Overweight.  No distress.  HEENT: head atraumatic, normocephalic, pupils equal and reactive to light, neck supple Cardiovascular: Normal rate, regular rhythm and normal heart sounds.  No murmur heard. No BLE edema. Pulmonary/Chest: Effort normal and breath sounds normal. No respiratory distress. Abdominal: Soft.  There is no tenderness. Psychiatric: Patient has a normal mood and affect. behavior is normal. Judgment and thought content normal.   Recent Results (from the past 2160 hour(s))  POCT HgB A1C     Status: Abnormal   Collection Time: 08/04/21  8:00 AM  Result Value Ref  Range   Hemoglobin A1C 9.4 (A) 4.0 - 5.6 %   HbA1c POC (<> result, manual entry)     HbA1c, POC (prediabetic range)     HbA1c, POC (controlled diabetic range)    Lipid panel     Status: Abnormal   Collection Time: 08/04/21  8:42 AM  Result Value Ref Range   Cholesterol 168 <200 mg/dL   HDL 34 (L) > OR = 50 mg/dL   Triglycerides 89 <150 mg/dL   LDL Cholesterol (Calc) 115 (H) mg/dL (calc)    Comment: Reference range: <100 . Desirable range <100 mg/dL for primary prevention;   <70 mg/dL for patients with CHD or diabetic patients  with > or = 2 CHD risk factors. Marland Kitchen LDL-C is now calculated using the Martin-Hopkins  calculation, which is a validated novel method providing  better accuracy than the Friedewald equation in the  estimation of LDL-C.  Cresenciano Genre et al. Annamaria Helling. 1517;616(07): 2061-2068  (http://education.[employer].com/faq/FAQ164)    Total CHOL/HDL Ratio 4.9 <5.0 (calc)   Non-HDL Cholesterol (Calc) 134 (H) <130 mg/dL (calc)    Comment: For patients with diabetes plus 1 major ASCVD risk  factor, treating to a non-HDL-C goal of <100 mg/dL  (LDL-C of <70 mg/dL) is considered a therapeutic  option.   COMPLETE METABOLIC PANEL WITH GFR     Status: Abnormal   Collection Time: 08/04/21  8:42 AM  Result Value Ref Range   Glucose, Bld 248 (H) 65 - 99 mg/dL    Comment: .            Fasting reference interval . For someone without known diabetes, a glucose value >125 mg/dL indicates that they may have diabetes and this should be confirmed with a follow-up test. .    BUN 28 (H) 7 - 25 mg/dL   Creat 1.17 (H) 0.50 - 1.03 mg/dL   eGFR 56 (L) > OR = 60 mL/min/1.47m    Comment: The eGFR is based on the CKD-EPI 2021 equation. To calculate  the new eGFR from a previous Creatinine or Cystatin C result, go to https://www.kidney.org/professionals/ kdoqi/gfr%5Fcalculator    BUN/Creatinine Ratio 24 (H) 6 - 22 (calc)   Sodium 139 135 - 146 mmol/L   Potassium 4.5 3.5 - 5.3 mmol/L    Chloride 107 98 - 110 mmol/L   CO2 25 20 - 32 mmol/L   Calcium 9.4 8.6 - 10.4 mg/dL   Total Protein 7.0 6.1 - 8.1 g/dL   Albumin 4.1 3.6 - 5.1 g/dL   Globulin 2.9 1.9 - 3.7 g/dL (calc)   AG Ratio 1.4 1.0 - 2.5 (calc)   Total Bilirubin 0.9 0.2 - 1.2 mg/dL   Alkaline phosphatase (  APISO) 65 37 - 153 U/L   AST 17 10 - 35 U/L   ALT 15 6 - 29 U/L  CBC with Differential/Platelet     Status: Abnormal   Collection Time: 08/04/21  8:42 AM  Result Value Ref Range   WBC 7.5 3.8 - 10.8 Thousand/uL   RBC 3.61 (L) 3.80 - 5.10 Million/uL   Hemoglobin 10.4 (L) 11.7 - 15.5 g/dL   HCT 31.7 (L) 35.0 - 45.0 %   MCV 87.8 80.0 - 100.0 fL   MCH 28.8 27.0 - 33.0 pg   MCHC 32.8 32.0 - 36.0 g/dL   RDW 12.7 11.0 - 15.0 %   Platelets 245 140 - 400 Thousand/uL   MPV 9.9 7.5 - 12.5 fL   Neutro Abs 4,695 1,500 - 7,800 cells/uL   Lymphs Abs 2,003 850 - 3,900 cells/uL   Absolute Monocytes 600 200 - 950 cells/uL   Eosinophils Absolute 150 15 - 500 cells/uL   Basophils Absolute 53 0 - 200 cells/uL   Neutrophils Relative % 62.6 %   Total Lymphocyte 26.7 %   Monocytes Relative 8.0 %   Eosinophils Relative 2.0 %   Basophils Relative 0.7 %      PHQ2/9: Depression screen [facility] 2/9 08/21/2021 08/04/2021 06/24/2021 04/29/2021 02/17/2021  Decreased Interest 0 0 0 1 1  Down, Depressed, Hopeless 0 0 0 0 1  PHQ - 2 Score 0 0 0 1 2  Altered sleeping - 0 - 3 1  Tired, decreased energy - 0 - 1 1  Change in appetite - 0 - 0 0  Feeling bad or failure about yourself  - 0 - 0 0  Trouble concentrating - 0 - 0 1  Moving slowly or fidgety/restless - 0 - 0 0  Suicidal thoughts - 0 - 0 0  PHQ-9 Score - 0 - 5 5  Difficult doing work/chores - Not difficult at all - - Somewhat difficult  Some recent data might be hidden    phq 9 is negative   Fall Risk: Fall Risk  08/21/2021 08/04/2021 06/24/2021 04/29/2021 02/17/2021  Falls in the past year? 0 0 0 0 0  Number falls in past yr: 0 0 0 0 0  Injury with Fall? 0 0 0 0 0  Risk for  fall due to : No Fall Risks - - - -  Follow up Falls prevention discussed - Falls evaluation completed - Falls prevention discussed      Functional Status Survey: Is the patient deaf or have difficulty hearing?: No Does the patient have difficulty seeing, even when wearing glasses/contacts?: No Does the patient have difficulty concentrating, remembering, or making decisions?: No Does the patient have difficulty walking or climbing stairs?: No Does the patient have difficulty dressing or bathing?: No Does the patient have difficulty doing errands alone such as visiting a doctor's office or shopping?: No    Assessment & Plan  1. Need for immunization against influenza  - Flu Vaccine QUAD 30moIM (Fluarix, Fluzone & Alfiuria Quad PF)  2. 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(HCC)  - tirzepatide (MOUNJARO) 5 MG/0.5ML Pen; Inject 5 mg into the skin once a week. Titrating dose every month until follow up  Dispense: 2 mL; Refill: 0 - blood glucose meter kit and supplies; Dispense based on patient and insurance preference. Use up to four times daily as directed. (FOR ICD-10 E10.9, E11.9).  Dispense: 1 each; Refill: 0  3. Hy[place] associated with diabetes ([place]  - amLODipine (NORVASC) 5 MG tablet;  Take 1 tablet (5 mg total) by mouth daily.  Dispense: 90 tablet; Refill: 0

## 2021-08-21 ENCOUNTER — Other Ambulatory Visit: Payer: Self-pay

## 2021-08-21 ENCOUNTER — Ambulatory Visit (INDEPENDENT_AMBULATORY_CARE_PROVIDER_SITE_OTHER): Payer: 59 | Admitting: Family Medicine

## 2021-08-21 ENCOUNTER — Encounter: Payer: Self-pay | Admitting: Family Medicine

## 2021-08-21 VITALS — BP 120/76 | HR 92 | Temp 98.2°F | Resp 16 | Ht 69.0 in | Wt 184.0 lb

## 2021-08-21 DIAGNOSIS — E1159 Type 2 diabetes mellitus with other circulatory complications: Secondary | ICD-10-CM | POA: Diagnosis not present

## 2021-08-21 DIAGNOSIS — Z23 Encounter for immunization: Secondary | ICD-10-CM | POA: Diagnosis not present

## 2021-08-21 DIAGNOSIS — I152 Hypertension secondary to endocrine disorders: Secondary | ICD-10-CM

## 2021-08-21 DIAGNOSIS — E1129 Type 2 diabetes mellitus with other diabetic kidney complication: Secondary | ICD-10-CM | POA: Diagnosis not present

## 2021-08-21 DIAGNOSIS — R809 Proteinuria, unspecified: Secondary | ICD-10-CM | POA: Diagnosis not present

## 2021-08-21 MED ORDER — AMLODIPINE BESYLATE 5 MG PO TABS: 5.0000 mg | ORAL_TABLET | Freq: Every day | ORAL | 0 refills | Status: DC

## 2021-08-21 MED ORDER — TIRZEPATIDE 5 MG/0.5ML ~~LOC~~ SOAJ: 5.0000 mg | SUBCUTANEOUS | 0 refills | Status: DC

## 2021-08-21 MED ORDER — BLOOD GLUCOSE METER KIT
PACK | 0 refills | Status: DC
Start: 2021-08-21 — End: 2023-03-04

## 2021-08-21 NOTE — Patient Instructions (Signed)
Lantus:  Go up by 2 units every 3 days if glucose above 140 Go down by 2 units every 3 days if glucose below 100  Stay on the current dose of 30 ui is glucose is between 100-140

## 2021-08-25 ENCOUNTER — Ambulatory Visit (INDEPENDENT_AMBULATORY_CARE_PROVIDER_SITE_OTHER): Payer: 59 | Admitting: Surgery

## 2021-08-25 ENCOUNTER — Other Ambulatory Visit: Payer: Self-pay | Admitting: *Deleted

## 2021-08-25 ENCOUNTER — Encounter: Payer: Self-pay | Admitting: *Deleted

## 2021-08-25 ENCOUNTER — Other Ambulatory Visit: Payer: Self-pay

## 2021-08-25 ENCOUNTER — Encounter: Payer: Self-pay | Admitting: Surgery

## 2021-08-25 VITALS — BP 104/66 | HR 88 | Resp 20 | Ht 69.0 in | Wt 183.0 lb

## 2021-08-25 DIAGNOSIS — I2510 Atherosclerotic heart disease of native coronary artery without angina pectoris: Secondary | ICD-10-CM | POA: Diagnosis not present

## 2021-08-25 MED ORDER — CARVEDILOL 12.5 MG PO TABS: 12.5000 mg | ORAL_TABLET | Freq: Two times a day (BID) | ORAL | 2 refills | Status: DC

## 2021-08-25 NOTE — Progress Notes (Signed)
HPI:  The patient is a 50 year old woman with a history of hypertension, hyperlipidemia, and diabetes who developed left-sided chest discomfort as well as pain in her left shoulder and upper back in early March.  The symptoms lasted for several days and she has had no recurrence.  She denies any shortness of breath.  She has not noticed some decreased energy level and dizzy spells.  She denies orthopnea and peripheral edema.  She was seen by cardiology on 02/21/2021.  2D echocardiogram on 03/12/2021 showed moderate LV dysfunction with ejection fraction of 35 to 40% with global hypokinesis.  There was moderate mitral regurgitation.  She subsequently underwent cardiac catheterization on 04/14/2021 showing severe multivessel coronary disease.  The LAD had 70% proximal to mid stenosis, 60% mid stenosis, and 80% distal stenosis.  The left circumflex had a moderate sized second marginal branch that had 99% stenosis.  The right coronary artery was diffusely diseased with 60% distal stenosis.  The PDA had 75% proximal stenosis.  Right heart catheterization showed PA pressure of 26/16 with a wedge pressure of 15.  Right atrial pressure was 8.  Cardiac index was 2.8 and PA sat was 75%.  I saw her in the office on 04/23/2021 and schedule coronary bypass graft surgery which she has canceled twice.  She said that she is now prepared to undergo surgery.  She has occasional mild chest discomfort with exertion.  She has exertional fatigue and shortness of breath but has continued to work.  Current Outpatient Medications  Medication Sig Dispense Refill   albuterol (VENTOLIN HFA) 108 (90 Base) MCG/ACT inhaler INHALE 2 PUFFS INTO THE LUNGS EVERY 6 HOURS AS NEEDED FOR WHEEZING OR SHORTNESS OF BREATH 6.7 g 5   amLODipine (NORVASC) 5 MG tablet Take 1 tablet (5 mg total) by mouth daily. 90 tablet 0   aspirin EC 81 MG tablet Take 1 tablet (81 mg total) by mouth daily. Swallow whole.     atorvastatin (LIPITOR) 80 MG tablet Take 80  mg by mouth daily.     blood glucose meter kit and supplies Dispense based on patient and insurance preference. Use up to four times daily as directed. (FOR ICD-10 E10.9, E11.9). 1 each 0   carvedilol (COREG) 12.5 MG tablet Take 1 tablet (12.5 mg total) by mouth 2 (two) times daily. 60 tablet 2   furosemide (LASIX) 40 MG tablet Take 1 tablet (40 mg total) by mouth daily. 30 tablet 3   insulin glargine (LANTUS) 100 UNIT/ML injection Inject 0.2-0.5 mLs (20-50 Units total) into the skin daily. In the morning 15 mL 0   Multiple Vitamins-Minerals (MULTIVITAMIN WITH MINERALS) tablet Take 1 tablet by mouth daily. 100 tablet 0   nitroGLYCERIN (NITROSTAT) 0.4 MG SL tablet Place 1 tablet (0.4 mg total) under the tongue every 5 (five) minutes as needed for chest pain. 25 tablet prn   omeprazole (PRILOSEC) 40 MG capsule Take 1 capsule (40 mg total) by mouth daily. 90 capsule 3   sacubitril-valsartan (ENTRESTO) 24-26 MG Take 1 tablet by mouth 2 (two) times daily. 60 tablet 1   tirzepatide (MOUNJARO) 5 MG/0.5ML Pen Inject 5 mg into the skin once a week. Titrating dose every month until follow up 2 mL 0   spironolactone (ALDACTONE) 25 MG tablet Take 0.5 tablets (12.5 mg total) by mouth daily. 15 tablet 5   No current facility-administered medications for this visit.     Physical Exam: BP 104/66 (BP Location: Left Arm, Patient Position: Sitting)  Pulse 88   Resp 20   Ht 5' 9"  (1.753 m)   Wt 183 lb (83 kg)   SpO2 96% Comment: RA  BMI 27.02 kg/m  She looks well. Cardiac exam shows regular rate and rhythm with normal heart sounds.  There is no murmur. Lungs are clear. There is mild lower extremity edema.  Diagnostic Tests:  None today  Impression:  This 50 year old diabetic woman has severe three-vessel coronary disease with moderate left ventricular systolic dysfunction and an ejection fraction of 35 to 40% with grade 1 diastolic dysfunction.  She has moderate mitral valve regurgitation by  echocardiogram in March 2022.  It is felt that coronary artery bypass graft surgery is the best treatment prevent further ischemia and infarction.  Her upper extremity arterial Dopplers showed no change in the left palmar signal with radial compression and therefore I think we can use a left internal mammary graft to the LAD, a left radial graft to a 99% stenosed obtuse marginal branch, and a sequential vein graft to the PDA and PL on the right coronary artery. I discussed the operative procedure with the patient including alternatives, benefits and risks; including but not limited to bleeding, blood transfusion, infection, stroke, myocardial infarction, graft failure, heart block requiring a permanent pacemaker, organ dysfunction, and death.  [name] understands and agrees to proceed.    Plan:  She would like to schedule coronary bypass graft surgery with a left radial artery graft for Friday, 09/19/2021.  I spent 15 minutes performing this established patient evaluation and > 50% of this time was spent face to face counseling and coordinating the care of this patient's severe multivessel coronary disease.   Gaye Pollack, MD [facility] [phone]

## 2021-08-29 ENCOUNTER — Other Ambulatory Visit: Payer: Self-pay | Admitting: Family Medicine

## 2021-08-29 ENCOUNTER — Ambulatory Visit: Payer: 59 | Admitting: Cardiology

## 2021-08-29 DIAGNOSIS — R809 Proteinuria, unspecified: Secondary | ICD-10-CM

## 2021-08-29 DIAGNOSIS — E1129 Type 2 diabetes mellitus with other diabetic kidney complication: Secondary | ICD-10-CM

## 2021-09-01 ENCOUNTER — Other Ambulatory Visit: Payer: Self-pay

## 2021-09-01 MED ORDER — ENTRESTO 24-26 MG PO TABS: 1.0000 | ORAL_TABLET | Freq: Two times a day (BID) | ORAL | 1 refills | Status: DC

## 2021-09-02 ENCOUNTER — Telehealth: Payer: Self-pay | Admitting: Family Medicine

## 2021-09-02 NOTE — Telephone Encounter (Signed)
Pt called and stated that her insurance company advised her that the office needs to resubmit the PA for tirzepatide [facility]) 5 MG/0.5ML Pen / please advise

## 2021-09-02 NOTE — Telephone Encounter (Signed)
In progress

## 2021-09-05 ENCOUNTER — Other Ambulatory Visit: Payer: Self-pay | Admitting: Family Medicine

## 2021-09-05 DIAGNOSIS — E1129 Type 2 diabetes mellitus with other diabetic kidney complication: Secondary | ICD-10-CM

## 2021-09-05 DIAGNOSIS — R809 Proteinuria, unspecified: Secondary | ICD-10-CM

## 2021-09-05 NOTE — Telephone Encounter (Signed)
Requested medication (s) are due for refill today - no   Requested medication (s) are on the active medication list -no  Future visit scheduled -no  Last refill: 08/08/21  Notes to clinic: request RF: no longer current dosing  Requested Prescriptions  Pending Prescriptions Disp Refills   MOUNJARO 2.5 MG/0.5ML Pen [Pharmacy Med Name: MOUNJARO 2.5MG /0.5ML PF PEN INJ] 2 mL 0    Sig: INJECT 2.5 MG UNDER THE SKIN ONCE A WEEK     There is no refill protocol information for this order       Requested Prescriptions  Pending Prescriptions Disp Refills   MOUNJARO 2.5 MG/0.5ML Pen [Pharmacy Med Name: MOUNJARO 2.5MG /0.5ML PF PEN INJ] 2 mL 0    Sig: INJECT 2.5 MG UNDER THE SKIN ONCE A WEEK     There is no refill protocol information for this order

## 2021-09-05 NOTE — Telephone Encounter (Signed)
Next appt is 04/2022

## 2021-09-10 ENCOUNTER — Telehealth: Payer: Self-pay

## 2021-09-10 NOTE — Telephone Encounter (Signed)
Patient left a voicemail stating the Linzess is over 600 dollars on her insurance and would like something cheaper

## 2021-09-12 MED ORDER — LUBIPROSTONE 8 MCG PO CAPS: 8.0000 ug | ORAL_CAPSULE | Freq: Two times a day (BID) | ORAL | 2 refills | Status: DC

## 2021-09-12 NOTE — Telephone Encounter (Signed)
Dr.Anna, I tried getting her linzess approved but it was denied. What else can you recommend for her to try. Please advise.

## 2021-09-12 NOTE — Addendum Note (Signed)
Addended by: Adela Ports on: 09/12/2021 10:42 AM   Modules accepted: Orders

## 2021-09-16 ENCOUNTER — Other Ambulatory Visit: Payer: Self-pay

## 2021-09-16 ENCOUNTER — Telehealth: Payer: Self-pay

## 2021-09-16 NOTE — Telephone Encounter (Signed)
Called patient insurance company and did PA for medication on the phone. They would like [place] to fax the last office visit to them at [phone]. Fax this to them

## 2021-09-16 NOTE — Progress Notes (Signed)
Surgical Instructions    Your procedure is scheduled on 09/19/21.  Report to [facility] Main Entrance "A" at 5:30 A.M., then check in with the Admitting office.  Call this number if you have problems the morning of surgery:  [phone]   If you have any questions prior to your surgery date call [phone]: Open Monday-Friday 8am-4pm    Remember:  Do not eat or drink after midnight the night before your surgery     Take these medicines the morning of surgery with A SIP OF WATER: amLODipine (NORVASC) atorvastatin (LIPITOR)  carvedilol (COREG) omeprazole (PRILOSEC)   If Needed: albuterol (VENTOLIN HFA) - inhaler bring with you day of surgery nitroGLYCERIN (NITROSTAT)   WHAT DO I DO ABOUT MY DIABETES MEDICATION?   Do not take oral diabetes medicines (pills) the morning of surgery.   THE MORNING OF SURGERY, take __50% of Lantus_insulin.  The day of surgery, do not take other diabetes injectables, including Byetta (exenatide), Bydureon (exenatide ER), Victoza (liraglutide), or Trulicity (dulaglutide).  If your CBG is greater than 220 mg/dL, you may take  of your sliding scale (correction) dose of insulin.   HOW TO MANAGE YOUR DIABETES BEFORE AND AFTER SURGERY  Why is it important to control my blood sugar before and after surgery? Improving blood sugar levels before and after surgery helps healing and can limit problems. A way of improving blood sugar control is eating a healthy diet by:  Eating less sugar and carbohydrates  Increasing activity/exercise  Talking with your doctor about reaching your blood sugar goals High blood sugars (greater than 180 mg/dL) can raise your risk of infections and slow your recovery, so you will need to focus on controlling your diabetes during the weeks before surgery. Make sure that the doctor who takes care of your diabetes knows about your planned surgery including the date and location.  How do I manage my blood sugar before  surgery? Check your blood sugar at least 4 times a day, starting 2 days before surgery, to make sure that the level is not too high or low.  Check your blood sugar the morning of your surgery when you wake up and every 2 hours until you get to the Short Stay unit.  If your blood sugar is less than 70 mg/dL, you will need to treat for low blood sugar: Do not take insulin. Treat a low blood sugar (less than 70 mg/dL) with  cup of clear juice (cranberry or apple), 4 glucose tablets, OR glucose gel. Recheck blood sugar in 15 minutes after treatment (to make sure it is greater than 70 mg/dL). If your blood sugar is not greater than 70 mg/dL on recheck, call [phone] for further instructions. Report your blood sugar to the short stay nurse when you get to Short Stay.  If you are admitted to the hospital after surgery: Your blood sugar will be checked by the staff and you will probably be given insulin after surgery (instead of oral diabetes medicines) to make sure you have good blood sugar levels. The goal for blood sugar control after surgery is 80-180 mg/dL.    After your COVID test   You are not required to quarantine however you are required to wear a well-fitting mask when you are out and around people not in your household.  If your mask becomes wet or soiled, replace with a new one.  Wash your hands often with soap and water for 20 seconds or clean your hands with an alcohol-based  hand sanitizer that contains at least 60% alcohol.  Do not share personal items.  Notify your provider: if you are in close contact with someone who has COVID  or if you develop a fever of 100.4 or greater, sneezing, cough, sore throat, shortness of breath or body aches.    As of today, STOP taking any Aleve, Naproxen, Ibuprofen, Motrin, Advil, Goody's, BC's, all herbal medications, fish oil, and all vitamins.          Do not wear jewelry or makeup Do not wear lotions, powders, perfumes or  deodorant. Do not shave 48 hours prior to surgery.   Do not bring valuables to the hospital. DO Not wear nail polish, gel polish, artificial nails, or any other type of covering on natural nails including finger and toenails. If patients have artificial nails, gel coating, etc. that need to be removed by a nail salon please have this removed prior to surgery or surgery may need to be canceled/delayed if the surgeon/ anesthesia feels like the patient is unable to be adequately monitored.             [facility] is not responsible for any belongings or valuables.  Do NOT Smoke (Tobacco/Vaping)  24 hours prior to your procedure If you use a CPAP at night, you may bring your mask for your overnight stay.   Contacts, glasses, dentures or bridgework may not be worn into surgery, please bring cases for these belongings   For patients admitted to the hospital, discharge time will be determined by your treatment team.   Patients discharged the day of surgery will not be allowed to drive home, and someone needs to stay with them for 24 hours.  NO VISITORS WILL BE ALLOWED IN PRE-OP WHERE PATIENTS ARE PREPPED FOR SURGERY.  ONLY 1 SUPPORT PERSON MAY BE PRESENT IN THE WAITING ROOM WHILE YOU ARE IN SURGERY.  IF YOU ARE TO BE ADMITTED, ONCE YOU ARE IN YOUR ROOM YOU WILL BE ALLOWED TWO (2) VISITORS. 1 (ONE) VISITOR MAY STAY OVERNIGHT BUT MUST ARRIVE TO THE ROOM BY 8pm.  Minor children may have two parents present. Special consideration for safety and communication needs will be reviewed on a case by case basis.  Special instructions:    Oral Hygiene is also important to reduce your risk of infection.  Remember - BRUSH YOUR TEETH THE MORNING OF SURGERY WITH YOUR REGULAR TOOTHPASTE   - Preparing For Surgery  Before surgery, you can play an important role. Because skin is not sterile, your skin needs to be as free of germs as possible. You can reduce the number of germs on your skin by washing with  CHG (chlorahexidine gluconate) Soap before surgery.  CHG is an antiseptic cleaner which kills germs and bonds with the skin to continue killing germs even after washing.     Please do not use if you have an allergy to CHG or antibacterial soaps. If your skin becomes reddened/irritated stop using the CHG.  Do not shave (including legs and underarms) for at least 48 hours prior to first CHG shower. It is OK to shave your face.  Please follow these instructions carefully.     Shower the NIGHT BEFORE SURGERY and the MORNING OF SURGERY with CHG Soap.   If you chose to wash your hair, wash your hair first as usual with your normal shampoo. After you shampoo, rinse your hair and body thoroughly to remove the shampoo.  Then [employer] and genitals (private  parts) with your normal soap and rinse thoroughly to remove soap.  After that Use CHG Soap as you would any other liquid soap. You can apply CHG directly to the skin and wash gently with a scrungie or a clean washcloth.   Apply the CHG Soap to your body ONLY FROM THE NECK DOWN.  Do not use on open wounds or open sores. Avoid contact with your eyes, ears, mouth and genitals (private parts). Wash Face and genitals (private parts)  with your normal soap.   Wash thoroughly, paying special attention to the area where your surgery will be performed.  Thoroughly rinse your body with warm water from the neck down.  DO NOT shower/wash with your normal soap after using and rinsing off the CHG Soap.  Pat yourself dry with a CLEAN TOWEL.  Wear CLEAN PAJAMAS to bed the night before surgery  Place CLEAN SHEETS on your bed the night before your surgery  DO NOT SLEEP WITH PETS.   Day of Surgery:  Take a shower with CHG soap. Wear Clean/Comfortable clothing the morning of surgery Do not apply any deodorants/lotions.   Remember to brush your teeth WITH YOUR REGULAR TOOTHPASTE.   Please read over the following fact sheets that you were given.

## 2021-09-17 ENCOUNTER — Encounter (HOSPITAL_COMMUNITY): Payer: Self-pay

## 2021-09-17 ENCOUNTER — Other Ambulatory Visit: Payer: Self-pay

## 2021-09-17 ENCOUNTER — Encounter (HOSPITAL_COMMUNITY)
Admission: RE | Admit: 2021-09-17 | Discharge: 2021-09-17 | Disposition: A | Discharge status: Home / Self Care | Payer: 59 | Source: Ambulatory Visit | Attending: Surgery | Admitting: Surgery

## 2021-09-17 ENCOUNTER — Ambulatory Visit (HOSPITAL_COMMUNITY)
Admission: RE | Admit: 2021-09-17 | Discharge: 2021-09-17 | Disposition: A | Discharge status: Home / Self Care | Payer: 59 | Source: Ambulatory Visit | Attending: Surgery | Admitting: Surgery

## 2021-09-17 DIAGNOSIS — Z01818 Encounter for other preprocedural examination: Secondary | ICD-10-CM | POA: Insufficient documentation

## 2021-09-17 DIAGNOSIS — I2510 Atherosclerotic heart disease of native coronary artery without angina pectoris: Secondary | ICD-10-CM | POA: Diagnosis not present

## 2021-09-17 DIAGNOSIS — Z20822 Contact with and (suspected) exposure to covid-19: Secondary | ICD-10-CM | POA: Insufficient documentation

## 2021-09-17 LAB — TYPE AND SCREEN
ABO/RH(D): AB POS
Antibody Screen: NEGATIVE

## 2021-09-17 LAB — URINALYSIS, ROUTINE W REFLEX MICROSCOPIC
Bacteria, UA: NONE SEEN
Bilirubin Urine: NEGATIVE
Glucose, UA: >=500 mg/dL — AB
Ketones, ur: NEGATIVE mg/dL
Leukocytes,Ua: NEGATIVE
Nitrite: NEGATIVE
Protein, ur: NEGATIVE mg/dL
Specific Gravity, Urine: 1.024 (ref 1.005–1.030)
pH: 5 (ref 5.0–8.0)

## 2021-09-17 LAB — COMPREHENSIVE METABOLIC PANEL
ALT: 17 U/L (ref 0–44)
AST: 21 U/L (ref 15–41)
Albumin: 3.6 g/dL (ref 3.5–5.0)
Alkaline Phosphatase: 61 U/L (ref 38–126)
Anion gap: 10 (ref 5–15)
BUN: 22 mg/dL — ABNORMAL HIGH (ref 6–20)
CO2: 18 mmol/L — ABNORMAL LOW (ref 22–32)
Calcium: 8.8 mg/dL — ABNORMAL LOW (ref 8.9–10.3)
Chloride: 105 mmol/L (ref 98–111)
Creatinine, Ser: 1.34 mg/dL — ABNORMAL HIGH (ref 0.44–1.00)
GFR, Estimated: 48 mL/min — ABNORMAL LOW (ref >60)
Glucose, Bld: 337 mg/dL — ABNORMAL HIGH (ref 70–99)
Potassium: 4.3 mmol/L (ref 3.5–5.1)
Sodium: 133 mmol/L — ABNORMAL LOW (ref 135–145)
Total Bilirubin: 1.2 mg/dL (ref 0.3–1.2)
Total Protein: 6.5 g/dL (ref 6.5–8.1)

## 2021-09-17 LAB — CBC
HCT: 30.4 % — ABNORMAL LOW (ref 36.0–46.0)
Hemoglobin: 9.8 g/dL — ABNORMAL LOW (ref 12.0–15.0)
MCH: 28.4 pg (ref 26.0–34.0)
MCHC: 32.2 g/dL (ref 30.0–36.0)
MCV: 88.1 fL (ref 80.0–100.0)
Platelets: 242 10*3/uL (ref 150–400)
RBC: 3.45 MIL/uL — ABNORMAL LOW (ref 3.87–5.11)
RDW: 13.5 % (ref 11.5–15.5)
WBC: 6.4 10*3/uL (ref 4.0–10.5)
nRBC: 0 % (ref 0.0–0.2)

## 2021-09-17 LAB — BLOOD GAS, ARTERIAL
Acid-base deficit: 5.6 mmol/L — ABNORMAL HIGH (ref 0.0–2.0)
Bicarbonate: 19 mmol/L — ABNORMAL LOW (ref 20.0–28.0)
Drawn by: 602861
FIO2: 21
O2 Saturation: 98 %
Patient temperature: 37
pCO2 arterial: 35.6 mmHg (ref 32.0–48.0)
pH, Arterial: 7.347 — ABNORMAL LOW (ref 7.350–7.450)
pO2, Arterial: 106 mmHg (ref 83.0–108.0)

## 2021-09-17 LAB — PROTIME-INR
INR: 1 (ref 0.8–1.2)
Prothrombin Time: 12.7 seconds (ref 11.4–15.2)

## 2021-09-17 LAB — HEMOGLOBIN A1C
Hgb A1c MFr Bld: 9 % — ABNORMAL HIGH (ref 4.8–5.6)
Mean Plasma Glucose: 211.6 mg/dL

## 2021-09-17 LAB — SURGICAL PCR SCREEN
MRSA, PCR: NEGATIVE
Staphylococcus aureus: POSITIVE — AB

## 2021-09-17 LAB — SARS CORONAVIRUS 2 (TAT 6-24 HRS): SARS Coronavirus 2: NEGATIVE

## 2021-09-17 LAB — APTT: aPTT: 26 seconds (ref 24–36)

## 2021-09-17 NOTE — Progress Notes (Addendum)
PCP: [name], MD Cardiologist: [name], MD  EKG: 09/17/21 CXR: 09/17/21 ECHO: 03/12/21 Stress Test: denies Cardiac Cath: 04/14/21  Fasting Blood Sugar- 110-210 Checks Blood Sugar_3-4__ times a day  OSA/CPAP: No  ASA: Continue Blood Thinner: No  Covid test 09/17/21  Anesthesia Review: Yes, cardiac history, A1C 9.0  Patient denies shortness of breath, fever, cough, and chest pain at PAT appointment.  Patient verbalized understanding of instructions provided today at the PAT appointment.  Patient asked to review instructions at home and day of surgery.

## 2021-09-18 ENCOUNTER — Encounter (HOSPITAL_COMMUNITY): Payer: Self-pay | Admitting: Certified Registered"

## 2021-09-18 ENCOUNTER — Encounter (HOSPITAL_COMMUNITY): Payer: Self-pay | Admitting: Emergency Medicine

## 2021-09-18 MED ORDER — TRANEXAMIC ACID 1000 MG/10ML IV SOLN
1.5000 mg/kg/h | INTRAVENOUS | Status: DC
  Filled 2021-09-18: qty 25

## 2021-09-18 MED ORDER — PHENYLEPHRINE HCL-NACL 20-0.9 MG/250ML-% IV SOLN
30.0000 ug/min | INTRAVENOUS | Status: DC
  Filled 2021-09-18: qty 250

## 2021-09-18 MED ORDER — PLASMA-LYTE A IV SOLN
INTRAVENOUS | Status: DC
  Filled 2021-09-18: qty 5

## 2021-09-18 MED ORDER — POTASSIUM CHLORIDE 2 MEQ/ML IV SOLN
80.0000 meq | INTRAVENOUS | Status: DC
Start: 2021-09-19 — End: 2021-09-18
  Filled 2021-09-18: qty 40

## 2021-09-18 MED ORDER — EPINEPHRINE HCL 5 MG/250ML IV SOLN IN NS
0.0000 ug/min | INTRAVENOUS | Status: DC
  Filled 2021-09-18: qty 250

## 2021-09-18 MED ORDER — NOREPINEPHRINE 4 MG/250ML-% IV SOLN
0.0000 ug/min | INTRAVENOUS | Status: DC
  Filled 2021-09-18: qty 250

## 2021-09-18 MED ORDER — VANCOMYCIN HCL 1250 MG/250ML IV SOLN
1250.0000 mg | INTRAVENOUS | Status: DC
  Filled 2021-09-18: qty 250

## 2021-09-18 MED ORDER — MAGNESIUM SULFATE 50 % IJ SOLN
40.0000 meq | INTRAMUSCULAR | Status: DC
  Filled 2021-09-18: qty 9.85

## 2021-09-18 MED ORDER — CEFAZOLIN SODIUM-DEXTROSE 2-4 GM/100ML-% IV SOLN
2.0000 g | INTRAVENOUS | Status: DC
Start: 2021-09-19 — End: 2021-09-18
  Filled 2021-09-18: qty 100

## 2021-09-18 MED ORDER — TRANEXAMIC ACID (OHS) PUMP PRIME SOLUTION
2.0000 mg/kg | INTRAVENOUS | Status: DC
  Filled 2021-09-18: qty 1.63

## 2021-09-18 MED ORDER — HEPARIN 30,000 UNITS/1000 ML (OHS) CELLSAVER SOLUTION
Status: DC
Start: 2021-09-19 — End: 2021-09-18
  Filled 2021-09-18: qty 1000

## 2021-09-18 MED ORDER — INSULIN REGULAR(HUMAN) IN NACL 100-0.9 UT/100ML-% IV SOLN
INTRAVENOUS | Status: DC
  Filled 2021-09-18: qty 100

## 2021-09-18 MED ORDER — TRANEXAMIC ACID (OHS) BOLUS VIA INFUSION
15.0000 mg/kg | INTRAVENOUS | Status: DC
  Filled 2021-09-18: qty 1086

## 2021-09-18 MED ORDER — NITROGLYCERIN IN D5W 200-5 MCG/ML-% IV SOLN
2.0000 ug/min | INTRAVENOUS | Status: DC
  Filled 2021-09-18: qty 250

## 2021-09-18 MED ORDER — DEXMEDETOMIDINE HCL IN NACL 400 MCG/100ML IV SOLN
0.1000 ug/kg/h | INTRAVENOUS | Status: DC
  Filled 2021-09-18: qty 100

## 2021-09-18 MED ORDER — MILRINONE LACTATE IN DEXTROSE 20-5 MG/100ML-% IV SOLN
0.3000 ug/kg/min | INTRAVENOUS | Status: DC
  Filled 2021-09-18: qty 100

## 2021-09-18 NOTE — Anesthesia Preprocedure Evaluation (Deleted)
Anesthesia Evaluation    Airway        Dental   Pulmonary shortness of breath and with exertion,  COPD inhaler,           Cardiovascular hypertension, Pt. on medications and Pt. on home beta blockers + CAD       Neuro/Psych  Headaches, Anxiety Depression    GI/Hepatic Neg liver ROS, GERD  Medicated,  Endo/Other  diabetes, Type 2, Insulin Dependent, Oral Hypoglycemic Agents  Renal/GU negative Renal ROS  negative genitourinary   Musculoskeletal negative musculoskeletal ROS (+)   Abdominal   Peds  Hematology negative hematology ROS (+)   Anesthesia Other Findings   Reproductive/Obstetrics                             Anesthesia Physical Anesthesia Plan  ASA: 4  Anesthesia Plan: General   Post-op Pain Management:    Induction: Intravenous  PONV Risk Score and Plan: 3 and Midazolam and Treatment may vary due to age or medical condition  Airway Management Planned: Mask and Oral ETT  Additional Equipment: Arterial line, CVP, PA Cath, 3D TEE, TEE and Ultrasound Guidance Line Placement  Intra-op Plan:   Post-operative Plan: Post-operative intubation/ventilation  Informed Consent:   Plan Discussed with:   Anesthesia Plan Comments: (Lab Results      Component                Value               Date                      WBC                      6.4                 09/17/2021                HGB                      9.8 (L)             09/17/2021                HCT                      30.4 (L)            09/17/2021                MCV                      88.1                09/17/2021                PLT                      242                 09/17/2021           Lab Results      Component                Value               Date  NA                       133 (L)             09/17/2021                K                        4.3                 09/17/2021                 CO2                      18 (L)              09/17/2021                GLUCOSE                  337 (H)             09/17/2021                BUN                      22 (H)              09/17/2021                CREATININE               1.34 (H)            09/17/2021                CALCIUM                  8.8 (L)             09/17/2021                EGFR                     56 (L)              08/04/2021                GFRNONAA                 48 (L)              09/17/2021           Cath 05/22: 1. Multivessel coronary artery disease including mild to moderate diffuse plaquing of the LAD and RCA.  The mid/distal LAD demonstrates 3 focal lesions of up to 80% that are hemodynamically significant by iFR (iFR = 0.71).  There is also a 99% stenosis of OM2 with faint left to left collaterals supplying the distal branch as well as sequential 60% distal RCA and 70-80% proximal RPDA lesions. 2. Upper normal to mildly elevated left heart filling pressure. 3. Normal right heart and pulmonary artery pressures. 4. Normal cardiac output/index.  ECHO 03/22: 1. Left ventricular ejection fraction, by estimation, is 35 to 40%. The  left ventricle has moderately decreased function. The left ventricle  demonstrates global hypokinesis. Left ventricular diastolic parameters are  consistent with Grade I diastolic  dysfunction (impaired relaxation). The average left ventricular global  longitudinal strain is -13.5 %. The  global longitudinal strain is  abnormal.  2. Right ventricular systolic function is normal. The right ventricular  size is normal.  3. Left atrial size was mildly dilated.  4. The mitral valve is normal in structure. Moderate mitral valve  regurgitation.  )        Anesthesia Quick Evaluation

## 2021-09-18 NOTE — H&P (Signed)
[address]       [place] [address]             [phone]      Cardiothoracic Surgery Admission History and Physical   The patient is a 50 year old woman with a history of hypertension, hyperlipidemia, and diabetes who developed left-sided chest discomfort as well as pain in her left shoulder and upper back in early March.  The symptoms lasted for several days and she has had no recurrence.  She denies any shortness of breath.  She has not noticed some decreased energy level and dizzy spells.  She denies orthopnea and peripheral edema.  She was seen by cardiology on 02/21/2021.  2D echocardiogram on 03/12/2021 showed moderate LV dysfunction with ejection fraction of 35 to 40% with global hypokinesis.  There was moderate mitral regurgitation.  She subsequently underwent cardiac catheterization on 04/14/2021 showing severe multivessel coronary disease.  The LAD had 70% proximal to mid stenosis, 60% mid stenosis, and 80% distal stenosis.  The left circumflex had a moderate sized second marginal branch that had 99% stenosis.  The right coronary artery was diffusely diseased with 60% distal stenosis.  The PDA had 75% proximal stenosis.  Right heart catheterization showed PA pressure of 26/16 with a wedge pressure of 15.  Right atrial pressure was 8.  Cardiac index was 2.8 and PA sat was 75%.  I saw her in the office on 04/23/2021 and schedule coronary bypass graft surgery which she has canceled twice.  She said that she is now prepared to undergo surgery.  She has occasional mild chest discomfort with exertion.  She has exertional fatigue and shortness of breath but has continued to work.    The patient is single and lives alone.  She is a non-smoker.  She works at [employer].        Past Medical History:  Diagnosis Date   Diabetes mellitus without complication ([place])     MRSA infection within last 3 months 02/25/2016   Osteomyelitis of foot ([place]) 08/26/2016               Past  Surgical History:  Procedure Laterality Date   CHOLECYSTECTOMY   1999   COLONOSCOPY WITH PROPOFOL N/A 03/25/2021    Procedure: COLONOSCOPY WITH PROPOFOL;  Surgeon: Jonathon Bellows, MD;  Location: [facility] ENDOSCOPY;  Service: Gastroenterology;  Laterality: N/A;   ESOPHAGOGASTRODUODENOSCOPY (EGD) WITH PROPOFOL N/A 03/25/2021    Procedure: ESOPHAGOGASTRODUODENOSCOPY (EGD) WITH PROPOFOL;  Surgeon: Jonathon Bellows, MD;  Location: [facility] ENDOSCOPY;  Service: Gastroenterology;  Laterality: N/A;   RIGHT/LEFT HEART CATH AND CORONARY ANGIOGRAPHY Bilateral 04/14/2021    Procedure: RIGHT/LEFT HEART CATH AND CORONARY ANGIOGRAPHY;  Surgeon: Nelva Bush, MD;  Location: [facility];  Service: Cardiovascular;  Laterality: Bilateral;   TOE SURGERY Left 02/07/2016    Pinky Toe               Family History  Problem Relation Age of Onset   Diabetes Mother     Ulcers Mother     Heart disease Father     AAA (abdominal aortic aneurysm) Father     Diabetes Father     Hypertension Father     Stroke Father     Alzheimer's disease Father     Heart attack Sister     Seizures Brother     Diabetes Maternal Grandmother     Breast cancer Maternal Grandmother        Social  History Social History             Tobacco Use   Smoking status: Never Smoker   Smokeless tobacco: Never Used  Vaping Use   Vaping Use: Never used  Substance Use Topics   Alcohol use: No      Alcohol/week: 0.0 standard drinks   Drug use: No           Current Outpatient Medications  Medication Sig Dispense Refill   albuterol (VENTOLIN HFA) 108 (90 Base) MCG/ACT inhaler INHALE 2 PUFFS INTO THE LUNGS EVERY 6 HOURS AS NEEDED FOR WHEEZING OR SHORTNESS OF BREATH 6.7 g 5   amLODipine (NORVASC) 5 MG tablet Take 1 tablet (5 mg total) by mouth daily. 90 tablet 0   aspirin EC 81 MG tablet Take 1 tablet (81 mg total) by mouth daily. Swallow whole.       atorvastatin (LIPITOR) 80 MG tablet Take 80 mg by mouth daily.       blood glucose meter  kit and supplies Dispense based on patient and insurance preference. Use up to four times daily as directed. (FOR ICD-10 E10.9, E11.9). 1 each 0   carvedilol (COREG) 12.5 MG tablet Take 1 tablet (12.5 mg total) by mouth 2 (two) times daily. 60 tablet 2   furosemide (LASIX) 40 MG tablet Take 1 tablet (40 mg total) by mouth daily. 30 tablet 3   insulin glargine (LANTUS) 100 UNIT/ML injection Inject 0.2-0.5 mLs (20-50 Units total) into the skin daily. In the morning 15 mL 0   Multiple Vitamins-Minerals (MULTIVITAMIN WITH MINERALS) tablet Take 1 tablet by mouth daily. 100 tablet 0   nitroGLYCERIN (NITROSTAT) 0.4 MG SL tablet Place 1 tablet (0.4 mg total) under the tongue every 5 (five) minutes as needed for chest pain. 25 tablet prn   omeprazole (PRILOSEC) 40 MG capsule Take 1 capsule (40 mg total) by mouth daily. 90 capsule 3   sacubitril-valsartan (ENTRESTO) 24-26 MG Take 1 tablet by mouth 2 (two) times daily. 60 tablet 1   tirzepatide (MOUNJARO) 5 MG/0.5ML Pen Inject 5 mg into the skin once a week. Titrating dose every month until follow up 2 mL 0   spironolactone (ALDACTONE) 25 MG tablet Take 0.5 tablets (12.5 mg total) by mouth daily. 15 tablet 5    No current facility-administered medications for this visit.     No Known Allergies   Review of Systems  Constitutional: Positive for fatigue. Negative for activity change.  HENT: Negative.   Eyes: Negative.   Respiratory: Negative for shortness of breath.   Cardiovascular: Positive for chest pain. Negative for leg swelling.  Gastrointestinal: Negative.   Endocrine: Negative.   Genitourinary: Negative.   Musculoskeletal: Negative.   Allergic/Immunologic: Negative.   Neurological: Positive for dizziness. Negative for syncope.  Hematological: Negative.   Psychiatric/Behavioral: Negative     Physical Exam: BP 104/66 (BP Location: Left Arm, Patient Position: Sitting)   Pulse 88   Resp 20   Ht 5' 9"  (1.753 m)   Wt 183 lb (83 kg)   SpO2  96% Comment: RA  BMI 27.02 kg/m  She looks well. Cardiac exam shows regular rate and rhythm with normal heart sounds.  There is no murmur. Lungs are clear. There is mild lower extremity edema.   Diagnostic Tests:   Physicians  Panel Physicians Referring Physician Case Authorizing Physician  End, Harrell Gave, MD (Primary)     Procedures  RIGHT/LEFT HEART CATH AND CORONARY ANGIOGRAPHY   Conclusion  Conclusions: Multivessel coronary artery  disease including mild to moderate diffuse plaquing of the LAD and RCA.  The mid/distal LAD demonstrates 3 focal lesions of up to 80% that are hemodynamically significant by iFR (iFR = 0.71).  There is also a 99% stenosis of OM2 with faint left to left collaterals supplying the distal branch as well as sequential 60% distal RCA and 70-80% proximal RPDA lesions. Upper normal to mildly elevated left heart filling pressure. Normal right heart and pulmonary artery pressures. Normal cardiac output/index.   Recommendations: In the setting of multivessel coronary artery disease, diabetes mellitus, and reduced LVEF that appears to be due to ischemic cardiomyopathy, consultation with cardiac surgery for consideration of CABG is recommended. Initiate aspirin 81 mg daily. Increase carvedilol to 12.5 mg twice daily with further escalation of goal-directed medical therapy for chronic HFrEF as tolerated. Aggressive secondary prevention of coronary artery disease.   Nelva Bush, MD [facility]   Recommendations  Antiplatelet/Anticoag Aspirin 81 mg daily pending cardiac surgery consultation.  Discharge Date In the absence of any other complications or medical issues, we expect the patient to be ready for discharge from a cath perspective on 04/14/2021.   Surgeon Notes    04/17/2021 11:14 AM GI Operative Note revision by Jonathon Bellows, MD    04/14/2021 11:40 AM Brief Op Note signed by Nelva Bush, MD    03/25/2021 10:39 AM GI Operative Note signed by  Jonathon Bellows, MD    03/25/2021 10:27 AM GI Operative Note signed by Jonathon Bellows, MD   Indications  Heart failure with reduced ejection fraction (HCC) [I50.20 (ICD-10-CM)]  Atypical chest pain [R07.89 (ICD-10-CM)]   Procedural Details  Technical Details Indication: 50 y.o. year-old woman with history of hypertension, hyperlipidemia, and uncontrolled diabetes mellitus, presenting for evaluation of atypical chest/shoulder pain and shortness of breath as well as recently diagnosed cardiomyopathy with LVEF of 35-40% by echo.  GFR: 48 ml/min  Procedure: The risks, benefits, complications, treatment options, and expected outcomes were discussed with the patient. The patient and/or family concurred with the proposed plan, giving informed consent. The patient was sedated with IV midazolam and fentanyl. The right wrist and elbow were prepped and draped in a sterile fashion. 1% lidocaine was used for local anesthesia. Ultrasound was used to evaluate the right basilic vein. It was patent.  A micropuncture needle was used to access the right basilic vein under ultrasound guidance. A 34F slender Glidesheath was inserted using modified Seldinger technique. Right heart catheterization was performed by advancing a 34F balloon-tipped catheter through the right heart chambers into the pulmonary capillary wedge position. Pressure measurements and oxygen saturations were obtained.  Thermodilution calculations were also performed.  Ultrasound was used to evaluate the right radial artery. It was patent.  A micropuncture needle was used to access the right radial artery under ultrasound guidance. Using the modified Seldinger access technique, a 14F slender Glidesheath was placed in the right radial artery. 3 mg Verapamil was given through the sheath. Heparin 4,500 units were administered.  Selective coronary angiography was performed using a 14F TIG4.0 catheter to engage the left coronary artery and a 14F TIG4.0 catheter to engage  the right coronary artery. Left heart catheterization was performed using a 14F angled pigtail catheter. Left ventriculogram was not performed.  iFR of LAD: Heparin was used for anticoagulation.  The left coronary artery was engaged with a 14F EBU 3.5 guide catheter and an Omni pressure wire advanced into the distal LAD after administration of intracoronary nitroglycerin.  iFR was measured (iFR 0.71)  followed by iFR pullback from distal through ostial LAD.  iFR pullback showed step up in readings at all 3 mid/distal LAD lesions.  Final angiogram shows stable appearance of the LAD.  At the end of the procedure, the radial artery sheath was removed and a TR band applied to achieve patent hemostasis.  The basilic vein sheath was removed and hemostasis achieved with manual compression.  There were no immediate complications. The patient was taken to the recovery area in stable condition.  Estimated blood loss <50 mL.   During this procedure medications were administered to achieve and maintain moderate conscious sedation while the patient's heart rate, blood pressure, and oxygen saturation were continuously monitored and I was present face-to-face 100% of this time.   Medications (Filter: Administrations occurring from 1010 to 1136 on 04/14/21) Heparin (Porcine) in NaCl 1000-0.9 UT/500ML-% SOLN (mL) Total volume:  500 mL Date/Time Rate/Dose/Volume Action   04/14/21 1024 500 mL Given    midazolam (VERSED) injection (mg) Total dose:  1 mg Date/Time Rate/Dose/Volume Action   04/14/21 1029 1 mg Given    fentaNYL (SUBLIMAZE) injection (mcg) Total dose:  25 mcg Date/Time Rate/Dose/Volume Action   04/14/21 1029 25 mcg Given    heparin sodium (porcine) injection (Units) Total dose:  9,000 Units Date/Time Rate/Dose/Volume Action   04/14/21 1050 4,500 Units Given   1109 4,500 Units Given    verapamil (ISOPTIN) injection (mg) Total dose:  2.5 mg Date/Time Rate/Dose/Volume Action   04/14/21 1048  2.5 mg Given    iohexol (OMNIPAQUE) 300 MG/ML solution (mL) Total volume:  54 mL Date/Time Rate/Dose/Volume Action   04/14/21 1128 54 mL Given    Sedation Time  Sedation Time Physician-1: 55 minutes 31 seconds Contrast  Medication Name Total Dose  iohexol (OMNIPAQUE) 300 MG/ML solution 54 mL   Radiation/Fluoro  Fluoro time: 7.5 (min) DAP: 20.4 (Gycm2) Cumulative Air Kerma: 655 (mGy) Complications  Complications documented before study signed (04/14/2021  3:74 PM)   No complications were associated with this study.  Documented by Nelva Bush, MD - 04/14/2021  8:10 PM     Coronary Findings  Diagnostic Dominance: Right Left Main  Vessel is large. Vessel is angiographically normal.  Left Anterior Descending  Vessel is moderate in size. There is moderate diffuse disease throughout the vessel.  Prox LAD to Mid LAD lesion is 70% stenosed.  Mid LAD lesion is 60% stenosed.  Dist LAD lesion is 80% stenosed.  First Diagonal Branch  Vessel is small in size.  Second Diagonal Branch  Vessel is small in size.  Left Circumflex  Vessel is large.  Mid Cx lesion is 30% stenosed.  First Obtuse Marginal Branch  Vessel is large in size.  1st Mrg lesion is 40% stenosed.  Second Obtuse Marginal Branch  Vessel is moderate in size.  Collaterals  2nd Mrg filled by collaterals from 1st Mrg.    2nd Mrg lesion is 99% stenosed.  Right Coronary Artery  Vessel is moderate in size. There is mild diffuse disease throughout the vessel.  Dist RCA lesion is 60% stenosed.  Right Posterior Descending Artery  Vessel is moderate in size.  RPDA lesion is 75% stenosed.  Right Posterior Atrioventricular Artery  Vessel is small in size. There is moderate disease in the vessel.  First Right Posterolateral Branch  Vessel is small in size. There is moderate disease in the vessel.  Second Right Posterolateral Branch  Vessel is small in size. There is moderate disease in the vessel.   Intervention  No interventions have been documented. Right Heart  Right Heart Pressures RA (mean): 8 mmHg RV (S/EDP): 30/10 mmHg PA (S/D, mean): 26/16 (21) mmHg PCWP (mean): 15 mmHg  Ao sat: 99% PA sat: 75%  Fick CO: 5.7 L/min Fick CI: 2.8 L/min/m^2  Thermodilution CO: 6.4 L/min Thermodilution CI: 3.2 L/min/m^2   Left Heart  Left Ventricle LV end diastolic pressure is mildly elevated. LVEDP 17 mmHg.  Aortic Valve There is no aortic valve stenosis.   Coronary Diagrams  Diagnostic Dominance: Right Intervention  Implants     No implant documentation for this case.   Syngo Images   Show images for CARDIAC CATHETERIZATION Images on Long Term Storage   Show images for [place], [place] to Procedure Log  Procedure Log    Hemo Data (last day) before discharge  AO Systolic Cath Pressure AO Diastolic Cath Pressure AO Mean Cath Pressure LV Systolic Cath Pressure LV End Diastolic LV Systolic LV End Diastolic LV dP/dt PA Systolic Cath Pressure PA Diastolic Cath Pressure PA Mean Cath Pressure RA Wedge A Wave RA Wedge V Wave RV Systolic Cath Pressure RV Diastolic Cath Pressure RV End Diastolic RV Systolic RV End Diastolic RV dP/dt PCW A Wave PCW V Wave PCW Mean AO O2 Sat PA O2 Sat AO O2 Sat Fick C.O. Fick C.I. TDCO TDCI  -- -- -- -- -- 155 mmHg 17 mmHg 1248 mmHg/sec -- -- -- 10 mmHg 7 mmHg -- -- -- 30 mmHg 10 mmHg 240 mmHg/sec 15 mmHg 15 mmHg 12 mmHg -- -- -- 5.7 L/min 2.82 L/min/m2 6.37 L/min 3.15 L/min/m2  -- -- -- -- -- -- -- -- -- -- -- -- -- 28 mmHg 6 mmHg 8 mmHg -- -- -- -- -- -- -- -- -- -- -- -- --  -- -- -- -- -- -- -- -- 24 mmHg 13 mmHg 18 mmHg -- -- -- -- -- -- -- -- -- -- -- -- -- -- -- -- -- --  -- -- -- -- -- -- -- -- -- -- -- -- -- -- -- -- -- -- -- -- -- -- -- -- -- -- -- 6.37 L/min --  153 77 mmHg 108 mmHg -- -- -- -- -- -- -- -- -- -- -- -- -- -- -- -- -- -- -- -- -- -- -- -- -- --  -- -- -- -- -- -- -- -- -- -- -- -- -- -- -- -- -- -- -- -- -- -- -- MV -- --  -- -- --  -- -- -- -- -- -- -- -- -- -- -- -- -- -- -- -- -- -- -- -- -- -- 98.7 % -- SA -- -- -- --  -- -- -- 163 mmHg 20 mmHg -- -- -- -- -- -- -- -- -- -- -- -- -- -- -- -- -- -- -- -- -- -- -- --  -- -- -- 161 mmHg 20 mmHg -- -- -- -- -- -- -- -- -- -- -- -- -- -- -- -- -- -- -- -- -- -- -- --  -- -- -- 155 mmHg 17 mmHg -- -- -- -- -- -- -- -- -- -- -- -- -- -- -- -- -- -- -- -- -- -- -- --  163 80 mmHg 114 mmHg -- -- -- -- -- -- -- -- -- -- -- -- -- -- -- -- -- -- -- -- -- -- -- -- -- --  -- -- -- -- -- -- -- --  26 mmHg 16 mmHg 21 mmHg -- -- -- -- -- -- -- -- -- -- -- -- -- -- -- -- -- --  -- -- -- -- -- -- -- -- -- -- -- -- -- 30 mmHg 7 mmHg 10 mmHg -- -- -- -- -- -- -- -- -- -- -- -- --  147 79 mmHg 109 mmHg -- -- -- --                           Impression:   This 50 year old diabetic woman has severe three-vessel coronary disease with moderate left ventricular systolic dysfunction and an ejection fraction of 35 to 40% with grade 1 diastolic dysfunction.  She has moderate mitral valve regurgitation by echocardiogram in March 2022.  It is felt that coronary artery bypass graft surgery is the best treatment prevent further ischemia and infarction.  Her upper extremity arterial Dopplers showed no change in the left palmar signal with radial compression and therefore I think we can use a left internal mammary graft to the LAD, a left radial graft to a 99% stenosed obtuse marginal branch, and a sequential vein graft to the PDA and PL on the right coronary artery. I discussed the operative procedure with the patient including alternatives, benefits and risks; including but not limited to bleeding, blood transfusion, infection, stroke, myocardial infarction, graft failure, heart block requiring a permanent pacemaker, organ dysfunction, and death.  [name] understands and agrees to proceed.     Plan:   Coronary bypass graft surgery with a left radial artery graft.     Gaye Pollack,  MD [facility] [phone]

## 2021-09-19 ENCOUNTER — Other Ambulatory Visit: Payer: Self-pay | Admitting: *Deleted

## 2021-09-19 ENCOUNTER — Other Ambulatory Visit: Payer: Self-pay

## 2021-09-19 ENCOUNTER — Inpatient Hospital Stay (HOSPITAL_COMMUNITY): Admission: RE | Admit: 2021-09-19 | Payer: 59 | Source: Home / Self Care | Admitting: Surgery

## 2021-09-19 ENCOUNTER — Encounter (HOSPITAL_COMMUNITY): Admission: RE | Payer: Self-pay | Source: Home / Self Care

## 2021-09-19 SURGERY — CORONARY ARTERY BYPASS GRAFTING (CABG)
Anesthesia: General | Site: Chest

## 2021-09-19 MED ORDER — TRULANCE 3 MG PO TABS: 1.0000 | ORAL_TABLET | ORAL | 3 refills | Status: AC

## 2021-09-26 ENCOUNTER — Other Ambulatory Visit: Payer: Self-pay | Admitting: Family Medicine

## 2021-09-26 DIAGNOSIS — E1129 Type 2 diabetes mellitus with other diabetic kidney complication: Secondary | ICD-10-CM

## 2021-09-26 DIAGNOSIS — R809 Proteinuria, unspecified: Secondary | ICD-10-CM

## 2021-09-30 NOTE — Telephone Encounter (Signed)
Mail box full . Pt needs a followup sooner that her scheduled appt she has.

## 2021-10-03 ENCOUNTER — Encounter: Payer: Self-pay | Admitting: Family Medicine

## 2021-10-03 ENCOUNTER — Other Ambulatory Visit: Payer: Self-pay

## 2021-10-03 ENCOUNTER — Ambulatory Visit (INDEPENDENT_AMBULATORY_CARE_PROVIDER_SITE_OTHER): Payer: 59 | Admitting: Family Medicine

## 2021-10-03 VITALS — BP 124/70 | HR 86 | Temp 99.4°F | Resp 16 | Ht 69.0 in | Wt 178.0 lb

## 2021-10-03 DIAGNOSIS — I209 Angina pectoris, unspecified: Secondary | ICD-10-CM

## 2021-10-03 DIAGNOSIS — E1159 Type 2 diabetes mellitus with other circulatory complications: Secondary | ICD-10-CM | POA: Diagnosis not present

## 2021-10-03 DIAGNOSIS — E1129 Type 2 diabetes mellitus with other diabetic kidney complication: Secondary | ICD-10-CM

## 2021-10-03 DIAGNOSIS — E113491 Type 2 diabetes mellitus with severe nonproliferative diabetic retinopathy without macular edema, right eye: Secondary | ICD-10-CM

## 2021-10-03 DIAGNOSIS — F325 Major depressive disorder, single episode, in full remission: Secondary | ICD-10-CM | POA: Diagnosis not present

## 2021-10-03 DIAGNOSIS — I2510 Atherosclerotic heart disease of native coronary artery without angina pectoris: Secondary | ICD-10-CM | POA: Diagnosis not present

## 2021-10-03 DIAGNOSIS — I2583 Coronary atherosclerosis due to lipid rich plaque: Secondary | ICD-10-CM

## 2021-10-03 DIAGNOSIS — J069 Acute upper respiratory infection, unspecified: Secondary | ICD-10-CM

## 2021-10-03 DIAGNOSIS — I152 Hypertension secondary to endocrine disorders: Secondary | ICD-10-CM

## 2021-10-03 DIAGNOSIS — J45901 Unspecified asthma with (acute) exacerbation: Secondary | ICD-10-CM

## 2021-10-03 DIAGNOSIS — R809 Proteinuria, unspecified: Secondary | ICD-10-CM

## 2021-10-03 MED ORDER — TIRZEPATIDE 7.5 MG/0.5ML ~~LOC~~ SOAJ: 7.5000 mg | SUBCUTANEOUS | 0 refills | Status: DC

## 2021-10-03 MED ORDER — TRELEGY ELLIPTA 100-62.5-25 MCG/ACT IN AEPB: 1.0000 | INHALATION_SPRAY | Freq: Every day | RESPIRATORY_TRACT | 11 refills | Status: DC

## 2021-10-03 MED ORDER — BENZONATATE 100 MG PO CAPS: 100.0000 mg | ORAL_CAPSULE | Freq: Three times a day (TID) | ORAL | 0 refills | Status: DC | PRN

## 2021-10-03 NOTE — Progress Notes (Signed)
Name: Sarah Duffy   MRN: [id number]    DOB: [birth year]   Date:10/03/2021       Progress Note  Subjective  Chief Complaint  Medication Management  HPI  DMII: history of left foot ulcer and osteomyelitis, microalbuminuria, hypertension, retinopathy and dyslipidemia. Ulcer finally resolved.  She is currently on Lantus 30 units, Jardiance  and Mounjaro. Glucose has been better controlled, she has been out of [place] for the past week, we will send a new rx with the new dose and do another PA. Recently had another injection on right eye due to increase in blurred vision.   URI: she states her symptoms started on Monday with left ear fullness, some vertigo, two days ago developed sore throat followed by postnasal drainage and cough, now is productive but unable to cough it up . No fever or chills. She has been wheezing and is using inhaler prn. She does not smoke , but needs albuterol prn seasonally , RAD  MDD: she had a gap in her insurance coverage and has been without medications for months. She lost her mother unexpectedly on Feb 4 th , 20.  She has not been going to work since, she is feeling overwhelmed. She states when her mother died  her sister and sister's boyfriend moved in and has been stressing her out. She was taking duloxetine and Abilify but stopped on her own, but states she is doing fine at this time She has not been able to find a psychiatrist that accepts her insurance yet.   HTN: bp is at goal, under the care of cardiologist, had labs done 09/17/2021 for pre-op ( but surgery was cancelled) GFR was 48, discussed importance of avoiding NSAID's.   CAD: under the care of Dr. Mylo Red, she went for second opinion at [facility] and also recommended CABG , she will has a follow up with cardiovascular surgeon Dr. Cyndia Bent in [place] to discuss , she was ready to have CABG on 09/19/2021 but it was cancelled by the physician due to personal problems. She denies any recent chest pain, taking  medication daily . Reminded her again to continue getting A1C lower to decrease risk of complications on the peri operative period.   Conclusions Cath :   Multivessel coronary artery disease including mild to moderate diffuse plaquing of the LAD and RCA.  The mid/distal LAD demonstrates 3 focal lesions of up to 80% that are hemodynamically significant by iFR (iFR = 0.71).  There is also a 99% stenosis of OM2 with faint left to left collaterals supplying the distal branch as well as sequential 60% distal RCA and 70-80% proximal RPDA lesions.  Patient Active Problem List   Diagnosis Date Noted   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 edema associated with type 2 diabetes mellitus ([place]) 05/01/2021   Cardiomyopathy ([place]) 04/14/2021   Osteomyelitis of foot, left, acute ([place]) 06/04/2020   Depression, major, recurrent, mild ([place]) 09/25/2019   History of 2019 novel coronavirus disease (COVID-19) 06/23/2019   Elevated blood pressure reading without diagnosis of hypertension 03/03/2018   Migraine headache without aura 01/14/2018   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([place]) 05/22/2017   Diabetic retinopathy of left eye, with macular edema, with severe nonproliferative retinopathy, associated with type 2 diabetes mellitus 05/21/2016   Severe nonproliferative diabetic retinopathy of right eye without macular edema associated with type 2 diabetes mellitus ([place]) 05/21/2016   Hyperlipidemia 03/10/2016   MI (mitral incompetence) 02/25/2016   Insomnia 02/25/2016   Uncontrolled type 2 diabetes  mellitus with foot ulcer 02/03/2016   Anxiety 09/04/2014    Past Surgical History:  Procedure Laterality Date   CHOLECYSTECTOMY  1999   COLONOSCOPY WITH PROPOFOL N/A 03/25/2021   Procedure: COLONOSCOPY WITH PROPOFOL;  Surgeon: Jonathon Bellows, MD;  Location: [facility] ENDOSCOPY;  Service: Gastroenterology;  Laterality: N/A;   COLONOSCOPY WITH PROPOFOL N/A 04/17/2021   Procedure: COLONOSCOPY WITH  PROPOFOL;  Surgeon: Jonathon Bellows, MD;  Location: [facility] ENDOSCOPY;  Service: Gastroenterology;  Laterality: N/A;   ESOPHAGOGASTRODUODENOSCOPY (EGD) WITH PROPOFOL N/A 03/25/2021   Procedure: ESOPHAGOGASTRODUODENOSCOPY (EGD) WITH PROPOFOL;  Surgeon: Jonathon Bellows, MD;  Location: [facility] ENDOSCOPY;  Service: Gastroenterology;  Laterality: N/A;   ESOPHAGOGASTRODUODENOSCOPY (EGD) WITH PROPOFOL N/A 04/17/2021   Procedure: ESOPHAGOGASTRODUODENOSCOPY (EGD) WITH PROPOFOL;  Surgeon: Jonathon Bellows, MD;  Location: [facility] ENDOSCOPY;  Service: Gastroenterology;  Laterality: N/A;   RIGHT/LEFT HEART CATH AND CORONARY ANGIOGRAPHY Bilateral 04/14/2021   Procedure: RIGHT/LEFT HEART CATH AND CORONARY ANGIOGRAPHY;  Surgeon: Nelva Bush, MD;  Location: [place] CV LAB;  Service: Cardiovascular;  Laterality: Bilateral;   TOE SURGERY Left 02/07/2016   Pinky Toe    Family History  Problem Relation Age of Onset   Diabetes Mother    Ulcers Mother    Heart disease Father    AAA (abdominal aortic aneurysm) Father    Diabetes Father    Hypertension Father    Stroke Father    Alzheimer's disease Father    Heart attack Sister    Seizures Brother    Diabetes Maternal Grandmother    Breast cancer Maternal Grandmother     Social History   Tobacco Use   Smoking status: Never   Smokeless tobacco: Never  Substance Use Topics   Alcohol use: No    Alcohol/week: 0.0 standard drinks     Current Outpatient Medications:    albuterol (VENTOLIN HFA) 108 (90 Base) MCG/ACT inhaler, INHALE 2 PUFFS INTO THE LUNGS EVERY 6 HOURS AS NEEDED FOR WHEEZING OR SHORTNESS OF BREATH, Disp: 6.7 g, Rfl: 5   amLODipine (NORVASC) 5 MG tablet, Take 1 tablet (5 mg total) by mouth daily. (Patient taking differently: Take 5 mg by mouth at bedtime.), Disp: 90 tablet, Rfl: 0   aspirin EC 81 MG tablet, Take 1 tablet (81 mg total) by mouth daily. Swallow whole. (Patient taking differently: Take 81 mg by mouth at bedtime. Swallow whole.), Disp: , Rfl:     atorvastatin (LIPITOR) 80 MG tablet, Take 80 mg by mouth daily., Disp: , Rfl:    blood glucose meter kit and supplies, Dispense based on patient and insurance preference. Use up to four times daily as directed. (FOR ICD-10 E10.9, E11.9)., Disp: 1 each, Rfl: 0   carvedilol (COREG) 12.5 MG tablet, Take 1 tablet (12.5 mg total) by mouth 2 (two) times daily., Disp: 60 tablet, Rfl: 2   furosemide (LASIX) 40 MG tablet, Take 1 tablet (40 mg total) by mouth daily., Disp: 30 tablet, Rfl: 3   insulin glargine (LANTUS SOLOSTAR) 100 UNIT/ML Solostar Pen, Inject 18-24 Units into the skin See admin instructions. Inject 18 units in the AM and 24 units at bedtime., Disp: 15 mL, Rfl: 0   JARDIANCE 25 MG TABS tablet, Take 25 mg by mouth daily., Disp: , Rfl:    Multiple Vitamins-Minerals (MULTIVITAMIN WITH MINERALS) tablet, Take 1 tablet by mouth daily., Disp: 100 tablet, Rfl: 0   nitroGLYCERIN (NITROSTAT) 0.4 MG SL tablet, Place 1 tablet (0.4 mg total) under the tongue every 5 (five) minutes as needed for chest pain., Disp:  25 tablet, Rfl: prn   omeprazole (PRILOSEC) 40 MG capsule, Take 1 capsule (40 mg total) by mouth daily., Disp: 90 capsule, Rfl: 3   sacubitril-valsartan (ENTRESTO) 24-26 MG, Take 1 tablet by mouth 2 (two) times daily., Disp: 60 tablet, Rfl: 1   tirzepatide (MOUNJARO) 5 MG/0.5ML Pen, Inject 5 mg into the skin once a week. Titrating dose every month until follow up, Disp: 2 mL, Rfl: 0   spironolactone (ALDACTONE) 25 MG tablet, Take 0.5 tablets (12.5 mg total) by mouth daily. (Patient taking differently: No sig reported), Disp: 15 tablet, Rfl: 5  No Known Allergies  I personally reviewed active problem list, medication list, allergies, family history, social history, health maintenance with the patient/caregiver today.   ROS  Constitutional: Negative for fever or weight change.  Respiratory:positive for cough but no  shortness of breath.   Cardiovascular: Negative for chest pain or palpitations.   Gastrointestinal: Negative for abdominal pain, no bowel changes.  Musculoskeletal: Negative for gait problem or joint swelling.  Skin: Negative for rash.  Neurological: Negative for dizziness or headache.  No other specific complaints in a complete review of systems (except as listed in HPI above).   Objective  Vitals:   10/03/21 0929  BP: 124/70  Pulse: 86  Resp: 16  Temp: 99.4 F (37.4 C)  SpO2: 100%  Weight: 178 lb (80.7 kg)  Height: _0  (1.753 m)    Body mass index is 26.29 kg/m.  Physical Exam  Constitutional: Patient appears well-developed and well-nourished. Overweight.  No distress.  HEENT: head atraumatic, normocephalic, pupils equal and reactive to light, ears not done ,neck supple, throat within normal limits Cardiovascular: Normal rate, regular rhythm and normal heart sounds.  2 plus murmur heard. No BLE edema. Pulmonary/Chest: Effort normal and breath sounds normal. No respiratory distress. Abdominal: Soft.  There is no tenderness. Psychiatric: Patient has a normal mood and affect. behavior is normal. Judgment and thought content normal.   Recent Results (from the past 2160 hour(s))  POCT HgB A1C     Status: Abnormal   Collection Time: 08/04/21  8:00 AM  Result Value Ref Range   Hemoglobin A1C 9.4 (A) 4.0 - 5.6 %   HbA1c POC (<> result, manual entry)     HbA1c, POC (prediabetic range)     HbA1c, POC (controlled diabetic range)    Lipid panel     Status: Abnormal   Collection Time: 08/04/21  8:42 AM  Result Value Ref Range   Cholesterol 168 <200 mg/dL   HDL 34 (L) > OR = 50 mg/dL   Triglycerides 89 <150 mg/dL   LDL Cholesterol (Calc) 115 (H) mg/dL (calc)    Comment: Reference range: <100 . Desirable range <100 mg/dL for primary prevention;   <70 mg/dL for patients with CHD or diabetic patients  with > or = 2 CHD risk factors. Marland Kitchen LDL-C is now calculated using the Martin-Hopkins  calculation, which is a validated novel method providing  better  accuracy than the Friedewald equation in the  estimation of LDL-C.  Cresenciano Genre et al. Annamaria Helling. 1657;903(83): 2061-2068  (http://education.Quest[employer].com/faq/FAQ164)    Total CHOL/HDL Ratio 4.9 <5.0 (calc)   Non-HDL Cholesterol (Calc) 134 (H) <130 mg/dL (calc)    Comment: For patients with diabetes plus 1 major ASCVD risk  factor, treating to a non-HDL-C goal of <100 mg/dL  (LDL-C of <70 mg/dL) is considered a therapeutic  option.   COMPLETE METABOLIC PANEL WITH GFR     Status: Abnormal  Collection Time: 08/04/21  8:42 AM  Result Value Ref Range   Glucose, Bld 248 (H) 65 - 99 mg/dL    Comment: .            Fasting reference interval . For someone without known diabetes, a glucose value >125 mg/dL indicates that they may have diabetes and this should be confirmed with a follow-up test. .    BUN 28 (H) 7 - 25 mg/dL   Creat 1.17 (H) 0.50 - 1.03 mg/dL   eGFR 56 (L) > OR = 60 mL/min/1.64m    Comment: The eGFR is based on the CKD-EPI 2021 equation. To calculate  the new eGFR from a previous Creatinine or Cystatin C result, go to https://www.kidney.org/professionals/ kdoqi/gfr%5Fcalculator    BUN/Creatinine Ratio 24 (H) 6 - 22 (calc)   Sodium 139 135 - 146 mmol/L   Potassium 4.5 3.5 - 5.3 mmol/L   Chloride 107 98 - 110 mmol/L   CO2 25 20 - 32 mmol/L   Calcium 9.4 8.6 - 10.4 mg/dL   Total Protein 7.0 6.1 - 8.1 g/dL   Albumin 4.1 3.6 - 5.1 g/dL   Globulin 2.9 1.9 - 3.7 g/dL (calc)   AG Ratio 1.4 1.0 - 2.5 (calc)   Total Bilirubin 0.9 0.2 - 1.2 mg/dL   Alkaline phosphatase (APISO) 65 37 - 153 U/L   AST 17 10 - 35 U/L   ALT 15 6 - 29 U/L  CBC with Differential/Platelet     Status: Abnormal   Collection Time: 08/04/21  8:42 AM  Result Value Ref Range   WBC 7.5 3.8 - 10.8 Thousand/uL   RBC 3.61 (L) 3.80 - 5.10 Million/uL   Hemoglobin 10.4 (L) 11.7 - 15.5 g/dL   HCT 31.7 (L) 35.0 - 45.0 %   MCV 87.8 80.0 - 100.0 fL   MCH 28.8 27.0 - 33.0 pg   MCHC 32.8 32.0 - 36.0 g/dL    RDW 12.7 11.0 - 15.0 %   Platelets 245 140 - 400 Thousand/uL   MPV 9.9 7.5 - 12.5 fL   Neutro Abs 4,695 1,500 - 7,800 cells/uL   Lymphs Abs 2,003 850 - 3,900 cells/uL   Absolute Monocytes 600 200 - 950 cells/uL   Eosinophils Absolute 150 15 - 500 cells/uL   Basophils Absolute 53 0 - 200 cells/uL   Neutrophils Relative % 62.6 %   Total Lymphocyte 26.7 %   Monocytes Relative 8.0 %   Eosinophils Relative 2.0 %   Basophils Relative 0.7 %  Surgical pcr screen     Status: Abnormal   Collection Time: 09/17/21  1:23 PM   Specimen: Nasal Mucosa; Nasal Swab  Result Value Ref Range   MRSA, PCR NEGATIVE NEGATIVE   Staphylococcus aureus POSITIVE (A) NEGATIVE    Comment: (NOTE) The Xpert SA Assay (FDA approved for NASAL specimens in patients 270years of age and older), is one component of a comprehensive surveillance program. It is not intended to diagnose infection nor to guide or monitor treatment. Performed at [facility] [address] [id number]  SARS CORONAVIRUS 2 (TAT 6-24 HRS) Nasopharyngeal Nasopharyngeal Swab     Status: None   Collection Time: 09/17/21  1:24 PM   Specimen: Nasopharyngeal Swab  Result Value Ref Range   SARS Coronavirus 2 NEGATIVE NEGATIVE    Comment: (NOTE) SARS-CoV-2 target nucleic acids are NOT DETECTED.  The SARS-CoV-2 RNA is generally detectable in upper and lower respiratory specimens during the acute  phase of infection. Negative results do not preclude SARS-CoV-2 infection, do not rule out co-infections with other pathogens, and should not be used as the sole basis for treatment or other patient management decisions. Negative results must be combined with clinical observations, patient history, and epidemiological information. The expected result is Negative.  Fact Sheet for Patients: SugarRoll.be  Fact Sheet for Healthcare Providers: https://www.woods-mathews.com/  This test is not  yet approved or cleared by the [place] FDA and  has been authorized for detection and/or diagnosis of SARS-CoV-2 by FDA under an Emergency Use Authorization (EUA). This EUA will remain  in effect (meaning this test can be used) for the duration of the COVID-19 declaration under Se ction 564(b)(1) of the Act, 21 U.S.C. section 360bbb-3(b)(1), unless the authorization is terminated or revoked sooner.  Performed at [facility], [place] [address], [place], [place] [address]   Blood gas, arterial on room air     Status: Abnormal   Collection Time: 09/17/21  1:45 PM  Result Value Ref Range   FIO2 21.00    pH, Arterial 7.347 (L) 7.350 - 7.450   pCO2 arterial 35.6 32.0 - 48.0 mmHg   pO2, Arterial 106 83.0 - 108.0 mmHg   Bicarbonate 19.0 (L) 20.0 - 28.0 mmol/L   Acid-base deficit 5.6 (H) 0.0 - 2.0 mmol/L   O2 Saturation 98.0 %   Patient temperature 37.0    Collection site LEFT BRACHIAL    Drawn by [id number]    Sample type ARTERIAL DRAW    Allens test (pass/fail) BRACHIAL ARTERY (A) PASS    Comment: Performed at [facility], [place] [address], [place], [place] [address]  Type and screen     Status: None   Collection Time: 09/17/21  1:45 PM  Result Value Ref Range   ABO/RH(D) AB POS    Antibody Screen NEG    Sample Expiration 10/01/2021,2359    Extend sample reason      NO TRANSFUSIONS OR PREGNANCY IN THE PAST 3 MONTHS Performed at [facility], [place] [address], [place], [place] [address]   Urinalysis, Routine w reflex microscopic     Status: Abnormal   Collection Time: 09/17/21  2:14 PM  Result Value Ref Range   Color, Urine STRAW (A) YELLOW   APPearance CLEAR CLEAR   Specific Gravity, Urine 1.024 1.005 - 1.030   pH 5.0 5.0 - 8.0   Glucose, UA >=500 (A) NEGATIVE mg/dL   Hgb urine dipstick SMALL (A) NEGATIVE   Bilirubin Urine NEGATIVE NEGATIVE   Ketones, ur NEGATIVE NEGATIVE mg/dL   Protein, ur NEGATIVE NEGATIVE mg/dL   Nitrite NEGATIVE NEGATIVE   Leukocytes,Ua  NEGATIVE NEGATIVE   RBC / HPF 0-5 0 - 5 RBC/hpf   WBC, UA 0-5 0 - 5 WBC/hpf   Bacteria, UA NONE SEEN NONE SEEN    Comment: Performed at [facility] [address] [place], [place] [address]  APTT     Status: None   Collection Time: 09/17/21  2:15 PM  Result Value Ref Range   aPTT 26 24 - 36 seconds    Comment: Performed at [facility] [address], [place], [place] [address]  CBC     Status: Abnormal   Collection Time: 09/17/21  2:15 PM  Result Value Ref Range   WBC 6.4 4.0 - 10.5 K/uL   RBC 3.45 (L) 3.87 - 5.11 MIL/uL   Hemoglobin 9.8 (L) 12.0 - 15.0 g/dL  HCT 30.4 (L) 36.0 - 46.0 %   MCV 88.1 80.0 - 100.0 fL   MCH 28.4 26.0 - 34.0 pg   MCHC 32.2 30.0 - 36.0 g/dL   RDW 13.5 11.5 - 15.5 %   Platelets 242 150 - 400 K/uL   nRBC 0.0 0.0 - 0.2 %    Comment: Performed at [facility] [address] [place], [place] [address]  Comprehensive metabolic panel     Status: Abnormal   Collection Time: 09/17/21  2:15 PM  Result Value Ref Range   Sodium 133 (L) 135 - 145 mmol/L   Potassium 4.3 3.5 - 5.1 mmol/L   Chloride 105 98 - 111 mmol/L   CO2 18 (L) 22 - 32 mmol/L   Glucose, Bld 337 (H) 70 - 99 mg/dL    Comment: Glucose reference range applies only to samples taken after fasting for at least 8 hours.   BUN 22 (H) 6 - 20 mg/dL   Creatinine, Ser 1.34 (H) 0.44 - 1.00 mg/dL   Calcium 8.8 (L) 8.9 - 10.3 mg/dL   Total Protein 6.5 6.5 - 8.1 g/dL   Albumin 3.6 3.5 - 5.0 g/dL   AST 21 15 - 41 U/L   ALT 17 0 - 44 U/L   Alkaline Phosphatase 61 38 - 126 U/L   Total Bilirubin 1.2 0.3 - 1.2 mg/dL   GFR, Estimated 48 (L) >60 mL/min    Comment: (NOTE) Calculated using the CKD-EPI Creatinine Equation (2021)    Anion gap 10 5 - 15    Comment: Performed at [facility], [place] [address], [place], [place] [address]  Hemoglobin A1c     Status: Abnormal   Collection Time: 09/17/21  2:15 PM  Result Value Ref Range   Hgb A1c MFr Bld 9.0 (H) 4.8 - 5.6 %    Comment:  (NOTE) Pre diabetes:          5.7%-6.4%  Diabetes:              >6.4%  Glycemic control for   <7.0% adults with diabetes    Mean Plasma Glucose 211.6 mg/dL    Comment: Performed at [facility] [address], [place], [place] [address]  Protime-INR     Status: None   Collection Time: 09/17/21  2:15 PM  Result Value Ref Range   Prothrombin Time 12.7 11.4 - 15.2 seconds   INR 1.0 0.8 - 1.2    Comment: (NOTE) INR goal varies based on device and disease states. Performed at [facility], [place] [address] [place], [place] [address]       PHQ2/9: Depression screen [facility] 2/9 10/03/2021 08/21/2021 08/04/2021 06/24/2021 04/29/2021  Decreased Interest 0 0 0 0 1  Down, Depressed, Hopeless 0 0 0 0 0  PHQ - 2 Score 0 0 0 0 1  Altered sleeping 0 - 0 - 3  Tired, decreased energy 0 - 0 - 1  Change in appetite 0 - 0 - 0  Feeling bad or failure about yourself  0 - 0 - 0  Trouble concentrating 0 - 0 - 0  Moving slowly or fidgety/restless 0 - 0 - 0  Suicidal thoughts 0 - 0 - 0  PHQ-9 Score 0 - 0 - 5  Difficult doing work/chores - - Not difficult at all - -  Some recent data might be hidden    phq 9 is negative   Fall Risk: Fall Risk  10/03/2021  08/21/2021 08/04/2021 06/24/2021 04/29/2021  Falls in the past year? 0 0 0 0 0  Number falls in past yr: 0 0 0 0 0  Injury with Fall? 0 0 0 0 0  Risk for fall due to : No Fall Risks No Fall Risks - - -  Follow up Falls prevention discussed Falls prevention discussed - Falls evaluation completed -      Functional Status Survey: Is the patient deaf or have difficulty hearing?: No Does the patient have difficulty seeing, even when wearing glasses/contacts?: No Does the patient have difficulty concentrating, remembering, or making decisions?: No Does the patient have difficulty walking or climbing stairs?: No Does the patient have difficulty dressing or bathing?: No Does the patient have difficulty doing errands alone such as visiting a  doctor's office or shopping?: No    Assessment & Plan  1. 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(HCC)  On ARB  2. Hypertension associated with diabetes ([place])  Bp is at goal   3. Depression, major, in remission ([place])  Stable   4. Coronary artery disease due to lipid rich plaque   5. Angina pectoris associated with type 2 diabetes mellitus ([place])  Waiting for CABAG   6. URI, acute  - Novel Coronavirus, NAA ([facility]) - benzonatate (TESSALON) 100 MG capsule; Take 1-2 capsules (100-200 mg total) by mouth 3 (three) times daily as needed for cough.  Dispense: 40 capsule; Refill: 0  7. Severe nonproliferative diabetic retinopathy of right eye without macular edema associated with type 2 diabetes mellitus ([place])  Recent visit and injection right eye   8. Asthma exacerbation, mild  - Fluticasone-Umeclidin-Vilant (TRELEGY ELLIPTA) 100-62.5-25 MCG/ACT AEPB; Inhale 1 puff into the lungs daily.  Dispense: 1 each; Refill: 11 - benzonatate (TESSALON) 100 MG capsule; Take 1-2 capsules (100-200 mg total) by mouth 3 (three) times daily as needed for cough.  Dispense: 40 capsule; Refill: 0

## 2021-10-05 LAB — NOVEL CORONAVIRUS, NAA: SARS-CoV-2, NAA: NOT DETECTED

## 2021-10-05 LAB — SARS-COV-2, NAA 2 DAY TAT

## 2021-10-20 ENCOUNTER — Ambulatory Visit (INDEPENDENT_AMBULATORY_CARE_PROVIDER_SITE_OTHER): Payer: 59 | Admitting: Cardiology

## 2021-10-20 ENCOUNTER — Encounter: Payer: Self-pay | Admitting: Cardiology

## 2021-10-20 ENCOUNTER — Other Ambulatory Visit: Payer: Self-pay

## 2021-10-20 VITALS — BP 122/62 | HR 79 | Ht 69.0 in | Wt 181.0 lb

## 2021-10-20 DIAGNOSIS — E7800 Pure hypercholesterolemia, unspecified: Secondary | ICD-10-CM | POA: Diagnosis not present

## 2021-10-20 DIAGNOSIS — I255 Ischemic cardiomyopathy: Secondary | ICD-10-CM

## 2021-10-20 DIAGNOSIS — I2510 Atherosclerotic heart disease of native coronary artery without angina pectoris: Secondary | ICD-10-CM | POA: Diagnosis not present

## 2021-10-20 DIAGNOSIS — I10 Essential (primary) hypertension: Secondary | ICD-10-CM

## 2021-10-20 MED ORDER — ENTRESTO 24-26 MG PO TABS: 1.0000 | ORAL_TABLET | Freq: Two times a day (BID) | ORAL | 5 refills | Status: DC

## 2021-10-20 NOTE — Patient Instructions (Signed)
Medication Instructions:  Your physician recommends that you continue on your current medications as directed. Please refer to the Current Medication list given to you today.  *If you need a refill on your cardiac medications before your next appointment, please call your pharmacy*   Lab Work: None ordered If you have labs (blood work) drawn today and your tests are completely normal, you will receive your results only by: MyChart Message (if you have MyChart) OR A paper copy in the mail If you have any lab test that is abnormal or we need to change your treatment, we will call you to review the results.   Testing/Procedures: None ordered   Follow-Up: At [facility], you and your health needs are our priority.  As part of our continuing mission to provide you with exceptional heart care, we have created designated Provider Care Teams.  These Care Teams include your primary Cardiologist (physician) and Advanced Practice Providers (APPs -  Physician Assistants and Nurse Practitioners) who all work together to provide you with the care you need, when you need it.  We recommend signing up for the patient portal called "MyChart".  Sign up information is provided on this After Visit Summary.  MyChart is used to connect with patients for Virtual Visits (Telemedicine).  Patients are able to view lab/test results, encounter notes, upcoming appointments, etc.  Non-urgent messages can be sent to your provider as well.   To learn more about what you can do with MyChart, go to [email or web address].    Your next appointment:   4 month(s)  The format for your next appointment:   In Person  Provider:   You may see Brian Agbor-Etang, MD or one of the following Advanced Practice Providers on your designated Care Team:   Christopher Berge, NP Ryan Dunn, PA-C Cadence Furth, PA-C    Other Instructions   

## 2021-10-20 NOTE — Progress Notes (Addendum)
Cardiology Office Note:    Date:  10/20/2021   ID:  Sarah Duffy, DOB 1971/03/16, MRN [id number]  PCP:  Steele Sizer, MD   [place]  Cardiologist:  Kate Sable, MD  Advanced Practice Provider:  No care team member to display Electrophysiologist:  None       Referring MD: Steele Sizer, MD   Chief Complaint  Patient presents with   Other    3 month follow up -- Meds reviewed verbally with patient.     History of Present Illness:    Sarah Duffy is a 50 y.o. female with a hx of ischemic cardiomyopathy, EF 35 to 40%, multivessel CAD (80% mLAD, 99% OM 2, 70 to 80% pRCA.) hypertension, hyperlipidemia who presents for follow-up.   Surgical intervention was planned, but canceled due to medical staff illness.  She plans to call to reschedule procedure.  She denies chest pain or shortness of breath.  Feels well, has no other concerns at this time.  Takes all medications as prescribed.   Prior notes Echo 03/12/2021 EF 35 to 40%, moderate MR Left heart cath 04/14/2021 multivessel CAD, 80% mLAD, 99% OM 2, 70 to 80% pRCA.  Past Medical History:  Diagnosis Date   Coronary artery disease    COVID-19 2021   Diabetes mellitus without complication ([place])    Dyspnea    occasionally    Hypertension    MRSA infection within last 3 months 02/25/2016   Osteomyelitis of foot ([place]) 08/26/2016    Past Surgical History:  Procedure Laterality Date   CHOLECYSTECTOMY  1999   COLONOSCOPY WITH PROPOFOL N/A 03/25/2021   Procedure: COLONOSCOPY WITH PROPOFOL;  Surgeon: Jonathon Bellows, MD;  Location: [facility] ENDOSCOPY;  Service: Gastroenterology;  Laterality: N/A;   COLONOSCOPY WITH PROPOFOL N/A 04/17/2021   Procedure: COLONOSCOPY WITH PROPOFOL;  Surgeon: Jonathon Bellows, MD;  Location: [facility] ENDOSCOPY;  Service: Gastroenterology;  Laterality: N/A;   ESOPHAGOGASTRODUODENOSCOPY (EGD) WITH PROPOFOL N/A 03/25/2021   Procedure: ESOPHAGOGASTRODUODENOSCOPY (EGD) WITH PROPOFOL;  Surgeon:  Jonathon Bellows, MD;  Location: [facility] ENDOSCOPY;  Service: Gastroenterology;  Laterality: N/A;   ESOPHAGOGASTRODUODENOSCOPY (EGD) WITH PROPOFOL N/A 04/17/2021   Procedure: ESOPHAGOGASTRODUODENOSCOPY (EGD) WITH PROPOFOL;  Surgeon: Jonathon Bellows, MD;  Location: [facility] ENDOSCOPY;  Service: Gastroenterology;  Laterality: N/A;   RIGHT/LEFT HEART CATH AND CORONARY ANGIOGRAPHY Bilateral 04/14/2021   Procedure: RIGHT/LEFT HEART CATH AND CORONARY ANGIOGRAPHY;  Surgeon: Nelva Bush, MD;  Location: [place] CV LAB;  Service: Cardiovascular;  Laterality: Bilateral;   TOE SURGERY Left 02/07/2016   [place] To[place]    Current Medications: Current Meds  Medication Sig   albuterol (VENTOLIN HFA) 108 (90 Base) MCG/ACT inhaler INHALE 2 PUFFS INTO THE LUNGS EVERY 6 HOURS AS NEEDED FOR WHEEZING OR SHORTNESS OF BREATH   amLODipine (NORVASC) 5 MG tablet Take 1 tablet (5 mg total) by mouth daily.   aspirin EC 81 MG tablet Take 1 tablet (81 mg total) by mouth daily. Swallow whole.   atorvastatin (LIPITOR) 80 MG tablet Take 80 mg by mouth daily.   benzonatate (TESSALON) 100 MG capsule Take 1-2 capsules (100-200 mg total) by mouth 3 (three) times daily as needed for cough.   blood glucose meter kit and supplies Dispense based on patient and insurance preference. Use up to four times daily as directed. (FOR ICD-10 E10.9, E11.9).   carvedilol (COREG) 12.5 MG tablet Take 1 tablet (12.5 mg total) by mouth 2 (two) times daily.   Fluticasone-Umeclidin-Vilant (TRELEGY ELLIPTA) 100-62.5-25 MCG/ACT AEPB Inhale 1 puff  into the lungs daily.   furosemide (LASIX) 40 MG tablet Take 1 tablet (40 mg total) by mouth daily.   insulin glargine (LANTUS SOLOSTAR) 100 UNIT/ML Solostar Pen Inject 18-24 Units into the skin See admin instructions. Inject 18 units in the AM and 24 units at bedtime.   JARDIANCE 25 MG TABS tablet Take 25 mg by mouth daily.   ketoconazole (NIZORAL) 2 % cream Apply topically 2 (two) times daily.   Multiple Vitamins-Minerals  (MULTIVITAMIN WITH MINERALS) tablet Take 1 tablet by mouth daily.   nitroGLYCERIN (NITROSTAT) 0.4 MG SL tablet Place 1 tablet (0.4 mg total) under the tongue every 5 (five) minutes as needed for chest pain.   omeprazole (PRILOSEC) 40 MG capsule Take 1 capsule (40 mg total) by mouth daily.   spironolactone (ALDACTONE) 25 MG tablet Take 0.5 tablets (12.5 mg total) by mouth daily.   tirzepatide (MOUNJARO) 7.5 MG/0.5ML Pen Inject 7.5 mg into the skin once a week.   TRULANCE 3 MG TABS Take 1 tablet by mouth daily.   [DISCONTINUED] sacubitril-valsartan (ENTRESTO) 24-26 MG Take 1 tablet by mouth 2 (two) times daily.     Allergies:   Patient has no known allergies.   Social History   Socioeconomic History   Marital status: Single    Spouse name: Not on file   Number of children: 1   Years of education: Not on file   Highest education level: Some college, no degree  Occupational History   Occupation: Public affairs consultant   Tobacco Use   Smoking status: Never   Smokeless tobacco: Never  Vaping Use   Vaping Use: Never used  Substance and Sexual Activity   Alcohol use: No    Alcohol/week: 0.0 standard drinks   Drug use: No   Sexual activity: Yes    Partners: Male    Birth control/protection: Injection  Other Topics Concern   Not on file  Social History Narrative   She used to work at [employer], but  Feb 2017 she left work because of  MRSA infection.osteomyelitis and uncontrolled DM. She has been back to work since Feb 2018   Lives alone   Social Determinants of Health   Financial Resource Strain: Medium Risk   Difficulty of Paying Living Expenses: Somewhat hard  Food Insecurity: Landscape architect Present   Worried About Charity fundraiser in the Last Year: Often true   Arboriculturist in the Last Year: Often true  Transportation Needs: Public librarian (Medical): Yes   Lack of Transportation (Non-Medical): Yes  Physical Activity: Insufficiently  Active   Days of Exercise per Week: 4 days   Minutes of Exercise per Session: 30 min  Stress: Stress Concern Present   Feeling of Stress : Rather much  Social Connections: Moderately Isolated   Frequency of Communication with Friends and Family: More than three times a week   Frequency of Social Gatherings with Friends and Family: More than three times a week   Attends Religious Services: More than 4 times per year   Active Member of [employer] or Organizations: No   Attends Music therapist: Never   Marital Status: Never married     Family History: The patient's family history includes AAA (abdominal aortic aneurysm) in her father; Alzheimer's disease in her father; Breast cancer in her maternal grandmother; Diabetes in her father, maternal grandmother, and mother; Heart attack in her sister; Heart disease in her father; Hypertension in her  father; Seizures in her brother; Stroke in her father; Ulcers in her mother.  ROS:   Please see the history of present illness.     All other systems reviewed and are negative.  EKGs/Labs/Other Studies Reviewed:    The following studies were reviewed today:   EKG:  EKG is ordered today.  EKG shows normal sinus rhythm, left anterior fascicular block.  Recent Labs: 09/17/2021: ALT 17; BUN 22; Creatinine, Ser 1.34; Hemoglobin 9.8; Platelets 242; Potassium 4.3; Sodium 133  Recent Lipid Panel    Component Value Date/Time   CHOL 168 08/04/2021 0842   CHOL 208 (H) 02/28/2016 0951   TRIG 89 08/04/2021 0842   HDL 34 (L) 08/04/2021 0842   HDL 42 02/28/2016 0951   CHOLHDL 4.9 08/04/2021 0842   LDLCALC 115 (H) 08/04/2021 0842     Risk Assessment/Calculations:      Physical Exam:    VS:  BP 122/62 (BP Location: Left Arm, Patient Position: Sitting, Cuff Size: Normal)   Pulse 79   Ht 5' 9"  (1.753 m)   Wt 181 lb (82.1 kg)   SpO2 98%   BMI 26.73 kg/m     Wt Readings from Last 3 Encounters:  10/20/21 181 lb (82.1 kg)  10/03/21 178  lb (80.7 kg)  09/17/21 180 lb 3.2 oz (81.7 kg)     GEN:  Well nourished, well developed in no acute distress HEENT: Normal NECK: No JVD; No carotid bruits LYMPHATICS: No lymphadenopathy CARDIAC: RRR, no murmurs, rubs, gallops RESPIRATORY:  Clear to auscultation without rales, wheezing or rhonchi  ABDOMEN: Soft, non-tender, non-distended MUSCULOSKELETAL:  no edema; No deformity  SKIN: Warm and dry NEUROLOGIC:  Alert and oriented x 3 PSYCHIATRIC:  Normal affect   ASSESSMENT:    1. Coronary artery disease involving native coronary artery of native heart, unspecified whether angina present   2. Ischemic cardiomyopathy   3. Primary hypertension   4. Pure hypercholesterolemia      PLAN:    In order of problems listed above:  Multivessel CAD, denies chest pain.  Continue aspirin 81 mg, Lipitor 40 mg daily.  Plans to schedule CABG procedure with CT surgery. Ischemic cardiomyopathy, EF 35 to 40%.  Describes NYHA class II symptoms.  Continue Coreg 12.5 mg twice daily,Entresto 24- 26 mg twice daily, Aldactone.  Hypertension, BP controlled.  Continue Coreg, Entresto, Aldactone as above. Hyperlipidemia, Lipitor 40 mg daily.  Low-cholesterol diet recommended.  Titrate Lipitor to keep LDL less than 70.  Follow-up in 4 months   Medication Adjustments/Labs and Tests Ordered: Current medicines are reviewed at length with the patient today.  Concerns regarding medicines are outlined above.  Orders Placed This Encounter  Procedures   EKG 12-Lead     Meds ordered this encounter  Medications   sacubitril-valsartan (ENTRESTO) 24-26 MG    Sig: Take 1 tablet by mouth 2 (two) times daily.    Dispense:  60 tablet    Refill:  5      Patient Instructions  Medication Instructions:  Your physician recommends that you continue on your current medications as directed. Please refer to the Current Medication list given to you today.  *If you need a refill on your cardiac medications before  your next appointment, please call your pharmacy*   Lab Work: None ordered If you have labs (blood work) drawn today and your tests are completely normal, you will receive your results only by: [place] (if you have MyChart) OR A paper copy in the mail  If you have any lab test that is abnormal or we need to change your treatment, we will call you to review the results.   Testing/Procedures: None ordered   Follow-Up: At [facility], you and your health needs are our priority.  As part of our continuing mission to provide you with exceptional heart care, we have created designated Provider Care Teams.  These Care Teams include your primary Cardiologist (physician) and Advanced Practice Providers (APPs -  Physician Assistants and Nurse Practitioners) who all work together to provide you with the care you need, when you need it.  We recommend signing up for the patient portal called "MyChart".  Sign up information is provided on this After Visit Summary.  MyChart is used to connect with patients for Virtual Visits (Telemedicine).  Patients are able to view lab/test results, encounter notes, upcoming appointments, etc.  Non-urgent messages can be sent to your provider as well.   To learn more about what you can do with MyChart, go to [email or web address].    Your next appointment:   4 month(s)  The format for your next appointment:   In Person  Provider:   You may see Kate Sable, MD or one of the following Advanced Practice Providers on your designated Care Team:   Murray Hodgkins, NP Christell Faith, PA-C Cadence Kathlen Mody, [place]    Other Instructions    Signed, Kate Sable, MD  10/20/2021 12:33 PM    [place]

## 2021-10-29 ENCOUNTER — Telehealth: Payer: Self-pay

## 2021-10-29 NOTE — Telephone Encounter (Signed)
Left voicemail message requesting to have patient call back to verify which pharmacy she wants [facility] sent to. Received faxed refill request from [facility] in [place] however Rx was sent to [facility] in [place] on 10/20/21.

## 2021-10-31 ENCOUNTER — Other Ambulatory Visit: Payer: Self-pay | Admitting: *Deleted

## 2021-10-31 MED ORDER — ENTRESTO 24-26 MG PO TABS: 1.0000 | ORAL_TABLET | Freq: Two times a day (BID) | ORAL | 4 refills | Status: DC

## 2021-11-05 ENCOUNTER — Other Ambulatory Visit: Payer: Self-pay | Admitting: Family Medicine

## 2021-11-05 DIAGNOSIS — R809 Proteinuria, unspecified: Secondary | ICD-10-CM

## 2021-11-05 DIAGNOSIS — E1129 Type 2 diabetes mellitus with other diabetic kidney complication: Secondary | ICD-10-CM

## 2021-11-05 NOTE — Telephone Encounter (Signed)
Requested Prescriptions  Pending Prescriptions Disp Refills  . LANTUS SOLOSTAR 100 UNIT/ML Solostar Pen [Pharmacy Med Name: LANTUS SOLOSTAR PEN INJ 3ML] 15 mL 0    Sig: INJECT 18 UNITS UNDER THE SKIN EVERY MORNING AND 24 UNITS EVERY NIGHT AT BEDTIME     Endocrinology:  Diabetes - Insulins Failed - 11/05/2021  3:11 AM      Failed - HBA1C is between 0 and 7.9 and within 180 days    Hgb A1c MFr Bld  Date Value Ref Range Status  09/17/2021 9.0 (H) 4.8 - 5.6 % Final    Comment:    (NOTE) Pre diabetes:          5.7%-6.4%  Diabetes:              >6.4%  Glycemic control for   <7.0% adults with diabetes          Passed - Valid encounter within last 6 months    Recent Outpatient Visits          1 month ago 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[facility])   [facility] [name], MD   2 months ago 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[facility])   [facility] [name], MD   3 months ago 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[facility])   [facility] [name], MD   4 months ago Upper respiratory tract infection, unspecified type   [facility] Caro Laroche, DO   6 months ago Well adult exam   [facility] [name], MD      Future Appointments            In 2 months Carlynn Purl, Danna Hefty, MD [facility], [facility]   In 3 months Agbor-Etang, Arlys John, MD [facility], [facility]   In 5 months [name], MD [facility], [facility]

## 2021-11-20 ENCOUNTER — Other Ambulatory Visit: Payer: Self-pay | Admitting: *Deleted

## 2021-11-20 ENCOUNTER — Other Ambulatory Visit: Payer: Self-pay | Admitting: Medical

## 2021-11-20 MED ORDER — CARVEDILOL 12.5 MG PO TABS: 12.5000 mg | ORAL_TABLET | Freq: Two times a day (BID) | ORAL | 3 refills | Status: DC

## 2021-12-09 ENCOUNTER — Other Ambulatory Visit: Payer: Self-pay | Admitting: Family Medicine

## 2021-12-09 DIAGNOSIS — E1129 Type 2 diabetes mellitus with other diabetic kidney complication: Secondary | ICD-10-CM

## 2021-12-10 ENCOUNTER — Ambulatory Visit: Payer: Self-pay

## 2021-12-10 NOTE — Telephone Encounter (Signed)
The patient shares that they've had a "cold sore" on their lip since 12/04/21   The patient has noticed that the skin irritation has began to spread and would like to be prescribed something to help with their discomfort   Please contact further when possible   Left message to call back.

## 2021-12-10 NOTE — Telephone Encounter (Signed)
2nd Attempt, pt called, left VM to call back and speak to a nurse about symptoms.

## 2021-12-10 NOTE — Telephone Encounter (Signed)
Appt scheduled with Dr Caralee Ates

## 2021-12-10 NOTE — Telephone Encounter (Signed)
°  Chief Complaint:  Core sores" Symptoms: Core sores on top right side of lip. Frequency: Onset 12/04/21, Initially corner of mouth,now spreading to mid lip. Some swelling. painful Pertinent Negatives: Patient denies fever ,no sores elsewhere Disposition: [] ED /[] Urgent Care (no appt availability in office) / [] Appointment(In office/virtual)/ []  [facility]/ [x] Home Care/ [] Refused Recommended Disposition  Additional Notes: Pt requesting prescription med for sore sores, has had in past. States all OTC meds have not worked. Please advise.         Reason for Disposition  Sores last > 2 weeks  Answer Assessment - Initial Assessment Questions 1. APPEARANCE of BLISTERS: "Describe the sores."     Blistered, Wet and swollen" 2. SIZE: "How large an area is involved with the cold sores?" (e.g., inches, cm or compare to coins)     Corner of mouth to middle of mouth 3. LOCATION: "Which part of the lip is involved?"     Top lip right side 4. ONSET: "When did the fever blisters begin?"     12//22/22 5. RECURRENT BLISTERS: "Have you had fever blisters before?" If Yes, ask: "When was the last time?" "How many times a year?"     Yes, few years ago, OTC not working 6. OTHER SYMPTOMS: "Do you have any other symptoms?" (e.g., fever, sores inside mouth)     None. Painful  Protocols used: Cold Sores (Fever Blisters)-A-AH

## 2021-12-10 NOTE — Telephone Encounter (Signed)
Patient returning NT call to provide information.  Lip blisters began 6 days ago. She had fever several nights ago but none since and no other symptoms at this time. Is requesting medication to be sent to her pharmacy, [facility].

## 2021-12-12 ENCOUNTER — Telehealth (INDEPENDENT_AMBULATORY_CARE_PROVIDER_SITE_OTHER): Payer: 59 | Admitting: Internal Medicine

## 2021-12-12 DIAGNOSIS — B009 Herpesviral infection, unspecified: Secondary | ICD-10-CM

## 2021-12-12 MED ORDER — VALACYCLOVIR HCL 1 G PO TABS
1000.0000 mg | ORAL_TABLET | Freq: Two times a day (BID) | ORAL | 1 refills | Status: DC
Start: 2021-12-12 — End: 2022-01-23

## 2021-12-12 NOTE — Progress Notes (Signed)
Virtual Visit via Video Note  I connected with Sarah Duffy on 12/12/21 at  8:00 AM EST by a video enabled telemedicine application and verified that I am speaking with the correct person using two identifiers.  Location: Patient: Home  Provider: [facility]   I discussed the limitations of evaluation and management by telemedicine and the availability of in person appointments. The patient expressed understanding and agreed to proceed.  History of Present Illness:  Sarah Duffy is a 50 year old female presenting via telemedicine for concern for cold sores. Started on her right upper lip  last week, started feeling itchy and tingling. The next day a red sore came up in the corner of her right mouth with a small blister that spread quickly and was swollen and sore. Pain is a burning type pain. She denies fevers or other systemic symptoms. No sores or skin changes anywhere else. She has had this before, usually occurs on her mouth but it will resolve within 3 days. She has been using Abreva last week but that didn't work, also using Lysine cream as well. Last cold sore was about 2 years ago. She has never had to take an anti-viral oral medication before for this. She is up-to-date with her Shingles vaccination.    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 Discussed how the virus spreads and avoiding contact with the blister fluid to avoid spread. Follow up if symptoms worsen or fail to improve   - valACYclovir (VALTREX) 1000 MG tablet; Take 1 tablet (1,000 mg total) by mouth 2 (two) times daily.  Dispense: 10 tablet; Refill: 1   Follow Up  Instructions: PRN    I discussed the assessment and treatment plan with the patient. The patient was provided an opportunity to ask questions and all were answered. The patient agreed with the plan and demonstrated an understanding of the instructions.   The patient was advised to call back or seek an in-person evaluation if the symptoms worsen or if the condition fails to improve as anticipated.  I provided 17 minutes of non-face-to-face time during this encounter.   Margarita Mail, DO

## 2021-12-12 NOTE — Patient Instructions (Signed)
It was great seeing you today!  Plan discussed at today's visit: -Take anti-viral medication twice a day for 10 days, refills sent as well to keep in case in reoccurs. -Clear fluid in the blister is how the virus spreads so be careful about coming into contact with others  Follow up in: as needed or if symptoms worsen or fail to resolve  Take care and let us know if you have any questions or concerns prior to your next visit.  Dr. George Hugh Sore A cold sore, also called a fever blister, is a small, fluid-filled sore that forms inside the mouth or on the lips, gums, nose, chin, or cheeks. Cold sores can spread to other parts of the body, such as the eyes or fingers. In some people who have other medical conditions, cold sores can spread to multiple other body sites, including the genitals. Cold sores can spread from person to person (are contagious) until the sores crust over completely. Most cold sores go away within 2 weeks. What are the causes? Cold sores are caused by an infection from a common type of herpes simplex virus (HSV-1). HSV-1 is closely related to the HSV-2virus, which is the virus that causes genital herpes, but these viruses are not the same. Once a person is infected with HSV-1, the virus remains permanently in the body. HSV-1 is spread from person to person through close contact, such as through kissing, touching the affected area, or sharing personal items such as lip balm, razors, a drinking glass, or eating utensils. What increases the risk? You are more likely to develop this condition if you: Are tired, stressed, or sick. Are menstruating. Are pregnant. Take certain medicines. Are exposed to cold weather or too much sun. What are the signs or symptoms? Symptoms of a cold sore outbreak go through different stages. These are the stages of a cold sore: Tingling, itching, or burning is felt 1-2 days before the outbreak. Fluid-filled blisters appear on the lips,  inside the mouth, on the nose, or on the cheeks. The blisters start to ooze clear fluid. The blisters dry up, and a yellow crust appears in their place. The crust falls off. In some cases, other symptoms can develop during a cold sore outbreak. These can include: Fever. Sore throat. Headache. Muscle aches. Swollen neck glands. How is this diagnosed? This condition is diagnosed based on your medical history and a physical exam. Your health care provider may do a blood test or may swab some fluid from your sore and then examine the swab in the lab. How is this treated? There is no cure for cold sores or HSV-1. There is also no vaccine for HSV-1. Most cold sores go away on their own without treatment within 2 weeks. Medicines cannot make the infection go away, but your health care provider may prescribe medicines to: Help relieve some of the pain associated with the sores. Work to stop the virus from multiplying. Shorten healing time. Medicines may be in the form of creams, gels, pills, or a shot. Follow these instructions at home: Medicines Take or apply over-the-counter and prescription medicines only as told by your health care provider. Use a cotton-tip swab to apply creams or gels to your sores. Ask your health care provider if you can take lysine supplements. Research has found that lysine may help heal the cold sore faster and prevent outbreaks. Sore care  Do not touch the sores or pick the scabs. Wash your hands often. Do not  touch your eyes without washing your hands first. Keep the sores clean and dry. If directed, apply ice to the sores: Put ice in a plastic bag. Place a towel between your skin and the bag. Leave the ice on for 20 minutes, 2-3 times a day. Eating and drinking Eat a soft, bland diet. Avoid eating hot, cold, or salty foods. Use a straw if it hurts to drink out of a glass. Eat foods that are rich in lysine, such as meat, fish, and dairy products. Avoid sugary  foods, chocolates, nuts, and grains. These foods are rich in a nutrient called arginine, which can cause the virus to multiply. Lifestyle Do not kiss, have oral sex, or share personal items until your sores heal. Stress, poor sleep, and being out in the sun can trigger outbreaks. Make sure you: Do activities that help you relax, such as deep breathing exercises or meditation. Get enough sleep. Apply sunscreen on your lips before you go out in the sun. Contact a health care provider if: You have symptoms for more than 2 weeks. You have pus coming from the sores. You have redness that is spreading. You have pain or irritation in your eye. You get sores on your genitals. Your sores do not heal within 2 weeks. You have frequent cold sore outbreaks. Get help right away if you have: A fever and your symptoms suddenly get worse. A headache and confusion. Fatigue or loss of appetite. A stiff neck or sensitivity to light. Summary A cold sore, also called a fever blister, is a small, fluid-filled sore that forms inside the mouth or on the lips, gums, nose, chin, or cheeks. Most cold sores go away on their own without treatment within 2 weeks. Your health care provider may prescribe medicines to help relieve some of the pain, work to stop the virus from multiplying, and shorten healing time. Wash your hands often. Do not touch your eyes without washing your hands first. Do not kiss, have oral sex, or share personal items until your sores heal. Contact a health care provider if your sores do not heal within 2 weeks. This information is not intended to replace advice given to you by your health care provider. Make sure you discuss any questions you have with your health care provider. Document Revised: 06/05/2021 Document Reviewed: 05/02/2018 Elsevier Patient Education  2022 [employer].

## 2022-01-02 NOTE — Progress Notes (Signed)
Name: Sarah Duffy   MRN: [id number]    DOB: 11/05/1971   Date:01/05/2022       Progress Note  Subjective  Chief Complaint  Follow Up  HPI  DMII: history of left foot ulcer and osteomyelitis, microalbuminuria, hypertension, retinopathy and dyslipidemia.  She is currently on Lantus 24 units per day,  Jardiance 48m   and now on Victoza 1.8 instead of Mounjarno per insurance request. Glucose when she wakes up is around 320 - advised to go up on basal insulin and [facility] Recently had another injection on right eye due to increase in blurred vision. She had diabetic retinopathy. And microalbuminuria . She works at [employer] has been eating more pasta. Explained the importance of getting A1C to goal prior to open heart surgery, she asked to see if we can get [facility]   MDD: she had a gap in her insurance coverage and has been without medications for months. She lost her mother unexpectedly on Feb 4 th , 20.  She had a leave of absence because of her mental health. She states when her mother died  her sister and sister's boyfriend moved in and has been stressing her out. She was taking duloxetine and Abilify but stopped on her own, but states she is doing fine at this time She has a new insurance and will try to find a psychiatrist or psychologist that accepts her insurance now   HTN: bp is at goal, under the care of cardiologist, bp is at goal .   CAD/Chronic systolic heart failure/Cardiomyopathy  : under the care of Dr. AMylo Red [facility] , she will has a follow up with cardiovascular surgeon Dr. BCyndia Bentin GMescalto discuss , she was ready to have CABG on 09/19/2021 but it was cancelled by the physician due to personal problems. She denies any recent chest pain, taking medication daily . A1C is up. She had ischemic cardiomyopathy but currently no symptoms.  Denies orthopnea, PND or lower extremity edema   Conclusions Cath :   Multivessel coronary artery disease including mild to moderate diffuse plaquing of the LAD and RCA.  The mid/distal LAD demonstrates 3 focal lesions of up to 80% that are hemodynamically significant by iFR (iFR = 0.71).  There is also a 99% stenosis of OM2 with faint left to left collaterals supplying the distal branch as well as sequential 60% distal RCA and 70-80% proximal RPDA lesions.  Patient Active Problem List   Diagnosis Date Noted   Angina pectoris associated with type 2 diabetes mellitus ([place] 01/05/2022   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 edema associated with type 2 diabetes mellitus ([place] 05/01/2021   Cardiomyopathy ([place] 04/14/2021   Depression, major, recurrent, mild ([place] 09/25/2019   History of 2019 novel coronavirus disease (COVID-19) 06/23/2019   Migraine headache without aura 01/14/2018   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([place] 05/22/2017   Diabetic retinopathy of left eye, with macular edema, with severe nonproliferative retinopathy, associated with type 2 diabetes mellitus 05/21/2016   Severe nonproliferative diabetic retinopathy of right eye without macular edema associated with type 2 diabetes mellitus ([place] 05/21/2016   Hyperlipidemia 03/10/2016   MI (mitral incompetence) 02/25/2016   Insomnia 02/25/2016   Anxiety 09/04/2014    Past Surgical History:  Procedure Laterality Date   CHOLECYSTECTOMY  1999   COLONOSCOPY WITH PROPOFOL N/A  03/25/2021   Procedure: COLONOSCOPY WITH PROPOFOL;  Surgeon: Jonathon Bellows, MD;  [facility] END[facility]COPY;  Service: Gastroenterology;  Laterality: N/A;   COLONOSCOPY WITH PROPOFOL N/A 04/17/2021   Procedure: COLONOSCOPY WITH PROPOFOL;  Surgeon: Jonathon Bellows, MD;  Location: [facility] ENDOSCOPY;  Service: Gastroenterology;  Laterality: N/A;   ESOPHAGOGASTRODUODENOSCOPY (EGD) WITH PROPOFOL N/A 03/25/2021   Procedure: ESOPHAGOGASTRODUODENOSCOPY  (EGD) WITH PROPOFOL;  Surgeon: Jonathon Bellows, MD;  Location: [facility] ENDOSCOPY;  Service: Gastroenterology;  Laterality: N/A;   ESOPHAGOGASTRODUODENOSCOPY (EGD) WITH PROPOFOL N/A 04/17/2021   Procedure: ESOPHAGOGASTRODUODENOSCOPY (EGD) WITH PROPOFOL;  Surgeon: Jonathon Bellows, MD;  Location: [facility] ENDOSCOPY;  Service: Gastroenterology;  Laterality: N/A;   RIGHT/LEFT HEART CATH AND CORONARY ANGIOGRAPHY Bilateral 04/14/2021   Procedure: RIGHT/LEFT HEART CATH AND CORONARY ANGIOGRAPHY;  Surgeon: Nelva Bush, MD;  Location: [place] CV LAB;  Service: Cardiovascular;  Laterality: Bilateral;   TOE SURGERY Left 02/07/2016   Pinky Toe    Family History  Problem Relation Age of Onset   Diabetes Mother    Ulcers Mother    Heart disease Father    AAA (abdominal aortic aneurysm) Father    Diabetes Father    Hypertension Father    Stroke Father    Alzheimer's disease Father    Heart attack Sister    Seizures Brother    Diabetes Maternal Grandmother    Breast cancer Maternal Grandmother     Social History   Tobacco Use   Smoking status: Never   Smokeless tobacco: Never  Substance Use Topics   Alcohol use: No    Alcohol/week: 0.0 standard drinks     Current Outpatient Medications:    albuterol (VENTOLIN HFA) 108 (90 Base) MCG/ACT inhaler, INHALE 2 PUFFS INTO THE LUNGS EVERY 6 HOURS AS NEEDED FOR WHEEZING OR SHORTNESS OF BREATH, Disp: 6.7 g, Rfl: 5   amLODipine (NORVASC) 5 MG tablet, Take 1 tablet (5 mg total) by mouth daily., Disp: 90 tablet, Rfl: 0   aspirin EC 81 MG tablet, Take 1 tablet (81 mg total) by mouth daily. Swallow whole., Disp: , Rfl:    atorvastatin (LIPITOR) 80 MG tablet, Take 80 mg by mouth daily., Disp: , Rfl:    blood glucose meter kit and supplies, Dispense based on patient and insurance preference. Use up to four times daily as directed. (FOR ICD-10 E10.9, E11.9)., Disp: 1 each, Rfl: 0   carvedilol (COREG) 12.5 MG tablet, Take 1 tablet (12.5 mg total) by mouth 2 (two) times  daily., Disp: 60 tablet, Rfl: 3   Fluticasone-Umeclidin-Vilant (TRELEGY ELLIPTA) 100-62.5-25 MCG/ACT AEPB, Inhale 1 puff into the lungs daily., Disp: 1 each, Rfl: 11   JARDIANCE 25 MG TABS tablet, Take 25 mg by mouth daily., Disp: , Rfl:    LANTUS SOLOSTAR 100 UNIT/ML Solostar Pen, INJECT 18 UNITS UNDER THE SKIN EVERY MORNING AND 24UNITS EVERY EVENING AT BEDTIME, Disp: 15 mL, Rfl: 0   liraglutide (VICTOZA) 18 MG/3ML SOPN, Inject 1.8 mg into the skin daily., Disp: , Rfl:    Multiple Vitamins-Minerals (MULTIVITAMIN WITH MINERALS) tablet, Take 1 tablet by mouth daily., Disp: 100 tablet, Rfl: 0   nitroGLYCERIN (NITROSTAT) 0.4 MG SL tablet, Place 1 tablet (0.4 mg total) under the tongue every 5 (five) minutes as needed for chest pain., Disp: 25 tablet, Rfl: prn   omeprazole (PRILOSEC) 40 MG capsule, Take 1 capsule (40 mg total) by mouth daily., Disp: 90 capsule, Rfl: 3   sacubitril-valsartan (ENTRESTO) 24-26 MG, Take 1 tablet by mouth 2 (two) times daily., Disp: 60 tablet,  Rfl: 4   spironolactone (ALDACTONE) 25 MG tablet, TAKE 1/2 TABLET(12.5 MG) BY MOUTH DAILY, Disp: 15 tablet, Rfl: 5   TRULANCE 3 MG TABS, Take 1 tablet by mouth daily., Disp: , Rfl:    furosemide (LASIX) 40 MG tablet, Take 1 tablet (40 mg total) by mouth daily., Disp: 30 tablet, Rfl: 3   valACYclovir (VALTREX) 1000 MG tablet, Take 1 tablet (1,000 mg total) by mouth 2 (two) times daily. (Patient not taking: Reported on 01/05/2022), Disp: 10 tablet, Rfl: 1  No Known Allergies  I personally reviewed active problem list, medication list, allergies, family history, social history, health maintenance with the patient/caregiver today.   ROS  Constitutional: Negative for fever or weight change.  Respiratory: Negative for cough and shortness of breath.   Cardiovascular: Negative for chest pain or palpitations.  Gastrointestinal: Negative for abdominal pain, no bowel changes.  Musculoskeletal: Negative for gait problem or joint swelling.   Skin: Negative for rash.  Neurological: Negative for dizziness or headache.  No other specific complaints in a complete review of systems (except as listed in HPI above).   Objective  Vitals:   01/05/22 1048  BP: 122/70  Pulse: 81  Resp: 16  Temp: 98.2 F (36.8 C)  SpO2: 98%  Weight: 177 lb (80.3 kg)  Height: 5' 9"  (1.753 m)    Body mass index is 26.14 kg/m.  Physical Exam  Constitutional: Patient appears well-developed and well-nourished.  No distress.  HEENT: head atraumatic, normocephalic, pupils equal and reactive to light, neck supple, Cardiovascular: Normal rate, regular rhythm and normal heart sounds.  No murmur heard. No BLE edema. Pulmonary/Chest: Effort normal and breath sounds normal. No respiratory distress. Abdominal: Soft.  There is no tenderness. Psychiatric: Patient has a normal mood and affect. behavior is normal. Judgment and thought content normal.   Recent Results (from the past 2160 hour(s))  POCT HgB A1C     Status: Abnormal   Collection Time: 01/05/22 10:52 AM  Result Value Ref Range   Hemoglobin A1C 10.1 (A) 4.0 - 5.6 %   HbA1c POC (<> result, manual entry)     HbA1c, POC (prediabetic range)     HbA1c, POC (controlled diabetic range)      PHQ2/9: Depression screen [facility] 2/9 01/05/2022 10/03/2021 08/21/2021 08/04/2021 06/24/2021  Decreased Interest 0 0 0 0 0  Down, Depressed, Hopeless 0 0 0 0 0  PHQ - 2 Score 0 0 0 0 0  Altered sleeping 0 0 - 0 -  Tired, decreased energy 1 0 - 0 -  Change in appetite 0 0 - 0 -  Feeling bad or failure about yourself  0 0 - 0 -  Trouble concentrating 0 0 - 0 -  Moving slowly or fidgety/restless 0 0 - 0 -  Suicidal thoughts 0 0 - 0 -  PHQ-9 Score 1 0 - 0 -  Difficult doing work/chores - - - Not difficult at all -  Some recent data might be hidden    phq 9 is negative   Fall Risk: Fall Risk  01/05/2022 10/03/2021 08/21/2021 08/04/2021 06/24/2021  Falls in the past year? 0 0 0 0 0  Number falls in past yr: 0 0 0 0  0  Injury with Fall? 0 0 0 0 0  Risk for fall due to : No Fall Risks No Fall Risks No Fall Risks - -  Follow up Falls prevention discussed Falls prevention discussed Falls prevention discussed - Falls evaluation completed  Functional Status Survey: Is the patient deaf or have difficulty hearing?: No Does the patient have difficulty seeing, even when wearing glasses/contacts?: No Does the patient have difficulty concentrating, remembering, or making decisions?: No Does the patient have difficulty walking or climbing stairs?: No Does the patient have difficulty dressing or bathing?: No Does the patient have difficulty doing errands alone such as visiting a doctor's office or shopping?: No    Assessment & Plan  1. 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(HCC)  - POCT HgB A1C - Ambulatory referral to Endocrinology  2. Hypertension associated with diabetes ([place])  - Ambulatory referral to Endocrinology  3. Angina pectoris associated with type 2 diabetes mellitus ([place])  No recent episodes of chest pain, waiting to have open heart surgery  4. Chronic systolic heart failure (HCC)  Stable   5. Depression, major, in remission [facility])  Doing better but she wants to see therapist   6. 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 edema associated with type 2 diabetes mellitus ([place])   7. Coronary artery disease due to lipid rich plaque   8. Ischemic cardiomyopathy

## 2022-01-05 ENCOUNTER — Ambulatory Visit (INDEPENDENT_AMBULATORY_CARE_PROVIDER_SITE_OTHER): Payer: 59 | Admitting: Family Medicine

## 2022-01-05 ENCOUNTER — Encounter: Payer: Self-pay | Admitting: Family Medicine

## 2022-01-05 ENCOUNTER — Other Ambulatory Visit: Payer: Self-pay

## 2022-01-05 VITALS — BP 122/70 | HR 81 | Temp 98.2°F | Resp 16 | Ht 69.0 in | Wt 177.0 lb

## 2022-01-05 DIAGNOSIS — E1129 Type 2 diabetes mellitus with other diabetic kidney complication: Secondary | ICD-10-CM

## 2022-01-05 DIAGNOSIS — F325 Major depressive disorder, single episode, in full remission: Secondary | ICD-10-CM

## 2022-01-05 DIAGNOSIS — I5022 Chronic systolic (congestive) heart failure: Secondary | ICD-10-CM

## 2022-01-05 DIAGNOSIS — E1159 Type 2 diabetes mellitus with other circulatory complications: Secondary | ICD-10-CM

## 2022-01-05 DIAGNOSIS — E113513 Type 2 diabetes mellitus with proliferative diabetic retinopathy with macular edema, bilateral: Secondary | ICD-10-CM

## 2022-01-05 DIAGNOSIS — I2510 Atherosclerotic heart disease of native coronary artery without angina pectoris: Secondary | ICD-10-CM

## 2022-01-05 DIAGNOSIS — L97509 Non-pressure chronic ulcer of other part of unspecified foot with unspecified severity: Secondary | ICD-10-CM | POA: Insufficient documentation

## 2022-01-05 DIAGNOSIS — R809 Proteinuria, unspecified: Secondary | ICD-10-CM | POA: Diagnosis not present

## 2022-01-05 DIAGNOSIS — I152 Hypertension secondary to endocrine disorders: Secondary | ICD-10-CM

## 2022-01-05 DIAGNOSIS — I209 Angina pectoris, unspecified: Secondary | ICD-10-CM

## 2022-01-05 DIAGNOSIS — I2583 Coronary atherosclerosis due to lipid rich plaque: Secondary | ICD-10-CM

## 2022-01-05 DIAGNOSIS — I255 Ischemic cardiomyopathy: Secondary | ICD-10-CM

## 2022-01-05 LAB — POCT GLYCOSYLATED HEMOGLOBIN (HGB A1C): Hemoglobin A1C: 10.1 % — AB (ref 4.0–5.6)

## 2022-01-06 ENCOUNTER — Ambulatory Visit: Payer: Self-pay | Admitting: *Deleted

## 2022-01-06 NOTE — Telephone Encounter (Signed)
Summary: left side sore throat, cough and sneezing   Patient called in to inform Dr Carlynn Purl that she have started with scratchy throat, sneezing and cough last night and today her throat feels a bit sore but everything is on the left side. Need to know if there is something she can get or does she need to be seen again and was seen on yesterday 01/05/22. Please call Ph# [phone]     Attempted to contact patient regarding her symptoms- left message to call office.

## 2022-01-06 NOTE — Telephone Encounter (Signed)
2nd attempt, Patient called, left VM to return the call to the office to discuss symptoms with a nurse.  

## 2022-01-06 NOTE — Telephone Encounter (Signed)
Reason for Disposition  [1] Sore throat with cough/cold symptoms AND [2] present > 5 days  Answer Assessment - Initial Assessment Questions 1. ONSET: "When did the throat start hurting?" (Hours or days ago)      Teacher, English as a foreign language.   It's scratchy on the left side of my throat.  Sneezing and coughing started yesterday too.   I'm having body aches.   I'm tired virus.   I'm having post nasal drip.    2. SEVERITY: "How bad is the sore throat?" (Scale 1-10; mild, moderate or severe)   - MILD (1-3):  doesn't interfere with eating or normal activities   - MODERATE (4-7): interferes with eating some solids and normal activities   - SEVERE (8-10):  excruciating pain, interferes with most normal activities   - SEVERE DYSPHAGIA: can't swallow liquids, drooling     *No Answer* 3. STREP EXPOSURE: "Has there been any exposure to strep within the past week?" If Yes, ask: "What type of contact occurred?"      *No Answer* 4.  VIRAL SYMPTOMS: "Are there any symptoms of a cold, such as a runny nose, cough, hoarse voice or red eyes?"      *No Answer* 5. FEVER: "Do you have a fever?" If Yes, ask: "What is your temperature, how was it measured, and when did it start?"     *No Answer* 6. PUS ON THE TONSILS: "Is there pus on the tonsils in the back of your throat?"     *No Answer* 7. OTHER SYMPTOMS: "Do you have any other symptoms?" (e.g., difficulty breathing, headache, rash)     *No Answer* 8. PREGNANCY: "Is there any chance you are pregnant?" "When was your last menstrual period?"     *No Answer*  Protocols used: Sore Throat-A-AH  Chief Complaint: URI symptoms that started last night Symptoms: post nasal drip, sore throat on left side, sneezing, body aches, coughing. Frequency: Started last night.   Pt was seen yesterday by Dr. Carlynn Purl and was feeling fine then. Pertinent Negatives: Patient denies fever Disposition: [] ED /[] Urgent Care (no appt availability in office) / [] Appointment(In office/virtual)/ []  [facility]/ [] Home Care/ [] Refused Recommended Disposition /[] [place] [facility]/ [x]  Follow-up with PCP Additional Notes: I called into the office since there were no appts available.   They requested I send my notes over and they would check with Dr since pt was just seen yesterday she may not need to come back in.    Pt was agreeable to this plan.

## 2022-01-07 ENCOUNTER — Ambulatory Visit (INDEPENDENT_AMBULATORY_CARE_PROVIDER_SITE_OTHER): Payer: 59 | Admitting: Family Medicine

## 2022-01-07 ENCOUNTER — Encounter: Payer: Self-pay | Admitting: Family Medicine

## 2022-01-07 ENCOUNTER — Other Ambulatory Visit: Payer: Self-pay

## 2022-01-07 VITALS — BP 130/68 | HR 77 | Temp 98.5°F | Resp 16 | Ht 69.0 in | Wt 177.0 lb

## 2022-01-07 DIAGNOSIS — B349 Viral infection, unspecified: Secondary | ICD-10-CM | POA: Diagnosis not present

## 2022-01-07 DIAGNOSIS — R051 Acute cough: Secondary | ICD-10-CM | POA: Diagnosis not present

## 2022-01-07 MED ORDER — BENZONATATE 100 MG PO CAPS: 100.0000 mg | ORAL_CAPSULE | Freq: Two times a day (BID) | ORAL | 0 refills | Status: DC | PRN

## 2022-01-07 NOTE — Progress Notes (Signed)
Name: Sarah Duffy   MRN: [id number]    DOB: June 30, 1971   Date:01/07/2022       Progress Note  Subjective  Chief Complaint  Possible COVID  HPI  Viral illness: symptoms started two nights ago, initially rhinorrhea, sneezing, sore throat on left side, followed a dry cough , no SOB or wheezing. She states temperature last night 99. Appetite is poor. No nausea, vomiting or diarrhea. She is feeling more tired than usual. Denies chills.   She all COVID-19 vaccines including Bivalent booster    Patient Active Problem List   Diagnosis Date Noted   Angina pectoris associated with type 2 diabetes mellitus ([place]) 01/05/2022   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 edema associated with type 2 diabetes mellitus ([place]) 05/01/2021   Cardiomyopathy ([place]) 04/14/2021   Depression, major, recurrent, mild ([place]) 09/25/2019   History of 2019 novel coronavirus disease (COVID-19) 06/23/2019   Migraine headache without aura 01/14/2018   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([place]) 05/22/2017   Diabetic retinopathy of left eye, with macular edema, with severe nonproliferative retinopathy, associated with type 2 diabetes mellitus 05/21/2016   Severe nonproliferative diabetic retinopathy of right eye without macular edema associated with type 2 diabetes mellitus ([place]) 05/21/2016   Hyperlipidemia 03/10/2016   MI (mitral incompetence) 02/25/2016   Insomnia 02/25/2016   Anxiety 09/04/2014    Past Surgical History:  Procedure Laterality Date   CHOLECYSTECTOMY  1999   COLONOSCOPY WITH PROPOFOL N/A 03/25/2021   Procedure: COLONOSCOPY WITH PROPOFOL;  Surgeon: Jonathon Bellows, MD;  Location: [facility] ENDOSCOPY;  Service: Gastroenterology;  Laterality: N/A;   COLONOSCOPY WITH PROPOFOL N/A 04/17/2021   Procedure: COLONOSCOPY WITH PROPOFOL;  Surgeon: Jonathon Bellows, MD;  Location: [facility] ENDOSCOPY;  Service: Gastroenterology;  Laterality: N/A;   ESOPHAGOGASTRODUODENOSCOPY (EGD) WITH PROPOFOL N/A 03/25/2021    Procedure: ESOPHAGOGASTRODUODENOSCOPY (EGD) WITH PROPOFOL;  Surgeon: Jonathon Bellows, MD;  Location: [facility] ENDOSCOPY;  Service: Gastroenterology;  Laterality: N/A;   ESOPHAGOGASTRODUODENOSCOPY (EGD) WITH PROPOFOL N/A 04/17/2021   Procedure: ESOPHAGOGASTRODUODENOSCOPY (EGD) WITH PROPOFOL;  Surgeon: Jonathon Bellows, MD;  Location: [facility] ENDOSCOPY;  Service: Gastroenterology;  Laterality: N/A;   RIGHT/LEFT HEART CATH AND CORONARY ANGIOGRAPHY Bilateral 04/14/2021   Procedure: RIGHT/LEFT HEART CATH AND CORONARY ANGIOGRAPHY;  Surgeon: Nelva Bush, MD;  Location: [place] CV LAB;  Service: Cardiovascular;  Laterality: Bilateral;   TOE SURGERY Left 02/07/2016   Pinky Toe    Family History  Problem Relation Age of Onset   Diabetes Mother    Ulcers Mother    Heart disease Father    AAA (abdominal aortic aneurysm) Father    Diabetes Father    Hypertension Father    Stroke Father    Alzheimer's disease Father    Heart attack Sister    Seizures Brother    Diabetes Maternal Grandmother    Breast cancer Maternal Grandmother     Social History   Tobacco Use   Smoking status: Never   Smokeless tobacco: Never  Substance Use Topics   Alcohol use: No    Alcohol/week: 0.0 standard drinks     Current Outpatient Medications:    albuterol (VENTOLIN HFA) 108 (90 Base) MCG/ACT inhaler, INHALE 2 PUFFS INTO THE LUNGS EVERY 6 HOURS AS NEEDED FOR WHEEZING OR SHORTNESS OF BREATH, Disp: 6.7 g, Rfl: 5   amLODipine (NORVASC) 5 MG tablet, Take 1 tablet (5 mg total) by mouth daily., Disp: 90 tablet, Rfl: 0   aspirin EC 81 MG tablet, Take 1 tablet (81 mg total) by mouth daily.  Swallow whole., Disp: , Rfl:    atorvastatin (LIPITOR) 80 MG tablet, Take 80 mg by mouth daily., Disp: , Rfl:    blood glucose meter kit and supplies, Dispense based on patient and insurance preference. Use up to four times daily as directed. (FOR ICD-10 E10.9, E11.9)., Disp: 1 each, Rfl: 0   carvedilol (COREG) 12.5 MG tablet, Take 1 tablet  (12.5 mg total) by mouth 2 (two) times daily., Disp: 60 tablet, Rfl: 3   Fluticasone-Umeclidin-Vilant (TRELEGY ELLIPTA) 100-62.5-25 MCG/ACT AEPB, Inhale 1 puff into the lungs daily., Disp: 1 each, Rfl: 11   JARDIANCE 25 MG TABS tablet, Take 25 mg by mouth daily., Disp: , Rfl:    LANTUS SOLOSTAR 100 UNIT/ML Solostar Pen, INJECT 18 UNITS UNDER THE SKIN EVERY MORNING AND 24UNITS EVERY EVENING AT BEDTIME, Disp: 15 mL, Rfl: 0   liraglutide (VICTOZA) 18 MG/3ML SOPN, Inject 1.8 mg into the skin daily., Disp: , Rfl:    Multiple Vitamins-Minerals (MULTIVITAMIN WITH MINERALS) tablet, Take 1 tablet by mouth daily., Disp: 100 tablet, Rfl: 0   nitroGLYCERIN (NITROSTAT) 0.4 MG SL tablet, Place 1 tablet (0.4 mg total) under the tongue every 5 (five) minutes as needed for chest pain., Disp: 25 tablet, Rfl: prn   omeprazole (PRILOSEC) 40 MG capsule, Take 1 capsule (40 mg total) by mouth daily., Disp: 90 capsule, Rfl: 3   sacubitril-valsartan (ENTRESTO) 24-26 MG, Take 1 tablet by mouth 2 (two) times daily., Disp: 60 tablet, Rfl: 4   spironolactone (ALDACTONE) 25 MG tablet, TAKE 1/2 TABLET(12.5 MG) BY MOUTH DAILY, Disp: 15 tablet, Rfl: 5   TRULANCE 3 MG TABS, Take 1 tablet by mouth daily., Disp: , Rfl:    valACYclovir (VALTREX) 1000 MG tablet, Take 1 tablet (1,000 mg total) by mouth 2 (two) times daily., Disp: 10 tablet, Rfl: 1   furosemide (LASIX) 40 MG tablet, Take 1 tablet (40 mg total) by mouth daily., Disp: 30 tablet, Rfl: 3  No Known Allergies  I personally reviewed active problem list, medication list, allergies, family history, social history, health maintenance with the patient/caregiver today.   ROS  Ten systems reviewed and is negative except as mentioned in HPI   Objective  Vitals:   01/07/22 0930  BP: 130/68  Pulse: 77  Resp: 16  Temp: 98.5 F (36.9 C)  SpO2: 99%  Weight: 177 lb (80.3 kg)  Height: 5' 9"  (1.753 m)    Body mass index is 26.14 kg/m.  Physical Exam  Constitutional:  Patient appears well-developed and well-nourished.  No distress.  HEENT: head atraumatic, normocephalic, pupils equal and reactive to light, ears normal, mild anterior lymphadenopathy , neck supple, throat within normal limits Cardiovascular: Normal rate, regular rhythm and normal heart sounds.  No murmur heard. No BLE edema. Pulmonary/Chest: Effort normal and breath sounds normal. No respiratory distress. Abdominal: Soft.  There is no tenderness. Psychiatric: Patient has a normal mood and affect. behavior is normal. Judgment and thought content normal.   Recent Results (from the past 2160 hour(s))  POCT HgB A1C     Status: Abnormal   Collection Time: 01/05/22 10:52 AM  Result Value Ref Range   Hemoglobin A1C 10.1 (A) 4.0 - 5.6 %   HbA1c POC (<> result, manual entry)     HbA1c, POC (prediabetic range)     HbA1c, POC (controlled diabetic range)       PHQ2/9: Depression screen [facility] 2/9 01/07/2022 01/05/2022 10/03/2021 08/21/2021 08/04/2021  Decreased Interest 0 0 0 0 0  Down, Depressed, Hopeless  0 0 0 0 0  PHQ - 2 Score 0 0 0 0 0  Altered sleeping 0 0 0 - 0  Tired, decreased energy 0 1 0 - 0  Change in appetite 0 0 0 - 0  Feeling bad or failure about yourself  0 0 0 - 0  Trouble concentrating 0 0 0 - 0  Moving slowly or fidgety/restless 0 0 0 - 0  Suicidal thoughts 0 0 0 - 0  PHQ-9 Score 0 1 0 - 0  Difficult doing work/chores - - - - Not difficult at all  Some recent data might be hidden    phq 9 is negative   Fall Risk: Fall Risk  01/07/2022 01/05/2022 10/03/2021 08/21/2021 08/04/2021  Falls in the past year? 0 0 0 0 0  Number falls in past yr: 0 0 0 0 0  Injury with Fall? 0 0 0 0 0  Risk for fall due to : No Fall Risks No Fall Risks No Fall Risks No Fall Risks -  Follow up Falls prevention discussed Falls prevention discussed Falls prevention discussed Falls prevention discussed -    Functional Status Survey: Is the patient deaf or have difficulty hearing?: No Does the patient  have difficulty seeing, even when wearing glasses/contacts?: No Does the patient have difficulty concentrating, remembering, or making decisions?: No Does the patient have difficulty walking or climbing stairs?: No Does the patient have difficulty dressing or bathing?: No Does the patient have difficulty doing errands alone such as visiting a doctor's office or shopping?: No    Assessment & Plan  1. Acute cough  - Novel Coronavirus, NAA ([facility]) - benzonatate (TESSALON) 100 MG capsule; Take 1 capsule (100 mg total) by mouth 2 (two) times daily as needed for cough.  Dispense: 40 capsule; Refill: 0  Discussed fluids, rest, time off work, vitamin C, Zinc and monitor for SOB, stay out of work until Saturday if test positive, otherwise may go sooner   May return to work sooner if [place] -19 test negative, otherwise quarantine for 5 days at home, after that 5 more days out of the house with a mask  2. Viral illness  - benzonatate (TESSALON) 100 MG capsule; Take 1 capsule (100 mg total) by mouth 2 (two) times daily as needed for cough.  Dispense: 40 capsule; Refill: 0

## 2022-01-08 LAB — SPECIMEN STATUS REPORT

## 2022-01-08 LAB — SARS-COV-2, NAA 2 DAY TAT

## 2022-01-08 LAB — NOVEL CORONAVIRUS, NAA: SARS-CoV-2, NAA: NOT DETECTED

## 2022-01-10 ENCOUNTER — Other Ambulatory Visit: Payer: Self-pay | Admitting: Family Medicine

## 2022-01-10 DIAGNOSIS — E1129 Type 2 diabetes mellitus with other diabetic kidney complication: Secondary | ICD-10-CM

## 2022-01-10 NOTE — Telephone Encounter (Signed)
Requested Prescriptions  Pending Prescriptions Disp Refills   LANTUS SOLOSTAR 100 UNIT/ML Solostar Pen [Pharmacy Med Name: LANTUS SOLOSTAR PEN INJ 3ML] 15 mL 2    Sig: INJECT 18 UNITS UNDER THE SKIN EVERY MORNING AND 24 UNITS EVERY EVENING AT BEDTIME     Endocrinology:  Diabetes - Insulins Failed - 01/10/2022  3:10 AM      Failed - HBA1C is between 0 and 7.9 and within 180 days    Hemoglobin A1C  Date Value Ref Range Status  01/05/2022 10.1 (A) 4.0 - 5.6 % Final   Hgb A1c MFr Bld  Date Value Ref Range Status  09/17/2021 9.0 (H) 4.8 - 5.6 % Final    Comment:    (NOTE) Pre diabetes:          5.7%-6.4%  Diabetes:              >6.4%  Glycemic control for   <7.0% adults with diabetes          Passed - Valid encounter within last 6 months    Recent Outpatient Visits          3 days ago Acute cough   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

## 2022-01-13 ENCOUNTER — Other Ambulatory Visit: Payer: Self-pay | Admitting: Family Medicine

## 2022-01-13 DIAGNOSIS — J069 Acute upper respiratory infection, unspecified: Secondary | ICD-10-CM

## 2022-01-13 NOTE — Telephone Encounter (Signed)
Requested Prescriptions  Pending Prescriptions Disp Refills   albuterol (VENTOLIN HFA) 108 (90 Base) MCG/ACT inhaler [Pharmacy Med Name: ALBUTEROL HFA INH (200 PUFFS) 6.7GM] 6.7 g 5    Sig: INHALE 2 PUFFS INTO THE LUNGS EVERY 6 HOURS AS NEEDED FOR WHEEZING OR SHORTNESS OF BREATH     Pulmonology:  Beta Agonists Failed - 01/13/2022  3:10 AM      Failed - One inhaler should last at least one month. If the patient is requesting refills earlier, contact the patient to check for uncontrolled symptoms.      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[facility])   [facility] [name], MD   1 month ago Herpes simplex   [facility] Margarita Mail, DO   3 months ago 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[place])   [facility] [name], MD   4 months ago 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

## 2022-01-20 ENCOUNTER — Other Ambulatory Visit: Payer: Self-pay

## 2022-01-20 MED ORDER — FUROSEMIDE 40 MG PO TABS: 40.0000 mg | ORAL_TABLET | Freq: Every day | ORAL | 3 refills | Status: DC

## 2022-01-23 ENCOUNTER — Other Ambulatory Visit: Payer: Self-pay | Admitting: Internal Medicine

## 2022-01-23 DIAGNOSIS — B009 Herpesviral infection, unspecified: Secondary | ICD-10-CM

## 2022-01-23 NOTE — Telephone Encounter (Signed)
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[facility])   [facility] Steele Sizer, MD   1 month ago Herpes simplex   [facility] Teodora Medici, DO   3 months ago 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, [facility]

## 2022-02-17 ENCOUNTER — Ambulatory Visit: Payer: 59 | Admitting: Cardiology

## 2022-03-09 ENCOUNTER — Ambulatory Visit: Payer: Self-pay

## 2022-03-09 NOTE — Telephone Encounter (Signed)
Summary: advice - sinus issues  ? Pt was calling in to see if something could be possibly sent in or what she should take, pts eyes are itchy and watering, possible sinus issues, cough, congestion, pt has been feeling unwell since Friday 3/24 and needed advice.   ?  ?Called pt lmom to return our call. ?

## 2022-03-09 NOTE — Telephone Encounter (Signed)
?  Chief Complaint: Cough, Sinus pain ?Symptoms: Dry cough, runny eyes, nose, chills,headache behind eyes, facial pain. HAs tried "Many OTC meds, no help" ?Frequency: Friday 03/06/22 ?Pertinent Negatives: Patient denies SOB ?Disposition: [] ED /[] Urgent Care (no appt availability in office) / [x] Appointment(In office/virtual)/ []  [facility]/ [] Home Care/ [] Refused Recommended Disposition /[] [place] [facility]/ []  Follow-up with PCP ?Additional Notes: Tested neg for covid Friday. CAre advise per protocol, verbalizes understanding. ?Reason for Disposition ? [1] Continuous (nonstop) coughing interferes with work or school AND [2] no improvement using cough treatment per Care Advice ? ?Answer Assessment - Initial Assessment Questions ?1. ONSET: "When did the cough begin?"  ?    03/06/22 ?2. SEVERITY: "How bad is the cough today?"  ?    Spells during day, awake at night ?3. SPUTUM: "Describe the color of your sputum" (none, dry cough; clear, white, yellow, green) ?    Dry ?4. HEMOPTYSIS: "Are you coughing up any blood?" If so ask: "How much?" (flecks, streaks, tablespoons, etc.) ?    no ?5. DIFFICULTY BREATHING: "Are you having difficulty breathing?" If Yes, ask: "How bad is it?" (e.g., mild, moderate, severe)  ?  - MILD: No SOB at rest, mild SOB with walking, speaks normally in sentences, can lie down, no retractions, pulse < 100.  ?  - MODERATE: SOB at rest, SOB with minimal exertion and prefers to sit, cannot lie down flat, speaks in phrases, mild retractions, audible wheezing, pulse 100-120.  ?  - SEVERE: Very SOB at rest, speaks in single words, struggling to breathe, sitting hunched forward, retractions, pulse > 120  ?    NO ?6. FEVER: "Do you have a fever?" If Yes, ask: "What is your temperature, how was it measured, and when did it start?" ?    Chills ?10. OTHER SYMPTOMS: "Do you have any other symptoms?" (e.g., runny nose, wheezing, chest pain) ?      Eyes and nose running, headache behind  eyes ? ?Protocols used: Cough - Acute Non-Productive-A-AH ? ?

## 2022-03-10 ENCOUNTER — Encounter: Payer: Self-pay | Admitting: Family Medicine

## 2022-03-10 ENCOUNTER — Ambulatory Visit (INDEPENDENT_AMBULATORY_CARE_PROVIDER_SITE_OTHER): Payer: 59 | Admitting: Family Medicine

## 2022-03-10 VITALS — BP 118/62 | HR 89 | Temp 98.1°F | Resp 18 | Wt 182.1 lb

## 2022-03-10 DIAGNOSIS — J069 Acute upper respiratory infection, unspecified: Secondary | ICD-10-CM

## 2022-03-10 MED ORDER — METHYLPREDNISOLONE 4 MG PO TBPK: ORAL_TABLET | ORAL | 0 refills | Status: DC

## 2022-03-10 NOTE — Progress Notes (Signed)
? ?  SUBJECTIVE:  ? ?CHIEF COMPLAINT / HPI:  ? ?UPPER RESPIRATORY TRACT INFECTION ?- symptom onset 3/24 ?- fully vaccinated against COVID including bivalent booster ?- tested for COVID Friday night, negative ? ?Fever: no ?Cough: yes, nonproductive ?Shortness of breath: no ?Chest pain: no ?Chest tightness: no ?Chest congestion: no ?Nasal congestion: yes ?Runny nose: no ?Sneezing: yes ?Sore throat:  a little ?Headache: yes ?Ear pain: no  ?Ear pressure: no  ?Eyes red/itching:yes ?Vomiting: no ?Rash: no ?Fatigue: yes ?Sick contacts: no ?Relief with OTC cold/cough medications: no  ?Treatments attempted: flonase, robatussin, theraflu, vitamin C ? ? ?OBJECTIVE:  ? ?BP 118/62   Pulse 89   Temp 98.1 ?F (36.7 ?C)   Resp 18   Wt 182 lb 1.6 oz (82.6 kg)   SpO2 100%   BMI 26.89 kg/m?   ?Gen: tired appearing, in NAD ?HEENT: orophyarynx clear without exudate, slightly erythematous. Uvula midline. Good dentition. TM visible b/l without bulging, erythema, purulence. ?Card: RRR ?Lungs: CTAB ?Ext: WWP ? ? ?ASSESSMENT/PLAN:  ? ?VIRAL URI ?Mild-mod sx. Will test for COVID given early test, would be a candidate for COVID antiviral treatment if positive. Reviewed OTC symptom relief, self-quarantine guidelines, and emergency precautions. Note provided for work.  ? ? ?Caro Laroche, DO ?

## 2022-03-10 NOTE — Patient Instructions (Signed)
You have a cold and it should start to get better about 7 - 10 days after it started.   ? ?Take the steroids as directed for 6 days. ? ?For your cough, try honey and lemon.  ? ?For your nasal congestion and runny nose, try using Afrin (generic is Oxymetazoline) twice daily for 3 days.  Do not use for longer that 3 days.   ? ?Some other therapies you can try are: push fluids, rest, use vaporizer or mist at night, and return office visit as needed if symptoms persist or worsen.  ? ?Drinking warm liquids such as teas and soups can help with secretions and cough. ?A mist humidifier or vaporizer can work well to help with secretions and cough.  It is very important to clean the humidifier between use according to the instructions.   ? ?It was good to see you.  If you're still having trouble in the next week, come back and see [place].   ? ?Of course, if you start having trouble breathing, worsening fevers, vomiting and unable to hold down any fluids, or you have other concerns, don't hesitate to come back or go to the ED after hours.  ? ?

## 2022-03-11 LAB — NOVEL CORONAVIRUS, NAA: SARS-CoV-2, NAA: NOT DETECTED

## 2022-03-18 ENCOUNTER — Other Ambulatory Visit: Payer: Self-pay

## 2022-03-18 MED ORDER — CARVEDILOL 12.5 MG PO TABS: 12.5000 mg | ORAL_TABLET | Freq: Two times a day (BID) | ORAL | 3 refills | Status: DC

## 2022-03-19 ENCOUNTER — Other Ambulatory Visit: Payer: Self-pay | Admitting: Gastroenterology

## 2022-03-23 ENCOUNTER — Ambulatory Visit: Payer: 59 | Admitting: Cardiology

## 2022-03-24 ENCOUNTER — Encounter: Payer: Self-pay | Admitting: Cardiology

## 2022-03-27 ENCOUNTER — Other Ambulatory Visit: Payer: Self-pay

## 2022-03-27 ENCOUNTER — Telehealth: Payer: Self-pay | Admitting: Cardiology

## 2022-03-27 MED ORDER — ENTRESTO 24-26 MG PO TABS: 1.0000 | ORAL_TABLET | Freq: Two times a day (BID) | ORAL | 0 refills | Status: DC

## 2022-03-27 NOTE — Telephone Encounter (Signed)
LMOV to reschedule  

## 2022-03-27 NOTE — Telephone Encounter (Signed)
-----   Message from Horton Finer sent at 03/27/2022 12:15 PM EDT ----- ?Regarding: appt ?Please reschedule F/U appointment. Patient did not show for last scheduled office visit. Thank you! ? ?

## 2022-05-01 ENCOUNTER — Encounter: Payer: Self-pay | Admitting: Family Medicine

## 2022-05-01 ENCOUNTER — Ambulatory Visit (INDEPENDENT_AMBULATORY_CARE_PROVIDER_SITE_OTHER): Payer: 59 | Admitting: Family Medicine

## 2022-05-01 VITALS — BP 122/76 | HR 85 | Temp 98.2°F | Resp 16 | Ht 69.0 in | Wt 180.7 lb

## 2022-05-01 DIAGNOSIS — Z0000 Encounter for general adult medical examination without abnormal findings: Secondary | ICD-10-CM

## 2022-05-01 DIAGNOSIS — R809 Proteinuria, unspecified: Secondary | ICD-10-CM

## 2022-05-01 DIAGNOSIS — Z1231 Encounter for screening mammogram for malignant neoplasm of breast: Secondary | ICD-10-CM | POA: Diagnosis not present

## 2022-05-01 DIAGNOSIS — E1129 Type 2 diabetes mellitus with other diabetic kidney complication: Secondary | ICD-10-CM

## 2022-05-01 DIAGNOSIS — Z79899 Other long term (current) drug therapy: Secondary | ICD-10-CM | POA: Diagnosis not present

## 2022-05-01 DIAGNOSIS — N912 Amenorrhea, unspecified: Secondary | ICD-10-CM

## 2022-05-01 NOTE — Progress Notes (Signed)
Name: Sarah Duffy   MRN: [id number]    DOB: Feb 10, 1971   Date:05/01/2022       Progress Note  Subjective  Chief Complaint  Chief Complaint  Patient presents with   Annual Exam    HPI  Patient presents for annual CPE.  Diet: she is eating  a low carb diet  Exercise: she is trying to walk more often    Flowsheet Row Video Visit from 06/24/2021 in [facility]  AUDIT-C Score 0      Depression: Phq 9 is  negative    05/01/2022    8:47 AM 03/10/2022    9:02 AM 01/07/2022    9:22 AM 01/05/2022   10:49 AM 10/03/2021    9:28 AM  Depression screen PHQ 2/9  Decreased Interest 0 0 0 0 0  Down, Depressed, Hopeless 0 0 0 0 0  PHQ - 2 Score 0 0 0 0 0  Altered sleeping 0 0 0 0 0  Tired, decreased energy 0 0 0 1 0  Change in appetite 0 0 0 0 0  Feeling bad or failure about yourself  0 0 0 0 0  Trouble concentrating 0 0 0 0 0  Moving slowly or fidgety/restless 0 0 0 0 0  Suicidal thoughts 0 0 0 0 0  PHQ-9 Score 0 0 0 1 0  Difficult doing work/chores  Not difficult at all      Hypertension: BP Readings from Last 3 Encounters:  05/01/22 122/76  03/10/22 118/62  01/07/22 130/68   Obesity: Wt Readings from Last 3 Encounters:  05/01/22 180 lb 11.2 oz (82 kg)  03/10/22 182 lb 1.6 oz (82.6 kg)  01/07/22 177 lb (80.3 kg)   BMI Readings from Last 3 Encounters:  05/01/22 26.68 kg/m  03/10/22 26.89 kg/m  01/07/22 26.14 kg/m     Vaccines:   Tdap: 2017  Shingrix: up to date  Pneumonia: PCV done 2017  Flu: up to date  COVID-19:had bivalent 2022   Hep C Screening: negative screen  STD testing and prevention (HIV/chl/gon/syphilis): Not interested  Intimate partner violence: negative screen  Sexual History :one sexual partner for the past two years, no pain or discomfort.  Menstrual History/LMP/Abnormal Bleeding: she is using condoms since she stopped using depo about 2 years ago. We will check [place] and LH  Discussed importance of follow up if any  post-menopausal bleeding: depending on [place] and LH she may be post-menopausal and discussed post menopausal bleeding   Incontinence Symptoms: negative for symptoms   Breast cancer:  - Last Mammogram: last one 2022 - BRCA gene screening:   Osteoporosis Prevention : Discussed high calcium and vitamin D supplementation, weight bearing exercises Bone density :not applicable   Cervical cancer screening:   Skin cancer: Discussed monitoring for atypical lesions  Colorectal cancer: up to date 05/22    Lung cancer:  Low Dose CT Chest recommended if Age 50-80 years, 20 pack-year currently smoking OR have quit w/in 15years. Patient does not qualify for screen   ECG: under the care of cardiologist , waiting to have open bypass surgery   Advanced Care Planning: A voluntary discussion about advance care planning including the explanation and discussion of advance directives.  Discussed health care proxy and Living will, and the patient was able to identify a health care proxy as son .  Patient does not have a living will and power of attorney of health care   Lipids: Lab Results  Component Value Date  CHOL 168 08/04/2021   CHOL 297 (H) 02/11/2021   CHOL 223 (H) 06/23/2019   Lab Results  Component Value Date   HDL 34 (L) 08/04/2021   HDL 48 (L) 02/11/2021   HDL 46 (L) 06/23/2019   Lab Results  Component Value Date   LDLCALC 115 (H) 08/04/2021   LDLCALC 229 (H) 02/11/2021   LDLCALC 152 (H) 06/23/2019   Lab Results  Component Value Date   TRIG 89 08/04/2021   TRIG 86 02/11/2021   TRIG 123 06/23/2019   Lab Results  Component Value Date   CHOLHDL 4.9 08/04/2021   CHOLHDL 6.2 (H) 02/11/2021   CHOLHDL 4.8 06/23/2019   No results found for: LDLDIRECT  Glucose: Glucose, Bld  Date Value Ref Range Status  09/17/2021 337 (H) 70 - 99 mg/dL Final    Comment:    Glucose reference range applies only to samples taken after fasting for at least 8 hours.  08/04/2021 248 (H) 65 - 99 mg/dL  Final    Comment:    .            Fasting reference interval . For someone without known diabetes, a glucose value >125 mg/dL indicates that they may have diabetes and this should be confirmed with a follow-up test. .   04/22/2021 147 (H) 70 - 99 mg/dL Final    Comment:    Glucose reference range applies only to samples taken after fasting for at least 8 hours.   Glucose-Capillary  Date Value Ref Range Status  04/22/2021 144 (H) 70 - 99 mg/dL Final    Comment:    Glucose reference range applies only to samples taken after fasting for at least 8 hours.  04/17/2021 267 (H) 70 - 99 mg/dL Final    Comment:    Glucose reference range applies only to samples taken after fasting for at least 8 hours.  04/14/2021 195 (H) 70 - 99 mg/dL Final    Comment:    Glucose reference range applies only to samples taken after fasting for at least 8 hours.    Patient Active Problem List   Diagnosis Date Noted   Angina pectoris associated with type 2 diabetes mellitus ([place]) 01/05/2022   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 edema associated with type 2 diabetes mellitus ([place]) 05/01/2021   Cardiomyopathy ([place]) 04/14/2021   Depression, major, recurrent, mild ([place]) 09/25/2019   History of 2019 novel coronavirus disease (COVID-19) 06/23/2019   Migraine headache without aura 01/14/2018   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([place]) 05/22/2017   Diabetic retinopathy of left eye, with macular edema, with severe nonproliferative retinopathy, associated with type 2 diabetes mellitus 05/21/2016   Severe nonproliferative diabetic retinopathy of right eye without macular edema associated with type 2 diabetes mellitus ([place]) 05/21/2016   Hyperlipidemia 03/10/2016   MI (mitral incompetence) 02/25/2016   Insomnia 02/25/2016   Anxiety 09/04/2014    Past Surgical History:  Procedure Laterality Date   CHOLECYSTECTOMY  1999   COLONOSCOPY WITH PROPOFOL N/A 03/25/2021   Procedure:  COLONOSCOPY WITH PROPOFOL;  Surgeon: Jonathon Bellows, MD;  Location: [facility] ENDOSCOPY;  Service: Gastroenterology;  Laterality: N/A;   COLONOSCOPY WITH PROPOFOL N/A 04/17/2021   Procedure: COLONOSCOPY WITH PROPOFOL;  Surgeon: Jonathon Bellows, MD;  Location: [facility] ENDOSCOPY;  Service: Gastroenterology;  Laterality: N/A;   ESOPHAGOGASTRODUODENOSCOPY (EGD) WITH PROPOFOL N/A 03/25/2021   Procedure: ESOPHAGOGASTRODUODENOSCOPY (EGD) WITH PROPOFOL;  Surgeon: Jonathon Bellows, MD;  Location: [facility] ENDOSCOPY;  Service: Gastroenterology;  Laterality: N/A;   ESOPHAGOGASTRODUODENOSCOPY (  EGD) WITH PROPOFOL N/A 04/17/2021   Procedure: ESOPHAGOGASTRODUODENOSCOPY (EGD) WITH PROPOFOL;  Surgeon: Jonathon Bellows, MD;  Location: [facility] ENDOSCOPY;  Service: Gastroenterology;  Laterality: N/A;   RIGHT/LEFT HEART CATH AND CORONARY ANGIOGRAPHY Bilateral 04/14/2021   Procedure: RIGHT/LEFT HEART CATH AND CORONARY ANGIOGRAPHY;  Surgeon: Nelva Bush, MD;  Location: [facility] LAB;  Service: Cardiovascular;  Laterality: Bilateral;   TOE SURGERY Left 02/07/2016   [place]    Family History  Problem Relation Age of Onset   Diabetes Mother    Ulcers Mother    Heart disease Father    AAA (abdominal aortic aneurysm) Father    Diabetes Father    Hypertension Father    Stroke Father    Alzheimer's disease Father    Heart attack Sister    Seizures Brother    Diabetes Maternal Grandmother    Breast cancer Maternal Grandmother     Social History   Socioeconomic History   Marital status: Single    Spouse name: Not on file   Number of children: 1   Years of education: Not on file   Highest education level: Some college, no degree  Occupational History   Occupation: Public affairs consultant   Tobacco Use   Smoking status: Never   Smokeless tobacco: Never  Vaping Use   Vaping Use: Never used  Substance and Sexual Activity   Alcohol use: No    Alcohol/week: 0.0 standard drinks   Drug use: No   Sexual activity: Yes    Partners: Male     Birth control/protection: Injection  Other Topics Concern   Not on file  Social History Narrative   She used to work at [employer], but  Feb 2017 she left work because of  MRSA infection.osteomyelitis and uncontrolled DM. She has been back to work since Feb 2018   Lives alone   Social Determinants of Health   Financial Resource Strain: Medium Risk   Difficulty of Paying Living Expenses: Somewhat hard  Food Insecurity: Landscape architect Present   Worried About Charity fundraiser in the Last Year: Often true   Arboriculturist in the Last Year: Often true  Transportation Needs: Public librarian (Medical): Yes   Lack of Transportation (Non-Medical): Yes  Physical Activity: Insufficiently Active   Days of Exercise per Week: 3 days   Minutes of Exercise per Session: 30 min  Stress: No Stress Concern Present   Feeling of Stress : Only a little  Social Connections: Socially Isolated   Frequency of Communication with Friends and Family: Once a week   Frequency of Social Gatherings with Friends and Family: Once a week   Attends Religious Services: More than 4 times per year   Active Member of [employer] or Organizations: No   Attends Music therapist: Never   Marital Status: Never married  Human resources officer Violence: Not At Risk   Fear of Current or Ex-Partner: No   Emotionally Abused: No   Physically Abused: No   Sexually Abused: No     Current Outpatient Medications:    albuterol (VENTOLIN HFA) 108 (90 Base) MCG/ACT inhaler, INHALE 2 PUFFS INTO THE LUNGS EVERY 6 HOURS AS NEEDED FOR WHEEZING OR SHORTNESS OF BREATH, Disp: 6.7 g, Rfl: 5   amLODipine (NORVASC) 5 MG tablet, Take 1 tablet (5 mg total) by mouth daily., Disp: 90 tablet, Rfl: 0   atorvastatin (LIPITOR) 80 MG tablet, Take 80 mg by mouth daily., Disp: ,  Rfl:    blood glucose meter kit and supplies, Dispense based on patient and insurance preference. Use up to four times daily as  directed. (FOR ICD-10 E10.9, E11.9)., Disp: 1 each, Rfl: 0   carvedilol (COREG) 12.5 MG tablet, Take 1 tablet (12.5 mg total) by mouth 2 (two) times daily., Disp: 60 tablet, Rfl: 3   JARDIANCE 25 MG TABS tablet, Take 25 mg by mouth daily., Disp: , Rfl:    LANTUS SOLOSTAR 100 UNIT/ML Solostar Pen, INJECT 18 UNITS UNDER THE SKIN EVERY MORNING AND 24 UNITS EVERY EVENING AT BEDTIME, Disp: 15 mL, Rfl: 2   liraglutide (VICTOZA) 18 MG/3ML SOPN, Inject 1.8 mg into the skin daily., Disp: , Rfl:    Multiple Vitamins-Minerals (MULTIVITAMIN WITH MINERALS) tablet, Take 1 tablet by mouth daily., Disp: 100 tablet, Rfl: 0   sacubitril-valsartan (ENTRESTO) 24-26 MG, Take 1 tablet by mouth 2 (two) times daily. PLEASE SCHEDULE FOLLOW UP APPT FOR FURTHER REFILLS. THANK YOU!, Disp: 60 tablet, Rfl: 0   spironolactone (ALDACTONE) 25 MG tablet, TAKE 1/2 TABLET(12.5 MG) BY MOUTH DAILY, Disp: 15 tablet, Rfl: 5   benzonatate (TESSALON) 100 MG capsule, Take 1 capsule (100 mg total) by mouth 2 (two) times daily as needed for cough. (Patient not taking: Reported on 05/01/2022), Disp: 40 capsule, Rfl: 0   Fluticasone-Umeclidin-Vilant (TRELEGY ELLIPTA) 100-62.5-25 MCG/ACT AEPB, Inhale 1 puff into the lungs daily. (Patient not taking: Reported on 05/01/2022), Disp: 1 each, Rfl: 11   furosemide (LASIX) 40 MG tablet, Take 1 tablet (40 mg total) by mouth daily., Disp: 30 tablet, Rfl: 3   methylPREDNISolone (MEDROL DOSEPAK) 4 MG TBPK tablet, Day 1: Take 8 mg (2 tablets) before breakfast, 4 mg (1 tablet) after lunch, 4 mg (1 tablet) after supper, and 8 mg (2 tablets) at bedtime. Day 2:Take 4 mg (1 tablet) before breakfast, 4 mg (1 tablet) after lunch, 4 mg (1 tablet) after supper, and 8 mg (2 tablets) at bedtime. Day 3: Take 4 mg (1 tablet) before breakfast, 4 mg (1 tablet) after lunch, 4 mg (1 tablet) after supper, and 4 mg (1 tablet) at bedtime. Day 4: Take 4 mg (1 tablet) before breakfast, 4 mg (1 tablet) after lunch, and 4 mg (1 tablet) at  bedtime. Day 5: Take 4 mg (1 tablet) before breakfast and 4 mg (1 tablet) at bedtime. Day 6: Take 4 mg (1 tablet) before breakfast. (Patient not taking: Reported on 05/01/2022), Disp: 21 tablet, Rfl: 0   nitroGLYCERIN (NITROSTAT) 0.4 MG SL tablet, Place 1 tablet (0.4 mg total) under the tongue every 5 (five) minutes as needed for chest pain., Disp: 25 tablet, Rfl: prn   omeprazole (PRILOSEC) 40 MG capsule, Take 1 capsule (40 mg total) by mouth daily. (Patient not taking: Reported on 05/01/2022), Disp: 90 capsule, Rfl: 3   TRULANCE 3 MG TABS, Take 1 tablet by mouth daily. (Patient not taking: Reported on 05/01/2022), Disp: , Rfl:    valACYclovir (VALTREX) 1000 MG tablet, TAKE 1 TABLET(1000 MG) BY MOUTH TWICE DAILY (Patient not taking: Reported on 05/01/2022), Disp: 10 tablet, Rfl: 1  No Known Allergies   ROS  Constitutional: Negative for fever or weight change.  Respiratory: Negative for cough and shortness of breath.   Cardiovascular: Negative for chest pain or palpitations.  Gastrointestinal: Negative for abdominal pain, no bowel changes.  Musculoskeletal: Negative for gait problem or joint swelling.  Skin: Negative for rash.  Neurological: Negative for dizziness or headache.  No other specific complaints in a complete review  of systems (except as listed in HPI above).   Objective  Vitals:   05/01/22 0844  BP: 122/76  Pulse: 85  Resp: 16  Temp: 98.2 F (36.8 C)  TempSrc: Oral  SpO2: 99%  Weight: 180 lb 11.2 oz (82 kg)  Height: _0  (1.753 m)    Body mass index is 26.68 kg/m.  Physical Exam  Constitutional: Patient appears well-developed and well-nourished. No distress.  HENT: Head: Normocephalic and atraumatic. Ears: B TMs ok, no erythema or effusion; Nose: Nose normal. Mouth/Throat: Oropharynx is clear and moist. No oropharyngeal exudate.  Eyes: Conjunctivae and EOM are normal. Pupils are equal, round, and reactive to light. No scleral icterus.  Neck: Normal range of motion.  Neck supple. No JVD present. No thyromegaly present.  Cardiovascular: Normal rate, regular rhythm and normal heart sounds.  No murmur heard. No BLE edema. Pulmonary/Chest: Effort normal and breath sounds normal. No respiratory distress. Abdominal: Soft. Bowel sounds are normal, no distension. There is no tenderness. no masses Breast: no lumps or masses, no nipple discharge or rashes FEMALE GENITALIA:  Not done  RECTAL: not done  Musculoskeletal: Normal range of motion, no joint effusions. No gross deformities Neurological: he is alert and oriented to person, place, and time. No cranial nerve deficit. Coordination, balance, strength, speech and gait are normal.  Skin: Skin is warm and dry. No rash noted. No erythema.  Psychiatric: Patient has a normal mood and affect. behavior is normal. Judgment and thought content normal.   Recent Results (from the past 2160 hour(s))  Novel Coronavirus, NAA ([facility])     Status: None   Collection Time: 03/10/22 12:00 AM   Specimen: Nasopharyngeal(NP) swabs in vial transport medium   Nasopharynge  Previous  Result Value Ref Range   SARS-CoV-2, NAA Not Detected Not Detected    Comment: This nucleic acid amplification test was developed and its performance characteristics determined by [employer]. Nucleic acid amplification tests include RT-PCR and TMA. This test has not been FDA cleared or approved. This test has been authorized by FDA under an Emergency Use Authorization (EUA). This test is only authorized for the duration of time the declaration that circumstances exist justifying the authorization of the emergency use of in vitro diagnostic tests for detection of SARS-CoV-2 virus and/or diagnosis of COVID-19 infection under section 564(b)(1) of the Act, 21 U.S.C. 277AJO-8(N) (1), unless the authorization is terminated or revoked sooner. When diagnostic testing is negative, the possibility of a false negative result should be considered in  the context of a patient's recent exposures and the presence of clinical signs and symptoms consistent with COVID-19. An individual without symptoms of COVID-19 and who is not shedding SARS-CoV-2 virus wo uld expect to have a negative (not detected) result in this assay.      Fall Risk:    05/01/2022    8:39 AM 03/10/2022    9:02 AM 01/07/2022    9:21 AM 01/05/2022   10:49 AM 10/03/2021    9:28 AM  Fall Risk   Falls in the past year? 0 0 0 0 0  Number falls in past yr:  0 0 0 0  Injury with Fall?  0 0 0 0  Risk for fall due to : No Fall Risks  No Fall Risks No Fall Risks No Fall Risks  Follow up Falls prevention discussed;Education provided;Falls evaluation completed  Falls prevention discussed Falls prevention discussed Falls prevention discussed     Functional Status Survey: Is the patient deaf or  have difficulty hearing?: No Does the patient have difficulty seeing, even when wearing glasses/contacts?: No Does the patient have difficulty concentrating, remembering, or making decisions?: No Does the patient have difficulty walking or climbing stairs?: No Does the patient have difficulty dressing or bathing?: No Does the patient have difficulty doing errands alone such as visiting a doctor's office or shopping?: No   Assessment & Plan  1. 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(HCC)  -Lipid panel - Microalbumin / creatinine urine ratio - Hemoglobin A1c  2. Long-term use of high-risk medication  - CBC with Differential/Platelet - COMPLETE METABOLIC PANEL WITH GFR  3. Well adult health check  - Lipid panel - Microalbumin / creatinine urine ratio - CBC with Differential/Platelet - COMPLETE METABOLIC PANEL WITH GFR - Hemoglobin A1c  4. Breast cancer screening by mammogram  - MM 3D SCREEN BREAST BILATERAL; Future  5. Amenorrhea, unspecified  - FSH/LH   -USPSTF grade A and B recommendations reviewed with patient; age-appropriate recommendations, preventive care,  screening tests, etc discussed and encouraged; healthy living encouraged; see AVS for patient education given to patient -Discussed importance of 150 minutes of physical activity weekly, eat two servings of fish weekly, eat one serving of tree nuts ( cashews, pistachios, pecans, almonds.Marland Kitchen) every other day, eat 6 servings of fruit/vegetables daily and drink plenty of water and avoid sweet beverages.   -Reviewed Health Maintenance: Yes.

## 2022-05-02 LAB — COMPLETE METABOLIC PANEL WITHOUT GFR
AG Ratio: 1.5 (calc) (ref 1.0–2.5)
ALT: 19 U/L (ref 6–29)
AST: 18 U/L (ref 10–35)
Albumin: 4.3 g/dL (ref 3.6–5.1)
Alkaline phosphatase (APISO): 82 U/L (ref 37–153)
BUN/Creatinine Ratio: 17 (calc) (ref 6–22)
BUN: 18 mg/dL (ref 7–25)
CO2: 25 mmol/L (ref 20–32)
Calcium: 9.1 mg/dL (ref 8.6–10.4)
Chloride: 106 mmol/L (ref 98–110)
Creat: 1.05 mg/dL — ABNORMAL HIGH (ref 0.50–1.03)
Globulin: 2.9 g/dL (ref 1.9–3.7)
Glucose, Bld: 245 mg/dL — ABNORMAL HIGH (ref 65–99)
Potassium: 4.5 mmol/L (ref 3.5–5.3)
Sodium: 140 mmol/L (ref 135–146)
Total Bilirubin: 0.9 mg/dL (ref 0.2–1.2)
Total Protein: 7.2 g/dL (ref 6.1–8.1)
eGFR: 64 mL/min/1.73m2 (ref >=60)

## 2022-05-02 LAB — FSH/LH
FSH: 110 m[IU]/mL
LH: 59 m[IU]/mL

## 2022-05-02 LAB — CBC WITH DIFFERENTIAL/PLATELET
Absolute Monocytes: 462 {cells}/uL (ref 200–950)
Basophils Absolute: 41 {cells}/uL (ref 0–200)
Basophils Relative: 0.6 %
Eosinophils Absolute: 150 {cells}/uL (ref 15–500)
Eosinophils Relative: 2.2 %
HCT: 32.1 % — ABNORMAL LOW (ref 35.0–45.0)
Hemoglobin: 10.5 g/dL — ABNORMAL LOW (ref 11.7–15.5)
Lymphs Abs: 1918 {cells}/uL (ref 850–3900)
MCH: 29 pg (ref 27.0–33.0)
MCHC: 32.7 g/dL (ref 32.0–36.0)
MCV: 88.7 fL (ref 80.0–100.0)
MPV: 9.8 fL (ref 7.5–12.5)
Monocytes Relative: 6.8 %
Neutro Abs: 4230 {cells}/uL (ref 1500–7800)
Neutrophils Relative %: 62.2 %
Platelets: 235 Thousand/uL (ref 140–400)
RBC: 3.62 Million/uL — ABNORMAL LOW (ref 3.80–5.10)
RDW: 12.1 % (ref 11.0–15.0)
Total Lymphocyte: 28.2 %
WBC: 6.8 Thousand/uL (ref 3.8–10.8)

## 2022-05-02 LAB — LIPID PANEL
Cholesterol: 152 mg/dL (ref <200)
HDL: 38 mg/dL — ABNORMAL LOW (ref >=50)
LDL Cholesterol (Calc): 95 mg/dL (calc)
Non-HDL Cholesterol (Calc): 114 mg/dL (calc) (ref <130)
Total CHOL/HDL Ratio: 4 (calc) (ref <5.0)
Triglycerides: 99 mg/dL (ref <150)

## 2022-05-02 LAB — MICROALBUMIN / CREATININE URINE RATIO
Creatinine, Urine: 43 mg/dL (ref 20–275)
Microalb Creat Ratio: 258 ug/mg{creat} — ABNORMAL HIGH (ref <30)
Microalb, Ur: 11.1 mg/dL

## 2022-05-02 LAB — HEMOGLOBIN A1C
Hgb A1c MFr Bld: 8.1 % of total Hgb — ABNORMAL HIGH (ref <5.7)
Mean Plasma Glucose: 186 mg/dL
eAG (mmol/L): 10.3 mmol/L

## 2022-05-05 NOTE — Progress Notes (Unsigned)
Name: Sarah Duffy   MRN: [id number]    DOB: Feb 10, 1971   Date:05/06/2022       Progress Note  Subjective  Chief Complaint  Follow Up  HPI  DMII: history of left foot ulcer and osteomyelitis, microalbuminuria, hypertension, retinopathy and dyslipidemia.  She is currently taking generic Lantus 18 units in am and 24 units at night and Lantus at the same dose plus  Jardiance 78m , she is not on Victoza. Explained the largine insulin. For now take 20 unit in am and 40 units at night and get Victoza filled. Titrate am dose dose once glucose gets under better control A1C has improved down to 8.1 % , she is [facility] She works at [employer] trying to follow diabetic diet in anticipation to open heart surgery.   MDD: she had a gap in her insurance coverage and has been without medications for months. She lost her mother unexpectedly on Feb 4 th , 20.  she does not want to resume medications at this time   HTN: bp is at goal, she lost to follow up with cardiologist we called his office to schedule a visit but they said she has to contact them directly   CAD/Chronic systolic heart failure/Cardiomyopathy  : under the care of Dr. AMylo Red [facility] , she will has a follow up with cardiovascular surgeon Dr. [place]yndia Bentin [place] , she was ready to have CABG on 09/19/2021 but it was cancelled by the physician due to personal problems. She denies any recent chest pain, taking medication daily . She has ischemic cardiomyopathy but currently no symptoms. Denies orthopnea, PND or lower extremity edema , explained importance of following up with cardiologist ASAP and schedule surgery She is taking statin and we will add Zetia today to get LDL to goal   Conclusions Cath :   Multivessel coronary artery disease including mild to moderate diffuse plaquing of the LAD and RCA.  The mid/distal LAD  demonstrates 3 focal lesions of up to 80% that are hemodynamically significant by iFR (iFR = 0.71).  There is also a 99% stenosis of OM2 with faint left to left collaterals supplying the distal branch as well as sequential 60% distal RCA and 70-80% proximal RPDA lesions.  Asthma intermittent and AR: she would like medications to take prn   Patient Active Problem List   Diagnosis Date Noted   Angina pectoris associated with type 2 diabetes mellitus ([place] 01/05/2022   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 edema associated with type 2 diabetes mellitus ([place] 05/01/2021   Cardiomyopathy ([place] 04/14/2021   Depression, major, recurrent, mild ([place] 09/25/2019   History of 2019 novel coronavirus disease (COVID-19) 06/23/2019   Migraine headache without aura 01/14/2018   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([place] 05/22/2017   Diabetic retinopathy of left eye, with macular edema, with severe nonproliferative retinopathy, associated with type 2 diabetes mellitus 05/21/2016   Severe nonproliferative diabetic retinopathy of right eye without macular edema associated with type 2 diabetes mellitus ([place] 05/21/2016   Hyperlipidemia 03/10/2016   MI (mitral incompetence) 02/25/2016   Insomnia 02/25/2016   Anxiety 09/04/2014    Past Surgical History:  Procedure Laterality Date   CHOLECYSTECTOMY  1999   COLONOSCOPY WITH PROPOFOL N/A 03/25/2021   Procedure: COLONOSCOPY WITH PROPOFOL;  Surgeon: AJonathon Bellows MD;  [facility];  Service: Gastroenterology;  Laterality: N/A;  COLONOSCOPY WITH PROPOFOL N/A 04/17/2021   Procedure: COLONOSCOPY WITH PROPOFOL;  Surgeon: Jonathon Bellows, MD;  [facility] END[facility]COPY;  Service: Gastroenterology;  Laterality: N/A;   ESOPHAGOGASTRODUODENOSCOPY (EGD) WITH PROPOFOL N/A 03/25/2021   Procedure: ESOPHAGOGASTRODUODENOSCOPY (EGD) WITH PROPOFOL;  Surgeon: Jonathon Bellows, MD;  Location[facility] ENDOSCOPY;  Service: Gastroenterology;  Laterality: N/A;    ESOPHAGOGASTRODUODENOSCOPY (EGD) WITH PROPOFOL N/A 04/17/2021   Procedure: ESOPHAGOGASTRODUODENOSCOPY (EGD) WITH PROPOFOL;  Surgeon: Jonathon Bellows, MD;  Location[facility]COPY;  Service: Gastroenterology;  Laterality: N/A;   RIGHT/LEFT HEART CATH AND CORONARY ANGIOGRAPHY Bilateral 04/14/2021   Procedure: RIGHT/LEFT HEART CATH AND CORONARY ANGIOGRAPHY;  Surgeon: Nelva Bush, MD;  Location: [place] CV LAB;  Service: Cardiovascular;  Laterality: Bilateral;   TOE SURGERY Left 02/07/2016   Pinky Toe    Family History  Problem Relation Age of Onset   Diabetes Mother    Ulcers Mother    Heart disease Father    AAA (abdominal aortic aneurysm) Father    Diabetes Father    Hypertension Father    Stroke Father    Alzheimer's disease Father    Heart attack Sister    Seizures Brother    Diabetes Maternal Grandmother    Breast cancer Maternal Grandmother     Social History   Tobacco Use   Smoking status: Never   Smokeless tobacco: Never  Substance Use Topics   Alcohol use: No    Alcohol/week: 0.0 standard drinks     Current Outpatient Medications:    blood glucose meter kit and supplies, Dispense based on patient and insurance preference. Use up to four times daily as directed. (FOR ICD-10 E10.9, E11.9)., Disp: 1 each, Rfl: 0   carvedilol (COREG) 12.5 MG tablet, Take 1 tablet (12.5 mg total) by mouth 2 (two) times daily., Disp: 60 tablet, Rfl: 3   ezetimibe (ZETIA) 10 MG tablet, Take 1 tablet (10 mg total) by mouth daily., Disp: 90 tablet, Rfl: 1   fluticasone (FLONASE) 50 MCG/ACT nasal spray, Place 2 sprays into both nostrils daily., Disp: 16 g, Rfl: 6   insulin glargine-yfgn (SEMGLEE) 100 UNIT/ML Pen, Inject 30-40 Units into the skin daily., Disp: 30 mL, Rfl: 2   levocetirizine (XYZAL) 5 MG tablet, Take 1 tablet (5 mg total) by mouth every evening., Disp: 90 tablet, Rfl: 1   montelukast (SINGULAIR) 10 MG tablet, Take 1 tablet (10 mg total) by mouth at bedtime., Disp: 90 tablet,  Rfl: 1   Multiple Vitamins-Minerals (MULTIVITAMIN WITH MINERALS) tablet, Take 1 tablet by mouth daily., Disp: 100 tablet, Rfl: 0   spironolactone (ALDACTONE) 25 MG tablet, TAKE 1/2 TABLET(12.5 MG) BY MOUTH DAILY, Disp: 15 tablet, Rfl: 5   albuterol (VENTOLIN HFA) 108 (90 Base) MCG/ACT inhaler, INHALE 2 PUFFS INTO THE LUNGS EVERY 6 HOURS AS NEEDED FOR WHEEZING OR SHORTNESS OF BREATH (Patient not taking: Reported on 05/06/2022), Disp: 6.7 g, Rfl: 5   amLODipine (NORVASC) 5 MG tablet, Take 1 tablet (5 mg total) by mouth daily., Disp: 90 tablet, Rfl: 1   atorvastatin (LIPITOR) 80 MG tablet, Take 1 tablet (80 mg total) by mouth daily., Disp: 90 tablet, Rfl: 1   JARDIANCE 25 MG TABS tablet, Take 1 tablet (25 mg total) by mouth daily., Disp: 90 tablet, Rfl: 0   liraglutide (VICTOZA) 18 MG/3ML SOPN, Inject 1.8 mg into the skin daily., Disp: 15 mL, Rfl: 1   nitroGLYCERIN (NITROSTAT) 0.4 MG SL tablet, Place 1 tablet (0.4 mg total) under the tongue every 5 (five) minutes as needed for  chest pain., Disp: 25 tablet, Rfl: prn   sacubitril-valsartan (ENTRESTO) 24-26 MG, Take 1 tablet by mouth 2 (two) times daily., Disp: 60 tablet, Rfl: 0  No Known Allergies  I personally reviewed active problem list, medication list, allergies, family history, social history, health maintenance with the patient/caregiver today.   ROS  Constitutional: Negative for fever or weight change.  Respiratory: Negative for cough and shortness of breath.   Cardiovascular: Negative for chest pain or palpitations.  Gastrointestinal: Negative for abdominal pain, no bowel changes.  Musculoskeletal: Negative for gait problem or joint swelling.  Skin: Negative for rash.  Neurological: Negative for dizziness or headache.  No other specific complaints in a complete review of systems (except as listed in HPI above).   Objective  Vitals:   05/06/22 1003  BP: 132/66  Pulse: 83  Resp: 16  SpO2: 99%  Weight: 180 lb (81.6 kg)  Height: 5'  9" (1.753 m)    Body mass index is 26.58 kg/m.  Physical Exam  Constitutional: Patient appears well-developed and well-nourished.  No distress.  HEENT: head atraumatic, normocephalic, pupils equal and reactive to light, neck supple Cardiovascular: Normal rate, regular rhythm and normal heart sounds.  No murmur heard. No BLE edema. Pulmonary/Chest: Effort normal and breath sounds normal. No respiratory distress. Abdominal: Soft.  There is no tenderness. Psychiatric: Patient has a normal mood and affect. behavior is normal. Judgment and thought content normal.   Recent Results (from the past 2160 hour(s))  Novel Coronavirus, NAA ([facility])     Status: None   Collection Time: 03/10/22 12:00 AM   Specimen: Nasopharyngeal(NP) swabs in vial transport medium   Nasopharynge  Previous  Result Value Ref Range   SARS-CoV-2, NAA Not Detected Not Detected    Comment: This nucleic acid amplification test was developed and its performance characteristics determined by [employer]. Nucleic acid amplification tests include RT-PCR and TMA. This test has not been FDA cleared or approved. This test has been authorized by FDA under an Emergency Use Authorization (EUA). This test is only authorized for the duration of time the declaration that circumstances exist justifying the authorization of the emergency use of in vitro diagnostic tests for detection of SARS-CoV-2 virus and/or diagnosis of COVID-19 infection under section 564(b)(1) of the Act, 21 U.S.C. 335KTG-2(B) (1), unless the authorization is terminated or revoked sooner. When diagnostic testing is negative, the possibility of a false negative result should be considered in the context of a patient's recent exposures and the presence of clinical signs and symptoms consistent with COVID-19. An individual without symptoms of COVID-19 and who is not shedding SARS-CoV-2 virus wo uld expect to have a negative (not detected) result in this  assay.   Lipid panel     Status: Abnormal   Collection Time: 05/01/22  9:48 AM  Result Value Ref Range   Cholesterol 152 <200 mg/dL   HDL 38 (L) > OR = 50 mg/dL   Triglycerides 99 <150 mg/dL   LDL Cholesterol (Calc) 95 mg/dL (calc)    Comment: Reference range: <100 . Desirable range <100 mg/dL for primary prevention;   <70 mg/dL for patients with CHD or diabetic patients  with > or = 2 CHD risk factors. Marland Kitchen LDL-C is now calculated using the Martin-Hopkins  calculation, which is a validated novel method providing  better accuracy than the Friedewald equation in the  estimation of LDL-C.  Cresenciano Genre et al. Annamaria Helling. 6389;373(42): 2061-2068  (http://education.[employer].com/faq/FAQ164)    Total CHOL/HDL Ratio 4.0 <5.0 (  calc)   Non-HDL Cholesterol (Calc) 114 <130 mg/dL (calc)    Comment: For patients with diabetes plus 1 major ASCVD risk  factor, treating to a non-HDL-C goal of <100 mg/dL  (LDL-C of <70 mg/dL) is considered a therapeutic  option.   Microalbumin / creatinine urine ratio     Status: Abnormal   Collection Time: 05/01/22  9:48 AM  Result Value Ref Range   Creatinine, Urine 43 20 - 275 mg/dL   Microalb, Ur 11.1 mg/dL    Comment: Reference Range Not established    Microalb Creat Ratio 258 (H) <30 mcg/mg creat    Comment: . The ADA defines abnormalities in albumin excretion as follows: Marland Kitchen Albuminuria Category        Result (mcg/mg creatinine) . Normal to Mildly increased   <30 Moderately increased         30-299  Severely increased           > OR = 300 . The ADA recommends that at least two of three specimens collected within a 3-6 month period be abnormal before considering a patient to be within a diagnostic category.   CBC with Differential/Platelet     Status: Abnormal   Collection Time: 05/01/22  9:48 AM  Result Value Ref Range   WBC 6.8 3.8 - 10.8 Thousand/uL   RBC 3.62 (L) 3.80 - 5.10 Million/uL   Hemoglobin 10.5 (L) 11.7 - 15.5 g/dL   HCT 32.1  (L) 35.0 - 45.0 %   MCV 88.7 80.0 - 100.0 fL   MCH 29.0 27.0 - 33.0 pg   MCHC 32.7 32.0 - 36.0 g/dL   RDW 12.1 11.0 - 15.0 %   Platelets 235 140 - 400 Thousand/uL   MPV 9.8 7.5 - 12.5 fL   Neutro Abs 4,230 1,500 - 7,800 cells/uL   Lymphs Abs 1,918 850 - 3,900 cells/uL   Absolute Monocytes 462 200 - 950 cells/uL   Eosinophils Absolute 150 15 - 500 cells/uL   Basophils Absolute 41 0 - 200 cells/uL   Neutrophils Relative % 62.2 %   Total Lymphocyte 28.2 %   Monocytes Relative 6.8 %   Eosinophils Relative 2.2 %   Basophils Relative 0.6 %  COMPLETE METABOLIC PANEL WITH GFR     Status: Abnormal   Collection Time: 05/01/22  9:48 AM  Result Value Ref Range   Glucose, Bld 245 (H) 65 - 99 mg/dL    Comment: .            Fasting reference interval . For someone without known diabetes, a glucose value >125 mg/dL indicates that they may have diabetes and this should be confirmed with a follow-up test. .    BUN 18 7 - 25 mg/dL   Creat 1.05 (H) 0.50 - 1.03 mg/dL   eGFR 64 > OR = 60 mL/min/1.69m    Comment: The eGFR is based on the CKD-EPI 2021 equation. To calculate  the new eGFR from a previous Creatinine or Cystatin C result, go to https://www.kidney.org/professionals/ kdoqi/gfr%5Fcalculator    BUN/Creatinine Ratio 17 6 - 22 (calc)   Sodium 140 135 - 146 mmol/L   Potassium 4.5 3.5 - 5.3 mmol/L   Chloride 106 98 - 110 mmol/L   CO2 25 20 - 32 mmol/L   Calcium 9.1 8.6 - 10.4 mg/dL   Total Protein 7.2 6.1 - 8.1 g/dL   Albumin 4.3 3.6 - 5.1 g/dL   Globulin 2.9 1.9 - 3.7 g/dL (calc)   AG Ratio 1.5  1.0 - 2.5 (calc)   Total Bilirubin 0.9 0.2 - 1.2 mg/dL   Alkaline phosphatase (APISO) 82 37 - 153 U/L   AST 18 10 - 35 U/L   ALT 19 6 - 29 U/L  Hemoglobin A1c     Status: Abnormal   Collection Time: 05/01/22  9:48 AM  Result Value Ref Range   Hgb A1c MFr Bld 8.1 (H) <5.7 % of total Hgb    Comment: For someone without known diabetes, a hemoglobin A1c value of 6.5% or greater indicates  that they may have  diabetes and this should be confirmed with a follow-up  test. . For someone with known diabetes, a value <7% indicates  that their diabetes is well controlled and a value  greater than or equal to 7% indicates suboptimal  control. A1c targets should be individualized based on  duration of diabetes, age, comorbid conditions, and  other considerations. . Currently, no consensus exists regarding use of hemoglobin A1c for diagnosis of diabetes for children. .    Mean Plasma Glucose 186 mg/dL   eAG (mmol/L) 10.3 mmol/L  FSH/LH     Status: None   Collection Time: 05/01/22  9:48 AM  Result Value Ref Range   FSH 110.0 mIU/mL    Comment:                     Reference Range .              Follicular Phase       3.2-67.1              Mid-cycle Peak         3.1-17.7              Luteal Phase           1.5- 9.1              Postmenopausal       23.0-116.3              .    LH 59.0 mIU/mL    Comment:     Reference Range Follicular Phase  2.4-58.0 Mid-Cycle Peak    8.7-76.3 Luteal Phase      0.5-16.9 Postmenopausal    10.0-54.7     Diabetic Foot Exam: Diabetic Foot Exam - Simple   Simple Foot Form Visual Inspection See comments: Yes Sensation Testing Intact to touch and monofilament testing bilaterally: Yes Pulse Check Posterior Tibialis and Dorsalis pulse intact bilaterally: Yes Comments Maceration of toe webs      PHQ2/9:    05/06/2022   10:03 AM 05/01/2022    8:47 AM 03/10/2022    9:02 AM 01/07/2022    9:22 AM 01/05/2022   10:49 AM  Depression screen PHQ 2/9  Decreased Interest 0 0 0 0 0  Down, Depressed, Hopeless 0 0 0 0 0  PHQ - 2 Score 0 0 0 0 0  Altered sleeping 0 0 0 0 0  Tired, decreased energy 0 0 0 0 1  Change in appetite 0 0 0 0 0  Feeling bad or failure about yourself  0 0 0 0 0  Trouble concentrating 0 0 0 0 0  Moving slowly or fidgety/restless 0 0 0 0 0  Suicidal thoughts 0 0 0 0 0  PHQ-9 Score 0 0 0 0 1  Difficult doing  work/chores   Not difficult at all      phq 9 is negative  Fall Risk:    05/06/2022   10:03 AM 05/01/2022    8:39 AM 03/10/2022    9:02 AM 01/07/2022    9:21 AM 01/05/2022   10:49 AM  Fall Risk   Falls in the past year? 0 0 0 0 0  Number falls in past yr: 0  0 0 0  Injury with Fall? 0  0 0 0  Risk for fall due to : No Fall Risks No Fall Risks  No Fall Risks No Fall Risks  Follow up Falls prevention discussed Falls prevention discussed;Education provided;Falls evaluation completed  Falls prevention discussed Falls prevention discussed      Functional Status Survey: Is the patient deaf or have difficulty hearing?: No Does the patient have difficulty seeing, even when wearing glasses/contacts?: No Does the patient have difficulty concentrating, remembering, or making decisions?: No Does the patient have difficulty walking or climbing stairs?: No Does the patient have difficulty dressing or bathing?: No Does the patient have difficulty doing errands alone such as visiting a doctor's office or shopping?: No    Assessment & Plan  1. 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(HCC)  - insulin glargine-yfgn (SEMGLEE) 100 UNIT/ML Pen; Inject 30-40 Units into the skin daily.  Dispense: 30 mL; Refill: 2 - JARDIANCE 25 MG TABS tablet; Take 1 tablet (25 mg total) by mouth daily.  Dispense: 90 tablet; Refill: 0 - liraglutide (VICTOZA) 18 MG/3ML SOPN; Inject 1.8 mg into the skin daily.  Dispense: 15 mL; Refill: 1  2. Hypertension associated with diabetes ([place])  - amLODipine (NORVASC) 5 MG tablet; Take 1 tablet (5 mg total) by mouth daily.  Dispense: 90 tablet; Refill: 1  3. Chronic systolic heart failure (HCC)  - sacubitril-valsartan (ENTRESTO) 24-26 MG; Take 1 tablet by mouth 2 (two) times daily.  Dispense: 60 tablet; Refill: 0  4. Angina pectoris associated with type 2 diabetes mellitus ([place])   5. Coronary artery disease due to lipid rich plaque  - atorvastatin (LIPITOR) 80 MG  tablet; Take 1 tablet (80 mg total) by mouth daily.  Dispense: 90 tablet; Refill: 1 - ezetimibe (ZETIA) 10 MG tablet; Take 1 tablet (10 mg total) by mouth daily.  Dispense: 90 tablet; Refill: 1  6. Ischemic cardiomyopathy   7. Depression, major, in remission ([place])   8. Dyslipidemia due to type 2 diabetes mellitus (HCC)  - atorvastatin (LIPITOR) 80 MG tablet; Take 1 tablet (80 mg total) by mouth daily.  Dispense: 90 tablet; Refill: 1 - ezetimibe (ZETIA) 10 MG tablet; Take 1 tablet (10 mg total) by mouth daily.  Dispense: 90 tablet; Refill: 1  9. Asthma, mild intermittent, well-controlled  - montelukast (SINGULAIR) 10 MG tablet; Take 1 tablet (10 mg total) by mouth at bedtime.  Dispense: 90 tablet; Refill: 1  10. Perennial allergic rhinitis with seasonal variation  - montelukast (SINGULAIR) 10 MG tablet; Take 1 tablet (10 mg total) by mouth at bedtime.  Dispense: 90 tablet; Refill: 1 - levocetirizine (XYZAL) 5 MG tablet; Take 1 tablet (5 mg total) by mouth every evening.  Dispense: 90 tablet; Refill: 1 - fluticasone (FLONASE) 50 MCG/ACT nasal spray; Place 2 sprays into both nostrils daily.  Dispense: 16 g; Refill: 6 .

## 2022-05-06 ENCOUNTER — Encounter: Payer: Self-pay | Admitting: Family Medicine

## 2022-05-06 ENCOUNTER — Ambulatory Visit (INDEPENDENT_AMBULATORY_CARE_PROVIDER_SITE_OTHER): Payer: 59 | Admitting: Family Medicine

## 2022-05-06 VITALS — BP 132/66 | HR 83 | Resp 16 | Ht 69.0 in | Wt 180.0 lb

## 2022-05-06 DIAGNOSIS — I2583 Coronary atherosclerosis due to lipid rich plaque: Secondary | ICD-10-CM

## 2022-05-06 DIAGNOSIS — J3089 Other allergic rhinitis: Secondary | ICD-10-CM

## 2022-05-06 DIAGNOSIS — J302 Other seasonal allergic rhinitis: Secondary | ICD-10-CM

## 2022-05-06 DIAGNOSIS — F325 Major depressive disorder, single episode, in full remission: Secondary | ICD-10-CM

## 2022-05-06 DIAGNOSIS — E785 Hyperlipidemia, unspecified: Secondary | ICD-10-CM

## 2022-05-06 DIAGNOSIS — I209 Angina pectoris, unspecified: Secondary | ICD-10-CM

## 2022-05-06 DIAGNOSIS — I5022 Chronic systolic (congestive) heart failure: Secondary | ICD-10-CM

## 2022-05-06 DIAGNOSIS — I2510 Atherosclerotic heart disease of native coronary artery without angina pectoris: Secondary | ICD-10-CM

## 2022-05-06 DIAGNOSIS — E1129 Type 2 diabetes mellitus with other diabetic kidney complication: Secondary | ICD-10-CM

## 2022-05-06 DIAGNOSIS — R809 Proteinuria, unspecified: Secondary | ICD-10-CM

## 2022-05-06 DIAGNOSIS — J4520 Mild intermittent asthma, uncomplicated: Secondary | ICD-10-CM

## 2022-05-06 DIAGNOSIS — I152 Hypertension secondary to endocrine disorders: Secondary | ICD-10-CM

## 2022-05-06 DIAGNOSIS — I255 Ischemic cardiomyopathy: Secondary | ICD-10-CM

## 2022-05-06 DIAGNOSIS — E1169 Type 2 diabetes mellitus with other specified complication: Secondary | ICD-10-CM

## 2022-05-06 DIAGNOSIS — E1159 Type 2 diabetes mellitus with other circulatory complications: Secondary | ICD-10-CM

## 2022-05-06 MED ORDER — JARDIANCE 25 MG PO TABS: 25.0000 mg | ORAL_TABLET | Freq: Every day | ORAL | 0 refills | Status: DC

## 2022-05-06 MED ORDER — EZETIMIBE 10 MG PO TABS: 10.0000 mg | ORAL_TABLET | Freq: Every day | ORAL | 1 refills | Status: DC

## 2022-05-06 MED ORDER — FLUTICASONE PROPIONATE 50 MCG/ACT NA SUSP: 2.0000 | Freq: Every day | NASAL | 6 refills | Status: DC

## 2022-05-06 MED ORDER — LEVOCETIRIZINE DIHYDROCHLORIDE 5 MG PO TABS: 5.0000 mg | ORAL_TABLET | Freq: Every evening | ORAL | 1 refills | Status: DC

## 2022-05-06 MED ORDER — ATORVASTATIN CALCIUM 80 MG PO TABS: 80.0000 mg | ORAL_TABLET | Freq: Every day | ORAL | 1 refills | Status: DC

## 2022-05-06 MED ORDER — INSULIN GLARGINE-YFGN 100 UNIT/ML ~~LOC~~ SOPN: 30.0000 [IU] | PEN_INJECTOR | Freq: Every day | SUBCUTANEOUS | 2 refills | Status: DC

## 2022-05-06 MED ORDER — LIRAGLUTIDE 18 MG/3ML ~~LOC~~ SOPN: 1.8000 mg | PEN_INJECTOR | Freq: Every day | SUBCUTANEOUS | 1 refills | Status: DC

## 2022-05-06 MED ORDER — MONTELUKAST SODIUM 10 MG PO TABS: 10.0000 mg | ORAL_TABLET | Freq: Every day | ORAL | 1 refills | Status: DC

## 2022-05-06 MED ORDER — AMLODIPINE BESYLATE 5 MG PO TABS: 5.0000 mg | ORAL_TABLET | Freq: Every day | ORAL | 1 refills | Status: DC

## 2022-05-06 MED ORDER — ENTRESTO 24-26 MG PO TABS: 1.0000 | ORAL_TABLET | Freq: Two times a day (BID) | ORAL | 0 refills | Status: DC

## 2022-05-18 ENCOUNTER — Other Ambulatory Visit: Payer: Self-pay | Admitting: Family Medicine

## 2022-05-30 ENCOUNTER — Other Ambulatory Visit: Payer: Self-pay | Admitting: Family Medicine

## 2022-05-30 DIAGNOSIS — I5022 Chronic systolic (congestive) heart failure: Secondary | ICD-10-CM

## 2022-06-05 ENCOUNTER — Telehealth: Payer: Self-pay | Admitting: Cardiology

## 2022-06-05 NOTE — Telephone Encounter (Signed)
Please reschedule F/U appointment for refill authorization. Patient did not show for last office visit. Thank you!

## 2022-06-11 ENCOUNTER — Ambulatory Visit (INDEPENDENT_AMBULATORY_CARE_PROVIDER_SITE_OTHER): Payer: 59 | Admitting: Nurse Practitioner

## 2022-06-11 ENCOUNTER — Encounter: Payer: Self-pay | Admitting: Nurse Practitioner

## 2022-06-11 VITALS — BP 100/80 | HR 73 | Ht 69.0 in | Wt 184.0 lb

## 2022-06-11 DIAGNOSIS — I255 Ischemic cardiomyopathy: Secondary | ICD-10-CM | POA: Insufficient documentation

## 2022-06-11 DIAGNOSIS — I10 Essential (primary) hypertension: Secondary | ICD-10-CM | POA: Insufficient documentation

## 2022-06-11 DIAGNOSIS — I5022 Chronic systolic (congestive) heart failure: Secondary | ICD-10-CM | POA: Insufficient documentation

## 2022-06-11 DIAGNOSIS — I2510 Atherosclerotic heart disease of native coronary artery without angina pectoris: Secondary | ICD-10-CM | POA: Insufficient documentation

## 2022-06-11 DIAGNOSIS — E1165 Type 2 diabetes mellitus with hyperglycemia: Secondary | ICD-10-CM

## 2022-06-11 DIAGNOSIS — Z794 Long term (current) use of insulin: Secondary | ICD-10-CM

## 2022-06-11 DIAGNOSIS — I340 Nonrheumatic mitral (valve) insufficiency: Secondary | ICD-10-CM

## 2022-06-11 DIAGNOSIS — E785 Hyperlipidemia, unspecified: Secondary | ICD-10-CM

## 2022-06-11 MED ORDER — SPIRONOLACTONE 25 MG PO TABS: 25.0000 mg | ORAL_TABLET | Freq: Every day | ORAL | 3 refills | Status: DC

## 2022-06-11 MED ORDER — ENTRESTO 24-26 MG PO TABS: 1.0000 | ORAL_TABLET | Freq: Two times a day (BID) | ORAL | 6 refills | Status: DC

## 2022-06-11 NOTE — Patient Instructions (Addendum)
Medication Instructions:  No changes at this time.   *If you need a refill on your cardiac medications before your next appointment, please call your pharmacy*   Lab Work: None  If you have labs (blood work) drawn today and your tests are completely normal, you will receive your results only by: MyChart Message (if you have MyChart) OR A paper copy in the mail If you have any lab test that is abnormal or we need to change your treatment, we will call you to review the results.   Testing/Procedures: Your physician has requested that you have an echocardiogram. Echocardiography is a painless test that uses sound waves to create images of your heart. It provides your doctor with information about the size and shape of your heart and how well your heart's chambers and valves are working. This procedure takes approximately one hour. There are no restrictions for this procedure.    Follow-Up: At [facility], you and your health needs are our priority.  As part of our continuing mission to provide you with exceptional heart care, we have created designated Provider Care Teams.  These Care Teams include your primary Cardiologist (physician) and Advanced Practice Providers (APPs -  Physician Assistants and Nurse Practitioners) who all work together to provide you with the care you need, when you need it.   Your next appointment:   2 month(s)  The format for your next appointment:   In Person  Provider:   [name], MD or Nicolasa Ducking, NP    Other Instructions We have placed a referral to see Dr. Laneta Simmers in [place]. Their office number is as follows: [phone]  Important Information About Sugar

## 2022-06-11 NOTE — Progress Notes (Signed)
Office Visit    Patient Name: Sarah Duffy Date of Encounter: 06/11/2022  Primary Care Provider:  Steele Sizer, MD Primary Cardiologist:  Sarah Sable, MD  Chief Complaint    51 year old female with a history of CAD, chronic HFrEF, ischemic cardiomyopathy, hypertension, hyperlipidemia, diabetes, and MRSA/osteomyelitis of the foot (2017), who presents for follow-up related to CAD.  Past Medical History    Past Medical History:  Diagnosis Date   Chronic HFrEF (heart failure with reduced ejection fraction) ([place])    a. 02/2021 Echo: EF 35-40%, glob HK, GrI DD, nl RV fxn, mildly dil LA, mod MR.   Coronary artery disease    a. 04/2021 Cath: LM nl, LAD 70p/m, 68m 80d, LCX 38mOM1 40, OM2 99 (fills via collats from OM1), RCA 60d, RPDA 75.   COVID-19 2021   Hyperlipidemia LDL goal <70    Hypertension    Ischemic cardiomyopathy    a. 02/2021 Echo: EF 35-40%, glob HK, GrI DD.   Moderate mitral regurgitation    a. 02/2021 Echo: Mod MR.   MRSA infection within last 3 months 02/25/2016   Osteomyelitis of foot ([facility]13/2017   Type II diabetes mellitus ([facility]   Past Surgical History:  Procedure Laterality Date   CHOLECYSTECTOMY  1999   COLONOSCOPY WITH PROPOFOL N/A 03/25/2021   Procedure: COLONOSCOPY WITH PROPOFOL;  Surgeon: AnJonathon BellowsMD;  [facility];  Service: Gastroenterology;  Laterality: N/A;   COLONOSCOPY WITH PROPOFOL N/A 04/17/2021   Procedure: COLONOSCOPY WITH PROPOFOL;  Surgeon: AnJonathon BellowsMD;  [facility];  Service: Gastroenterology;  Laterality: N/A;   ESOPHAGOGASTRODUODENOSCOPY (EGD) WITH PROPOFOL N/A 03/25/2021   Procedure: ESOPHAGOGASTRODUODENOSCOPY (EGD) WITH PROPOFOL;  Surgeon: AnJonathon BellowsMD;  [facility];  Service: Gastroenterology;  Laterality: N/A;   ESOPHAGOGASTRODUODENOSCOPY (EGD) WITH PROPOFOL N/A 04/17/2021   Procedure: ESOPHAGOGASTRODUODENOSCOPY (EGD) WITH PROPOFOL;  Surgeon: AnJonathon BellowsMD;  [facility];  Service: Gastroenterology;  Laterality: N/A;   RIGHT/LEFT HEART CATH AND CORONARY ANGIOGRAPHY Bilateral 04/14/2021   Procedure: RIGHT/LEFT HEART CATH AND CORONARY ANGIOGRAPHY;  Surgeon: EnNelva BushMD;  [facility];  Service: Cardiovascular;  Laterality: Bilateral;   TOE SURGERY Left 02/07/2016   Pinky Toe    Allergies  No Known Allergies  History of Present Illness    [age]ear old female with above complex past medical history including CAD, chronic HFrEF, ischemic cardiomyopathy, hypertension, hyperlipidemia, diabetes, and MRSA/osteomyelitis of the foot (2017).  She was previously evaluated in March 2022 in the setting of chest discomfort that started following the death of her mother.  An echocardiogram was performed and showed an EF of 35 to 40% with global hypokinesis and grade 1 diastolic dysfunction.  She subsequently underwent diagnostic catheterization which showed severe multivessel coronary artery disease as outlined above in the past medical history.  She was referred to CT surgery, and saw Dr. BaCyndia Bentn September 2022 with recommendation for coronary artery bypass grafting.    This was scheduled for September 19, 2021 however, notes indicate this was canceled secondary to staff illness.  [name] last seen in cardiology clinic in [place] 2022, at which time she was not experiencing any chest pain and had plan to contact CT surgery to reschedule bypass, though this hasn't occurred yet.  Over the past 8 months, Ms. BoEckfordotes that she has mostly felt well.  She works full-time at [employer] is very busy and on her feet throughout the day.  She does  not experience chest pain or dyspnea at work.  She notes that sometimes at the end of a long day, when returning home, she will experience some shortness of breath or fatigue, but overall, feels that she has been stable.  She has been compliant with her medications and has been working with her  primary care provider to improve her glucose management.  Since last May, her A1c has come down from 11.1 and was 8.1 this May.  She is interested in getting back with CT surgery and understands the need to have her coronary disease managed in order to reduce her risk of of MI and death.  She denies palpitations, PND, orthopnea, dizziness, syncope, edema, or early satiety.  Home Medications    Current Outpatient Medications  Medication Sig Dispense Refill   albuterol (VENTOLIN HFA) 108 (90 Base) MCG/ACT inhaler INHALE 2 PUFFS INTO THE LUNGS EVERY 6 HOURS AS NEEDED FOR WHEEZING OR SHORTNESS OF BREATH 6.7 g 5   amLODipine (NORVASC) 5 MG tablet Take 1 tablet (5 mg total) by mouth daily. 90 tablet 1   atorvastatin (LIPITOR) 80 MG tablet Take 1 tablet (80 mg total) by mouth daily. 90 tablet 1   blood glucose meter kit and supplies Dispense based on patient and insurance preference. Use up to four times daily as directed. (FOR ICD-10 E10.9, E11.9). 1 each 0   carvedilol (COREG) 12.5 MG tablet Take 1 tablet (12.5 mg total) by mouth 2 (two) times daily. 60 tablet 3   ezetimibe (ZETIA) 10 MG tablet Take 1 tablet (10 mg total) by mouth daily. 90 tablet 1   fluticasone (FLONASE) 50 MCG/ACT nasal spray Place 2 sprays into both nostrils daily. 16 g 6   insulin glargine-yfgn (SEMGLEE) 100 UNIT/ML Pen Inject 30-40 Units into the skin daily. 30 mL 2   JARDIANCE 25 MG TABS tablet Take 1 tablet (25 mg total) by mouth daily. 90 tablet 0   levocetirizine (XYZAL) 5 MG tablet Take 1 tablet (5 mg total) by mouth every evening. 90 tablet 1   liraglutide (VICTOZA) 18 MG/3ML SOPN Inject 1.8 mg into the skin daily. 15 mL 1   montelukast (SINGULAIR) 10 MG tablet Take 1 tablet (10 mg total) by mouth at bedtime. 90 tablet 1   Multiple Vitamins-Minerals (MULTIVITAMIN WITH MINERALS) tablet Take 1 tablet by mouth daily. 100 tablet 0   nitroGLYCERIN (NITROSTAT) 0.4 MG SL tablet Place 1 tablet (0.4 mg total) under the tongue every  5 (five) minutes as needed for chest pain. 25 tablet prn   sacubitril-valsartan (ENTRESTO) 24-26 MG Take 1 tablet by mouth 2 (two) times daily. 60 tablet 6   spironolactone (ALDACTONE) 25 MG tablet Take 25 mg by mouth daily. (Patient not taking: Reported on 06/11/2022)     spironolactone (ALDACTONE) 25 MG tablet Take 1 tablet (25 mg total) by mouth daily. 90 tablet 3  Aspirin 81 mg daily    Review of Systems    Occasionally notes shortness of breath at the end of a long workday but for the most part, does well.  She denies chest pain, palpitations, PND, orthopnea, dizziness, syncope, edema, or early satiety.  All other systems reviewed and are otherwise negative except as noted above.    Physical Exam    VS:  BP 100/80 (BP Location: Left Arm, Patient Position: Sitting, Cuff Size: Normal)   Pulse 73   Ht 5' 9"  (1.753 m)   Wt 184 lb (83.5 kg)   SpO2 98%   BMI 27.17  kg/m  , BMI Body mass index is 27.17 kg/m.     GEN: Well nourished, well developed, in no acute distress. HEENT: normal. Neck: Supple, no JVD, carotid bruits, or masses. Cardiac: RRR, 2/6 systolic murmur heard throughout, loudest at the upper sternal borders. No clubbing, cyanosis, trace bilateral lower extremity edema.  Radials/PT 2+ and equal bilaterally.  Respiratory:  Respirations regular and unlabored, clear to auscultation bilaterally. GI: Soft, nontender, nondistended, BS + x 4. MS: no deformity or atrophy. Skin: warm and dry, no rash. Neuro:  Strength and sensation are intact. Psych: Normal affect.  Accessory Clinical Findings    ECG personally reviewed by me today -regular sinus rhythm, 73, left axis deviation, delayed R wave progression- no acute changes.  Lab Results  Component Value Date   WBC 6.8 05/01/2022   HGB 10.5 (L) 05/01/2022   HCT 32.1 (L) 05/01/2022   MCV 88.7 05/01/2022   PLT 235 05/01/2022   Lab Results  Component Value Date   CREATININE 1.05 (H) 05/01/2022   BUN 18 05/01/2022   NA 140  05/01/2022   K 4.5 05/01/2022   CL 106 05/01/2022   CO2 25 05/01/2022   Lab Results  Component Value Date   ALT 19 05/01/2022   AST 18 05/01/2022   ALKPHOS 61 09/17/2021   BILITOT 0.9 05/01/2022   Lab Results  Component Value Date   CHOL 152 05/01/2022   HDL 38 (L) 05/01/2022   LDLCALC 95 05/01/2022   TRIG 99 05/01/2022   CHOLHDL 4.0 05/01/2022    Lab Results  Component Value Date   HGBA1C 8.1 (H) 05/01/2022    Assessment & Plan    1.  Coronary artery disease: Patient underwent diagnostic catheterization May 2022 in the setting of chest pain, which revealed severe diffuse multivessel coronary artery disease.  She was referred to CT surgery and last seen by Dr. Cyndia Duffy in September 2022, at which time, arrangements were made for bypass surgery in October.  This unfortunately had to be canceled and patient says that she had plan to reschedule but have been procrastinating some while also trying to improve her overall health and working on her diabetes.  She now feels that she is in a mental space to reconsider bypass surgery and is interested in referral, which we will make today.  Fortunately, she has not been having any chest pain or significant limitations in activities.  She does occasionally note dyspnea on exertion after a long workday but otherwise, she has been feeling well.  She remains on aspirin, beta-blocker, statin, and Zetia therapy.  2.  Chronic heart failure with reduced ejection fraction/ischemic cardiomyopathy: Echocardiogram March 2022 showed an EF of 35 to 40% with global hypokinesis, grade 1 diastolic dysfunction, and moderate mitral regurgitation.  As noted above, she has mostly felt well and has good activity tolerance.  She sometimes experiences dyspnea on exertion after a long workday.  She denies edema but does have trace ankle swelling.  She reports compliance with medications and remains on beta-blocker, Entresto, spironolactone, and empagliflozin therapy.  Labs  were stable in May.  I will follow-up echocardiogram to reevaluate her LV function and mitral regurgitation presuming that she looks to proceed with surgery in the near future.  3.  Moderate mitral regurgitation: Noted on echocardiogram last year.  She does have a systolic murmur on examination though this is more notable at the upper sternal borders.  As above, will repeat echocardiogram to evaluate valvular heart disease given plan to  proceed with surgery in the near future.  4.  Essential hypertension: Blood pressure is soft at 100/80.  She is asymptomatic.  Continue current regimen.  5.  Hyperlipidemia: Recent LDL of 95 on Lipitor 80 mg.  Zetia 10 mg daily was recently added.  I suspect this should help her to achieve a goal of less than 70.  6.  Type 2 diabetes mellitus: Patient says she has been working hard to improve her glucose levels and her A1c was down to 8.1 in May.  She is being managed by primary care and she says she also has an appoint with endocrine coming up.  7.  Disposition: Follow-up echocardiogram.  Refer back to CT surgery for consideration and scheduling of bypass.  Follow-up in clinic in 2 months or sooner if necessary.  Murray Hodgkins, NP 06/11/2022, 10:35 AM

## 2022-06-23 ENCOUNTER — Ambulatory Visit (INDEPENDENT_AMBULATORY_CARE_PROVIDER_SITE_OTHER): Payer: 59

## 2022-06-23 DIAGNOSIS — I340 Nonrheumatic mitral (valve) insufficiency: Secondary | ICD-10-CM | POA: Diagnosis not present

## 2022-06-23 LAB — ECHOCARDIOGRAM COMPLETE
AR max vel: 1.95 cm2
AV Area VTI: 2.06 cm2
AV Area mean vel: 2 cm2
AV Mean grad: 5 mmHg
AV Peak grad: 9.6 mmHg
Ao pk vel: 1.55 m/s
Area-P 1/2: 3.16 cm2
Calc EF: 48.4 %
S' Lateral: 3.7 cm
Single Plane A2C EF: 48.1 %
Single Plane A4C EF: 49.2 %

## 2022-06-25 ENCOUNTER — Telehealth: Payer: Self-pay | Admitting: *Deleted

## 2022-06-25 ENCOUNTER — Telehealth: Payer: Self-pay | Admitting: Cardiology

## 2022-06-25 DIAGNOSIS — I5022 Chronic systolic (congestive) heart failure: Secondary | ICD-10-CM

## 2022-06-25 MED ORDER — ENTRESTO 24-26 MG PO TABS: 1.0000 | ORAL_TABLET | Freq: Two times a day (BID) | ORAL | 0 refills | Status: DC

## 2022-06-25 NOTE — Telephone Encounter (Signed)
Left voicemail message to call back for results.  

## 2022-06-25 NOTE — Telephone Encounter (Signed)
Patient is returning call.  °

## 2022-06-25 NOTE — Telephone Encounter (Signed)
Requested Prescriptions  ? ?Signed Prescriptions Disp Refills  ? sacubitril-valsartan (ENTRESTO) 24-26 MG 60 tablet 0  ?  Sig: Take 1 tablet by mouth 2 (two) times daily.  ?  Authorizing Provider: AGBOR-ETANG, BRIAN  ?  Ordering User: Xareni Kelch C  ? ? ?

## 2022-06-25 NOTE — Telephone Encounter (Signed)
*  STAT* If patient is at the pharmacy, call can be transferred to refill team.   1. Which medications need to be refilled? (please list name of each medication and dose if known)  sacubitril-valsartan (ENTRESTO) 24-26 MG  2. Which pharmacy/location (including street and city if local pharmacy) is medication to be sent to? [id number] - [place], [place] - [address]  3. Do they need a 30 day or 90 day supply?  30 day supply

## 2022-06-25 NOTE — Telephone Encounter (Signed)
Left voicemail message to call back for review of results.  

## 2022-06-25 NOTE — Telephone Encounter (Signed)
-----   Message from Creig Hines, NP sent at 06/23/2022  5:51 PM EDT ----- Low normal heart squeezing function at 50-55%.  This is improved from 2022 and is great news.  The heart is moderately stiff.  This is managed with heart rate and blood pressure control and both of which were in good shape at previous office visit.  She is also on Jardiance which helps people with stiff hearts.  The mitral valve is mildly leaky.  This was previously noted to be moderately leaky in 2022 so again, some improvement likely attributed to improvement in overall squeezing function.  Reassuring findings.

## 2022-06-26 MED ORDER — NITROGLYCERIN 0.4 MG SL SUBL
0.4000 mg | SUBLINGUAL_TABLET | SUBLINGUAL | 3 refills | Status: DC | PRN
Start: 2022-06-26 — End: 2022-12-29

## 2022-06-26 NOTE — Telephone Encounter (Signed)
Reviewed results with patient all questions were answered and she verbalized understanding with no further questions at this time.

## 2022-06-29 IMAGING — CR DG CHEST 2V
2 series · 2 of 2 positions shown · non-contrast
Comparison: 09/25/2019

CLINICAL DATA: Pre-op respiratory exam for CABG.

EXAM:
CHEST - 2 VIEW

[w chest pa]
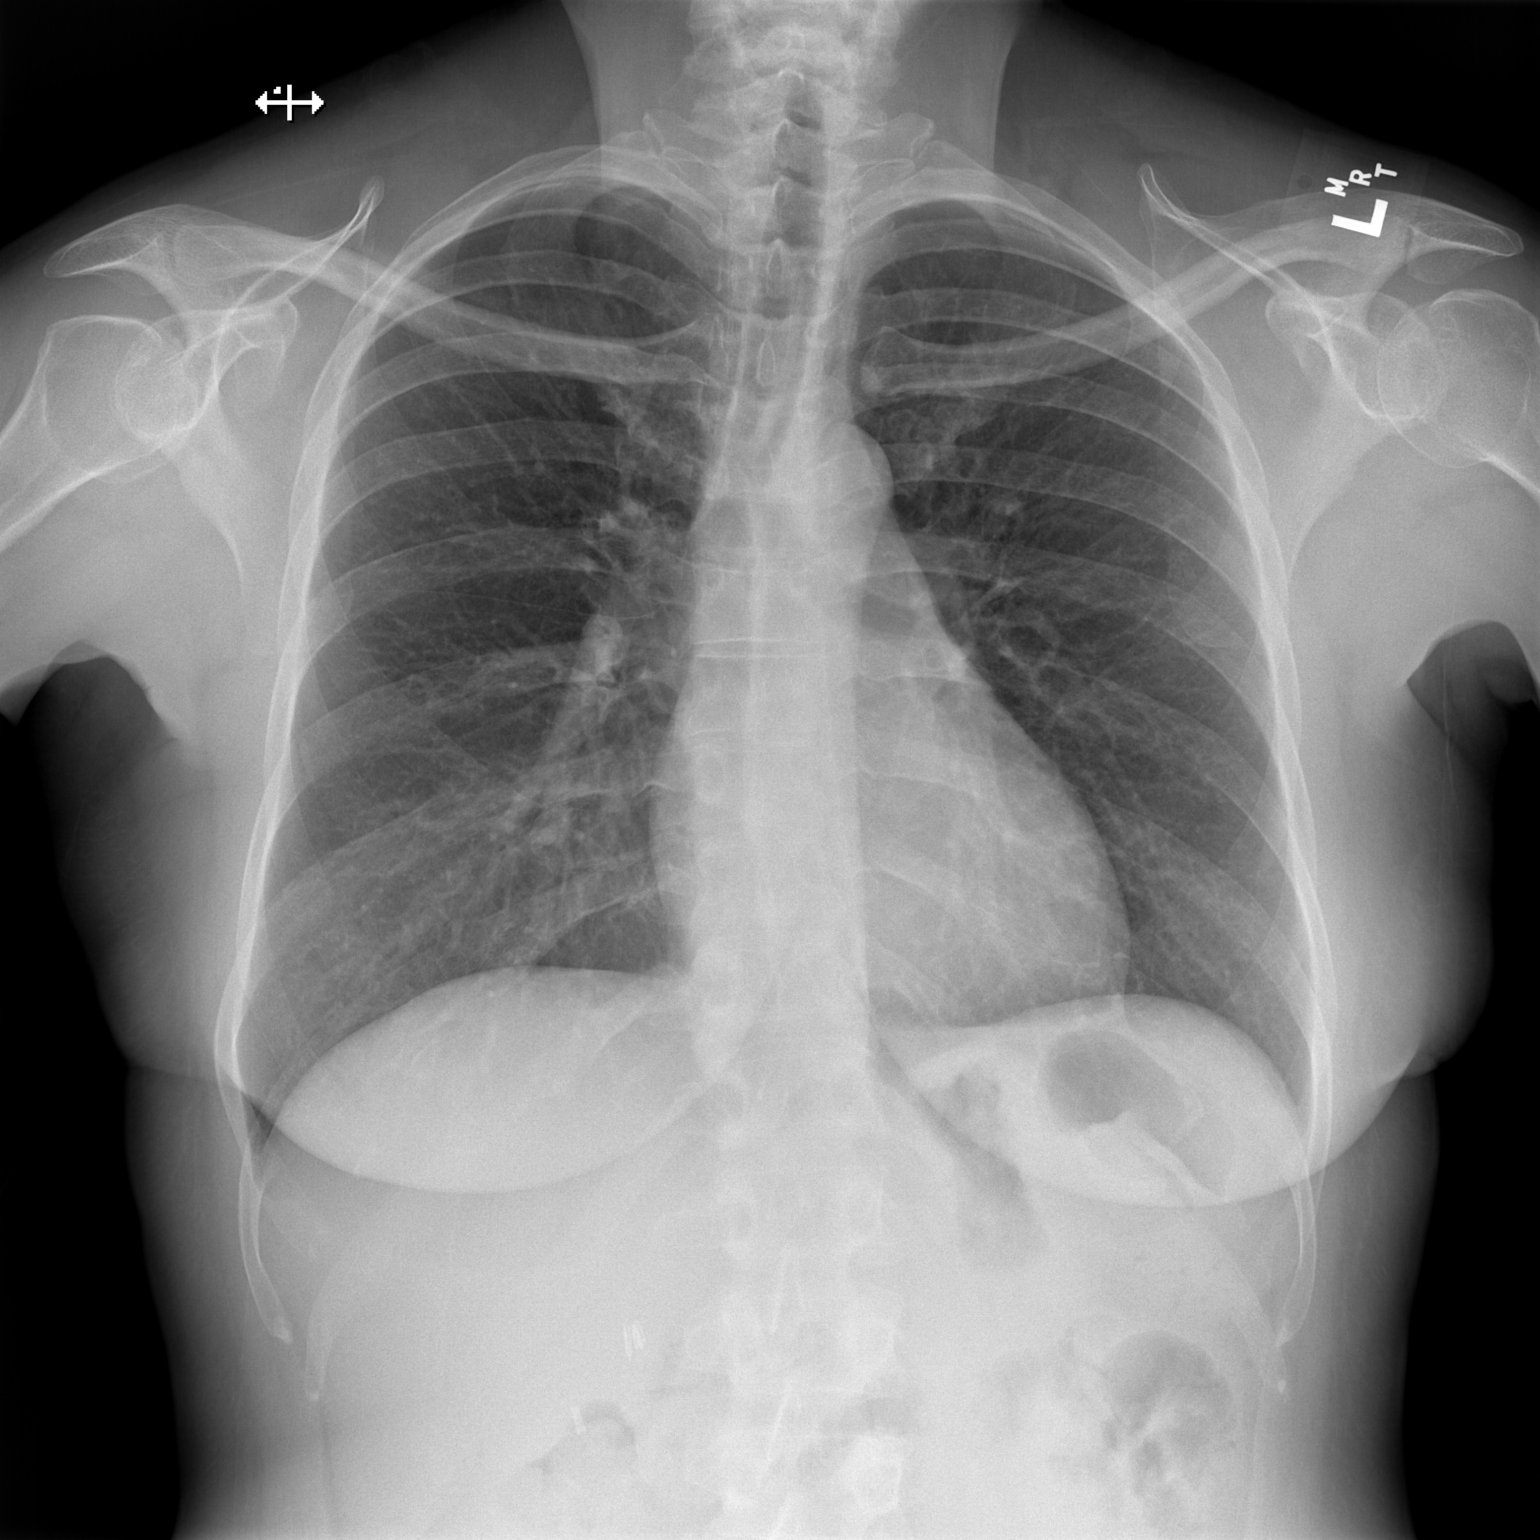

[w chest lat]
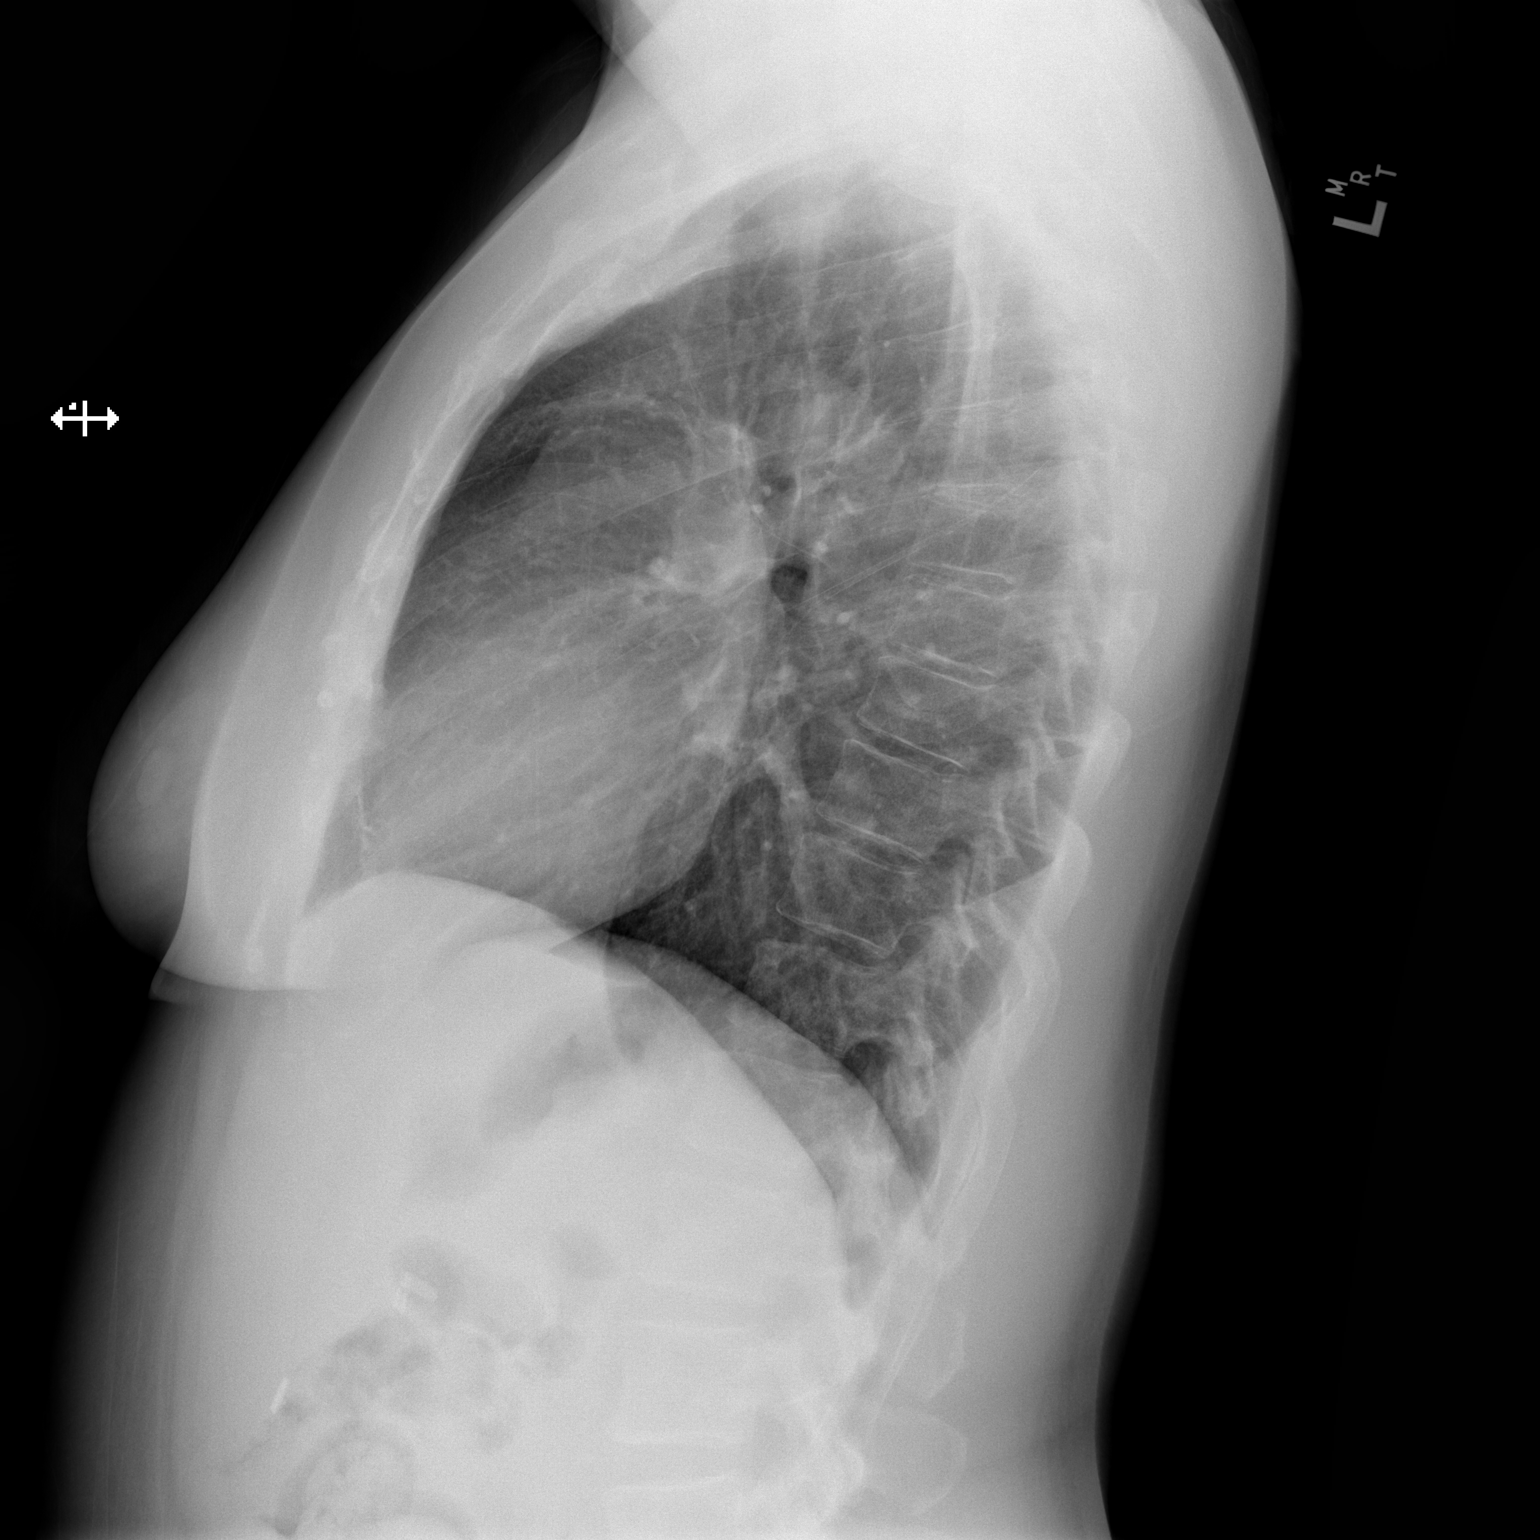

[2 of 2 positions shown; findings below may reference images not displayed]

FINDINGS: The heart size and mediastinal contours are within normal limits.
Both lungs are clear. The visualized skeletal structures are
unremarkable.
IMPRESSION: No active cardiopulmonary disease.

## 2022-07-03 ENCOUNTER — Other Ambulatory Visit: Payer: Self-pay | Admitting: Family Medicine

## 2022-07-03 DIAGNOSIS — J069 Acute upper respiratory infection, unspecified: Secondary | ICD-10-CM

## 2022-07-28 ENCOUNTER — Ambulatory Visit
Admission: RE | Admit: 2022-07-28 | Discharge: 2022-07-28 | Disposition: A | Discharge status: Home / Self Care | Payer: 59 | Source: Ambulatory Visit | Attending: Family Medicine | Admitting: Family Medicine

## 2022-07-28 DIAGNOSIS — Z1231 Encounter for screening mammogram for malignant neoplasm of breast: Secondary | ICD-10-CM | POA: Diagnosis present

## 2022-08-03 ENCOUNTER — Other Ambulatory Visit: Payer: Self-pay

## 2022-08-03 MED ORDER — CARVEDILOL 12.5 MG PO TABS: 12.5000 mg | ORAL_TABLET | Freq: Two times a day (BID) | ORAL | 0 refills | Status: DC

## 2022-08-05 NOTE — Progress Notes (Unsigned)
Name: Sarah Duffy   MRN: [id number]    DOB: [birth year]   Date:08/06/2022       Progress Note  Subjective  Chief Complaint  Follow Up  HPI  DMII: history of left foot ulcer and osteomyelitis, microalbuminuria, hypertension, retinopathy and dyslipidemia.  She is currently taking generic Lantus 30 units in am and 40  units at night  plus  Jardiance 43m and Victoza 1.8 mg A1C down from 8.1 % to 7.9 %, she is trying to eat better, replaced chips with high protein yogurt, eating more vegetables, also oatmeal for breakfast  . [facility] She works at [employer] also cutting down on [employer] eating some lunch meat and cheese for snacks.   MDD: she had a gap in her insurance coverage and has been without medications for months. She lost her mother unexpectedly on Feb 4 th , 20.  she does not want to resume medications at this time . She is in remission, phq 9 is negative   HTN: bp is towards the low end of normal but denies dizziness/orthostatic changes.   CAD/Chronic systolic heart failure/Cardiomyopathy: under the care of Dr. AMylo Red [facility] , she will has a follow up with cardiovascular surgeon Dr. [place]dia [place] , she was ready to have CABG on 09/19/2021 but it was cancelled by the physician due to personal problems. She denies any recent chest pain, taking medication daily . She has ischemic cardiomyopathy but currently no symptoms. Denies orthopnea, PND or lower extremity edema she has some SOB with activity. She has a recent visit with cardiologist and is still on medical management going back for follow next week and after that will see her cardiovascular surgeon to decide the next steps.   Conclusions Cath :   Multivessel coronary artery disease including mild to moderate diffuse plaquing of the LAD and RCA.  The mid/distal LAD demonstrates 3 focal lesions of up to 80% that are  hemodynamically significant by iFR (iFR = 0.71).  There is also a 99% stenosis of OM2 with faint left to left collaterals supplying the distal branch as well as sequential 60% distal RCA and 70-80% proximal RPDA lesions.   IMPRESSIONS  Echo done 06/23/2022 showed improvement   Left ventricular ejection fraction, by estimation, is 50 to 55%. Left ventricular ejection fraction by 3D volume is 52 %. Left ventricular ejection fraction by PLAX is 53 %. The left ventricle has low normal function. The left ventricle has no regional wall motion abnormalities. Left ventricular diastolic parameters are consistent with Grade II diastolic dysfunction (pseudonormalization). The average left ventricular global longitudinal strain is -18.9 %. 1. Right ventricular systolic function is normal. The right ventricular size is normal. There is normal pulmonary artery systolic pressure. The estimated right ventricular systolic pressure is 231.5mmHg. 2. 3. Left atrial size was mildly dilated. The mitral valve is normal in structure. Mild mitral valve regurgitation. No evidence of mitral stenosis. 4. The aortic valve is normal in structure. Aortic valve regurgitation is not visualized. Aortic valve sclerosis is present, with no evidence of aortic valve stenosis. 5. The inferior vena cava is normal in size with greater than 50% respiratory variability, suggesting right atrial pressure of 3 mmHg. 6. Comparison(s): Previous GLS reported as -13.5 Previous LVEF reported as 35-40%.  Patient Active Problem List   Diagnosis Date Noted   Hypertension 06/11/2022   Ischemic cardiomyopathy 06/11/2022  Coronary artery disease 06/11/2022   Chronic HFrEF (heart failure with reduced ejection fraction) ([place]) 06/11/2022   Angina pectoris associated with type 2 diabetes mellitus ([place]) 01/05/2022   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 edema associated with type 2 diabetes mellitus ([place]) 05/01/2021    Cardiomyopathy ([place]) 04/14/2021   Depression, major, recurrent, mild ([place]) 09/25/2019   History of 2019 novel coronavirus disease (COVID-19) 06/23/2019   Migraine headache without aura 01/14/2018   Type II diabetes mellitus ([place]) 05/22/2017   Diabetic retinopathy of left eye, with macular edema, with severe nonproliferative retinopathy, associated with type 2 diabetes mellitus 05/21/2016   Severe nonproliferative diabetic retinopathy of right eye without macular edema associated with type 2 diabetes mellitus ([place]) 05/21/2016   Hyperlipidemia LDL goal <70 03/10/2016   MI (mitral incompetence) 02/25/2016   Insomnia 02/25/2016   Anxiety 09/04/2014    Past Surgical History:  Procedure Laterality Date   CHOLECYSTECTOMY  1999   COLONOSCOPY WITH PROPOFOL N/A 03/25/2021   Procedure: COLONOSCOPY WITH PROPOFOL;  Surgeon: Jonathon Bellows, MD;  Location: [facility] ENDOSCOPY;  Service: Gastroenterology;  Laterality: N/A;   COLONOSCOPY WITH PROPOFOL N/A 04/17/2021   Procedure: COLONOSCOPY WITH PROPOFOL;  Surgeon: Jonathon Bellows, MD;  Location: [facility] ENDOSCOPY;  Service: Gastroenterology;  Laterality: N/A;   ESOPHAGOGASTRODUODENOSCOPY (EGD) WITH PROPOFOL N/A 03/25/2021   Procedure: ESOPHAGOGASTRODUODENOSCOPY (EGD) WITH PROPOFOL;  Surgeon: Jonathon Bellows, MD;  Location: [facility] ENDOSCOPY;  Service: Gastroenterology;  Laterality: N/A;   ESOPHAGOGASTRODUODENOSCOPY (EGD) WITH PROPOFOL N/A 04/17/2021   Procedure: ESOPHAGOGASTRODUODENOSCOPY (EGD) WITH PROPOFOL;  Surgeon: Jonathon Bellows, MD;  Location: [facility] ENDOSCOPY;  Service: Gastroenterology;  Laterality: N/A;   RIGHT/LEFT HEART CATH AND CORONARY ANGIOGRAPHY Bilateral 04/14/2021   Procedure: RIGHT/LEFT HEART CATH AND CORONARY ANGIOGRAPHY;  Surgeon: Nelva Bush, MD;  Location: [place] CV LAB;  Service: Cardiovascular;  Laterality: Bilateral;   TOE SURGERY Left 02/07/2016   Pinky Toe    Family History  Problem Relation Age of Onset   Diabetes Mother    Ulcers Mother     Heart disease Father    AAA (abdominal aortic aneurysm) Father    Diabetes Father    Hypertension Father    Stroke Father    Alzheimer's disease Father    Heart attack Sister    Seizures Brother    Diabetes Maternal Grandmother    Breast cancer Maternal Grandmother     Social History   Tobacco Use   Smoking status: Never   Smokeless tobacco: Never  Substance Use Topics   Alcohol use: No    Alcohol/week: 0.0 standard drinks of alcohol     Current Outpatient Medications:    albuterol (VENTOLIN HFA) 108 (90 Base) MCG/ACT inhaler, INHALE 2 PUFFS INTO THE LUNGS EVERY 6 HOURS AS NEEDED FOR WHEEZING OR SHORTNESS OF BREATH, Disp: 6.7 g, Rfl: 0   amLODipine (NORVASC) 5 MG tablet, Take 1 tablet (5 mg total) by mouth daily., Disp: 90 tablet, Rfl: 1   atorvastatin (LIPITOR) 80 MG tablet, Take 1 tablet (80 mg total) by mouth daily., Disp: 90 tablet, Rfl: 1   blood glucose meter kit and supplies, Dispense based on patient and insurance preference. Use up to four times daily as directed. (FOR ICD-10 E10.9, E11.9)., Disp: 1 each, Rfl: 0   carvedilol (COREG) 12.5 MG tablet, Take 1 tablet (12.5 mg total) by mouth 2 (two) times daily., Disp: 60 tablet, Rfl: 0   ezetimibe (ZETIA) 10 MG tablet, Take 1 tablet (10 mg total) by mouth daily., Disp: 90 tablet,  Rfl: 1   fluticasone (FLONASE) 50 MCG/ACT nasal spray, Place 2 sprays into both nostrils daily., Disp: 16 g, Rfl: 6   insulin glargine-yfgn (SEMGLEE) 100 UNIT/ML Pen, Inject 30-40 Units into the skin daily., Disp: 30 mL, Rfl: 2   JARDIANCE 25 MG TABS tablet, Take 1 tablet (25 mg total) by mouth daily., Disp: 90 tablet, Rfl: 0   levocetirizine (XYZAL) 5 MG tablet, Take 1 tablet (5 mg total) by mouth every evening., Disp: 90 tablet, Rfl: 1   liraglutide (VICTOZA) 18 MG/3ML SOPN, Inject 1.8 mg into the skin daily., Disp: 15 mL, Rfl: 1   montelukast (SINGULAIR) 10 MG tablet, Take 1 tablet (10 mg total) by mouth at bedtime., Disp: 90 tablet, Rfl: 1    Multiple Vitamins-Minerals (MULTIVITAMIN WITH MINERALS) tablet, Take 1 tablet by mouth daily., Disp: 100 tablet, Rfl: 0   nitroGLYCERIN (NITROSTAT) 0.4 MG SL tablet, Place 1 tablet (0.4 mg total) under the tongue every 5 (five) minutes as needed for chest pain., Disp: 25 tablet, Rfl: 3   sacubitril-valsartan (ENTRESTO) 24-26 MG, Take 1 tablet by mouth 2 (two) times daily., Disp: 60 tablet, Rfl: 0   spironolactone (ALDACTONE) 25 MG tablet, Take 1 tablet (25 mg total) by mouth daily., Disp: 90 tablet, Rfl: 3  No Known Allergies  I personally reviewed active problem list, medication list, allergies, family history, social history, health maintenance with the patient/caregiver today.   ROS  Constitutional: Negative for fever or weight change.  Respiratory: Negative for cough , positive for  shortness of breath with activity .   Cardiovascular: Negative for chest pain or palpitations.  Gastrointestinal: Negative for abdominal pain, no bowel changes.  Musculoskeletal: Negative for gait problem or joint swelling.  Skin: Negative for rash.  Neurological: Negative for dizziness or headache.  No other specific complaints in a complete review of systems (except as listed in HPI above).   Objective  Vitals:   08/06/22 0931  BP: 122/66  Pulse: 78  Resp: 16  SpO2: 97%  Weight: 184 lb (83.5 kg)  Height: 5' 9"  (1.753 m)    Body mass index is 27.17 kg/m.  Physical Exam  Constitutional: Patient appears well-developed and well-nourished.  No distress.  HEENT: head atraumatic, normocephalic, pupils equal and reactive to light, neck supple Cardiovascular: Normal rate, regular rhythm and normal heart sounds.  No murmur heard. No BLE edema. Pulmonary/Chest: Effort normal and breath sounds normal. No respiratory distress. Abdominal: Soft.  There is no tenderness. Psychiatric: Patient has a normal mood and affect. behavior is normal. Judgment and thought content normal.   Recent Results (from the  past 2160 hour(s))  ECHOCARDIOGRAM COMPLETE     Status: None   Collection Time: 06/23/22  1:37 PM  Result Value Ref Range   AR max vel 1.95 cm2   AV Peak grad 9.6 mmHg   Ao pk vel 1.55 m/s   S' Lateral 3.70 cm   Area-P 1/2 3.16 cm2   AV Area VTI 2.06 cm2   AV Mean grad 5.0 mmHg   Single Plane A4C EF 49.2 %   Single Plane A2C EF 48.1 %   Calc EF 48.4 %   AV Area mean vel 2.00 cm2  POCT HgB A1C     Status: Abnormal   Collection Time: 08/06/22  9:40 AM  Result Value Ref Range   Hemoglobin A1C 7.9 (A) 4.0 - 5.6 %   HbA1c POC (<> result, manual entry)     HbA1c, POC (prediabetic range)  HbA1c, POC (controlled diabetic range)       PHQ2/9:    08/06/2022    9:39 AM 05/06/2022   10:03 AM 05/01/2022    8:47 AM 03/10/2022    9:02 AM 01/07/2022    9:22 AM  Depression screen PHQ 2/9  Decreased Interest 0 0 0 0 0  Down, Depressed, Hopeless 0 0 0 0 0  PHQ - 2 Score 0 0 0 0 0  Altered sleeping 0 0 0 0 0  Tired, decreased energy 0 0 0 0 0  Change in appetite 0 0 0 0 0  Feeling bad or failure about yourself  0 0 0 0 0  Trouble concentrating 0 0 0 0 0  Moving slowly or fidgety/restless 0 0 0 0 0  Suicidal thoughts 0 0 0 0 0  PHQ-9 Score 0 0 0 0 0  Difficult doing work/chores    Not difficult at all     phq 9 is negative   Fall Risk:    08/06/2022    9:39 AM 05/06/2022   10:03 AM 05/01/2022    8:39 AM 03/10/2022    9:02 AM 01/07/2022    9:21 AM  Fall Risk   Falls in the past year? 0 0 0 0 0  Number falls in past yr: 0 0  0 0  Injury with Fall? 0 0  0 0  Risk for fall due to : No Fall Risks No Fall Risks No Fall Risks  No Fall Risks  Follow up Falls prevention discussed Falls prevention discussed Falls prevention discussed;Education provided;Falls evaluation completed  Falls prevention discussed      Functional Status Survey: Is the patient deaf or have difficulty hearing?: No Does the patient have difficulty seeing, even when wearing glasses/contacts?: No Does the patient  have difficulty concentrating, remembering, or making decisions?: No Does the patient have difficulty walking or climbing stairs?: No Does the patient have difficulty dressing or bathing?: No Does the patient have difficulty doing errands alone such as visiting a doctor's office or shopping?: No    Assessment & Plan  1. 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(HCC)  - POCT HgB A1C - JARDIANCE 25 MG TABS tablet; Take 1 tablet (25 mg total) by mouth daily.  Dispense: 90 tablet; Refill: 0 - insulin glargine-yfgn (SEMGLEE) 100 UNIT/ML Pen; Inject 30-40 Units into the skin daily.  Dispense: 30 mL; Refill: 2  2. Need for immunization against influenza  - Flu Vaccine QUAD 6+ mos PF IM (Fluarix Quad PF)  3. Depression, major, in remission ([place])   4. Chronic systolic heart failure (HCC)  - JARDIANCE 25 MG TABS tablet; Take 1 tablet (25 mg total) by mouth daily.  Dispense: 90 tablet; Refill: 0  5. Coronary artery disease due to lipid rich plaque   6. Dyslipidemia due to type 2 diabetes mellitus (HCC)  - JARDIANCE 25 MG TABS tablet; Take 1 tablet (25 mg total) by mouth daily.  Dispense: 90 tablet; Refill: 0 - insulin glargine-yfgn (SEMGLEE) 100 UNIT/ML Pen; Inject 30-40 Units into the skin daily.  Dispense: 30 mL; Refill: 2   7. Need for pneumococcal 20-valent conjugate vaccination  - Pneumococcal conjugate vaccine 20-valent (Prevnar 20)

## 2022-08-06 ENCOUNTER — Ambulatory Visit (INDEPENDENT_AMBULATORY_CARE_PROVIDER_SITE_OTHER): Payer: 59 | Admitting: Family Medicine

## 2022-08-06 ENCOUNTER — Encounter: Payer: Self-pay | Admitting: Family Medicine

## 2022-08-06 VITALS — BP 122/66 | HR 78 | Resp 16 | Ht 69.0 in | Wt 184.0 lb

## 2022-08-06 DIAGNOSIS — R809 Proteinuria, unspecified: Secondary | ICD-10-CM

## 2022-08-06 DIAGNOSIS — E1169 Type 2 diabetes mellitus with other specified complication: Secondary | ICD-10-CM

## 2022-08-06 DIAGNOSIS — Z23 Encounter for immunization: Secondary | ICD-10-CM

## 2022-08-06 DIAGNOSIS — I5022 Chronic systolic (congestive) heart failure: Secondary | ICD-10-CM

## 2022-08-06 DIAGNOSIS — E1129 Type 2 diabetes mellitus with other diabetic kidney complication: Secondary | ICD-10-CM

## 2022-08-06 DIAGNOSIS — F325 Major depressive disorder, single episode, in full remission: Secondary | ICD-10-CM | POA: Diagnosis not present

## 2022-08-06 DIAGNOSIS — I2510 Atherosclerotic heart disease of native coronary artery without angina pectoris: Secondary | ICD-10-CM

## 2022-08-06 DIAGNOSIS — E785 Hyperlipidemia, unspecified: Secondary | ICD-10-CM

## 2022-08-06 DIAGNOSIS — I2583 Coronary atherosclerosis due to lipid rich plaque: Secondary | ICD-10-CM

## 2022-08-06 LAB — POCT GLYCOSYLATED HEMOGLOBIN (HGB A1C): Hemoglobin A1C: 7.9 % — AB (ref 4.0–5.6)

## 2022-08-06 MED ORDER — INSULIN GLARGINE-YFGN 100 UNIT/ML ~~LOC~~ SOPN: 30.0000 [IU] | PEN_INJECTOR | Freq: Every day | SUBCUTANEOUS | 2 refills | Status: DC

## 2022-08-06 MED ORDER — JARDIANCE 25 MG PO TABS: 25.0000 mg | ORAL_TABLET | Freq: Every day | ORAL | 0 refills | Status: DC

## 2022-08-11 ENCOUNTER — Encounter: Payer: Self-pay | Admitting: Nurse Practitioner

## 2022-08-11 ENCOUNTER — Ambulatory Visit: Payer: 59 | Attending: Nurse Practitioner | Admitting: Nurse Practitioner

## 2022-08-11 VITALS — BP 98/62 | HR 69 | Ht 69.0 in | Wt 179.0 lb

## 2022-08-11 DIAGNOSIS — I2510 Atherosclerotic heart disease of native coronary artery without angina pectoris: Secondary | ICD-10-CM

## 2022-08-11 DIAGNOSIS — E785 Hyperlipidemia, unspecified: Secondary | ICD-10-CM

## 2022-08-11 DIAGNOSIS — I340 Nonrheumatic mitral (valve) insufficiency: Secondary | ICD-10-CM

## 2022-08-11 DIAGNOSIS — I255 Ischemic cardiomyopathy: Secondary | ICD-10-CM | POA: Diagnosis not present

## 2022-08-11 DIAGNOSIS — E1165 Type 2 diabetes mellitus with hyperglycemia: Secondary | ICD-10-CM

## 2022-08-11 DIAGNOSIS — Z794 Long term (current) use of insulin: Secondary | ICD-10-CM

## 2022-08-11 DIAGNOSIS — I10 Essential (primary) hypertension: Secondary | ICD-10-CM

## 2022-08-11 DIAGNOSIS — I5022 Chronic systolic (congestive) heart failure: Secondary | ICD-10-CM | POA: Diagnosis not present

## 2022-08-11 MED ORDER — SPIRONOLACTONE 25 MG PO TABS: 25.0000 mg | ORAL_TABLET | Freq: Every day | ORAL | 3 refills | Status: DC

## 2022-08-11 MED ORDER — CARVEDILOL 12.5 MG PO TABS: 12.5000 mg | ORAL_TABLET | Freq: Two times a day (BID) | ORAL | 3 refills | Status: DC

## 2022-08-11 MED ORDER — ASPIRIN 81 MG PO TBEC: 81.0000 mg | DELAYED_RELEASE_TABLET | Freq: Every day | ORAL | 3 refills | Status: DC

## 2022-08-11 MED ORDER — ENTRESTO 24-26 MG PO TABS: 1.0000 | ORAL_TABLET | Freq: Two times a day (BID) | ORAL | 3 refills | Status: DC

## 2022-08-11 NOTE — Patient Instructions (Signed)
Medication Instructions:   Your physician recommends that you continue on your current medications as directed. Please refer to the Current Medication list given to you today.    *If you need a refill on your cardiac medications before your next appointment, please call your pharmacy*   Lab Work: NONE ORDERED  TODAY    If you have labs (blood work) drawn today and your tests are completely normal, you will receive your results only by: MyChart Message (if you have MyChart) OR A paper copy in the mail If you have any lab test that is abnormal or we need to change your treatment, we will call you to review the results.   Testing/Procedures: NONE ORDERED  TODAY     Follow-Up: At [facility], you and your health needs are our priority.  As part of our continuing mission to provide you with exceptional heart care, we have created designated Provider Care Teams.  These Care Teams include your primary Cardiologist (physician) and Advanced Practice Providers (APPs -  Physician Assistants and Nurse Practitioners) who all work together to provide you with the care you need, when you need it.  We recommend signing up for the patient portal called "MyChart".  Sign up information is provided on this After Visit Summary.  MyChart is used to connect with patients for Virtual Visits (Telemedicine).  Patients are able to view lab/test results, encounter notes, upcoming appointments, etc.  Non-urgent messages can be sent to your provider as well.   To learn more about what you can do with MyChart, go to [email or web address].    Your next appointment:   2 month(s)  The format for your next appointment:   In Person  Provider:   You may see [name], MD or one of the following Advanced Practice Providers on your designated Care Team:   Nicolasa Ducking, NP  Other Instructions   Important Information About Sugar

## 2022-08-11 NOTE — Progress Notes (Signed)
Office Visit    Patient Name: Sarah Duffy Date of Encounter: 08/11/2022  Primary Care Provider:  Steele Sizer, MD Primary Cardiologist:  Sarah Sable, MD  Chief Complaint    51 year old female with a history of CAD, chronic heart failure with improved EF, ischemic cardiomyopathy, hypertension, hyperlipidemia, diabetes, and MRSA/osteomyelitis foot (2017), for follow-up CAD and CHF.  Past Medical History    Past Medical History:  Diagnosis Date   Chronic HFimpEF (heart failure with improved ejection fraction) ([place])    a. 02/2021 Echo: EF 35-40%, glob HK, GrI DD, nl RV fxn, mildly dil LA, mod MR; b. 06/2022 Echo: EF 50-55%, no rwma, GrII DD, nl RV fxn, RVSP 40mHg, mildly dil LA, mild MR, AoV sclerosis.   Coronary artery disease    a. 04/2021 Cath: LM nl, LAD 70p/m, 667m80d, LCX 3082mM1 40, OM2 99 (fills via collats from OM1), RCA 60d, RPDA 75.   COVID-19 2021   Hyperlipidemia LDL goal <70    Hypertension    Ischemic cardiomyopathy    a. 02/2021 Echo: EF 35-40%, glob HK, GrI DD; b. 06/2022 Echo: EF 50-55%.   Moderate mitral regurgitation    a. 02/2021 Echo: Mod MR.   MRSA infection within last 3 months 02/25/2016   [place]   [place] II diabetes mellitus [place]  Past Surgical History:  Procedure Laterality Date   CHOLECYSTECTOMY  1999   COLONOSCOPY WITH PROPOFOL N/A 03/25/2021   Procedure: COLONOSCOPY WITH PROPOFOL;  Surgeon: AnnJonathon Duffy;  [facility];  Service: Gastroenterology;  Laterality: N/A;   COLONOSCOPY WITH PROPOFOL N/A 04/17/2021   Procedure: COLONOSCOPY WITH PROPOFOL;  Surgeon: AnnJonathon Duffy;  [facility];  Service: Gastroenterology;  Laterality: N/A;   ESOPHAGOGASTRODUODENOSCOPY (EGD) WITH PROPOFOL N/A 03/25/2021   Procedure: ESOPHAGOGASTRODUODENOSCOPY (EGD) WITH PROPOFOL;  Surgeon: AnnJonathon Duffy;  [facility];  Service: Gastroenterology;  Laterality: N/A;   ESOPHAGOGASTRODUODENOSCOPY  (EGD) WITH PROPOFOL N/A 04/17/2021   Procedure: ESOPHAGOGASTRODUODENOSCOPY (EGD) WITH PROPOFOL;  Surgeon: AnnJonathon Duffy;  [facility];  Service: Gastroenterology;  Laterality: N/A;   RIGHT/LEFT HEART CATH AND CORONARY ANGIOGRAPHY Bilateral 04/14/2021   Procedure: RIGHT/LEFT HEART CATH AND CORONARY ANGIOGRAPHY;  Surgeon: EndNelva Duffy;  [facility];  Service: Cardiovascular;  Laterality: Bilateral;   TOE SURGERY Left 02/07/2016   [place] To[place]    Allergies  No Known Allergies  History of Present Illness    [age]ar old female with the above complex past medical history including CAD, chronic heart failure with improved EF, ischemic cardiomyopathy, hypertension, hyperlipidemia, diabetes, and MRSA/osteomyelitis of the foot in 2017.  She was previous evaluated March 2022 in the setting of chest discomfort that started following the death of her mother.  An echocardiogram was performed and showed an EF of 35 to 40% with global hypokinesis and grade 1 diastolic dysfunction.  She subsequently underwent diagnostic catheterization, which showed severe multivessel CAD as outlined above in the past medical history.  She was referred to CT surgery and saw Dr. BarCyndia Duffy September 2022 with recommendation for coronary artery bypass grafting.  This was initially scheduled for September 19, 2021 however, notes indicate that this was canceled secondary to staff illness.  She was feeling well at cardiology clinic visit in November 2022 and deferred rescheduling her CT surgery office visit.  [name] last seen in cardiology clinic in June 2023.  She noted occasional dyspnea or fatigue after a long day at  work at the [employer] however, she was not experiencing any chest pain.  She expressed an interest in being referred back to CT surgery and is scheduled for follow-up tomorrow, August 30.  In the interim, an echocardiogram has been performed and showed improvement in EF to 50 to  55% with grade 2 diastolic dysfunction, normal RV function, mild MR, and aortic valve sclerosis without stenosis.  Since her last visit, she has done well.  She remains reasonably active without chest pain or dyspnea.  She continues to work full-time at the [employer].  She denies palpitations, PND, orthopnea, dizziness, syncope, edema, or early satiety.  Home Medications    Prior to Admission medications   Medication Sig Start Date Sarah Date Taking? Authorizing Provider  albuterol (VENTOLIN HFA) 108 (90 Base) MCG/ACT inhaler INHALE 2 PUFFS INTO THE LUNGS EVERY 6 HOURS AS NEEDED FOR WHEEZING OR SHORTNESS OF BREATH 07/03/22  Yes [name], Sarah Stager, MD  amLODipine (NORVASC) 5 MG tablet Take 1 tablet (5 mg total) by mouth daily. 05/06/22  Yes Sarah Sizer, MD  aspirin EC 81 MG tablet Take 1 tablet (81 mg total) by mouth daily. Swallow whole. 08/11/22  Yes Sarah Gianotti, NP  atorvastatin (LIPITOR) 80 MG tablet Take 1 tablet (80 mg total) by mouth daily. 05/06/22  Yes Sarah Sizer, MD  blood glucose meter kit and supplies Dispense based on patient and insurance preference. Use up to four times daily as directed. (FOR ICD-10 E10.9, E11.9). 08/21/21  Yes [name], Sarah Stager, MD  ezetimibe (ZETIA) 10 MG tablet Take 1 tablet (10 mg total) by mouth daily. 05/06/22  Yes [name], Sarah Stager, MD  fluticasone (FLONASE) 50 MCG/ACT nasal spray Place 2 sprays into both nostrils daily. 05/06/22  Yes [name], Sarah Stager, MD  insulin glargine-yfgn (SEMGLEE) 100 UNIT/ML Pen Inject 30-40 Units into the skin daily. 08/06/22  Yes [name], Sarah Stager, MD  JARDIANCE 25 MG TABS tablet Take 1 tablet (25 mg total) by mouth daily. 08/06/22  Yes [name], Sarah Stager, MD  levocetirizine (XYZAL) 5 MG tablet Take 1 tablet (5 mg total) by mouth every evening. 05/06/22  Yes [name], Sarah Stager, MD  liraglutide (VICTOZA) 18 MG/3ML SOPN Inject 1.8 mg into the skin daily. 05/06/22  Yes [name], Sarah Stager, MD  montelukast (SINGULAIR) 10 MG tablet Take 1 tablet  (10 mg total) by mouth at bedtime. 05/06/22  Yes [name], Sarah Stager, MD  Multiple Vitamins-Minerals (MULTIVITAMIN WITH MINERALS) tablet Take 1 tablet by mouth daily. 05/29/20  Yes Caren Macadam, MD  nitroGLYCERIN (NITROSTAT) 0.4 MG SL tablet Place 1 tablet (0.4 mg total) under the tongue every 5 (five) minutes as needed for chest pain. 06/26/22 06/26/23 Yes Sarah Gianotti, NP  carvedilol (COREG) 12.5 MG tablet Take 1 tablet (12.5 mg total) by mouth 2 (two) times daily. 08/11/22   Sarah Gianotti, NP  sacubitril-valsartan (ENTRESTO) 24-26 MG Take 1 tablet by mouth 2 (two) times daily. 08/11/22   Sarah Gianotti, NP  spironolactone (ALDACTONE) 25 MG tablet Take 1 tablet (25 mg total) by mouth daily. 08/11/22   Sarah Gianotti, NP       Review of Systems    Doing well.  She denies chest pain, palpitations, dyspnea, pnd, orthopnea, n, v, dizziness, syncope, edema, weight gain, or early satiety.  All other systems reviewed and are otherwise negative except as noted above.    Physical Exam    VS:  BP 98/62   Pulse 69   Ht 5' 9"  (1.753 m)   Wt 179 lb (  81.2 kg)   SpO2 98%   BMI 26.43 kg/m  , BMI Body mass index is 26.43 kg/m.     GEN: Well nourished, well developed, in no acute distress. HEENT: normal. Neck: Supple, no JVD, carotid bruits, or masses. Cardiac: RRR, no rubs or gallops.  2/6 systolic murmur at the upper sternal borders. No clubbing, cyanosis, edema.  Radials/PT 2+ and equal bilaterally.  Respiratory:  Respirations regular and unlabored, clear to auscultation bilaterally. GI: Soft, nontender, nondistended, BS + x 4. MS: no deformity or atrophy. Skin: warm and dry, no rash. Neuro:  Strength and sensation are intact. Psych: Normal affect.  Accessory Clinical Findings    ECG personally reviewed by me today -regular sinus rhythm, 69, left axis deviation, left anterior fascicular block, left atrial enlargement, poor R wave progression- no  acute changes.  Lab Results  Component Value Date   WBC 6.8 05/01/2022   HGB 10.5 (L) 05/01/2022   HCT 32.1 (L) 05/01/2022   MCV 88.7 05/01/2022   PLT 235 05/01/2022   Lab Results  Component Value Date   CREATININE 1.05 (H) 05/01/2022   BUN 18 05/01/2022   NA 140 05/01/2022   K 4.5 05/01/2022   CL 106 05/01/2022   CO2 25 05/01/2022   Lab Results  Component Value Date   ALT 19 05/01/2022   AST 18 05/01/2022   ALKPHOS 61 09/17/2021   BILITOT 0.9 05/01/2022   Lab Results  Component Value Date   CHOL 152 05/01/2022   HDL 38 (L) 05/01/2022   LDLCALC 95 05/01/2022   TRIG 99 05/01/2022   CHOLHDL 4.0 05/01/2022    Lab Results  Component Value Date   HGBA1C 7.9 (A) 08/06/2022    Assessment & Plan    1.  Coronary artery disease: Patient underwent diagnostic catheterization May 2022 in the setting of chest pain, which revealed severe diffuse multivessel CAD.  She was seen by CT surgery in September 2022 with arrangements for CABG in October 2022.  This unfortunate had to be canceled and the patient delayed rescheduling, but is now scheduled for follow-up tomorrow.  Fortunately, she has been feeling well without recent chest pain or dyspnea.  Recent echo in July showed an EF of 50 to 55%, which was improved from March 2022 (35-40% at that time).  She remains on aspirin, beta-blocker, statin, and Zetia therapy.  2.  Chronic heart failure with reviewed ejection fraction/ischemic cardiomyopathy: Echocardiogram in March 2022 showed an EF of 35 to 40% with global hypokinesis.  Repeat echo in July showed improvement in EF to 50-55% and only mild mitral regurgitation.  She is euvolemic on exam and has been feeling well.  She remains on beta-blocker, Entresto, spironolactone, and empagliflozin therapy with stable labs in May.  3.  Mitral regurgitation: Previously noted to be moderate by echo in March 2022, currently mild per echo in July.  4.  Hyperlipidemia: LDL of 95 in May, at which  time Zetia therapy was added on top of Lipitor 80 mg.  She has not had follow-up lipids since then and is not currently fasting.  We will look to follow-up in the future.  5.  Essential hypertension: Stable.  6.  Type 2 diabetes mellitus: A1c 7.9 last week.  This has significantly improved over the past year.  She remains on Victoza, [place], and [place], and is followed closely by primary care.  7.  Disposition: Patient to follow-up with CT surgery in the setting of known multivessel disease.  Plan  clinic follow-up in approximately 2 months - we can adjust this date to coincide w/ surgical plans.  Murray Hodgkins, NP 08/11/2022, 11:14 AM

## 2022-08-12 ENCOUNTER — Encounter: Payer: Self-pay | Admitting: Surgery

## 2022-08-12 ENCOUNTER — Ambulatory Visit (INDEPENDENT_AMBULATORY_CARE_PROVIDER_SITE_OTHER): Payer: 59 | Admitting: Surgery

## 2022-08-12 VITALS — BP 98/59 | HR 70 | Resp 18 | Ht 69.0 in | Wt 180.0 lb

## 2022-08-12 DIAGNOSIS — I2510 Atherosclerotic heart disease of native coronary artery without angina pectoris: Secondary | ICD-10-CM

## 2022-08-12 NOTE — Progress Notes (Signed)
    HPI: ***  Current Outpatient Medications  Medication Sig Dispense Refill   albuterol (VENTOLIN HFA) 108 (90 Base) MCG/ACT inhaler INHALE 2 PUFFS INTO THE LUNGS EVERY 6 HOURS AS NEEDED FOR WHEEZING OR SHORTNESS OF BREATH 6.7 g 0   amLODipine (NORVASC) 5 MG tablet Take 1 tablet (5 mg total) by mouth daily. 90 tablet 1   aspirin EC 81 MG tablet Take 1 tablet (81 mg total) by mouth daily. Swallow whole. 90 tablet 3   atorvastatin (LIPITOR) 80 MG tablet Take 1 tablet (80 mg total) by mouth daily. 90 tablet 1   blood glucose meter kit and supplies Dispense based on patient and insurance preference. Use up to four times daily as directed. (FOR ICD-10 E10.9, E11.9). 1 each 0   carvedilol (COREG) 12.5 MG tablet Take 1 tablet (12.5 mg total) by mouth 2 (two) times daily. 180 tablet 3   ezetimibe (ZETIA) 10 MG tablet Take 1 tablet (10 mg total) by mouth daily. 90 tablet 1   fluticasone (FLONASE) 50 MCG/ACT nasal spray Place 2 sprays into both nostrils daily. 16 g 6   insulin glargine-yfgn (SEMGLEE) 100 UNIT/ML Pen Inject 30-40 Units into the skin daily. 30 mL 2   JARDIANCE 25 MG TABS tablet Take 1 tablet (25 mg total) by mouth daily. 90 tablet 0   levocetirizine (XYZAL) 5 MG tablet Take 1 tablet (5 mg total) by mouth every evening. 90 tablet 1   liraglutide (VICTOZA) 18 MG/3ML SOPN Inject 1.8 mg into the skin daily. 15 mL 1   montelukast (SINGULAIR) 10 MG tablet Take 1 tablet (10 mg total) by mouth at bedtime. 90 tablet 1   Multiple Vitamins-Minerals (MULTIVITAMIN WITH MINERALS) tablet Take 1 tablet by mouth daily. 100 tablet 0   nitroGLYCERIN (NITROSTAT) 0.4 MG SL tablet Place 1 tablet (0.4 mg total) under the tongue every 5 (five) minutes as needed for chest pain. 25 tablet 3   sacubitril-valsartan (ENTRESTO) 24-26 MG Take 1 tablet by mouth 2 (two) times daily. 180 tablet 3   spironolactone (ALDACTONE) 25 MG tablet Take 1 tablet (25 mg total) by mouth daily. 90 tablet 3   No current  facility-administered medications for this visit.     Physical Exam: ***  Diagnostic Tests: ***  Impression: ***  Plan: ***   Gaye Pollack, MD [facility] [phone]

## 2022-08-14 ENCOUNTER — Ambulatory Visit: Payer: Self-pay

## 2022-08-14 NOTE — Telephone Encounter (Signed)
Called and left vm letting patient know to go to UC and not wait until next week. I also let patient know we will be out of office until Tuesday and we can return any calls then.

## 2022-08-14 NOTE — Telephone Encounter (Addendum)
Called pt - [facility] to schedule appt. For next week with Dr. Carlynn Purl.  Pt did not want to go UC.  Chief Complaint: Cloudy urine, pain with urination, vaginal itching Symptoms: ibid Frequency: 08/06/2022 Pertinent Negatives: Patient denies  Disposition: [] ED /[x] Urgent Care (no appt availability in office) / [] Appointment(In office/virtual)/ []  [place] [facility]/ [] Home Care/ [] Refused Recommended Disposition /[] [place] [facility]/ []  Follow-up with PCP Additional Notes: Pt states that this started 8/24, but did not mention to Dr. at [facility]. Pt states that her urine is cloudy and there is some pain with urination. Pt also states that she is having some vaginal itching. Pt thinks she has a yeast infection. Pt does not want to use OTC medications for yeast infection. Pt requested refill of diflucan, but was refused.    Summary: UTI Advice   Pt is calling to report that she saw Dr. on 08/06/22. Pt is reporting UTI with cloudy urine requesting a refill on diflucin. Please advise      Reason for Disposition  All other urine symptoms  Answer Assessment - Initial Assessment Questions 1. SYMPTOM: "What's the main symptom you're concerned about?" (e.g., frequency, incontinence)     Burning with urination, itching cloudy urine 2. ONSET: "When did the  symptoms  start?"     About the 24th 3. PAIN: "Is there any pain?" If Yes, ask: "How bad is it?" (Scale: 1-10; mild, moderate, severe)     Yes with urination - mild 4. CAUSE: "What do you think is causing the symptoms?"     UTI/ yeast infection 5. OTHER SYMPTOMS: "Do you have any other symptoms?" (e.g., blood in urine, fever, flank pain, pain with urination)     no 6. PREGNANCY: "Is there any chance you are pregnant?" "When was your last menstrual period?"     na  Protocols used: Urinary Symptoms-A-AH

## 2022-08-24 ENCOUNTER — Telehealth: Payer: Self-pay | Admitting: Family Medicine

## 2022-08-24 NOTE — Telephone Encounter (Signed)
JARDIANCE 25 MG TABS tablet 90 tablet 0 08/06/2022  Pt is concerned as she ran out of this med on Friday and the pharmacy states that until she gets a PA from Dr that they are unable to fill script. Please advise pt how to expidite or what to do as she is out of med. FU @ [phone]

## 2022-08-24 NOTE — Telephone Encounter (Signed)
PA archived for some reason. Completed and sent to insurance today.

## 2022-08-31 ENCOUNTER — Ambulatory Visit: Payer: 59 | Admitting: Internal Medicine

## 2022-08-31 ENCOUNTER — Encounter: Payer: Self-pay | Admitting: Internal Medicine

## 2022-08-31 VITALS — BP 118/70 | HR 68 | Ht 69.0 in | Wt 181.0 lb

## 2022-08-31 DIAGNOSIS — E1165 Type 2 diabetes mellitus with hyperglycemia: Secondary | ICD-10-CM

## 2022-08-31 DIAGNOSIS — Z794 Long term (current) use of insulin: Secondary | ICD-10-CM

## 2022-08-31 DIAGNOSIS — E11319 Type 2 diabetes mellitus with unspecified diabetic retinopathy without macular edema: Secondary | ICD-10-CM | POA: Diagnosis not present

## 2022-08-31 DIAGNOSIS — E119 Type 2 diabetes mellitus without complications: Secondary | ICD-10-CM | POA: Insufficient documentation

## 2022-08-31 DIAGNOSIS — E1142 Type 2 diabetes mellitus with diabetic polyneuropathy: Secondary | ICD-10-CM | POA: Diagnosis not present

## 2022-08-31 DIAGNOSIS — R809 Proteinuria, unspecified: Secondary | ICD-10-CM | POA: Insufficient documentation

## 2022-08-31 DIAGNOSIS — E1159 Type 2 diabetes mellitus with other circulatory complications: Secondary | ICD-10-CM | POA: Diagnosis not present

## 2022-08-31 DIAGNOSIS — E785 Hyperlipidemia, unspecified: Secondary | ICD-10-CM

## 2022-08-31 DIAGNOSIS — E1169 Type 2 diabetes mellitus with other specified complication: Secondary | ICD-10-CM

## 2022-08-31 LAB — POCT GLUCOSE (DEVICE FOR HOME USE): Glucose Fasting, POC: 127 mg/dL — AB (ref 70–99)

## 2022-08-31 MED ORDER — TOUJEO MAX SOLOSTAR 300 UNIT/ML ~~LOC~~ SOPN
100.0000 [IU] | PEN_INJECTOR | Freq: Every day | SUBCUTANEOUS | 3 refills | Status: DC
Start: 2022-08-31 — End: 2022-09-04

## 2022-08-31 MED ORDER — JARDIANCE 25 MG PO TABS: 25.0000 mg | ORAL_TABLET | Freq: Every day | ORAL | 2 refills | Status: DC

## 2022-08-31 MED ORDER — DEXCOM G7 SENSOR MISC: 1.0000 | 3 refills | Status: DC

## 2022-08-31 MED ORDER — TRULICITY 3 MG/0.5ML ~~LOC~~ SOAJ: 3.0000 mg | SUBCUTANEOUS | 2 refills | Status: DC

## 2022-08-31 NOTE — Progress Notes (Signed)
Name: Sarah Duffy  MRN/ DOB: [id number], 05/24/1971   Age/ Sex: 51 y.o., female    PCP: Steele Sizer, MD   Reason for Endocrinology Evaluation: Type 2 Diabetes Mellitus     Date of Initial Endocrinology Visit: 08/31/2022     PATIENT IDENTIFIER: Sarah Duffy is a 51 y.o. female with a past medical history of t2DM, Dyslipidemia, CAD, CHF. The patient presented for initial endocrinology clinic visit on 08/31/2022 for consultative assistance with her diabetes management.    HPI: Sarah Duffy was    Diagnosed with DM years ago  Prior Medications tried/Intolerance: Metformin, glipizide, switch to insulin in 2017 after a huge left foot infection . Was on prandial insulin at some point  Currently checking blood sugars 4-5 x / day Hypoglycemia episodes : no            Hemoglobin A1c has ranged from 7.9% in 2023, peaking at 13.0% in 2020. Patient has required hospitalization within the last 1 year from hyper or hypoglycemia: no  In terms of diet, the patient eats 2 -3 meals a day, snacks 4-5 a day   She is interested in CGM, used to be on freestyle libre   Has hx of left foot debridement  in 2017, had another infection and injury in 2021 while walking on a seashell   She works at [employer] for [employer]: Jardiance 25 mg daily  Victoza 1.8 mg daily  Semglee 100 units daily    Statin: yes ACE-I/ARB: yes    METER DOWNLOAD SUMMARY: Did not bring    DIABETIC COMPLICATIONS: Microvascular complications:  Retinopathy, neuropathy  Denies: CKD Last eye exam: Completed 2022  Macrovascular complications:  CAD Denies: PVD, CVA   PAST HISTORY: Past Medical History:  Past Medical History:  Diagnosis Date   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 02/2021 Echo: EF 35-40%, glob HK, GrI DD, nl RV fxn, mildly dil LA, mod MR; b. 06/2022 Echo: EF 50-55%, no rwma, GrII DD, nl RV fxn, RVSP 70mHg, mildly dil  LA, mild MR, AoV sclerosis.   Coronary artery disease    a. 04/2021 Cath: LM nl, LAD 70p/m, 671m80d, LCX 3071mM1 40, OM2 99 (fills via collats from OM1), RCA 60d, RPDA 75.   COVID-19 2021   Hyperlipidemia LDL goal <70    Hypertension    Ischemic cardiomyopathy    a. 02/2021 Echo: EF 35-40%, glob HK, GrI DD; b. 06/2022 Echo: EF 50-55%.   Moderate mitral regurgitation    a. 02/2021 Echo: Mod MR.   MRSA infection within last 3 months 02/25/2016   [place]   [place] II diabetes mellitus ([place]  Past Surgical History:  Past Surgical History:  Procedure Laterality Date   CHOLECYSTECTOMY  1999   COLONOSCOPY WITH PROPOFOL N/A 03/25/2021   Procedure: COLONOSCOPY WITH PROPOFOL;  Surgeon: AnnJonathon BellowsD;  [facility];  Service: Gastroenterology;  Laterality: N/A;   COLONOSCOPY WITH PROPOFOL N/A 04/17/2021   Procedure: COLONOSCOPY WITH PROPOFOL;  Surgeon: AnnJonathon BellowsD;  [facility];  Service: Gastroenterology;  Laterality: N/A;   ESOPHAGOGASTRODUODENOSCOPY (EGD) WITH PROPOFOL N/A 03/25/2021   Procedure: ESOPHAGOGASTRODUODENOSCOPY (EGD) WITH PROPOFOL;  Surgeon: AnnJonathon BellowsD;  [facility];  Service: Gastroenterology;  Laterality: N/A;   ESOPHAGOGASTRODUODENOSCOPY (EGD) WITH PROPOFOL N/A 04/17/2021   Procedure: ESOPHAGOGASTRODUODENOSCOPY (EGD) WITH PROPOFOL;  Surgeon: AnnJonathon BellowsD;  [facility];  Service: Gastroenterology;  Laterality: N/A;   RIGHT/LEFT HEART CATH AND CORONARY ANGIOGRAPHY Bilateral 04/14/2021   Procedure: RIGHT/LEFT HEART CATH AND CORONARY ANGIOGRAPHY;  Surgeon: Nelva Bush, MD;  Location: [place] CV LAB;  Service: Cardiovascular;  Laterality: Bilateral;   TOE SURGERY Left 02/07/2016   Pinky Toe    Social History:  reports that she has never smoked. She has never used smokeless tobacco. She reports that she does not drink alcohol and does not use drugs. Family History:  Family History   Problem Relation Age of Onset   Diabetes Mother    Ulcers Mother    Heart disease Father    AAA (abdominal aortic aneurysm) Father    Diabetes Father    Hypertension Father    Stroke Father    Alzheimer's disease Father    Heart attack Sister    Seizures Brother    Diabetes Maternal Grandmother    Breast cancer Maternal Grandmother      HOME MEDICATIONS: Allergies as of 08/31/2022   No Known Allergies      Medication List        Accurate as of August 31, 2022  9:59 AM. If you have any questions, ask your nurse or doctor.          albuterol 108 (90 Base) MCG/ACT inhaler Commonly known as: VENTOLIN HFA INHALE 2 PUFFS INTO THE LUNGS EVERY 6 HOURS AS NEEDED FOR WHEEZING OR SHORTNESS OF BREATH   amLODipine 5 MG tablet Commonly known as: NORVASC Take 1 tablet (5 mg total) by mouth daily.   aspirin EC 81 MG tablet Take 1 tablet (81 mg total) by mouth daily. Swallow whole.   atorvastatin 80 MG tablet Commonly known as: LIPITOR Take 1 tablet (80 mg total) by mouth daily.   blood glucose meter kit and supplies Dispense based on patient and insurance preference. Use up to four times daily as directed. (FOR ICD-10 E10.9, E11.9).   carvedilol 12.5 MG tablet Commonly known as: COREG Take 1 tablet (12.5 mg total) by mouth 2 (two) times daily.   Entresto 24-26 MG Generic drug: sacubitril-valsartan Take 1 tablet by mouth 2 (two) times daily.   ezetimibe 10 MG tablet Commonly known as: Zetia Take 1 tablet (10 mg total) by mouth daily.   fluticasone 50 MCG/ACT nasal spray Commonly known as: FLONASE Place 2 sprays into both nostrils daily.   insulin glargine-yfgn 100 UNIT/ML Pen Commonly known as: SEMGLEE Inject 30-40 Units into the skin daily.   Jardiance 25 MG Tabs tablet Generic drug: empagliflozin Take 1 tablet (25 mg total) by mouth daily.   levocetirizine 5 MG tablet Commonly known as: XYZAL Take 1 tablet (5 mg total) by mouth every evening.    liraglutide 18 MG/3ML Sopn Commonly known as: VICTOZA Inject 1.8 mg into the skin daily.   montelukast 10 MG tablet Commonly known as: SINGULAIR Take 1 tablet (10 mg total) by mouth at bedtime.   multivitamin with minerals tablet Take 1 tablet by mouth daily.   nitroGLYCERIN 0.4 MG SL tablet Commonly known as: Nitrostat Place 1 tablet (0.4 mg total) under the tongue every 5 (five) minutes as needed for chest pain.   spironolactone 25 MG tablet Commonly known as: ALDACTONE Take 1 tablet (25 mg total) by mouth daily.         ALLERGIES: No Known Allergies   REVIEW OF SYSTEMS: A comprehensive ROS was conducted with the patient and is negative except as per HPI and below:  Review  of Systems  Gastrointestinal:  Negative for diarrhea, nausea and vomiting.      OBJECTIVE:   VITAL SIGNS: Ht 5' 9"  (1.753 m)   Wt 181 lb (82.1 kg)   BMI 26.73 kg/m    PHYSICAL EXAM:  General: Pt appears well and is in NAD  Neck: General: Supple without adenopathy or carotid bruits. Thyroid: Thyroid size normal.  No goiter or nodules appreciated.   Lungs: Clear with good BS bilat with no rales, rhonchi, or wheezes  Heart: RRR   Abdomen:  soft, nontender  Extremities:  Lower extremities - No pretibial edema. No lesions.  Skin: Normal texture and temperature to palpation. No rash noted.  Neuro: MS is good with appropriate affect, pt is alert and Ox3    DM foot exam: 08/31/2022  The skin of the feet is intact without sores or ulcerations. The pedal pulses are 2+ on right and 2+ on left. The sensation is absent to a screening 5.07, 10 gram monofilament bilaterally   DATA REVIEWED:  Lab Results  Component Value Date   HGBA1C 7.9 (A) 08/06/2022   HGBA1C 8.1 (H) 05/01/2022   HGBA1C 10.1 (A) 01/05/2022   Lab Results  Component Value Date   MICROALBUR 11.1 05/01/2022   LDLCALC 95 05/01/2022   CREATININE 1.05 (H) 05/01/2022   Lab Results  Component Value Date   MICRALBCREAT 258  (H) 05/01/2022    Lab Results  Component Value Date   CHOL 152 05/01/2022   HDL 38 (L) 05/01/2022   LDLCALC 95 05/01/2022   TRIG 99 05/01/2022   CHOLHDL 4.0 05/01/2022        ASSESSMENT / PLAN / RECOMMENDATIONS:   1) Type 2 Diabetes Mellitus, Sub-Optimally controlled, With retinopathic, neuropathic and macrovascular   complications - Most recent A1c of 7.9 %. Goal A1c < 7.0 %.    I have discussed with the patient the pathophysiology of diabetes. We went over the natural progression of the disease. We stressed the importance of lifestyle changes. I explained the complications associated with diabetes including retinopathy, nephropathy, neuropathy as well as increased risk of cardiovascular disease. We went over the benefit seen with glycemic control.   I explained to the patient that diabetic patients are at higher than normal risk for amputations.  Patient will be referred to our CDE for low-carb diet education.  Unfortunately she works at [employer] and tends to consume more carbohydrates than she should add snacks I will try switching Basaglar to a more concentrated insulin I will also switch the Victoza to Trulicity, that way she can take this weekly A prescription of Dexcom was sent to her pharmacy She is to be on metformin without any intolerance reported, we will consider metformin in the future after her heart surgery  MEDICATIONS: Switch Basaglar to Toujeo 100 units daily Stop Victoza Start Trulicity 3 mg once weekly Continue Jardiance 25 mg daily  EDUCATION / INSTRUCTIONS: BG monitoring instructions: Patient is instructed to check her blood sugars 3 times a day, before each meal Call [facility] if: BG persistently < 70  I reviewed the Rule of 15 for the treatment of hypoglycemia in detail with the patient. Literature supplied.   2) Diabetic complications:  Eye: Does  have known diabetic retinopathy.  Neuro/ Feet: Does  have known diabetic peripheral  neuropathy. Renal: Patient does not have known baseline CKD. She is  on an ACEI/ARB at present.          Signed electronically by: Maretta Bees  Nena Jordan, MD  [facility] End[facility]  [facility] [address] [place], [place] [address] Phone: [phone] FAX: [phone]   CC: Steele Sizer, [address] [place] [address] Phone: [phone]  Fax: [phone]    Return to Endocrinology clinic as below: Future Appointments  Date Time Provider [place]  08/31/2022 10:10 AM Jatoya Armbrister, Melanie Crazier, MD [facility] None  10/12/2022 10:00 AM Kate Sable, MD [facility] None  10/29/2022 10:00 AM Steele Sizer, MD [place] [facility]  05/11/2023  8:00 AM Steele Sizer, MD [place] [facility]

## 2022-08-31 NOTE — Patient Instructions (Addendum)
I will try switching Basaglar to [employer], you continue to take 100 units once daily  Stop Victoza  Start Trulicity 3 mg ONCE weekly  Continue Jardiance 25 mg daily     HOW TO TREAT LOW BLOOD SUGARS (Blood sugar LESS THAN 70 MG/DL) Please follow the RULE OF 15 for the treatment of hypoglycemia treatment (when your (blood sugars are less than 70 mg/dL)   STEP 1: Take 15 grams of carbohydrates when your blood sugar is low, which includes:  3-4 GLUCOSE TABS  OR 3-4 OZ OF JUICE OR REGULAR SODA OR ONE TUBE OF GLUCOSE GEL    STEP 2: RECHECK blood sugar in 15 MINUTES STEP 3: If your blood sugar is still low at the 15 minute recheck --> then, go back to STEP 1 and treat AGAIN with another 15 grams of carbohydrates.

## 2022-09-01 ENCOUNTER — Other Ambulatory Visit (HOSPITAL_COMMUNITY): Payer: Self-pay

## 2022-09-02 ENCOUNTER — Telehealth: Payer: Self-pay

## 2022-09-02 ENCOUNTER — Other Ambulatory Visit (HOSPITAL_COMMUNITY): Payer: Self-pay

## 2022-09-02 NOTE — Telephone Encounter (Addendum)
Prior Authorization for Trulicity 3MG /0.5ML pen-injectors has been approved.   Approved from 09/01/2022 to 09/02/2023  Key: [id number]

## 2022-09-03 ENCOUNTER — Telehealth: Payer: Self-pay

## 2022-09-03 NOTE — Telephone Encounter (Signed)
Patient would like to switch to Ozempic because Toujeo was $2000 dollars and she can't afford that. She can get Ozempic for $25.

## 2022-09-04 MED ORDER — BASAGLAR KWIKPEN 100 UNIT/ML ~~LOC~~ SOPN: 100.0000 [IU] | PEN_INJECTOR | Freq: Every day | SUBCUTANEOUS | 6 refills | Status: DC

## 2022-09-04 NOTE — Telephone Encounter (Signed)
Patient notified and will pick up the [facility]

## 2022-09-04 NOTE — Telephone Encounter (Signed)
LMTCB

## 2022-10-12 ENCOUNTER — Encounter: Payer: Self-pay | Admitting: Cardiology

## 2022-10-12 ENCOUNTER — Ambulatory Visit: Payer: 59 | Attending: Cardiology | Admitting: Cardiology

## 2022-10-12 VITALS — BP 128/72 | HR 68 | Ht 69.0 in | Wt 177.4 lb

## 2022-10-12 DIAGNOSIS — I10 Essential (primary) hypertension: Secondary | ICD-10-CM | POA: Diagnosis not present

## 2022-10-12 DIAGNOSIS — I2510 Atherosclerotic heart disease of native coronary artery without angina pectoris: Secondary | ICD-10-CM | POA: Diagnosis not present

## 2022-10-12 DIAGNOSIS — I255 Ischemic cardiomyopathy: Secondary | ICD-10-CM | POA: Diagnosis not present

## 2022-10-12 MED ORDER — LOSARTAN POTASSIUM 50 MG PO TABS: 50.0000 mg | ORAL_TABLET | Freq: Every day | ORAL | 3 refills | Status: DC

## 2022-10-12 MED ORDER — SPIRONOLACTONE 25 MG PO TABS: 25.0000 mg | ORAL_TABLET | Freq: Every day | ORAL | 3 refills | Status: DC

## 2022-10-12 NOTE — Patient Instructions (Signed)
Medication Instructions:   Your physician has recommended you make the following change in your medication:    STOP taking your Entresto.  2.    STOP taking your Amlodipine (Norvasc).  3.    START taking Losartan 50 MG once a day.   *If you need a refill on your cardiac medications before your next appointment, please call your pharmacy*     Follow-Up: At [facility], you and your health needs are our priority.  As part of our continuing mission to provide you with exceptional heart care, we have created designated Provider Care Teams.  These Care Teams include your primary Cardiologist (physician) and Advanced Practice Providers (APPs -  Physician Assistants and Nurse Practitioners) who all work together to provide you with the care you need, when you need it.  We recommend signing up for the patient portal called "MyChart".  Sign up information is provided on this After Visit Summary.  MyChart is used to connect with patients for Virtual Visits (Telemedicine).  Patients are able to view lab/test results, encounter notes, upcoming appointments, etc.  Non-urgent messages can be sent to your provider as well.   To learn more about what you can do with MyChart, go to [email or web address].    Your next appointment:   3 month(s)  The format for your next appointment:   In Person  Provider:    ONLY WITH Kate Sable, MD    Other Instructions   Important Information About Sugar

## 2022-10-12 NOTE — Progress Notes (Signed)
Cardiology Office Note:    Date:  10/12/2022   ID:  Sarah Duffy, DOB [birth year], MRN [id number]  PCP:  Steele Sizer, MD   [place]  Cardiologist:  Kate Sable, MD  Advanced Practice Provider:  No care team member to display Electrophysiologist:  None       Referring MD: Steele Sizer, MD   Chief Complaint  Patient presents with   Follow-up    Coronary Arterty, no new cardiac concerns     History of Present Illness:    Sarah Duffy is a 51 y.o. female with a hx of ischemic cardiomyopathy, initial EF 35 to 40%, (last EF 50 to 55% ), multivessel CAD (80% mLAD, 99% OM 2, 7 to 80% pRCA.) hypertension, hyperlipidemia who presents for follow-up.   She saw CT surgeon, coronary artery bypass recommended.  She is switched her insurance, wants to make sure her insurance is able to cover medical costs.  Not able to afford Entresto on current insurance.  Overall feels okay, last echocardiogram showed improvement in ejection fraction.  Prior notes Echo 03/12/2021 EF 35 to 40%, moderate MR Left heart cath 04/14/2021 multivessel CAD, 80% mLAD, 99% OM 2, 70 to 80% pRCA.  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 02/2021 Echo: EF 35-40%, glob HK, GrI DD, nl RV fxn, mildly dil LA, mod MR; b. 06/2022 Echo: EF 50-55%, no rwma, GrII DD, nl RV fxn, RVSP 53mHg, mildly dil LA, mild MR, AoV sclerosis.   Coronary artery disease    a. 04/2021 Cath: LM nl, LAD 70p/m, 62m80d, LCX 3023mM1 40, OM2 99 (fills via collats from OM1), RCA 60d, RPDA 75.   COVID-19 2021   Hyperlipidemia LDL goal <70    Hypertension    Ischemic cardiomyopathy    a. 02/2021 Echo: EF 35-40%, glob HK, GrI DD; b. 06/2022 Echo: EF 50-55%.   Moderate mitral regurgitation    a. 02/2021 Echo: Mod MR.   MRSA infection within last 3 months 02/25/2016   Osteomyelitis of foot ([place]/13/2017   Type II diabetes mellitus ([facility]    Past Surgical History:  Procedure Laterality Date   CHOLECYSTECTOMY  1999   COLONOSCOPY WITH PROPOFOL N/A 03/25/2021   Procedure: COLONOSCOPY WITH PROPOFOL;  Surgeon: AnnJonathon BellowsD;  [facility];  Service: Gastroenterology;  Laterality: N/A;   COLONOSCOPY WITH PROPOFOL N/A 04/17/2021   Procedure: COLONOSCOPY WITH PROPOFOL;  Surgeon: AnnJonathon BellowsD;  [facility];  Service: Gastroenterology;  Laterality: N/A;   ESOPHAGOGASTRODUODENOSCOPY (EGD) WITH PROPOFOL N/A 03/25/2021   Procedure: ESOPHAGOGASTRODUODENOSCOPY (EGD) WITH PROPOFOL;  Surgeon: AnnJonathon BellowsD;  [facility];  Service: Gastroenterology;  Laterality: N/A;   ESOPHAGOGASTRODUODENOSCOPY (EGD) WITH PROPOFOL N/A 04/17/2021   Procedure: ESOPHAGOGASTRODUODENOSCOPY (EGD) WITH PROPOFOL;  Surgeon: AnnJonathon BellowsD;  [facility];  Service: Gastroenterology;  Laterality: N/A;   RIGHT/LEFT HEART CATH AND CORONARY ANGIOGRAPHY Bilateral 04/14/2021   Procedure: RIGHT/LEFT HEART CATH AND CORONARY ANGIOGRAPHY;  Surgeon: EndNelva BushD;  [facility];  Service: Cardiovascular;  Laterality: Bilateral;   TOE SURGERY Left 02/07/2016   Pinky Toe    Current Medications: Current Meds  Medication Sig   albuterol (VENTOLIN HFA) 108 (90 Base) MCG/ACT inhaler INHALE 2 PUFFS INTO THE LUNGS EVERY 6 HOURS AS NEEDED FOR WHEEZING OR SHORTNESS OF BREATH   aspirin EC 81 MG tablet Take 1 tablet (81 mg  total) by mouth daily. Swallow whole.   atorvastatin (LIPITOR) 80 MG tablet Take 1 tablet (80 mg total) by mouth daily.   blood glucose meter kit and supplies Dispense based on patient and insurance preference. Use up to four times daily as directed. (FOR ICD-10 E10.9, E11.9).   carvedilol (COREG) 12.5 MG tablet Take 1 tablet (12.5 mg total) by mouth 2 (two) times daily.   Continuous Blood Gluc Sensor (DEXCOM G7 SENSOR) MISC 1 Device by Does not apply route as directed.   Dulaglutide (TRULICITY)  3 RX/5.4MG SOPN Inject 3 mg as directed once a week. To replace victoza   ezetimibe (ZETIA) 10 MG tablet Take 1 tablet (10 mg total) by mouth daily.   fluticasone (FLONASE) 50 MCG/ACT nasal spray Place 2 sprays into both nostrils daily.   Insulin Glargine (BASAGLAR KWIKPEN) 100 UNIT/ML Inject 100 Units into the skin daily.   JARDIANCE 25 MG TABS tablet Take 1 tablet (25 mg total) by mouth daily.   levocetirizine (XYZAL) 5 MG tablet Take 1 tablet (5 mg total) by mouth every evening.   losartan (COZAAR) 50 MG tablet Take 1 tablet (50 mg total) by mouth daily.   montelukast (SINGULAIR) 10 MG tablet Take 1 tablet (10 mg total) by mouth at bedtime.   Multiple Vitamins-Minerals (MULTIVITAMIN WITH MINERALS) tablet Take 1 tablet by mouth daily.   nitroGLYCERIN (NITROSTAT) 0.4 MG SL tablet Place 1 tablet (0.4 mg total) under the tongue every 5 (five) minutes as needed for chest pain.   [DISCONTINUED] amLODipine (NORVASC) 5 MG tablet Take 1 tablet (5 mg total) by mouth daily.   [DISCONTINUED] sacubitril-valsartan (ENTRESTO) 24-26 MG Take 1 tablet by mouth 2 (two) times daily.   [DISCONTINUED] spironolactone (ALDACTONE) 25 MG tablet Take 1 tablet (25 mg total) by mouth daily.     Allergies:   Patient has no known allergies.   Social History   Socioeconomic History   Marital status: Single    Spouse name: Not on file   Number of children: 1   Years of education: Not on file   Highest education level: Some college, no degree  Occupational History   Occupation: Public affairs consultant   Tobacco Use   Smoking status: Never   Smokeless tobacco: Never  Vaping Use   Vaping Use: Never used  Substance and Sexual Activity   Alcohol use: No    Alcohol/week: 0.0 standard drinks of alcohol   Drug use: No   Sexual activity: Yes    Partners: Male    Birth control/protection: Injection  Other Topics Concern   Not on file  Social History Narrative   She used to work at [employer], but  Feb 2017 she left  work because of  MRSA infection.osteomyelitis and uncontrolled DM. She has been back to work since Feb 2018   Lives alone   Social Determinants of Health   Financial Resource Strain: Medium Risk (05/01/2022)   Overall Financial Resource Strain (CARDIA)    Difficulty of Paying Living Expenses: Somewhat hard  Food Insecurity: Food Insecurity Present (05/01/2022)   Hunger Vital Sign    Worried About Running Out of Food in the Last Year: Often true    Ran Out of Food in the Last Year: Often true  Transportation Needs: Unmet Transportation Needs (05/01/2022)   PRAPARE - Hydrologist (Medical): Yes    Lack of Transportation (Non-Medical): Yes  Physical Activity: Insufficiently Active (05/01/2022)   Exercise Vital Sign  Days of Exercise per Week: 3 days    Minutes of Exercise per Session: 30 min  Stress: No Stress Concern Present (05/01/2022)   [place]    Feeling of Stress : Only a little  Social Connections: Socially Isolated (05/01/2022)   Social Connection and Isolation Panel [NHANES]    Frequency of Communication with Friends and Family: Once a week    Frequency of Social Gatherings with Friends and Family: Once a week    Attends Religious Services: More than 4 times per year    Active Member of [employer] or Organizations: No    Attends Music therapist: Never    Marital Status: Never married     Family History: The patient's family history includes AAA (abdominal aortic aneurysm) in her father; Alzheimer's disease in her father; Breast cancer in her maternal grandmother; Diabetes in her father, maternal grandmother, and mother; Heart attack in her sister; Heart disease in her father; Hypertension in her father; Seizures in her brother; Stroke in her father; Ulcers in her mother.  ROS:   Please see the history of present illness.     All other systems reviewed and are  negative.  EKGs/Labs/Other Studies Reviewed:    The following studies were reviewed today:   EKG:  EKG not ordered today.  Recent Labs: 05/01/2022: ALT 19; BUN 18; Creat 1.05; Hemoglobin 10.5; Platelets 235; Potassium 4.5; Sodium 140  Recent Lipid Panel    Component Value Date/Time   CHOL 152 05/01/2022 0948   CHOL 208 (H) 02/28/2016 0951   TRIG 99 05/01/2022 0948   HDL 38 (L) 05/01/2022 0948   HDL 42 02/28/2016 0951   CHOLHDL 4.0 05/01/2022 0948   LDLCALC 95 05/01/2022 0948     Risk Assessment/Calculations:      Physical Exam:    VS:  BP 128/72 (BP Location: Left Arm, Patient Position: Sitting, Cuff Size: Normal)   Pulse 68   Ht _0  (1.753 m)   Wt 177 lb 6.4 oz (80.5 kg)   SpO2 99%   BMI 26.20 kg/m     Wt Readings from Last 3 Encounters:  10/12/22 177 lb 6.4 oz (80.5 kg)  08/31/22 181 lb (82.1 kg)  08/12/22 180 lb (81.6 kg)     GEN:  Well nourished, well developed in no acute distress HEENT: Normal NECK: No JVD; No carotid bruits CARDIAC: RRR, no murmurs, rubs, gallops RESPIRATORY:  Clear to auscultation without rales, wheezing or rhonchi  ABDOMEN: Soft, non-tender, non-distended MUSCULOSKELETAL:  no edema; No deformity  SKIN: Warm and dry NEUROLOGIC:  Alert and oriented x 3 PSYCHIATRIC:  Normal affect   ASSESSMENT:    1. Coronary artery disease involving native coronary artery of native heart without angina pectoris   2. Ischemic cardiomyopathy   3. Primary hypertension      PLAN:    In order of problems listed above:  Multivessel CAD, denies chest pain.  Continue aspirin 81 mg, Lipitor 40 mg daily.  Surgery recommended by CT surgery, patient to let CT surgery know when able to proceed.  CABG scheduling as per CT surgery pending patient insurance. Ischemic cardiomyopathy, EF 35 to 40%.  Last EF 50 to 55%.  Describes NYHA class II symptoms.  Cannot afford Entresto.  Start losartan 50 mg daily, continue Coreg 12.5 mg twice daily,Aldactone,  Jardiance.  Restart Entresto when able. Hypertension, BP controlled.  Losartan, Coreg, Aldactone as above.   Follow-up in 3 months  Medication Adjustments/Labs and Tests Ordered: Current medicines are reviewed at length with the patient today.  Concerns regarding medicines are outlined above.  Orders Placed This Encounter  Procedures   EKG 12-Lead     Meds ordered this encounter  Medications   spironolactone (ALDACTONE) 25 MG tablet    Sig: Take 1 tablet (25 mg total) by mouth daily.    Dispense:  90 tablet    Refill:  3   losartan (COZAAR) 50 MG tablet    Sig: Take 1 tablet (50 mg total) by mouth daily.    Dispense:  30 tablet    Refill:  3      Patient Instructions  Medication Instructions:   Your physician has recommended you make the following change in your medication:    STOP taking your Entresto.  2.    STOP taking your Amlodipine (Norvasc).  3.    START taking Losartan 50 MG once a day.   *If you need a refill on your cardiac medications before your next appointment, please call your pharmacy*     Follow-Up: At [facility], you and your health needs are our priority.  As part of our continuing mission to provide you with exceptional heart care, we have created designated Provider Care Teams.  These Care Teams include your primary Cardiologist (physician) and Advanced Practice Providers (APPs -  Physician Assistants and Nurse Practitioners) who all work together to provide you with the care you need, when you need it.  We recommend signing up for the patient portal called "MyChart".  Sign up information is provided on this After Visit Summary.  MyChart is used to connect with patients for Virtual Visits (Telemedicine).  Patients are able to view lab/test results, encounter notes, upcoming appointments, etc.  Non-urgent messages can be sent to your provider as well.   To learn more about what you can do with MyChart, go to [email or web address].     Your next appointment:   3 month(s)  The format for your next appointment:   In Person  Provider:    ONLY WITH Kate Sable, MD    Other Instructions   Important Information About Sugar         Signed, Kate Sable, MD  10/12/2022 12:26 PM    [place]

## 2022-10-25 ENCOUNTER — Emergency Department
Admission: EM | Admit: 2022-10-25 | Discharge: 2022-10-25 | Disposition: A | Discharge status: Home / Self Care | Payer: 59 | Attending: Emergency Medicine | Admitting: Emergency Medicine

## 2022-10-25 ENCOUNTER — Emergency Department: Payer: 59

## 2022-10-25 ENCOUNTER — Encounter: Payer: Self-pay | Admitting: Radiology

## 2022-10-25 DIAGNOSIS — I2510 Atherosclerotic heart disease of native coronary artery without angina pectoris: Secondary | ICD-10-CM | POA: Insufficient documentation

## 2022-10-25 DIAGNOSIS — R0602 Shortness of breath: Secondary | ICD-10-CM | POA: Diagnosis not present

## 2022-10-25 DIAGNOSIS — R079 Chest pain, unspecified: Secondary | ICD-10-CM

## 2022-10-25 DIAGNOSIS — R5381 Other malaise: Secondary | ICD-10-CM | POA: Diagnosis not present

## 2022-10-25 DIAGNOSIS — E119 Type 2 diabetes mellitus without complications: Secondary | ICD-10-CM | POA: Diagnosis not present

## 2022-10-25 DIAGNOSIS — Z8616 Personal history of COVID-19: Secondary | ICD-10-CM | POA: Diagnosis not present

## 2022-10-25 DIAGNOSIS — I10 Essential (primary) hypertension: Secondary | ICD-10-CM | POA: Insufficient documentation

## 2022-10-25 DIAGNOSIS — R0789 Other chest pain: Secondary | ICD-10-CM | POA: Diagnosis not present

## 2022-10-25 LAB — CBC
HCT: 31.4 % — ABNORMAL LOW (ref 36.0–46.0)
Hemoglobin: 10.3 g/dL — ABNORMAL LOW (ref 12.0–15.0)
MCH: 29 pg (ref 26.0–34.0)
MCHC: 32.8 g/dL (ref 30.0–36.0)
MCV: 88.5 fL (ref 80.0–100.0)
Platelets: 256 10*3/uL (ref 150–400)
RBC: 3.55 MIL/uL — ABNORMAL LOW (ref 3.87–5.11)
RDW: 12.9 % (ref 11.5–15.5)
WBC: 9.2 10*3/uL (ref 4.0–10.5)
nRBC: 0 % (ref 0.0–0.2)

## 2022-10-25 LAB — POC URINE PREG, ED: Preg Test, Ur: NEGATIVE

## 2022-10-25 LAB — BASIC METABOLIC PANEL
Anion gap: 7 (ref 5–15)
BUN: 23 mg/dL — ABNORMAL HIGH (ref 6–20)
CO2: 21 mmol/L — ABNORMAL LOW (ref 22–32)
Calcium: 9 mg/dL (ref 8.9–10.3)
Chloride: 110 mmol/L (ref 98–111)
Creatinine, Ser: 1.18 mg/dL — ABNORMAL HIGH (ref 0.44–1.00)
GFR, Estimated: 56 mL/min — ABNORMAL LOW (ref >60)
Glucose, Bld: 116 mg/dL — ABNORMAL HIGH (ref 70–99)
Potassium: 4.5 mmol/L (ref 3.5–5.1)
Sodium: 138 mmol/L (ref 135–145)

## 2022-10-25 LAB — TROPONIN I (HIGH SENSITIVITY)
Troponin I (High Sensitivity): 6 ng/L (ref <18)
Troponin I (High Sensitivity): 7 ng/L (ref <18)

## 2022-10-25 MED ORDER — FAMOTIDINE 20 MG PO TABS
40.0000 mg | ORAL_TABLET | Freq: Once | ORAL | Status: AC
  Administered 2022-10-25: 40 mg via ORAL
  Filled 2022-10-25: qty 2

## 2022-10-25 MED ORDER — ALUM & MAG HYDROXIDE-SIMETH 200-200-20 MG/5ML PO SUSP
30.0000 mL | Freq: Once | ORAL | Status: AC
  Administered 2022-10-25: 30 mL via ORAL
  Filled 2022-10-25: qty 30

## 2022-10-25 NOTE — ED Triage Notes (Signed)
Per pt she started to have CP and SOB at work today. Pt sts that her heart feels like it is racing.

## 2022-10-25 NOTE — Discharge Instructions (Addendum)
Your EKG, chest x-ray, and lab tests were all okay today.  Continue taking Maalox and Pepcid as needed to control symptoms, and follow-up with your doctor.

## 2022-10-25 NOTE — ED Provider Notes (Signed)
[facility] Provider Note    Event Date/Time   First MD Initiated Contact with Patient 10/25/22 2138     (approximate)   History   Chief Complaint: Chest Pain   HPI  Sarah Duffy is a 51 y.o. female with a history of hypertension, diabetes, long COVID, CAD who comes ED complaining of malaise throughout today after waking up this morning.  She had intermittent episodes of burning chest discomfort in the anterior chest throughout the day which was off-and-on, no aggravating or alleviating factors.  No shortness of breath diaphoresis vomiting or radiation.  No exertional symptoms.  She notes that she did not eat lunch throughout the day due to being overly busy at work.  Also notes that her grandchild stayed with her yesterday who was showing signs of viral illness.  His daycare has also had a viral outbreak recently.      Physical Exam   Triage Vital Signs: ED Triage Vitals [10/25/22 1954]  Enc Vitals Group     BP (!) 169/85     Pulse Rate 71     Resp 18     Temp 98.1 F (36.7 C)     Temp Source Oral     SpO2 98 %     Weight 177 lb (80.3 kg)     Height      Head Circumference      Peak Flow      Pain Score 5     Pain Loc      Pain Edu?      Excl. in GC?     Most recent vital signs: Vitals:   10/25/22 1954 10/25/22 2220  BP: (!) 169/85 (!) 173/85  Pulse: 71 70  Resp: 18 13  Temp: 98.1 F (36.7 C) 98 F (36.7 C)  SpO2: 98% 100%    General: Awake, no distress.  CV:  Good peripheral perfusion.  Regular rate and rhythm.  Normal distal pulses.  Loud systolic murmur. Resp:  Normal effort.  Clear to auscultation bilaterally Abd:  No distention.  Nontender Other:  No lower extremity edema.  Moist oral mucosa.   ED Results / Procedures / Treatments   Labs (all labs ordered are listed, but only abnormal results are displayed) Labs Reviewed  BASIC METABOLIC PANEL - Abnormal; Notable for the following components:      Result Value    CO2 21 (*)    Glucose, Bld 116 (*)    BUN 23 (*)    Creatinine, Ser 1.18 (*)    GFR, Estimated 56 (*)    All other components within normal limits  CBC - Abnormal; Notable for the following components:   RBC 3.55 (*)    Hemoglobin 10.3 (*)    HCT 31.4 (*)    All other components within normal limits  POC URINE PREG, ED  TROPONIN I (HIGH SENSITIVITY)  TROPONIN I (HIGH SENSITIVITY)     EKG Interpreted by me Sinus rhythm, rate of 83.  Left axis, normal intervals.  Poor R wave progression.  Normal ST segments and T waves.  3 PVCs on the strip.  No acute ischemic changes.   RADIOLOGY Chest x-ray interpreted by me, appears normal.  Radiology report reviewed.   PROCEDURES:  Procedures   MEDICATIONS ORDERED IN ED: Medications  alum & mag hydroxide-simeth (MAALOX/MYLANTA) 200-200-20 MG/5ML suspension 30 mL (has no administration in time range)  famotidine (PEPCID) tablet 40 mg (has no administration in time range)  IMPRESSION / MDM / ASSESSMENT AND PLAN / ED COURSE  I reviewed the triage vital signs and the nursing notes.                              Differential diagnosis includes, but is not limited to, GERD, viral illness, non-STEMI, anxiety, pneumonia  Patient's presentation is most consistent with acute presentation with potential threat to life or bodily function.  Patient presents with nonspecific chest pain, unlikely to be cardiac.  Doubt PE or dissection or pericardial effusion.  Due to her comorbidities, work-up was undertaken with EKG chest x-ray labs including serial troponins.  This was all unremarkable.  I suspect this is related to GERD or oncoming viral illness most likely.  Treat supportively with antacids, stable for outpatient follow-up.       FINAL CLINICAL IMPRESSION(S) / ED DIAGNOSES   Final diagnoses:  Nonspecific chest pain     Rx / DC Orders   ED Discharge Orders     None        Note:  This document was prepared using Dragon  voice recognition software and may include unintentional dictation errors.   Sharman Cheek, MD 10/25/22 2328

## 2022-10-28 NOTE — Progress Notes (Unsigned)
Name: Sarah Duffy   MRN: [id number]    DOB: May 17, 1971   Date:10/29/2022       Progress Note  Subjective  Chief Complaint  Follow Up  HPI  DMII:recent seen by Endocrinologist and the plan is to continue basal insulin but to increase to 100 units titrating up slowly, continue Jardiance and switching from Victoza to Trulicity 3 mg, she is waiting for insurance approval and currently still on Victoza . She denies polyphagia, polyuria or polydipsia. She also takes statin therapy and ARB. She has associated dyslipidemia, polyneuropathy, CAD  MDD:  She lost her mother unexpectedly on Feb 4 th , 20.  she does not want to resume medications at this time . She is in remission, phq 9 is negative Unchanged   HTN: bp is at goal today, she had some chest pain this past weekend but pain resolved by the time she went to [facility], no sob.   CAD/Chronic systolic heart failure/Cardiomyopathy: under the care of Dr. Mylo Red, she went for second opinion at [facility] and also recommended CABG , she will has a follow up with cardiovascular surgeon Dr. Cyndia Bent in [place] to discuss , she was ready to have CABG on 09/19/2021 but it was cancelled by the physician due to personal problems. She denies any recent chest pain, taking medication daily . She has ischemic cardiomyopathy but currently no symptoms. Denies orthopnea, PND or lower extremity edema she has some SOB with activity. She has a recent visit with cardiologist and is still on medical management , waiting to see if new insurance will cover her CABG. She was not feeling well this past weekend and went to [facility] with chest pain , she had negative troponin, mild anemia that is stable, mild CKI also stable. She was given GI cocktail and sent home,  she is still feeling tired.   Conclusions Cath :   Multivessel coronary artery disease including mild to moderate diffuse plaquing of the LAD and RCA.  The mid/distal LAD demonstrates 3 focal lesions of up to 80% that are  hemodynamically significant by iFR (iFR = 0.71).  There is also a 99% stenosis of OM2 with faint left to left collaterals supplying the distal branch as well as sequential 60% distal RCA and 70-80% proximal RPDA lesions.   IMPRESSIONS  Echo done 06/23/2022 showed improvement   Left ventricular ejection fraction, by estimation, is 50 to 55%. Left ventricular ejection fraction by 3D volume is 52 %. Left ventricular ejection fraction by PLAX is 53 %. The left ventricle has low normal function. The left ventricle has no regional wall motion abnormalities. Left ventricular diastolic parameters are consistent with Grade II diastolic dysfunction (pseudonormalization). The average left ventricular global longitudinal strain is -18.9 %. 1. Right ventricular systolic function is normal. The right ventricular size is normal. There is normal pulmonary artery systolic pressure. The estimated right ventricular systolic pressure is 10.2 mmHg. 2. 3. Left atrial size was mildly dilated. The mitral valve is normal in structure. Mild mitral valve regurgitation. No evidence of mitral stenosis. 4. The aortic valve is normal in structure. Aortic valve regurgitation is not visualized. Aortic valve sclerosis is present, with no evidence of aortic valve stenosis. 5. The inferior vena cava is normal in size with greater than 50% respiratory variability, suggesting right atrial pressure of 3 mmHg. 6. Comparison(s): Previous GLS reported as -13.5 Previous LVEF reported as 35-40%.  Fever blister: breaking out on the bottom of her lip, painful, we will give her valtrex today  Viral illness: fatigue, hoarseness, post-nasal drainage, nasal congestion   Patient Active Problem List   Diagnosis Date Noted  . Type 2 diabetes mellitus with diabetic polyneuropathy, with long-term current use of insulin ([place]) 08/31/2022  . Type 2 diabetes mellitus with retinopathy, with long-term current use of insulin ([place])  08/31/2022  . Diabetes mellitus ([place]) 08/31/2022  . Hypertension 06/11/2022  . Ischemic cardiomyopathy 06/11/2022  . Coronary artery disease 06/11/2022  . Chronic HFrEF (heart failure with reduced ejection fraction) ([place]) 06/11/2022  . Angina pectoris associated with type 2 diabetes mellitus ([place]) 01/05/2022  . 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 edema associated with type 2 diabetes mellitus ([place]) 05/01/2021  . Cardiomyopathy ([place]) 04/14/2021  . Depression, major, recurrent, mild ([place]) 09/25/2019  . History of 2019 novel coronavirus disease (COVID-19) 06/23/2019  . Migraine headache without aura 01/14/2018  . Type II diabetes mellitus ([place]) 05/22/2017  . Diabetic retinopathy of left eye, with macular edema, with severe nonproliferative retinopathy, associated with type 2 diabetes mellitus 05/21/2016  . Severe nonproliferative diabetic retinopathy of right eye without macular edema associated with type 2 diabetes mellitus ([place]) 05/21/2016  . Hyperlipidemia LDL goal <70 03/10/2016  . MI (mitral incompetence) 02/25/2016  . Insomnia 02/25/2016  . Anxiety 09/04/2014    Past Surgical History:  Procedure Laterality Date  . CHOLECYSTECTOMY  1999  . COLONOSCOPY WITH PROPOFOL N/A 03/25/2021   Procedure: COLONOSCOPY WITH PROPOFOL;  Surgeon: Jonathon Bellows, MD;  Location: [facility] ENDOSCOPY;  Service: Gastroenterology;  Laterality: N/A;  . COLONOSCOPY WITH PROPOFOL N/A 04/17/2021   Procedure: COLONOSCOPY WITH PROPOFOL;  Surgeon: Jonathon Bellows, MD;  Location: [facility] ENDOSCOPY;  Service: Gastroenterology;  Laterality: N/A;  . ESOPHAGOGASTRODUODENOSCOPY (EGD) WITH PROPOFOL N/A 03/25/2021   Procedure: ESOPHAGOGASTRODUODENOSCOPY (EGD) WITH PROPOFOL;  Surgeon: Jonathon Bellows, MD;  Location: [facility] ENDOSCOPY;  Service: Gastroenterology;  Laterality: N/A;  . ESOPHAGOGASTRODUODENOSCOPY (EGD) WITH PROPOFOL N/A 04/17/2021   Procedure: ESOPHAGOGASTRODUODENOSCOPY (EGD) WITH PROPOFOL;  Surgeon: Jonathon Bellows,  MD;  Location: [facility] ENDOSCOPY;  Service: Gastroenterology;  Laterality: N/A;  . RIGHT/LEFT HEART CATH AND CORONARY ANGIOGRAPHY Bilateral 04/14/2021   Procedure: RIGHT/LEFT HEART CATH AND CORONARY ANGIOGRAPHY;  Surgeon: Nelva Bush, MD;  Location: [facility] CV LAB;  Service: Cardiovascular;  Laterality: Bilateral;  . TOE SURGERY Left 02/07/2016   Pinky Toe    Family History  Problem Relation Age of Onset  . Diabetes Mother   . Ulcers Mother   . Heart disease Father   . AAA (abdominal aortic aneurysm) Father   . Diabetes Father   . Hypertension Father   . Stroke Father   . Alzheimer's disease Father   . Heart attack Sister   . Seizures Brother   . Diabetes Maternal Grandmother   . Breast cancer Maternal Grandmother     Social History   Tobacco Use  . Smoking status: Never  . Smokeless tobacco: Never  Substance Use Topics  . Alcohol use: No    Alcohol/week: 0.0 standard drinks of alcohol     Current Outpatient Medications:  .  albuterol (VENTOLIN HFA) 108 (90 Base) MCG/ACT inhaler, INHALE 2 PUFFS INTO THE LUNGS EVERY 6 HOURS AS NEEDED FOR WHEEZING OR SHORTNESS OF BREATH, Disp: 6.7 g, Rfl: 0 .  amLODipine (NORVASC) 5 MG tablet, Take 5 mg by mouth daily., Disp: , Rfl:  .  aspirin EC 81 MG tablet, Take 1 tablet (81 mg total) by mouth daily. Swallow whole., Disp: 90 tablet, Rfl: 3 .  atorvastatin (LIPITOR) 80 MG tablet, Take  1 tablet (80 mg total) by mouth daily., Disp: 90 tablet, Rfl: 1 .  blood glucose meter kit and supplies, Dispense based on patient and insurance preference. Use up to four times daily as directed. (FOR ICD-10 E10.9, E11.9)., Disp: 1 each, Rfl: 0 .  carvedilol (COREG) 12.5 MG tablet, Take 1 tablet (12.5 mg total) by mouth 2 (two) times daily., Disp: 180 tablet, Rfl: 3 .  Continuous Blood Gluc Sensor (DEXCOM G7 SENSOR) MISC, 1 Device by Does not apply route as directed., Disp: 9 each, Rfl: 3 .  ENTRESTO 24-26 MG, Take 1 tablet by mouth 2 (two) times  daily., Disp: , Rfl:  .  ezetimibe (ZETIA) 10 MG tablet, Take 1 tablet (10 mg total) by mouth daily., Disp: 90 tablet, Rfl: 1 .  fluticasone (FLONASE) 50 MCG/ACT nasal spray, Place 2 sprays into both nostrils daily., Disp: 16 g, Rfl: 6 .  JARDIANCE 25 MG TABS tablet, Take 1 tablet (25 mg total) by mouth daily., Disp: 90 tablet, Rfl: 2 .  levocetirizine (XYZAL) 5 MG tablet, Take 1 tablet (5 mg total) by mouth every evening., Disp: 90 tablet, Rfl: 1 .  montelukast (SINGULAIR) 10 MG tablet, Take 1 tablet (10 mg total) by mouth at bedtime., Disp: 90 tablet, Rfl: 1 .  Multiple Vitamins-Minerals (MULTIVITAMIN WITH MINERALS) tablet, Take 1 tablet by mouth daily., Disp: 100 tablet, Rfl: 0 .  nitroGLYCERIN (NITROSTAT) 0.4 MG SL tablet, Place 1 tablet (0.4 mg total) under the tongue every 5 (five) minutes as needed for chest pain., Disp: 25 tablet, Rfl: 3 .  spironolactone (ALDACTONE) 25 MG tablet, Take 1 tablet (25 mg total) by mouth daily., Disp: 90 tablet, Rfl: 3 .  Dulaglutide (TRULICITY) 3 FX/9.0WI SOPN, Inject 3 mg as directed once a week. To replace victoza (Patient not taking: Reported on 10/29/2022), Disp: 6 mL, Rfl: 2 .  ENTRESTO 24-26 MG, Take 1 tablet by mouth 2 (two) times daily. (Patient not taking: Reported on 10/29/2022), Disp: , Rfl:  .  Insulin Glargine (BASAGLAR KWIKPEN) 100 UNIT/ML, Inject 100 Units into the skin daily. (Patient not taking: Reported on 10/29/2022), Disp: 30 mL, Rfl: 6  No Known Allergies  I personally reviewed active problem list, medication list, allergies, family history, social history, health maintenance with the patient/caregiver today.   ROS  Ten systems reviewed and is negative except as mentioned in HPI   Objective  Vitals:   10/29/22 0956  BP: 124/72  Pulse: 78  Resp: 14  Temp: 98.4 F (36.9 C)  TempSrc: Oral  SpO2: 98%  Weight: 177 lb 8 oz (80.5 kg)  Height: _0  (1.753 m)    Body mass index is 26.21 kg/m.  Physical Exam  Constitutional:  Patient appears well-developed and well-nourished.  No distress.  HEENT: head atraumatic, normocephalic, pupils equal and reactive to light, ears normal TM, neck supple, throat within normal limits Cardiovascular: Normal rate, regular rhythm and normal heart sounds.  No murmur heard. No BLE edema. Pulmonary/Chest: Effort normal and breath sounds normal. No respiratory distress. Abdominal: Soft.  There is no tenderness. Psychiatric: Patient has a normal mood and affect. behavior is normal. Judgment and thought content normal.   Recent Results (from the past 2160 hour(s))  POCT HgB A1C     Status: Abnormal   Collection Time: 08/06/22  9:40 AM  Result Value Ref Range   Hemoglobin A1C 7.9 (A) 4.0 - 5.6 %   HbA1c POC (<> result, manual entry)     HbA1c, POC (prediabetic  range)     HbA1c, POC (controlled diabetic range)    POCT Glucose (Device for Home Use)     Status: Abnormal   Collection Time: 08/31/22 10:14 AM  Result Value Ref Range   Glucose Fasting, POC 127 (A) 70 - 99 mg/dL   POC Glucose    Basic metabolic panel     Status: Abnormal   Collection Time: 10/25/22  7:59 PM  Result Value Ref Range   Sodium 138 135 - 145 mmol/L   Potassium 4.5 3.5 - 5.1 mmol/L   Chloride 110 98 - 111 mmol/L   CO2 21 (L) 22 - 32 mmol/L   Glucose, Bld 116 (H) 70 - 99 mg/dL    Comment: Glucose reference range applies only to samples taken after fasting for at least 8 hours.   BUN 23 (H) 6 - 20 mg/dL   Creatinine, Ser 1.18 (H) 0.44 - 1.00 mg/dL   Calcium 9.0 8.9 - 10.3 mg/dL   GFR, Estimated 56 (L) >60 mL/min    Comment: (NOTE) Calculated using the CKD-EPI Creatinine Equation (2021)    Anion gap 7 5 - 15    Comment: Performed at [facility], [address] [place], [place] [address]  CBC     Status: Abnormal   Collection Time: 10/25/22  7:59 PM  Result Value Ref Range   WBC 9.2 4.0 - 10.5 K/uL   RBC 3.55 (L) 3.87 - 5.11 MIL/uL   Hemoglobin 10.3 (L) 12.0 - 15.0 g/dL   HCT 31.4 (L) 36.0  - 46.0 %   MCV 88.5 80.0 - 100.0 fL   MCH 29.0 26.0 - 34.0 pg   MCHC 32.8 30.0 - 36.0 g/dL   RDW 12.9 11.5 - 15.5 %   Platelets 256 150 - 400 K/uL   nRBC 0.0 0.0 - 0.2 %    Comment: Performed at [facility], [place], [place] [address]  Troponin I (High Sensitivity)     Status: None   Collection Time: 10/25/22  7:59 PM  Result Value Ref Range   Troponin I (High Sensitivity) 6 <18 ng/L    Comment: (NOTE) Elevated high sensitivity troponin I (hsTnI) values and significant  changes across serial measurements may suggest ACS but many other  chronic and acute conditions are known to elevate hsTnI results.  Refer to the "Links" section for chest pain algorithms and additional  guidance. Performed at [facility], [address] [place], [place] [address]   Troponin I (High Sensitivity)     Status: None   Collection Time: 10/25/22 10:19 PM  Result Value Ref Range   Troponin I (High Sensitivity) 7 <18 ng/L    Comment: (NOTE) Elevated high sensitivity troponin I (hsTnI) values and significant  changes across serial measurements may suggest ACS but many other  chronic and acute conditions are known to elevate hsTnI results.  Refer to the "Links" section for chest pain algorithms and additional  guidance. Performed at [facility], [address] [place], [place] [address]   POC urine preg, ED     Status: None   Collection Time: 10/25/22 10:28 PM  Result Value Ref Range   Preg Test, Ur Negative Negative  POCT HgB A1C     Status: Abnormal   Collection Time: 10/29/22  9:59 AM  Result Value Ref Range   Hemoglobin A1C 8.5 (A) 4.0 - 5.6 %   HbA1c POC (<> result, manual entry)     HbA1c, POC (  prediabetic range)     HbA1c, POC (controlled diabetic range)        PHQ2/9:    10/29/2022    9:58 AM 08/06/2022    9:39 AM 05/06/2022   10:03 AM 05/01/2022    8:47 AM 03/10/2022    9:02 AM  Depression screen PHQ 2/9  Decreased Interest 0 0 0 0  0  Down, Depressed, Hopeless 0 0 0 0 0  PHQ - 2 Score 0 0 0 0 0  Altered sleeping 0 0 0 0 0  Tired, decreased energy 0 0 0 0 0  Change in appetite 0 0 0 0 0  Feeling bad or failure about yourself  0 0 0 0 0  Trouble concentrating 0 0 0 0 0  Moving slowly or fidgety/restless 0 0 0 0 0  Suicidal thoughts 0 0 0 0 0  PHQ-9 Score 0 0 0 0 0  Difficult doing work/chores     Not difficult at all    phq 9 is negative   Fall Risk:    10/29/2022    9:47 AM 08/06/2022    9:39 AM 05/06/2022   10:03 AM 05/01/2022    8:39 AM 03/10/2022    9:02 AM  Fall Risk   Falls in the past year? 0 0 0 0 0  Number falls in past yr:  0 0  0  Injury with Fall?  0 0  0  Risk for fall due to : No Fall Risks No Fall Risks No Fall Risks No Fall Risks   Follow up Falls prevention discussed;Education provided;Falls evaluation completed Falls prevention discussed Falls prevention discussed Falls prevention discussed;Education provided;Falls evaluation completed       Functional Status Survey: Is the patient deaf or have difficulty hearing?: No Does the patient have difficulty seeing, even when wearing glasses/contacts?: No Does the patient have difficulty concentrating, remembering, or making decisions?: No Does the patient have difficulty walking or climbing stairs?: No Does the patient have difficulty dressing or bathing?: No Does the patient have difficulty doing errands alone such as visiting a doctor's office or shopping?: No    Assessment & Plan  1. 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(HCC)  - POCT HgB A1C  2. Chronic systolic heart failure ([place])  On medical management   3. Depression, major, in remission ([place])  stable  4. Dyslipidemia due to type 2 diabetes mellitus (HCC)  - atorvastatin (LIPITOR) 80 MG tablet; Take 1 tablet (80 mg total) by mouth daily.  Dispense: 90 tablet; Refill: 1 - ezetimibe (ZETIA) 10 MG tablet; Take 1 tablet (10 mg total) by mouth daily.  Dispense: 90 tablet;  Refill: 1  5. Hypertension associated with diabetes ([place])   6.Angina pectoris associated with type 2 diabetes mellitus ([place])  On medical management   7. Coronary artery disease due to lipid rich plaque  - atorvastatin (LIPITOR) 80 MG tablet; Take 1 tablet (80 mg total) by mouth daily.  Dispense: 90 tablet; Refill: 1 - ezetimibe (ZETIA) 10 MG tablet; Take 1 tablet (10 mg total) by mouth daily.  Dispense: 90 tablet; Refill: 1  8. Perennial allergic rhinitis with seasonal variation  - montelukast (SINGULAIR) 10 MG tablet; Take 1 tablet (10 mg total) by mouth at bedtime.  Dispense: 90 tablet; Refill: 1  9. Asthma, mild intermittent, well-controlled  - montelukast (SINGULAIR) 10 MG tablet; Take 1 tablet (10 mg total) by mouth at bedtime.  Dispense: 90 tablet; Refill: 1  10. Viral URI  - benzonatate (  TESSALON) 100 MG capsule; Take 1 capsule (100 mg total) by mouth 2 (two) times daily as needed for cough.  Dispense: 40 capsule; Refill: 0  11. Fever blister  - valACYclovir (VALTREX) 1000 MG tablet; Take 1 tablet (1,000 mg total) by mouth 2 (two) times daily for 10 days.  Dispense: 10 tablet; Refill: 0

## 2022-10-29 ENCOUNTER — Encounter: Payer: Self-pay | Admitting: Family Medicine

## 2022-10-29 ENCOUNTER — Ambulatory Visit (INDEPENDENT_AMBULATORY_CARE_PROVIDER_SITE_OTHER): Payer: Self-pay | Admitting: Family Medicine

## 2022-10-29 VITALS — BP 124/72 | HR 78 | Temp 98.4°F | Resp 14 | Ht 69.0 in | Wt 177.5 lb

## 2022-10-29 DIAGNOSIS — J302 Other seasonal allergic rhinitis: Secondary | ICD-10-CM

## 2022-10-29 DIAGNOSIS — E1129 Type 2 diabetes mellitus with other diabetic kidney complication: Secondary | ICD-10-CM

## 2022-10-29 DIAGNOSIS — I5022 Chronic systolic (congestive) heart failure: Secondary | ICD-10-CM

## 2022-10-29 DIAGNOSIS — I209 Angina pectoris, unspecified: Secondary | ICD-10-CM

## 2022-10-29 DIAGNOSIS — I152 Hypertension secondary to endocrine disorders: Secondary | ICD-10-CM

## 2022-10-29 DIAGNOSIS — B001 Herpesviral vesicular dermatitis: Secondary | ICD-10-CM

## 2022-10-29 DIAGNOSIS — F325 Major depressive disorder, single episode, in full remission: Secondary | ICD-10-CM

## 2022-10-29 DIAGNOSIS — E1169 Type 2 diabetes mellitus with other specified complication: Secondary | ICD-10-CM

## 2022-10-29 DIAGNOSIS — E785 Hyperlipidemia, unspecified: Secondary | ICD-10-CM

## 2022-10-29 DIAGNOSIS — J4520 Mild intermittent asthma, uncomplicated: Secondary | ICD-10-CM

## 2022-10-29 DIAGNOSIS — J3089 Other allergic rhinitis: Secondary | ICD-10-CM

## 2022-10-29 DIAGNOSIS — J069 Acute upper respiratory infection, unspecified: Secondary | ICD-10-CM

## 2022-10-29 DIAGNOSIS — R809 Proteinuria, unspecified: Secondary | ICD-10-CM

## 2022-10-29 DIAGNOSIS — E1159 Type 2 diabetes mellitus with other circulatory complications: Secondary | ICD-10-CM

## 2022-10-29 DIAGNOSIS — I2583 Coronary atherosclerosis due to lipid rich plaque: Secondary | ICD-10-CM

## 2022-10-29 DIAGNOSIS — I2510 Atherosclerotic heart disease of native coronary artery without angina pectoris: Secondary | ICD-10-CM

## 2022-10-29 LAB — POCT GLYCOSYLATED HEMOGLOBIN (HGB A1C): Hemoglobin A1C: 8.5 % — AB (ref 4.0–5.6)

## 2022-10-29 MED ORDER — EZETIMIBE 10 MG PO TABS: 10.0000 mg | ORAL_TABLET | Freq: Every day | ORAL | 1 refills | Status: DC

## 2022-10-29 MED ORDER — BENZONATATE 100 MG PO CAPS: 100.0000 mg | ORAL_CAPSULE | Freq: Two times a day (BID) | ORAL | 0 refills | Status: DC | PRN

## 2022-10-29 MED ORDER — ATORVASTATIN CALCIUM 80 MG PO TABS: 80.0000 mg | ORAL_TABLET | Freq: Every day | ORAL | 1 refills | Status: DC

## 2022-10-29 MED ORDER — VALACYCLOVIR HCL 1 G PO TABS: 1000.0000 mg | ORAL_TABLET | Freq: Two times a day (BID) | ORAL | 0 refills | Status: AC

## 2022-10-29 MED ORDER — MONTELUKAST SODIUM 10 MG PO TABS: 10.0000 mg | ORAL_TABLET | Freq: Every day | ORAL | 1 refills | Status: DC

## 2022-11-02 ENCOUNTER — Ambulatory Visit: Payer: 59 | Admitting: Dietician

## 2022-11-09 ENCOUNTER — Other Ambulatory Visit: Payer: Self-pay | Admitting: Family Medicine

## 2022-11-09 DIAGNOSIS — E1159 Type 2 diabetes mellitus with other circulatory complications: Secondary | ICD-10-CM

## 2022-11-09 DIAGNOSIS — J302 Other seasonal allergic rhinitis: Secondary | ICD-10-CM

## 2022-11-30 ENCOUNTER — Encounter: Payer: Self-pay | Admitting: Nurse Practitioner

## 2022-11-30 ENCOUNTER — Ambulatory Visit: Payer: Self-pay

## 2022-11-30 ENCOUNTER — Encounter: Payer: Self-pay | Admitting: Emergency Medicine

## 2022-11-30 ENCOUNTER — Ambulatory Visit (INDEPENDENT_AMBULATORY_CARE_PROVIDER_SITE_OTHER): Payer: 59 | Admitting: Nurse Practitioner

## 2022-11-30 VITALS — BP 120/66 | HR 87 | Temp 98.3°F | Ht 69.0 in | Wt 175.8 lb

## 2022-11-30 DIAGNOSIS — R0989 Other specified symptoms and signs involving the circulatory and respiratory systems: Secondary | ICD-10-CM

## 2022-11-30 DIAGNOSIS — R051 Acute cough: Secondary | ICD-10-CM

## 2022-11-30 DIAGNOSIS — R52 Pain, unspecified: Secondary | ICD-10-CM | POA: Diagnosis not present

## 2022-11-30 DIAGNOSIS — J069 Acute upper respiratory infection, unspecified: Secondary | ICD-10-CM

## 2022-11-30 LAB — POCT INFLUENZA A/B
Influenza A, POC: NEGATIVE
Influenza B, POC: NEGATIVE

## 2022-11-30 MED ORDER — METHYLPREDNISOLONE 4 MG PO TBPK: ORAL_TABLET | ORAL | 0 refills | Status: DC

## 2022-11-30 MED ORDER — ALBUTEROL SULFATE HFA 108 (90 BASE) MCG/ACT IN AERS: 2.0000 | INHALATION_SPRAY | Freq: Four times a day (QID) | RESPIRATORY_TRACT | 0 refills | Status: DC | PRN

## 2022-11-30 NOTE — Progress Notes (Signed)
BP 120/66   Pulse 87   Temp 98.3 F (36.8 C) (Oral)   Ht 5\' 9"  (1.753 m)   Wt 175 lb 12.8 oz (79.7 kg)   SpO2 99%   BMI 25.96 kg/m    Subjective:    Patient ID: , female    DOB: 05/29/1971, 51 y.o.   MRN: [id number]  HPI: Sarah Duffy is a 51 y.o. female  Chief Complaint  Patient presents with   Acute Visit    Body aches, sneezing, runny nose, cough, and SOB since 11/29/22   12/01/22: She says symptoms started yesterday.  She says her symptoms include sneezing, nose running, cough, she says that she did feel slightly short of breath and fatigue.  She denies any fever.  She says that she has albuterol inhaler but has not used it.  She says that she took theraflu.  She says she has not been taking her singulair or xyzal daily. Recommend that she does. Covid test pending, flu negative. Discussed antiviral treatment for covid if positive and she would like to be treated if positive.  Will send in medrol dose pack and send in albuterol inhaler. Work note provided.   Relevant past medical, surgical, family and social history reviewed and updated as indicated. Interim medical history since our last visit reviewed. Allergies and medications reviewed and updated.  Review of Systems  Constitutional: Negative for fever or weight change. Positive for body aches Respiratory: positive for cough and slight shortness of breath.   Cardiovascular: Negative for chest pain or palpitations.  Gastrointestinal: Negative for abdominal pain, no bowel changes.  Musculoskeletal: Negative for gait problem or joint swelling.  Skin: Negative for rash.  Neurological: Negative for dizziness or headache.  No other specific complaints in a complete review of systems (except as listed in HPI above).      Objective:    BP 120/66   Pulse 87   Temp 98.3 F (36.8 C) (Oral)   Ht 5\' 9"  (1.753 m)   Wt 175 lb 12.8 oz (79.7 kg)   SpO2 99%   BMI 25.96 kg/m   Wt Readings from Last 3 Encounters:   11/30/22 175 lb 12.8 oz (79.7 kg)  10/29/22 177 lb 8 oz (80.5 kg)  10/25/22 177 lb (80.3 kg)    Physical Exam  Constitutional: Patient appears well-developed and well-nourished.  No distress.  HEENT: head atraumatic, normocephalic, pupils equal and reactive to light, ears TMs clear, neck supple, throat within normal limits Cardiovascular: Normal rate, regular rhythm and normal heart sounds.  No murmur heard. No BLE edema. Pulmonary/Chest: Effort normal and breath sounds minor expiratory wheeze. No respiratory distress. Abdominal: Soft.  There is no tenderness. Psychiatric: Patient has a normal mood and affect. behavior is normal. Judgment and thought content normal.     Assessment & Plan:   Problem List Items Addressed This Visit   None Visit Diagnoses     Body aches    -  Primary   push fluids, take xyzal and singulair daily.  start using albuterol inhaler, may use tessalon perls.  awaiting covid results, will send in treatment if positive   Relevant Orders   POCT Influenza A/B (Completed)   Novel Coronavirus, NAA ([facility])   Runny nose       push fluids, take xyzal and singulair daily. start using albuterol inhaler, may use tessalon perls. awaiting covid results, will send in treatment if positive   Relevant Orders   POCT Influenza A/B (Completed)  Novel Coronavirus, NAA ([facility])   Acute cough       push fluids, take xyzal and singulair daily. start using albuterol inhaler, may use tessalon perls. awaiting covid results, will send in treatment if positive   Relevant Orders   POCT Influenza A/B (Completed)   Novel Coronavirus, NAA ([facility])   Upper respiratory tract infection, unspecified type       push fluids, take xyzal and singulair daily. start using albuterol inhaler, may use tessalon perls. awaiting covid results, will send in treatment if positive   Relevant Medications   albuterol (VENTOLIN HFA) 108 (90 Base) MCG/ACT inhaler   methylPREDNISolone (MEDROL DOSEPAK) 4  MG TBPK tablet        Follow up plan: Return if symptoms worsen or fail to improve.

## 2022-11-30 NOTE — Telephone Encounter (Signed)
Reason for Disposition  [1] MILD difficulty breathing (e.g., minimal/no SOB at rest, SOB with walking, pulse <100) AND [2] NEW-onset or WORSE than normal  Answer Assessment - Initial Assessment Questions 1. RESPIRATORY STATUS: "Describe your breathing?" (e.g., wheezing, shortness of breath, unable to speak, severe coughing)      Difficult to take a deep breath 2. ONSET: "When did this breathing problem begin?"      Last night  7:30-8:00 pm 3. PATTERN "Does the difficult breathing come and go, or has it been constant since it started?"      Comes and goes 4. SEVERITY: "How bad is your breathing?" (e.g., mild, moderate, severe)    - MILD: No SOB at rest, mild SOB with walking, speaks normally in sentences, can lie down, no retractions, pulse < 100.    - MODERATE: SOB at rest, SOB with minimal exertion and prefers to sit, cannot lie down flat, speaks in phrases, mild retractions, audible wheezing, pulse 100-120.    - SEVERE: Very SOB at rest, speaks in single words, struggling to breathe, sitting hunched forward, retractions, pulse > 120      Mild - difficult to take deep breaht 5. RECURRENT SYMPTOM: "Have you had difficulty breathing before?" If Yes, ask: "When was the last time?" and "What happened that time?"       6. CARDIAC HISTORY: "Do you have any history of heart disease?" (e.g., heart attack, angina, bypass surgery, angioplasty)       7. LUNG HISTORY: "Do you have any history of lung disease?"  (e.g., pulmonary embolus, asthma, emphysema)      8. CAUSE: "What do you think is causing the breathing problem?"       9. OTHER SYMPTOMS: "Do you have any other symptoms? (e.g., dizziness, runny nose, cough, chest pain, fever)     Cough, runny nose  , scratchy throat 10. O2 SATURATION MONITOR:  "Do you use an oxygen saturation monitor (pulse oximeter) at home?" If Yes, ask: "What is your reading (oxygen level) today?" "What is your usual oxygen saturation reading?" (e.g., 95%)        11.  PREGNANCY: "Is there any chance you are pregnant?" "When was your last menstrual period?"        12. TRAVEL: "Have you traveled out of the country in the last month?" (e.g., travel history, exposures)  Protocols used: Breathing Difficulty-A-AH

## 2022-11-30 NOTE — Telephone Encounter (Signed)
  Chief Complaint: SOB, cold s/s Symptoms: SOB, Scratchy throat, Runny nose , cough Frequency: last night for SOB Pertinent Negatives: Patient denies chest pain Disposition: [] ED /[] Urgent Care (no appt availability in office) / [x] Appointment(In office/virtual)/ []  [facility]/ [] Home Care/ [] Refused Recommended Disposition /[] [facility]/ []  Follow-up with PCP Additional Notes: Pt has had cold s/s. Last night she experienced SOB 2 times.

## 2022-12-02 ENCOUNTER — Encounter: Payer: Self-pay | Admitting: Nurse Practitioner

## 2022-12-02 LAB — NOVEL CORONAVIRUS, NAA: SARS-CoV-2, NAA: NOT DETECTED

## 2022-12-28 ENCOUNTER — Other Ambulatory Visit: Payer: Self-pay | Admitting: Nurse Practitioner

## 2023-01-12 ENCOUNTER — Encounter: Payer: Self-pay | Admitting: Cardiology

## 2023-01-12 ENCOUNTER — Ambulatory Visit: Payer: 59 | Attending: Cardiology | Admitting: Cardiology

## 2023-01-12 VITALS — BP 110/68 | HR 69 | Ht 69.0 in | Wt 178.0 lb

## 2023-01-12 DIAGNOSIS — I10 Essential (primary) hypertension: Secondary | ICD-10-CM | POA: Diagnosis not present

## 2023-01-12 DIAGNOSIS — I2510 Atherosclerotic heart disease of native coronary artery without angina pectoris: Secondary | ICD-10-CM | POA: Diagnosis not present

## 2023-01-12 DIAGNOSIS — I255 Ischemic cardiomyopathy: Secondary | ICD-10-CM

## 2023-01-12 DIAGNOSIS — H2513 Age-related nuclear cataract, bilateral: Secondary | ICD-10-CM | POA: Diagnosis not present

## 2023-01-12 DIAGNOSIS — H4311 Vitreous hemorrhage, right eye: Secondary | ICD-10-CM | POA: Diagnosis not present

## 2023-01-12 DIAGNOSIS — E113513 Type 2 diabetes mellitus with proliferative diabetic retinopathy with macular edema, bilateral: Secondary | ICD-10-CM | POA: Diagnosis not present

## 2023-01-12 LAB — HM DIABETES EYE EXAM

## 2023-01-12 NOTE — Patient Instructions (Signed)
Medication Instructions:   STOP Amlodipine  *If you need a refill on your cardiac medications before your next appointment, please call your pharmacy*   Lab Work:  Your physician recommends that you return for lab work in: 6 weeks prior to your follow up appt  at the medical mall. You will need to be fasting. No appt is needed. Hours are M-F 7AM- 6 PM.   If you have labs (blood work) drawn today and your tests are completely normal, you will receive your results only by: [place] (if you have MyChart) OR A paper copy in the mail If you have any lab test that is abnormal or we need to change your treatment, we will call you to review the results.   Testing/Procedures:  None Ordered  Follow-Up: At [facility], you and your health needs are our priority.  As part of our continuing mission to provide you with exceptional heart care, we have created designated Provider Care Teams.  These Care Teams include your primary Cardiologist (physician) and Advanced Practice Providers (APPs -  Physician Assistants and Nurse Practitioners) who all work together to provide you with the care you need, when you need it.  We recommend signing up for the patient portal called "MyChart".  Sign up information is provided on this After Visit Summary.  MyChart is used to connect with patients for Virtual Visits (Telemedicine).  Patients are able to view lab/test results, encounter notes, upcoming appointments, etc.  Non-urgent messages can be sent to your provider as well.   To learn more about what you can do with MyChart, go to [email or web address].    Your next appointment:   6 week(s)  Provider:   You may see Kate Sable, MD or one of the following Advanced Practice Providers on your designated Care Team:   Murray Hodgkins, NP Christell Faith, PA-C Cadence Kathlen Mody, PA-C Gerrie Nordmann, NP

## 2023-01-12 NOTE — Progress Notes (Signed)
Cardiology Office Note:    Date:  01/12/2023   ID:  Sarah Duffy, DOB 1971/07/25, MRN [id number]  PCP:  Steele Sizer, MD   [place]  Cardiologist:  Kate Sable, MD  Advanced Practice Provider:  No care team member to display Electrophysiologist:  None       Referring MD: Steele Sizer, MD   Chief Complaint  Patient presents with   Follow-up    3 month f/u, no new cardiac concerns     History of Present Illness:    Sarah Duffy is a 52 y.o. female with a hx of ischemic cardiomyopathy, initial EF 35 to 40%, (last EF 50 to 55% ), multivessel CAD (80% mLAD, 99% OM 2, 34 to 80% pRCA.) hypertension, hyperlipidemia who presents for follow-up.   Did not perform CT surgery due to insurance issues, heart deductible.  Has a new insurance, plans to contact CT surgery office about scheduling CABG. her insurance now covers [place], this was restarted.  Losartan stopped.  BP control, has no concerns at this time.  Prior notes Echo 03/12/2021 EF 35 to 40%, moderate MR Left heart cath 04/14/2021 multivessel CAD, 80% mLAD, 99% OM 2, 70 to 80% pRCA.  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 02/2021 Echo: EF 35-40%, glob HK, GrI DD, nl RV fxn, mildly dil LA, mod MR; b. 06/2022 Echo: EF 50-55%, no rwma, GrII DD, nl RV fxn, RVSP 73mmHg, mildly dil LA, mild MR, AoV sclerosis.   Coronary artery disease    a. 04/2021 Cath: LM nl, LAD 70p/m, 65m, 80d, LCX 43m, OM1 40, OM2 99 (fills via collats from OM1), RCA 60d, RPDA 75.   COVID-19 2021   Hyperlipidemia LDL goal <70    Hypertension    Ischemic cardiomyopathy    a. 02/2021 Echo: EF 35-40%, glob HK, GrI DD; b. 06/2022 Echo: EF 50-55%.   Moderate mitral regurgitation    a. 02/2021 Echo: Mod MR.   MRSA infection within last 3 months 02/25/2016   Osteomyelitis of foot ([place]) 08/26/2016   Type II diabetes mellitus ([place])     Past Surgical  History:  Procedure Laterality Date   CHOLECYSTECTOMY  1999   COLONOSCOPY WITH PROPOFOL N/A 03/25/2021   Procedure: COLONOSCOPY WITH PROPOFOL;  Surgeon: Jonathon Bellows, MD;  Location: [facility] ENDOSCOPY;  Service: Gastroenterology;  Laterality: N/A;   COLONOSCOPY WITH PROPOFOL N/A 04/17/2021   Procedure: COLONOSCOPY WITH PROPOFOL;  Surgeon: Jonathon Bellows, MD;  Location: [facility] ENDOSCOPY;  Service: Gastroenterology;  Laterality: N/A;   ESOPHAGOGASTRODUODENOSCOPY (EGD) WITH PROPOFOL N/A 03/25/2021   Procedure: ESOPHAGOGASTRODUODENOSCOPY (EGD) WITH PROPOFOL;  Surgeon: Jonathon Bellows, MD;  Location: [facility] ENDOSCOPY;  Service: Gastroenterology;  Laterality: N/A;   ESOPHAGOGASTRODUODENOSCOPY (EGD) WITH PROPOFOL N/A 04/17/2021   Procedure: ESOPHAGOGASTRODUODENOSCOPY (EGD) WITH PROPOFOL;  Surgeon: Jonathon Bellows, MD;  Location: [facility] ENDOSCOPY;  Service: Gastroenterology;  Laterality: N/A;   RIGHT/LEFT HEART CATH AND CORONARY ANGIOGRAPHY Bilateral 04/14/2021   Procedure: RIGHT/LEFT HEART CATH AND CORONARY ANGIOGRAPHY;  Surgeon: Nelva Bush, MD;  Location: [place] [facility] LAB;  Service: Cardiovascular;  Laterality: Bilateral;   TOE SURGERY Left 02/07/2016   [place]    Current Medications: Current Meds  Medication Sig   albuterol (VENTOLIN HFA) 108 (90 Base) MCG/ACT inhaler Inhale 2 puffs into the lungs every 6 (six) hours as needed for wheezing or shortness of breath.   aspirin EC 81 MG tablet Take 1 tablet (81  mg total) by mouth daily. Swallow whole.   atorvastatin (LIPITOR) 80 MG tablet Take 1 tablet (80 mg total) by mouth daily.   blood glucose meter kit and supplies Dispense based on patient and insurance preference. Use up to four times daily as directed. (FOR ICD-10 E10.9, E11.9).   carvedilol (COREG) 12.5 MG tablet Take 1 tablet (12.5 mg total) by mouth 2 (two) times daily.   Continuous Blood Gluc Sensor (DEXCOM G7 SENSOR) MISC 1 Device by Does not apply route as directed.   Dulaglutide (TRULICITY) 3 YN/8.2NF  SOPN Inject 3 mg as directed once a week. To replace victoza   ENTRESTO 24-26 MG Take 1 tablet by mouth 2 (two) times daily.   Insulin Glargine (BASAGLAR KWIKPEN) 100 UNIT/ML Inject 100 Units into the skin daily.   JARDIANCE 25 MG TABS tablet Take 1 tablet (25 mg total) by mouth daily.   montelukast (SINGULAIR) 10 MG tablet Take 1 tablet (10 mg total) by mouth at bedtime.   Multiple Vitamins-Minerals (MULTIVITAMIN WITH MINERALS) tablet Take 1 tablet by mouth daily.   nitroGLYCERIN (NITROSTAT) 0.4 MG SL tablet PLACE 1 TABLET UNDER THE TONGUE EVERY 5 MINUTES AS NEEDED FOR CHEST PAIN.   spironolactone (ALDACTONE) 25 MG tablet Take 1 tablet (25 mg total) by mouth daily.   [DISCONTINUED] amLODipine (NORVASC) 5 MG tablet TAKE 1 TABLET (5 MG TOTAL) BY MOUTH DAILY.     Allergies:   Patient has no known allergies.   Social History   Socioeconomic History   Marital status: Single    Spouse name: Not on file   Number of children: 1   Years of education: Not on file   Highest education level: Some college, no degree  Occupational History   Occupation: Public affairs consultant   Tobacco Use   Smoking status: Never   Smokeless tobacco: Never  Vaping Use   Vaping Use: Never used  Substance and Sexual Activity   Alcohol use: No    Alcohol/week: 0.0 standard drinks of alcohol   Drug use: No   Sexual activity: Yes    Partners: Male    Birth control/protection: Injection  Other Topics Concern   Not on file  Social History Narrative   She used to work at [employer], but  Feb 2017 she left work because of  MRSA infection.osteomyelitis and uncontrolled DM. She has been back to work since Feb 2018   Lives alone   Social Determinants of Health   Financial Resource Strain: Medium Risk (05/01/2022)   Overall Financial Resource Strain (CARDIA)    Difficulty of Paying Living Expenses: Somewhat hard  Food Insecurity: Food Insecurity Present (05/01/2022)   Hunger Vital Sign    Worried About [place] in the Last Year: Often true    Ran Out of Food in the Last Year: Often true  Transportation Needs: Unmet Transportation Needs (05/01/2022)   PRAPARE - Hydrologist (Medical): Yes    Lack of Transportation (Non-Medical): Yes  Physical Activity: Insufficiently Active (05/01/2022)   Exercise Vital Sign    Days of Exercise per Week: 3 days    Minutes of Exercise per Session: 30 min  Stress: No Stress Concern Present (05/01/2022)   [place]    Feeling of Stress : Only a little  Social Connections: Socially Isolated (05/01/2022)   Social Connection and Isolation Panel [NHANES]    Frequency of Communication with Friends and Family: Once  a week    Frequency of Social Gatherings with Friends and Family: Once a week    Attends Religious Services: More than 4 times per year    Active Member of [employer] or Organizations: No    Attends Engineer, structural: Never    Marital Status: Never married     Family History: The patient's family history includes AAA (abdominal aortic aneurysm) in her father; Alzheimer's disease in her father; Breast cancer in her maternal grandmother; Diabetes in her father, maternal grandmother, and mother; Heart attack in her sister; Heart disease in her father; Hypertension in her father; Seizures in her brother; Stroke in her father; Ulcers in her mother.  ROS:   Please see the history of present illness.     All other systems reviewed and are negative.  EKGs/Labs/Other Studies Reviewed:    The following studies were reviewed today:   EKG:  EKG is ordered today.  EKG shows normal sinus rhythm.  Recent Labs: 05/01/2022: ALT 19 10/25/2022: BUN 23; Creatinine, Ser 1.18; Hemoglobin 10.3; Platelets 256; Potassium 4.5; Sodium 138  Recent Lipid Panel    Component Value Date/Time   CHOL 152 05/01/2022 0948   CHOL 208 (H) 02/28/2016 0951   TRIG 99 05/01/2022  0948   HDL 38 (L) 05/01/2022 0948   HDL 42 02/28/2016 0951   CHOLHDL 4.0 05/01/2022 0948   LDLCALC 95 05/01/2022 0948     Risk Assessment/Calculations:      Physical Exam:    VS:  BP 110/68 (BP Location: Left Arm, Patient Position: Sitting, Cuff Size: Normal)   Pulse 69   Ht 5\' 9"  (1.753 m)   Wt 178 lb (80.7 kg)   SpO2 99%   BMI 26.29 kg/m     Wt Readings from Last 3 Encounters:  01/12/23 178 lb (80.7 kg)  11/30/22 175 lb 12.8 oz (79.7 kg)  10/29/22 177 lb 8 oz (80.5 kg)     GEN:  Well nourished, well developed in no acute distress HEENT: Normal NECK: No JVD; No carotid bruits CARDIAC: RRR, no murmurs, rubs, gallops RESPIRATORY:  Clear to auscultation without rales, wheezing or rhonchi  ABDOMEN: Soft, non-tender, non-distended MUSCULOSKELETAL:  no edema; No deformity  SKIN: Warm and dry NEUROLOGIC:  Alert and oriented x 3 PSYCHIATRIC:  Normal affect   ASSESSMENT:    1. Coronary artery disease involving native coronary artery of native heart, unspecified whether angina present   2. Ischemic cardiomyopathy   3. Primary hypertension    PLAN:    In order of problems listed above:  Significant multivessel CAD, denies chest pain.  Continue aspirin 81 mg, Lipitor 80 mg daily.  Has new insurance, plans to call CT surgery office regarding CABG scheduling.  Obtain fasting lipid profile. Ischemic cardiomyopathy, initial echo 3/22 EF 35 to 40%.  Last echo 7/23 EF 50 to 55%.  Describes NYHA class II symptoms. continue Entresto 24-26 mg twice daily, Coreg 12.5 mg twice daily,Aldactone 25, Jardiance.  Stop Norvasc.  If BP tolerates, titrate Entresto at follow-up visit. Hypertension, BP controlled.  Entresto, Coreg, Aldactone as above.  Follow-up in 6 weeks for Entresto titration.   Medication Adjustments/Labs and Tests Ordered: Current medicines are reviewed at length with the patient today.  Concerns regarding medicines are outlined above.  Orders Placed This Encounter   Procedures   Lipid panel   EKG 12-Lead     No orders of the defined types were placed in this encounter.     Patient Instructions  Medication Instructions:   STOP Amlodipine  *If you need a refill on your cardiac medications before your next appointment, please call your pharmacy*   Lab Work:  Your physician recommends that you return for lab work in: 6 weeks prior to your follow up appt  [facility] medical mall. You will need to be fasting. No appt is needed. Hours are M-F 7AM- 6 PM.   If you have labs (blood work) drawn today and your tests are completely normal, you will receive your results only by: [place] (if you have MyChart) OR A paper copy in the mail If you have any lab test that is abnormal or we need to change your treatment, we will call you to review the results.   Testing/Procedures:  None Ordered  Follow-Up: At [facility], you and your health needs are our priority.  As part of our continuing mission to provide you with exceptional heart care, we have created designated Provider Care Teams.  These Care Teams include your primary Cardiologist (physician) and Advanced Practice Providers (APPs -  Physician Assistants and Nurse Practitioners) who all work together to provide you with the care you need, when you need it.  We recommend signing up for the patient portal called "MyChart".  Sign up information is provided on this After Visit Summary.  MyChart is used to connect with patients for Virtual Visits (Telemedicine).  Patients are able to view lab/test results, encounter notes, upcoming appointments, etc.  Non-urgent messages can be sent to your provider as well.   To learn more about what you can do with MyChart, go to [email or web address].    Your next appointment:   6 week(s)  Provider:   You may see Kate Sable, MD or one of the following Advanced Practice Providers on your designated Care Team:   Murray Hodgkins, NP Christell Faith, PA-C Cadence Kathlen Mody, PA-C Gerrie Nordmann, NP   Signed, Kate Sable, MD  01/12/2023 10:15 AM    [place]

## 2023-01-19 DIAGNOSIS — H4311 Vitreous hemorrhage, right eye: Secondary | ICD-10-CM | POA: Diagnosis not present

## 2023-01-19 DIAGNOSIS — E113512 Type 2 diabetes mellitus with proliferative diabetic retinopathy with macular edema, left eye: Secondary | ICD-10-CM | POA: Diagnosis not present

## 2023-01-19 DIAGNOSIS — H2513 Age-related nuclear cataract, bilateral: Secondary | ICD-10-CM | POA: Diagnosis not present

## 2023-01-19 DIAGNOSIS — E113511 Type 2 diabetes mellitus with proliferative diabetic retinopathy with macular edema, right eye: Secondary | ICD-10-CM | POA: Diagnosis not present

## 2023-01-25 ENCOUNTER — Ambulatory Visit: Payer: Medicaid Other | Admitting: Family Medicine

## 2023-01-25 NOTE — Progress Notes (Deleted)
Name: Sarah Duffy   MRN: [id number]    DOB: 1971-04-05   Date:01/25/2023       Progress Note  Subjective  Chief Complaint  Follow Up  HPI  DMII:recent seen by Endocrinologist and the plan is to continue basal insulin but to increase to 100 units titrating up slowly, continue Jardiance and switching from Victoza to Trulicity 3 mg, she is waiting for insurance approval and currently still on Victoza . She denies polyphagia, polyuria or polydipsia. She also takes statin therapy and ARB. She has associated dyslipidemia, polyneuropathy, CAD  MDD:  She lost her mother unexpectedly on Feb 4 th , 20.  she does not want to resume medications at this time . She is in remission, phq 9 is negative Unchanged   HTN: bp is at goal today, she had some chest pain this past weekend but pain resolved by the time she went to [facility], no sob.   CAD/Chronic systolic heart failure/Cardiomyopathy: under the care of Dr. Mylo Red, she went for second opinion at [facility] and also recommended CABG , she will has a follow up with cardiovascular surgeon Dr. Cyndia Bent in [place] to discuss , she was ready to have CABG on 09/19/2021 but it was cancelled by the physician due to personal problems. She denies any recent chest pain, taking medication daily . She has ischemic cardiomyopathy but currently no symptoms. Denies orthopnea, PND or lower extremity edema she has some SOB with activity. She has a recent visit with cardiologist and is still on medical management , waiting to see if new insurance will cover her CABG. She was not feeling well this past weekend and went to [facility] with chest pain , she had negative troponin, mild anemia that is stable, mild CKI also stable. She was given GI cocktail and sent home,  she is still feeling tired.   Conclusions Cath :   Multivessel coronary artery disease including mild to moderate diffuse plaquing of the LAD and RCA.  The mid/distal LAD demonstrates 3 focal lesions of up to 80% that are  hemodynamically significant by iFR (iFR = 0.71).  There is also a 99% stenosis of OM2 with faint left to left collaterals supplying the distal branch as well as sequential 60% distal RCA and 70-80% proximal RPDA lesions.   IMPRESSIONS  Echo done 06/23/2022 showed improvement   Left ventricular ejection fraction, by estimation, is 50 to 55%. Left ventricular ejection fraction by 3D volume is 52 %. Left ventricular ejection fraction by PLAX is 53 %. The left ventricle has low normal function. The left ventricle has no regional wall motion abnormalities. Left ventricular diastolic parameters are consistent with Grade II diastolic dysfunction (pseudonormalization). The average left ventricular global longitudinal strain is -18.9 %. 1. Right ventricular systolic function is normal. The right ventricular size is normal. There is normal pulmonary artery systolic pressure. The estimated right ventricular systolic pressure is [id number] mmHg. 2. 3. Left atrial size was mildly dilated. The mitral valve is normal in structure. Mild mitral valve regurgitation. No evidence of mitral stenosis. 4. The aortic valve is normal in structure. Aortic valve regurgitation is not visualized. Aortic valve sclerosis is present, with no evidence of aortic valve stenosis. 5. The inferior vena cava is normal in size with greater than 50% respiratory variability, suggesting right atrial pressure of 3 mmHg. 6. Comparison(s): Previous GLS reported as -13.5 Previous LVEF reported as 35-40%.  Fever blister: breaking out on the bottom of her lip, painful, we will give her valtrex today  Viral illness: fatigue, hoarseness, post-nasal drainage, nasal congestion   Patient Active Problem List   Diagnosis Date Noted   Type 2 diabetes mellitus with diabetic polyneuropathy, with long-term current use of insulin ([place]) 08/31/2022   Type 2 diabetes mellitus with retinopathy, with long-term current use of insulin ([place])  08/31/2022   Diabetes mellitus ([place]) 08/31/2022   Hypertension 06/11/2022   Ischemic cardiomyopathy 06/11/2022   Coronary artery disease 06/11/2022   Chronic HFrEF (heart failure with reduced ejection fraction) ([place]) 06/11/2022   Angina pectoris associated with type 2 diabetes mellitus ([place]) 01/05/2022   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 edema associated with type 2 diabetes mellitus ([place]) 05/01/2021   Cardiomyopathy ([place]) 04/14/2021   Depression, major, recurrent, mild ([place]) 09/25/2019   History of 2019 novel coronavirus disease (COVID-19) 06/23/2019   Migraine headache without aura 01/14/2018   Type II diabetes mellitus ([place]) 05/22/2017   Diabetic retinopathy of left eye, with macular edema, with severe nonproliferative retinopathy, associated with type 2 diabetes mellitus 05/21/2016   Severe nonproliferative diabetic retinopathy of right eye without macular edema associated with type 2 diabetes mellitus ([place]) 05/21/2016   Hyperlipidemia LDL goal <70 03/10/2016   MI (mitral incompetence) 02/25/2016   Insomnia 02/25/2016   Anxiety 09/04/2014    Past Surgical History:  Procedure Laterality Date   CHOLECYSTECTOMY  1999   COLONOSCOPY WITH PROPOFOL N/A 03/25/2021   Procedure: COLONOSCOPY WITH PROPOFOL;  Surgeon: Jonathon Bellows, MD;  Location: [facility] ENDOSCOPY;  Service: Gastroenterology;  Laterality: N/A;   COLONOSCOPY WITH PROPOFOL N/A 04/17/2021   Procedure: COLONOSCOPY WITH PROPOFOL;  Surgeon: Jonathon Bellows, MD;  Location: [facility] ENDOSCOPY;  Service: Gastroenterology;  Laterality: N/A;   ESOPHAGOGASTRODUODENOSCOPY (EGD) WITH PROPOFOL N/A 03/25/2021   Procedure: ESOPHAGOGASTRODUODENOSCOPY (EGD) WITH PROPOFOL;  Surgeon: Jonathon Bellows, MD;  Location: [facility] ENDOSCOPY;  Service: Gastroenterology;  Laterality: N/A;   ESOPHAGOGASTRODUODENOSCOPY (EGD) WITH PROPOFOL N/A 04/17/2021   Procedure: ESOPHAGOGASTRODUODENOSCOPY (EGD) WITH PROPOFOL;  Surgeon: Jonathon Bellows, MD;  Location: [facility]  ENDOSCOPY;  Service: Gastroenterology;  Laterality: N/A;   RIGHT/LEFT HEART CATH AND CORONARY ANGIOGRAPHY Bilateral 04/14/2021   Procedure: RIGHT/LEFT HEART CATH AND CORONARY ANGIOGRAPHY;  Surgeon: Nelva Bush, MD;  Location: [place] CV LAB;  Service: Cardiovascular;  Laterality: Bilateral;   TOE SURGERY Left 02/07/2016   Pinky Toe    Family History  Problem Relation Age of Onset   Diabetes Mother    Ulcers Mother    Heart disease Father    AAA (abdominal aortic aneurysm) Father    Diabetes Father    Hypertension Father    Stroke Father    Alzheimer's disease Father    Heart attack Sister    Seizures Brother    Diabetes Maternal Grandmother    Breast cancer Maternal Grandmother     Social History   Tobacco Use   Smoking status: Never   Smokeless tobacco: Never  Substance Use Topics   Alcohol use: No    Alcohol/week: 0.0 standard drinks of alcohol     Current Outpatient Medications:    albuterol (VENTOLIN HFA) 108 (90 Base) MCG/ACT inhaler, Inhale 2 puffs into the lungs every 6 (six) hours as needed for wheezing or shortness of breath., Disp: 6.7 g, Rfl: 0   aspirin EC 81 MG tablet, Take 1 tablet (81 mg total) by mouth daily. Swallow whole., Disp: 90 tablet, Rfl: 3   atorvastatin (LIPITOR) 80 MG tablet, Take 1 tablet (80 mg total) by mouth daily., Disp: 90 tablet, Rfl: 1   benzonatate (  TESSALON) 100 MG capsule, Take 1 capsule (100 mg total) by mouth 2 (two) times daily as needed for cough. (Patient not taking: Reported on 01/12/2023), Disp: 40 capsule, Rfl: 0   blood glucose meter kit and supplies, Dispense based on patient and insurance preference. Use up to four times daily as directed. (FOR ICD-10 E10.9, E11.9)., Disp: 1 each, Rfl: 0   carvedilol (COREG) 12.5 MG tablet, Take 1 tablet (12.5 mg total) by mouth 2 (two) times daily., Disp: 180 tablet, Rfl: 3   Continuous Blood Gluc Sensor (DEXCOM G7 SENSOR) MISC, 1 Device by Does not apply route as directed., Disp: 9  each, Rfl: 3   Dulaglutide (TRULICITY) 3 0000000 SOPN, Inject 3 mg as directed once a week. To replace victoza, Disp: 6 mL, Rfl: 2   ENTRESTO 24-26 MG, Take 1 tablet by mouth 2 (two) times daily., Disp: , Rfl:    ezetimibe (ZETIA) 10 MG tablet, Take 1 tablet (10 mg total) by mouth daily. (Patient not taking: Reported on 01/12/2023), Disp: 90 tablet, Rfl: 1   fluticasone (FLONASE) 50 MCG/ACT nasal spray, Place 2 sprays into both nostrils daily. (Patient not taking: Reported on 01/12/2023), Disp: 16 g, Rfl: 6   Insulin Glargine (BASAGLAR KWIKPEN) 100 UNIT/ML, Inject 100 Units into the skin daily., Disp: 30 mL, Rfl: 6   JARDIANCE 25 MG TABS tablet, Take 1 tablet (25 mg total) by mouth daily., Disp: 90 tablet, Rfl: 2   methylPREDNISolone (MEDROL DOSEPAK) 4 MG TBPK tablet, Day 1: Take 8 mg (2 tablets) before breakfast, 4 mg (1 tablet) after lunch, 4 mg (1 tablet) after supper, and 8 mg (2 tablets) at bedtime. Day 2:Take 4 mg (1 tablet) before breakfast, 4 mg (1 tablet) after lunch, 4 mg (1 tablet) after supper, and 8 mg (2 tablets) at bedtime. Day 3: Take 4 mg (1 tablet) before breakfast, 4 mg (1 tablet) after lunch, 4 mg (1 tablet) after supper, and 4 mg (1 tablet) at bedtime. Day 4: Take 4 mg (1 tablet) before breakfast, 4 mg (1 tablet) after lunch, and 4 mg (1 tablet) at bedtime. Day 5: Take 4 mg (1 tablet) before breakfast and 4 mg (1 tablet) at bedtime. Day 6: Take 4 mg (1 tablet) before breakfast. (Patient not taking: Reported on 01/12/2023), Disp: 21 tablet, Rfl: 0   montelukast (SINGULAIR) 10 MG tablet, Take 1 tablet (10 mg total) by mouth at bedtime., Disp: 90 tablet, Rfl: 1   Multiple Vitamins-Minerals (MULTIVITAMIN WITH MINERALS) tablet, Take 1 tablet by mouth daily., Disp: 100 tablet, Rfl: 0   nitroGLYCERIN (NITROSTAT) 0.4 MG SL tablet, PLACE 1 TABLET UNDER THE TONGUE EVERY 5 MINUTES AS NEEDED FOR CHEST PAIN., Disp: 25 tablet, Rfl: 2   spironolactone (ALDACTONE) 25 MG tablet, Take 1 tablet (25 mg  total) by mouth daily., Disp: 90 tablet, Rfl: 3  No Known Allergies  I personally reviewed active problem list, medication list, allergies, family history, social history, health maintenance with the patient/caregiver today.   ROS  ***  Objective  There were no vitals filed for this visit.  There is no height or weight on file to calculate BMI.  Physical Exam ***   PHQ2/9:    11/30/2022    1:36 PM 10/29/2022    9:58 AM 08/06/2022    9:39 AM 05/06/2022   10:03 AM 05/01/2022    8:47 AM  Depression screen PHQ 2/9  Decreased Interest 0 0 0 0 0  Down, Depressed, Hopeless 0 0 0 0 0  PHQ - 2 Score 0 0 0 0 0  Altered sleeping 0 0 0 0 0  Tired, decreased energy 0 0 0 0 0  Change in appetite 0 0 0 0 0  Feeling bad or failure about yourself  0 0 0 0 0  Trouble concentrating 0 0 0 0 0  Moving slowly or fidgety/restless 0 0 0 0 0  Suicidal thoughts 0 0 0 0 0  PHQ-9 Score 0 0 0 0 0  Difficult doing work/chores Not difficult at all        phq 9 is {gen pos [id number]   Fall Risk:    11/30/2022    1:36 PM 10/29/2022    9:47 AM 08/06/2022    9:39 AM 05/06/2022   10:03 AM 05/01/2022    8:39 AM  Fall Risk   Falls in the past year? 0 0 0 0 0  Number falls in past yr: 0  0 0   Injury with Fall? 0  0 0   Risk for fall due to :  No Fall Risks No Fall Risks No Fall Risks No Fall Risks  Follow up  Falls prevention discussed;Education provided;Falls evaluation completed Falls prevention discussed Falls prevention discussed Falls prevention discussed;Education provided;Falls evaluation completed      Functional Status Survey:      Assessment & Plan  *** There are no diagnoses linked to this encounter.

## 2023-01-26 ENCOUNTER — Ambulatory Visit: Payer: Medicaid Other | Admitting: Family Medicine

## 2023-01-29 ENCOUNTER — Telehealth: Payer: Self-pay

## 2023-01-29 NOTE — Progress Notes (Signed)
   Care Guide Note  01/29/2023 Name: Sarah Duffy MRN: [id number] DOB: 1971-01-26  Referred by: Steele Sizer, MD Reason for referral : No chief complaint on file.   Sarah Duffy is a 52 y.o. year old female who is a primary care patient of Steele Sizer, MD. Sarah Duffy was referred to the pharmacist for assistance related to DM.    An unsuccessful telephone outreach was attempted today to contact the patient who was referred to the pharmacy team for assistance with medication management. Additional attempts will be made to contact the patient.   Sarah Duffy, [place], [place] [address] Direct Dial: [phone] [email or web address]

## 2023-01-29 NOTE — Progress Notes (Signed)
   Care Guide Note  01/29/2023 Name: [name] MRN: [id number] DOB: 10/09/1971  Referred by: Steele Sizer, MD Reason for referral : Care Coordination (Outreach to schedule with pharm d New MM DM )   Sarah Duffy is a 51 y.o. year old female who is a primary care patient of Steele Sizer, MD. Sarah Duffy was referred to the pharmacist for assistance related to DM.    Successful contact was made with the patient to discuss pharmacy services including being ready for the pharmacist to call at least 5 minutes before the scheduled appointment time, to have medication bottles and any blood sugar or blood pressure readings ready for review. The patient agreed to meet with the pharmacist via with the pharmacist via telephone visit on (date/time).  02/24/2023  Sarah Duffy, [place], [place] [address] Direct Dial: [phone] [email or web address]

## 2023-02-18 ENCOUNTER — Telehealth: Payer: 59 | Admitting: Nurse Practitioner

## 2023-02-22 ENCOUNTER — Ambulatory Visit
Admission: EM | Admit: 2023-02-22 | Discharge: 2023-02-22 | Disposition: A | Discharge status: Home / Self Care | Payer: Medicaid Other | Attending: Urgent Care | Admitting: Urgent Care

## 2023-02-22 DIAGNOSIS — J069 Acute upper respiratory infection, unspecified: Secondary | ICD-10-CM

## 2023-02-22 DIAGNOSIS — R051 Acute cough: Secondary | ICD-10-CM | POA: Diagnosis not present

## 2023-02-22 DIAGNOSIS — R6889 Other general symptoms and signs: Secondary | ICD-10-CM

## 2023-02-22 MED ORDER — ONDANSETRON 4 MG PO TBDP: 4.0000 mg | ORAL_TABLET | Freq: Three times a day (TID) | ORAL | 0 refills | Status: DC | PRN

## 2023-02-22 MED ORDER — BENZONATATE 100 MG PO CAPS: ORAL_CAPSULE | ORAL | 0 refills | Status: DC

## 2023-02-22 MED ORDER — HYDROCOD POLI-CHLORPHE POLI ER 10-8 MG/5ML PO SUER: 5.0000 mL | Freq: Two times a day (BID) | ORAL | 0 refills | Status: AC | PRN

## 2023-02-22 NOTE — ED Provider Notes (Signed)
Roderic Palau    CSN: [id number] Arrival date & time: 02/22/23  1632      History   Chief Complaint Chief Complaint  Patient presents with   Cough    Headache,vomiting,fatigued,aching,loss of appetite - Entered by patient   Headache   Generalized Body Aches    HPI Sarah Duffy is a 52 y.o. female.    Cough Associated symptoms: headaches   Headache Associated symptoms: cough     Presents to urgent care with headache, cough, vomiting, fatigue, body aches x 3 days.  Treating symptoms with TheraFlu and Tylenol.  PMH includes DM 2, CAD, HFrEF.  She states she has not vomited or had fever since Saturday.  Avoiding eating or drinking.  No fluids or food today at all.  Past Medical History:  Diagnosis Date   Chronic HFimpEF (heart failure with improved ejection fraction) ([place])    a. 02/2021 Echo: EF 35-40%, glob HK, GrI DD, nl RV fxn, mildly dil LA, mod MR; b. 06/2022 Echo: EF 50-55%, no rwma, GrII DD, nl RV fxn, RVSP 13mHg, mildly dil LA, mild MR, AoV sclerosis.   Coronary artery disease    a. 04/2021 Cath: LM nl, LAD 70p/m, 622m80d, LCX 3092mM1 40, OM2 99 (fills via collats from OM1), RCA 60d, RPDA 75.   COVID-19 2021   Hyperlipidemia LDL goal <70    Hypertension    Ischemic cardiomyopathy    a. 02/2021 Echo: EF 35-40%, glob HK, GrI DD; b. 06/2022 Echo: EF 50-55%.   Moderate mitral regurgitation    a. 02/2021 Echo: Mod MR.   MRSA infection within last 3 months 02/25/2016   Osteomyelitis of foot (HCCHope Mills9/13/2017   Type II diabetes mellitus ([facility]   Patient Active Problem List   Diagnosis Date Noted   Type 2 diabetes mellitus with diabetic polyneuropathy, with long-term current use of insulin (HCC[place]9/18/2023   Type 2 diabetes mellitus with retinopathy, with long-term current use of insulin (HCCWest Baton Rouge9/18/2023   Diabetes mellitus (HCC[place]9/18/2023   Hypertension 06/11/2022   Ischemic cardiomyopathy 06/11/2022   Coronary artery disease 06/11/2022   Chronic HFrEF  (heart failure with reduced ejection fraction) (HCCStanford6/29/2023   Angina pectoris associated with type 2 diabetes mellitus (HCCRichmond Hill1/23/2023   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 edema associated with type 2 diabetes mellitus (HCCJeff[place]5/19/2022   Cardiomyopathy (HCC[place]5/01/2021   Depression, major, recurrent, mild (HCC[place]0/11/2019   History of 2019 novel coronavirus disease (COVID-19) 06/23/2019   Migraine headache without aura 01/14/2018   Type II diabetes mellitus (HCCMille Lacs6/08/2017   Diabetic retinopathy of left eye, with macular edema, with severe nonproliferative retinopathy, associated with type 2 diabetes mellitus 05/21/2016   Severe nonproliferative diabetic retinopathy of right eye without macular edema associated with type 2 diabetes mellitus (HCCQuail Ridge6/07/2016   Hyperlipidemia LDL goal <70 03/10/2016   MI (mitral incompetence) 02/25/2016   Insomnia 02/25/2016   Anxiety 09/04/2014    Past Surgical History:  Procedure Laterality Date   CHOLECYSTECTOMY  1999   COLONOSCOPY WITH PROPOFOL N/A 03/25/2021   Procedure: COLONOSCOPY WITH PROPOFOL;  Surgeon: AnnJonathon BellowsD;  [facility];  Service: Gastroenterology;  Laterality: N/A;   COLONOSCOPY WITH PROPOFOL N/A 04/17/2021   Procedure: COLONOSCOPY WITH PROPOFOL;  Surgeon: AnnJonathon BellowsD;  [facility];  Service: Gastroenterology;  Laterality: N/A;   ESOPHAGOGASTRODUODENOSCOPY (EGD) WITH PROPOFOL N/A 03/25/2021   Procedure: ESOPHAGOGASTRODUODENOSCOPY (EGD) WITH PROPOFOL;  Surgeon: AnnJonathon BellowsD;  Location:  [facility] ENDOSCOPY;  Service: Gastroenterology;  Laterality: N/A;   ESOPHAGOGASTRODUODENOSCOPY (EGD) WITH PROPOFOL N/A 04/17/2021   Procedure: ESOPHAGOGASTRODUODENOSCOPY (EGD) WITH PROPOFOL;  Surgeon: Jonathon Bellows, MD;  Location[facility] END[facility]COPY;  Service: Gastroenterology;  Laterality: N/A;   RIGHT/LEFT HEART CATH AND CORONARY ANGIOGRAPHY Bilateral 04/14/2021   Procedure: RIGHT/LEFT HEART CATH AND  CORONARY ANGIOGRAPHY;  Surgeon: Nelva Bush, MD;  Location: [facility] CV LAB;  Service: Cardiovascular;  Laterality: Bilateral;   TOE SURGERY Left 02/07/2016   [place]    OB History   No obstetric history on file.      Home Medications    Prior to Admission medications   Medication Sig Start Date End Date Taking? Authorizing Provider  albuterol (VENTOLIN HFA) 108 (90 Base) MCG/ACT inhaler Inhale 2 puffs into the lungs every 6 (six) hours as needed for wheezing or shortness of breath. 11/30/22   Bo Merino, FNP  aspirin EC 81 MG tablet Take 1 tablet (81 mg total) by mouth daily. Swallow whole. 08/11/22   Theora Gianotti, NP  atorvastatin (LIPITOR) 80 MG tablet Take 1 tablet (80 mg total) by mouth daily. 10/29/22   Steele Sizer, MD  benzonatate (TESSALON) 100 MG capsule Take 1 capsule (100 mg total) by mouth 2 (two) times daily as needed for cough. Patient not taking: Reported on 01/12/2023 10/29/22   Steele Sizer, MD  blood glucose meter kit and supplies Dispense based on patient and insurance preference. Use up to four times daily as directed. (FOR ICD-10 E10.9, E11.9). 08/21/21   Steele Sizer, MD  carvedilol (COREG) 12.5 MG tablet Take 1 tablet (12.5 mg total) by mouth 2 (two) times daily. 08/11/22   Theora Gianotti, NP  Continuous Blood Gluc Sensor (DEXCOM G7 SENSOR) MISC 1 Device by Does not apply route as directed. 08/31/22   [name], Melanie Crazier, MD  Dulaglutide (TRULICITY) 3 0000000 SOPN Inject 3 mg as directed once a week. To replace victoza 08/31/22   [name], Melanie Crazier, MD  ENTRESTO 24-26 MG Take 1 tablet by mouth 2 (two) times daily. 10/24/22   [provider]  ezetimibe (ZETIA) 10 MG tablet Take 1 tablet (10 mg total) by mouth daily. Patient not taking: Reported on 01/12/2023 10/29/22   Steele Sizer, MD  fluticasone [facility]) 50 MCG/ACT nasal spray Place 2 sprays into both nostrils daily. Patient not taking:  Reported on 01/12/2023 05/06/22   Steele Sizer, MD  Insulin Glargine [facility]) 100 UNIT/ML Inject 100 Units into the skin daily. 09/04/22   [name], Melanie Crazier, MD  JARDIANCE 25 MG TABS tablet Take 1 tablet (25 mg total) by mouth daily. 08/31/22   [name], Melanie Crazier, MD  methylPREDNISolone (MEDROL DOSEPAK) 4 MG TBPK tablet Day 1: Take 8 mg (2 tablets) before breakfast, 4 mg (1 tablet) after lunch, 4 mg (1 tablet) after supper, and 8 mg (2 tablets) at bedtime. Day 2:Take 4 mg (1 tablet) before breakfast, 4 mg (1 tablet) after lunch, 4 mg (1 tablet) after supper, and 8 mg (2 tablets) at bedtime. Day 3: Take 4 mg (1 tablet) before breakfast, 4 mg (1 tablet) after lunch, 4 mg (1 tablet) after supper, and 4 mg (1 tablet) at bedtime. Day 4: Take 4 mg (1 tablet) before breakfast, 4 mg (1 tablet) after lunch, and 4 mg (1 tablet) at bedtime. Day 5: Take 4 mg (1 tablet) before breakfast and 4 mg (1 tablet) at bedtime. Day 6: Take 4 mg (1 tablet) before breakfast. Patient not taking: Reported on  01/12/2023 11/30/22   Bo Merino, FNP  montelukast (SINGULAIR) 10 MG tablet Take 1 tablet (10 mg total) by mouth at bedtime. 10/29/22   Steele Sizer, MD  Multiple Vitamins-Minerals (MULTIVITAMIN WITH MINERALS) tablet Take 1 tablet by mouth daily. 05/29/20   Caren Macadam, MD  nitroGLYCERIN (NITROSTAT) 0.4 MG SL tablet PLACE 1 TABLET UNDER THE TONGUE EVERY 5 MINUTES AS NEEDED FOR CHEST PAIN. 12/29/22   Kate Sable, MD  spironolactone (ALDACTONE) 25 MG tablet Take 1 tablet (25 mg total) by mouth daily. 10/12/22   Kate Sable, MD    Family History Family History  Problem Relation Age of Onset   Diabetes Mother    Ulcers Mother    Heart disease Father    AAA (abdominal aortic aneurysm) Father    Diabetes Father    Hypertension Father    Stroke Father    Alzheimer's disease Father    Heart attack Sister    Seizures Brother    Diabetes Maternal Grandmother     Breast cancer Maternal Grandmother     Social History Social History   Tobacco Use   Smoking status: Never   Smokeless tobacco: Never  Vaping Use   Vaping Use: Never used  Substance Use Topics   Alcohol use: No    Alcohol/week: 0.0 standard drinks of alcohol   Drug use: No     Allergies   Patient has no known allergies.   Review of Systems Review of Systems  Respiratory:  Positive for cough.   Neurological:  Positive for headaches.     Physical Exam Triage Vital Signs ED Triage Vitals  Enc Vitals Group     BP 02/22/23 1646 133/77     Pulse Rate 02/22/23 1646 73     Resp 02/22/23 1646 18     Temp 02/22/23 1646 98.9 F (37.2 C)     Temp Source 02/22/23 1646 Oral     SpO2 02/22/23 1646 95 %     Weight --      Height --      Head Circumference --      Peak Flow --      Pain Score 02/22/23 1645 0     Pain Loc --      Pain Edu? --      Excl. in [place]? --    No data found.  Updated Vital Signs BP 133/77 (BP Location: Right Arm)   Pulse 73   Temp 98.9 F (37.2 C) (Oral)   Resp 18   SpO2 95%   Visual Acuity Right Eye Distance:   Left Eye Distance:   Bilateral Distance:    Right Eye Near:   Left Eye Near:    Bilateral Near:     Physical Exam Vitals reviewed.  Constitutional:      Appearance: She is well-developed.  Cardiovascular:     Rate and Rhythm: Normal rate and regular rhythm.     Heart sounds: Normal heart sounds.  Pulmonary:     Effort: Pulmonary effort is normal.     Breath sounds: Normal breath sounds.  Skin:    General: Skin is warm and dry.  Neurological:     Mental Status: She is alert and oriented to person, place, and time.  Psychiatric:        Mood and Affect: Mood normal.        Behavior: Behavior normal.      UC Treatments / Results  Labs (all labs ordered are listed, but  only abnormal results are displayed) Labs Reviewed - No data to display  EKG   Radiology No results found.  Procedures Procedures (including  critical care time)  Medications Ordered in UC Medications - No data to display  Initial Impression / Assessment and Plan / UC Course  I have reviewed the triage vital signs and the nursing notes.  Pertinent labs & imaging results that were available during my care of the patient were reviewed by me and considered in my medical decision making (see chart for details).   Patient is afebrile here without recent antipyretics. Satting well on room air. Overall is well appearing, well hydrated, without respiratory distress. Pulmonary exam is unremarkable.  Lungs CTAB without wheezing, rhonchi, rales.  Dry mucous membranes.  Patient's symptoms are concerning for an acute viral process including flu or COVID.  She is outside the treatment window for flu and nearly so for COVID.  Most concerning symptom for her is her cough and will prescribe benzonatate and Tussionex to control her symptoms.  I am most concerned about her lack of fluids in the last few days and asking her to push fluids.  Will prescribe Zofran to better control her nausea to promote that effort.  Final Clinical Impressions(s) / UC Diagnoses   Final diagnoses:  None   Discharge Instructions   None    ED Prescriptions   None    PDMP not reviewed this encounter.   Rose Phi, [place] 02/22/23 1711

## 2023-02-22 NOTE — ED Triage Notes (Signed)
Patient presents to UC for HA, cough, vomiting, fatigue, body aches, ad fatigue x 3 days. Treating symptoms with theraflu and tylenol.

## 2023-02-22 NOTE — Discharge Instructions (Addendum)
Follow up here or with your primary care provider if your symptoms are worsening or not improving.     

## 2023-02-23 ENCOUNTER — Ambulatory Visit: Payer: 59 | Admitting: Family Medicine

## 2023-02-23 DIAGNOSIS — E113512 Type 2 diabetes mellitus with proliferative diabetic retinopathy with macular edema, left eye: Secondary | ICD-10-CM | POA: Diagnosis not present

## 2023-02-23 DIAGNOSIS — E113511 Type 2 diabetes mellitus with proliferative diabetic retinopathy with macular edema, right eye: Secondary | ICD-10-CM | POA: Diagnosis not present

## 2023-02-24 ENCOUNTER — Telehealth: Payer: Self-pay

## 2023-02-24 ENCOUNTER — Other Ambulatory Visit: Payer: 59 | Admitting: Pharmacist

## 2023-02-24 ENCOUNTER — Telehealth: Payer: Self-pay | Admitting: Pharmacist

## 2023-02-24 NOTE — Progress Notes (Signed)
   Outreach Note  02/24/2023 Name: Pearlee Arvizu MRN: [id number] DOB: [birth year]  Referred by: Steele Sizer, MD Reason for referral : No chief complaint on file.   Was unable to reach patient via telephone today and have left HIPAA compliant voicemail asking patient to return my call.    Follow Up Plan: Will collaborate with Care Guide to outreach to schedule follow up with me  Wallace Cullens, PharmD, [place] [phone]

## 2023-02-24 NOTE — Telephone Encounter (Signed)
Contact patient to schedule appointment  

## 2023-02-25 ENCOUNTER — Telehealth: Payer: Medicaid Other | Admitting: Physician Assistant

## 2023-02-25 DIAGNOSIS — J069 Acute upper respiratory infection, unspecified: Secondary | ICD-10-CM

## 2023-02-25 NOTE — Progress Notes (Signed)
Because of ongoing symptoms for many days now and because you are outside the time window where we can start antiviral medications (in case of flu or COVID), I feel your condition warrants further evaluation and I recommend that you either submit a video visit so the provider can further assess things, or be seen in a face to face visit, either with your PCP or at an urgent care.   NOTE: There will be NO CHARGE for this eVisit   If you are having a true medical emergency please call 911.      For an urgent face to face visit, [place] has eight urgent care centers for your convenience:   NEW!! [facility] [facility] [place] Get Driving Directions [id number] [address] [place], [place] [facility] [place] Get Driving Directions [id number] [place], [place] [address]   [place] [facility]) Get Driving Directions [id number] [address], [place] [address]  [place] [facility] [place] ([place]) Get Driving Directions [id number] [address] [place],  [place]  [address]  [place] [facility] [place] [facility] - at [address] Get Driving Directions  [phone] [address] [place],  [place] [address]   [facility]  Get Driving Directions [id number] [place], [place], [place] [address]   [facility] [place] Get Driving Directions  [id number] [address] [place], [place] [address]   [facility] [place] Get Driving Directions [id number] [address] [place], [place] [address]  Your MyChart E-visit questionnaire answers were reviewed by a board certified advanced clinical practitioner to complete your personal care plan based on your specific symptoms.   Thank you for using e-Visits.

## 2023-02-26 ENCOUNTER — Ambulatory Visit (INDEPENDENT_AMBULATORY_CARE_PROVIDER_SITE_OTHER): Payer: 59 | Admitting: Nurse Practitioner

## 2023-02-26 ENCOUNTER — Encounter: Payer: Self-pay | Admitting: Nurse Practitioner

## 2023-02-26 VITALS — BP 126/82 | HR 82 | Temp 98.2°F | Resp 18 | Ht 69.0 in | Wt 178.0 lb

## 2023-02-26 DIAGNOSIS — R051 Acute cough: Secondary | ICD-10-CM

## 2023-02-26 DIAGNOSIS — J069 Acute upper respiratory infection, unspecified: Secondary | ICD-10-CM

## 2023-02-26 LAB — POCT INFLUENZA A/B
Influenza A, POC: NEGATIVE
Influenza B, POC: NEGATIVE

## 2023-02-26 MED ORDER — METHYLPREDNISOLONE 4 MG PO TBPK: ORAL_TABLET | ORAL | 0 refills | Status: DC

## 2023-02-26 MED ORDER — BENZONATATE 100 MG PO CAPS: ORAL_CAPSULE | ORAL | 0 refills | Status: DC

## 2023-02-26 NOTE — Progress Notes (Signed)
BP 126/82   Pulse 82   Temp 98.2 F (36.8 C) (Oral)   Resp 18   Ht 5\' 9"  (1.753 m)   Wt 178 lb (80.7 kg)   SpO2 94%   BMI 26.29 kg/m    Subjective:    Patient ID: Sarah Duffy, female    DOB: August 05, 1971, 52 y.o.   MRN: [id number]  HPI: Sarah Duffy is a 52 y.o. female  Chief Complaint  Patient presents with   Cough    Body aches, fever, headache for 5 days. Seen at [facility]   URI: she has had a low grade fever, cough, body aches and nasal congestion. She says she feels like it is moving into her chest.   She says it started symptoms on Saturday.  She has been taking sinus medication and robitussin, theraflu. She says she started getting nauseated and was unable to eat.  Patient was seen at urgent care on 02/22/2023.  She was treated with tussionex and tessalon perls. She was also prescribed zofran for nausea.  She says she that her stomach is doing much better. She still has a cough, had a fever last night.  She says that she feels worse at night.   Recommend taking zyrtec, flonase, mucinex, vitamin d, vitamin c, and zinc. Push fluids and get rest.   Flu test negative, covid pending.  She says she did not take the tussinex due to her upset stomach but her stomach is feeling better now. Recommend taking the tussinex at night.  Will refill tessalon perls and steroid taper.    Relevant past medical, surgical, family and social history reviewed and updated as indicated. Interim medical history since our last visit reviewed. Allergies and medications reviewed and updated.  Review of Systems  Constitutional: positive for fever . Negative for  weight change.  HEENT: positive for nasal congestion Respiratory: positive for cough and negative for shortness of breath.   Cardiovascular: Negative for chest pain or palpitations.  Gastrointestinal: Negative for abdominal pain, no bowel changes.  Musculoskeletal: Negative for gait problem or joint swelling.  Skin: Negative for rash.   Neurological: Negative for dizziness or headache.  No other specific complaints in a complete review of systems (except as listed in HPI above).      Objective:    BP 126/82   Pulse 82   Temp 98.2 F (36.8 C) (Oral)   Resp 18   Ht 5\' 9"  (1.753 m)   Wt 178 lb (80.7 kg)   SpO2 94%   BMI 26.29 kg/m   Wt Readings from Last 3 Encounters:  02/26/23 178 lb (80.7 kg)  01/12/23 178 lb (80.7 kg)  11/30/22 175 lb 12.8 oz (79.7 kg)    Physical Exam  Constitutional: Patient appears well-developed and well-nourished.  No distress.  HEENT: head atraumatic, normocephalic, pupils equal and reactive to light, ears TMs clear, neck supple, throat within normal limits Cardiovascular: Normal rate, regular rhythm and normal heart sounds.  No murmur heard. No BLE edema. Pulmonary/Chest: Effort normal and breath sounds normal. No respiratory distress. Abdominal: Soft.  There is no tenderness. Psychiatric: Patient has a normal mood and affect. behavior is normal. Judgment and thought content normal.  Results for orders placed or performed in visit on 02/26/23  POCT Influenza A/B  Result Value Ref Range   Influenza A, POC Negative Negative   Influenza B, POC Negative Negative      Assessment & Plan:   Problem List Items Addressed This Visit  None Visit Diagnoses     Acute cough    -  Primary   Recommend taking the tussinex at night, tessalon perls and steroid taper zyrtec, flonase, mucinex, vitamin d, vitamin c, and zinc. Push fluids and get rest.   Relevant Medications   benzonatate (TESSALON) 100 MG capsule   Other Relevant Orders   POCT Influenza A/B (Completed)   Novel Coronavirus, NAA ([facility])   Upper respiratory tract infection, unspecified type       push fluids, take xyzal and singulair daily. start using albuterol inhaler, may use tessalon perls. awaiting covid results, will send in treatment if positive   Relevant Medications   methylPREDNISolone (MEDROL DOSEPAK) 4 MG TBPK  tablet        Follow up plan: Return if symptoms worsen or fail to improve.

## 2023-02-28 ENCOUNTER — Other Ambulatory Visit: Payer: Self-pay | Admitting: Family Medicine

## 2023-02-28 DIAGNOSIS — B009 Herpesviral infection, unspecified: Secondary | ICD-10-CM

## 2023-03-02 LAB — NOVEL CORONAVIRUS, NAA: SARS-CoV-2, NAA: NOT DETECTED

## 2023-03-03 ENCOUNTER — Encounter: Payer: Self-pay | Admitting: Nurse Practitioner

## 2023-03-03 ENCOUNTER — Ambulatory Visit: Payer: Medicaid Other | Attending: Nurse Practitioner | Admitting: Nurse Practitioner

## 2023-03-03 ENCOUNTER — Other Ambulatory Visit
Admission: RE | Admit: 2023-03-03 | Discharge: 2023-03-03 | Disposition: A | Discharge status: Home / Self Care | Payer: 59 | Attending: Nurse Practitioner | Admitting: Nurse Practitioner

## 2023-03-03 ENCOUNTER — Telehealth: Payer: Self-pay | Admitting: Gastroenterology

## 2023-03-03 VITALS — BP 104/68 | HR 82 | Ht 69.0 in | Wt 176.6 lb

## 2023-03-03 DIAGNOSIS — E785 Hyperlipidemia, unspecified: Secondary | ICD-10-CM | POA: Diagnosis not present

## 2023-03-03 DIAGNOSIS — E1165 Type 2 diabetes mellitus with hyperglycemia: Secondary | ICD-10-CM | POA: Diagnosis not present

## 2023-03-03 DIAGNOSIS — I10 Essential (primary) hypertension: Secondary | ICD-10-CM

## 2023-03-03 DIAGNOSIS — I2510 Atherosclerotic heart disease of native coronary artery without angina pectoris: Secondary | ICD-10-CM | POA: Diagnosis not present

## 2023-03-03 DIAGNOSIS — I5022 Chronic systolic (congestive) heart failure: Secondary | ICD-10-CM

## 2023-03-03 DIAGNOSIS — Z794 Long term (current) use of insulin: Secondary | ICD-10-CM | POA: Diagnosis not present

## 2023-03-03 DIAGNOSIS — N183 Chronic kidney disease, stage 3 unspecified: Secondary | ICD-10-CM

## 2023-03-03 DIAGNOSIS — I255 Ischemic cardiomyopathy: Secondary | ICD-10-CM | POA: Diagnosis not present

## 2023-03-03 LAB — BASIC METABOLIC PANEL
Anion gap: 6 (ref 5–15)
BUN: 25 mg/dL — ABNORMAL HIGH (ref 6–20)
CO2: 21 mmol/L — ABNORMAL LOW (ref 22–32)
Calcium: 8.7 mg/dL — ABNORMAL LOW (ref 8.9–10.3)
Chloride: 109 mmol/L (ref 98–111)
Creatinine, Ser: 1.1 mg/dL — ABNORMAL HIGH (ref 0.44–1.00)
GFR, Estimated: >60 mL/min (ref >60)
Glucose, Bld: 237 mg/dL — ABNORMAL HIGH (ref 70–99)
Potassium: 4.3 mmol/L (ref 3.5–5.1)
Sodium: 136 mmol/L (ref 135–145)

## 2023-03-03 MED ORDER — SUCRALFATE 1 GM/10ML PO SUSP: 1.0000 g | Freq: Four times a day (QID) | ORAL | 1 refills | Status: DC

## 2023-03-03 MED ORDER — OMEPRAZOLE 40 MG PO CPDR: 40.0000 mg | DELAYED_RELEASE_CAPSULE | Freq: Two times a day (BID) | ORAL | 0 refills | Status: DC

## 2023-03-03 NOTE — Telephone Encounter (Signed)
Patient verbalized understanding of instructions. Patient states she is not taking omeprazole 40mg  because at her colonoscopy Dr. Vicente Males told her if she was not having symptoms to stop the medication so she did. Called in medications for omeprazole and Carfate to the pharmacy. If she is not better by Friday she will call us and let us know and we will scheduled a EGD

## 2023-03-03 NOTE — Addendum Note (Signed)
Addended by: Ulyess Blossom L on: 03/03/2023 12:50 PM   Modules accepted: Orders

## 2023-03-03 NOTE — Telephone Encounter (Signed)
Pt would like call back having problems with throat blockage she was wondering if something could be called in or does she need appointment Would like a call back

## 2023-03-03 NOTE — Telephone Encounter (Signed)
Patient states the throat blockage has been going on for 1 week. He states she diagnosed with the flu on 02/19/2023. She states they had her on steroids. Last week she started noticing she was constipated and feeling like food is getting stuck in her throat. She states she is having really bad heart burn also. She is chewing her food good. Tried tums but they were not helping and also tried drinking vinegar. She states that she feels like she has a obstruction in her throat. She wants to know what your recommend.

## 2023-03-03 NOTE — Progress Notes (Signed)
Office Visit    Patient Name: Elda Hardmon Date of Encounter: 03/03/2023  Primary Care Provider:  Steele Sizer, MD Primary Cardiologist:  Kate Sable, MD  Chief Complaint    52 year old female with history of CAD, chronic heart failure with improved EF, ischemic cardiomyopathy, hypertension, hyperlipidemia, diabetes, CKD III, and MRSA/osteomyelitis of the foot (2017), who presents for CAD and heart failure follow-up.  Past Medical History    Past Medical History:  Diagnosis Date   Chronic HFimpEF (heart failure with improved ejection fraction) ([place])    a. 02/2021 Echo: EF 35-40%, glob HK, GrI DD, nl RV fxn, mildly dil LA, mod MR; b. 06/2022 Echo: EF 50-55%, no rwma, GrII DD, nl RV fxn, RVSP 39mmHg, mildly dil LA, mild MR, AoV sclerosis.   Coronary artery disease    a. 04/2021 Cath: LM nl, LAD 70p/m, 58m, 80d, LCX 47m, OM1 40, OM2 99 (fills via collats from OM1), RCA 60d, RPDA 75.   COVID-19 2021   Hyperlipidemia LDL goal <70    Hypertension    Ischemic cardiomyopathy    a. 02/2021 Echo: EF 35-40%, glob HK, GrI DD; b. 06/2022 Echo: EF 50-55%.   Moderate mitral regurgitation    a. 02/2021 Echo: Mod MR.   MRSA infection within last 3 months 02/25/2016   Osteomyelitis of foot ([place]) 08/26/2016   Type II diabetes mellitus ([place])    Past Surgical History:  Procedure Laterality Date   CHOLECYSTECTOMY  1999   COLONOSCOPY WITH PROPOFOL N/A 03/25/2021   Procedure: COLONOSCOPY WITH PROPOFOL;  Surgeon: Jonathon Bellows, MD;  Location: [facility] ENDOSCOPY;  Service: Gastroenterology;  Laterality: N/A;   COLONOSCOPY WITH PROPOFOL N/A 04/17/2021   Procedure: COLONOSCOPY WITH PROPOFOL;  Surgeon: Jonathon Bellows, MD;  Location: [facility] ENDOSCOPY;  Service: Gastroenterology;  Laterality: N/A;   ESOPHAGOGASTRODUODENOSCOPY (EGD) WITH PROPOFOL N/A 03/25/2021   Procedure: ESOPHAGOGASTRODUODENOSCOPY (EGD) WITH PROPOFOL;  Surgeon: Jonathon Bellows, MD;  Location: [facility] ENDOSCOPY;  Service: Gastroenterology;  Laterality:  N/A;   ESOPHAGOGASTRODUODENOSCOPY (EGD) WITH PROPOFOL N/A 04/17/2021   Procedure: ESOPHAGOGASTRODUODENOSCOPY (EGD) WITH PROPOFOL;  Surgeon: Jonathon Bellows, MD;  Location: [facility] ENDOSCOPY;  Service: Gastroenterology;  Laterality: N/A;   RIGHT/LEFT HEART CATH AND CORONARY ANGIOGRAPHY Bilateral 04/14/2021   Procedure: RIGHT/LEFT HEART CATH AND CORONARY ANGIOGRAPHY;  Surgeon: Nelva Bush, MD;  Location: [place] CV LAB;  Service: Cardiovascular;  Laterality: Bilateral;   TOE SURGERY Left 02/07/2016   Pinky Toe   Allergies  No Known Allergies  History of Present Illness    52 year old female with the above complex past medical history clinical CAD, chronic heart failure with improved EF, ischemic remapped, hypertension, hyperlipidemia, diabetes, CKD III, and MRSA/osteomyelitis of the foot in 2017.  She was previously evaluated in March 2022 in the setting of chest discomfort that started following the death of her mother.  Echo showed an EF of 35 to 40% with global hypokinesis and grade 1 diastolic dysfunction.  Diagnostic catheterization was performed and showed severe multivessel CAD as outlined above in the past medical history.  She was referred to CT surgery and saw Dr. Cyndia Bent in September 2022 with recommendation for CABG.  This was initially scheduled for October 2022, but was subsequently canceled secondary to staff illness.  Echo in July 2023 showed an EF of 50 to 55% with grade 2 diastolic dysfunction, and mild MR.  She was referred back to CT surgery in August 2023, and deferred proceeding with CABG due to change in her medical insurance.  Ms. Deutsch was last  seen in cardiology clinic in January 2024, at which time she was doing well.  She reported a change in insurance with plan to contact CT surgery.  Amlodipine was discontinued with plan to titrate Entresto in the future.  Since her last visit, Ms. Danese notes that she has done well from a cardiac standpoint.  She was diagnosed with  influenza A last week and has since recovered.  During her respiratory infection, she noted pleuritic chest discomfort with coughing but otherwise denies angina or dyspnea.  She has yet to make follow-up appointment with CT surgery because of ongoing issues with insurance.  She is now working full-time at [employer] and will have study insurance through them going forward.  She denies palpitations, PND, orthopnea, dizziness, syncope, edema, or early satiety.    Home Medications    Prior to Admission medications   Medication Sig Start Date End Date Taking? Authorizing Provider  albuterol (VENTOLIN HFA) 108 (90 Base) MCG/ACT inhaler Inhale 2 puffs into the lungs every 6 (six) hours as needed for wheezing or shortness of breath. 11/30/22  Yes Bo Merino, FNP  aspirin EC 81 MG tablet Take 1 tablet (81 mg total) by mouth daily. Swallow whole. 08/11/22  Yes Theora Gianotti, NP  atorvastatin (LIPITOR) 80 MG tablet Take 1 tablet (80 mg total) by mouth daily. 10/29/22  Yes Steele Sizer, MD  blood glucose meter kit and supplies Dispense based on patient and insurance preference. Use up to four times daily as directed. (FOR ICD-10 E10.9, E11.9). 08/21/21  Yes [name], Drue Stager, MD  carvedilol (COREG) 12.5 MG tablet Take 1 tablet (12.5 mg total) by mouth 2 (two) times daily. 08/11/22  Yes Theora Gianotti, NP  Continuous Blood Gluc Sensor (DEXCOM G7 SENSOR) MISC 1 Device by Does not apply route as directed. 08/31/22  Yes [name], Melanie Crazier, MD  ENTRESTO 24-26 MG Take 1 tablet by mouth 2 (two) times daily. 10/24/22  Yes [provider]  fluticasone (FLONASE) 50 MCG/ACT nasal spray Place 2 sprays into both nostrils daily. 05/06/22  Yes [name], Drue Stager, MD  Insulin Glargine (BASAGLAR KWIKPEN) 100 UNIT/ML Inject 100 Units into the skin daily. 09/04/22  Yes [name], Melanie Crazier, MD  JARDIANCE 25 MG TABS tablet Take 1 tablet (25 mg total) by mouth daily. 08/31/22  Yes  [name], Melanie Crazier, MD  methylPREDNISolone (MEDROL DOSEPAK) 4 MG TBPK tablet Day 1: Take 8 mg (2 tablets) before breakfast, 4 mg (1 tablet) after lunch, 4 mg (1 tablet) after supper, and 8 mg (2 tablets) at bedtime. Day 2:Take 4 mg (1 tablet) before breakfast, 4 mg (1 tablet) after lunch, 4 mg (1 tablet) after supper, and 8 mg (2 tablets) at bedtime. Day 3: Take 4 mg (1 tablet) before breakfast, 4 mg (1 tablet) after lunch, 4 mg (1 tablet) after supper, and 4 mg (1 tablet) at bedtime. Day 4: Take 4 mg (1 tablet) before breakfast, 4 mg (1 tablet) after lunch, and 4 mg (1 tablet) at bedtime. Day 5: Take 4 mg (1 tablet) before breakfast and 4 mg (1 tablet) at bedtime. Day 6: Take 4 mg (1 tablet) before breakfast. 02/26/23  Yes Bo Merino, FNP  montelukast (SINGULAIR) 10 MG tablet Take 1 tablet (10 mg total) by mouth at bedtime. 10/29/22  Yes [name], Drue Stager, MD  Multiple Vitamins-Minerals (MULTIVITAMIN WITH MINERALS) tablet Take 1 tablet by mouth daily. 05/29/20  Yes Caren Macadam, MD  nitroGLYCERIN (NITROSTAT) 0.4 MG SL tablet PLACE 1 TABLET UNDER  THE TONGUE EVERY 5 MINUTES AS NEEDED FOR CHEST PAIN. 12/29/22  Yes Agbor-Etang, Aaron Edelman, MD  spironolactone (ALDACTONE) 25 MG tablet Take 1 tablet (25 mg total) by mouth daily. 10/12/22  Yes Kate Sable, MD  benzonatate (TESSALON) 100 MG capsule Take 1-2 tablets 3 times a day as needed for cough Patient not taking: Reported on 03/03/2023 02/26/23   Bo Merino, FNP  Dulaglutide (TRULICITY) 3 0000000 SOPN Inject 3 mg as directed once a week. To replace victoza Patient not taking: Reported on 03/03/2023 08/31/22   [name], Melanie Crazier, MD  ezetimibe (ZETIA) 10 MG tablet Take 1 tablet (10 mg total) by mouth daily. Patient not taking: Reported on 01/12/2023 10/29/22   Steele Sizer, MD  ondansetron (ZOFRAN-ODT) 4 MG disintegrating tablet Take 1 tablet (4 mg total) by mouth every 8 (eight) hours as needed for nausea or  vomiting. Patient not taking: Reported on 03/03/2023 02/22/23   Immordino, Annie Main, FNP       Review of Systems    She denies chest pain, palpitations, dyspnea, pnd, orthopnea, n, v, dizziness, syncope, edema, weight gain, or early satiety.  All other systems reviewed and are otherwise negative except as noted above.    Physical Exam    VS:  BP 104/68 (BP Location: Left Arm, Patient Position: Sitting, Cuff Size: Normal)   Pulse 82   Ht 5\' 9"  (1.753 m)   Wt 176 lb 9.6 oz (80.1 kg)   SpO2 99%   BMI 26.08 kg/m  , BMI Body mass index is 26.08 kg/m.     GEN: Well nourished, well developed, in no acute distress. HEENT: normal. Neck: Supple, no JVD, carotid bruits, or masses. Cardiac: RRR, 2/6 systolic murmur at the upper sternal borders.  No rubs or gallops. No clubbing, cyanosis, edema.  Radials 2+/PT 2+ and equal bilaterally.  Respiratory:  Respirations regular and unlabored, clear to auscultation bilaterally. GI: Soft, nontender, nondistended, BS + x 4. MS: no deformity or atrophy. Skin: warm and dry, no rash. Neuro:  Strength and sensation are intact. Psych: Normal affect.  Accessory Clinical Findings    Lab Results  Component Value Date   WBC 9.2 10/25/2022   HGB 10.3 (L) 10/25/2022   HCT 31.4 (L) 10/25/2022   MCV 88.5 10/25/2022   PLT 256 10/25/2022   Lab Results  Component Value Date   CREATININE 1.18 (H) 10/25/2022   BUN 23 (H) 10/25/2022   NA 138 10/25/2022   K 4.5 10/25/2022   CL 110 10/25/2022   CO2 21 (L) 10/25/2022   Lab Results  Component Value Date   ALT 19 05/01/2022   AST 18 05/01/2022   ALKPHOS 61 09/17/2021   BILITOT 0.9 05/01/2022   Lab Results  Component Value Date   CHOL 152 05/01/2022   HDL 38 (L) 05/01/2022   LDLCALC 95 05/01/2022   TRIG 99 05/01/2022   CHOLHDL 4.0 05/01/2022    Lab Results  Component Value Date   HGBA1C 8.5 (A) 10/29/2022    Assessment & Plan    1.  Coronary artery disease: Status post diagnostic  catheterization in May 2022 in the setting of LV dysfunction and chest pain with severe, diffuse multivessel CAD.  Previously seen by CT surgery w/ rec for CABG however, most recently due to insurance issues, Ms. Ludden has delayed proceeding.  Her insurance situation is currently stable, she is now working full-time and she reports today that she will plan to follow-up with CT surgery soon.  She is hopeful  to have surgery within the next few months.  Fortunately, she has done well without chest pain or dyspnea.  She is hemodynamically stable today and remains on aspirin, statin, beta-blocker.  2.  Chronic heart failure with improved ejection fraction/ischemic cardiomyopathy: Echo in March 2022 showed an EF of 35 to 40% with global hypokinesis in the setting multivessel CAD.  Repeat echo July 2023 shows improvement to 50-55% with mild mitral regurgitation.  She is euvolemic on examination and doing well at home.  She remains on beta-blocker, Entresto, spironolactone, and Jardiance.  Follow-up basic metabolic panel today.  3.  Mitral regurgitation: Mild by echo July 2023.  4.  Hyperlipidemia: LDL of 95 in May 2023, at which time Zetia was added on top of Lipitor 80.  She is not fasting today.  Will need to follow-up fasting lipids in the future.  5.  Essential hypertension: Stable on carvedilol, Entresto, and spironolactone.  6.  Type 2 diabetes mellitus: Poorly controlled with an A1c of 8.5 in November 2023.  She is followed by primary care and remains on insulin and Jardiance.  7.  Stage III chronic kidney disease: Creatinine stable at 1.18 in November.  Follow-up today in the setting of ongoing Entresto and spironolactone therapy.  8.  Disposition: Follow-up basic metabolic panel today.  Follow-up with CT surgery.  Follow-up in clinic in 3 months or sooner if necessary.   Murray Hodgkins, NP 03/03/2023, 10:49 AM

## 2023-03-03 NOTE — Telephone Encounter (Signed)
Ensure on prilosec 40 BID and if no better by end of the week will need an EGD next week , add some carafate as well QID, keep head end of bed elevated to 45 degrees

## 2023-03-03 NOTE — Patient Instructions (Signed)
Medication Instructions:  No changes *If you need a refill on your cardiac medications before your next appointment, please call your pharmacy*   Lab Work: Your provider would like for you to have following labs drawn: BMET.   Please go to the [facility] entrance and check in at the front desk.  You do not need an appointment.  They are open from 7am-6 pm.   If you have labs (blood work) drawn today and your tests are completely normal, you will receive your results only by: [place] (if you have MyChart) OR A paper copy in the mail If you have any lab test that is abnormal or we need to change your treatment, we will call you to review the results.   Testing/Procedures: None ordered   Follow-Up: At [facility], you and your health needs are our priority.  As part of our continuing mission to provide you with exceptional heart care, we have created designated Provider Care Teams.  These Care Teams include your primary Cardiologist (physician) and Advanced Practice Providers (APPs -  Physician Assistants and Nurse Practitioners) who all work together to provide you with the care you need, when you need it.  We recommend signing up for the patient portal called "MyChart".  Sign up information is provided on this After Visit Summary.  MyChart is used to connect with patients for Virtual Visits (Telemedicine).  Patients are able to view lab/test results, encounter notes, upcoming appointments, etc.  Non-urgent messages can be sent to your provider as well.   To learn more about what you can do with MyChart, go to [email or web address].    Your next appointment:   3 month(s)  Provider:   You may see Kate Sable, MD or one of the following Advanced Practice Providers on your designated Care Team:   Murray Hodgkins, NP Christell Faith, PA-C Cadence Kathlen Mody, PA-C Gerrie Nordmann, NP    Other Instructions Schedule a follow up with Dr. Cyndia Bent

## 2023-03-04 ENCOUNTER — Ambulatory Visit (INDEPENDENT_AMBULATORY_CARE_PROVIDER_SITE_OTHER): Payer: 59 | Admitting: Internal Medicine

## 2023-03-04 ENCOUNTER — Other Ambulatory Visit: Payer: Self-pay | Admitting: Internal Medicine

## 2023-03-04 ENCOUNTER — Encounter: Payer: Self-pay | Admitting: Internal Medicine

## 2023-03-04 VITALS — BP 120/80 | HR 74 | Ht 69.0 in | Wt 178.0 lb

## 2023-03-04 DIAGNOSIS — E11319 Type 2 diabetes mellitus with unspecified diabetic retinopathy without macular edema: Secondary | ICD-10-CM

## 2023-03-04 DIAGNOSIS — E1165 Type 2 diabetes mellitus with hyperglycemia: Secondary | ICD-10-CM

## 2023-03-04 DIAGNOSIS — Z794 Long term (current) use of insulin: Secondary | ICD-10-CM

## 2023-03-04 DIAGNOSIS — E1142 Type 2 diabetes mellitus with diabetic polyneuropathy: Secondary | ICD-10-CM

## 2023-03-04 LAB — POCT GLYCOSYLATED HEMOGLOBIN (HGB A1C): Hemoglobin A1C: 8.6 % — AB (ref 4.0–5.6)

## 2023-03-04 MED ORDER — INSULIN PEN NEEDLE 32G X 4 MM MISC: 1.0000 | Freq: Four times a day (QID) | 3 refills | Status: DC

## 2023-03-04 MED ORDER — NOVOLOG FLEXPEN 100 UNIT/ML ~~LOC~~ SOPN: PEN_INJECTOR | SUBCUTANEOUS | 3 refills | Status: DC

## 2023-03-04 MED ORDER — JARDIANCE 25 MG PO TABS: 25.0000 mg | ORAL_TABLET | Freq: Every day | ORAL | 2 refills | Status: DC

## 2023-03-04 MED ORDER — BASAGLAR KWIKPEN 100 UNIT/ML ~~LOC~~ SOPN: 90.0000 [IU] | PEN_INJECTOR | Freq: Every day | SUBCUTANEOUS | 6 refills | Status: DC

## 2023-03-04 MED ORDER — TRULICITY 1.5 MG/0.5ML ~~LOC~~ SOAJ: 1.5000 mg | SUBCUTANEOUS | 3 refills | Status: DC

## 2023-03-04 NOTE — Progress Notes (Signed)
Name: Sarah Duffy  MRN/ DOB: [id number], 08-Sep-1971   Age/ Sex: 52 y.o., female    PCP: Steele Sizer, MD   Reason for Endocrinology Evaluation: Type 2 Diabetes Mellitus     Date of Initial Endocrinology Visit: 08/31/2022    PATIENT IDENTIFIER: Sarah Duffy is a 52 y.o. female with a past medical history of t2DM, Dyslipidemia, CAD, CHF. The patient presented for initial endocrinology clinic visit on 08/31/2022 for consultative assistance with her diabetes management.    HPI: Sarah Duffy was    Diagnosed with DM years ago  Prior Medications tried/Intolerance: Metformin, glipizide, switch to insulin in 2017 after a huge left foot infection . Was on prandial insulin at some point         Hemoglobin A1c has ranged from 7.9% in 2023, peaking at 13.0% in 2020.  Has hx of left foot debridement  in 2017, had another infection and injury in 2021 while walking on a seas[place]   On her initial visit to our clinic Sarah Duffy had an A1c of 7.9%, Sarah Duffy was on Basaglar, Victoza, and Jardiance.  Will switch Basaglar to [employer], switched Victoza to Trulicity and continue Jardiance     SUBJECTIVE:   During the last visit (08/31/2022): A1c 7.9%  Today (03/04/23): Sarah Duffy is here for follow-up on diabetes management.  Sarah Duffy  checks her blood sugars multiple times daily. The patient has  had hypoglycemic episodes since the last clinic visit, which typically occur at night .  Since her last visit here, Sarah Duffy presented to the ED 10/2022 for chest pain Sarah Duffy was evaluated by cardiology in 01/12/2023 for CAD, pending CABG scheduling   Sarah Duffy is not on Trulicity as its not covered by insurance, nor the victoza   Denies nausea, vomiting or diarrhea  Two weeks ago Sarah Duffy had the Flu     HOME DIABETES REGIMEN: Jardiance 25 mg daily  Trulicity 3 mg once weekly Basaglar  100 units daily   Statin: yes ACE-I/ARB: yes   CONTINUOUS GLUCOSE MONITORING RECORD INTERPRETATION    Dates of Recording:  3/8-3/21/2024  Sensor description:dexcom  Results statistics:   CGM use % of time 71  Average and SD 195/68  Time in range    41    %  % Time Above 180 35  % Time above 250 22  % Time Below target 1   Glycemic patterns summary: BG's trend down overnight, and increase during the day  Hyperglycemic episodes all day  Hypoglycemic episodes occurred at night  Overnight periods: BG's trend down   DIABETIC COMPLICATIONS: Microvascular complications:  Retinopathy, neuropathy (s/p eye injections 02/2023) Denies: CKD Last eye exam: Completed 02/2022  Macrovascular complications:  CAD Denies: PVD, CVA   PAST HISTORY: Past Medical History:  Past Medical History:  Diagnosis Date   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 02/2021 Echo: EF 35-40%, glob HK, GrI DD, nl RV fxn, mildly dil LA, mod MR; b. 06/2022 Echo: EF 50-55%, no rwma, GrII DD, nl RV fxn, RVSP 71mmHg, mildly dil LA, mild MR, AoV sclerosis.   CKD (chronic kidney disease), stage III ([facility])    Coronary artery disease    a. 04/2021 Cath: LM nl, LAD 70p/m, 34m, 80d, LCX 81m, OM1 40, OM2 99 (fills via collats from OM1), RCA 60d, RPDA 75.   COVID-19 2021   Hyperlipidemia LDL goal <70    Hypertension    Ischemic cardiomyopathy    a. 02/2021 Echo: EF 35-40%, glob HK,  GrI DD; b. 06/2022 Echo: EF 50-55%.   Moderate mitral regurgitation    a. 02/2021 Echo: Mod MR.   MRSA infection within last 3 months 02/25/2016   Osteomyelitis of foot ([place]) 08/26/2016   Type II diabetes mellitus ([place])    Past Surgical History:  Past Surgical History:  Procedure Laterality Date   CHOLECYSTECTOMY  1999   COLONOSCOPY WITH PROPOFOL N/A 03/25/2021   Procedure: COLONOSCOPY WITH PROPOFOL;  Surgeon: Jonathon Bellows, MD;  Location: [facility] ENDOSCOPY;  Service: Gastroenterology;  Laterality: N/A;   COLONOSCOPY WITH PROPOFOL N/A 04/17/2021   Procedure: COLONOSCOPY WITH PROPOFOL;  Surgeon: Jonathon Bellows, MD;  [facility]  ENDOSCOPY;  Service: Gastroenterology;  Laterality: N/A;   ESOPHAGOGASTRODUODENOSCOPY (EGD) WITH PROPOFOL N/A 03/25/2021   Procedure: ESOPHAGOGASTRODUODENOSCOPY (EGD) WITH PROPOFOL;  Surgeon: Jonathon Bellows, MD;  Location: [facility] ENDOSCOPY;  Service: Gastroenterology;  Laterality: N/A;   ESOPHAGOGASTRODUODENOSCOPY (EGD) WITH PROPOFOL N/A 04/17/2021   Procedure: ESOPHAGOGASTRODUODENOSCOPY (EGD) WITH PROPOFOL;  Surgeon: Jonathon Bellows, MD;  Location: [facility] ENDOSCOPY;  Service: Gastroenterology;  Laterality: N/A;   RIGHT/LEFT HEART CATH AND CORONARY ANGIOGRAPHY Bilateral 04/14/2021   Procedure: RIGHT/LEFT HEART CATH AND CORONARY ANGIOGRAPHY;  Surgeon: Nelva Bush, MD;  Location: [place] [facility] LAB;  Service: Cardiovascular;  Laterality: Bilateral;   TOE SURGERY Left 02/07/2016   Pinky Toe    Social History:  reports that Sarah Duffy has never smoked. Sarah Duffy has never used smokeless tobacco. Sarah Duffy reports that Sarah Duffy does not drink alcohol and does not use drugs. Family History:  Family History  Problem Relation Age of Onset   Diabetes Mother    Ulcers Mother    Heart disease Father    AAA (abdominal aortic aneurysm) Father    Diabetes Father    Hypertension Father    Stroke Father    Alzheimer's disease Father    Heart attack Sister    Seizures Brother    Diabetes Maternal Grandmother    Breast cancer Maternal Grandmother      HOME MEDICATIONS: Allergies as of 03/04/2023   No Known Allergies      Medication List        Accurate as of March 04, 2023  9:03 AM. If you have any questions, ask your nurse or doctor.          STOP taking these medications    albuterol 108 (90 Base) MCG/ACT inhaler Commonly known as: VENTOLIN HFA Stopped by: Dorita Sciara, MD   aspirin EC 81 MG tablet Stopped by: Dorita Sciara, MD   benzonatate 100 MG capsule Commonly known as: TESSALON Stopped by: Dorita Sciara, MD   blood glucose meter kit and supplies Stopped by: Dorita Sciara,  MD   Dexcom G7 Sensor Misc Stopped by: Dorita Sciara, MD   ezetimibe 10 MG tablet Commonly known as: Zetia Stopped by: Dorita Sciara, MD   fluticasone 50 MCG/ACT nasal spray Commonly known as: FLONASE Stopped by: Dorita Sciara, MD   methylPREDNISolone 4 MG Tbpk tablet Commonly known as: MEDROL DOSEPAK Stopped by: Dorita Sciara, MD   montelukast 10 MG tablet Commonly known as: SINGULAIR Stopped by: Dorita Sciara, MD   multivitamin with minerals tablet Stopped by: Dorita Sciara, MD   ondansetron 4 MG disintegrating tablet Commonly known as: ZOFRAN-ODT Stopped by: Dorita Sciara, MD   Trulicity 3 0000000 Sopn Generic drug: Dulaglutide Stopped by: Dorita Sciara, MD       TAKE these medications    atorvastatin 80 MG tablet  Commonly known as: LIPITOR Take 1 tablet (80 mg total) by mouth daily.   Basaglar KwikPen 100 UNIT/ML Inject 100 Units into the skin daily.   carvedilol 12.5 MG tablet Commonly known as: COREG Take 1 tablet (12.5 mg total) by mouth 2 (two) times daily.   Entresto 24-26 MG Generic drug: sacubitril-valsartan Take 1 tablet by mouth 2 (two) times daily.   Jardiance 25 MG Tabs tablet Generic drug: empagliflozin Take 1 tablet (25 mg total) by mouth daily.   nitroGLYCERIN 0.4 MG SL tablet Commonly known as: NITROSTAT PLACE 1 TABLET UNDER THE TONGUE EVERY 5 MINUTES AS NEEDED FOR CHEST PAIN.   omeprazole 40 MG capsule Commonly known as: PRILOSEC Take 1 capsule (40 mg total) by mouth 2 (two) times daily before a meal.   spironolactone 25 MG tablet Commonly known as: ALDACTONE Take 1 tablet (25 mg total) by mouth daily.   sucralfate 1 GM/10ML suspension Commonly known as: CARAFATE Take 10 mLs (1 g total) by mouth 4 (four) times daily.         ALLERGIES: No Known Allergies   REVIEW OF SYSTEMS: A comprehensive ROS was conducted with the patient and is negative except as per  HPI    OBJECTIVE:   VITAL SIGNS: BP 120/80 (BP Location: Left Arm, Patient Position: Sitting, Cuff Size: Small)   Pulse 74   Ht 5\' 9"  (1.753 m)   Wt 178 lb (80.7 kg)   SpO2 97%   BMI 26.29 kg/m    PHYSICAL EXAM:  General: Pt appears well and is in NAD  Neck: General: Supple without adenopathy or carotid bruits. Thyroid: Thyroid size normal.  No goiter or nodules appreciated.   Lungs: Clear with good BS bilat with no rales, rhonchi, or wheezes  Heart: RRR   Abdomen:  soft, nontender  Extremities:  Lower extremities - No pretibial edema.   Neuro: MS is good with appropriate affect, pt is alert and Ox3    DM foot exam: 08/31/2022  The skin of the feet is intact without sores or ulcerations. The pedal pulses are 2+ on right and 2+ on left. The sensation is absent to a screening 5.07, 10 gram monofilament bilaterally   DATA REVIEWED:  Lab Results  Component Value Date   HGBA1C 8.6 (A) 03/04/2023   HGBA1C 8.5 (A) 10/29/2022   HGBA1C 7.9 (A) 08/06/2022   Lab Results  Component Value Date   MICROALBUR 11.1 05/01/2022   LDLCALC 95 05/01/2022   CREATININE 1.10 (H) 03/03/2023   Lab Results  Component Value Date   MICRALBCREAT 258 (H) 05/01/2022    Lab Results  Component Value Date   CHOL 152 05/01/2022   HDL 38 (L) 05/01/2022   LDLCALC 95 05/01/2022   TRIG 99 05/01/2022   CHOLHDL 4.0 05/01/2022        ASSESSMENT / PLAN / RECOMMENDATIONS:   1) Type 2 Diabetes Mellitus, Poorly  controlled, With retinopathic, neuropathic and macrovascular   complications - Most recent A1c of 8.6 %. Goal A1c < 7.0 %.    Patient has been noted with worsening glycemic control Since her last visit here Sarah Duffy had a change in insurance and has not been able to get the Victoza nor the Trulicity Sarah Duffy will be restarted on a smaller dose of Trulicity than previously intended, as Sarah Duffy has not been on any GLP-1 agonist for a while I have noted on her CGM download that Sarah Duffy has hypoglycemia  overnight, will decrease basal insulin as below, Sarah Duffy has  also been noted with hypoglycemia during the day, with BGs consistently >200 mg/dL , I have recommended starting her on prandial insulin based on a correction scale, we discussed the importance of optimizing BG's prior to her CABG, we discussed the importance of optimizing BG's postoperatively to prevent infection and promote healing Sarah Duffy used to be on metformin without any intolerance reported, we will consider metformin in the future after her heart surgery  MEDICATIONS: Decrease Basaglar 9 units daily Start Trulicity 1.5 mg once weekly Continue Jardiance 25 mg daily Start NovoLog Per correction factor : BG-120/30)  EDUCATION / INSTRUCTIONS: BG monitoring instructions: Patient is instructed to check her blood sugars 3 times a day, before each meal Call [place] [facility] if: BG persistently < 70  I reviewed the Rule of 15 for the treatment of hypoglycemia in detail with the patient. Literature supplied.   2) Diabetic complications:  Eye: Does  have known diabetic retinopathy.  Neuro/ Feet: Does  have known diabetic peripheral neuropathy. Renal: Patient does not have known baseline CKD. Sarah Duffy is  on an ACEI/ARB at present.       F/U in 4 months    Signed electronically by: Mack Guise, MD  [facility]  [facility] [address] [place],  [address] Phone: [phone] FAX: [phone]   CC: Steele Sizer, [address] [place] [address] Phone: [phone]  Fax: [phone]    Return to Endocrinology clinic as below: Future Appointments  Date Time Provider [place]  03/04/2023  9:10 AM Zania Kalisz, Melanie Crazier, MD [facility] None  03/09/2023 11:00 AM Felipa Furnace, DPM [facility]  06/03/2023  8:00 AM Theora Gianotti, NP [facility] None

## 2023-03-04 NOTE — Patient Instructions (Addendum)
Decrease Basaglar 90 units daily   Start Trulicity 1.5 mg ONCE weekly  Continue Jardiance 25 mg daily  Novolog correctional insulin:  Use the scale below to help guide you three times a day ( breakfast, lunch and supper)   Blood sugar before meal Number of units to inject  Less than 150 0 unit  151 -  180 1 units  181 -  210 2 units  211 -  240 3 units  241 -  270 4 units  271 -  300 5 units  301 -  330 6 units  331 -  360 7 units  361 -  390 8 units       HOW TO TREAT LOW BLOOD SUGARS (Blood sugar LESS THAN 70 MG/DL) Please follow the RULE OF 15 for the treatment of hypoglycemia treatment (when your (blood sugars are less than 70 mg/dL)   STEP 1: Take 15 grams of carbohydrates when your blood sugar is low, which includes:  3-4 GLUCOSE TABS  OR 3-4 OZ OF JUICE OR REGULAR SODA OR ONE TUBE OF GLUCOSE GEL    STEP 2: RECHECK blood sugar in 15 MINUTES STEP 3: If your blood sugar is still low at the 15 minute recheck --> then, go back to STEP 1 and treat AGAIN with another 15 grams of carbohydrates.

## 2023-03-08 NOTE — Progress Notes (Unsigned)
Name: Sarah Duffy   MRN: [id number]    DOB: 12/24/1970   Date:03/09/2023       Progress Note  Subjective  Chief Complaint  Medication Refill  HPI  DMII:recent seen by Endocrinologist , A1C done 03/04/2023 was 8.6 % she was on Victoza and insurance stopped covering and is currently waiting to get Trulicity 1.5 mg filled, she has been out of GLP-1 agonists for the past 6 weeks. Still taking Semglee down to 90 units due to hypoglycemia at nighttime, she recently had novolog resumed and sent to pharmacy , she still takes [place]. She has Dexcom 7 - her glucose range has been 34% in range only 2% low or very low. She has CAD, dyslipidemia and microalbuminuria  Rash on both ankles: on ankles ( below socks ), we will try treating for contact dermatitis   MDD:  She lost her mother unexpectedly on Feb 4 th , 20, still waiting for open heart surgery, she states had to miss work due to acute illness and is frustrated.  She does not want to resume medications at this time . She is in remission, phq 9 is negative   HTN: bp is at goal today, she is taking medication as prescribed  Dyspepsia: she missed work due to viral illness followed by dyspepsia, she has been out from March 18 th until today, still has some dysphagia, taking omeprazole and  carafate and is waiting to see GI . She also has dysphagia.   Asthma mild intermittent/AR: doing well only using prn albuterol   CAD/Chronic systolic heart failure/Cardiomyopathy: under the care of Dr. Mylo Red, she went for second opinion at [facility] and also recommended CABG , she is now planning on having surgery done by Dr. Cyndia Bent and waiting for insurance . She was ready to have CABG on 09/19/2021 but it was cancelled by the physicians due to personal problems followed by insurance problems, but now she has better coverage and will try again. . She denies any recent chest pain, taking medication daily . She has ischemic cardiomyopathy but currently no symptoms.  Denies orthopnea, PND or lower extremity edema she has some SOB with activity.   Conclusions Cath :   Multivessel coronary artery disease including mild to moderate diffuse plaquing of the LAD and RCA.  The mid/distal LAD demonstrates 3 focal lesions of up to 80% that are hemodynamically significant by iFR (iFR = 0.71).  There is also a 99% stenosis of OM2 with faint left to left collaterals supplying the distal branch as well as sequential 60% distal RCA and 70-80% proximal RPDA lesions.   IMPRESSIONS  Echo done 06/23/2022 showed improvement   Left ventricular ejection fraction, by estimation, is 50 to 55%. Left ventricular ejection fraction by 3D volume is 52 %. Left ventricular ejection fraction by PLAX is 53 %. The left ventricle has low normal function. The left ventricle has no regional wall motion abnormalities. Left ventricular diastolic parameters are consistent with Grade II diastolic dysfunction (pseudonormalization). The average left ventricular global longitudinal strain is -18.9 %. 1. Right ventricular systolic function is normal. The right ventricular size is normal. There is normal pulmonary artery systolic pressure. The estimated right ventricular systolic pressure is [id number] mmHg. 2. 3. Left atrial size was mildly dilated. The mitral valve is normal in structure. Mild mitral valve regurgitation. No evidence of mitral stenosis. 4. The aortic valve is normal in structure. Aortic valve regurgitation is not visualized. Aortic valve sclerosis is present, with no evidence of aortic  valve stenosis. 5. The inferior vena cava is normal in size with greater than 50% respiratory variability, suggesting right atrial pressure of 3 mmHg. 6. Comparison(s): Previous GLS reported as -13.5 Previous LVEF reported as 35-40%.     Patient Active Problem List   Diagnosis Date Noted   Asthma, well controlled 03/09/2023   Depression, major, in remission ([place]) 03/09/2023   Perennial  allergic rhinitis with seasonal variation 03/09/2023   Type 2 diabetes mellitus with diabetic polyneuropathy, with long-term current use of insulin ([place]) 08/31/2022   Type 2 diabetes mellitus with retinopathy, with long-term current use of insulin ([place]) 08/31/2022   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([place]) 08/31/2022   Hypertension 06/11/2022   Ischemic cardiomyopathy 06/11/2022   Coronary artery disease 0000000   Chronic systolic heart failure ([place]) 06/11/2022   Angina pectoris associated with type 2 diabetes mellitus ([place]) 01/05/2022   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 edema associated with type 2 diabetes mellitus ([place]) 05/01/2021   Cardiomyopathy ([place]) 04/14/2021   Depression, major, recurrent, mild ([place]) 09/25/2019   History of 2019 novel coronavirus disease (COVID-19) 06/23/2019   Migraine headache without aura 01/14/2018   Type II diabetes mellitus ([place]) 05/22/2017   Diabetic retinopathy of left eye, with macular edema, with severe nonproliferative retinopathy, associated with type 2 diabetes mellitus 05/21/2016   Severe nonproliferative diabetic retinopathy of right eye without macular edema associated with type 2 diabetes mellitus ([place]) 05/21/2016   Hyperlipidemia LDL goal <70 03/10/2016   MI (mitral incompetence) 02/25/2016   Insomnia 02/25/2016   Anxiety 09/04/2014    Past Surgical History:  Procedure Laterality Date   CHOLECYSTECTOMY  1999   COLONOSCOPY WITH PROPOFOL N/A 03/25/2021   Procedure: COLONOSCOPY WITH PROPOFOL;  Surgeon: Jonathon Bellows, MD;  Location: [facility] ENDOSCOPY;  Service: Gastroenterology;  Laterality: N/A;   COLONOSCOPY WITH PROPOFOL N/A 04/17/2021   Procedure: COLONOSCOPY WITH PROPOFOL;  Surgeon: Jonathon Bellows, MD;  Location: [facility] ENDOSCOPY;  Service: Gastroenterology;  Laterality: N/A;   ESOPHAGOGASTRODUODENOSCOPY (EGD) WITH PROPOFOL N/A 03/25/2021   Procedure: ESOPHAGOGASTRODUODENOSCOPY (EGD) WITH PROPOFOL;  Surgeon: Jonathon Bellows,  MD;  Location: [facility] ENDOSCOPY;  Service: Gastroenterology;  Laterality: N/A;   ESOPHAGOGASTRODUODENOSCOPY (EGD) WITH PROPOFOL N/A 04/17/2021   Procedure: ESOPHAGOGASTRODUODENOSCOPY (EGD) WITH PROPOFOL;  Surgeon: Jonathon Bellows, MD;  Location: [facility] ENDOSCOPY;  Service: Gastroenterology;  Laterality: N/A;   RIGHT/LEFT HEART CATH AND CORONARY ANGIOGRAPHY Bilateral 04/14/2021   Procedure: RIGHT/LEFT HEART CATH AND CORONARY ANGIOGRAPHY;  Surgeon: Nelva Bush, MD;  Location: [facility];  Service: Cardiovascular;  Laterality: Bilateral;   TOE SURGERY Left 02/07/2016   Pinky Toe    Family History  Problem Relation Age of Onset   Diabetes Mother    Ulcers Mother    Heart disease Father    AAA (abdominal aortic aneurysm) Father    Diabetes Father    Hypertension Father    Stroke Father    Alzheimer's disease Father    Heart attack Sister    Seizures Brother    Diabetes Maternal Grandmother    Breast cancer Maternal Grandmother     Social History   Tobacco Use   Smoking status: Never   Smokeless tobacco: Never  Substance Use Topics   Alcohol use: No    Alcohol/week: 0.0 standard drinks of alcohol     Current Outpatient Medications:    atorvastatin (LIPITOR) 80 MG tablet, Take 1 tablet (80 mg total) by mouth daily., Disp: 90 tablet, Rfl: 1   carvedilol (COREG) 12.5 MG tablet, Take  1 tablet (12.5 mg total) by mouth 2 (two) times daily., Disp: 180 tablet, Rfl: 3   Dulaglutide (TRULICITY) 1.5 0000000 SOPN, Inject 1.5 mg into the skin once a week., Disp: 6 mL, Rfl: 3   ENTRESTO 24-26 MG, Take 1 tablet by mouth 2 (two) times daily., Disp: , Rfl:    insulin aspart (NOVOLOG FLEXPEN) 100 UNIT/ML FlexPen, Max daily 30 units, Disp: 30 mL, Rfl: 3   insulin glargine-yfgn (SEMGLEE) 100 UNIT/ML Pen, INJECT 90 UNITS INTO THE SKIN DAILY, Disp: 30 mL, Rfl: 6   Insulin Pen Needle 32G X 4 MM MISC, 1 Device by Does not apply route in the morning, at noon, in the evening, and at bedtime., Disp:  400 each, Rfl: 3   JARDIANCE 25 MG TABS tablet, Take 1 tablet (25 mg total) by mouth daily., Disp: 90 tablet, Rfl: 2   nitroGLYCERIN (NITROSTAT) 0.4 MG SL tablet, PLACE 1 TABLET UNDER THE TONGUE EVERY 5 MINUTES AS NEEDED FOR CHEST PAIN., Disp: 25 tablet, Rfl: 2   omeprazole (PRILOSEC) 40 MG capsule, Take 1 capsule (40 mg total) by mouth 2 (two) times daily before a meal., Disp: 60 capsule, Rfl: 0   spironolactone (ALDACTONE) 25 MG tablet, Take 1 tablet (25 mg total) by mouth daily., Disp: 90 tablet, Rfl: 3   sucralfate (CARAFATE) 1 GM/10ML suspension, Take 10 mLs (1 g total) by mouth 4 (four) times daily., Disp: 420 mL, Rfl: 1  No Known Allergies  I personally reviewed active problem list, medication list, allergies, family history, social history, health maintenance with the patient/caregiver today.   ROS  Ten systems reviewed and is negative except as mentioned in HPI   Objective  Vitals:   03/09/23 1313  BP: 114/72  Pulse: 88  Resp: 16  Temp: 98 F (36.7 C)  TempSrc: Oral  SpO2: 97%  Weight: 176 lb 9.6 oz (80.1 kg)  Height: 5\' 9"  (1.753 m)    Body mass index is 26.08 kg/m.  Physical Exam  Constitutional: Patient appears well-developed and well-nourished.No distress.  HEENT: head atraumatic, normocephalic, pupils equal and reactive to light, , neck supple Cardiovascular: Normal rate, regular rhythm and normal heart sounds.  No murmur heard. No BLE edema. Pulmonary/Chest: Effort normal and breath sounds normal. No respiratory distress. Abdominal: Soft.  There is no tenderness. Psychiatric: Patient has a normal mood and affect. behavior is normal. Judgment and thought content normal.   Recent Results (from the past 2160 hour(s))  HM DIABETES EYE EXAM     Status: Abnormal   Collection Time: 01/12/23 12:00 AM  Result Value Ref Range   HM Diabetic Eye Exam Retinopathy (A) No Retinopathy    Comment: [facility]  Novel Coronavirus, NAA ([facility])     Status: None    Collection Time: 02/26/23 12:00 AM   Specimen: Nasopharyngeal(NP) swabs in vial transport medium   Nasopharynge  Previous  Result Value Ref Range   SARS-CoV-2, NAA Not Detected Not Detected    Comment: This nucleic acid amplification test was developed and its performance characteristics determined by [employer]. Nucleic acid amplification tests include RT-PCR and TMA. This test has not been FDA cleared or approved. This test has been authorized by FDA under an Emergency Use Authorization (EUA). This test is only authorized for the duration of time the declaration that circumstances exist justifying the authorization of the emergency use of in vitro diagnostic tests for detection of SARS-CoV-2 virus and/or diagnosis of COVID-19 infection under section 564(b)(1) of the Act,  21 U.S.C. 360bbb-3(b) (1), unless the authorization is terminated or revoked sooner. When diagnostic testing is negative, the possibility of a false negative result should be considered in the context of a patient's recent exposures and the presence of clinical signs and symptoms consistent with COVID-19. An individual without symptoms of COVID-19 and who is not shedding SARS-CoV-2 virus wo uld expect to have a negative (not detected) result in this assay.   POCT Influenza A/B     Status: None   Collection Time: 02/26/23  2:57 PM  Result Value Ref Range   Influenza A, POC Negative Negative   Influenza B, POC Negative Negative  Basic metabolic panel     Status: Abnormal   Collection Time: 03/03/23 11:05 AM  Result Value Ref Range   Sodium 136 135 - 145 mmol/L   Potassium 4.3 3.5 - 5.1 mmol/L   Chloride 109 98 - 111 mmol/L   CO2 21 (L) 22 - 32 mmol/L   Glucose, Bld 237 (H) 70 - 99 mg/dL    Comment: Glucose reference range applies only to samples taken after fasting for at least 8 hours.   BUN 25 (H) 6 - 20 mg/dL   Creatinine, Ser 1.10 (H) 0.44 - 1.00 mg/dL   Calcium 8.7 (L) 8.9 - 10.3 mg/dL   GFR,  Estimated >60 >60 mL/min    Comment: (NOTE) Calculated using the CKD-EPI Creatinine Equation (2021)    Anion gap 6 5 - 15    Comment: Performed at [facility], [address] [place], [place] [address]  POCT glycosylated hemoglobin (Hb A1C)     Status: Abnormal   Collection Time: 03/04/23  9:00 AM  Result Value Ref Range   Hemoglobin A1C 8.6 (A) 4.0 - 5.6 %   HbA1c POC (<> result, manual entry)     HbA1c, POC (prediabetic range)     HbA1c, POC (controlled diabetic range)      PHQ2/9:    03/09/2023    1:12 PM 02/26/2023    2:57 PM 11/30/2022    1:36 PM 10/29/2022    9:58 AM 08/06/2022    9:39 AM  Depression screen PHQ 2/9  Decreased Interest 0 0 0 0 0  Down, Depressed, Hopeless 0 0 0 0 0  PHQ - 2 Score 0 0 0 0 0  Altered sleeping 0  0 0 0  Tired, decreased energy 0  0 0 0  Change in appetite 0  0 0 0  Feeling bad or failure about yourself  0  0 0 0  Trouble concentrating 0  0 0 0  Moving slowly or fidgety/restless 0  0 0 0  Suicidal thoughts 0  0 0 0  PHQ-9 Score 0  0 0 0  Difficult doing work/chores   Not difficult at all      phq 9 is negative   Fall Risk:    03/09/2023    1:12 PM 02/26/2023    2:57 PM 11/30/2022    1:36 PM 10/29/2022    9:47 AM 08/06/2022    9:39 AM  Fall Risk   Falls in the past year? 0 0 0 0 0  Number falls in past yr:  0 0  0  Injury with Fall?  0 0  0  Risk for fall due to : No Fall Risks   No Fall Risks No Fall Risks  Follow up Falls prevention discussed;Education provided;Falls evaluation completed   Falls prevention discussed;Education provided;Falls evaluation completed Falls prevention discussed  Functional Status Survey: Is the patient deaf or have difficulty hearing?: No Does the patient have difficulty seeing, even when wearing glasses/contacts?: No Does the patient have difficulty concentrating, remembering, or making decisions?: No Does the patient have difficulty walking or climbing stairs?: No Does the patient  have difficulty dressing or bathing?: No Does the patient have difficulty doing errands alone such as visiting a doctor's office or shopping?: No    Assessment & Plan  1. Chronic systolic heart failure ([place])  On medication problems   2. 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(HCC)  - Microalbumin / creatinine urine ratio - COMPLETE METABOLIC PANEL WITH GFR  3. Coronary artery disease due to lipid rich plaque  - Lipid panel  4. Angina pectoris associated with type 2 diabetes mellitus ([place])  Waiting to have CABG   5. Depression, major, in remission ([place])  Unchanged   6. Asthma, mild intermittent, well-controlled  On albuterol prn   7. Perennial allergic rhinitis with seasonal variation   8. Dyspepsia  Keep follow up with GI   9. Contact dermatitis and eczema  - triamcinolone cream (KENALOG) 0.1 %; Apply 1 Application topically 2 (two) times daily.  Dispense: 453.6 g; Refill: 0

## 2023-03-09 ENCOUNTER — Telehealth: Payer: Self-pay | Admitting: Nurse Practitioner

## 2023-03-09 ENCOUNTER — Telehealth: Payer: Self-pay

## 2023-03-09 ENCOUNTER — Ambulatory Visit: Payer: Medicaid Other | Admitting: Podiatry

## 2023-03-09 ENCOUNTER — Ambulatory Visit (INDEPENDENT_AMBULATORY_CARE_PROVIDER_SITE_OTHER): Payer: 59 | Admitting: Family Medicine

## 2023-03-09 ENCOUNTER — Encounter: Payer: Self-pay | Admitting: Podiatry

## 2023-03-09 ENCOUNTER — Encounter: Payer: Self-pay | Admitting: Family Medicine

## 2023-03-09 ENCOUNTER — Ambulatory Visit (INDEPENDENT_AMBULATORY_CARE_PROVIDER_SITE_OTHER): Payer: Medicaid Other

## 2023-03-09 VITALS — BP 114/72 | HR 88 | Temp 98.0°F | Resp 16 | Ht 69.0 in | Wt 176.6 lb

## 2023-03-09 DIAGNOSIS — R809 Proteinuria, unspecified: Secondary | ICD-10-CM | POA: Diagnosis not present

## 2023-03-09 DIAGNOSIS — M2012 Hallux valgus (acquired), left foot: Secondary | ICD-10-CM | POA: Diagnosis not present

## 2023-03-09 DIAGNOSIS — I209 Angina pectoris, unspecified: Secondary | ICD-10-CM

## 2023-03-09 DIAGNOSIS — I5022 Chronic systolic (congestive) heart failure: Secondary | ICD-10-CM | POA: Diagnosis not present

## 2023-03-09 DIAGNOSIS — E1129 Type 2 diabetes mellitus with other diabetic kidney complication: Secondary | ICD-10-CM

## 2023-03-09 DIAGNOSIS — I2583 Coronary atherosclerosis due to lipid rich plaque: Secondary | ICD-10-CM | POA: Diagnosis not present

## 2023-03-09 DIAGNOSIS — I2510 Atherosclerotic heart disease of native coronary artery without angina pectoris: Secondary | ICD-10-CM | POA: Diagnosis not present

## 2023-03-09 DIAGNOSIS — R1013 Epigastric pain: Secondary | ICD-10-CM | POA: Diagnosis not present

## 2023-03-09 DIAGNOSIS — J3089 Other allergic rhinitis: Secondary | ICD-10-CM

## 2023-03-09 DIAGNOSIS — F325 Major depressive disorder, single episode, in full remission: Secondary | ICD-10-CM | POA: Insufficient documentation

## 2023-03-09 DIAGNOSIS — M7752 Other enthesopathy of left foot: Secondary | ICD-10-CM

## 2023-03-09 DIAGNOSIS — E1159 Type 2 diabetes mellitus with other circulatory complications: Secondary | ICD-10-CM

## 2023-03-09 DIAGNOSIS — J4520 Mild intermittent asthma, uncomplicated: Secondary | ICD-10-CM | POA: Diagnosis not present

## 2023-03-09 DIAGNOSIS — J45909 Unspecified asthma, uncomplicated: Secondary | ICD-10-CM | POA: Insufficient documentation

## 2023-03-09 DIAGNOSIS — L259 Unspecified contact dermatitis, unspecified cause: Secondary | ICD-10-CM

## 2023-03-09 DIAGNOSIS — J302 Other seasonal allergic rhinitis: Secondary | ICD-10-CM | POA: Diagnosis not present

## 2023-03-09 MED ORDER — TRIAMCINOLONE ACETONIDE 0.1 % EX CREA: 1.0000 | TOPICAL_CREAM | Freq: Two times a day (BID) | CUTANEOUS | 0 refills | Status: DC

## 2023-03-09 NOTE — Progress Notes (Signed)
Subjective:  Patient ID: Sarah Duffy, female    DOB: Mar 23, 1971,  MRN: [id number]  Chief Complaint  Patient presents with   Foot Pain    "My big toe is starting to go under the toe next to it.  My ankle is also bothering me." N - big toe moving L - left D - Nov 2023 O - gradually worsened C - moved underneath the toe next to it A - none T - surgery in 2022    52 y.o. female presents with the above complaint.  Patient presents with left lateral ankle pain.  Patient states pain for touch is progressive gotten worse worse with ambulation worse with pressure she states it came out of nowhere started bothering her November 2023.  She wanted discuss treatment options for it she is a diabetic with last A1c of 8%.   Review of Systems: Negative except as noted in the HPI. Denies N/V/F/Ch.  Past Medical History:  Diagnosis Date   Chronic HFimpEF (heart failure with improved ejection fraction) ([place])    a. 02/2021 Echo: EF 35-40%, glob HK, GrI DD, nl RV fxn, mildly dil LA, mod MR; b. 06/2022 Echo: EF 50-55%, no rwma, GrII DD, nl RV fxn, RVSP 20mmHg, mildly dil LA, mild MR, AoV sclerosis.   CKD (chronic kidney disease), stage III (HCC)    Coronary artery disease    a. 04/2021 Cath: LM nl, LAD 70p/m, 8m, 80d, LCX 72m, OM1 40, OM2 99 (fills via collats from OM1), RCA 60d, RPDA 75.   COVID-19 2021   Hyperlipidemia LDL goal <70    Hypertension    Ischemic cardiomyopathy    a. 02/2021 Echo: EF 35-40%, glob HK, GrI DD; b. 06/2022 Echo: EF 50-55%.   Moderate mitral regurgitation    a. 02/2021 Echo: Mod MR.   MRSA infection within last 3 months 02/25/2016   Osteomyelitis of foot ([place]) 08/26/2016   Type II diabetes mellitus ([place])     Current Outpatient Medications:    atorvastatin (LIPITOR) 80 MG tablet, Take 1 tablet (80 mg total) by mouth daily., Disp: 90 tablet, Rfl: 1   carvedilol (COREG) 12.5 MG tablet, Take 1 tablet (12.5 mg total) by mouth 2 (two) times daily., Disp: 180 tablet, Rfl: 3    Dulaglutide (TRULICITY) 1.5 0000000 SOPN, Inject 1.5 mg into the skin once a week., Disp: 6 mL, Rfl: 3   ENTRESTO 24-26 MG, Take 1 tablet by mouth 2 (two) times daily., Disp: , Rfl:    insulin aspart (NOVOLOG FLEXPEN) 100 UNIT/ML FlexPen, Max daily 30 units, Disp: 30 mL, Rfl: 3   insulin glargine-yfgn (SEMGLEE) 100 UNIT/ML Pen, INJECT 90 UNITS INTO THE SKIN DAILY, Disp: 30 mL, Rfl: 6   Insulin Pen Needle 32G X 4 MM MISC, 1 Device by Does not apply route in the morning, at noon, in the evening, and at bedtime., Disp: 400 each, Rfl: 3   JARDIANCE 25 MG TABS tablet, Take 1 tablet (25 mg total) by mouth daily., Disp: 90 tablet, Rfl: 2   nitroGLYCERIN (NITROSTAT) 0.4 MG SL tablet, PLACE 1 TABLET UNDER THE TONGUE EVERY 5 MINUTES AS NEEDED FOR CHEST PAIN., Disp: 25 tablet, Rfl: 2   omeprazole (PRILOSEC) 40 MG capsule, Take 1 capsule (40 mg total) by mouth 2 (two) times daily before a meal., Disp: 60 capsule, Rfl: 0   spironolactone (ALDACTONE) 25 MG tablet, Take 1 tablet (25 mg total) by mouth daily., Disp: 90 tablet, Rfl: 3   sucralfate (CARAFATE) 1 GM/10ML suspension,  Take 10 mLs (1 g total) by mouth 4 (four) times daily., Disp: 420 mL, Rfl: 1  Social History   Tobacco Use  Smoking Status Never  Smokeless Tobacco Never    No Known Allergies Objective:  There were no vitals filed for this visit. There is no height or weight on file to calculate BMI. Constitutional Well developed. Well nourished.  Vascular Dorsalis pedis pulses palpable bilaterally. Posterior tibial pulses palpable bilaterally. Capillary refill normal to all digits.  No cyanosis or clubbing noted. Pedal hair growth normal.  Neurologic Normal speech. Oriented to person, place, and time. Epicritic sensation to light touch grossly present bilaterally.  Dermatologic Nails well groomed and normal in appearance. No open wounds. No skin lesions.  Orthopedic: Pain on palpation to the left peroneal tendon along the course of the  tendon no pain at the insertion.  No pain at the Achilles tendon posterior tibial tendon and ATFL ligament   Radiographs: 3 views of schedule mature adult left foot arthritis noted to the midfoot moderate bunion deformity noted.  No OCD lesion noted no fractures noted. Assessment:   1. Hallux abducto valgus, left   2. Tendonitis of ankle, left    Plan:  Patient was evaluated and treated and all questions answered.  Left peroneal tendinitis -All questions and concerns were discussed with the patient in extensive detail given the amount of pain that she is having she will benefit from cam boot immobilization patient agrees with plan would like to proceed with cam boot immobilization -Cam boot was dispensed.  No follow-ups on file.

## 2023-03-09 NOTE — Telephone Encounter (Signed)
   Pre-operative Risk Assessment    Patient Name: Sarah Duffy  DOB: 04/18/1971 MRN: [id number]      Request for Surgical Clearance    Procedure:  Esophagogastroduodenoscopy  Date of Surgery:  Clearance TBD                                 Surgeon:  Not listed  Surgeon's Group or Practice Name:  [facility] Phone number:  [phone] Fax number:  [phone]   Type of Clearance Requested:   - Medical    Type of Anesthesia:  General    Additional requests/questions:    Signed, Maxwell Caul   03/09/2023, 2:26 PM

## 2023-03-09 NOTE — Telephone Encounter (Signed)
Patient's PCP Dr. Ancil Boozer secure messaged to request to have patient scheduled for her EGD.  Per Dr. Ancil Boozer, "taking sucralfate and PPI but is still having dysphagia and dyspepsia".   Per 03/03/23 advice provided to patient "If she is not better by Friday she will call us and let us know and we will schedule an EGD".  Reviewing patients chart she does have cardiac history so I have sent cardiac clearance from Dr. Murray Hodgkins since she saw him recently on 03/03/23.  I will also have Dr. Vicente Males to advise again as patient has not been seen in the office since 06/12/21.  Dr. Vicente Males please re-advise on moving forward with scheduling patient for EGD.  Thanks, [place], [place]

## 2023-03-09 NOTE — Telephone Encounter (Signed)
If having dysphagia ok to proceed with EGD with cardiac clearance

## 2023-03-09 NOTE — Telephone Encounter (Signed)
Sarah Duffy, you recently saw this patient in the office. She is pending clearance for EGD. Could you please comment on medical clearance and send your response to p cv div preop?  Thanks, Sharyn Lull

## 2023-03-10 ENCOUNTER — Telehealth: Payer: Self-pay

## 2023-03-10 LAB — COMPLETE METABOLIC PANEL WITH GFR
AG Ratio: 1.7 (calc) (ref 1.0–2.5)
ALT: 18 U/L (ref 6–29)
AST: 15 U/L (ref 10–35)
Albumin: 4.3 g/dL (ref 3.6–5.1)
Alkaline phosphatase (APISO): 77 U/L (ref 37–153)
BUN: 18 mg/dL (ref 7–25)
CO2: 24 mmol/L (ref 20–32)
Calcium: 9.1 mg/dL (ref 8.6–10.4)
Chloride: 108 mmol/L (ref 98–110)
Creat: 0.93 mg/dL (ref 0.50–1.03)
Globulin: 2.6 g/dL (calc) (ref 1.9–3.7)
Glucose, Bld: 124 mg/dL — ABNORMAL HIGH (ref 65–99)
Potassium: 4.1 mmol/L (ref 3.5–5.3)
Sodium: 140 mmol/L (ref 135–146)
Total Bilirubin: 1.2 mg/dL (ref 0.2–1.2)
Total Protein: 6.9 g/dL (ref 6.1–8.1)
eGFR: 73 mL/min/{1.73_m2} (ref >=60)

## 2023-03-10 LAB — MICROALBUMIN / CREATININE URINE RATIO
Creatinine, Urine: 72 mg/dL (ref 20–275)
Microalb Creat Ratio: 67 mg/g creat — ABNORMAL HIGH (ref <30)
Microalb, Ur: 4.8 mg/dL

## 2023-03-10 LAB — LIPID PANEL
Cholesterol: 138 mg/dL (ref <200)
HDL: 45 mg/dL — ABNORMAL LOW (ref >=50)
LDL Cholesterol (Calc): 79 mg/dL (calc)
Non-HDL Cholesterol (Calc): 93 mg/dL (calc) (ref <130)
Total CHOL/HDL Ratio: 3.1 (calc) (ref <5.0)
Triglycerides: 64 mg/dL (ref <150)

## 2023-03-10 NOTE — Telephone Encounter (Unsigned)
Copied from [place] [phone] [id number]. Topic: General - Other >> Mar 10, 2023  3:29 PM Oley Balm E wrote: Reason for CRM: Pt called stating the dates that she was advised by the doctor to stay out of work and also the day she was advised to return back to work full duty. She says please include these dates below:  OUT of work 03/18 until 03/26 (returned to work on march 26 full duty)   Please upload to [employer]

## 2023-03-10 NOTE — Telephone Encounter (Signed)
Patient states that Trulicity is on back order but they have Ozempic in stock

## 2023-03-11 ENCOUNTER — Telehealth: Payer: Self-pay

## 2023-03-11 MED ORDER — SEMAGLUTIDE(0.25 OR 0.5MG/DOS) 2 MG/3ML ~~LOC~~ SOPN: 0.5000 mg | PEN_INJECTOR | SUBCUTANEOUS | 6 refills | Status: DC

## 2023-03-11 NOTE — Telephone Encounter (Signed)
   Patient Name: Sarah Duffy  DOB: 07/22/1971 MRN: [id number]  Primary Cardiologist: Kate Sable, MD  Chart reviewed as part of pre-operative protocol coverage. She has known severe multivessel, un-revascularized coronary artery disease and is currently being medically managed.  She is capable of achieving >4.0 METS without symptoms or limitations.  She should remain on GDMT throughout the peri-procedural period with close monitoring of BP and HR during and after sedation.  Based on ACC/AHA guidelines, Sarah Duffy is at acceptable risk for planned EGD without further cardiovascular testing.   Please call with questions.  Murray Hodgkins, NP 03/11/2023, 2:40 PM

## 2023-03-11 NOTE — Telephone Encounter (Signed)
PA needed for Ozempic

## 2023-03-11 NOTE — Telephone Encounter (Signed)
This is fine if we can get her a not written up for these dates that will be great

## 2023-03-11 NOTE — Telephone Encounter (Signed)
Letter written

## 2023-03-11 NOTE — Telephone Encounter (Signed)
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 Please advise.

## 2023-03-15 ENCOUNTER — Other Ambulatory Visit: Payer: Self-pay

## 2023-03-15 ENCOUNTER — Telehealth: Payer: Self-pay

## 2023-03-15 ENCOUNTER — Encounter: Payer: Self-pay | Admitting: Pharmacist

## 2023-03-15 ENCOUNTER — Other Ambulatory Visit: Payer: 59 | Admitting: Pharmacist

## 2023-03-15 ENCOUNTER — Telehealth: Payer: Self-pay | Admitting: Pharmacist

## 2023-03-15 DIAGNOSIS — R1310 Dysphagia, unspecified: Secondary | ICD-10-CM

## 2023-03-15 NOTE — Telephone Encounter (Signed)
Cardiac clearance has been granted for patient to have EGD per fax received on 03/11/23 from Murray Hodgkins, NP at [facility].  Left voice message for patient to see if she still wants to have EGD scheduled.  Thanks, [place], [place]

## 2023-03-15 NOTE — Progress Notes (Unsigned)
Dr. Ancil Boozer,  Patient is monitoring her blood sugar using Dexcom G7 CGM. However, she does not currently have a glucometer to perform fingerstick checks if/when needed for back up or to verify reading.  Would you mind sending in prescriptions for Accu-Chek Guide meter, test strips and lancets to her [facility]?  Thank you!  Wallace Cullens, PharmD, [facility] [phone]

## 2023-03-15 NOTE — Telephone Encounter (Signed)
This encounter was created in error - please disregard.

## 2023-03-15 NOTE — Telephone Encounter (Signed)
EGD scheduled with Dr. Vicente Males 03/23/23 for Dysphagia.  Instructions reviewed and sent via mychart.    Cardiac Clearance granted.  Thanks, [place], [place]

## 2023-03-15 NOTE — Progress Notes (Unsigned)
03/15/2023 Name: Sarah Duffy MRN: [id number] DOB: 1971/06/11  Chief Complaint  Patient presents with   Medication Management    Sarah Duffy is a 52 y.o. year old female who presented for a telephone visit.   They were referred to the pharmacist by a quality report for assistance in managing diabetes and medication access.   Patient is participating in a Managed Medicaid Plan:  Yes, [place]  Subjective:  Care Team: Primary Care Provider: Steele Sizer, MD ; Next Scheduled Visit: 08/09/2023 Cardiologist: Kate Sable, MD/ Theora Gianotti, NP; Next Scheduled Visit: 06/03/2023 Cardiothoracic Surgery: Pricilla Larsson Endocrinologist [name], Melanie Crazier, MD; Next Scheduled Visit: 09/07/2023 Gastroenterologist: Jonathon Bellows, MD; EGD procedure Scheduled for: 03/23/2023  Medication Access/Adherence  Current Pharmacy:  [id number] [place], [address] [place] [address] Phone: [phone] Fax: [place] [id number], [place] - [address] [place] [address] Phone: [phone] Fax: [phone]   Patient reports affordability concerns with their medications: No  Patient reports access/transportation concerns to their pharmacy: Yes  Patient reports adherence concerns with their medications:  Yes , reports having difficulty with getting started on Trulicity 1.5 mg weekly as prescribed by Endocrinologist on 03/04/2023  Denies using weekly pillbox; takes from bottles  Diabetes:  Patient follows with Dr. Kelton Pillar at [facility]  Current medications:  Basaglar (insulin glargine) 90 units daily Novolog insulin - going to follow sliding scale to use three times daily before breakfast, lunch and supper Today reports has not yet started taking Novolog as directed by Endocrinologist as had not yet taken the paper with provider's  instruction (from [facility]S) out of her car Jardiance 25 mg daily Today reports has not yet been able to start Trulicity 1.5 mg weekly as prescribed by Endocrinologist on 03/04/2023 as her pharmacy did not have this strength in stock  Medications tried in the past: metformin, glipizide, Victoza  Patient using Dexcom G7 continuous glucose monitoring (CGM) Shares past 14-days of CGM data using Dexcom Clarity App today:     Patient denies hypoglycemic s/sx including dizziness, shakiness, sweating.   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    Objective:  Lab Results  Component Value Date   HGBA1C 8.6 (A) 03/04/2023    Lab Results  Component Value Date   CREATININE 0.93 03/09/2023   BUN 18 03/09/2023   NA 140 03/09/2023   K 4.1 03/09/2023   CL 108 03/09/2023   CO2 24 03/09/2023    Lab Results  Component Value Date   CHOL 138 03/09/2023   HDL 45 (L) 03/09/2023   LDLCALC 79 03/09/2023   TRIG 64 03/09/2023   CHOLHDL 3.1 03/09/2023   BP Readings from Last 3 Encounters:  03/09/23 114/72  03/04/23 120/80  03/03/23 104/68   Pulse Readings from Last 3 Encounters:  03/09/23 88  03/04/23 74  03/03/23 82  Medications Reviewed Today     Reviewed by Rennis Petty, RPH-CPP (Pharmacist) on 03/15/23 at 2141  Med List Status: <None>   Medication Order Taking? Sig Documenting Provider Last Dose Status Informant   atorvastatin (LIPITOR) 80 MG tablet [id number] Yes Take 1 tablet (80 mg total) by mouth daily. Steele Sizer, MD Taking Active   carvedilol (COREG) 12.5 MG tablet [id number] Yes Take 1 tablet (12.5 mg total) by mouth 2 (two) times daily. Theora Gianotti, NP Taking Active   Continuous Blood Gluc Sensor ([place]) [place] [id number] Yes Use as directed. Apply 1 sensor every 10 days [provider]  Active   Dulaglutide (TRULICITY) 1.5 0000000 SOPN [id number]  Inject 1.5 mg into the skin once a week. [name], Melanie Crazier, MD  Active   ENTRESTO 24-26 [place] [id number] Yes Take 1 tablet by mouth 2 (two) times daily. [provider] Taking Active   insulin aspart (NOVOLOG FLEXPEN) 100 UNIT/ML FlexPen [id number]  Max daily 30 units [name], Melanie Crazier, MD  Active   insulin glargine-yfgn (SEMGLEE) 100 UNIT/ML Pen [id number] Yes INJECT 90 UNITS INTO THE SKIN DAILY [name], Melanie Crazier, MD Taking Active   Insulin Pen Needle 32G X 4 MM MISC [id number]  1 Device by Does not apply route in the morning, at noon, in the evening, and at bedtime. [name], Melanie Crazier, MD  Active   JARDIANCE 25 MG TABS tablet [id number] Yes Take 1 tablet (25 mg total) by mouth daily. [name], Melanie Crazier, MD Taking Active   nitroGLYCERIN (NITROSTAT) 0.4 MG SL tablet [id number]  PLACE 1 TABLET UNDER THE TONGUE EVERY 5 MINUTES AS NEEDED FOR CHEST PAIN. Kate Sable, MD  Active   omeprazole (PRILOSEC) 40 MG capsule [id number] Yes Take 1 capsule (40 mg total) by mouth 2 (two) times daily before a meal. Jonathon Bellows, MD Taking Active   Semaglutide,0.25 or 0.5MG /DOS, 2 MG/3ML [place] [id number] No Inject 0.5 mg into the skin once a week.  Patient not taking: Reported on 03/15/2023   [name], Melanie Crazier, MD Not Taking Active   spironolactone (ALDACTONE) 25 MG tablet [id number] Yes Take 1 tablet (25 mg total) by mouth daily. Kate Sable, MD Taking Active   sucralfate  (CARAFATE) 1 GM/10ML suspension [id number] Yes Take 10 mLs (1 g total) by mouth 4 (four) times daily.  Patient taking differently: Take 1 g by mouth 4 (four) times daily as needed.   Jonathon Bellows, MD Taking Active   triamcinolone cream (KENALOG) 0.1 % [id number] Yes Apply 1 Application topically 2 (two) times daily. Steele Sizer, MD Taking Active               Assessment/Plan:   Comprehensive medication review performed; medication list updated in electronic medical record  Encourage patient to start using weekly pillbox to aid with medication adherence - Patient interested in discussing possibility of using pill packaging pharmacy during our next telephone call  Reports spoke with Sharyn Lull at [facility] Gastroenterology at [facility] today and scheduled EGD procedure for 4/9. Confirms received instructions for medications needing to be held prior to procedure  Will place referral to Managed Medicaid Social Worker as patient interested in guidance about requesting disability. 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   Diabetes: - Currently uncontrolled - Reviewed long term cardiovascular and renal outcomes of uncontrolled blood sugar - [facility]  Pharmacy does not currently have Trulicity 1.5 mg strength in stock Pharmacy received Rx therapeutic alternative Ozempic 0.5 mg, but PA for this prescription has not been completed - Discuss with patient and call around to local pharmacies. [facility] in [place] has Trulicity 1.5 mg Rx in stock. Request pharmacy transfer and fill Trulicity Rx for patient Patient states will pick up Trulicity Rx from pharmacy tomorrow, but states prefers to start taking after upcoming EGD procedure as was advised to hold Trulicity for 1 week prior to procedure -  Recommend to start taking Novolog as directed by Endocrinologist on 3/21 per correctional scale from provider Patient locates [facility] instructions from visit with provider on 3/21 during our call. Patient reviews instructions and confirms will start taking as directed - Recommend to continue to monitor glucose using Dexcom G7 CGM, use CGM feedback and to contact Endocrinology office if needed for readings outside of established parameters, any episodes of hypoglycemia or other symptoms - Patient verbalizes understanding of symptoms of hypoglycemia and how to manage low blood sugars  Encourage patient to continue to carry glucose tablets to use if needed - Will collaborate with PCP to request provider send new Rxs for Accu-Chek Guide meter, test strips and lancets to pharmacy for patient. Discuss importance of having glucometer available to perform fingerstick checks if/when needed for back up or to verify reading     Follow Up Plan: ***  ***

## 2023-03-16 DIAGNOSIS — E113512 Type 2 diabetes mellitus with proliferative diabetic retinopathy with macular edema, left eye: Secondary | ICD-10-CM | POA: Diagnosis not present

## 2023-03-16 NOTE — Patient Instructions (Signed)
Goals Addressed             This Visit's Progress    Pharmacy Goals       Our goal A1c is less than 7%. This corresponds with fasting sugars less than 130 and 2 hour after meal sugars less than 180.  Please consider using a weekly pillbox to aid with taking your medications consistently.  Wallace Cullens, PharmD, [facility] [phone]

## 2023-03-17 ENCOUNTER — Telehealth: Payer: Self-pay

## 2023-03-17 ENCOUNTER — Other Ambulatory Visit: Payer: Self-pay | Admitting: Pharmacist

## 2023-03-17 ENCOUNTER — Other Ambulatory Visit (HOSPITAL_COMMUNITY): Payer: Self-pay

## 2023-03-17 ENCOUNTER — Encounter: Payer: Self-pay | Admitting: *Deleted

## 2023-03-17 NOTE — Progress Notes (Signed)
   03/17/2023  Patient ID: Sarah Duffy, female   DOB: 09/04/1971, 52 y.o.   MRN: [id number]  Coordination of Care Call  Receive a voicemail from patient stating that she received a call from [facility] yesterday stating that her Trulicity Rx could not be filled as Rx requiring a prior authorization.  Follow up with [facility] today. [facility] RPh advises their system is showing patient to have active Aetna prescription coverage, in addition to her [place] Medicaid, Medicaid coverage requires Aetna to be billed first and their pharmacy is non-preferred for her [place] plan.  Follow up with CPhT Renold Don from the [facility] (PA) team.  - [facility] CPhT contacts patient's Aetna plan and advises per [facility], patient has a balance due with this insurance and that claim will not go through plan until balance paid - In meantime, [facility] CPhT will start PAs for both patient's Aetna and Managed Medicaid coverages for Ozempic as requested by Endocrinology office from 3/28 (switched from Trulicity to Ozempic due to back order of Trulicity 1.5 mg strength)  Reach patient by telephone today. She advises that she previously had prescription coverage through [facility], but thought that this coverage would stop when she became eligible for Managed Medicaid  Plan: 1) Patient states that she is going to contact [place] today about canceling her previous coverage and then will follow up with [place] Medicaid plan to let Medicaid know that she no longer has other coverage.  2) Patient to contact Endocrinology regarding requesting new prescriptions for Accu-Chek Guide meter, test strips and lancets   Follow Up Plan: Clinical Pharmacist will outreach to patient by telephone again on 03/24/2023 at 11 am    Wallace Cullens, PharmD, [place] [phone]

## 2023-03-17 NOTE — Patient Instructions (Signed)
Goals Addressed             This Visit's Progress    Pharmacy Goals       Our goal A1c is less than 7%. This corresponds with fasting sugars less than 130 and 2 hour after meal sugars less than 180.  Please consider using a weekly pillbox to aid with taking your medications consistently.   Wallace Cullens, PharmD, [facility] [phone]

## 2023-03-17 NOTE — Telephone Encounter (Signed)
Patient Advocate Encounter   Received notification that prior authorization is required for Ozempic  Pt has 2 insurances  Submitted: 03/17/23 [employer] Rx [place]  [employer] Key: [place]  Status for both is pending

## 2023-03-18 NOTE — Telephone Encounter (Signed)
Pharmacy Patient Advocate Encounter  Prior Authorization for Ozempic has been approved by [employer] (ins).    PA # [id number] Effective dates: 03/17/23 through 03/16/2024

## 2023-03-22 ENCOUNTER — Encounter: Payer: Self-pay | Admitting: Gastroenterology

## 2023-03-23 ENCOUNTER — Ambulatory Visit: Payer: Medicaid Other | Admitting: Anesthesiology

## 2023-03-23 ENCOUNTER — Other Ambulatory Visit: Payer: Self-pay

## 2023-03-23 ENCOUNTER — Encounter: Payer: Self-pay | Admitting: Gastroenterology

## 2023-03-23 ENCOUNTER — Ambulatory Visit
Admission: RE | Admit: 2023-03-23 | Discharge: 2023-03-23 | Disposition: A | Discharge status: Home / Self Care | Payer: Medicaid Other | Attending: Gastroenterology | Admitting: Gastroenterology

## 2023-03-23 ENCOUNTER — Encounter
Admission: RE | Disposition: A | Discharge status: Home / Self Care | Payer: Self-pay | Source: Home / Self Care | Attending: Gastroenterology

## 2023-03-23 DIAGNOSIS — E1140 Type 2 diabetes mellitus with diabetic neuropathy, unspecified: Secondary | ICD-10-CM | POA: Insufficient documentation

## 2023-03-23 DIAGNOSIS — E1122 Type 2 diabetes mellitus with diabetic chronic kidney disease: Secondary | ICD-10-CM | POA: Diagnosis not present

## 2023-03-23 DIAGNOSIS — I5032 Chronic diastolic (congestive) heart failure: Secondary | ICD-10-CM | POA: Insufficient documentation

## 2023-03-23 DIAGNOSIS — R1310 Dysphagia, unspecified: Secondary | ICD-10-CM

## 2023-03-23 DIAGNOSIS — N183 Chronic kidney disease, stage 3 unspecified: Secondary | ICD-10-CM | POA: Insufficient documentation

## 2023-03-23 DIAGNOSIS — Z833 Family history of diabetes mellitus: Secondary | ICD-10-CM | POA: Diagnosis not present

## 2023-03-23 DIAGNOSIS — Z7984 Long term (current) use of oral hypoglycemic drugs: Secondary | ICD-10-CM | POA: Diagnosis not present

## 2023-03-23 DIAGNOSIS — I2510 Atherosclerotic heart disease of native coronary artery without angina pectoris: Secondary | ICD-10-CM | POA: Diagnosis not present

## 2023-03-23 DIAGNOSIS — Z794 Long term (current) use of insulin: Secondary | ICD-10-CM | POA: Insufficient documentation

## 2023-03-23 DIAGNOSIS — I130 Hypertensive heart and chronic kidney disease with heart failure and stage 1 through stage 4 chronic kidney disease, or unspecified chronic kidney disease: Secondary | ICD-10-CM | POA: Insufficient documentation

## 2023-03-23 DIAGNOSIS — Z8249 Family history of ischemic heart disease and other diseases of the circulatory system: Secondary | ICD-10-CM | POA: Insufficient documentation

## 2023-03-23 DIAGNOSIS — T8859XA Other complications of anesthesia, initial encounter: Secondary | ICD-10-CM

## 2023-03-23 DIAGNOSIS — K219 Gastro-esophageal reflux disease without esophagitis: Secondary | ICD-10-CM | POA: Insufficient documentation

## 2023-03-23 HISTORY — PX: ESOPHAGOGASTRODUODENOSCOPY (EGD) WITH PROPOFOL: SHX5813

## 2023-03-23 HISTORY — DX: Other complications of anesthesia, initial encounter: T88.59XA

## 2023-03-23 LAB — POCT PREGNANCY, URINE: Preg Test, Ur: NEGATIVE

## 2023-03-23 SURGERY — ESOPHAGOGASTRODUODENOSCOPY (EGD) WITH PROPOFOL
Anesthesia: General

## 2023-03-23 MED ORDER — SODIUM CHLORIDE 0.9 % IV SOLN: INTRAVENOUS | Status: DC

## 2023-03-23 MED ORDER — PROPOFOL 10 MG/ML IV BOLUS
INTRAVENOUS | Status: DC | PRN
  Administered 2023-03-23: 120 mg via INTRAVENOUS

## 2023-03-23 MED ORDER — LIDOCAINE HCL (CARDIAC) PF 100 MG/5ML IV SOSY
PREFILLED_SYRINGE | INTRAVENOUS | Status: DC | PRN
  Administered 2023-03-23: 100 mg via INTRAVENOUS

## 2023-03-23 NOTE — Transfer of Care (Signed)
Immediate Anesthesia Transfer of Care Note  Patient: Sarah Duffy  Procedure(s) Performed: ESOPHAGOGASTRODUODENOSCOPY (EGD) WITH PROPOFOL  Patient Location: Endoscopy Unit  Anesthesia Type:General  Level of Consciousness: awake, alert , and oriented  Airway & Oxygen Therapy: Patient Spontanous Breathing  Post-op Assessment: Report given to RN, Post -op Vital signs reviewed and stable, and Patient moving all extremities  Post vital signs: Reviewed and stable  Last Vitals:  Vitals Value Taken Time  BP 105/51 03/23/23 0902  Temp 36.2 C 03/23/23 0901  Pulse 72 03/23/23 0903  Resp 13 03/23/23 0903  SpO2 100 % 03/23/23 0903  Vitals shown include unvalidated device data.  Last Pain:  Vitals:   03/23/23 0901  TempSrc: Temporal  PainSc: 0-No pain         Complications: No notable events documented.

## 2023-03-23 NOTE — Op Note (Signed)
[facility] Patient Name: Sarah Duffy Procedure Date: 03/23/2023 8:52 AM MRN: [id number] Account #: [id number] Date of Birth: October 10, 1971 Admit Type: Outpatient Age: 52 Room: [facility] ENDO ROOM 2 Gender: Female Note Status: Finalized Instrument Name: Upper Endoscope [id number] Procedure:             Upper GI endoscopy Indications:           Dysphagia Providers:             [name] MD, MD Medicines:             Monitored Anesthesia Care Complications:         No immediate complications. Procedure:             Pre-Anesthesia Assessment:                        - Prior to the procedure, a History and Physical was                         performed, and patient medications, allergies and                         sensitivities were reviewed. The patient's tolerance                         of previous anesthesia was reviewed.                        - The risks and benefits of the procedure and the                         sedation options and risks were discussed with the                         patient. All questions were answered and informed                         consent was obtained.                        - ASA Grade Assessment: II - A patient with mild                         systemic disease.                        After obtaining informed consent, the endoscope was                         passed under direct vision. Throughout the procedure,                         the patient's blood pressure, pulse, and oxygen                         saturations were monitored continuously. The Endoscope                         was introduced through the mouth, with the intention  of advancing to the duodenum. The scope was advanced                         to the gastric body before the procedure was aborted.                         Medications were given. The upper GI endoscopy was                         accomplished with ease. The patient  tolerated the                         procedure well. The procedure was aborted due to                         presence of food. The upper GI endoscopy was                         accomplished with ease. The patient tolerated the                         procedure well. Findings:      A large amount of food (residue) was found on the greater curvature of       the stomach. Impression:            - The procedure was aborted due to presence of food.                        - A large amount of food (residue) in the stomach.                        - No specimens collected. Recommendation:        - Discharge patient to home (with escort).                        - Resume previous diet.                        - Continue present medications.                        - Repeat upper endoscopy in 2 weeks because the                         preparation was poor. Procedure Code(s):     --- Professional ---                        [phone], 52, Esophagogastroduodenoscopy, flexible,                         transoral; diagnostic, including collection of                         specimen(s) by brushing or washing, when performed                         (separate procedure) Diagnosis Code(s):     --- Professional ---  R13.10, Dysphagia, unspecified CPT copyright 2022 American Medical Association. All rights reserved. The codes documented in this report are preliminary and upon coder review may  be revised to meet current compliance requirements. Wyline MoodKiran [name], MD Wyline MoodKiran [name] MD, MD 03/23/2023 8:58:34 AM This report has been signed electronically. Number of Addenda: 0 Note Initiated On: 03/23/2023 8:52 AM Total Procedure Duration: 0 hours 1 minute 2 seconds  Estimated Blood Loss:  Estimated blood loss: none.      [facility]

## 2023-03-23 NOTE — H&P (Signed)
Wyline MoodKiran Tukker Byrns, MD [address], [address], [place], [place], [address] [address], [address], [place], [place], [address] Phone: [phone]  Fax: [phone]  Primary Care Physician:  Alba CorySowles, Krichna, MD   Pre-Procedure History & Physical: HPI:  Sarah Duffy is a 52 y.o. 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 02/2021 Echo: EF 35-40%, glob HK, GrI DD, nl RV fxn, mildly dil LA, mod MR; b. 06/2022 Echo: EF 50-55%, no rwma, GrII DD, nl RV fxn, RVSP 21mmHg, mildly dil LA, mild MR, AoV sclerosis.   CKD (chronic kidney disease), stage III    Coronary artery disease    a. 04/2021 Cath: LM nl, LAD 70p/m, 7512m, 80d, LCX 3334m, OM1 40, OM2 99 (fills via collats from OM1), RCA 60d, RPDA 75.   COVID-19 2021   Hyperlipidemia LDL goal <70    Hypertension    Ischemic cardiomyopathy    a. 02/2021 Echo: EF 35-40%, glob HK, GrI DD; b. 06/2022 Echo: EF 50-55%.   Moderate mitral regurgitation    a. 02/2021 Echo: Mod MR.   MRSA infection within last 3 months 02/25/2016   Osteomyelitis of foot 08/26/2016   Type II diabetes mellitus     Past Surgical History:  Procedure Laterality Date   CHOLECYSTECTOMY  1999   COLONOSCOPY WITH PROPOFOL N/A 03/25/2021   Procedure: COLONOSCOPY WITH PROPOFOL;  Surgeon: Wyline MoodAnna, Myleen Brailsford, MD;  [facility];  Service: Gastroenterology;  Laterality: N/A;   COLONOSCOPY WITH PROPOFOL N/A 04/17/2021   Procedure: COLONOSCOPY WITH PROPOFOL;  Surgeon: Wyline MoodAnna, Hallel Denherder, MD;  [facility];  Service: Gastroenterology;  Laterality: N/A;   ESOPHAGOGASTRODUODENOSCOPY (EGD) WITH PROPOFOL N/A 03/25/2021   Procedure: ESOPHAGOGASTRODUODENOSCOPY (EGD) WITH PROPOFOL;  Surgeon: Wyline MoodAnna, Saverio Kader, MD;  [facility];  Service: Gastroenterology;  Laterality: N/A;   ESOPHAGOGASTRODUODENOSCOPY (EGD) WITH PROPOFOL N/A 04/17/2021   Procedure: ESOPHAGOGASTRODUODENOSCOPY  (EGD) WITH PROPOFOL;  Surgeon: Wyline MoodAnna, Elyzabeth Goatley, MD;  [facility];  Service: Gastroenterology;  Laterality: N/A;   RIGHT/LEFT HEART CATH AND CORONARY ANGIOGRAPHY Bilateral 04/14/2021   Procedure: RIGHT/LEFT HEART CATH AND CORONARY ANGIOGRAPHY;  Surgeon: Yvonne KendallEnd, Christopher, MD;  Location: [facility] INVASIVE CV LAB;  Service: Cardiovascular;  Laterality: Bilateral;   TOE SURGERY Left 02/07/2016   Pinky Toe    Prior to Admission medications   Medication Sig Start Date End Date Taking? Authorizing Provider  atorvastatin (LIPITOR) 80 MG tablet Take 1 tablet (80 mg total) by mouth daily. 10/29/22   Alba CorySowles, Krichna, MD  carvedilol (COREG) 12.5 MG tablet Take 1 tablet (12.5 mg total) by mouth 2 (two) times daily. 08/11/22   Creig HinesBerge, Christopher Ronald, NP  Continuous Blood Gluc Sensor (DEXCOM G7 SENSOR) MISC Use as directed. Apply 1 sensor every 10 days    [provider]  Dulaglutide (TRULICITY) 1.5 MG/0.5ML SOPN Inject 1.5 mg into the skin once a week. 03/04/23   [name], Konrad DoloresIbtehal Jaralla, MD  ENTRESTO 24-26 MG Take 1 tablet by mouth 2 (two) times daily. 10/24/22   [provider]  insulin aspart (NOVOLOG FLEXPEN) 100 UNIT/ML FlexPen Max daily 30 units 03/04/23   [name], Konrad DoloresIbtehal Jaralla, MD  insulin glargine-yfgn (SEMGLEE) 100 UNIT/ML Pen INJECT 90 UNITS INTO THE SKIN DAILY 03/04/23   [name], Konrad DoloresIbtehal Jaralla, MD  Insulin Pen Needle 32G X 4 MM MISC 1 Device by Does not apply route in the morning, at noon, in the evening,  and at bedtime. 03/04/23   [name], [name], MD  JARDIANCE 25 MG TABS tablet Take 1 tablet (25 mg total) by mouth daily. 03/04/23   [name], [name], MD  nitroGLYCERIN (NITROSTAT) 0.4 MG SL tablet PLACE 1 TABLET UNDER THE TONGUE EVERY 5 MINUTES AS NEEDED FOR CHEST PAIN. 12/29/22   [name], MD  omeprazole (PRILOSEC) 40 MG capsule Take 1 capsule (40 mg total) by mouth 2 (two) times daily before a meal. 03/03/23   [name], MD   Semaglutide,0.25 or 0.5MG /DOS, 2 MG/3ML SOPN Inject 0.5 mg into the skin once a week. Patient not taking: Reported on 03/15/2023 03/11/23   [name], [name], MD  spironolactone (ALDACTONE) 25 MG tablet Take 1 tablet (25 mg total) by mouth daily. 10/12/22   [name], MD  sucralfate (CARAFATE) 1 GM/10ML suspension Take 10 mLs (1 g total) by mouth 4 (four) times daily. Patient taking differently: Take 1 g by mouth 4 (four) times daily as needed. 03/03/23   [name], MD  triamcinolone cream (KENALOG) 0.1 % Apply 1 Application topically 2 (two) times daily. 03/09/23   [name], MD    Allergies as of 03/15/2023   (No Known Allergies)    Family History  Problem Relation Age of Onset   Diabetes Mother    Ulcers Mother    Heart disease Father    AAA (abdominal aortic aneurysm) Father    Diabetes Father    Hypertension Father    Stroke Father    Alzheimer's disease Father    Heart attack Sister    Seizures Brother    Diabetes Maternal Grandmother    Breast cancer Maternal Grandmother     Social History   Socioeconomic History   Marital status: Single    Spouse name: Not on file   Number of children: 1   Years of education: Not on file   Highest education level: 12th grade  Occupational History   Occupation: Dealer   Tobacco Use   Smoking status: Never   Smokeless tobacco: Never  Vaping Use   Vaping Use: Never used  Substance and Sexual Activity   Alcohol use: No    Alcohol/week: 0.0 standard drinks of alcohol   Drug use: No   Sexual activity: Yes    Partners: Male    Birth control/protection: Injection  Other Topics Concern   Not on file  Social History Narrative   She used to work at [employer], but  Feb 2017 she left work because of  MRSA infection.osteomyelitis and uncontrolled DM. She has been back to work since Feb 2018   Lives alone   Social Determinants of Health   Financial Resource Strain: Medium Risk (05/01/2022)    Overall Financial Resource Strain (CARDIA)    Difficulty of Paying Living Expenses: Somewhat hard  Food Insecurity: Food Insecurity Present (03/09/2023)   Hunger Vital Sign    Worried About Running Out of Food in the Last Year: Sometimes true    Ran Out of Food in the Last Year: Sometimes true  Transportation Needs: No Transportation Needs (03/09/2023)   PRAPARE - Administrator, Civil Service (Medical): No    Lack of Transportation (Non-Medical): No  Physical Activity: Insufficiently Active (03/09/2023)   Exercise Vital Sign    Days of Exercise per Week: 3 days    Minutes of Exercise per Session: 20 min  Stress: Stress Concern Present (03/09/2023)   [employer] of Occupational Health - Occupational Stress Questionnaire  Feeling of Stress : To some extent  Social Connections: Moderately Isolated (03/09/2023)   Social Connection and Isolation Panel [NHANES]    Frequency of Communication with Friends and Family: More than three times a week    Frequency of Social Gatherings with Friends and Family: Twice a week    Attends Religious Services: 1 to 4 times per year    Active Member of [employer] or Organizations: No    Attends Banker Meetings: Never    Marital Status: Never married  Intimate Partner Violence: Not At Risk (05/01/2022)   Humiliation, Afraid, Rape, and Kick questionnaire    Fear of Current or Ex-Partner: No    Emotionally Abused: No    Physically Abused: No    Sexually Abused: No    Review of Systems: See HPI, otherwise negative ROS  Physical Exam: LMP  (LMP Unknown)  General:   Alert,  pleasant and cooperative in NAD Head:  Normocephalic and atraumatic. Neck:  Supple; no masses or thyromegaly. Lungs:  Clear throughout to auscultation, normal respiratory effort.    Heart:  +S1, +S2, Regular rate and rhythm, No edema. Abdomen:  Soft, nontender and nondistended. Normal bowel sounds, without guarding, and without rebound.   Neurologic:  Alert  and  oriented x4;  grossly normal neurologically.  Impression/Plan: Sarah Duffy is here for an endoscopy  to be performed for  evaluation of dysphagia    Risks, benefits, limitations, and alternatives regarding endoscopy have been reviewed with the patient.  Questions have been answered.  All parties agreeable.   [name], MD  03/23/2023, 8:15 AM

## 2023-03-23 NOTE — Anesthesia Preprocedure Evaluation (Addendum)
Anesthesia Evaluation  Patient identified by MRN, date of birth, ID band Patient awake    Reviewed: Allergy & Precautions, H&P , NPO status , Patient's Chart, lab work & pertinent test results, reviewed documented beta blocker date and time   History of Anesthesia Complications Negative for: history of anesthetic complications  Airway Mallampati: I  TM Distance: >3 FB Neck ROM: full    Dental  (+) Dental Advidsory Given, Missing, Teeth Intact   Pulmonary neg pulmonary ROS   Pulmonary exam normal breath sounds clear to auscultation       Cardiovascular Exercise Tolerance: Good hypertension, + CAD (04/2021 Cath: LM nl, LAD 70p/m, 81m, 80d, LCX 44m, OM1 40, OM2 99 (fills via collats from OM1), RCA 60d, RPDA 75.) and +CHF (Improved on last echo 7/23)  Normal cardiovascular exam+ Valvular Problems/Murmurs MR  Rhythm:regular Rate:Normal     Neuro/Psych  Headaches PSYCHIATRIC DISORDERS Anxiety Depression     Neuromuscular disease (neuropathy)    GI/Hepatic Neg liver ROS,GERD  ,,  Endo/Other  diabetes, Type 2    Renal/GU Renal InsufficiencyRenal disease  negative genitourinary   Musculoskeletal   Abdominal Normal abdominal exam  (+)   Peds negative pediatric ROS (+)  Hematology negative hematology ROS (+)   Anesthesia Other Findings Past Medical History: No date: Diabetes mellitus without complication (HCC) 02/25/2016: MRSA infection within last 3 months 08/26/2016: Osteomyelitis of foot (HCC)   Reproductive/Obstetrics negative OB ROS                             Anesthesia Physical Anesthesia Plan  ASA: 2  Anesthesia Plan: General   Post-op Pain Management:    Induction: Intravenous  PONV Risk Score and Plan: 3 and TIVA and Propofol infusion  Airway Management Planned: Natural Airway and Nasal Cannula  Additional Equipment:   Intra-op Plan:   Post-operative Plan:   Informed  Consent: I have reviewed the patients History and Physical, chart, labs and discussed the procedure including the risks, benefits and alternatives for the proposed anesthesia with the patient or authorized representative who has indicated his/her understanding and acceptance.     Dental Advisory Given  Plan Discussed with: Anesthesiologist, CRNA and Surgeon  Anesthesia Plan Comments:         Anesthesia Quick Evaluation

## 2023-03-23 NOTE — Anesthesia Postprocedure Evaluation (Signed)
Anesthesia Post Note  Patient: Kyanna Baskette  Procedure(s) Performed: ESOPHAGOGASTRODUODENOSCOPY (EGD) WITH PROPOFOL  Patient location during evaluation: Endoscopy Anesthesia Type: General Level of consciousness: awake and alert Pain management: pain level controlled Vital Signs Assessment: post-procedure vital signs reviewed and stable Respiratory status: spontaneous breathing, nonlabored ventilation and respiratory function stable Cardiovascular status: blood pressure returned to baseline and stable Postop Assessment: no apparent nausea or vomiting Anesthetic complications: no   No notable events documented.   Last Vitals:  Vitals:   03/23/23 0837 03/23/23 0901  BP: 135/70 (!) 105/51  Pulse: 69 69  Resp: 20 20  Temp: (!) 36.1 C (!) 36.2 C  SpO2: 100% 99%    Last Pain:  Vitals:   03/23/23 0913  TempSrc:   PainSc: 0-No pain                 Foye Deer

## 2023-03-24 ENCOUNTER — Other Ambulatory Visit: Payer: Medicaid Other

## 2023-03-24 ENCOUNTER — Other Ambulatory Visit: Payer: Self-pay | Admitting: Nurse Practitioner

## 2023-03-24 ENCOUNTER — Encounter: Payer: Self-pay | Admitting: Pharmacist

## 2023-03-24 ENCOUNTER — Encounter: Payer: Self-pay | Admitting: Gastroenterology

## 2023-03-24 ENCOUNTER — Other Ambulatory Visit: Payer: Self-pay

## 2023-03-24 ENCOUNTER — Other Ambulatory Visit: Payer: Medicaid Other | Admitting: Pharmacist

## 2023-03-24 ENCOUNTER — Telehealth: Payer: Self-pay | Admitting: Pharmacist

## 2023-03-24 DIAGNOSIS — J069 Acute upper respiratory infection, unspecified: Secondary | ICD-10-CM

## 2023-03-24 MED ORDER — ACCU-CHEK MULTICLIX LANCETS MISC: 12 refills | Status: AC

## 2023-03-24 MED ORDER — ACCU-CHEK GUIDE W/DEVICE KIT: PACK | 0 refills | Status: AC

## 2023-03-24 MED ORDER — ACCU-CHEK GUIDE VI STRP: ORAL_STRIP | 12 refills | Status: AC

## 2023-03-24 NOTE — Patient Outreach (Signed)
Medicaid Managed Care Social Work Note  03/24/2023 Name:  Sarah Duffy MRN:  [id number] DOB:  03-11-1971  Sarah Duffy is an 52 y.o. year old female who is a primary patient of [name], MD.  The [facility] Managed Care Coordination team was consulted for assistance with:   disability questions  Ms. Schrieber was given information about Medicaid Managed Care Coordination team services today. [name] Patient agreed to services and verbal consent obtained.  Engaged with patient  for by telephone forinitial visit in response to referral for case management and/or care coordination services.   Assessments/Interventions:  Review of past medical history, allergies, medications, health status, including review of consultants reports, laboratory and other test data, was performed as part of comprehensive evaluation and provision of chronic care management services.  SDOH: (Social Determinant of Health) assessments and interventions performed: SDOH Interventions    Flowsheet Row Office Visit from 04/29/2021 in [facility] Office Visit from 02/11/2021 in [facility]  SDOH Interventions    Housing Interventions Other (Comment)  [name] got some information from pre-op] --  Depression Interventions/Treatment  -- Medication     BSW completed a telephone outreach with patient, she stated she has a big surgery coming up and wants to get disability to assist while she is out of work. BSW informed patient about the process of disability and SSA does not offer short term disability. No other resources are needed at this time. Patient does state she would like someone to talk to, BSW will connect patient with LCSW.  Advanced Directives Status:  Not addressed in this encounter.  Care Plan                 No Known Allergies  Medications Reviewed Today     Reviewed by Lyndon Code, RN (Registered Nurse) on 03/23/23 at 0845  Med List Status:  <None>   Medication Order Taking? Sig Documenting Provider Last Dose Status Informant  atorvastatin (LIPITOR) 80 MG tablet [id number]  Take 1 tablet (80 mg total) by mouth daily. [name], MD  Active   carvedilol (COREG) 12.5 MG tablet [id number]  Take 1 tablet (12.5 mg total) by mouth 2 (two) times daily. Creig Hines, NP  Active   Continuous Blood Gluc Sensor (DEXCOM G7 SENSOR) [place] [id number]  Use as directed. Apply 1 sensor every 10 days [provider]  Active   Dulaglutide (TRULICITY) 1.5 MG/0.5ML SOPN [id number]  Inject 1.5 mg into the skin once a week. [name], [name], MD  Active            Med Note Ernest Mallick, DAVA L   Tue Mar 23, 2023  8:22 AM) Has not starting  ENTRESTO 24-26 MG [id number] Yes Take 1 tablet by mouth 2 (two) times daily. [provider] 03/22/2023 Active   insulin aspart (NOVOLOG FLEXPEN) 100 UNIT/ML FlexPen [id number] Yes Max daily 30 units [name], [name], MD 03/22/2023 Active   insulin glargine-yfgn (SEMGLEE) 100 UNIT/ML Pen [id number]  INJECT 90 UNITS INTO THE SKIN DAILY [name], [name], MD  Active   Insulin Pen Needle 32G X 4 MM MISC [id number]  1 Device by Does not apply route in the morning, at noon, in the evening, and at bedtime. [name], [name], MD  Active   JARDIANCE 25 MG TABS tablet [id number] No Take 1 tablet (25 mg total) by mouth daily. [name], [name], MD 03/19/2023 Active   nitroGLYCERIN (NITROSTAT) 0.4 MG SL tablet [id number]  PLACE 1 TABLET UNDER THE TONGUE EVERY 5 MINUTES AS NEEDED FOR CHEST PAIN. [name], MD  Active   omeprazole (PRILOSEC) 40 MG capsule [id number]  Take 1 capsule (40 mg total) by mouth 2 (two) times daily before a meal. [name], MD  Active   Semaglutide,0.25 or 0.5MG /DOS, 2 MG/3ML [name] [id number]  Inject 0.5 mg into the skin once a week.  Patient not taking: Reported on 03/15/2023   [name], [name], MD  Active   spironolactone  (ALDACTONE) 25 MG tablet [id number]  Take 1 tablet (25 mg total) by mouth daily. [name], MD  Active   sucralfate (CARAFATE) 1 GM/10ML suspension [id number] No Take 10 mLs (1 g total) by mouth 4 (four) times daily.  Patient taking differently: Take 1 g by mouth 4 (four) times daily as needed.   [name], MD 03/19/2023 Active   triamcinolone cream (KENALOG) 0.1 % [id number]  Apply 1 Application topically 2 (two) times daily. [name], MD  Active             Patient Active Problem List   Diagnosis Date Noted   Dysphagia 03/23/2023   Asthma, well controlled 03/09/2023   Depression, major, in remission 03/09/2023   Perennial allergic rhinitis with seasonal variation 03/09/2023   Type 2 diabetes mellitus with diabetic polyneuropathy, with long-term current use of insulin 08/31/2022   Type 2 diabetes mellitus with retinopathy, with long-term current use of insulin 08/31/2022   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08/31/2022   Hypertension 06/11/2022   Ischemic cardiomyopathy 06/11/2022   Coronary artery disease 06/11/2022   Chronic systolic heart failure 06/11/2022   Angina pectoris associated with type 2 diabetes mellitus 01/05/2022   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 edema associated with type 2 diabetes mellitus 05/01/2021   Cardiomyopathy 04/14/2021   Depression, major, recurrent, mild 09/25/2019   History of 2019 novel coronavirus disease (COVID-19) 06/23/2019   Migraine headache without aura 01/14/2018   Type II diabetes mellitus 05/22/2017   Diabetic retinopathy of left eye, with macular edema, with severe nonproliferative retinopathy, associated with type 2 diabetes mellitus 05/21/2016   Severe nonproliferative diabetic retinopathy of right eye without macular edema associated with type 2 diabetes mellitus 05/21/2016   Hyperlipidemia LDL goal <70 03/10/2016   MI (mitral incompetence) 02/25/2016   Insomnia 02/25/2016   Anxiety  09/04/2014    Conditions to be addressed/monitored per PCP order:   disability questions  There are no care plans that you recently modified to display for this patient.   Follow up:  Patient agrees to Care Plan and Follow-up.  Plan: The Managed Medicaid care management team will reach out to the patient again over the next 30 days.  Date/time of next scheduled Social Work care management/care coordination outreach: 04/23/23  Gus Puma, Kenard Gower, [facility]  Managed [facility] Social Worker [phone]

## 2023-03-24 NOTE — Progress Notes (Signed)
I am a pharmacist with the [facility] team currently working with this patient.  Today found that she does not have an upper arm blood pressure monitor. She is eligible for home upper arm blood pressure monitor through her Managed Medicaid coverage, but must be billed at eligible DME supplier   Would you please consider faxing a prescription for an upper arm blood pressure monitor to [facility] at fax # [phone], including on Rx the ICD-10 code? DME supplier also requesting copy of recent in-person provider note   Thank you!   Estelle Grumbles, PharmD, [facility] [phone]

## 2023-03-24 NOTE — Patient Instructions (Signed)
Goals Addressed             This Visit's Progress    Pharmacy Goals       Our goal A1c is less than 7%. This corresponds with fasting sugars less than 130 and 2 hour after meal sugars less than 180.  Here is a link for the "how to take" Ozempic video that we discussed:   [email or web address]     Also, the following link is the handout with reminders for checking your blood pressure:   [email or web address]     I have sent a message to your Cardiology team asking if they would send a prescription for an upper arm blood pressure monitor for you to [facility] (Phone # [phone]).   Please let me know if you have any medication related questions/concerns.   Estelle Grumbles, PharmD, [facility] Medical Group [phone]

## 2023-03-24 NOTE — Patient Instructions (Signed)
Visit Information  Ms. Sanford was given information about Medicaid Managed Care team care coordination services as a part of their [facility] Community Plan Medicaid benefit. Polixeni Browe verbally consented to engagement with the [facility] Managed Care team.   If you are experiencing a medical emergency, please call 911 or report to your local emergency department or urgent care.   If you have a non-emergency medical problem during routine business hours, please contact your provider's office and ask to speak with a nurse.   For questions related to your [facility], please call: [phone] or visit the homepage here: [email or web address]  If you would like to schedule transportation through your [facility], please call the following number at least 2 days in advance of your appointment: [phone]   Rides for urgent appointments can also be made after hours by calling Member Services.  Call the Behavioral Health Crisis Line at [phone], at any time, 24 hours a day, 7 days a week. If you are in danger or need immediate medical attention call 911.  If you would like help to quit smoking, call 1-800-QUIT-NOW (684-853-9886) OR Espaol: 1-855-Djelo-Ya (8-127-517-0017) o para ms informacin haga clic aqu or Text READY to 494-496 to register via text  Ms. Capwell - following are the goals we discussed in your visit today:   Goals Addressed   None       Social Worker will follow up in 30 days.   Gus Puma, Kenard Gower, M[facility]  Managed Medicaid Social Worker [id number]   Following is a copy of your plan of care:  There are no care plans that you recently modified to display for this patient.

## 2023-03-24 NOTE — Progress Notes (Signed)
03/24/2023 Name: Sarah Brownsngela Tague MRN: [id number] DOB: 06-29-1971  Chief Complaint  Patient presents with   Medication Management   Medication Assistance    Sarah Duffy is a 52 y.o. year old female who presented for a telephone visit.   They were referred to the pharmacist by a quality report for assistance in managing diabetes and medication access.    Patient is participating in a Managed Medicaid Plan:  [facility]  Subjective:  Care Team: Primary Care Provider: Alba CorySowles, Krichna, MD ; Next Scheduled Visit: 08/09/2023 Cardiologist: Debbe OdeaAgbor-Etang, Brian, MD/ Creig HinesBerge, Christopher Ronald, NP; Next Scheduled Visit: 06/03/2023 Cardiothoracic Surgery: Ruthell RummageBartle, Bryan K, M Endocrinologist [name], Konrad DoloresIbtehal Jaralla, MD; Next Scheduled Visit: 09/07/2023 Gastroenterologist: Wyline MoodAnna, Kiran, MD Podiatry: Candelaria StagersPatel, Kevin P, DPM; Next Scheduled Visit: 04/09/2023  Medication Access/Adherence  Current Pharmacy:  [id number] [place] [place] - [address] [place] [id number] Phone: [phone] Fax: [phone]  [id number] [place] - [address] [facility] [address] [place] [place] [id number] Phone: [phone] Fax: [phone]   Patient reports affordability concerns with their medications: No  Patient reports access/transportation concerns to their pharmacy: No  Patient reports adherence concerns with their medications:  No    Reports has started using a weekly pillbox  Today patient advises/per review of notes from physician, EGD procedure on 4/9  was aborted due to presence of a large amount of food in the stomach. Patient discharged, but advised procedure to be repeated in 2 weeks. - Patient states plans to follow up with GI specialist to reschedule   Diabetes:   Patient follows with [facility]   Current medications:  Basaglar (insulin glargine) 90 units daily Novolog insulin -  reports following sliding scale from Endocrinologist three times daily before breakfast, lunch and supper Jardiance 25 mg daily Today reports planning to start Ozempic 0.5 mg weekly as prescribed by Endocrinologist  Confirms successfully closed her coverage with [employer], let Medicaid know and updated pharmacy [facility]  Medications tried in the past: metformin, glipizide, Victoza   Patient using Dexcom G7 continuous glucose monitoring (CGM) Shares past 14-days of CGM data using Dexcom Clarity App today:    Patient reports CGM not active for a couple of days as waited until after her procedure yesterday to apply a new sensor   Patient denies hypoglycemic s/sx including dizziness, shakiness, sweating.    Current physical activity: Stays active throughout the day working in [employer]  Reports left a message with Endocrinology today to request prescriptions for Accu-Chek Guide meter and supplies be sent to her pharmacy    Hypertension/Heart Failure:   Current medications:  ACEi/ARB/ARNI: Entresto 24-26 mg twice daily SGLT2i: Jardiance 25 mg daily Beta blocker: carvedilol 12.5 mg twice daily Mineralocorticoid Receptor Antagonist: spironolactone 25 mg daily    Denies having an upper arm blood pressure monitor Reports has an old wrist monitor, but denies recently monitoring home BP  Denies symptoms of hypotension, such as dizziness or lightheadedness   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       Hyperlipidemia/ASCVD Risk Reduction   Current lipid lowering medications: atorvastatin 80 mg daily     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     Objective:  Lab Results  Component Value  Date   HGBA1C 8.6 (A) 03/04/2023    Lab Results  Component Value Date   CREATININE 0.93 03/09/2023   BUN 18 03/09/2023   NA 140 03/09/2023   K  4.1 03/09/2023   CL 108 03/09/2023   CO2 24 03/09/2023    Lab Results  Component Value Date   CHOL 138 03/09/2023   HDL 45 (L) 03/09/2023   LDLCALC 79 03/09/2023   TRIG 64 03/09/2023   CHOLHDL 3.1 03/09/2023   BP Readings from Last 3 Encounters:  03/23/23 (!) 105/51  03/09/23 114/72  03/04/23 120/80   Pulse Readings from Last 3 Encounters:  03/23/23 69  03/09/23 88  03/04/23 74     Medications Reviewed Today     Reviewed by Lyndon Code, RN (Registered Nurse) on 03/23/23 at 0845  Med List Status: <None>   Medication Order Taking? Sig Documenting Provider Last Dose Status Informant  atorvastatin (LIPITOR) 80 MG tablet [id number]  Take 1 tablet (80 mg total) by mouth daily. [name], MD  Active   carvedilol (COREG) 12.5 MG tablet [id number]  Take 1 tablet (12.5 mg total) by mouth 2 (two) times daily. Creig Hines, NP  Active   Continuous Blood Gluc Sensor (DEXCOM G7 SENSOR) [place] [id number]  Use as directed. Apply 1 sensor every 10 days [provider]  Active   Dulaglutide (TRULICITY) 1.5 MG/0.5ML SOPN [id number]  Inject 1.5 mg into the skin once a week. [name], [name], MD  Active            Med Note Ernest Mallick, DAVA L   Tue Mar 23, 2023  8:22 AM) Has not starting  ENTRESTO 24-26 MG [id number] Yes Take 1 tablet by mouth 2 (two) times daily. [provider] 03/22/2023 Active   insulin aspart (NOVOLOG FLEXPEN) 100 UNIT/ML FlexPen [id number] Yes Max daily 30 units [name], [name], MD 03/22/2023 Active   insulin glargine-yfgn (SEMGLEE) 100 UNIT/ML Pen [id number]  INJECT 90 UNITS INTO THE SKIN DAILY [name], [name], MD  Active   Insulin Pen Needle 32G X 4 MM MISC [id number]  1 Device by Does not apply route in the morning, at noon, in the evening, and at bedtime. [name], [name], MD  Active   JARDIANCE 25 MG TABS tablet [id number] No Take 1 tablet (25 mg total) by mouth daily. [name], [name], MD  03/19/2023 Active   nitroGLYCERIN (NITROSTAT) 0.4 MG SL tablet [id number]  PLACE 1 TABLET UNDER THE TONGUE EVERY 5 MINUTES AS NEEDED FOR CHEST PAIN. [name], MD  Active   omeprazole (PRILOSEC) 40 MG capsule [id number]  Take 1 capsule (40 mg total) by mouth 2 (two) times daily before a meal. [name], MD  Active   Semaglutide,0.25 or 0.5MG /DOS, 2 MG/3ML [name] [id number]  Inject 0.5 mg into the skin once a week.  Patient not taking: Reported on 03/15/2023   [name], [name], MD  Active   spironolactone (ALDACTONE) 25 MG tablet [id number]  Take 1 tablet (25 mg total) by mouth daily. [name], MD  Active   sucralfate (CARAFATE) 1 GM/10ML suspension [id number] No Take 10 mLs (1 g total) by mouth 4 (four) times daily.  Patient taking differently: Take 1 g by mouth 4 (four) times daily as needed.   [name], MD 03/19/2023 Active   triamcinolone cream (KENALOG) 0.1 % [id number]  Apply 1 Application topically 2 (two) times daily. [name], MD  Active  Assessment/Plan:   Patient to follow up with [facility] regarding rescheduling of EGD procedure  Encourage patient to continue using weekly pillbox to aid with adherence  Patient to follow up with pharmacy to pick up prescriptions for Ozempic, blood sugar testing supplies as well as refills of carvedilol and Jardiance   Diabetes: - Currently uncontrolled - Patient plans to pick up and start taking Ozempic as directed by Endocrinologist - Recommend to continue to monitor glucose using Dexcom G7 CGM, use CGM feedback and to contact Endocrinology office if needed for readings outside of established parameters, any episodes of hypoglycemia or other symptoms  Hypertension/Heart Failure: - Reviewed appropriate blood pressure monitoring technique and reviewed goal blood pressure.  - Collaborate with Cardiology to request provider send a prescription for a upper arm blood pressure monitor into eligible  DME supplier for patient to receive this monitor through her Medicaid coverage Provide patient with phone number for [employer] (Phone # [phone]) to follow up regarding new monitor - Send patient link to blood pressure monitoring handout via MyChart message - Encourage patient to monitor home blood pressure, keep log of results and have this record to review during upcoming appointments    Follow Up Plan: Clinical Pharmacist will follow up with patient by telephone on 04/07/2023 at 10:30 am  Estelle Grumbles, PharmD, [facility] [phone]

## 2023-03-25 ENCOUNTER — Other Ambulatory Visit: Payer: Self-pay | Admitting: Gastroenterology

## 2023-03-25 ENCOUNTER — Other Ambulatory Visit: Payer: Self-pay

## 2023-03-25 ENCOUNTER — Telehealth: Payer: Self-pay

## 2023-03-25 MED ORDER — NITROGLYCERIN 0.4 MG SL SUBL: SUBLINGUAL_TABLET | SUBLINGUAL | 3 refills | Status: DC

## 2023-03-25 NOTE — Telephone Encounter (Signed)
Unable to refill per protocol, Rx expired. Discontinued 03/04/23.  Requested Prescriptions  Pending Prescriptions Disp Refills   albuterol (VENTOLIN HFA) 108 (90 Base) MCG/ACT inhaler [Pharmacy Med Name: ALBUTEROL HFA (PROVENTIL) INH] 6.7 each     Sig: TAKE 2 PUFFS BY MOUTH EVERY 6 HOURS AS NEEDED FOR WHEEZE OR SHORTNESS OF BREATH     Pulmonology:  Beta Agonists 2 Passed - 03/24/2023 11:57 AM      Passed - Last BP in normal range    BP Readings from Last 1 Encounters:  03/23/23 (!) 105/51         Passed - Last Heart Rate in normal range    Pulse Readings from Last 1 Encounters:  03/23/23 69         Passed - Valid encounter within last 12 months    Recent Outpatient Visits           2 weeks ago Chronic systolic heart failure [facility])   [facility] [name], MD   3 weeks ago Acute cough   [facility] Berniece Salines, FNP   3 months ago Body aches   [facility] Della Goo F, FNP   4 months ago 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[facility])   [facility] [name], MD   7 months ago 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[facility])   [facility] [name], MD       Future Appointments             In 2 months Brion Aliment, Dois Daven[place], NP [facility] [place]   In 4 months [name], MD [facility]

## 2023-03-25 NOTE — Telephone Encounter (Signed)
..   Medicaid Managed Care   Unsuccessful Outreach Note  03/25/2023 Name: Sarah Duffy MRN: [id number] DOB: August 26, 1971  Referred by: [name], MD Reason for referral : Appointment   An unsuccessful telephone outreach was attempted today. The patient was referred to the case management team for assistance with care management and care coordination.   Follow Up Plan: A HIPAA compliant phone message was left for the patient providing contact information and requesting a return call.  The care management team will reach out to the patient again over the next 7 days.   Weston Settle Care Guide  [facility] Managed  [facility]  [phone]

## 2023-03-26 ENCOUNTER — Other Ambulatory Visit (HOSPITAL_COMMUNITY): Payer: Self-pay

## 2023-03-26 ENCOUNTER — Encounter: Payer: Self-pay | Admitting: Internal Medicine

## 2023-03-26 ENCOUNTER — Telehealth: Payer: Self-pay

## 2023-03-26 ENCOUNTER — Encounter: Payer: Self-pay | Admitting: Cardiology

## 2023-03-26 ENCOUNTER — Other Ambulatory Visit: Payer: Self-pay | Admitting: *Deleted

## 2023-03-26 NOTE — Telephone Encounter (Signed)
Patient Advocate Encounter   Received notification from pt msgs that prior authorization is required for Jardiance 25MG  tablets  Submitted: 03/26/23 Key [id number]   Sent for expedited review  Status is pending

## 2023-03-26 NOTE — Telephone Encounter (Signed)
Prior Authorization required Thank you!

## 2023-03-26 NOTE — Telephone Encounter (Unsigned)
Patient Advocate Encounter   Received notification from [facility] that prior authorization is required for Dexcom G7 Sensor  Submitted: 03/26/23 Key [id number]  Status is pending

## 2023-03-29 ENCOUNTER — Other Ambulatory Visit (HOSPITAL_COMMUNITY): Payer: Self-pay

## 2023-03-29 ENCOUNTER — Other Ambulatory Visit: Payer: Self-pay | Admitting: *Deleted

## 2023-03-29 MED ORDER — ENTRESTO 24-26 MG PO TABS: 1.0000 | ORAL_TABLET | Freq: Two times a day (BID) | ORAL | 2 refills | Status: DC

## 2023-03-29 MED ORDER — ENTRESTO 24-26 MG PO TABS: 1.0000 | ORAL_TABLET | Freq: Two times a day (BID) | ORAL | 0 refills | Status: DC

## 2023-03-29 NOTE — Telephone Encounter (Signed)
Pharmacy Patient Advocate Encounter  Prior Authorization for [employer] has been approved by [employer] (ins).    PA # [id number] Effective dates: 03/26/23 through 09/25/23  When I run a test claim it says Member has Alt. Insurance. Mbr has other 3rd Party Cov. Left a HIPAA compliant VM for the pt to call back in regard to this matter. (For Jardiance also)

## 2023-03-29 NOTE — Telephone Encounter (Signed)
Please see note below. 

## 2023-03-29 NOTE — Telephone Encounter (Signed)
Patient picked up 2 samples of 25 mg Jardiance today in office.

## 2023-03-29 NOTE — Telephone Encounter (Signed)
Pharmacy Patient Advocate Encounter  Prior Authorization for Jardiance 25MG  tablets has been approved by [place] [employer] Medicaid (ins).    PA # [id number] Effective dates:  through 03/25/24  When I run a test claim it says Member has Alt. Insurance. Mbr has other 3rd Party Cov. Left a HIPAA compliant VM for the pt to call back in regard to this matter. (For Dexcom also)

## 2023-03-29 NOTE — Telephone Encounter (Signed)
Patient is following up. See previous patient message. Patient states an authorization is needed within 24 hours and we are unable to send it in within that time frame she would like to know if there are any samples at the office. Please advise.

## 2023-04-01 ENCOUNTER — Telehealth: Payer: Self-pay

## 2023-04-01 ENCOUNTER — Other Ambulatory Visit (HOSPITAL_COMMUNITY): Payer: Self-pay

## 2023-04-01 NOTE — Telephone Encounter (Signed)
Pharmacy Patient Advocate Encounter  Prior Authorization for ENTRESTO 24-26MG  TAB has been approved.    PA# [id number] Effective dates: 04/01/23 through 03/31/24  Haze Rushing, CPhT Pharmacy Patient Advocate Specialist Direct Number: [phone] Fax: [phone]

## 2023-04-01 NOTE — Telephone Encounter (Addendum)
Pharmacy Patient Advocate Encounter   Received notification from [facility] MEDICARE that prior authorization for ENTRESTO 24-26 MG TAB is needed.    PA submitted on 04/01/23 Key [id number] Status is pending  Haze Rushing, CPhT Pharmacy Patient Advocate Specialist Direct Number: [phone] Fax: [phone]

## 2023-04-01 NOTE — Telephone Encounter (Signed)
PA is pending see separate encounter

## 2023-04-02 ENCOUNTER — Other Ambulatory Visit: Payer: Medicaid Other | Admitting: Licensed Clinical Social Worker

## 2023-04-02 NOTE — Patient Outreach (Signed)
error 

## 2023-04-02 NOTE — Patient Instructions (Signed)
[name] ,   [facility] Managed Care Team is available to provide assistance to you with your healthcare needs at no cost and as a benefit of your [facility] plan. I'm sorry I was unable to reach you today for our scheduled appointment. Our care guide will call you to reschedule our telephone appointment. Please call me at the number below. I am available to be of assistance to you regarding your healthcare needs. .   Thank you,   Dickie La, BSW, MSW, LCSW Managed Medicaid LCSW [facility]  [address] [email or web address] Phone: [phone]

## 2023-04-02 NOTE — Patient Outreach (Signed)
  Medicaid Managed Care   Unsuccessful Attempt Note   04/02/2023 Name: Sarah Duffy MRN: [id number] DOB: Mar 09, 1971  Referred by: [name], MD Reason for referral : No chief complaint on file.   An unsuccessful telephone outreach was attempted today. The patient was referred to the case management team for assistance with care management and care coordination.    Follow Up Plan: A HIPAA compliant phone message was left for the patient providing contact information and requesting a return call. [facility] LCSW routed encounter to [facility] Scheduling Care Guide for rescheduling.,   Dickie La, BSW, MSW, LCSW Managed Medicaid LCSW [facility]  [email or web address] Phone: [phone]

## 2023-04-04 DIAGNOSIS — M79645 Pain in left finger(s): Secondary | ICD-10-CM | POA: Diagnosis not present

## 2023-04-04 DIAGNOSIS — M545 Low back pain, unspecified: Secondary | ICD-10-CM | POA: Diagnosis not present

## 2023-04-05 ENCOUNTER — Other Ambulatory Visit: Payer: Medicaid Other | Admitting: Licensed Clinical Social Worker

## 2023-04-05 ENCOUNTER — Ambulatory Visit: Payer: Medicaid Other | Admitting: Licensed Clinical Social Worker

## 2023-04-05 NOTE — Patient Outreach (Signed)
Medicaid Managed Care Social Work Note  04/05/2023 Name:  Sarah Duffy MRN:  [id number] DOB:  Sep 12, 1971  Sarah Duffy is an 52 y.o. year old female who is a primary patient of Sarah Cory, MD.  The Medicaid Managed Care Coordination team was consulted for assistance with:  Mental Health Counseling and Resources  Ms. Paccione was given information about Medicaid Managed Care Coordination team services today. Sarah Duffy Patient agreed to services and verbal consent obtained.  Engaged with patient  for by telephone forinitial visit in response to referral for case management and/or care coordination services.   Assessments/Interventions:  Review of past medical history, allergies, medications, health status, including review of consultants reports, laboratory and other test data, was performed as part of comprehensive evaluation and provision of chronic care management services.  SDOH: (Social Determinant of Health) assessments and interventions performed: SDOH Interventions    Flowsheet Row Patient Outreach Telephone from 04/05/2023 in [facility] Office Visit from 04/29/2021 in [facility] Office Visit from 02/11/2021 in [facility]  SDOH Interventions     Housing Interventions -- Other (Comment)  Sarah Duffy got some information from pre-op] --  Transportation Interventions Intervention Not Indicated -- --  Depression Interventions/Treatment  -- -- Medication  Stress Interventions Offered [employer], Provide Counseling -- --       Advanced Directives Status:  See Care Plan for related entries.  Care Plan                 No Known Allergies  Medications Reviewed Today     Reviewed by Lyndon Code, RN (Registered Nurse) on 03/23/23 at 0845  Med List Status: <None>   Medication Order Taking? Sig Documenting Provider Last Dose Status Informant  atorvastatin (LIPITOR) 80 MG tablet  [id number]  Take 1 tablet (80 mg total) by mouth daily. Sarah Cory, MD  Active   carvedilol (COREG) 12.5 MG tablet [id number]  Take 1 tablet (12.5 mg total) by mouth 2 (two) times daily. Creig Hines, NP  Active   Continuous Blood Gluc Sensor (DEXCOM G7 SENSOR) [place] [id number]  Use as directed. Apply 1 sensor every 10 days [provider]  Active   Dulaglutide (TRULICITY) 1.5 MG/0.5ML SOPN [id number]  Inject 1.5 mg into the skin once a week. [name], [name], MD  Active            Med Note Ernest Mallick, DAVA L   Tue Mar 23, 2023  8:22 AM) Has not starting  ENTRESTO 24-26 MG [id number] Yes Take 1 tablet by mouth 2 (two) times daily. [provider] 03/22/2023 Active   insulin aspart (NOVOLOG FLEXPEN) 100 UNIT/ML FlexPen [id number] Yes Max daily 30 units [name], [name], MD 03/22/2023 Active   insulin glargine-yfgn (SEMGLEE) 100 UNIT/ML Pen [id number]  INJECT 90 UNITS INTO THE SKIN DAILY [name], [name], MD  Active   Insulin Pen Needle 32G X 4 MM MISC [id number]  1 Device by Does not apply route in the morning, at noon, in the evening, and at bedtime. [name], [name], MD  Active   JARDIANCE 25 MG TABS tablet [id number] No Take 1 tablet (25 mg total) by mouth daily. [name], [name], MD 03/19/2023 Active   nitroGLYCERIN (NITROSTAT) 0.4 MG SL tablet [id number]  PLACE 1 TABLET UNDER THE TONGUE EVERY 5 MINUTES AS NEEDED FOR CHEST PAIN. Sarah Odea, MD  Active   omeprazole (PRILOSEC) 40 MG capsule [id number]  Take 1 capsule (40  mg total) by mouth 2 (two) times daily before a meal. Sarah Mood, MD  Active   Semaglutide,0.25 or 0.5MG /DOS, 2 MG/3ML Sarah Duffy [id number]  Inject 0.5 mg into the skin once a week.  Patient not taking: Reported on 03/15/2023   [name], [name], MD  Active   spironolactone (ALDACTONE) 25 MG tablet [id number]  Take 1 tablet (25 mg total) by mouth daily. Sarah Odea, MD  Active   sucralfate  (CARAFATE) 1 GM/10ML suspension [id number] No Take 10 mLs (1 g total) by mouth 4 (four) times daily.  Patient taking differently: Take 1 g by mouth 4 (four) times daily as needed.   Sarah Mood, MD 03/19/2023 Active   triamcinolone cream (KENALOG) 0.1 % [id number]  Apply 1 Application topically 2 (two) times daily. Sarah Cory, MD  Active             Patient Active Problem List   Diagnosis Date Noted   Dysphagia 03/23/2023   Asthma, well controlled 03/09/2023   Depression, major, in remission 03/09/2023   Perennial allergic rhinitis with seasonal variation 03/09/2023   Type 2 diabetes mellitus with diabetic polyneuropathy, with long-term current use of insulin 08/31/2022   Type 2 diabetes mellitus with retinopathy, with long-term current use of insulin 08/31/2022   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08/31/2022   Hypertension 06/11/2022   Ischemic cardiomyopathy 06/11/2022   Coronary artery disease 06/11/2022   Chronic systolic heart failure 06/11/2022   Angina pectoris associated with type 2 diabetes mellitus 01/05/2022   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 edema associated with type 2 diabetes mellitus 05/01/2021   Cardiomyopathy 04/14/2021   Depression, major, recurrent, mild 09/25/2019   History of 2019 novel coronavirus disease (COVID-19) 06/23/2019   Migraine headache without aura 01/14/2018   Type II diabetes mellitus 05/22/2017   Diabetic retinopathy of left eye, with macular edema, with severe nonproliferative retinopathy, associated with type 2 diabetes mellitus 05/21/2016   Severe nonproliferative diabetic retinopathy of right eye without macular edema associated with type 2 diabetes mellitus 05/21/2016   Hyperlipidemia LDL goal <70 03/10/2016   MI (mitral incompetence) 02/25/2016   Insomnia 02/25/2016   Anxiety 09/04/2014    Conditions to be addressed/monitored per PCP order:  Depression  Care Plan : LCSW Plan of Care  Updates made by  Gustavus Bryant, LCSW since 04/05/2023 12:00 AM     Problem: Depression Identification (Depression)      Goal: Depressive Symptoms Identified   Start Date: 04/05/2023  Priority: High  Note:   Priority: High  Timeframe:  Long-Range Goal Priority:  High Start Date:   04/05/23           Expected End Date:  ongoing                     Follow Up Date--04/12/23 at 130 pm  - keep 90 percent of scheduled appointments -consider counseling or psychiatry -consider bumping up your self-care  -consider creating a stronger support network   Why is this important?             Combatting depression may take some time.            If you don't feel better right away, don't give up on your treatment plan.    Current barriers:   Chronic Mental Health needs related to depression, stress and anxiety. Patient requires Support, Education, Resources, Referrals, Advocacy, and Care Coordination, in order to meet Unmet Mental Health Needs. Grief from several  family members  Patient will implement clinical interventions discussed today to decrease symptoms of depression and increase knowledge and/or ability of: coping skills. Mental Health Concerns and Social Isolation Physical concerns that are contributing to her depression and stress Patient lacks knowledge of available community counseling agencies and resources.  Clinical Goal(s): verbalize understanding of plan for management of Depression, and Stress and demonstrate a reduction in symptoms. Patient will connect with a provider for ongoing mental health treatment, increase coping skills, healthy habits, self-management skills, and stress reduction        Clinical Interventions:  Assessed patient's previous and current treatment, coping skills, support system and barriers to care. Patient provided hx  Referral made to [facility] on 04/05/23 per patient request for heart failure and diabetes education. Verbalization of feelings encouraged, motivational  interviewing employed Emotional support provided, positive coping strategies explored. Establishing healthy boundaries emphasized and healthy self-care education provided Patient was educated on available mental health resources within their area that accept Medicaid and offer counseling and psychiatry. Email sent to patient today with available mental health resources within her area that accept Medicaid and offer the services that she is interested in. Email included instructions for scheduling at [facility] as well as some crisis support resources and [facility]'s walk in clinic hours. LCSW provided education on relaxation techniques such as meditation, deep breathing, massage, grounding exercises or yoga that can activate the body's relaxation response and ease symptoms of stress and anxiety. LCSW ask that when pt is struggling with difficult emotions and racing thoughts that they start this relaxation response process. LCSW provided extensive education on healthy coping skills for anxiety. SW used active and reflective listening, validated patient's feelings/concerns, and provided emotional support. Patient will work on implementing appropriate self-care habits into their daily routine such as: staying positive, writing a gratitude list, drinking water, staying active around the house, taking their medications as prescribed, combating negative thoughts or emotions and staying connected with their family and friends. Positive reinforcement provided for this decision to work on this. LCSW provided education on healthy sleep hygiene and healthy self-care and what that looks like. LCSW encouraged patient to implement a night time routine into their schedule that works best for them and that they are able to maintain. Advised patient to implement deep breathing/grounding/meditation/self-care exercises into their nightly routine to combat racing thoughts at night. LCSW encouraged patient to wake up at the same time each  day, make their sleeping environment comfortable, exercise when able, to limit naps and to not eat or drink anything right before bed.  Motivational Interviewing employed Depression screen reviewed  PHQ2/ PHQ9 completed or reviewed  Mindfulness or Relaxation training provided Active listening / Reflection utilized  Advance Care and HCPOA education provided Emotional Support Provided Problem Solving /Task Center strategies reviewed Provided psychoeducation for mental health needs  Provided brief CBT  Reviewed mental health medications and discussed importance of compliance:  Quality of sleep assessed & Sleep Hygiene techniques promoted  Participation in counseling encouraged  Verbalization of feelings encouraged  Suicidal Ideation/Homicidal Ideation assessed: Patient denies SI/HI  Review resources, discussed options and provided patient information about  Mental Health Resources Inter-disciplinary care team collaboration (see longitudinal plan of care) Patient Goals/Self-Care Activities: Over the next 120 days Attend scheduled medical appointments Utilize healthy coping skills and supportive resources discussed Contact PCP with any questions or concerns Keep 90 percent of counseling appointments Call your insurance provider for more information about your Enhanced Benefits  Check out counseling resources provided  Begin personal counseling with LCSW, to reduce and manage symptoms of Depression and Stress, until well-established with mental health provider Accept all calls from representative with [facility] in an effort to establish ongoing mental health counseling and supportive services. Incorporate into daily practice - relaxation techniques, deep breathing exercises, and mindfulness meditation strategies. Talk about feelings with friends, family members, spiritual advisor, etc. Contact LCSW directly [phone]), if you have questions, need assistance, or if additional social work  needs are identified between now and our next scheduled telephone outreach call. Call 988 for mental health hotline/crisis line if needed (24/7 available) Try techniques to reduce symptoms of anxiety/negative thinking (deep breathing, distraction, positive self talk, etc)  - develop a personal safety plan - develop a plan to deal with triggers like holidays, anniversaries - exercise at least 2 to 3 times per week - have a plan for how to handle bad days - journal feelings and what helps to feel better or worse - spend time or talk with others at least 2 to 3 times per week - watch for early signs of feeling worse - begin personal counseling - call and visit an old friend - check out volunteer opportunities - join a support group - laugh; watch a funny movie or comedian - learn and use visualization or guided imagery - perform a random act of kindness - practice relaxation or meditation daily - start or continue a personal journal - practice positive thinking and self-talk -continue with compliance of taking medication  -identify current effective and ineffective coping strategies.  -implement positive self-talk in care to increase self-esteem, confidence and feelings of control.  -consider alternative and complementary therapy approaches such as meditation, mindfulness or yoga.  -journaling, prayer, worship services, meditation or pastoral counseling.  -increase participation in pleasurable group activities such as hobbies, singing, sports or volunteering).  -consider the use of meditative movement therapy such as tai chi, yoga or qigong.  -start a regular daily exercise program based on tolerance, ability and patient choice to support positive thinking and activity    If you are experiencing a Mental Health or Behavioral Health Crisis or need someone to talk to, please call the Suicide and Crisis Lifeline: 988    Patient Goals: Initial goal     Follow up:  Patient agrees to Care  Plan and Follow-up.  Plan: The Managed Medicaid care management team will reach out to the patient again over the next 30 days.  Dickie La, BSW, MSW, [employer] Managed Medicaid LCSW [facility]  [facility] [email or web address] Phone: [phone]

## 2023-04-05 NOTE — Patient Instructions (Signed)
Visit Information  Ms. Seith was given information about Medicaid Managed Care team care coordination services as a part of their [facility] Community Plan Medicaid benefit. Damiah Mcdonald verbally consented to engagement with the [facility] Managed Care team.   If you are experiencing a medical emergency, please call 911 or report to your local emergency department or urgent care.   If you have a non-emergency medical problem during routine business hours, please contact your provider's office and ask to speak with a nurse.   For questions related to your [facility], please call: [phone] or visit the homepage here: [email or web address]  If you would like to schedule transportation through your [facility], please call the following number at least 2 days in advance of your appointment: [phone]   Rides for urgent appointments can also be made after hours by calling Member Services.  Call the Behavioral Health Crisis Line at ([phone], at any time, 24 hours a day, 7 days a week. If you are in danger or need immediate medical attention call 911.  If you would like help to quit smoking, call 1-800-QUIT-NOW ((231)221-9837) OR Espaol: 1-855-Djelo-Ya (2-536-644-0347) o para ms informacin haga clic aqu or Text READY to 425-956 to register via text   Following is a copy of your plan of care:  Care Plan : LCSW Plan of Care  Updates made by Gustavus Bryant, LCSW since 04/05/2023 12:00 AM     Problem: Depression Identification (Depression)      Goal: Depressive Symptoms Identified   Start Date: 04/05/2023  Priority: High  Note:   Priority: High  Timeframe:  Long-Range Goal Priority:  High Start Date:   04/05/23           Expected End Date:  ongoing                     Follow Up Date--04/12/23 at 130 pm  - keep 90 percent of scheduled appointments -consider  counseling or psychiatry -consider bumping up your self-care  -consider creating a stronger support network   Why is this important?             Combatting depression may take some time.            If you don't feel better right away, don't give up on your treatment plan.    Current barriers:   Chronic Mental Health needs related to depression, stress and anxiety. Patient requires Support, Education, Resources, Referrals, Advocacy, and Care Coordination, in order to meet Unmet Mental Health Needs. Grief from several family members  Patient will implement clinical interventions discussed today to decrease symptoms of depression and increase knowledge and/or ability of: coping skills. Mental Health Concerns and Social Isolation Physical concerns that are contributing to her depression and stress Patient lacks knowledge of available community counseling agencies and resources.  Clinical Goal(s): verbalize understanding of plan for management of Depression, and Stress and demonstrate a reduction in symptoms. Patient will connect with a provider for ongoing mental health treatment, increase coping skills, healthy habits, self-management skills, and stress reduction        Patient Goals/Self-Care Activities: Over the next 120 days Attend scheduled medical appointments Utilize healthy coping skills and supportive resources discussed Contact PCP with any questions or concerns Keep 90 percent of counseling appointments Call your insurance provider for more information about your Enhanced Benefits  Check out counseling resources provided  Begin personal counseling with LCSW, to reduce  and manage symptoms of Depression and Stress, until well-established with mental health provider Accept all calls from representative with [facility] in an effort to establish ongoing mental health counseling and supportive services. Incorporate into daily practice - relaxation techniques, deep breathing exercises, and  mindfulness meditation strategies. Talk about feelings with friends, family members, spiritual advisor, etc. Contact LCSW directly [phone]), if you have questions, need assistance, or if additional social work needs are identified between now and our next scheduled telephone outreach call. Call 988 for mental health hotline/crisis line if needed (24/7 available) Try techniques to reduce symptoms of anxiety/negative thinking (deep breathing, distraction, positive self talk, etc)  - develop a personal safety plan - develop a plan to deal with triggers like holidays, anniversaries - exercise at least 2 to 3 times per week - have a plan for how to handle bad days - journal feelings and what helps to feel better or worse - spend time or talk with others at least 2 to 3 times per week - watch for early signs of feeling worse - begin personal counseling - call and visit an old friend - check out volunteer opportunities - join a support group - laugh; watch a funny movie or comedian - learn and use visualization or guided imagery - perform a random act of kindness - practice relaxation or meditation daily - start or continue a personal journal - practice positive thinking and self-talk -continue with compliance of taking medication  -identify current effective and ineffective coping strategies.  -implement positive self-talk in care to increase self-esteem, confidence and feelings of control.  -consider alternative and complementary therapy approaches such as meditation, mindfulness or yoga.  -journaling, prayer, worship services, meditation or pastoral counseling.  -increase participation in pleasurable group activities such as hobbies, singing, sports or volunteering).  -consider the use of meditative movement therapy such as tai chi, yoga or qigong.  -start a regular daily exercise program based on tolerance, ability and patient choice to support positive thinking and activity    If  you are experiencing a Mental Health or Behavioral Health Crisis or need someone to talk to, please call the Suicide and Crisis Lifeline: 988    Patient Goals: Initial goal

## 2023-04-07 ENCOUNTER — Other Ambulatory Visit: Payer: Medicaid Other | Admitting: Pharmacist

## 2023-04-07 ENCOUNTER — Telehealth: Payer: Self-pay

## 2023-04-07 ENCOUNTER — Other Ambulatory Visit: Payer: Self-pay

## 2023-04-07 DIAGNOSIS — R1310 Dysphagia, unspecified: Secondary | ICD-10-CM

## 2023-04-07 NOTE — Progress Notes (Signed)
04/07/2023 Name: Sarah Duffy MRN: [id number] DOB: 27-Dec-1970  Chief Complaint  Patient presents with   Medication Management   Medication Adherence    Sarah Duffy is a 52 y.o. year old female who presented for a telephone visit.   They were referred to the pharmacist by a quality report for assistance in managing diabetes and medication access.    Patient is participating in a Managed Medicaid Plan:  Yes, [facility] Community Plan   Subjective:   Care Team: Primary Care Provider: [name], MD ; Next Scheduled Visit: 08/09/2023 Cardiologist: [name], MD/ Creig Hines, NP; Next Scheduled Visit: 06/03/2023 Cardiothoracic Surgery: Ruthell Rummage Endocrinologist [name], [name], MD; Next Scheduled Visit: 09/07/2023 Gastroenterologist: [name], MD Podiatry: Candelaria Stagers, DPM; Next Scheduled Visit: 04/09/2023 Managed Medicaid LCSW: Irving Burton; Next Scheduled Visit: 04/12/2023 Managed Medicaid RN: Heidi Dach, RN; Next Scheduled Visit: 04/13/2023  Medication Access/Adherence  Current Pharmacy:  [id number] [place], [place] - [address] [place] [address] Phone: [phone] Fax: [phone]  [id number] - [place], [place] - [address] AT [facility] OF [address] [place] [address] Phone: [phone] Fax: [phone]   Patient reports affordability concerns with their medications: No  Patient reports access/transportation concerns to their pharmacy: No  Patient reports adherence concerns with their medications:  No     Patient uses a weekly pillbox     Diabetes:   Patient follows with Dr. Lonzo Cloud at [facility]   Current medications:  Basaglar (insulin glargine) 90 units daily Novolog insulin - reports following sliding scale from Endocrinologist three times daily before breakfast, lunch and supper Jardiance 25 mg daily - Reports was  recently out of medication for a couple of days until picked up sample from Endocrinology Today reports planning to start Ozempic 0.5 mg weekly as prescribed by Endocrinologist  Confirms successfully closed her coverage with [employer] and Medicaid has now updated this information their system. Reports [facility] to [facility] now processing through her insurance.  Plans to up Ozempic Rx today   Medications tried in the past: metformin, glipizide, Victoza   Patient using Dexcom G7 continuous glucose monitoring (CGM) Shares past 14-days of CGM data using Dexcom Clarity App today:    Note patient has recently missed doses of Jardiance and not yet started Ozempic, but plans to start as directed by Endocrinologist   Patient denies hypoglycemic s/sx including dizziness, shakiness, sweating.    Current physical activity: Stays active throughout the day working in [employer] picked up [employer] meter and supplies and has to use as needed as back up to CGM     Hypertension/Heart Failure:   Current medications:  ACEi/ARB/ARNI: Entresto 24-26 mg twice daily Reports was recently out of medication for a couple of days until picked up sample from Cardiology SGLT2i: Jardiance 25 mg daily Reports was recently out of medication for a couple of days until picked up sample from Endocrinology Beta blocker: carvedilol 12.5 mg twice daily Mineralocorticoid Receptor Antagonist: spironolactone 25 mg daily     Denies having an upper arm blood pressure monitor Reports has an old wrist monitor, but denies recently monitoring home BP  Patient has not yet followed up with [facility] regarding picking up upper arm blood pressure monitor   Denies symptoms of hypotension, such as dizziness or lightheadedness  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       Hyperlipidemia/ASCVD Risk Reduction   Current lipid  lowering medications: atorvastatin 80 mg daily     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     Objective:  Lab Results  Component Value Date   HGBA1C 8.6 (A) 03/04/2023    Lab Results  Component Value Date   CREATININE 0.93 03/09/2023   BUN 18 03/09/2023   NA 140 03/09/2023   K 4.1 03/09/2023   CL 108 03/09/2023   CO2 24 03/09/2023    Lab Results  Component Value Date   CHOL 138 03/09/2023   HDL 45 (L) 03/09/2023   LDLCALC 79 03/09/2023   TRIG 64 03/09/2023   CHOLHDL 3.1 03/09/2023   BP Readings from Last 3 Encounters:  03/23/23 (!) 105/51  03/09/23 114/72  03/04/23 120/80   Pulse Readings from Last 3 Encounters:  03/23/23 69  03/09/23 88  03/04/23 74     Medications Reviewed Today     Reviewed by [name], RPH-CPP (Pharmacist) on 04/07/23 at 1107  Med List Status: <None>   Medication Order Taking? Sig Documenting Provider Last Dose Status Informant  atorvastatin (LIPITOR) 80 MG tablet [id number] No Take 1 tablet (80 mg total) by mouth daily. [name], MD Taking Active   Blood Glucose Monitoring Suppl (ACCU-CHEK GUIDE) w/Device KIT [id number]  Use to check blood sugar 2 time daily as needed [name], [name], MD  Active   carvedilol (COREG) 12.5 MG tablet [id number] No Take 1 tablet (12.5 mg total) by mouth 2 (two) times daily. Creig Hines, NP Taking Active   Continuous Blood Gluc Sensor (DEXCOM G7 SENSOR) [place] [id number]  Use as directed. Apply 1 sensor every 10 days [provider]  Active   Dulaglutide (TRULICITY) 1.5 MG/0.5ML SOPN [id number] No Inject 1.5 mg into the skin once a week.  Patient not taking: Reported on 03/24/2023   [name], [name], MD Not Taking Active            Med Note (ISLEY, DAVA L   Tue Mar 23, 2023  8:22 AM) Has not starting  ENTRESTO 24-26 MG [id number]  Take 1 tablet by mouth 2 (two) times daily. [name], MD  Active    glucose blood (ACCU-CHEK GUIDE) test strip [id number]  Check blood sugar 2 times daily as needed [name], [name], MD  Active   insulin aspart (NOVOLOG FLEXPEN) 100 UNIT/ML FlexPen [id number] No Max daily 30 units [name], [name], MD 03/22/2023 Active   insulin glargine-yfgn (SEMGLEE) 100 UNIT/ML Pen [id number] No INJECT 90 UNITS INTO THE SKIN DAILY [name], [name], MD Taking Active   Insulin Pen Needle 32G X 4 MM MISC [id number] No 1 Device by Does not apply route in the morning, at noon, in the evening, and at bedtime. [name], [name], MD Taking Active   JARDIANCE 25 MG TABS tablet [id number] No Take 1 tablet (25 mg total) by mouth daily. [name], [name], MD 03/19/2023 Active   Lancets (ACCU-CHEK MULTICLIX) lancets [id number]  Check sugar 2 times daily as needed. [name], [name], MD  Active   nitroGLYCERIN (NITROSTAT) 0.4 MG SL tablet [id number]  PLACE 1 TABLET UNDER THE TONGUE EVERY 5 MINUTES AS NEEDED FOR CHEST PAIN. [name], MD  Active   omeprazole (PRILOSEC) 40 MG capsule [id number]  TAKE 1 CAPSULE (40 MG TOTAL) BY MOUTH 2 (TWO) TIMES DAILY BEFORE  A MEAL. [name], MD  Active   Semaglutide,0.25 or 0.5MG /DOS, 2 MG/3ML [name] [id number] No Inject 0.5 mg into the skin once a week. [name], [name], MD Not Taking Active   spironolactone (ALDACTONE) 25 MG tablet [id number] No Take 1 tablet (25 mg total) by mouth daily. [name], MD Taking Active   sucralfate (CARAFATE) 1 GM/10ML suspension [id number] No Take 10 mLs (1 g total) by mouth 4 (four) times daily.  Patient taking differently: Take 1 g by mouth 4 (four) times daily as needed.   [name], MD 03/19/2023 Active   triamcinolone cream (KENALOG) 0.1 % [id number] No Apply 1 Application topically 2 (two) times daily. [name], MD Taking Active               Assessment/Plan:   Patient to follow up again with [facility] regarding rescheduling  of EGD procedure   Encourage patient to continue using weekly pillbox to aid with adherence   Patient to follow up with pharmacy to pick up new prescription for Ozempic as well as refills of Entresto and Jardiance  Encourage patient to use one pharmacy for syncing of medication refills to aid with adherence Patient states will [facility]     Diabetes: - Currently uncontrolled - Patient plans to pick up and start taking Ozempic as directed by Endocrinologist - Recommend to continue to monitor glucose using Dexcom G7 CGM, use CGM feedback and to contact Endocrinology office if needed for readings outside of established parameters, any episodes of hypoglycemia or other symptoms   Hypertension/Heart Failure: - Reviewed appropriate blood pressure monitoring technique and reviewed goal blood pressure.  - Collaborated with Cardiology and provider faxed a prescription for a upper arm blood pressure monitor to [facility] for patient on 4/11 Patient to follow up with [employer] (Phone # [phone]) regarding new monitor - Have sent patient link to blood pressure monitoring handout via MyChart message - Encourage patient to monitor home blood pressure, keep log of results and have this record to review during upcoming appointments     Follow Up Plan: Clinical Pharmacist will follow up with patient by telephone on 05/05/2023 at 9 am   Estelle Grumbles, PharmD, [facility] [phone]

## 2023-04-07 NOTE — Patient Instructions (Signed)
Goals Addressed             This Visit's Progress    Pharmacy Goals       Our goal A1c is less than 7%. This corresponds with fasting sugars less than 130 and 2 hour after meal sugars less than 180.  Here is a link for the "how to take" Ozempic video that we discussed:   https://www.ozempic.com/how-to-take/ozempic-dosing.html     Also, the following link is the handout with reminders for checking your blood pressure:   https://www.heart.org/-/media/Files/Health-Topics/High-Blood-Pressure/How_to_Measure_Your_Blood_Pressure_Letter_Size.pdf     I have sent a message to your Cardiology team asking if they would send a prescription for an upper arm blood pressure monitor for you to [facility] (Phone # [phone]).   Please let me know if you have any medication related questions/concerns.   Jacee Enerson Jencarlo Bonadonna, PharmD, BCACP [facility] [phone]            

## 2023-04-07 NOTE — Telephone Encounter (Signed)
Patient is calling to schedule her repeat EGD. Please call patient to schedule

## 2023-04-07 NOTE — Telephone Encounter (Signed)
Called patient to reschedule her EGD to 04/14/2023. Patient was informed to hold her Ozempic 7 days prior and her Jardiance 3 days prior. Patient's insulin is to be 1/2 dosage of her insulin the night before. Patient understood and had no further questions.

## 2023-04-09 ENCOUNTER — Ambulatory Visit: Payer: Medicaid Other | Admitting: Podiatry

## 2023-04-12 ENCOUNTER — Other Ambulatory Visit: Payer: Medicaid Other | Admitting: Licensed Clinical Social Worker

## 2023-04-12 ENCOUNTER — Ambulatory Visit: Payer: Medicaid Other | Admitting: Licensed Clinical Social Worker

## 2023-04-12 NOTE — Patient Outreach (Signed)
  Medicaid Managed Care   Unsuccessful Attempt Note   04/12/2023 Name: Sarah Duffy MRN: [id number] DOB: 09-07-1971  Referred by: [name], MD Reason for referral : No chief complaint on file.   {MANAGED MEDICAID OUTREACH UNSUCCESSFUL:[id number]}   Follow Up Plan: {MANAGED MEDICAID FOLLOW UP PLAN:25041}    ***SIGNATURE

## 2023-04-13 ENCOUNTER — Other Ambulatory Visit: Payer: Medicaid Other | Admitting: *Deleted

## 2023-04-13 ENCOUNTER — Ambulatory Visit: Payer: Medicaid Other | Admitting: *Deleted

## 2023-04-13 ENCOUNTER — Encounter: Payer: Self-pay | Admitting: Gastroenterology

## 2023-04-13 ENCOUNTER — Encounter: Payer: Self-pay | Admitting: *Deleted

## 2023-04-13 NOTE — Patient Instructions (Signed)
Visit Information  Sarah Duffy was given information about Medicaid Managed Care team care coordination services as a part of their [facility] Community Plan Medicaid benefit. Sarah Duffy verbally consented to engagement with the [facility] Managed Care team.   If you are experiencing a medical emergency, please call 911 or report to your local emergency department or urgent care.   If you have a non-emergency medical problem during routine business hours, please contact your provider's office and ask to speak with a nurse.   For questions related to your [facility], please call: [phone] or visit the homepage here: [email or web address]  If you would like to schedule transportation through your [facility], please call the following number at least 2 days in advance of your appointment: [phone]   Rides for urgent appointments can also be made after hours by calling Member Services.  Call the Behavioral Health Crisis Line at [phone], at any time, 24 hours a day, 7 days a week. If you are in danger or need immediate medical attention call 911.  If you would like help to quit smoking, call 1-800-QUIT-NOW ((954)628-3817) OR Espaol: 1-855-Djelo-Ya (1-324-401-0272) o para ms informacin haga clic aqu or Text READY to 536-644 to register via text  Sarah Duffy,   Please see education materials related to DM provided by MyChart link.  Patient verbalizes understanding of instructions and care plan provided today and agrees to view in MyChart. Active MyChart status and patient understanding of how to access instructions and care plan via MyChart confirmed with patient.     Telephone follow up appointment with Managed Medicaid care management team member scheduled for:05/17/23 @ 9am  Sarah Emms RN, BSN [place]  Managed [facility] RN Care  Coordinator [phone]   Following is a copy of your plan of care:  Care Plan : RN Care Manager Plan of Care  Updates made by Heidi Dach, RN since 04/13/2023 12:00 AM     Problem: Health Management needs related to DMII      Long-Range Goal: Development of Plan of Care to address Health Management needs related to DMII   Start Date: 04/13/2023  Expected End Date: 07/12/2023  Note:   Current Barriers:  Chronic Disease Management support and education needs related to DMII  RNCM Clinical Goal(s):  Patient will verbalize understanding of plan for management of DMII as evidenced by patient reports take all medications exactly as prescribed and will call provider for medication related questions as evidenced by patient reports    attend all scheduled medical appointments: 04/14/23 for EGD as evidenced by provider documentation in EMR        continue to work with RN Care Manager and/or Social Worker to address care management and care coordination needs related to DMII as evidenced by adherence to CM Team Scheduled appointments     through collaboration with Medical illustrator, provider, and care team.   Interventions: Inter-disciplinary care team collaboration (see longitudinal plan of care) Evaluation of current treatment plan related to  self management and patient's adherence to plan as established by provider   Diabetes:  (Status: New goal.) Long Term Goal   Lab Results  Component Value Date   HGBA1C 8.6 (A) 03/04/2023   @ Assessed patient's understanding of A1c goal: <7% Provided education to patient about basic DM disease process; Reviewed prescribed diet with patient low carb/MyPlate method; Discussed plans with patient for ongoing care management follow up and provided patient with direct  contact information for care management team;      Reviewed scheduled/upcoming provider appointments including: EGD on 04/14/23;         Assessed social determinant of health barriers;         Provided patient with [facility] Member services [phone], call for member benefits such as Gym membership and home delivered meals after hospitalization Provided patient with [facility] medical transportation [phone], call at least 3 days before your appointment to arrange transportation to medical appointments  Patient Goals/Self-Care Activities: Take medications as prescribed   Attend all scheduled provider appointments Call provider office for new concerns or questions  drink 6 to 8 glasses of water each day fill half of plate with vegetables manage portion size

## 2023-04-13 NOTE — Patient Outreach (Signed)
Medicaid Managed Care   Nurse Care Manager Note  04/13/2023 Name:  Sarah Duffy MRN:  [id number] DOB:  [birth year]  Sarah Duffy is an 52 y.o. year old female who is a primary patient of Sarah Cory, MD.  The [facility] Managed Care Coordination team was consulted for assistance with:    DMII  Sarah Duffy was given information about Medicaid Managed Care Coordination team services today. [name] Patient agreed to services and verbal consent obtained.  Engaged with patient by telephone for initial visit in response to provider referral for case management and/or care coordination services.   Assessments/Interventions:  Review of past medical history, allergies, medications, health status, including review of consultants reports, laboratory and other test data, was performed as part of comprehensive evaluation and provision of chronic care management services.  SDOH (Social Determinants of Health) assessments and interventions performed: SDOH Interventions    Flowsheet Row Patient Outreach Telephone from 04/13/2023 in [facility] Patient Outreach Telephone from 04/05/2023 in [place] [facility] DEPARTMENT Office Visit from 04/29/2021 in [facility] Office Visit from 02/11/2021 in [facility]  SDOH Interventions      Housing Interventions -- -- Other (Comment)  Sarah Duffy got some information from pre-op] --  Transportation Interventions -- Intervention Not Indicated -- --  Utilities Interventions Intervention Not Indicated -- -- --  Depression Interventions/Treatment  -- -- -- Medication  Stress Interventions -- [employer], Provide Counseling -- --       Care Plan  No Known Allergies  Medications Reviewed Today     Reviewed by Heidi Dach, RN (Registered Nurse) on 04/13/23 at 1505  Med List Status: <None>   Medication Order Taking? Sig Documenting Provider Last  Dose Status Informant  atorvastatin (LIPITOR) 80 MG tablet [id number] Yes Take 1 tablet (80 mg total) by mouth daily. Sarah Cory, MD Taking Active   Blood Glucose Monitoring Suppl (ACCU-CHEK GUIDE) w/Device KIT [id number] Yes Use to check blood sugar 2 time daily as needed [name], [name], MD Taking Active   carvedilol (COREG) 12.5 MG tablet [id number] Yes Take 1 tablet (12.5 mg total) by mouth 2 (two) times daily. Creig Hines, NP Taking Active   Continuous Blood Gluc Sensor (DEXCOM G7 SENSOR) MISC [id number] Yes Use as directed. Apply 1 sensor every 10 days [provider] Taking Active   Dulaglutide (TRULICITY) 1.5 MG/0.5ML SOPN [id number] No Inject 1.5 mg into the skin once a week.  Patient not taking: Reported on 03/24/2023   [name], [name], MD Not Taking Active            Med Note (ISLEY, DAVA L   Tue Mar 23, 2023  8:22 AM) Has not starting  ENTRESTO 24-26 MG [id number] Yes Take 1 tablet by mouth 2 (two) times daily. Sarah Odea, MD Taking Active   glucose blood (ACCU-CHEK GUIDE) test strip [id number] Yes Check blood sugar 2 times daily as needed [name], [name], MD Taking Active   insulin aspart (NOVOLOG FLEXPEN) 100 UNIT/ML FlexPen [id number] Yes Max daily 30 units [name], [name], MD Taking Active   Insulin Glargine [facility]) [id number] Yes Inject 100 Units into the skin daily. [provider] Taking Active   insulin glargine-yfgn (SEMGLEE) 100 UNIT/ML Pen [id number] No INJECT 90 UNITS INTO THE SKIN DAILY  Patient not taking: Reported on 04/13/2023   [name], [name], MD Not Taking Active   Insulin Pen Needle 32G X 4 MM  MISC [id number] Yes 1 Device by Does not apply route in the morning, at noon, in the evening, and at bedtime. [name], [name], MD Taking Active   JARDIANCE 25 MG TABS tablet [id number] Yes Take 1 tablet (25 mg total) by mouth daily. [name], [name], MD  Taking Active   Lancets (ACCU-CHEK MULTICLIX) lancets [id number] Yes Check sugar 2 times daily as needed. [name], [name], MD Taking Active   nitroGLYCERIN (NITROSTAT) 0.4 MG SL tablet [id number] Yes PLACE 1 TABLET UNDER THE TONGUE EVERY 5 MINUTES AS NEEDED FOR CHEST PAIN. Sarah Odea, MD Taking Active   omeprazole (PRILOSEC) 40 MG capsule [id number] Yes TAKE 1 CAPSULE (40 MG TOTAL) BY MOUTH 2 (TWO) TIMES DAILY BEFORE A MEAL. Sarah Mood, MD Taking Active   Semaglutide,0.25 or 0.5MG /DOS, 2 MG/3ML Sarah Duffy [id number] No Inject 0.5 mg into the skin once a week.  Patient not taking: Reported on 04/13/2023   [name], [name], MD Not Taking Active   spironolactone (ALDACTONE) 25 MG tablet [id number] Yes Take 1 tablet (25 mg total) by mouth daily. Sarah Odea, MD Taking Active   sucralfate (CARAFATE) 1 GM/10ML suspension [id number] Yes Take 10 mLs (1 g total) by mouth 4 (four) times daily.  Patient taking differently: Take 1 g by mouth 4 (four) times daily as needed.   Sarah Mood, MD Taking Active   triamcinolone cream (KENALOG) 0.1 % [id number] Yes Apply 1 Application topically 2 (two) times daily. Sarah Cory, MD Taking Active             Patient Active Problem List   Diagnosis Date Noted   Dysphagia 03/23/2023   Asthma, well controlled 03/09/2023   Depression, major, in remission (HCC) 03/09/2023   Perennial allergic rhinitis with seasonal variation 03/09/2023   Type 2 diabetes mellitus with diabetic polyneuropathy, with long-term current use of insulin (HCC) 08/31/2022   Type 2 diabetes mellitus with retinopathy, with long-term current use of insulin (HCC) 08/31/2022   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(HCC) 08/31/2022   Hypertension 06/11/2022   Ischemic cardiomyopathy 06/11/2022   Coronary artery disease 06/11/2022   Chronic systolic heart failure (HCC) 06/11/2022   Angina pectoris associated with type 2 diabetes mellitus (HCC) 01/05/2022    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 edema associated with type 2 diabetes mellitus (HCC) 05/01/2021   Cardiomyopathy (HCC) 04/14/2021   Depression, major, recurrent, mild (HCC) 09/25/2019   History of 2019 novel coronavirus disease (COVID-19) 06/23/2019   Migraine headache without aura 01/14/2018   Type II diabetes mellitus (HCC) 05/22/2017   Diabetic retinopathy of left eye, with macular edema, with severe nonproliferative retinopathy, associated with type 2 diabetes mellitus 05/21/2016   Severe nonproliferative diabetic retinopathy of right eye without macular edema associated with type 2 diabetes mellitus (HCC) 05/21/2016   Hyperlipidemia LDL goal <70 03/10/2016   MI (mitral incompetence) 02/25/2016   Insomnia 02/25/2016   Anxiety 09/04/2014    Conditions to be addressed/monitored per PCP order:  DMII  Care Plan : RN Care Manager Plan of Care  Updates made by Heidi Dach, RN since 04/13/2023 12:00 AM     Problem: Health Management needs related to DMII      Long-Range Goal: Development of Plan of Care to address Health Management needs related to DMII   Start Date: 04/13/2023  Expected End Date: 07/12/2023  Note:   Current Barriers:  Chronic Disease Management support and education needs related to DMII  RNCM Clinical Goal(s):  Patient will verbalize understanding of  plan for management of DMII as evidenced by patient reports take all medications exactly as prescribed and will call provider for medication related questions as evidenced by patient reports    attend all scheduled medical appointments: 04/14/23 for EGD as evidenced by provider documentation in EMR        continue to work with RN Care Manager and/or Social Worker to address care management and care coordination needs related to DMII as evidenced by adherence to CM Team Scheduled appointments     through collaboration with Medical illustrator, provider, and care team.    Interventions: Inter-disciplinary care team collaboration (see longitudinal plan of care) Evaluation of current treatment plan related to  self management and patient's adherence to plan as established by provider   Diabetes:  (Status: New goal.) Long Term Goal   Lab Results  Component Value Date   HGBA1C 8.6 (A) 03/04/2023   @ Assessed patient's understanding of A1c goal: <7% Provided education to patient about basic DM disease process; Reviewed prescribed diet with patient low carb/MyPlate method; Discussed plans with patient for ongoing care management follow up and provided patient with direct contact information for care management team;      Reviewed scheduled/upcoming provider appointments including: EGD on 04/14/23;         Assessed social determinant of health barriers;        Provided patient with [facility] Member services [phone], call for member benefits such as Gym membership and home delivered meals after hospitalization Provided patient with [facility] medical transportation [phone], call at least 3 days before your appointment to arrange transportation to medical appointments  Patient Goals/Self-Care Activities: Take medications as prescribed   Attend all scheduled provider appointments Call provider office for new concerns or questions  drink 6 to 8 glasses of water each day fill half of plate with vegetables manage portion size       Follow Up:  Patient agrees to Care Plan and Follow-up.  Plan: The Managed Medicaid care management team will reach out to the patient again over the next 30 days.  Date/time of next scheduled RN care management/care coordination outreach:  05/17/23 @ 9am  Estanislado Emms RN, BSN [place]  Managed Medicaid RN Care Coordinator [phone]

## 2023-04-13 NOTE — Patient Instructions (Signed)
Trenita Lukas ,   The Medicaid Managed Care Team is available to provide assistance to you with your healthcare needs at no cost and as a benefit of your Medicaid Health plan. I'm sorry I was unable to reach you today for our scheduled appointment. Our care guide will call you to reschedule our telephone appointment. Please call me at the number below. I am available to be of assistance to you regarding your healthcare needs. .   Thank you,   Vrinda Heckstall, BSW, MSW, LCSW Managed Medicaid LCSW [place]  [facility] Deatra [email or web address] Phone: [phone]   

## 2023-04-14 ENCOUNTER — Ambulatory Visit: Admission: RE | Admit: 2023-04-14 | Payer: Medicaid Other | Source: Home / Self Care | Admitting: Gastroenterology

## 2023-04-14 ENCOUNTER — Encounter: Payer: Self-pay | Admitting: Anesthesiology

## 2023-04-14 ENCOUNTER — Encounter: Admission: RE | Payer: Self-pay | Source: Home / Self Care

## 2023-04-14 SURGERY — ESOPHAGOGASTRODUODENOSCOPY (EGD) WITH PROPOFOL
Anesthesia: General

## 2023-04-16 ENCOUNTER — Other Ambulatory Visit: Payer: Self-pay | Admitting: Family Medicine

## 2023-04-16 DIAGNOSIS — I2510 Atherosclerotic heart disease of native coronary artery without angina pectoris: Secondary | ICD-10-CM

## 2023-04-16 DIAGNOSIS — E1169 Type 2 diabetes mellitus with other specified complication: Secondary | ICD-10-CM

## 2023-04-19 DIAGNOSIS — I10 Essential (primary) hypertension: Secondary | ICD-10-CM | POA: Diagnosis not present

## 2023-04-20 ENCOUNTER — Telehealth: Payer: Self-pay

## 2023-04-20 NOTE — Telephone Encounter (Signed)
..   Medicaid Managed Care   Unsuccessful Outreach Note  04/20/2023 Name: Sarah Duffy MRN: [id number] DOB: 1971-12-08  Referred by: [name], MD Reason for referral : Appointment (I called the patient today to get her missed phone appointment with the MM LCSW rescheduled. I left my name and number on her voicemail.)   A second unsuccessful telephone outreach was attempted today. The patient was referred to the case management team for assistance with care management and care coordination.   Follow Up Plan: A HIPAA compliant phone message was left for the patient providing contact information and requesting a return call.  The care management team will reach out to the patient again over the next 7 days.   Weston Settle Care Guide  [facility] Managed  Care Guide [facility]  [phone]

## 2023-04-23 ENCOUNTER — Other Ambulatory Visit: Payer: Medicaid Other

## 2023-04-23 ENCOUNTER — Telehealth: Payer: Self-pay

## 2023-04-23 NOTE — Patient Instructions (Signed)
  Medicaid Managed Care   Unsuccessful Outreach Note  04/23/2023 Name: Sarah Duffy MRN: [id number] DOB: 1971-03-26  Referred by: [name], MD Reason for referral : High Risk Managed Medicaid (MM Social work telephone outreach )   An unsuccessful telephone outreach was attempted today. The patient was referred to the case management team for assistance with care management and care coordination.   Follow Up Plan: A HIPAA compliant phone message was left for the patient providing contact information and requesting a return call.   Abelino Derrick, MHA [facility]  Managed [facility] Social Worker [phone]

## 2023-04-23 NOTE — Telephone Encounter (Signed)
Called patient to let her know that Dr. Tobi Bastos wants to repeat her EGD since she still had food. Dr. Tobi Bastos wants her to be on a 24 hour clear liquid diet. Awaiting on patient's call to reschedule EGD.

## 2023-04-23 NOTE — Patient Outreach (Signed)
  Medicaid Managed Care   Unsuccessful Outreach Note  04/23/2023 Name: Sarah Duffy MRN: [id number] DOB: 01/23/1971  Referred by: [name], Krichna, MD Reason for referral : High Risk Managed Medicaid (MM Social work telephone outreach )   An unsuccessful telephone outreach was attempted today. The patient was referred to the case management team for assistance with care management and care coordination.   Follow Up Plan: A HIPAA compliant phone message was left for the patient providing contact information and requesting a return call.   Noelly Lasseigne, BSW, MHA [place]  Managed Medicaid Social Worker [phone]  

## 2023-04-26 ENCOUNTER — Telehealth: Payer: Self-pay

## 2023-04-26 NOTE — Telephone Encounter (Signed)
..   Medicaid Managed Care   Unsuccessful Outreach Note  04/26/2023 Name: Zora Kingry MRN: [id number] DOB: Nov 13, 1971  Referred by: [name], MD Reason for referral : Appointment (I called the patient today to reschedule her missed phone appointments with the MM BSW and LCSW. I had to leave a message on her VM.)   Third unsuccessful telephone outreach was attempted today. The patient was referred to the case management team for assistance with care management and care coordination. The patient's primary care provider has been notified of our unsuccessful attempts to make or maintain contact with the patient. The care management team is pleased to engage with this patient at any time in the future should he/she be interested in assistance from the care management team.   Follow Up Plan: We have been unable to make contact with the patient for follow up. The care management team is available to follow up with the patient after provider conversation with the patient regarding recommendation for care management engagement and subsequent re-referral to the care management team.   Weston Settle Care Guide  [facility] Managed  Care Guide [facility]  [phone]

## 2023-05-03 ENCOUNTER — Telehealth: Payer: Self-pay

## 2023-05-03 NOTE — Telephone Encounter (Signed)
..   Medicaid Managed Care   Unsuccessful Outreach Note  05/03/2023 Name: Sarah Duffy MRN: [id number] DOB: 09-05-1971  Referred by: [name], MD Reason for referral : Appointment   Third unsuccessful telephone outreach was attempted today. The patient was referred to the case management team for assistance with care management and care coordination. The patient's primary care provider has been notified of our unsuccessful attempts to make or maintain contact with the patient. The care management team is pleased to engage with this patient at any time in the future should he/she be interested in assistance from the care management team.   Follow Up Plan: We have been unable to make contact with the patient for follow up. The care management team is available to follow up with the patient after provider conversation with the patient regarding recommendation for care management engagement and subsequent re-referral to the care management team.   Weston Settle Care Guide  [facility] Managed  Care Guide [facility]  [phone]

## 2023-05-05 ENCOUNTER — Other Ambulatory Visit: Payer: Medicaid Other | Admitting: Pharmacist

## 2023-05-05 ENCOUNTER — Encounter: Payer: Self-pay | Admitting: Pharmacist

## 2023-05-05 NOTE — Patient Instructions (Signed)
Goals Addressed             This Visit's Progress    Pharmacy Goals       Our goal A1c is less than 7%. This corresponds with fasting sugars less than 130 and 2 hour after meal sugars less than 180.  Also, the following link is the handout with reminders for checking your blood pressure:   [email or web address]      Please let me know if you have any medication related questions/concerns.    Estelle Grumbles, PharmD, [facility] [phone]

## 2023-05-05 NOTE — Progress Notes (Signed)
05/05/2023 Name: [name] MRN: [id number] DOB: 09-26-1971  Chief Complaint  Patient presents with   Medication Management   Medication Adherence    Sarah Duffy is a 52 y.o. year old female who presented for a telephone visit.   They were referred to the pharmacist by a quality report for assistance in managing diabetes and medication access.    Patient is participating in a Managed Medicaid Plan:  Yes, [facility] Community Plan   Subjective:   Care Team: Primary Care Provider: [name], MD ; Next Scheduled Visit: 08/09/2023 Cardiologist: [name], MD/ Creig Hines, NP; Next Scheduled Visit: 06/03/2023 Cardiothoracic Surgery: Ruthell Rummage Endocrinologist [name], [name], MD; Next Scheduled Visit: 09/07/2023 Gastroenterologist: [name], MD Podiatry: Candelaria Stagers, DPM Managed Medicaid LCSW Managed Medicaid RN: Heidi Dach, RN; Next Scheduled Visit: 05/17/2023    Medication Access/Adherence  Current Pharmacy:  [id number] [place], [place] - [address] [place] [address] Phone: [phone] Fax: [phone]  [id number] [place], [place] - [address] [facility] [address] [place] [address] Phone: [phone] Fax: [phone]   Patient reports affordability concerns with their medications: No  Patient reports access/transportation concerns to their pharmacy: No  Patient reports adherence concerns with their medications:  No     Patient uses a weekly pillbox     Diabetes:   Patient follows with Dr. Lonzo Cloud at [facility]   Current medications:  Basaglar (insulin glargine) 90 units daily Novolog insulin - reports following sliding scale from Endocrinologist three times daily before breakfast, lunch and supper Jardiance 25 mg daily Ozempic 0.5 mg weekly on Mondays as prescribed by Endocrinologist (started on 5/13) Reports tolerating  well   Medications tried in the past: metformin, glipizide, Victoza   Patient using Dexcom G7 continuous glucose monitoring (CGM) Shares past 14-days of CGM data using Dexcom Clarity App today:       Reports had a low reading in 60s a couple of weeks ago and treated with glucose tablets, but did not follow up with a meal   Reports at her new part-time job she is not permitted to have her phone (CGM reader app) with her during work hours  Current physical activity: Stays active throughout the day working in a [employer]      Hypertension/Heart Failure:   Current medications:  ACEi/ARB/ARNI: Entresto 24-26 mg twice daily SGLT2i: Jardiance 25 mg daily Beta blocker: carvedilol 12.5 mg twice daily Mineralocorticoid Receptor Antagonist: spironolactone 25 mg daily     Denies having an upper arm blood pressure monitor Reports has an old wrist monitor, but denies recently monitoring home BP   Patient has not yet been able to obtain blood pressure monitor from [employer] [facility]    Denies symptoms of hypotension, such as dizziness or lightheadedness   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       Hyperlipidemia/ASCVD Risk Reduction   Current lipid lowering medications: atorvastatin 80 mg daily     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       Objective:  Lab Results  Component Value Date   HGBA1C 8.6 (A) 03/04/2023    Lab Results  Component Value Date   CREATININE 0.93 03/09/2023   BUN 18 03/09/2023   NA 140 03/09/2023   K 4.1  03/09/2023   CL 108 03/09/2023   CO2 24 03/09/2023    Lab Results  Component Value Date   CHOL 138 03/09/2023   HDL 45 (L) 03/09/2023   LDLCALC 79 03/09/2023   TRIG 64 03/09/2023   CHOLHDL 3.1 03/09/2023   BP Readings from Last 3 Encounters:  03/23/23 (!) 105/51  03/09/23 114/72  03/04/23 120/80   Pulse  Readings from Last 3 Encounters:  03/23/23 69  03/09/23 88  03/04/23 74     Medications Reviewed Today     Reviewed by Heidi Dach, RN (Registered Nurse) on 04/13/23 at 1505  Med List Status: <None>   Medication Order Taking? Sig Documenting Provider Last Dose Status Informant  atorvastatin (LIPITOR) 80 MG tablet [id number] Yes Take 1 tablet (80 mg total) by mouth daily. [name], MD Taking Active   Blood Glucose Monitoring Suppl (ACCU-CHEK GUIDE) w/Device KIT [id number] Yes Use to check blood sugar 2 time daily as needed [name], [name], MD Taking Active   carvedilol (COREG) 12.5 MG tablet [id number] Yes Take 1 tablet (12.5 mg total) by mouth 2 (two) times daily. Creig Hines, NP Taking Active   Continuous Blood Gluc Sensor (DEXCOM G7 SENSOR) MISC [id number] Yes Use as directed. Apply 1 sensor every 10 days [provider] Taking Active   Dulaglutide (TRULICITY) 1.5 MG/0.5ML SOPN [id number] No Inject 1.5 mg into the skin once a week.  Patient not taking: Reported on 03/24/2023   [name], [name], MD Not Taking Active            Med Note (ISLEY, DAVA L   Tue Mar 23, 2023  8:22 AM) Has not starting  ENTRESTO 24-26 MG [id number] Yes Take 1 tablet by mouth 2 (two) times daily. [name], MD Taking Active   glucose blood (ACCU-CHEK GUIDE) test strip [id number] Yes Check blood sugar 2 times daily as needed [name], [name], MD Taking Active   insulin aspart (NOVOLOG FLEXPEN) 100 UNIT/ML FlexPen [id number] Yes Max daily 30 units [name], [name], MD Taking Active   Insulin Glargine [facility]) [id number] Yes Inject 100 Units into the skin daily. [provider] Taking Active   insulin glargine-yfgn (SEMGLEE) 100 UNIT/ML Pen [id number] No INJECT 90 UNITS INTO THE SKIN DAILY  Patient not taking: Reported on 04/13/2023   [name], [name], MD Not Taking Active   Insulin Pen Needle 32G X 4 MM MISC  [id number] Yes 1 Device by Does not apply route in the morning, at noon, in the evening, and at bedtime. [name], [name], MD Taking Active   JARDIANCE 25 MG TABS tablet [id number] Yes Take 1 tablet (25 mg total) by mouth daily. [name], [name], MD Taking Active   Lancets (ACCU-CHEK MULTICLIX) lancets [id number] Yes Check sugar 2 times daily as needed. [name], [name], MD Taking Active   nitroGLYCERIN (NITROSTAT) 0.4 MG SL tablet [id number] Yes PLACE 1 TABLET UNDER THE TONGUE EVERY 5 MINUTES AS NEEDED FOR CHEST PAIN. [name], MD Taking Active   omeprazole (PRILOSEC) 40 MG capsule [id number] Yes TAKE 1 CAPSULE (40 MG TOTAL) BY MOUTH 2 (TWO) TIMES DAILY BEFORE A MEAL. [name], MD Taking Active   Semaglutide,0.25 or 0.5MG /DOS, 2 MG/3ML [name] [id number] No Inject 0.5 mg into the skin once a week.  Patient not taking: Reported on 04/13/2023   [name], [name], MD Not Taking Active   spironolactone (ALDACTONE) 25 MG tablet [id number] Yes Take 1 tablet (25 mg total) by mouth daily. Agbor-Etang,  Arlys John, MD Taking Active   sucralfate (CARAFATE) 1 GM/10ML suspension [id number] Yes Take 10 mLs (1 g total) by mouth 4 (four) times daily.  Patient taking differently: Take 1 g by mouth 4 (four) times daily as needed.   [name], MD Taking Active   triamcinolone cream (KENALOG) 0.1 % [id number] Yes Apply 1 Application topically 2 (two) times daily. [name], MD Taking Active               Assessment/Plan:   Patient to follow up again with [facility] regarding rescheduling of EGD procedure   Encourage patient to continue using weekly pillbox to aid with adherence     Diabetes: - Currently uncontrolled - Counsel patient on s/s of low blood sugar, how to treat lows and prevention of lows Counsel on importance of having regular well-balanced meals while controlling carbohydrate portion sizes. Advise patient against skipping meals Review rules  of 15s - importance of using 15 grams of sugar to treat low, recheck blood sugar in 15 minutes, treat again if remains low or, if back to normal, having meal if mealtime or snack  Review examples of sources of 15 grams of sugar Encourage patient to continue to carry glucose tablets with her Send patient handout on hypoglycemia via MyChart - Encourage patient to follow up with Endocrinology about scheduling sooner appointment for 4 month follow-up recommended by Endocrinologist  Patient will ask office/provider if possible to have a note for work to allow her to have CGM reader app (phone) on her at all times - Recommend to continue to monitor glucose using Dexcom G7 CGM, use CGM feedback and to contact Endocrinology office if needed for readings outside of established parameters, any episodes of hypoglycemia or other symptoms   Hypertension/Heart Failure: - Reviewed appropriate blood pressure monitoring technique and reviewed goal blood pressure.  - Collaborated with Cardiology and provider faxed a prescription for a upper arm blood pressure monitor to [facility] for patient on 4/11 Patient to follow up with [employer] (Phone # [phone]) again regarding new monitor - Send patient link to blood pressure monitoring handout and hypertension education via MyChart message - Encourage patient to monitor home blood pressure, keep log of results and have this record to review during upcoming appointments     Follow Up Plan: Clinical Pharmacist will follow up with patient by telephone on 08/04/2023 at 9:00 AM    Estelle Grumbles, PharmD, [facility] [phone]

## 2023-05-06 NOTE — Telephone Encounter (Signed)
Called patient again since I saw that she had read her MyChart message and I had to leave her a voicemail to call us back.

## 2023-05-11 ENCOUNTER — Encounter: Payer: 59 | Admitting: Family Medicine

## 2023-05-12 ENCOUNTER — Other Ambulatory Visit: Payer: Self-pay | Admitting: Family Medicine

## 2023-05-12 DIAGNOSIS — I2510 Atherosclerotic heart disease of native coronary artery without angina pectoris: Secondary | ICD-10-CM

## 2023-05-12 DIAGNOSIS — E785 Hyperlipidemia, unspecified: Secondary | ICD-10-CM

## 2023-05-12 DIAGNOSIS — J4520 Mild intermittent asthma, uncomplicated: Secondary | ICD-10-CM

## 2023-05-12 DIAGNOSIS — J302 Other seasonal allergic rhinitis: Secondary | ICD-10-CM

## 2023-05-17 ENCOUNTER — Encounter: Payer: Self-pay | Admitting: *Deleted

## 2023-05-17 ENCOUNTER — Other Ambulatory Visit: Payer: Medicaid Other | Admitting: *Deleted

## 2023-05-17 NOTE — Patient Instructions (Addendum)
Visit Information  [name] was given information about Medicaid Managed Care team care coordination services as a part of their [facility] Community Plan Medicaid benefit. Sarah Duffy verbally consented to engagement with the [facility] Managed Care team.   If you are experiencing a medical emergency, please call 911 or report to your local emergency department or urgent care.   If you have a non-emergency medical problem during routine business hours, please contact your provider's office and ask to speak with a nurse.   For questions related to your [facility], please call: [phone] or visit the homepage here: [email or web address]  If you would like to schedule transportation through your [place], please call the following number at least 2 days in advance of your appointment: [phone]   Rides for urgent appointments can also be made after hours by calling Member Services.  Call the Behavioral Health Crisis Line at [phone], at any time, 24 hours a day, 7 days a week. If you are in danger or need immediate medical attention call 911.  If you would like help to quit smoking, call 1-800-QUIT-NOW (980-522-5028) OR Espaol: 1-855-Djelo-Ya (4-403-474-2595) o para ms informacin haga clic aqu or Text READY to 638-756 to register via text  Sarah Duffy,   Please see education materials related to DM provided by MyChart link.  Patient verbalizes understanding of instructions and care plan provided today and agrees to view in MyChart. Active MyChart status and patient understanding of how to access instructions and care plan via MyChart confirmed with patient.     Telephone follow up appointment with Managed Medicaid care management team member scheduled for:07/19/23 @ 2:30 pm  Estanislado Emms RN, BSN [place]  Managed [facility] RN Care  Coordinator [phone]   Following is a copy of your plan of care:  Care Plan : RN Care Manager Plan of Care  Updates made by Heidi Dach, RN since 05/17/2023 12:00 AM     Problem: Health Management needs related to DMII      Long-Range Goal: Development of Plan of Care to address Health Management needs related to DMII   Start Date: 04/13/2023  Expected End Date: 07/12/2023  Note:   Current Barriers:  Chronic Disease Management support and education needs related to DMII-Sarah Duffy is using a pill organizer which has improved her compliance. She joined a gym and is trying to exercise 10 minutes every day. She will call today to reschedule EGD. Sarah Duffy is working to prepare herself and finances before she schedules CABG surgery.  RNCM Clinical Goal(s):  Patient will verbalize understanding of plan for management of DMII as evidenced by patient reports take all medications exactly as prescribed and will call provider for medication related questions as evidenced by patient reports    attend all scheduled medical appointments: Cardiology 06/03/23 as evidenced by provider documentation in EMR        continue to work with RN Care Manager and/or Social Worker to address care management and care coordination needs related to DMII as evidenced by adherence to CM Team Scheduled appointments     through collaboration with Medical illustrator, provider, and care team.   Interventions: Inter-disciplinary care team collaboration (see longitudinal plan of care) Evaluation of current treatment plan related to  self management and patient's adherence to plan as established by provider Rescheduled missed visits with BSW and LCSW   Diabetes:  (Status: Goal on Track (progressing): YES.) Long Term Goal  Lab Results  Component Value Date   HGBA1C 8.6 (A) 03/04/2023   @ Assessed patient's understanding of A1c goal: <7% Provided education to patient about basic DM disease process; Reviewed  prescribed diet with patient low carb/MyPlate method, provided examples of food choices; Discussed plans with patient for ongoing care management follow up and provided patient with direct contact information for care management team;      Reviewed scheduled/upcoming provider appointments including: Cardiology 06/03/23;         Assessed social determinant of health barriers;        Provided encouragement   Patient Goals/Self-Care Activities: Take medications as prescribed   Attend all scheduled provider appointments Call provider office for new concerns or questions  drink 6 to 8 glasses of water each day fill half of plate with vegetables manage portion size

## 2023-05-17 NOTE — Patient Outreach (Addendum)
Medicaid Managed Care   Nurse Care Manager Note  05/17/2023 Name:  Sarah Duffy MRN:  [id number] DOB:  26-Aug-1971  Sarah Duffy is an 52 y.o. year old female who is a primary patient of [name], MD.  The [facility] Managed Care Coordination team was consulted for assistance with:    DMII  Sarah Duffy was given information about Medicaid Managed Care Coordination team services today. Sarah Duffy Patient agreed to services and verbal consent obtained.  Engaged with patient by telephone for follow up visit in response to provider referral for case management and/or care coordination services.   Assessments/Interventions:  Review of past medical history, allergies, medications, health status, including review of consultants reports, laboratory and other test data, was performed as part of comprehensive evaluation and provision of chronic care management services.  SDOH (Social Determinants of Health) assessments and interventions performed: SDOH Interventions    Flowsheet Row Patient Outreach Telephone from 04/13/2023 in [place] [facility] Patient Outreach Telephone from 04/05/2023 in [place] POPULATION HEALTH DEPARTMENT Office Visit from 04/29/2021 in [facility] Office Visit from 02/11/2021 in [facility]  SDOH Interventions      Housing Interventions -- -- Other (Comment)  Sarah Duffy got some information from pre-op] --  Transportation Interventions -- Intervention Not Indicated -- --  Utilities Interventions Intervention Not Indicated -- -- --  Depression Interventions/Treatment  -- -- -- Medication  Stress Interventions -- [employer], Provide Counseling -- --       Care Plan  No Known Allergies  Medications Reviewed Today     Reviewed by Heidi Dach, RN (Registered Nurse) on 05/17/23 at 0915  Med List Status: <None>   Medication Order Taking? Sig Documenting Provider Last  Dose Status Informant  atorvastatin (LIPITOR) 80 MG tablet [id number] Yes TAKE 1 TABLET BY MOUTH EVERY DAY [name], Danna Hefty, MD Taking Active   Blood Glucose Monitoring Suppl (ACCU-CHEK GUIDE) w/Device KIT [id number] Yes Use to check blood sugar 2 time daily as needed [name], [name], MD Taking Active   carvedilol (COREG) 12.5 MG tablet [id number] Yes Take 1 tablet (12.5 mg total) by mouth 2 (two) times daily. Creig Hines, NP Taking Active   Continuous Blood Gluc Sensor (DEXCOM G7 SENSOR) MISC [id number] Yes Use as directed. Apply 1 sensor every 10 days [provider] Taking Active   ENTRESTO 24-26 MG [id number] Yes Take 1 tablet by mouth 2 (two) times daily. [name], MD Taking Active   glucose blood (ACCU-CHEK GUIDE) test strip [id number] Yes Check blood sugar 2 times daily as needed [name], [name], MD Taking Active   insulin aspart (NOVOLOG FLEXPEN) 100 UNIT/ML FlexPen [id number] Yes Max daily 30 units [name], [name], MD Taking Active   Insulin Glargine [facility]) [id number] Yes Inject 90 Units into the skin daily. [provider] Taking Active   Insulin Pen Needle 32G X 4 MM MISC [id number] Yes 1 Device by Does not apply route in the morning, at noon, in the evening, and at bedtime. [name], [name], MD Taking Active   JARDIANCE 25 MG TABS tablet [id number] Yes Take 1 tablet (25 mg total) by mouth daily. [name], [name], MD Taking Active   Lancets (ACCU-CHEK MULTICLIX) lancets [id number] Yes Check sugar 2 times daily as needed. [name], [name], MD Taking Active   nitroGLYCERIN (NITROSTAT) 0.4 MG SL tablet [id number] Yes PLACE 1 TABLET UNDER THE TONGUE EVERY 5 MINUTES AS NEEDED FOR CHEST  PAINDebbe Odea, MD Taking Active   omeprazole (PRILOSEC) 40 MG capsule [id number] Yes TAKE 1 CAPSULE (40 MG TOTAL) BY MOUTH 2 (TWO) TIMES DAILY BEFORE A MEAL. [name], MD Taking Active    Semaglutide,0.25 or 0.5MG /DOS, 2 MG/3ML [name] [id number] Yes Inject 0.5 mg into the skin once a week. [name], [name], MD Taking Active   spironolactone (ALDACTONE) 25 MG tablet [id number] Yes Take 1 tablet (25 mg total) by mouth daily. [name], MD Taking Active   sucralfate (CARAFATE) 1 GM/10ML suspension [id number] Yes Take 10 mLs (1 g total) by mouth 4 (four) times daily.  Patient taking differently: Take 1 g by mouth 4 (four) times daily as needed.   [name], MD Taking Active   triamcinolone cream (KENALOG) 0.1 % [id number] Yes Apply 1 Application topically 2 (two) times daily. [name], MD Taking Active             Patient Active Problem List   Diagnosis Date Noted   Dysphagia 03/23/2023   Asthma, well controlled 03/09/2023   Depression, major, in remission (HCC) 03/09/2023   Perennial allergic rhinitis with seasonal variation 03/09/2023   Type 2 diabetes mellitus with diabetic polyneuropathy, with long-term current use of insulin (HCC) 08/31/2022   Type 2 diabetes mellitus with retinopathy, with long-term current use of insulin (HCC) 08/31/2022   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(HCC) 08/31/2022   Hypertension 06/11/2022   Ischemic cardiomyopathy 06/11/2022   Coronary artery disease 06/11/2022   Chronic systolic heart failure (HCC) 06/11/2022   Angina pectoris associated with type 2 diabetes mellitus (HCC) 01/05/2022   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 edema associated with type 2 diabetes mellitus (HCC) 05/01/2021   Cardiomyopathy (HCC) 04/14/2021   Depression, major, recurrent, mild (HCC) 09/25/2019   History of 2019 novel coronavirus disease (COVID-19) 06/23/2019   Migraine headache without aura 01/14/2018   Type II diabetes mellitus (HCC) 05/22/2017   Diabetic retinopathy of left eye, with macular edema, with severe nonproliferative retinopathy, associated with type 2 diabetes mellitus 05/21/2016   Severe  nonproliferative diabetic retinopathy of right eye without macular edema associated with type 2 diabetes mellitus (HCC) 05/21/2016   Hyperlipidemia LDL goal <70 03/10/2016   MI (mitral incompetence) 02/25/2016   Insomnia 02/25/2016   Anxiety 09/04/2014    Conditions to be addressed/monitored per PCP order:  DMII  Care Plan : RN Care Manager Plan of Care  Updates made by Heidi Dach, RN since 05/17/2023 12:00 AM     Problem: Health Management needs related to DMII      Long-Range Goal: Development of Plan of Care to address Health Management needs related to DMII   Start Date: 04/13/2023  Expected End Date: 07/12/2023  Note:   Current Barriers:  Chronic Disease Management support and education needs related to DMII-Ms. Borders is using a pill organizer which has improved her compliance. She joined a gym and is trying to exercise 10 minutes every day. She will call today to reschedule EGD. [name] is working to prepare herself and finances before she schedules CABG surgery.  RNCM Clinical Goal(s):  Patient will verbalize understanding of plan for management of DMII as evidenced by patient reports take all medications exactly as prescribed and will call provider for medication related questions as evidenced by patient reports    attend all scheduled medical appointments: Cardiology 06/03/23 as evidenced by provider documentation in EMR        continue to work with RN Care Manager  and/or Child psychotherapist to address care management and care coordination needs related to DMII as evidenced by adherence to CM Team Scheduled appointments     through collaboration with Medical illustrator, provider, and care team.   Interventions: Inter-disciplinary care team collaboration (see longitudinal plan of care) Evaluation of current treatment plan related to  self management and patient's adherence to plan as established by provider Rescheduled missed visits with BSW and LCSW   Diabetes:  (Status: Goal  on Track (progressing): YES.) Long Term Goal   Lab Results  Component Value Date   HGBA1C 8.6 (A) 03/04/2023   @ Assessed patient's understanding of A1c goal: <7% Provided education to patient about basic DM disease process; Reviewed prescribed diet with patient low carb/MyPlate method, provided examples of food choices; Discussed plans with patient for ongoing care management follow up and provided patient with direct contact information for care management team;      Reviewed scheduled/upcoming provider appointments including: Cardiology 06/03/23;         Assessed social determinant of health barriers;        Provided encouragement   Patient Goals/Self-Care Activities: Take medications as prescribed   Attend all scheduled provider appointments Call provider office for new concerns or questions  drink 6 to 8 glasses of water each day fill half of plate with vegetables manage portion size       Follow Up:  Patient agrees to Care Plan and Follow-up.  Plan: The Managed Medicaid care management team will reach out to the patient again over the next 60 days.  Date/time of next scheduled RN care management/care coordination outreach:  07/19/23 @ 2:30 pm  Estanislado Emms RN, BSN [place]  Managed [facility] RN Care Coordinator [phone]

## 2023-05-18 ENCOUNTER — Telehealth: Payer: Self-pay

## 2023-05-18 ENCOUNTER — Encounter: Payer: Self-pay | Admitting: Family Medicine

## 2023-05-18 ENCOUNTER — Ambulatory Visit: Payer: Medicaid Other | Admitting: Family Medicine

## 2023-05-18 VITALS — BP 110/70 | HR 83 | Temp 98.0°F | Resp 14 | Ht 69.0 in | Wt 174.8 lb

## 2023-05-18 DIAGNOSIS — G43011 Migraine without aura, intractable, with status migrainosus: Secondary | ICD-10-CM | POA: Diagnosis not present

## 2023-05-18 MED ORDER — EMGALITY 120 MG/ML ~~LOC~~ SOAJ
120.0000 mg | SUBCUTANEOUS | 0 refills | Status: DC
Start: 2023-05-18 — End: 2023-07-27

## 2023-05-18 MED ORDER — ONDANSETRON HCL 4 MG PO TABS: 4.0000 mg | ORAL_TABLET | Freq: Three times a day (TID) | ORAL | 0 refills | Status: DC | PRN

## 2023-05-18 NOTE — Progress Notes (Signed)
Name: Sarah Duffy   MRN: [id number]    DOB: 1971-12-14   Date:05/18/2023       Progress Note  Subjective  Chief Complaint  Chief Complaint  Patient presents with   Migraine    HPI  Migraine headaches: she states symptoms were sporadic up to a couple of months ago. She states it started to be severe after she had a procedure on left eye for diabetic retinopathy. She states episodes are now about 3 times a week, usually only lasts a few hours but this episode started last night on right nuchal area and radiated to the right parietal area and this morning also on left frontal area radiating to her nuchal area. She tired tylenol without help. She has mild nausea but no vomiting. Denies neuro deficit.  Pain is now 7-8/10, described as aching and throbbing . Occasionally sharp on the left side   Patient Active Problem List   Diagnosis Date Noted   Dysphagia 03/23/2023   Asthma, well controlled 03/09/2023   Depression, major, in remission (HCC) 03/09/2023   Perennial allergic rhinitis with seasonal variation 03/09/2023   Type 2 diabetes mellitus with diabetic polyneuropathy, with long-term current use of insulin (HCC) 08/31/2022   Type 2 diabetes mellitus with retinopathy, with long-term current use of insulin (HCC) 08/31/2022   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(HCC) 08/31/2022   Hypertension 06/11/2022   Ischemic cardiomyopathy 06/11/2022   Coronary artery disease 06/11/2022   Chronic systolic heart failure (HCC) 06/11/2022   Angina pectoris associated with type 2 diabetes mellitus (HCC) 01/05/2022   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 edema associated with type 2 diabetes mellitus (HCC) 05/01/2021   Cardiomyopathy (HCC) 04/14/2021   Depression, major, recurrent, mild (HCC) 09/25/2019   History of 2019 novel coronavirus disease (COVID-19) 06/23/2019   Migraine headache without aura 01/14/2018   Type II diabetes mellitus (HCC) 05/22/2017   Diabetic  retinopathy of left eye, with macular edema, with severe nonproliferative retinopathy, associated with type 2 diabetes mellitus 05/21/2016   Severe nonproliferative diabetic retinopathy of right eye without macular edema associated with type 2 diabetes mellitus (HCC) 05/21/2016   Hyperlipidemia LDL goal <70 03/10/2016   MI (mitral incompetence) 02/25/2016   Insomnia 02/25/2016   Anxiety 09/04/2014    Past Surgical History:  Procedure Laterality Date   CHOLECYSTECTOMY  1999   COLONOSCOPY WITH PROPOFOL N/A 03/25/2021   Procedure: COLONOSCOPY WITH PROPOFOL;  Surgeon: [name], MD;  Location: [facility] ENDOSCOPY;  Service: Gastroenterology;  Laterality: N/A;   COLONOSCOPY WITH PROPOFOL N/A 04/17/2021   Procedure: COLONOSCOPY WITH PROPOFOL;  Surgeon: [name], MD;  Location: [facility] ENDOSCOPY;  Service: Gastroenterology;  Laterality: N/A;   ESOPHAGOGASTRODUODENOSCOPY (EGD) WITH PROPOFOL N/A 03/25/2021   Procedure: ESOPHAGOGASTRODUODENOSCOPY (EGD) WITH PROPOFOL;  Surgeon: [name], MD;  Location: [facility] ENDOSCOPY;  Service: Gastroenterology;  Laterality: N/A;   ESOPHAGOGASTRODUODENOSCOPY (EGD) WITH PROPOFOL N/A 04/17/2021   Procedure: ESOPHAGOGASTRODUODENOSCOPY (EGD) WITH PROPOFOL;  Surgeon: [name], MD;  Location: [facility] ENDOSCOPY;  Service: Gastroenterology;  Laterality: N/A;   ESOPHAGOGASTRODUODENOSCOPY (EGD) WITH PROPOFOL N/A 03/23/2023   Procedure: ESOPHAGOGASTRODUODENOSCOPY (EGD) WITH PROPOFOL;  Surgeon: [name], MD;  Location: [facility] ENDOSCOPY;  Service: Gastroenterology;  Laterality: N/A;   RIGHT/LEFT HEART CATH AND CORONARY ANGIOGRAPHY Bilateral 04/14/2021   Procedure: RIGHT/LEFT HEART CATH AND CORONARY ANGIOGRAPHY;  Surgeon: Yvonne Kendall, MD;  Location: [facility] INVASIVE CV LAB;  Service: Cardiovascular;  Laterality: Bilateral;   TOE SURGERY Left 02/07/2016   Pinky Toe    Family History  Problem Relation Age of Onset   Diabetes Mother    Ulcers Mother    Heart disease Father    AAA  (abdominal aortic aneurysm) Father    Diabetes Father    Hypertension Father    Stroke Father    Alzheimer's disease Father    Heart attack Sister    Seizures Brother    Diabetes Maternal Grandmother    Breast cancer Maternal Grandmother     Social History   Tobacco Use   Smoking status: Never   Smokeless tobacco: Never  Substance Use Topics   Alcohol use: No    Alcohol/week: 0.0 standard drinks of alcohol     Current Outpatient Medications:    albuterol (VENTOLIN HFA) 108 (90 Base) MCG/ACT inhaler, TAKE 2 PUFFS BY MOUTH EVERY 6 HOURS AS NEEDED FOR WHEEZE OR SHORTNESS OF BREATH, Disp: , Rfl:    amLODipine (NORVASC) 5 MG tablet, Take 5 mg by mouth daily., Disp: , Rfl:    atorvastatin (LIPITOR) 80 MG tablet, TAKE 1 TABLET BY MOUTH EVERY DAY, Disp: 90 tablet, Rfl: 0   Blood Glucose Monitoring Suppl (ACCU-CHEK GUIDE) w/Device KIT, Use to check blood sugar 2 time daily as needed, Disp: 1 kit, Rfl: 0   carvedilol (COREG) 12.5 MG tablet, Take 1 tablet (12.5 mg total) by mouth 2 (two) times daily., Disp: 180 tablet, Rfl: 3   Continuous Blood Gluc Sensor (DEXCOM G7 SENSOR) MISC, Use as directed. Apply 1 sensor every 10 days, Disp: , Rfl:    ENTRESTO 24-26 MG, Take 1 tablet by mouth 2 (two) times daily., Disp: 28 tablet, Rfl: 0   ezetimibe (ZETIA) 10 MG tablet, Take 10 mg by mouth daily., Disp: , Rfl:    Galcanezumab-gnlm (EMGALITY) 120 MG/ML SOAJ, Inject 120-240 mg into the skin every 30 (thirty) days., Disp: 2 mL, Rfl: 0   glucose blood (ACCU-CHEK GUIDE) test strip, Check blood sugar 2 times daily as needed, Disp: 100 each, Rfl: 12   insulin aspart (NOVOLOG FLEXPEN) 100 UNIT/ML FlexPen, Max daily 30 units, Disp: 30 mL, Rfl: 3   Insulin Glargine (BASAGLAR KWIKPEN Ross), Inject 90 Units into the skin daily., Disp: , Rfl:    Insulin Pen Needle 32G X 4 MM MISC, 1 Device by Does not apply route in the morning, at noon, in the evening, and at bedtime., Disp: 400 each, Rfl: 3   JARDIANCE 25 MG  TABS tablet, Take 1 tablet (25 mg total) by mouth daily., Disp: 90 tablet, Rfl: 2   Lancets (ACCU-CHEK MULTICLIX) lancets, Check sugar 2 times daily as needed., Disp: 100 each, Rfl: 12   nitroGLYCERIN (NITROSTAT) 0.4 MG SL tablet, PLACE 1 TABLET UNDER THE TONGUE EVERY 5 MINUTES AS NEEDED FOR CHEST PAIN., Disp: 25 tablet, Rfl: 3   omeprazole (PRILOSEC) 40 MG capsule, TAKE 1 CAPSULE (40 MG TOTAL) BY MOUTH 2 (TWO) TIMES DAILY BEFORE A MEAL., Disp: 180 capsule, Rfl: 1   Semaglutide,0.25 or 0.5MG /DOS, 2 MG/3ML SOPN, Inject 0.5 mg into the skin once a week., Disp: 3 mL, Rfl: 6   spironolactone (ALDACTONE) 25 MG tablet, Take 1 tablet (25 mg total) by mouth daily., Disp: 90 tablet, Rfl: 3   sucralfate (CARAFATE) 1 GM/10ML suspension, Take 10 mLs (1 g total) by mouth 4 (four) times daily. (Patient taking differently: Take 1 g by mouth 4 (four) times daily as needed.), Disp: 420 mL, Rfl: 1   triamcinolone cream (KENALOG) 0.1 %, Apply 1 Application topically 2 (two) times daily., Disp: 453.6 g, Rfl:  0  No Known Allergies  I personally reviewed active problem list, medication list, allergies, family history with the patient/caregiver today.   ROS  Ten systems reviewed and is negative except as mentioned in HPI   Objective  Vitals:   05/18/23 0950  BP: 110/70  Pulse: 83  Resp: 14  Temp: 98 F (36.7 C)  TempSrc: Oral  SpO2: 99%  Weight: 174 lb 12.8 oz (79.3 kg)  Height: 5\' 9"  (1.753 m)    Body mass index is 25.81 kg/m.  Physical Exam  Constitutional: Patient appears well-developed and well-nourished. Obese  No distress.  HEENT: head atraumatic, normocephalic, pupils equal and reactive to light, neck supple Cardiovascular: Normal rate, regular rhythm and normal heart sounds.  No murmur heard. No BLE edema. Pulmonary/Chest: Effort normal and breath sounds normal. No respiratory distress. Abdominal: Soft.  There is no tenderness. Neurological: no focal deficit  Psychiatric: Patient has a  normal mood and affect. behavior is normal. Judgment and thought content normal.   Recent Results (from the past 2160 hour(s))  Novel Coronavirus, NAA ([facility])     Status: None   Collection Time: 02/26/23 12:00 AM   Specimen: Nasopharyngeal(NP) swabs in vial transport medium   Nasopharynge  Previous  Result Value Ref Range   SARS-CoV-2, NAA Not Detected Not Detected    Comment: This nucleic acid amplification test was developed and its performance characteristics determined by [employer]. Nucleic acid amplification tests include RT-PCR and TMA. This test has not been FDA cleared or approved. This test has been authorized by FDA under an Emergency Use Authorization (EUA). This test is only authorized for the duration of time the declaration that circumstances exist justifying the authorization of the emergency use of in vitro diagnostic tests for detection of SARS-CoV-2 virus and/or diagnosis of COVID-19 infection under section 564(b)(1) of the Act, 21 U.S.C. 409WJX-9(J) (1), unless the authorization is terminated or revoked sooner. When diagnostic testing is negative, the possibility of a false negative result should be considered in the context of a patient's recent exposures and the presence of clinical signs and symptoms consistent with COVID-19. An individual without symptoms of COVID-19 and who is not shedding SARS-CoV-2 virus wo uld expect to have a negative (not detected) result in this assay.   POCT Influenza A/B     Status: None   Collection Time: 02/26/23  2:57 PM  Result Value Ref Range   Influenza A, POC Negative Negative   Influenza B, POC Negative Negative  Basic metabolic panel     Status: Abnormal   Collection Time: 03/03/23 11:05 AM  Result Value Ref Range   Sodium 136 135 - 145 mmol/L   Potassium 4.3 3.5 - 5.1 mmol/L   Chloride 109 98 - 111 mmol/L   CO2 21 (L) 22 - 32 mmol/L   Glucose, Bld 237 (H) 70 - 99 mg/dL    Comment: Glucose reference range  applies only to samples taken after fasting for at least 8 hours.   BUN 25 (H) 6 - 20 mg/dL   Creatinine, Ser 4.78 (H) 0.44 - 1.00 mg/dL   Calcium 8.7 (L) 8.9 - 10.3 mg/dL   GFR, Estimated >29 >56 mL/min    Comment: (NOTE) Calculated using the CKD-EPI Creatinine Equation (2021)    Anion gap 6 5 - 15    Comment: Performed at [facility], [address] [place], [place] [address]  POCT glycosylated hemoglobin (Hb A1C)     Status: Abnormal   Collection Time:  03/04/23  9:00 AM  Result Value Ref Range   Hemoglobin A1C 8.6 (A) 4.0 - 5.6 %   HbA1c POC (<> result, manual entry)     HbA1c, POC (prediabetic range)     HbA1c, POC (controlled diabetic range)    Lipid panel     Status: Abnormal   Collection Time: 03/09/23  1:55 PM  Result Value Ref Range   Cholesterol 138 <200 mg/dL   HDL 45 (L) > OR = 50 mg/dL   Triglycerides 64 <161 mg/dL   LDL Cholesterol (Calc) 79 mg/dL (calc)    Comment: Reference range: <100 . Desirable range <100 mg/dL for primary prevention;   <70 mg/dL for patients with CHD or diabetic patients  with > or = 2 CHD risk factors. Marland Kitchen LDL-C is now calculated using the Martin-Hopkins  calculation, which is a validated novel method providing  better accuracy than the Friedewald equation in the  estimation of LDL-C.  Horald Pollen et al. Lenox Ahr. 0960;454(09): 2061-2068  (http://education.[facility].com/faq/FAQ164)    Total CHOL/HDL Ratio 3.1 <5.0 (calc)   Non-HDL Cholesterol (Calc) 93 <811 mg/dL (calc)    Comment: For patients with diabetes plus 1 major ASCVD risk  factor, treating to a non-HDL-C goal of <100 mg/dL  (LDL-C of <91 mg/dL) is considered a therapeutic  option.   Microalbumin / creatinine urine ratio     Status: Abnormal   Collection Time: 03/09/23  1:55 PM  Result Value Ref Range   Creatinine, Urine 72 20 - 275 mg/dL   Microalb, Ur 4.8 mg/dL    Comment: Reference Range Not established    Microalb Creat Ratio 67 (H) <30 mg/g creat     Comment: . The ADA defines abnormalities in albumin excretion as follows: Marland Kitchen Albuminuria Category        Result (mg/g creatinine) . Normal to Mildly increased   <30 Moderately increased         30-299  Severely increased           > OR = 300 . The ADA recommends that at least two of three specimens collected within a 3-6 month period be abnormal before considering a patient to be within a diagnostic category.   COMPLETE METABOLIC PANEL WITH GFR     Status: Abnormal   Collection Time: 03/09/23  1:55 PM  Result Value Ref Range   Glucose, Bld 124 (H) 65 - 99 mg/dL    Comment: .            Fasting reference interval . For someone without known diabetes, a glucose value between 100 and 125 mg/dL is consistent with prediabetes and should be confirmed with a follow-up test. .    BUN 18 7 - 25 mg/dL   Creat 4.78 2.95 - 6.21 mg/dL   eGFR 73 > OR = 60 HY/QMV/7.84O9   BUN/Creatinine Ratio SEE NOTE: 6 - 22 (calc)    Comment:    Not Reported: BUN and Creatinine are within    reference range. .    Sodium 140 135 - 146 mmol/L   Potassium 4.1 3.5 - 5.3 mmol/L   Chloride 108 98 - 110 mmol/L   CO2 24 20 - 32 mmol/L   Calcium 9.1 8.6 - 10.4 mg/dL   Total Protein 6.9 6.1 - 8.1 g/dL   Albumin 4.3 3.6 - 5.1 g/dL   Globulin 2.6 1.9 - 3.7 g/dL (calc)   AG Ratio 1.7 1.0 - 2.5 (calc)   Total Bilirubin 1.2 0.2 - 1.2  mg/dL   Alkaline phosphatase (APISO) 77 37 - 153 U/L   AST 15 10 - 35 U/L   ALT 18 6 - 29 U/L  Pregnancy, urine POC     Status: None   Collection Time: 03/23/23  8:29 AM  Result Value Ref Range   Preg Test, Ur NEGATIVE NEGATIVE    Comment:        THE SENSITIVITY OF THIS METHODOLOGY IS >24 mIU/mL      PHQ2/9:    05/18/2023    9:59 AM 03/09/2023    1:12 PM 02/26/2023    2:57 PM 11/30/2022    1:36 PM 10/29/2022    9:58 AM  Depression screen PHQ 2/9  Decreased Interest 0 0 0 0 0  Down, Depressed, Hopeless 0 0 0 0 0  PHQ - 2 Score 0 0 0 0 0  Altered sleeping 0 0  0 0   Tired, decreased energy 0 0  0 0  Change in appetite 0 0  0 0  Feeling bad or failure about yourself  0 0  0 0  Trouble concentrating 0 0  0 0  Moving slowly or fidgety/restless 0 0  0 0  Suicidal thoughts 0 0  0 0  PHQ-9 Score 0 0  0 0  Difficult doing work/chores    Not difficult at all     phq 9 is negative    Assessment & Plan    1. Intractable migraine without aura and with status migrainosus  - Galcanezumab-gnlm (EMGALITY) 120 MG/ML SOAJ; Inject 120-240 mg into the skin every 30 (thirty) days.  Dispense: 2 mL; Refill: 0   She has CAD and cannot take triptans.   We will need to do a PA for approval    - ondansetron (ZOFRAN) 4 MG tablet; Take 1 tablet (4 mg total) by mouth every 8 (eight) hours as needed for nausea or vomiting.  Dispense: 20 tablet; Refill: 0

## 2023-05-18 NOTE — Telephone Encounter (Signed)
Spoke with Pharmacist and they are having computer issues and hope to be able to have this filled for her tomorrow.

## 2023-05-18 NOTE — Telephone Encounter (Signed)
PA initiated and approved for [employer] Patient notified.

## 2023-05-18 NOTE — Telephone Encounter (Signed)
Pt is coming in on 05/19/23     Copied from CRM #[id number]. Topic: Appointment Scheduling - Scheduling Inquiry for Clinic >> May 18, 2023  3:42 PM Carrielelia G wrote: Patient stated she was in the office earlier today and Doctor [name] offered to give her a shot for a migraine, but she drove, since the pharmacy is delayed is it possible for me to come back up today and get the shot. I do have a driver.

## 2023-05-19 ENCOUNTER — Encounter: Payer: Self-pay | Admitting: Family Medicine

## 2023-05-19 ENCOUNTER — Ambulatory Visit (INDEPENDENT_AMBULATORY_CARE_PROVIDER_SITE_OTHER): Payer: Medicaid Other | Admitting: Family Medicine

## 2023-05-19 VITALS — BP 112/70 | HR 83 | Temp 97.8°F | Resp 16 | Ht 69.0 in | Wt 174.8 lb

## 2023-05-19 DIAGNOSIS — G43011 Migraine without aura, intractable, with status migrainosus: Secondary | ICD-10-CM | POA: Diagnosis not present

## 2023-05-19 MED ORDER — PROMETHAZINE HCL 25 MG/ML IJ SOLN
25.0000 mg | Freq: Once | INTRAMUSCULAR | Status: AC
Start: 2023-05-19 — End: 2023-05-19
  Administered 2023-05-19: 25 mg via INTRAMUSCULAR

## 2023-05-19 MED ORDER — KETOROLAC TROMETHAMINE 60 MG/2ML IM SOLN
60.0000 mg | Freq: Once | INTRAMUSCULAR | Status: AC
Start: 2023-05-19 — End: 2023-05-19
  Administered 2023-05-19: 60 mg via INTRAMUSCULAR

## 2023-05-19 NOTE — Progress Notes (Signed)
Name: Sarah Duffy   MRN: [id number]    DOB: 11-01-1971   Date:05/19/2023       Progress Note  Subjective  Chief Complaint  Chief Complaint  Patient presents with   Migraine    HPI  Return visit for migraine pain management: she was seen yesterday but not able to get the 240 mg dose of Emgality, she came in with headache of 5/10 today, feeling better but does not feel she can go to work , her niece brought her in so she can get a shot.   We taught her how to use Emgality and gave her toradol with promethazine, she will take the rest of today off and resume work tomorrow  Patient Active Problem List   Diagnosis Date Noted   Dysphagia 03/23/2023   Asthma, well controlled 03/09/2023   Depression, major, in remission (HCC) 03/09/2023   Perennial allergic rhinitis with seasonal variation 03/09/2023   Type 2 diabetes mellitus with diabetic polyneuropathy, with long-term current use of insulin (HCC) 08/31/2022   Type 2 diabetes mellitus with retinopathy, with long-term current use of insulin (HCC) 08/31/2022   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(HCC) 08/31/2022   Hypertension 06/11/2022   Ischemic cardiomyopathy 06/11/2022   Coronary artery disease 06/11/2022   Chronic systolic heart failure (HCC) 06/11/2022   Angina pectoris associated with type 2 diabetes mellitus (HCC) 01/05/2022   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 edema associated with type 2 diabetes mellitus (HCC) 05/01/2021   Cardiomyopathy (HCC) 04/14/2021   Depression, major, recurrent, mild (HCC) 09/25/2019   History of 2019 novel coronavirus disease (COVID-19) 06/23/2019   Migraine headache without aura 01/14/2018   Type II diabetes mellitus (HCC) 05/22/2017   Diabetic retinopathy of left eye, with macular edema, with severe nonproliferative retinopathy, associated with type 2 diabetes mellitus 05/21/2016   Severe nonproliferative diabetic retinopathy of right eye without macular edema  associated with type 2 diabetes mellitus (HCC) 05/21/2016   Hyperlipidemia LDL goal <70 03/10/2016   MI (mitral incompetence) 02/25/2016   Insomnia 02/25/2016   Anxiety 09/04/2014    Past Surgical History:  Procedure Laterality Date   CHOLECYSTECTOMY  1999   COLONOSCOPY WITH PROPOFOL N/A 03/25/2021   Procedure: COLONOSCOPY WITH PROPOFOL;  Surgeon: [name], MD;  Location: [facility] ENDOSCOPY;  Service: Gastroenterology;  Laterality: N/A;   COLONOSCOPY WITH PROPOFOL N/A 04/17/2021   Procedure: COLONOSCOPY WITH PROPOFOL;  Surgeon: [name], MD;  Location: [facility] ENDOSCOPY;  Service: Gastroenterology;  Laterality: N/A;   ESOPHAGOGASTRODUODENOSCOPY (EGD) WITH PROPOFOL N/A 03/25/2021   Procedure: ESOPHAGOGASTRODUODENOSCOPY (EGD) WITH PROPOFOL;  Surgeon: [name], MD;  Location: [facility] ENDOSCOPY;  Service: Gastroenterology;  Laterality: N/A;   ESOPHAGOGASTRODUODENOSCOPY (EGD) WITH PROPOFOL N/A 04/17/2021   Procedure: ESOPHAGOGASTRODUODENOSCOPY (EGD) WITH PROPOFOL;  Surgeon: [name], MD;  Location: [facility] ENDOSCOPY;  Service: Gastroenterology;  Laterality: N/A;   ESOPHAGOGASTRODUODENOSCOPY (EGD) WITH PROPOFOL N/A 03/23/2023   Procedure: ESOPHAGOGASTRODUODENOSCOPY (EGD) WITH PROPOFOL;  Surgeon: [name], MD;  Location: [facility] ENDOSCOPY;  Service: Gastroenterology;  Laterality: N/A;   RIGHT/LEFT HEART CATH AND CORONARY ANGIOGRAPHY Bilateral 04/14/2021   Procedure: RIGHT/LEFT HEART CATH AND CORONARY ANGIOGRAPHY;  Surgeon: Yvonne Kendall, MD;  Location: [facility] INVASIVE CV LAB;  Service: Cardiovascular;  Laterality: Bilateral;   TOE SURGERY Left 02/07/2016   Pinky Toe    Family History  Problem Relation Age of Onset   Diabetes Mother    Ulcers Mother    Heart disease Father    AAA (abdominal aortic aneurysm) Father    Diabetes  Father    Hypertension Father    Stroke Father    Alzheimer's disease Father    Heart attack Sister    Seizures Brother    Diabetes Maternal Grandmother    Breast cancer  Maternal Grandmother     Social History   Tobacco Use   Smoking status: Never   Smokeless tobacco: Never  Substance Use Topics   Alcohol use: No    Alcohol/week: 0.0 standard drinks of alcohol     Current Outpatient Medications:    albuterol (VENTOLIN HFA) 108 (90 Base) MCG/ACT inhaler, TAKE 2 PUFFS BY MOUTH EVERY 6 HOURS AS NEEDED FOR WHEEZE OR SHORTNESS OF BREATH, Disp: , Rfl:    amLODipine (NORVASC) 5 MG tablet, Take 5 mg by mouth daily., Disp: , Rfl:    atorvastatin (LIPITOR) 80 MG tablet, TAKE 1 TABLET BY MOUTH EVERY DAY, Disp: 90 tablet, Rfl: 0   Blood Glucose Monitoring Suppl (ACCU-CHEK GUIDE) w/Device KIT, Use to check blood sugar 2 time daily as needed, Disp: 1 kit, Rfl: 0   carvedilol (COREG) 12.5 MG tablet, Take 1 tablet (12.5 mg total) by mouth 2 (two) times daily., Disp: 180 tablet, Rfl: 3   Continuous Blood Gluc Sensor (DEXCOM G7 SENSOR) MISC, Use as directed. Apply 1 sensor every 10 days, Disp: , Rfl:    ENTRESTO 24-26 MG, Take 1 tablet by mouth 2 (two) times daily., Disp: 28 tablet, Rfl: 0   ezetimibe (ZETIA) 10 MG tablet, Take 10 mg by mouth daily., Disp: , Rfl:    Galcanezumab-gnlm (EMGALITY) 120 MG/ML SOAJ, Inject 120-240 mg into the skin every 30 (thirty) days., Disp: 2 mL, Rfl: 0   glucose blood (ACCU-CHEK GUIDE) test strip, Check blood sugar 2 times daily as needed, Disp: 100 each, Rfl: 12   insulin aspart (NOVOLOG FLEXPEN) 100 UNIT/ML FlexPen, Max daily 30 units, Disp: 30 mL, Rfl: 3   Insulin Glargine (BASAGLAR KWIKPEN [place]), Inject 90 Units into the skin daily., Disp: , Rfl:    Insulin Pen Needle 32G X 4 MM MISC, 1 Device by Does not apply route in the morning, at noon, in the evening, and at bedtime., Disp: 400 each, Rfl: 3   JARDIANCE 25 MG TABS tablet, Take 1 tablet (25 mg total) by mouth daily., Disp: 90 tablet, Rfl: 2   Lancets (ACCU-CHEK MULTICLIX) lancets, Check sugar 2 times daily as needed., Disp: 100 each, Rfl: 12   nitroGLYCERIN (NITROSTAT) 0.4 MG SL  tablet, PLACE 1 TABLET UNDER THE TONGUE EVERY 5 MINUTES AS NEEDED FOR CHEST PAIN., Disp: 25 tablet, Rfl: 3   omeprazole (PRILOSEC) 40 MG capsule, TAKE 1 CAPSULE (40 MG TOTAL) BY MOUTH 2 (TWO) TIMES DAILY BEFORE A MEAL., Disp: 180 capsule, Rfl: 1   ondansetron (ZOFRAN) 4 MG tablet, Take 1 tablet (4 mg total) by mouth every 8 (eight) hours as needed for nausea or vomiting., Disp: 20 tablet, Rfl: 0   Semaglutide,0.25 or 0.5MG /DOS, 2 MG/3ML SOPN, Inject 0.5 mg into the skin once a week., Disp: 3 mL, Rfl: 6   spironolactone (ALDACTONE) 25 MG tablet, Take 1 tablet (25 mg total) by mouth daily., Disp: 90 tablet, Rfl: 3   sucralfate (CARAFATE) 1 GM/10ML suspension, Take 10 mLs (1 g total) by mouth 4 (four) times daily. (Patient taking differently: Take 1 g by mouth 4 (four) times daily as needed.), Disp: 420 mL, Rfl: 1   triamcinolone cream (KENALOG) 0.1 %, Apply 1 Application topically 2 (two) times daily., Disp: 453.6 g, Rfl: 0  No Known Allergies  I personally reviewed active problem list, medication list, allergies, family history with the patient/caregiver today.   ROS  Ten systems reviewed and is negative except as mentioned in HPI   Objective  Vitals:   05/19/23 0825  BP: 112/70  Pulse: 83  Resp: 16  Temp: 97.8 F (36.6 C)  TempSrc: Oral  SpO2: 98%  Weight: 174 lb 13.2 oz (79.3 kg)  Height: 5\' 9"  (1.753 m)    Body mass index is 25.82 kg/m.  Physical Exam  Constitutional: Patient appears well-developed and well-nourished. Obese  No distress.  HEENT: head atraumatic, normocephalic, pupils equal and reactive to light, neck supple Cardiovascular: Normal rate, regular rhythm and normal heart sounds.  No murmur heard. No BLE edema. Pulmonary/Chest: Effort normal and breath sounds normal. No respiratory distress. Abdominal: Soft.  There is no tenderness. Neuro: normal exam  Psychiatric: Patient has a normal mood and affect. behavior is normal. Judgment and thought content normal.    Recent Results (from the past 2160 hour(s))  Novel Coronavirus, NAA ([facility])     Status: None   Collection Time: 02/26/23 12:00 AM   Specimen: Nasopharyngeal(NP) swabs in vial transport medium   Nasopharynge  Previous  Result Value Ref Range   SARS-CoV-2, NAA Not Detected Not Detected    Comment: This nucleic acid amplification test was developed and its performance characteristics determined by [employer]. Nucleic acid amplification tests include RT-PCR and TMA. This test has not been FDA cleared or approved. This test has been authorized by FDA under an Emergency Use Authorization (EUA). This test is only authorized for the duration of time the declaration that circumstances exist justifying the authorization of the emergency use of in vitro diagnostic tests for detection of SARS-CoV-2 virus and/or diagnosis of COVID-19 infection under section 564(b)(1) of the Act, 21 U.S.C. 027OZD-6(U) (1), unless the authorization is terminated or revoked sooner. When diagnostic testing is negative, the possibility of a false negative result should be considered in the context of a patient's recent exposures and the presence of clinical signs and symptoms consistent with COVID-19. An individual without symptoms of COVID-19 and who is not shedding SARS-CoV-2 virus wo uld expect to have a negative (not detected) result in this assay.   POCT Influenza A/B     Status: None   Collection Time: 02/26/23  2:57 PM  Result Value Ref Range   Influenza A, POC Negative Negative   Influenza B, POC Negative Negative  Basic metabolic panel     Status: Abnormal   Collection Time: 03/03/23 11:05 AM  Result Value Ref Range   Sodium 136 135 - 145 mmol/L   Potassium 4.3 3.5 - 5.1 mmol/L   Chloride 109 98 - 111 mmol/L   CO2 21 (L) 22 - 32 mmol/L   Glucose, Bld 237 (H) 70 - 99 mg/dL    Comment: Glucose reference range applies only to samples taken after fasting for at least 8 hours.   BUN 25 (H) 6  - 20 mg/dL   Creatinine, Ser 4.40 (H) 0.44 - 1.00 mg/dL   Calcium 8.7 (L) 8.9 - 10.3 mg/dL   GFR, Estimated >34 >74 mL/min    Comment: (NOTE) Calculated using the CKD-EPI Creatinine Equation (2021)    Anion gap 6 5 - 15    Comment: Performed at [facility], [address] [place], [place] [address]  POCT glycosylated hemoglobin (Hb A1C)     Status: Abnormal   Collection Time: 03/04/23  9:00  AM  Result Value Ref Range   Hemoglobin A1C 8.6 (A) 4.0 - 5.6 %   HbA1c POC (<> result, manual entry)     HbA1c, POC (prediabetic range)     HbA1c, POC (controlled diabetic range)    Lipid panel     Status: Abnormal   Collection Time: 03/09/23  1:55 PM  Result Value Ref Range   Cholesterol 138 <200 mg/dL   HDL 45 (L) > OR = 50 mg/dL   Triglycerides 64 <161 mg/dL   LDL Cholesterol (Calc) 79 mg/dL (calc)    Comment: Reference range: <100 . Desirable range <100 mg/dL for primary prevention;   <70 mg/dL for patients with CHD or diabetic patients  with > or = 2 CHD risk factors. Marland Kitchen LDL-C is now calculated using the Martin-Hopkins  calculation, which is a validated novel method providing  better accuracy than the Friedewald equation in the  estimation of LDL-C.  Horald Pollen et al. Lenox Ahr. 0960;454(09): 2061-2068  (http://education.[facility].com/faq/FAQ164)    Total CHOL/HDL Ratio 3.1 <5.0 (calc)   Non-HDL Cholesterol (Calc) 93 <811 mg/dL (calc)    Comment: For patients with diabetes plus 1 major ASCVD risk  factor, treating to a non-HDL-C goal of <100 mg/dL  (LDL-C of <91 mg/dL) is considered a therapeutic  option.   Microalbumin / creatinine urine ratio     Status: Abnormal   Collection Time: 03/09/23  1:55 PM  Result Value Ref Range   Creatinine, Urine 72 20 - 275 mg/dL   Microalb, Ur 4.8 mg/dL    Comment: Reference Range Not established    Microalb Creat Ratio 67 (H) <30 mg/g creat    Comment: . The ADA defines abnormalities in albumin excretion as  follows: Marland Kitchen Albuminuria Category        Result (mg/g creatinine) . Normal to Mildly increased   <30 Moderately increased         30-299  Severely increased           > OR = 300 . The ADA recommends that at least two of three specimens collected within a 3-6 month period be abnormal before considering a patient to be within a diagnostic category.   COMPLETE METABOLIC PANEL WITH GFR     Status: Abnormal   Collection Time: 03/09/23  1:55 PM  Result Value Ref Range   Glucose, Bld 124 (H) 65 - 99 mg/dL    Comment: .            Fasting reference interval . For someone without known diabetes, a glucose value between 100 and 125 mg/dL is consistent with prediabetes and should be confirmed with a follow-up test. .    BUN 18 7 - 25 mg/dL   Creat 4.78 2.95 - 6.21 mg/dL   eGFR 73 > OR = 60 HY/QMV/7.84O9   BUN/Creatinine Ratio SEE NOTE: 6 - 22 (calc)    Comment:    Not Reported: BUN and Creatinine are within    reference range. .    Sodium 140 135 - 146 mmol/L   Potassium 4.1 3.5 - 5.3 mmol/L   Chloride 108 98 - 110 mmol/L   CO2 24 20 - 32 mmol/L   Calcium 9.1 8.6 - 10.4 mg/dL   Total Protein 6.9 6.1 - 8.1 g/dL   Albumin 4.3 3.6 - 5.1 g/dL   Globulin 2.6 1.9 - 3.7 g/dL (calc)   AG Ratio 1.7 1.0 - 2.5 (calc)   Total Bilirubin 1.2 0.2 - 1.2 mg/dL  Alkaline phosphatase (APISO) 77 37 - 153 U/L   AST 15 10 - 35 U/L   ALT 18 6 - 29 U/L  Pregnancy, urine POC     Status: None   Collection Time: 03/23/23  8:29 AM  Result Value Ref Range   Preg Test, Ur NEGATIVE NEGATIVE    Comment:        THE SENSITIVITY OF THIS METHODOLOGY IS >24 mIU/mL      PHQ2/9:    05/19/2023    8:35 AM 05/18/2023    9:59 AM 03/09/2023    1:12 PM 02/26/2023    2:57 PM 11/30/2022    1:36 PM  Depression screen PHQ 2/9  Decreased Interest 0 0 0 0 0  Down, Depressed, Hopeless 0 0 0 0 0  PHQ - 2 Score 0 0 0 0 0  Altered sleeping 0 0 0  0  Tired, decreased energy 0 0 0  0  Change in appetite 0 0 0  0   Feeling bad or failure about yourself  0 0 0  0  Trouble concentrating 0 0 0  0  Moving slowly or fidgety/restless 0 0 0  0  Suicidal thoughts  0 0  0  PHQ-9 Score 0 0 0  0  Difficult doing work/chores     Not difficult at all    phq 9 is negative   Fall Risk:    05/19/2023    8:35 AM 03/09/2023    1:12 PM 02/26/2023    2:57 PM 11/30/2022    1:36 PM 10/29/2022    9:47 AM  Fall Risk   Falls in the past year? 0 0 0 0 0  Number falls in past yr:   0 0   Injury with Fall?   0 0   Risk for fall due to : No Fall Risks No Fall Risks   No Fall Risks  Follow up Falls prevention discussed Falls prevention discussed;Education provided;Falls evaluation completed   Falls prevention discussed;Education provided;Falls evaluation completed     Functional Status Survey: Is the patient deaf or have difficulty hearing?: No Does the patient have difficulty seeing, even when wearing glasses/contacts?: No Does the patient have difficulty concentrating, remembering, or making decisions?: No Does the patient have difficulty walking or climbing stairs?: No Does the patient have difficulty dressing or bathing?: No Does the patient have difficulty doing errands alone such as visiting a doctor's office or shopping?: No    Assessment & Plan  1. 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

## 2023-05-20 NOTE — Progress Notes (Signed)
Entresto 24/26 mg Lot: [id number] Exp: 06/13/2024 # 28 tablets

## 2023-05-24 ENCOUNTER — Other Ambulatory Visit: Payer: Medicaid Other | Admitting: Licensed Clinical Social Worker

## 2023-05-24 ENCOUNTER — Other Ambulatory Visit: Payer: Medicaid Other

## 2023-05-24 NOTE — Patient Outreach (Addendum)
Medicaid Managed Care Social Work Note  05/24/2023 Name:  Sarah Duffy MRN:  [id number] DOB:  November 10, 1971  Sarah Duffy is an 52 y.o. year old female who is a primary patient of Sarah Cory, MD.  The Medicaid Managed Care Coordination team was consulted for assistance with:  Mental Health Counseling and Resources  [name] was given information about Medicaid Managed Care Coordination team services today. Sarah Duffy Patient agreed to services and verbal consent obtained.  Engaged with patient  for by telephone forfollow up visit in response to referral for case management and/or care coordination services.   Assessments/Interventions:  Review of past medical history, allergies, medications, health status, including review of consultants reports, laboratory and other test data, was performed as part of comprehensive evaluation and provision of chronic care management services.  SDOH: (Social Determinant of Health) assessments and interventions performed: SDOH Interventions    Flowsheet Row Patient Outreach Telephone from 05/24/2023 in [place] POPULATION HEALTH DEPARTMENT Patient Outreach Telephone from 04/13/2023 in [place] POPULATION HEALTH DEPARTMENT Patient Outreach Telephone from 04/05/2023 in [place] POPULATION HEALTH DEPARTMENT Office Visit from 04/29/2021 in [facility] Office Visit from 02/11/2021 in [facility]  SDOH Interventions       Housing Interventions -- -- -- Other (Comment)  Sarah Duffy got some information from pre-op] --  Transportation Interventions -- -- Intervention Not Indicated -- --  Utilities Interventions -- Intervention Not Indicated -- -- --  Depression Interventions/Treatment  -- -- -- -- Medication  Stress Interventions Offered [employer], Provide Counseling -- [employer], Provide Counseling -- --       Advanced Directives Status:  See Care Plan for  related entries.  Care Plan                 No Known Allergies  Medications Reviewed Today     Reviewed by Benay Pike, CMA (Certified Medical Assistant) on 05/19/23 at [phone]  Med List Status: <None>   Medication Order Taking? Sig Documenting Provider Last Dose Status Informant  albuterol (VENTOLIN HFA) 108 (90 Base) MCG/ACT inhaler [id number] No TAKE 2 PUFFS BY MOUTH EVERY 6 HOURS AS NEEDED FOR WHEEZE OR SHORTNESS OF BREATH [provider] Taking Active   amLODipine (NORVASC) 5 MG tablet [id number] No Take 5 mg by mouth daily. [provider] Taking Active   atorvastatin (LIPITOR) 80 MG tablet [id number] No TAKE 1 TABLET BY MOUTH EVERY DAY [name], Danna Hefty, MD Taking Active   Blood Glucose Monitoring Suppl (ACCU-CHEK GUIDE) w/Device KIT [id number] No Use to check blood sugar 2 time daily as needed [name], [name], MD Taking Active   carvedilol (COREG) 12.5 MG tablet [id number] No Take 1 tablet (12.5 mg total) by mouth 2 (two) times daily. Creig Hines, NP Taking Active   Continuous Blood Gluc Sensor (DEXCOM G7 SENSOR) MISC [id number] No Use as directed. Apply 1 sensor every 10 days [provider] Taking Active   ENTRESTO 24-26 MG [id number] No Take 1 tablet by mouth 2 (two) times daily. [name], MD Taking Active   ezetimibe (ZETIA) 10 MG tablet [id number] No Take 10 mg by mouth daily. [provider] Taking Active   Galcanezumab-gnlm [facility]) 120 MG/ML SOAJ [id number]  Inject 120-240 mg into the skin every 30 (thirty) days. Sarah Cory, MD  Active   glucose blood (ACCU-CHEK GUIDE) test strip [id number] No Check blood sugar 2 times daily as needed [name], [name], MD Taking Active  insulin aspart (NOVOLOG FLEXPEN) 100 UNIT/ML FlexPen [id number] No Max daily 30 units [name], [name], MD Taking Active   Insulin Glargine [facility] KWIKPEN [place]) [id number] No Inject 90 Units into the skin daily. [provider] Taking Active   Insulin Pen Needle 32G X 4 MM MISC [id number] No 1 Device by Does not apply route in the morning, at noon, in the evening, and at bedtime. [name], [name], MD Taking Active   JARDIANCE 25 MG TABS tablet [id number] No Take 1 tablet (25 mg total) by mouth daily. [name], [name], MD Taking Active   Lancets (ACCU-CHEK MULTICLIX) lancets [id number] No Check sugar 2 times daily as needed. [name], [name], MD Taking Active   nitroGLYCERIN (NITROSTAT) 0.4 MG SL tablet [id number] No PLACE 1 TABLET UNDER THE TONGUE EVERY 5 MINUTES AS NEEDED FOR CHEST PAIN. [name], MD Taking Active   omeprazole (PRILOSEC) 40 MG capsule [id number] No TAKE 1 CAPSULE (40 MG TOTAL) BY MOUTH 2 (TWO) TIMES DAILY BEFORE A MEAL. [name], MD Taking Active   ondansetron [facility]) 4 MG tablet [id number]  Take 1 tablet (4 mg total) by mouth every 8 (eight) hours as needed for nausea or vomiting. Sarah Cory, MD  Active   Semaglutide,0.25 or 0.5MG /DOS, 2 MG/3ML [name] [id number] No Inject 0.5 mg into the skin once a week. [name], [name], MD Taking Active   spironolactone (ALDACTONE) 25 MG tablet [id number] No Take 1 tablet (25 mg total) by mouth daily. [name], MD Taking Active   sucralfate (CARAFATE) 1 GM/10ML suspension [id number] No Take 10 mLs (1 g total) by mouth 4 (four) times daily.  Patient taking differently: Take 1 g by mouth 4 (four) times daily as needed.   [name], MD Taking Active   triamcinolone cream (KENALOG) 0.1 % [id number] No Apply 1 Application topically 2 (two) times daily. Sarah Cory, MD Taking Active             Patient Active Problem List   Diagnosis Date Noted   Dysphagia 03/23/2023   Asthma, well controlled 03/09/2023   Depression, major, in remission (HCC) 03/09/2023   Perennial allergic rhinitis with seasonal variation 03/09/2023   Type 2 diabetes mellitus with diabetic polyneuropathy, with  long-term current use of insulin (HCC) 08/31/2022   Type 2 diabetes mellitus with retinopathy, with long-term current use of insulin (HCC) 08/31/2022   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(HCC) 08/31/2022   Hypertension 06/11/2022   Ischemic cardiomyopathy 06/11/2022   Coronary artery disease 06/11/2022   Chronic systolic heart failure (HCC) 06/11/2022   Angina pectoris associated with type 2 diabetes mellitus (HCC) 01/05/2022   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 edema associated with type 2 diabetes mellitus (HCC) 05/01/2021   Cardiomyopathy (HCC) 04/14/2021   Depression, major, recurrent, mild (HCC) 09/25/2019   History of 2019 novel coronavirus disease (COVID-19) 06/23/2019   Migraine headache without aura 01/14/2018   Type II diabetes mellitus (HCC) 05/22/2017   Diabetic retinopathy of left eye, with macular edema, with severe nonproliferative retinopathy, associated with type 2 diabetes mellitus 05/21/2016   Severe nonproliferative diabetic retinopathy of right eye without macular edema associated with type 2 diabetes mellitus (HCC) 05/21/2016   Hyperlipidemia LDL goal <70 03/10/2016   MI (mitral incompetence) 02/25/2016   Insomnia 02/25/2016   Anxiety 09/04/2014    Conditions to be addressed/monitored per PCP order:  Depression  Care Plan : LCSW Plan of Care  Updates made by Gustavus Bryant, LCSW since 05/24/2023 12:00  AM     Problem: Depression Identification (Depression)      Goal: Depressive Symptoms Identified   Start Date: 04/05/2023  Priority: High  Note:   Priority: High  Timeframe:  Long-Range Goal Priority:  High Start Date:   04/05/23           Expected End Date:  ongoing                     Follow Up Date--04/12/23 at 130 pm  - keep 90 percent of scheduled appointments -consider counseling or psychiatry -consider bumping up your self-care  -consider creating a stronger support network   Why is this important?              Combatting depression may take some time.            If you don't feel better right away, don't give up on your treatment plan.    Current barriers:   Chronic Mental Health needs related to depression, stress and anxiety. Patient requires Support, Education, Resources, Referrals, Advocacy, and Care Coordination, in order to meet Unmet Mental Health Needs. Grief from several family members  Patient will implement clinical interventions discussed today to decrease symptoms of depression and increase knowledge and/or ability of: coping skills. Mental Health Concerns and Social Isolation Physical concerns that are contributing to her depression and stress Patient lacks knowledge of available community counseling agencies and resources.  Clinical Goal(s): verbalize understanding of plan for management of Depression, and Stress and demonstrate a reduction in symptoms. Patient will connect with a provider for ongoing mental health treatment, increase coping skills, healthy habits, self-management skills, and stress reduction        Clinical Interventions:  Assessed patient's previous and current treatment, coping skills, support system and barriers to care. Patient provided hx  Verbalization of feelings encouraged, motivational interviewing employed Emotional support provided, positive coping strategies explored. Establishing healthy boundaries emphasized and healthy self-care education provided Patient was educated on available mental health resources within their area that accept Medicaid and offer counseling and psychiatry. Email sent to patient today with available mental health resources within her area that accept Medicaid and offer the services that she is interested in. Email included instructions for scheduling at [facility] as well as some crisis support resources and [facility]'s walk in clinic hours. LCSW provided education on relaxation techniques such as meditation, deep breathing, massage,  grounding exercises or yoga that can activate the body's relaxation response and ease symptoms of stress and anxiety. LCSW ask that when pt is struggling with difficult emotions and racing thoughts that they start this relaxation response process. LCSW provided extensive education on healthy coping skills for anxiety. SW used active and reflective listening, validated patient's feelings/concerns, and provided emotional support. Patient will work on implementing appropriate self-care habits into their daily routine such as: staying positive, writing a gratitude list, drinking water, staying active around the house, taking their medications as prescribed, combating negative thoughts or emotions and staying connected with their family and friends. Positive reinforcement provided for this decision to work on this. LCSW provided education on healthy sleep hygiene and healthy self-care and what that looks like. LCSW encouraged patient to implement a night time routine into their schedule that works best for them and that they are able to maintain. Advised patient to implement deep breathing/grounding/meditation/self-care exercises into their nightly routine to combat racing thoughts at night. LCSW encouraged patient to wake up at the same time each day, make  their sleeping environment comfortable, exercise when able, to limit naps and to not eat or drink anything right before bed.  Motivational Interviewing employed Depression screen reviewed  PHQ2/ PHQ9 completed or reviewed  Mindfulness or Relaxation training provided Active listening / Reflection utilized  Advance Care and HCPOA education provided Emotional Support Provided Problem Solving /Task Center strategies reviewed Provided psychoeducation for mental health needs  Provided brief CBT  Reviewed mental health medications and discussed importance of compliance:  Quality of sleep assessed & Sleep Hygiene techniques promoted  Participation in  counseling encouraged  Verbalization of feelings encouraged  Suicidal Ideation/Homicidal Ideation assessed: Patient denies SI/HI  Review resources, discussed options and provided patient information about  Mental Health Resources Inter-disciplinary care team collaboration (see longitudinal plan of care) 05/24/23-Patient reports that she is unable to see her new counselor at [facility] because of recent insurance changes. She was sent an additional email with other therapy resources within her area and educated on the [facility] walk in clinic as well. Patient reports doing extremely well with her mood and symptom management.   Patient Goals/Self-Care Activities: Over the next 120 days Attend scheduled medical appointments Utilize healthy coping skills and supportive resources discussed Contact PCP with any questions or concerns Keep 90 percent of counseling appointments Call your insurance provider for more information about your Enhanced Benefits  Check out counseling resources provided  Begin personal counseling with LCSW, to reduce and manage symptoms of Depression and Stress, until well-established with mental health provider Accept all calls from representative with [facility] in an effort to establish ongoing mental health counseling and supportive services. Incorporate into daily practice - relaxation techniques, deep breathing exercises, and mindfulness meditation strategies. Talk about feelings with friends, family members, spiritual advisor, etc. Contact LCSW directly [phone]), if you have questions, need assistance, or if additional social work needs are identified between now and our next scheduled telephone outreach call. Call 988 for mental health hotline/crisis line if needed (24/7 available) Try techniques to reduce symptoms of anxiety/negative thinking (deep breathing, distraction, positive self talk, etc)  - develop a personal safety plan - develop a plan to deal  with triggers like holidays, anniversaries - exercise at least 2 to 3 times per week - have a plan for how to handle bad days - journal feelings and what helps to feel better or worse - spend time or talk with others at least 2 to 3 times per week - watch for early signs of feeling worse - begin personal counseling - call and visit an old friend - check out volunteer opportunities - join a support group - laugh; watch a funny movie or comedian - learn and use visualization or guided imagery - perform a random act of kindness - practice relaxation or meditation daily - start or continue a personal journal - practice positive thinking and self-talk -continue with compliance of taking medication  -identify current effective and ineffective coping strategies.  -implement positive self-talk in care to increase self-esteem, confidence and feelings of control.  -consider alternative and complementary therapy approaches such as meditation, mindfulness or yoga.  -journaling, prayer, worship services, meditation or pastoral counseling.  -increase participation in pleasurable group activities such as hobbies, singing, sports or volunteering).  -consider the use of meditative movement therapy such as tai chi, yoga or qigong.  -start a regular daily exercise program based on tolerance, ability and patient choice to support positive thinking and activity    If you are experiencing a Mental Health  or Behavioral Health Crisis or need someone to talk to, please call the Suicide and Crisis Lifeline: 988    Patient Goals: Follow up goal       05/19/2023    8:35 AM 05/18/2023    9:59 AM 03/09/2023    1:12 PM 02/26/2023    2:57 PM 11/30/2022    1:36 PM  Depression screen PHQ 2/9  Decreased Interest 0 0 0 0 0  Down, Depressed, Hopeless 0 0 0 0 0  PHQ - 2 Score 0 0 0 0 0  Altered sleeping 0 0 0  0  Tired, decreased energy 0 0 0  0  Change in appetite 0 0 0  0  Feeling bad or failure about yourself  0  0 0  0  Trouble concentrating 0 0 0  0  Moving slowly or fidgety/restless 0 0 0  0  Suicidal thoughts  0 0  0  PHQ-9 Score 0 0 0  0  Difficult doing work/chores     Not difficult at all      05/19/2023    8:35 AM 05/18/2023   10:00 AM 03/09/2023    1:12 PM 10/29/2022    9:58 AM  GAD 7 : Generalized Anxiety Score  Nervous, Anxious, on Edge 0 0 0 0  Control/stop worrying 0 0 0 0  Worry too much - different things 0 0 0 0  Trouble relaxing 0 0 0 0  Restless 0 0 0 0  Easily annoyed or irritable 0 0 0 0  Afraid - awful might happen 0 0 0 0  Total GAD 7 Score 0 0 0 0    Follow up:  Patient agrees to Care Plan and Follow-up.  Plan: The Managed Medicaid care management team will reach out to the patient again over the next 30 days.  Dickie La, BSW, MSW, [employer] Managed Medicaid LCSW [facility]  [facility] [email or web address] Phone: [phone]

## 2023-05-24 NOTE — Patient Outreach (Signed)
Medicaid Managed Care Social Work Note  05/24/2023 Name:  Sarah Duffy MRN:  [id number] DOB:  [birth year]  Sarah Duffy is an 52 y.o. year old female who is a primary patient of [name], MD.  The Medicaid Managed Care Coordination team was consulted for assistance with:  Community Resources   Sarah Duffy was given information about Medicaid Managed Care Coordination team services today. Sarah Duffy Patient agreed to services and verbal consent obtained.  Engaged with patient  for by telephone forfollow up visit in response to referral for case management and/or care coordination services.   Assessments/Interventions:  Review of past medical history, allergies, medications, health status, including review of consultants reports, laboratory and other test data, was performed as part of comprehensive evaluation and provision of chronic care management services.  SDOH: (Social Determinant of Health) assessments and interventions performed: SDOH Interventions    Flowsheet Row Patient Outreach Telephone from 04/13/2023 in [place] [facility] POPULATION HEALTH DEPARTMENT Patient Outreach Telephone from 04/05/2023 in [place] POPULATION HEALTH DEPARTMENT Office Visit from 04/29/2021 in [facility] Office Visit from 02/11/2021 in [facility] [place]  SDOH Interventions      Housing Interventions -- -- Other (Comment)  Sarah Duffy got some information from pre-op] --  Transportation Interventions -- Intervention Not Indicated -- --  Utilities Interventions Intervention Not Indicated -- -- --  Depression Interventions/Treatment  -- -- -- Medication  Stress Interventions -- [employer], Provide Counseling -- --     Sarah Duffy completed a telephone outreach with patient. She states she is in therapy and likes it. Sarah Duffy did provide patient with information for applying for foodstamps. No other resources are needed at this time.  Advanced Directives  Status:  Not addressed in this encounter.  Care Plan                 No Known Allergies  Medications Reviewed Today     Reviewed by Benay Pike, CMA (Certified Medical Assistant) on 05/19/23 at [phone]  Med List Status: <None>   Medication Order Taking? Sig Documenting Provider Last Dose Status Informant  albuterol (VENTOLIN HFA) 108 (90 Base) MCG/ACT inhaler [id number] No TAKE 2 PUFFS BY MOUTH EVERY 6 HOURS AS NEEDED FOR WHEEZE OR SHORTNESS OF BREATH [provider] Taking Active   amLODipine (NORVASC) 5 MG tablet [id number] No Take 5 mg by mouth daily. [provider] Taking Active   atorvastatin (LIPITOR) 80 MG tablet [id number] No TAKE 1 TABLET BY MOUTH EVERY DAY [name], Danna Hefty, MD Taking Active   Blood Glucose Monitoring Suppl (ACCU-CHEK GUIDE) w/Device KIT [id number] No Use to check blood sugar 2 time daily as needed [name], [name], MD Taking Active   carvedilol (COREG) 12.5 MG tablet [id number] No Take 1 tablet (12.5 mg total) by mouth 2 (two) times daily. Creig Hines, NP Taking Active   Continuous Blood Gluc Sensor (DEXCOM G7 SENSOR) MISC [id number] No Use as directed. Apply 1 sensor every 10 days [provider] Taking Active   ENTRESTO 24-26 MG [id number] No Take 1 tablet by mouth 2 (two) times daily. [name], MD Taking Active   ezetimibe (ZETIA) 10 MG tablet [id number] No Take 10 mg by mouth daily. [provider] Taking Active   Galcanezumab-gnlm [facility]) 120 MG/ML SOAJ [id number]  Inject 120-240 mg into the skin every 30 (thirty) days. [name], MD  Active   glucose blood (ACCU-CHEK GUIDE) test strip [id number] No Check blood sugar 2 times daily as  needed [name], [name], MD Taking Active   insulin aspart (NOVOLOG FLEXPEN) 100 UNIT/ML FlexPen [id number] No Max daily 30 units [name], [name], MD Taking Active   Insulin Glargine [facility] KWIKPEN [place]) [id number] No Inject 90 Units into  the skin daily. [provider] Taking Active   Insulin Pen Needle 32G X 4 MM MISC [id number] No 1 Device by Does not apply route in the morning, at noon, in the evening, and at bedtime. [name], [name], MD Taking Active   JARDIANCE 25 MG TABS tablet [id number] No Take 1 tablet (25 mg total) by mouth daily. [name], [name], MD Taking Active   Lancets (ACCU-CHEK MULTICLIX) lancets [id number] No Check sugar 2 times daily as needed. [name], [name], MD Taking Active   nitroGLYCERIN (NITROSTAT) 0.4 MG SL tablet [id number] No PLACE 1 TABLET UNDER THE TONGUE EVERY 5 MINUTES AS NEEDED FOR CHEST PAIN. [name], MD Taking Active   omeprazole (PRILOSEC) 40 MG capsule [id number] No TAKE 1 CAPSULE (40 MG TOTAL) BY MOUTH 2 (TWO) TIMES DAILY BEFORE A MEAL. [name], MD Taking Active   ondansetron [facility]) 4 MG tablet [id number]  Take 1 tablet (4 mg total) by mouth every 8 (eight) hours as needed for nausea or vomiting. [name], MD  Active   Semaglutide,0.25 or 0.5MG /DOS, 2 MG/3ML [name] [id number] No Inject 0.5 mg into the skin once a week. [name], [name], MD Taking Active   spironolactone (ALDACTONE) 25 MG tablet [id number] No Take 1 tablet (25 mg total) by mouth daily. [name], MD Taking Active   sucralfate (CARAFATE) 1 GM/10ML suspension [id number] No Take 10 mLs (1 g total) by mouth 4 (four) times daily.  Patient taking differently: Take 1 g by mouth 4 (four) times daily as needed.   [name], MD Taking Active   triamcinolone cream (KENALOG) 0.1 % [id number] No Apply 1 Application topically 2 (two) times daily. [name], MD Taking Active             Patient Active Problem List   Diagnosis Date Noted   Dysphagia 03/23/2023   Asthma, well controlled 03/09/2023   Depression, major, in remission (HCC) 03/09/2023   Perennial allergic rhinitis with seasonal variation 03/09/2023   Type 2 diabetes mellitus with diabetic  polyneuropathy, with long-term current use of insulin (HCC) 08/31/2022   Type 2 diabetes mellitus with retinopathy, with long-term current use of insulin (HCC) 08/31/2022   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(HCC) 08/31/2022   Hypertension 06/11/2022   Ischemic cardiomyopathy 06/11/2022   Coronary artery disease 06/11/2022   Chronic systolic heart failure (HCC) 06/11/2022   Angina pectoris associated with type 2 diabetes mellitus (HCC) 01/05/2022   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 edema associated with type 2 diabetes mellitus (HCC) 05/01/2021   Cardiomyopathy (HCC) 04/14/2021   Depression, major, recurrent, mild (HCC) 09/25/2019   History of 2019 novel coronavirus disease (COVID-19) 06/23/2019   Migraine headache without aura 01/14/2018   Type II diabetes mellitus (HCC) 05/22/2017   Diabetic retinopathy of left eye, with macular edema, with severe nonproliferative retinopathy, associated with type 2 diabetes mellitus 05/21/2016   Severe nonproliferative diabetic retinopathy of right eye without macular edema associated with type 2 diabetes mellitus (HCC) 05/21/2016   Hyperlipidemia LDL goal <70 03/10/2016   MI (mitral incompetence) 02/25/2016   Insomnia 02/25/2016   Anxiety 09/04/2014    Conditions to be addressed/monitored per PCP order:   commuity resources  There are no care plans that you  recently modified to display for this patient.   Follow up:  Patient agrees to Care Plan and Follow-up.  Plan: The Managed Medicaid care management team will reach out to the patient again over the next 30 days.  Date/time of next scheduled Social Work care management/care coordination outreach:  06/23/23  Gus Puma, Kenard Gower, [facility]  Managed [facility] Social Worker [phone]

## 2023-05-24 NOTE — Patient Instructions (Signed)
Visit Information  Sarah Duffy was given information about Medicaid Managed Care team care coordination services as a part of their [facility] Community Plan Medicaid benefit. Sarah Duffy verbally consented to engagement with the [facility] Managed Care team.   If you are experiencing a medical emergency, please call 911 or report to your local emergency department or urgent care.   If you have a non-emergency medical problem during routine business hours, please contact your provider's office and ask to speak with a nurse.   For questions related to your [facility], please call: [phone] or visit the homepage here: [email or web address]  If you would like to schedule transportation through your [facility], please call the following number at least 2 days in advance of your appointment: [phone]   Rides for urgent appointments can also be made after hours by calling Member Services.  Call the Behavioral Health Crisis Line at [phone], at any time, 24 hours a day, 7 days a week. If you are in danger or need immediate medical attention call 911.  If you would like help to quit smoking, call 1-800-QUIT-NOW (337 347 0654) OR Espaol: 1-855-Djelo-Ya (1-324-401-0272) o para ms informacin haga clic aqu or Text READY to 536-644 to register via text  Following is a copy of your plan of care:  Care Plan : LCSW Plan of Care  Updates made by Sarah Bryant, LCSW since 05/24/2023 12:00 AM     Problem: Depression Identification (Depression)      Goal: Depressive Symptoms Identified   Start Date: 04/05/2023  Priority: High  Note:   Priority: High  Timeframe:  Long-Range Goal Priority:  High Start Date:   04/05/23           Expected End Date:  ongoing                     Follow Up Date--06/10/23 at 230 pm  - keep 90 percent of scheduled appointments -consider counseling  or psychiatry -consider bumping up your self-care  -consider creating a stronger support network   Why is this important?             Combatting depression may take some time.            If you don't feel better right away, don't give up on your treatment plan.    Current barriers:   Chronic Mental Health needs related to depression, stress and anxiety. Patient requires Support, Education, Resources, Referrals, Advocacy, and Care Coordination, in order to meet Unmet Mental Health Needs. Grief from several family members  Patient will implement clinical interventions discussed today to decrease symptoms of depression and increase knowledge and/or ability of: coping skills. Mental Health Concerns and Social Isolation Physical concerns that are contributing to her depression and stress Patient lacks knowledge of available community counseling agencies and resources.  Clinical Goal(s): verbalize understanding of plan for management of Depression, and Stress and demonstrate a reduction in symptoms. Patient will connect with a provider for ongoing mental health treatment, increase coping skills, healthy habits, self-management skills, and stress reduction      Patient Goals/Self-Care Activities: Over the next 120 days Attend scheduled medical appointments Utilize healthy coping skills and supportive resources discussed Contact PCP with any questions or concerns Keep 90 percent of counseling appointments Call your insurance provider for more information about your Enhanced Benefits  Check out counseling resources provided  Begin personal counseling with LCSW, to reduce and manage symptoms  of Depression and Stress, until well-established with mental health provider Accept all calls from representative with [facility] in an effort to establish ongoing mental health counseling and supportive services. Incorporate into daily practice - relaxation techniques, deep breathing exercises, and mindfulness  meditation strategies. Talk about feelings with friends, family members, spiritual advisor, etc. Contact LCSW directly [phone]), if you have questions, need assistance, or if additional social work needs are identified between now and our next scheduled telephone outreach call. Call 988 for mental health hotline/crisis line if needed (24/7 available) Try techniques to reduce symptoms of anxiety/negative thinking (deep breathing, distraction, positive self talk, etc)  - develop a personal safety plan - develop a plan to deal with triggers like holidays, anniversaries - exercise at least 2 to 3 times per week - have a plan for how to handle bad days - journal feelings and what helps to feel better or worse - spend time or talk with others at least 2 to 3 times per week - watch for early signs of feeling worse - begin personal counseling - call and visit an old friend - check out volunteer opportunities - join a support group - laugh; watch a funny movie or comedian - learn and use visualization or guided imagery - perform a random act of kindness - practice relaxation or meditation daily - start or continue a personal journal - practice positive thinking and self-talk -continue with compliance of taking medication  -identify current effective and ineffective coping strategies.  -implement positive self-talk in care to increase self-esteem, confidence and feelings of control.  -consider alternative and complementary therapy approaches such as meditation, mindfulness or yoga.  -journaling, prayer, worship services, meditation or pastoral counseling.  -increase participation in pleasurable group activities such as hobbies, singing, sports or volunteering).  -consider the use of meditative movement therapy such as tai chi, yoga or qigong.  -start a regular daily exercise program based on tolerance, ability and patient choice to support positive thinking and activity    If you are  experiencing a Mental Health or Behavioral Health Crisis or need someone to talk to, please call the Suicide and Crisis Lifeline: 988    Patient Goals: Follow up goal

## 2023-05-24 NOTE — Patient Instructions (Signed)
Visit Information  Sarah Duffy was given information about Medicaid Managed Care team care coordination services as a part of their [facility] Community Plan Medicaid benefit. Sarah Duffy verbally consented to engagement with the [facility] Managed Care team.   If you are experiencing a medical emergency, please call 911 or report to your local emergency department or urgent care.   If you have a non-emergency medical problem during routine business hours, please contact your provider's office and ask to speak with a nurse.   For questions related to your [facility], please call: [phone] or visit the homepage here: [email or web address]  If you would like to schedule transportation through your [facility], please call the following number at least 2 days in advance of your appointment: [phone]   Rides for urgent appointments can also be made after hours by calling Member Services.  Call the Behavioral Health Crisis Line at [phone]) [phone], at any time, 24 hours a day, 7 days a week. If you are in danger or need immediate medical attention call 911.  If you would like help to quit smoking, call 1-800-QUIT-NOW (919-509-4404) OR Espaol: 1-855-Djelo-Ya (7-824-235-3614) o para ms informacin haga clic aqu or Text READY to 431-540 to register via text  Sarah Duffy - following are the goals we discussed in your visit today:    Social Worker will follow up in 30 days.   Gus Puma, Kenard Gower, M[facility]  Managed Medicaid Social Worker [phone]   Following is a copy of your plan of care:  There are no care plans that you recently modified to display for this patient.

## 2023-05-31 ENCOUNTER — Other Ambulatory Visit: Payer: Self-pay | Admitting: Family Medicine

## 2023-06-03 ENCOUNTER — Ambulatory Visit: Payer: Medicaid Other | Admitting: Nurse Practitioner

## 2023-06-07 ENCOUNTER — Ambulatory Visit (INDEPENDENT_AMBULATORY_CARE_PROVIDER_SITE_OTHER): Payer: Medicaid Other | Admitting: Physician Assistant

## 2023-06-07 ENCOUNTER — Encounter: Payer: Self-pay | Admitting: Physician Assistant

## 2023-06-07 VITALS — BP 136/72 | HR 86 | Temp 98.0°F | Resp 18 | Ht 69.0 in | Wt 174.3 lb

## 2023-06-07 DIAGNOSIS — L259 Unspecified contact dermatitis, unspecified cause: Secondary | ICD-10-CM

## 2023-06-07 DIAGNOSIS — G43009 Migraine without aura, not intractable, without status migrainosus: Secondary | ICD-10-CM | POA: Diagnosis not present

## 2023-06-07 NOTE — Progress Notes (Unsigned)
Acute Office Visit   Patient: Sarah Duffy   DOB: 1971/04/17   52 y.o. Female  MRN: [id number] Visit Date: 06/07/2023  Today's healthcare provider: Oswaldo Conroy Khaalid Lefkowitz, PA-C  Introduced myself to the patient as a Secondary school teacher and provided education on APPs in clinical practice.    Chief Complaint  Patient presents with   Headache    Constant, Emgality has helped no major ones like before, but concerned about taking tylenol and excedrin everyday   Rash    Left ankle no improvement since cream given by Dr. Carlynn Purl   Subjective    HPI HPI     Headache    Additional comments: Constant, Emgality has helped no major ones like before, but concerned about taking tylenol and excedrin everyday        Rash    Additional comments: Left ankle no improvement since cream given by Dr. Carlynn Purl      Last edited by Marcos Eke, CMA on 06/07/2023 11:33 AM.        MIGRAINES She reports current daily headaches that are better than her previous migraines but she is concerned about having to use Excedrin and Tylenol  She is taking about 1200 mg Tylenol BID  She take one Excedrin migraine in AM and a second dose later in the afternoon   Duration: years Onset: gradual Severity: 3/10 Quality: sharp, aching, stabbing, and throbbing Frequency:  hitting every day  Location: occipital area and behind left eye  Headache duration: Radiation: yes Time of day headache occurs: usually occurs at night as she is trying to go to sleep, then when she wakes up she has pain in temple and around left eye  Alleviating factors:  Aggravating factors:  Headache status at time of visit: current headache Treatments attempted: Treatments attempted: rest, heat, APAP, ibuprofen, and aleve", excedrine , she has Emgality injection was given on 05/19/23  in office  Aura: no Nausea:  no not anymore  Vomiting: no Photophobia:  no Phonophobia:  no Effect on social functioning:   she has been able to go about her day  after the injection  Numbers of missed days of school/work each month:  Confusion:  no Gait disturbance/ataxia:  no Behavioral changes:  no Fevers:  no   RASH Duration:  months  Location: legs  Itching: yes Burning: no Redness: no Oozing: no Scaling: yes Blisters: no Painful: no Fevers: no Change in detergents/soaps/personal care products: no Recent illness: no Recent travel:no History of same: no Context: stable She reports it seems a bit worse after the shower Alleviating factors:  was given Kenalog cream for this in March  Treatments attempted: was given Kenalog In March by PCP  Shortness of breath: no  Throat/tongue swelling: no Myalgias/arthralgias: no   Medications: Outpatient Medications Prior to Visit  Medication Sig   albuterol (VENTOLIN HFA) 108 (90 Base) MCG/ACT inhaler TAKE 2 PUFFS BY MOUTH EVERY 6 HOURS AS NEEDED FOR WHEEZE OR SHORTNESS OF BREATH   amLODipine (NORVASC) 5 MG tablet TAKE 1 TABLET (5 MG TOTAL) BY MOUTH DAILY.   atorvastatin (LIPITOR) 80 MG tablet TAKE 1 TABLET BY MOUTH EVERY DAY   Blood Glucose Monitoring Suppl (ACCU-CHEK GUIDE) w/Device KIT Use to check blood sugar 2 time daily as needed   carvedilol (COREG) 12.5 MG tablet Take 1 tablet (12.5 mg total) by mouth 2 (two) times daily.   Continuous Blood Gluc Sensor (DEXCOM G7 SENSOR) MISC Use as directed. Apply 1  sensor every 10 days   ENTRESTO 24-26 MG Take 1 tablet by mouth 2 (two) times daily.   ezetimibe (ZETIA) 10 MG tablet Take 10 mg by mouth daily.   Galcanezumab-gnlm (EMGALITY) 120 MG/ML SOAJ Inject 120-240 mg into the skin every 30 (thirty) days.   glucose blood (ACCU-CHEK GUIDE) test strip Check blood sugar 2 times daily as needed   insulin aspart (NOVOLOG FLEXPEN) 100 UNIT/ML FlexPen Max daily 30 units   Insulin Glargine (BASAGLAR KWIKPEN [place]) Inject 90 Units into the skin daily.   Insulin Pen Needle 32G X 4 MM MISC 1 Device by Does not apply route in the morning, at noon, in the  evening, and at bedtime.   JARDIANCE 25 MG TABS tablet Take 1 tablet (25 mg total) by mouth daily.   Lancets (ACCU-CHEK MULTICLIX) lancets Check sugar 2 times daily as needed.   nitroGLYCERIN (NITROSTAT) 0.4 MG SL tablet PLACE 1 TABLET UNDER THE TONGUE EVERY 5 MINUTES AS NEEDED FOR CHEST PAIN.   omeprazole (PRILOSEC) 40 MG capsule TAKE 1 CAPSULE (40 MG TOTAL) BY MOUTH 2 (TWO) TIMES DAILY BEFORE A MEAL.   ondansetron (ZOFRAN) 4 MG tablet Take 1 tablet (4 mg total) by mouth every 8 (eight) hours as needed for nausea or vomiting.   Semaglutide,0.25 or 0.5MG /DOS, 2 MG/3ML SOPN Inject 0.5 mg into the skin once a week.   spironolactone (ALDACTONE) 25 MG tablet Take 1 tablet (25 mg total) by mouth daily.   sucralfate (CARAFATE) 1 GM/10ML suspension Take 10 mLs (1 g total) by mouth 4 (four) times daily. (Patient taking differently: Take 1 g by mouth 4 (four) times daily as needed.)   triamcinolone cream (KENALOG) 0.1 % Apply 1 Application topically 2 (two) times daily.   No facility-administered medications prior to visit.    Review of Systems  Constitutional:  Negative for fever.  Skin:  Positive for rash.  Neurological:  Positive for headaches. Negative for dizziness and light-headedness.    {Labs  Heme  Chem  Endocrine  Serology  Results Review (optional):23779}   Objective    BP 136/72   Pulse 86   Temp 98 F (36.7 C)   Resp 18   Ht 5\' 9"  (1.753 m)   Wt 174 lb 4.8 oz (79.1 kg)   LMP  (LMP Unknown)   SpO2 98%   BMI 25.74 kg/m  {Show previous vital signs (optional):23777}  Physical Exam Vitals reviewed.  Constitutional:      Appearance: Normal appearance. She is well-developed.  HENT:     Head: Normocephalic and atraumatic.  Eyes:     Extraocular Movements: Extraocular movements intact.     Pupils: Pupils are equal, round, and reactive to light.  Pulmonary:     Effort: Pulmonary effort is normal.  Musculoskeletal:     Cervical back: Normal range of motion and neck  supple.  Skin:    General: Skin is warm and dry.     Findings: Rash present. Rash is papular and scaling.  Neurological:     Mental Status: She is alert and oriented to person, place, and time.     GCS: GCS eye subscore is 4. GCS verbal subscore is 5. GCS motor subscore is 6.  Psychiatric:        Mood and Affect: Mood normal.        Speech: Speech normal.        Behavior: Behavior normal.       No results found for any visits on 06/07/23.  Assessment & Plan      No follow-ups on file.

## 2023-06-07 NOTE — Patient Instructions (Signed)
Make sure you are keeping the areas well moisturized with Aquaphor, Vaseline or another thick ointment

## 2023-06-08 DIAGNOSIS — L259 Unspecified contact dermatitis, unspecified cause: Secondary | ICD-10-CM | POA: Insufficient documentation

## 2023-06-08 NOTE — Assessment & Plan Note (Signed)
Chronic, historic condition Patient appears to be slowly improving from recent exacerbation Earlier this month she was provided with loading dose of Emgality by her PCP and she reports that her migraine headaches appear to be much improved.  She does report continued concerns for mild daily headaches as well as her use of medications such as Excedrin and Tylenol. We reviewed the dosing of these and appropriate daily maximums for each. I recommend that she waits until she is on her maintenance therapy of Emgality before making further changes to her regimen. Patient voiced agreement and understanding of this plan I recommend follow-up with her PCP after she has been on Emgality maintenance dosing for several weeks to discuss response

## 2023-06-08 NOTE — Assessment & Plan Note (Signed)
Chronic, historic condition Patient reports persistent maculopapular, scaling rash along her lower legs that appears consistent with eczema We reviewed importance of keeping area well moisturized with occlusive ointment such as Aquaphor or Vaseline as well as taking lukewarm showers and using gentle soaps to avoid irritation She can continue to use Kenalog cream as needed for itching Follow-up as needed for progressing or persistent symptoms

## 2023-06-10 ENCOUNTER — Other Ambulatory Visit: Payer: Medicaid Other | Admitting: Licensed Clinical Social Worker

## 2023-06-11 NOTE — Patient Outreach (Addendum)
Care Coordination  06/11/2023  Sarah Duffy 1971/11/15 [id number] Medicaid Managed Care   Unsuccessful Attempt Note   06/11/2023 Name: Sarah Duffy MRN: [id number] DOB: 05-08-1971  Referred by: [name], MD Reason for referral : No chief complaint on file.   An unsuccessful telephone outreach was attempted today. The patient was referred to the case management team for assistance with care management and care coordination.    Follow Up Plan: A HIPAA compliant phone message was left for the patient providing contact information and requesting a return call. Email sent as well when no return call was made.  Dickie La, BSW, MSW, [employer] Managed Medicaid LCSW [facility]  [facility] [email or web address] Phone: [phone]

## 2023-06-11 NOTE — Patient Instructions (Signed)
Preslea Rudd ,   The Medicaid Managed Care Team is available to provide assistance to you with your healthcare needs at no cost and as a benefit of your Medicaid Health plan. I'm sorry I was unable to reach you today for our scheduled appointment. Our care guide will call you to reschedule our telephone appointment. Please call me at the number below. I am available to be of assistance to you regarding your healthcare needs. .   Thank you,   Riyanshi Wahab, BSW, MSW, LCSW Managed Medicaid LCSW [place]  [facility] [email or web address] Phone: [phone]   

## 2023-06-16 ENCOUNTER — Telehealth: Payer: Self-pay

## 2023-06-16 NOTE — Telephone Encounter (Signed)
.   Medicaid Managed Care   Unsuccessful Outreach Note  06/16/2023 Name: Sarah Duffy MRN: [id number] DOB: [birth year]  Referred by: [name], MD Reason for referral : Appointment (I called her today to reschedule her missed phone appointment with the MM LCSW. I left a message on her voicemail.)   A second unsuccessful telephone outreach was attempted today. The patient was referred to the case management team for assistance with care management and care coordination.   Follow Up Plan: A HIPAA compliant phone message was left for the patient providing contact information and requesting a return call.  The care management team will reach out to the patient again over the next 7 days.   Weston Settle Care Guide  [facility] Managed  Care Guide [facility]  [phone]

## 2023-06-21 ENCOUNTER — Other Ambulatory Visit (HOSPITAL_COMMUNITY): Payer: Self-pay

## 2023-06-21 ENCOUNTER — Telehealth: Payer: Self-pay

## 2023-06-21 ENCOUNTER — Ambulatory Visit: Payer: Medicaid Other | Admitting: Internal Medicine

## 2023-06-21 ENCOUNTER — Encounter: Payer: Self-pay | Admitting: Internal Medicine

## 2023-06-21 VITALS — BP 132/78 | HR 66 | Ht 69.0 in | Wt 181.0 lb

## 2023-06-21 DIAGNOSIS — E1142 Type 2 diabetes mellitus with diabetic polyneuropathy: Secondary | ICD-10-CM | POA: Diagnosis not present

## 2023-06-21 DIAGNOSIS — E11319 Type 2 diabetes mellitus with unspecified diabetic retinopathy without macular edema: Secondary | ICD-10-CM | POA: Diagnosis not present

## 2023-06-21 DIAGNOSIS — E1165 Type 2 diabetes mellitus with hyperglycemia: Secondary | ICD-10-CM

## 2023-06-21 DIAGNOSIS — E1159 Type 2 diabetes mellitus with other circulatory complications: Secondary | ICD-10-CM

## 2023-06-21 DIAGNOSIS — Z794 Long term (current) use of insulin: Secondary | ICD-10-CM | POA: Diagnosis not present

## 2023-06-21 LAB — POCT GLYCOSYLATED HEMOGLOBIN (HGB A1C): Hemoglobin A1C: 8.4 % — AB (ref 4.0–5.6)

## 2023-06-21 MED ORDER — BASAGLAR KWIKPEN 100 UNIT/ML ~~LOC~~ SOPN: 70.0000 [IU] | PEN_INJECTOR | Freq: Every day | SUBCUTANEOUS | 3 refills | Status: AC

## 2023-06-21 MED ORDER — NOVOLOG FLEXPEN 100 UNIT/ML ~~LOC~~ SOPN: PEN_INJECTOR | SUBCUTANEOUS | 3 refills | Status: DC

## 2023-06-21 MED ORDER — INSULIN PEN NEEDLE 32G X 4 MM MISC: 1.0000 | Freq: Four times a day (QID) | 3 refills | Status: AC

## 2023-06-21 MED ORDER — SEMAGLUTIDE(0.25 OR 0.5MG/DOS) 2 MG/3ML ~~LOC~~ SOPN: 0.5000 mg | PEN_INJECTOR | SUBCUTANEOUS | 6 refills | Status: DC

## 2023-06-21 MED ORDER — JARDIANCE 25 MG PO TABS
25.00 mg | ORAL_TABLET | Freq: Every day | ORAL | 2 refills | Status: AC
Start: 2023-06-21 — End: ?

## 2023-06-21 NOTE — Patient Instructions (Addendum)
Decrease Basaglar 70 units daily  Increase Ozempic 0.5 mg weekly Continue Jardiance 25 mg daily  Novolog correctional insulin:  Use the scale below to help guide you three times a day ( breakfast, lunch and supper)   Blood sugar before meal Number of units to inject  Less than 150 0 unit  151 -  180 1 units  181 -  210 2 units  211 -  240 3 units  241 -  270 4 units  271 -  300 5 units  301 -  330 6 units  331 -  360 7 units  361 -  390 8 units       HOW TO TREAT LOW BLOOD SUGARS (Blood sugar LESS THAN 70 MG/DL) Please follow the RULE OF 15 for the treatment of hypoglycemia treatment (when your (blood sugars are less than 70 mg/dL)   STEP 1: Take 15 grams of carbohydrates when your blood sugar is low, which includes:  3-4 GLUCOSE TABS  OR 3-4 OZ OF JUICE OR REGULAR SODA OR ONE TUBE OF GLUCOSE GEL    STEP 2: RECHECK blood sugar in 15 MINUTES STEP 3: If your blood sugar is still low at the 15 minute recheck --> then, go back to STEP 1 and treat AGAIN with another 15 grams of carbohydrates.

## 2023-06-21 NOTE — Telephone Encounter (Signed)
Patient Advocate Encounter   Received notification from [facility] that prior authorization is required for Dexcom G7 sensor  PA for this was submitted and approved 03/26/23 through 09/25/23.   Insurance will only cover a 30 day supply.   Per test claim: Copay for a 30 day supply is $4

## 2023-06-21 NOTE — Progress Notes (Signed)
Name: Sarah Duffy  MRN/ DOB: [id number], 1971-05-05   Age/ Sex: 52 y.o., female    PCP: [name], MD   Reason for Endocrinology Evaluation: Type 2 Diabetes Mellitus     Date of Initial Endocrinology Visit: 08/31/2022    PATIENT IDENTIFIER: Sarah Duffy is a 52 y.o. female with a past medical history of t2DM, Dyslipidemia, CAD, CHF. The patient presented for initial endocrinology clinic visit on 08/31/2022 for consultative assistance with her diabetes management.    HPI: Sarah Duffy was    Diagnosed with DM years ago  Prior Medications tried/Intolerance: Metformin, glipizide, switch to insulin in 2017 after a huge left foot infection . Was on prandial insulin at some point         Hemoglobin A1c has ranged from 7.9% in 2023, peaking at 13.0% in 2020.  Has hx of left foot debridement  in 2017, had another infection and injury in 2021 while walking on a seashell   On her initial visit to our clinic she had an A1c of 7.9%, she was on Basaglar, Victoza, and Jardiance.  We switched Basaglar to [employer], switched Victoza to Trulicity and continued Jardiance     SUBJECTIVE:   During the last visit (03/03/2022): A1c 8.1%   Today (06/21/23): Sarah Duffy is here for follow-up on diabetes management.  She  checks her blood sugars multiple times daily. The patient has  had hypoglycemic episodes since the last clinic visit, which typically occur at night .   Denies nausea, vomiting  Denies constipation  diarrhea     HOME DIABETES REGIMEN: Jardiance 25 mg daily  Ozempic 0.25 mg once weekly Basaglar 90 units daily Ozempic 0.5 mg weekly NovoLog Per correction factor : BG-120/30)    Statin: yes ACE-I/ARB: yes   CONTINUOUS GLUCOSE MONITORING RECORD INTERPRETATION    Dates of Recording: 6/25-06/21/2023  Sensor description:dexcom  Results statistics:   CGM use % of time 93  Average and SD 197/65  Time in range  40  %  % Time Above 180 38  % Time above 250 20  %  Time Below target 1   Glycemic patterns summary: BG's trend down overnight, and increase during the day  Hyperglycemic episodes postprandial  Hypoglycemic episodes occurred at night  Overnight periods: BG's trend down   DIABETIC COMPLICATIONS: Microvascular complications:  Retinopathy, neuropathy (s/p eye injections 02/2023) Denies: CKD Last eye exam: Completed 02/2022  Macrovascular complications:  CAD Denies: PVD, CVA   PAST HISTORY: Past Medical History:  Past Medical History:  Diagnosis Date   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 02/2021 Echo: EF 35-40%, glob HK, GrI DD, nl RV fxn, mildly dil LA, mod MR; b. 06/2022 Echo: EF 50-55%, no rwma, GrII DD, nl RV fxn, RVSP , mildly dil LA, mild MR, AoV sclerosis.   CKD (chronic kidney disease), stage III (HCC)    Complication of anesthesia 03/23/2023   Possible gastroparesis based off food in stomach during EGD   Coronary artery disease    a. 04/2021 Cath: LM nl, LAD 70p/m, 33m, 80d, LCX 62m, OM1 40, OM2 99 (fills via collats from OM1), RCA 60d, RPDA 75.   COVID-19 2021   Hyperlipidemia LDL goal <70    Hypertension    Ischemic cardiomyopathy    a. 02/2021 Echo: EF 35-40%, glob HK, GrI DD; b. 06/2022 Echo: EF 50-55%.   Moderate mitral regurgitation    a. 02/2021 Echo: Mod MR.   MRSA infection within  last 3 months 02/25/2016   Osteomyelitis of foot (HCC) 08/26/2016   Type II diabetes mellitus (HCC)    Past Surgical History:  Past Surgical History:  Procedure Laterality Date   CHOLECYSTECTOMY  1999   COLONOSCOPY WITH PROPOFOL N/A 03/25/2021   Procedure: COLONOSCOPY WITH PROPOFOL;  Surgeon: [name], MD;  Location: [facility] ENDOSCOPY;  Service: Gastroenterology;  Laterality: N/A;   COLONOSCOPY WITH PROPOFOL N/A 04/17/2021   Procedure: COLONOSCOPY WITH PROPOFOL;  Surgeon: [name], MD;  Location: [facility] ENDOSCOPY;  Service: Gastroenterology;  Laterality: N/A;   ESOPHAGOGASTRODUODENOSCOPY  (EGD) WITH PROPOFOL N/A 03/25/2021   Procedure: ESOPHAGOGASTRODUODENOSCOPY (EGD) WITH PROPOFOL;  Surgeon: [name], MD;  Location: [facility] ENDOSCOPY;  Service: Gastroenterology;  Laterality: N/A;   ESOPHAGOGASTRODUODENOSCOPY (EGD) WITH PROPOFOL N/A 04/17/2021   Procedure: ESOPHAGOGASTRODUODENOSCOPY (EGD) WITH PROPOFOL;  Surgeon: [name], MD;  Location: [facility] ENDOSCOPY;  Service: Gastroenterology;  Laterality: N/A;   ESOPHAGOGASTRODUODENOSCOPY (EGD) WITH PROPOFOL N/A 03/23/2023   Procedure: ESOPHAGOGASTRODUODENOSCOPY (EGD) WITH PROPOFOL;  Surgeon: [name], MD;  Location: [facility] ENDOSCOPY;  Service: Gastroenterology;  Laterality: N/A;   RIGHT/LEFT HEART CATH AND CORONARY ANGIOGRAPHY Bilateral 04/14/2021   Procedure: RIGHT/LEFT HEART CATH AND CORONARY ANGIOGRAPHY;  Surgeon: Yvonne Kendall, MD;  Location: [facility] INVASIVE CV LAB;  Service: Cardiovascular;  Laterality: Bilateral;   TOE SURGERY Left 02/07/2016   Pinky Toe    Social History:  reports that she has never smoked. She has never used smokeless tobacco. She reports that she does not drink alcohol and does not use drugs. Family History:  Family History  Problem Relation Age of Onset   Diabetes Mother    Ulcers Mother    Heart disease Father    AAA (abdominal aortic aneurysm) Father    Diabetes Father    Hypertension Father    Stroke Father    Alzheimer's disease Father    Heart attack Sister    Seizures Brother    Diabetes Maternal Grandmother    Breast cancer Maternal Grandmother      HOME MEDICATIONS: Allergies as of 06/21/2023   No Known Allergies      Medication List        Accurate as of June 21, 2023  8:13 AM. If you have any questions, ask your nurse or doctor.          Accu-Chek Guide test strip Generic drug: glucose blood Check blood sugar 2 times daily as needed   Accu-Chek Guide w/Device Kit Use to check blood sugar 2 time daily as needed   accu-chek multiclix lancets Check sugar 2 times daily as  needed.   albuterol 108 (90 Base) MCG/ACT inhaler Commonly known as: VENTOLIN HFA TAKE 2 PUFFS BY MOUTH EVERY 6 HOURS AS NEEDED FOR WHEEZE OR SHORTNESS OF BREATH   amLODipine 5 MG tablet Commonly known as: NORVASC TAKE 1 TABLET (5 MG TOTAL) BY MOUTH DAILY.   atorvastatin 80 MG tablet Commonly known as: LIPITOR TAKE 1 TABLET BY MOUTH EVERY DAY   BASAGLAR KWIKPEN [place] Inject 90 Units into the skin daily.   carvedilol 12.5 MG tablet Commonly known as: COREG Take 1 tablet (12.5 mg total) by mouth 2 (two) times daily.   Dexcom G7 Sensor Misc Use as directed. Apply 1 sensor every 10 days   Emgality 120 MG/ML Soaj Generic drug: Galcanezumab-gnlm Inject 120-240 mg into the skin every 30 (thirty) days.   Entresto 24-26 MG Generic drug: sacubitril-valsartan Take 1 tablet by mouth 2 (two) times daily.   ezetimibe 10 MG tablet Commonly  known as: ZETIA Take 10 mg by mouth daily.   Insulin Pen Needle 32G X 4 MM Misc 1 Device by Does not apply route in the morning, at noon, in the evening, and at bedtime.   Jardiance 25 MG Tabs tablet Generic drug: empagliflozin Take 1 tablet (25 mg total) by mouth daily.   nitroGLYCERIN 0.4 MG SL tablet Commonly known as: NITROSTAT PLACE 1 TABLET UNDER THE TONGUE EVERY 5 MINUTES AS NEEDED FOR CHEST PAIN.   NovoLOG FlexPen 100 UNIT/ML FlexPen Generic drug: insulin aspart Max daily 30 units   omeprazole 40 MG capsule Commonly known as: PRILOSEC TAKE 1 CAPSULE (40 MG TOTAL) BY MOUTH 2 (TWO) TIMES DAILY BEFORE A MEAL.   ondansetron 4 MG tablet Commonly known as: Zofran Take 1 tablet (4 mg total) by mouth every 8 (eight) hours as needed for nausea or vomiting.   Semaglutide(0.25 or 0.5MG /DOS) 2 MG/3ML Sopn Inject 0.5 mg into the skin once a week.   spironolactone 25 MG tablet Commonly known as: ALDACTONE Take 1 tablet (25 mg total) by mouth daily.   sucralfate 1 GM/10ML suspension Commonly known as: CARAFATE Take 10 mLs (1 g total) by  mouth 4 (four) times daily. What changed:  when to take this reasons to take this   triamcinolone cream 0.1 % Commonly known as: KENALOG Apply 1 Application topically 2 (two) times daily.         ALLERGIES: No Known Allergies   REVIEW OF SYSTEMS: A comprehensive ROS was conducted with the patient and is negative except as per HPI    OBJECTIVE:   VITAL SIGNS: BP 132/78 (BP Location: Left Arm, Patient Position: Sitting, Cuff Size: Small)   Pulse 66   Ht 5\' 9"  (1.753 m)   Wt 181 lb (82.1 kg)   LMP  (LMP Unknown)   SpO2 99%   BMI 26.73 kg/m    PHYSICAL EXAM:  General: Pt appears well and is in NAD  Neck: General: Supple without adenopathy or carotid bruits. Thyroid: Thyroid size normal.  No goiter or nodules appreciated.   Lungs: Clear with good BS bilat with no rales, rhonchi, or wheezes  Heart: RRR   Abdomen:  soft, nontender  Extremities:  Lower extremities - No pretibial edema.   Neuro: MS is good with appropriate affect, pt is alert and Ox3    DM foot exam: 06/21/2023  The skin of the feet is intact without sores or ulcerations. The pedal pulses are 2+ on right and 2+ on left. The sensation is absent to a screening 5.07, 10 gram monofilament bilaterally   DATA REVIEWED:  Lab Results  Component Value Date   HGBA1C 8.4 (A) 06/21/2023   HGBA1C 8.6 (A) 03/04/2023   HGBA1C 8.5 (A) 10/29/2022    Latest Reference Range & Units 03/09/23 13:55  Sodium 135 - 146 mmol/L 140  Potassium 3.5 - 5.3 mmol/L 4.1  Chloride 98 - 110 mmol/L 108  CO2 20 - 32 mmol/L 24  Glucose 65 - 99 mg/dL 409 (H)  BUN 7 - 25 mg/dL 18  Creatinine 8.11 - 9.14 mg/dL 7.82  Calcium 8.6 - 95.6 mg/dL 9.1  BUN/Creatinine Ratio 6 - 22 (calc) SEE NOTE:  eGFR > OR = 60 mL/min/1.16m2 73  AG Ratio 1.0 - 2.5 (calc) 1.7  AST 10 - 35 U/L 15  ALT 6 - 29 U/L 18  Total Protein 6.1 - 8.1 g/dL 6.9  Total Bilirubin 0.2 - 1.2 mg/dL 1.2  Total CHOL/HDL Ratio <5.0 (calc)  3.1  Cholesterol <200 mg/dL 161   HDL Cholesterol > OR = 50 mg/dL 45 (L)  LDL Cholesterol (Calc) mg/dL (calc) 79  MICROALB/CREAT RATIO <30 mg/g creat 67 (H)  Non-HDL Cholesterol (Calc) <130 mg/dL (calc) 93  Triglycerides <150 mg/dL 64  (H): Data is abnormally high (L): Data is abnormally low  ASSESSMENT / PLAN / RECOMMENDATIONS:   1) Type 2 Diabetes Mellitus, Poorly  controlled, With retinopathic, neuropathic, microalbuminuria  and macrovascular   complications - Most recent A1c of 8.4 %. Goal A1c < 7.0 %.    Patient continues with hyperglycemia  She has been noted with hypoglycemia overnight, will decrease Basaglar as below  She is tolerating Ozempic, will increase dose as below  She has been inconsistent with taking NovoLog per correction scale , she was encouraged again to use correction scale before meals , for example, last night she had a BG reading of 350 mg/DL, patient stated she" only" had Pepsi and corn chips, we discussed that both of these items are high in starch, hence BG reading 350 Mg/DL, referral to our CDE has been placed for education regarding carbohydrate  MEDICATIONS: Decrease Basaglar 70 units daily Increase Ozempic 0.5 mg weekly Continue Jardiance 25 mg daily Continue  NovoLog Per correction factor : BG-120/30) TIDQAC  EDUCATION / INSTRUCTIONS: BG monitoring instructions: Patient is instructed to check her blood sugars 3 times a day, before each meal Call  Endocrinology clinic if: BG persistently < 70  I reviewed the Rule of 15 for the treatment of hypoglycemia in detail with the patient. Literature supplied.   2) Diabetic complications:  Eye: Does  have known diabetic retinopathy.  Neuro/ Feet: Does  have known diabetic peripheral neuropathy. Renal: Patient does not have known baseline CKD. She is  on an ACEI/ARB at present.       F/U in 4 months    Signed electronically by: Lyndle Herrlich, MD  [facility] Endocrinology  [facility] [address] [place], [place] [address] Phone: [phone] FAX: [phone]   CC: [name], MD [address] [place] [address] Phone: [phone]  Fax: [phone]    Return to Endocrinology clinic as below: Future Appointments  Date Time Provider Department Center  06/22/2023  3:40 PM [name], MD [facility]  06/23/2023 10:30 AM Gus Puma J [facility] None  07/19/2023  2:30 PM Heidi Dach, RN [facility] None  08/04/2023  9:00 AM Delles, Jackelyn Poling, RPH-CPP [facility] None  08/09/2023  8:00 AM [name], MD [facility]  09/07/2023 10:10 AM Mackenzey Crownover, [name], MD [facility] None

## 2023-06-21 NOTE — Progress Notes (Unsigned)
Name: Sarah Duffy   MRN: [id number]    DOB: 1971/11/28   Date:06/22/2023       Progress Note  Subjective  Chief Complaint  Follow Up  HPI  Intractable migraine: she was seen in June twice with migraines, she was given sample of Emgality on 05/19/2023 but insurance did not cover her rx, she has been taking Tylenol every morning - wakes up with throbbing sensation on left temporal area about 4/10 take Tylenol and goes back to bed for about one hour and symptoms resolves, she is able to function most of the day but headache intensifies at night and has to take Excedrin migraine. Discussed importance of stopping all otc medication. We will try adding Pamelor and Ubrelvy prn and try PA for [facility] again. Offered to place referral to neurologist but she wants to see if stopping otc medications will improve symptoms.   Rash: she was seen month ago and we gave her Triamcinolone , lesions were initially on ankle and seems to be either atopic dermatitis or erythema granulare, it is now progressing up to her legs, she has a small dog . Rash is pruriginous , erythematous with raised borders but no central clearing, she also has areas of hyperpigmentation ( older lesions)   Patient Active Problem List   Diagnosis Date Noted   Contact dermatitis and eczema 06/08/2023   Dysphagia 03/23/2023   Asthma, well controlled 03/09/2023   Depression, major, in remission (HCC) 03/09/2023   Perennial allergic rhinitis with seasonal variation 03/09/2023   Type 2 diabetes mellitus with diabetic polyneuropathy, with long-term current use of insulin (HCC) 08/31/2022   Type 2 diabetes mellitus with retinopathy, with long-term current use of insulin (HCC) 08/31/2022   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(HCC) 08/31/2022   Hypertension 06/11/2022   Ischemic cardiomyopathy 06/11/2022   Coronary artery disease 06/11/2022   Chronic systolic heart failure (HCC) 06/11/2022   Angina pectoris associated with type 2  diabetes mellitus (HCC) 01/05/2022   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 edema associated with type 2 diabetes mellitus (HCC) 05/01/2021   Cardiomyopathy (HCC) 04/14/2021   Depression, major, recurrent, mild (HCC) 09/25/2019   History of 2019 novel coronavirus disease (COVID-19) 06/23/2019   Migraine headache without aura 01/14/2018   Type II diabetes mellitus (HCC) 05/22/2017   Diabetic retinopathy of left eye, with macular edema, with severe nonproliferative retinopathy, associated with type 2 diabetes mellitus 05/21/2016   Severe nonproliferative diabetic retinopathy of right eye without macular edema associated with type 2 diabetes mellitus (HCC) 05/21/2016   Hyperlipidemia LDL goal <70 03/10/2016   MI (mitral incompetence) 02/25/2016   Insomnia 02/25/2016   Anxiety 09/04/2014    Past Surgical History:  Procedure Laterality Date   CHOLECYSTECTOMY  1999   COLONOSCOPY WITH PROPOFOL N/A 03/25/2021   Procedure: COLONOSCOPY WITH PROPOFOL;  Surgeon: [name], MD;  Location: [facility] ENDOSCOPY;  Service: Gastroenterology;  Laterality: N/A;   COLONOSCOPY WITH PROPOFOL N/A 04/17/2021   Procedure: COLONOSCOPY WITH PROPOFOL;  Surgeon: [name], MD;  Location: [facility] ENDOSCOPY;  Service: Gastroenterology;  Laterality: N/A;   ESOPHAGOGASTRODUODENOSCOPY (EGD) WITH PROPOFOL N/A 03/25/2021   Procedure: ESOPHAGOGASTRODUODENOSCOPY (EGD) WITH PROPOFOL;  Surgeon: [name], MD;  Location: [facility] ENDOSCOPY;  Service: Gastroenterology;  Laterality: N/A;   ESOPHAGOGASTRODUODENOSCOPY (EGD) WITH PROPOFOL N/A 04/17/2021   Procedure: ESOPHAGOGASTRODUODENOSCOPY (EGD) WITH PROPOFOL;  Surgeon: [name], MD;  Location: [facility] ENDOSCOPY;  Service: Gastroenterology;  Laterality: N/A;   ESOPHAGOGASTRODUODENOSCOPY (EGD) WITH PROPOFOL N/A 03/23/2023   Procedure: ESOPHAGOGASTRODUODENOSCOPY (EGD)  WITH PROPOFOL;  Surgeon: [name], MD;  Location: [facility] ENDOSCOPY;  Service: Gastroenterology;  Laterality:  N/A;   RIGHT/LEFT HEART CATH AND CORONARY ANGIOGRAPHY Bilateral 04/14/2021   Procedure: RIGHT/LEFT HEART CATH AND CORONARY ANGIOGRAPHY;  Surgeon: Yvonne Kendall, MD;  Location: [facility] INVASIVE CV LAB;  Service: Cardiovascular;  Laterality: Bilateral;   TOE SURGERY Left 02/07/2016   Pinky Toe    Family History  Problem Relation Age of Onset   Diabetes Mother    Ulcers Mother    Heart disease Father    AAA (abdominal aortic aneurysm) Father    Diabetes Father    Hypertension Father    Stroke Father    Alzheimer's disease Father    Heart attack Sister    Seizures Brother    Diabetes Maternal Grandmother    Breast cancer Maternal Grandmother     Social History   Tobacco Use   Smoking status: Never   Smokeless tobacco: Never  Substance Use Topics   Alcohol use: No    Alcohol/week: 0.0 standard drinks of alcohol     Current Outpatient Medications:    albuterol (VENTOLIN HFA) 108 (90 Base) MCG/ACT inhaler, TAKE 2 PUFFS BY MOUTH EVERY 6 HOURS AS NEEDED FOR WHEEZE OR SHORTNESS OF BREATH, Disp: , Rfl:    amLODipine (NORVASC) 5 MG tablet, TAKE 1 TABLET (5 MG TOTAL) BY MOUTH DAILY., Disp: 30 tablet, Rfl: 1   atorvastatin (LIPITOR) 80 MG tablet, TAKE 1 TABLET BY MOUTH EVERY DAY, Disp: 90 tablet, Rfl: 0   Blood Glucose Monitoring Suppl (ACCU-CHEK GUIDE) w/Device KIT, Use to check blood sugar 2 time daily as needed, Disp: 1 kit, Rfl: 0   carvedilol (COREG) 12.5 MG tablet, Take 1 tablet (12.5 mg total) by mouth 2 (two) times daily., Disp: 180 tablet, Rfl: 3   Continuous Blood Gluc Sensor (DEXCOM G7 SENSOR) MISC, Use as directed. Apply 1 sensor every 10 days, Disp: , Rfl:    ENTRESTO 24-26 MG, Take 1 tablet by mouth 2 (two) times daily., Disp: 28 tablet, Rfl: 0   ezetimibe (ZETIA) 10 MG tablet, Take 10 mg by mouth daily., Disp: , Rfl:    Galcanezumab-gnlm (EMGALITY) 120 MG/ML SOAJ, Inject 120-240 mg into the skin every 30 (thirty) days., Disp: 2 mL, Rfl: 0   glucose blood (ACCU-CHEK GUIDE)  test strip, Check blood sugar 2 times daily as needed, Disp: 100 each, Rfl: 12   insulin aspart (NOVOLOG FLEXPEN) 100 UNIT/ML FlexPen, Max daily 30 units, Disp: 30 mL, Rfl: 3   Insulin Glargine (BASAGLAR KWIKPEN) 100 UNIT/ML, Inject 70 Units into the skin daily., Disp: 60 mL, Rfl: 3   Insulin Pen Needle 32G X 4 MM MISC, 1 Device by Does not apply route in the morning, at noon, in the evening, and at bedtime., Disp: 400 each, Rfl: 3   JARDIANCE 25 MG TABS tablet, Take 1 tablet (25 mg total) by mouth daily., Disp: 90 tablet, Rfl: 2   Lancets (ACCU-CHEK MULTICLIX) lancets, Check sugar 2 times daily as needed., Disp: 100 each, Rfl: 12   nitroGLYCERIN (NITROSTAT) 0.4 MG SL tablet, PLACE 1 TABLET UNDER THE TONGUE EVERY 5 MINUTES AS NEEDED FOR CHEST PAIN., Disp: 25 tablet, Rfl: 3   omeprazole (PRILOSEC) 40 MG capsule, TAKE 1 CAPSULE (40 MG TOTAL) BY MOUTH 2 (TWO) TIMES DAILY BEFORE A MEAL., Disp: 180 capsule, Rfl: 1   ondansetron (ZOFRAN) 4 MG tablet, Take 1 tablet (4 mg total) by mouth every 8 (eight) hours as needed for nausea or vomiting.,  Disp: 20 tablet, Rfl: 0   Rimegepant Sulfate (NURTEC) 75 MG TBDP, Take 1 tablet (75 mg total) by mouth daily as needed. Max of every other day prn, Disp: 16 tablet, Rfl: 0   Semaglutide,0.25 or 0.5MG /DOS, 2 MG/3ML SOPN, Inject 0.5 mg into the skin once a week., Disp: 3 mL, Rfl: 6   spironolactone (ALDACTONE) 25 MG tablet, Take 1 tablet (25 mg total) by mouth daily., Disp: 90 tablet, Rfl: 3   sucralfate (CARAFATE) 1 GM/10ML suspension, Take 10 mLs (1 g total) by mouth 4 (four) times daily. (Patient taking differently: Take 1 g by mouth 4 (four) times daily as needed.), Disp: 420 mL, Rfl: 1   triamcinolone cream (KENALOG) 0.1 %, Apply 1 Application topically 2 (two) times daily., Disp: 453.6 g, Rfl: 0  No Known Allergies  I personally reviewed active problem list, medication list, allergies, family history, social history, health maintenance with the patient/caregiver  today.   ROS  Ten systems reviewed and is negative except as mentioned in HPI   Objective  Vitals:   06/22/23 1520  BP: 116/70  Pulse: 83  Resp: 14  Temp: 97.8 F (36.6 C)  TempSrc: Oral  SpO2: 96%  Weight: 179 lb (81.2 kg)  Height: 5\' 9"  (1.753 m)    Body mass index is 26.43 kg/m.  Physical Exam  Constitutional: Patient appears well-developed and well-nourished.  No distress.  HEENT: head atraumatic, normocephalic, pupils equal and reactive to light, neck supple, throat within normal limits Cardiovascular: Normal rate, regular rhythm and normal heart sounds.  No murmur heard. No BLE edema. Pulmonary/Chest: Effort normal and breath sounds normal. No respiratory distress. Abdominal: Soft.  There is no tenderness. Neuro: no focal deficit  Rash: on legs, different sizes mostly round, erythematous with raised borders or hyperpigmented , size from a dime to tennis ball , dry , the older lesions seems dry and scaly , other smooth  Psychiatric: Patient has a normal mood and affect. behavior is normal. Judgment and thought content normal.   Recent Results (from the past 2160 hour(s))  POCT glycosylated hemoglobin (Hb A1C)     Status: Abnormal   Collection Time: 06/21/23  8:01 AM  Result Value Ref Range   Hemoglobin A1C 8.4 (A) 4.0 - 5.6 %   HbA1c POC (<> result, manual entry)     HbA1c, POC (prediabetic range)     HbA1c, POC (controlled diabetic range)      PHQ2/9:    06/22/2023    3:26 PM 06/07/2023   11:34 AM 05/19/2023    8:35 AM 05/18/2023    9:59 AM 03/09/2023    1:12 PM  Depression screen PHQ 2/9  Decreased Interest 0 0 0 0 0  Down, Depressed, Hopeless 0 0 0 0 0  PHQ - 2 Score 0 0 0 0 0  Altered sleeping 0 0 0 0 0  Tired, decreased energy 0 0 0 0 0  Change in appetite 0 0 0 0 0  Feeling bad or failure about yourself  0 0 0 0 0  Trouble concentrating 0 0 0 0 0  Moving slowly or fidgety/restless 0 0 0 0 0  Suicidal thoughts 0 0  0 0  PHQ-9 Score 0 0 0 0 0   Difficult doing work/chores  Not difficult at all       phq 9 is negative   Fall Risk:    06/22/2023    3:26 PM 06/07/2023   11:33 AM 05/19/2023  8:35 AM 03/09/2023    1:12 PM 02/26/2023    2:57 PM  Fall Risk   Falls in the past year? 0 0 0 0 0  Number falls in past yr:  0   0  Injury with Fall?  0   0  Risk for fall due to : No Fall Risks  No Fall Risks No Fall Risks   Follow up Falls prevention discussed  Falls prevention discussed Falls prevention discussed;Education provided;Falls evaluation completed       Assessment & Plan  1. Intractable migraine without aura and with status migrainosus  - Rimegepant Sulfate (NURTEC) 75 MG TBDP; Take 1 tablet (75 mg total) by mouth daily as needed. Max of every other day prn  Dispense: 16 tablet; Refill: 0   We also need to do PA for emgality , not able to get second dose yet, due date was 07/05  2. Rebound headache  Stop taking otc pain medications, taking tylenol every morning and excedrin migraine every night and likely having rebound headaches, needs to stop all otc pain medication, use cold compresses, drink more water and we will try adding Ubrelvy      3. Rash in adult  - terbinafine (LAMISIL) 250 MG tablet; Take 1 tablet (250 mg total) by mouth daily.  Dispense: 14 tablet; Refill: 0   It may be erythema granulare, we will try lamisil since did not respond to topical steroids and refer to dermatologist   Referral dermatologist

## 2023-06-22 ENCOUNTER — Ambulatory Visit: Payer: Medicaid Other | Admitting: Family Medicine

## 2023-06-22 ENCOUNTER — Telehealth: Payer: Self-pay

## 2023-06-22 ENCOUNTER — Encounter: Payer: Self-pay | Admitting: Family Medicine

## 2023-06-22 VITALS — BP 116/70 | HR 83 | Temp 97.8°F | Resp 14 | Ht 69.0 in | Wt 179.0 lb

## 2023-06-22 DIAGNOSIS — G43011 Migraine without aura, intractable, with status migrainosus: Secondary | ICD-10-CM

## 2023-06-22 DIAGNOSIS — R21 Rash and other nonspecific skin eruption: Secondary | ICD-10-CM | POA: Diagnosis not present

## 2023-06-22 DIAGNOSIS — G4440 Drug-induced headache, not elsewhere classified, not intractable: Secondary | ICD-10-CM

## 2023-06-22 MED ORDER — NORTRIPTYLINE HCL 10 MG PO CAPS
10.0000 mg | ORAL_CAPSULE | Freq: Every day | ORAL | 0 refills | Status: DC
Start: 2023-06-22 — End: 2023-07-16

## 2023-06-22 MED ORDER — NURTEC 75 MG PO TBDP
1.0000 | ORAL_TABLET | Freq: Every day | ORAL | 0 refills | Status: DC | PRN
Start: 2023-06-22 — End: 2024-07-05

## 2023-06-22 MED ORDER — TERBINAFINE HCL 250 MG PO TABS
250.0000 mg | ORAL_TABLET | Freq: Every day | ORAL | 0 refills | Status: DC
Start: 2023-06-22 — End: 2023-08-09

## 2023-06-22 NOTE — Telephone Encounter (Signed)
..   Medicaid Managed Care   Unsuccessful Outreach Note  06/22/2023 Name: Sarah Duffy MRN: [id number] DOB: 07/15/1971  Referred by: [name], MD Reason for referral : Appointment   Third unsuccessful telephone outreach was attempted today. The patient was referred to the case management team for assistance with care management and care coordination. The patient's primary care provider has been notified of our unsuccessful attempts to make or maintain contact with the patient. The care management team is pleased to engage with this patient at any time in the future should he/she be interested in assistance from the care management team.   Follow Up Plan: We have been unable to make contact with the patient for follow up. The care management team is available to follow up with the patient after provider conversation with the patient regarding recommendation for care management engagement and subsequent re-referral to the care management team.   Weston Settle Care Guide  [facility]aged  Care Guide [facility]  [phone]

## 2023-06-23 ENCOUNTER — Other Ambulatory Visit: Payer: Medicaid Other

## 2023-06-23 NOTE — Patient Instructions (Signed)
  Medicaid Managed Care   Unsuccessful Outreach Note  06/23/2023 Name: Sarah Duffy MRN: [id number] DOB: 07-21-1971  Referred by: [name], MD Reason for referral : High Risk Managed Medicaid (MM Social work unsuccessful telephone outreach )   Third unsuccessful telephone outreach was attempted today. The patient was referred to the case management team for assistance with care management and care coordination. The patient's primary care provider has been notified of our unsuccessful attempts to make or maintain contact with the patient. The care management team is pleased to engage with this patient at any time in the future should he/she be interested in assistance from the care management team.   Follow Up Plan: The patient has been provided with contact information for the care management team and has been advised to call with any health related questions or concerns.   Abelino Derrick, MHA [facility]  Managed [facility] Social Worker [phone]

## 2023-06-23 NOTE — Patient Outreach (Signed)
  Medicaid Managed Care   Unsuccessful Outreach Note  06/23/2023 Name: Sarah Duffy MRN: [id number] DOB: 12/30/1970  Referred by: [name], Krichna, MD Reason for referral : High Risk Managed Medicaid (MM Social work unsuccessful telephone outreach )   Third unsuccessful telephone outreach was attempted today. The patient was referred to the case management team for assistance with care management and care coordination. The patient's primary care provider has been notified of our unsuccessful attempts to make or maintain contact with the patient. The care management team is pleased to engage with this patient at any time in the future should he/she be interested in assistance from the care management team.   Follow Up Plan: The patient has been provided with contact information for the care management team and has been advised to call with any health related questions or concerns.   Elis Rawlinson, BSW, MHA [place]  Managed Medicaid Social Worker [phone]  

## 2023-06-24 ENCOUNTER — Other Ambulatory Visit (HOSPITAL_COMMUNITY): Payer: Self-pay

## 2023-06-29 ENCOUNTER — Telehealth: Payer: Self-pay | Admitting: Family Medicine

## 2023-06-29 ENCOUNTER — Other Ambulatory Visit (HOSPITAL_COMMUNITY): Payer: Self-pay

## 2023-06-29 NOTE — Telephone Encounter (Signed)
PA initiated through CoverMyMeds. 

## 2023-06-29 NOTE — Telephone Encounter (Signed)
Copied from CRM [id number]. Topic: General - Other >> Jun 29, 2023  1:51 PM Clide Dales wrote: Patient's insurance is requiring prior authorization for Rimegepant Sulfate (NURTEC) 75 MG TBDP. Please advise.

## 2023-07-03 ENCOUNTER — Emergency Department
Admission: EM | Admit: 2023-07-03 | Discharge: 2023-07-03 | Disposition: A | Discharge status: Home / Self Care | Payer: Medicaid Other | Attending: Emergency Medicine | Admitting: Emergency Medicine

## 2023-07-03 ENCOUNTER — Other Ambulatory Visit: Payer: Self-pay

## 2023-07-03 ENCOUNTER — Ambulatory Visit
Admission: EM | Admit: 2023-07-03 | Discharge: 2023-07-03 | Disposition: A | Discharge status: Home / Self Care | Payer: Medicaid Other | Source: Home / Self Care

## 2023-07-03 DIAGNOSIS — E119 Type 2 diabetes mellitus without complications: Secondary | ICD-10-CM | POA: Insufficient documentation

## 2023-07-03 DIAGNOSIS — I2510 Atherosclerotic heart disease of native coronary artery without angina pectoris: Secondary | ICD-10-CM | POA: Diagnosis not present

## 2023-07-03 DIAGNOSIS — I440 Atrioventricular block, first degree: Secondary | ICD-10-CM | POA: Diagnosis not present

## 2023-07-03 DIAGNOSIS — R9431 Abnormal electrocardiogram [ECG] [EKG]: Secondary | ICD-10-CM

## 2023-07-03 DIAGNOSIS — I951 Orthostatic hypotension: Secondary | ICD-10-CM | POA: Diagnosis not present

## 2023-07-03 DIAGNOSIS — J069 Acute upper respiratory infection, unspecified: Secondary | ICD-10-CM | POA: Diagnosis not present

## 2023-07-03 DIAGNOSIS — Z1152 Encounter for screening for COVID-19: Secondary | ICD-10-CM | POA: Diagnosis not present

## 2023-07-03 DIAGNOSIS — I5022 Chronic systolic (congestive) heart failure: Secondary | ICD-10-CM | POA: Insufficient documentation

## 2023-07-03 DIAGNOSIS — R5381 Other malaise: Secondary | ICD-10-CM | POA: Insufficient documentation

## 2023-07-03 DIAGNOSIS — R110 Nausea: Secondary | ICD-10-CM | POA: Diagnosis not present

## 2023-07-03 DIAGNOSIS — R42 Dizziness and giddiness: Secondary | ICD-10-CM | POA: Insufficient documentation

## 2023-07-03 DIAGNOSIS — I4430 Unspecified atrioventricular block: Secondary | ICD-10-CM | POA: Diagnosis not present

## 2023-07-03 DIAGNOSIS — M791 Myalgia, unspecified site: Secondary | ICD-10-CM | POA: Insufficient documentation

## 2023-07-03 DIAGNOSIS — B9789 Other viral agents as the cause of diseases classified elsewhere: Secondary | ICD-10-CM | POA: Diagnosis not present

## 2023-07-03 LAB — BASIC METABOLIC PANEL
Anion gap: 8 (ref 5–15)
BUN: 32 mg/dL — ABNORMAL HIGH (ref 6–20)
CO2: 20 mmol/L — ABNORMAL LOW (ref 22–32)
Calcium: 8.6 mg/dL — ABNORMAL LOW (ref 8.9–10.3)
Chloride: 106 mmol/L (ref 98–111)
Creatinine, Ser: 1.12 mg/dL — ABNORMAL HIGH (ref 0.44–1.00)
GFR, Estimated: 59 mL/min — ABNORMAL LOW (ref >60)
Glucose, Bld: 144 mg/dL — ABNORMAL HIGH (ref 70–99)
Potassium: 4.9 mmol/L (ref 3.5–5.1)
Sodium: 134 mmol/L — ABNORMAL LOW (ref 135–145)

## 2023-07-03 LAB — CBC WITH DIFFERENTIAL/PLATELET
Abs Immature Granulocytes: 0.03 10*3/uL (ref 0.00–0.07)
Basophils Absolute: 0 10*3/uL (ref 0.0–0.1)
Basophils Relative: 0 %
Eosinophils Absolute: 0.2 10*3/uL (ref 0.0–0.5)
Eosinophils Relative: 2 %
HCT: 31.7 % — ABNORMAL LOW (ref 36.0–46.0)
Hemoglobin: 10.7 g/dL — ABNORMAL LOW (ref 12.0–15.0)
Immature Granulocytes: 0 %
Lymphocytes Relative: 16 %
Lymphs Abs: 1.3 10*3/uL (ref 0.7–4.0)
MCH: 29.6 pg (ref 26.0–34.0)
MCHC: 33.8 g/dL (ref 30.0–36.0)
MCV: 87.6 fL (ref 80.0–100.0)
Monocytes Absolute: 0.7 10*3/uL (ref 0.1–1.0)
Monocytes Relative: 9 %
Neutro Abs: 6.1 10*3/uL (ref 1.7–7.7)
Neutrophils Relative %: 73 %
Platelets: 216 10*3/uL (ref 150–400)
RBC: 3.62 MIL/uL — ABNORMAL LOW (ref 3.87–5.11)
RDW: 13.1 % (ref 11.5–15.5)
WBC: 8.4 10*3/uL (ref 4.0–10.5)
nRBC: 0 % (ref 0.0–0.2)

## 2023-07-03 LAB — RESP PANEL BY RT-PCR (RSV, FLU A&B, COVID)  RVPGX2
Influenza A by PCR: NEGATIVE
Influenza B by PCR: NEGATIVE
Resp Syncytial Virus by PCR: NEGATIVE
SARS Coronavirus 2 by RT PCR: NEGATIVE

## 2023-07-03 MED ORDER — SODIUM CHLORIDE 0.9 % IV BOLUS
1000.0000 mL | Freq: Once | INTRAVENOUS | Status: AC
  Administered 2023-07-03: 1000 mL via INTRAVENOUS

## 2023-07-03 NOTE — ED Provider Notes (Signed)
[facility]    CSN: [id number] Arrival date & time: 07/03/23  1150      History   Chief Complaint Chief Complaint  Patient presents with   Nausea   Chills    HPI Sarah Duffy is a 52 y.o. female who presents with onset of nausea, hot feeling and little sweaty at 3 am. She got up at 5 am and checked her glucose was 105. Then would have bouts of nausea with feeling bad like she was going to pass out. Went to work bad had to leave since she was not feeling any better and her boss told her. She is very fatigued. Denies SOB, CP , body aches, HA, or lightheaded or dizzy. But she never vomited. She has been on the same dose of Ozempic for 2 months.     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 02/2021 Echo: EF 35-40%, glob HK, GrI DD, nl RV fxn, mildly dil LA, mod MR; b. 06/2022 Echo: EF 50-55%, no rwma, GrII DD, nl RV fxn, RVSP , mildly dil LA, mild MR, AoV sclerosis.   CKD (chronic kidney disease), stage III (HCC)    Complication of anesthesia 03/23/2023   Possible gastroparesis based off food in stomach during EGD   Coronary artery disease    a. 04/2021 Cath: LM nl, LAD 70p/m, 4m, 80d, LCX 62m, OM1 40, OM2 99 (fills via collats from OM1), RCA 60d, RPDA 75.   COVID-19 2021   Hyperlipidemia LDL goal <70    Hypertension    Ischemic cardiomyopathy    a. 02/2021 Echo: EF 35-40%, glob HK, GrI DD; b. 06/2022 Echo: EF 50-55%.   Moderate mitral regurgitation    a. 02/2021 Echo: Mod MR.   MRSA infection within last 3 months 02/25/2016   Osteomyelitis of foot (HCC) 08/26/2016   Type II diabetes mellitus [facility])     Patient Active Problem List   Diagnosis Date Noted   Contact dermatitis and eczema 06/08/2023   Dysphagia 03/23/2023   Asthma, well controlled 03/09/2023   Depression, major, in remission (HCC) 03/09/2023   Perennial allergic rhinitis with seasonal variation 03/09/2023   Type 2 diabetes  mellitus with diabetic polyneuropathy, with long-term current use of insulin (HCC) 08/31/2022   Type 2 diabetes mellitus with retinopathy, with long-term current use of insulin (HCC) 08/31/2022   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(HCC) 08/31/2022   Hypertension 06/11/2022   Ischemic cardiomyopathy 06/11/2022   Coronary artery disease 06/11/2022   Chronic systolic heart failure (HCC) 06/11/2022   Angina pectoris associated with type 2 diabetes mellitus (HCC) 01/05/2022   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 edema associated with type 2 diabetes mellitus (HCC) 05/01/2021   Cardiomyopathy (HCC) 04/14/2021   Depression, major, recurrent, mild (HCC) 09/25/2019   History of 2019 novel coronavirus disease (COVID-19) 06/23/2019   Migraine headache without aura 01/14/2018   Type II diabetes mellitus (HCC) 05/22/2017   Diabetic retinopathy of left eye, with macular edema, with severe nonproliferative retinopathy, associated with type 2 diabetes mellitus 05/21/2016   Severe nonproliferative diabetic retinopathy of right eye without macular edema associated with type 2 diabetes mellitus (HCC) 05/21/2016   Hyperlipidemia LDL goal <70 03/10/2016   MI (mitral incompetence) 02/25/2016   Insomnia 02/25/2016   Anxiety 09/04/2014    Past Surgical History:  Procedure Laterality Date   CHOLECYSTECTOMY  1999   COLONOSCOPY WITH PROPOFOL N/A 03/25/2021  Procedure: COLONOSCOPY WITH PROPOFOL;  Surgeon: [name], MD;  Location: [facility] ENDOSCOPY;  Service: Gastroenterology;  Laterality: N/A;   COLONOSCOPY WITH PROPOFOL N/A 04/17/2021   Procedure: COLONOSCOPY WITH PROPOFOL;  Surgeon: [name], MD;  Location: [facility] ENDOSCOPY;  Service: Gastroenterology;  Laterality: N/A;   ESOPHAGOGASTRODUODENOSCOPY (EGD) WITH PROPOFOL N/A 03/25/2021   Procedure: ESOPHAGOGASTRODUODENOSCOPY (EGD) WITH PROPOFOL;  Surgeon: [name], MD;  Location: [facility] ENDOSCOPY;  Service: Gastroenterology;  Laterality:  N/A;   ESOPHAGOGASTRODUODENOSCOPY (EGD) WITH PROPOFOL N/A 04/17/2021   Procedure: ESOPHAGOGASTRODUODENOSCOPY (EGD) WITH PROPOFOL;  Surgeon: [name], MD;  Location: [facility] ENDOSCOPY;  Service: Gastroenterology;  Laterality: N/A;   ESOPHAGOGASTRODUODENOSCOPY (EGD) WITH PROPOFOL N/A 03/23/2023   Procedure: ESOPHAGOGASTRODUODENOSCOPY (EGD) WITH PROPOFOL;  Surgeon: [name], MD;  Location: [facility] ENDOSCOPY;  Service: Gastroenterology;  Laterality: N/A;   RIGHT/LEFT HEART CATH AND CORONARY ANGIOGRAPHY Bilateral 04/14/2021   Procedure: RIGHT/LEFT HEART CATH AND CORONARY ANGIOGRAPHY;  Surgeon: Yvonne Kendall, MD;  Location: [facility] INVASIVE CV LAB;  Service: Cardiovascular;  Laterality: Bilateral;   TOE SURGERY Left 02/07/2016   Pinky Toe    OB History   No obstetric history on file.      Home Medications    Prior to Admission medications   Medication Sig Start Date End Date Taking? Authorizing Provider  albuterol (VENTOLIN HFA) 108 (90 Base) MCG/ACT inhaler TAKE 2 PUFFS BY MOUTH EVERY 6 HOURS AS NEEDED FOR WHEEZE OR SHORTNESS OF BREATH    [provider]  amLODipine (NORVASC) 5 MG tablet TAKE 1 TABLET (5 MG TOTAL) BY MOUTH DAILY. 05/31/23   [name], MD  atorvastatin (LIPITOR) 80 MG tablet TAKE 1 TABLET BY MOUTH EVERY DAY 04/20/23   [name], MD  Blood Glucose Monitoring Suppl (ACCU-CHEK GUIDE) w/Device KIT Use to check blood sugar 2 time daily as needed 03/24/23   [name], [name], MD  carvedilol (COREG) 12.5 MG tablet Take 1 tablet (12.5 mg total) by mouth 2 (two) times daily. 08/11/22   Creig Hines, NP  Continuous Blood Gluc Sensor (DEXCOM G7 SENSOR) MISC Use as directed. Apply 1 sensor every 10 days    [provider]  ENTRESTO 24-26 MG Take 1 tablet by mouth 2 (two) times daily. 03/29/23   [name], MD  ezetimibe (ZETIA) 10 MG tablet Take 10 mg by mouth daily. 03/24/23   [provider]  Galcanezumab-gnlm (EMGALITY) 120 MG/ML  SOAJ Inject 120-240 mg into the skin every 30 (thirty) days. 05/18/23   [name], MD  glucose blood (ACCU-CHEK GUIDE) test strip Check blood sugar 2 times daily as needed 03/24/23   [name], [name], MD  insulin aspart (NOVOLOG FLEXPEN) 100 UNIT/ML FlexPen Max daily 30 units 06/21/23   [name], [name], MD  Insulin Glargine (BASAGLAR KWIKPEN) 100 UNIT/ML Inject 70 Units into the skin daily. 06/21/23   [name], [name], MD  Insulin Pen Needle 32G X 4 MM MISC 1 Device by Does not apply route in the morning, at noon, in the evening, and at bedtime. 06/21/23   [name], [name], MD  JARDIANCE 25 MG TABS tablet Take 1 tablet (25 mg total) by mouth daily. 06/21/23   [name], [name], MD  Lancets (ACCU-CHEK MULTICLIX) lancets Check sugar 2 times daily as needed. 03/24/23   [name], [name], MD  nitroGLYCERIN (NITROSTAT) 0.4 MG SL tablet PLACE 1 TABLET UNDER THE TONGUE EVERY 5 MINUTES AS NEEDED FOR CHEST PAIN. 03/25/23   [name], MD  nortriptyline (PAMELOR) 10 MG capsule Take 1 capsule (  10 mg total) by mouth at bedtime. 06/22/23   [name], MD  omeprazole (PRILOSEC) 40 MG capsule TAKE 1 CAPSULE (40 MG TOTAL) BY MOUTH 2 (TWO) TIMES DAILY BEFORE A MEAL. 03/25/23   [name], MD  ondansetron (ZOFRAN) 4 MG tablet Take 1 tablet (4 mg total) by mouth every 8 (eight) hours as needed for nausea or vomiting. 05/18/23   [name], MD  Rimegepant Sulfate (NURTEC) 75 MG TBDP Take 1 tablet (75 mg total) by mouth daily as needed. Max of every other day prn 06/22/23   [name], MD  Semaglutide,0.25 or 0.5MG /DOS, 2 MG/3ML SOPN Inject 0.5 mg into the skin once a week. 06/21/23   [name], [name], MD  spironolactone (ALDACTONE) 25 MG tablet Take 1 tablet (25 mg total) by mouth daily. 10/12/22   [name], MD  sucralfate (CARAFATE) 1 GM/10ML suspension Take 10 mLs (1 g total) by mouth 4 (four) times daily. Patient taking  differently: Take 1 g by mouth 4 (four) times daily as needed. 03/03/23   [name], MD  terbinafine (LAMISIL) 250 MG tablet Take 1 tablet (250 mg total) by mouth daily. 06/22/23   [name], MD  triamcinolone cream (KENALOG) 0.1 % Apply 1 Application topically 2 (two) times daily. 03/09/23   [name], MD    Family History Family History  Problem Relation Age of Onset   Diabetes Mother    Ulcers Mother    Heart disease Father    AAA (abdominal aortic aneurysm) Father    Diabetes Father    Hypertension Father    Stroke Father    Alzheimer's disease Father    Heart attack Sister    Seizures Brother    Diabetes Maternal Grandmother    Breast cancer Maternal Grandmother     Social History Social History   Tobacco Use   Smoking status: Never   Smokeless tobacco: Never  Vaping Use   Vaping status: Never Used  Substance Use Topics   Alcohol use: No    Alcohol/week: 0.0 standard drinks of alcohol   Drug use: No     Allergies   Patient has no known allergies.   Review of Systems Review of Systems  As noted in HPI Physical Exam Triage Vital Signs ED Triage Vitals [07/03/23 1211]  Encounter Vitals Group     BP (!) 94/51     Systolic BP Percentile      Diastolic BP Percentile      Pulse Rate 67     Resp 14     Temp 97.8 F (36.6 C)     Temp Source Oral     SpO2 97 %     Weight      Height      Head Circumference      Peak Flow      Pain Score      Pain Loc      Pain Education      Exclude from Growth Chart    Orthostatic VS for the past 24 hrs:  BP- Lying Pulse- Lying BP- Sitting Pulse- Sitting BP- Standing at 0 minutes Pulse- Standing at 0 minutes  07/03/23 1230 100/58 67 99/57 67 (!) 77/47 73    Updated Vital Signs BP (!) 94/51 (BP Location: Right Arm)   Pulse 67   Temp 97.8 F (36.6 C) (Oral)   Resp 14   LMP  (LMP Unknown)   SpO2 97%   Visual Acuity Right Eye Distance:   Left  Eye Distance:   Bilateral Distance:    Right Eye  Near:   Left Eye Near:    Bilateral Near:     Physical Exam Physical Exam Vitals signs and nursing note reviewed.  Constitutional:      General: She is not in acute distress.    Appearance: Normal appearance. She is not ill-appearing, toxic-appearing or diaphoretic.  HENT:     Head: Normocephalic.     Right Ear: Tympanic membrane, ear canal and external ear normal.     Left Ear: Tympanic membrane, ear canal and external ear normal.     Nose: Nose normal.     Mouth/Throat:     Mouth: Mucous membranes are moist.  Eyes:     General: No scleral icterus.       Right eye: No discharge.        Left eye: No discharge.     Conjunctiva/sclera: Conjunctivae normal.  Neck:     Musculoskeletal: Neck supple. No neck rigidity.  Cardiovascular:     Rate and Rhythm: Normal rate and regular rhythm.     Heart sounds: No murmur.  Pulmonary:     Effort: Pulmonary effort is normal.     Breath sounds: Normal breath sounds.  Abdominal:     General: Bowel sounds are normal. There is no distension.     Palpations: Abdomen is soft. There is no mass.     Tenderness: There is no abdominal tenderness. There is no guarding or rebound.     Hernia: No hernia is present.  Musculoskeletal: Normal range of motion.  Lymphadenopathy:     Cervical: No cervical adenopathy.  Skin:    General: Skin is warm and dry.     Coloration: Skin is not jaundiced.     Findings: No rash.  Neurological:     Mental Status: She is alert and oriented to person, place, and time.     Gait: Gait normal.  Psychiatric:        Mood and Affect: Mood normal.        Behavior: Behavior normal.        Thought Content: Thought content normal.        Judgment: Judgment normal.    UC Treatments / Results  Labs (all labs ordered are listed, but only abnormal results are displayed) Labs Reviewed - No data to display  EKG There are changes in the AVL when compared to the [facility] from 1/'24  Radiology No results  found.  Procedures Procedures (including critical care time)  Medications Ordered in UC Medications - No data to display  Initial Impression / Assessment and Plan / UC Course  I have reviewed the triage vital signs and the nursing notes.  Pertinent labs & imaging results that were available during my care of the patient were reviewed by me and considered in my medical decision making (see chart for details).  Sudden nausea without vomiting Abnormal EKG Hx of DM Hx of Dm  I consulted this case with Dr Verlan Friends who concurred with me, to send her to ER via EMS due to abnormal EKG and hypotension.    Final Clinical Impressions(s) / UC Diagnoses   Final diagnoses:  Nausea without vomiting  Orthostatic hypotension  Nonspecific abnormal electrocardiogram (ECG) (EKG)     Discharge Instructions      Go to the ER right now      ED Prescriptions   None    PDMP not reviewed this encounter.   Rodriguez-Southworth, Nettie Elm,  PA-C 07/03/23 1438

## 2023-07-03 NOTE — ED Triage Notes (Signed)
Pt to ed from Urgent Care via [facility] for flu like symptoms. Pt states : I Just feel bad." Pt denies vomiting, denies Cp, denies SOB. Pt is caox4, in no acute distress and in a wheel chair in triage. Pt has all over bodyaches but denies any fevers.

## 2023-07-03 NOTE — Discharge Instructions (Signed)
Go to the ER right now

## 2023-07-03 NOTE — ED Triage Notes (Signed)
Pt c/o chills and episodes of heat, and nausea, the patient denies chest pain and denies SHOB.   Patient states this morning her CBG was 105.

## 2023-07-03 NOTE — ED Provider Notes (Signed)
[facility] Provider Note    Event Date/Time   First MD Initiated Contact with Patient 07/03/23 1553     (approximate)   History   Flu Like Symptoms   HPI  Sarah Duffy is a 52 y.o. female with PMH of diabetes, CAD, ischemic cardiomyopathy and chronic systolic heart failure among other chronic conditions presents for evaluation of flulike symptoms.  Patient states her symptoms began this morning with general malaise and bodyaches.  She went to work and proceeded to feel worse she developed some nausea but did not vomit.  She also reports having some dizzy spells and feeling like she was going to pass out.  She was seen by urgent care who advised her to come to the ED for further workup given her significant cardiac history and hypotension.     Physical Exam   Triage Vital Signs: ED Triage Vitals [07/03/23 1500]  Encounter Vitals Group     BP 99/68     Systolic BP Percentile      Diastolic BP Percentile      Pulse Rate 75     Resp 16     Temp 98 F (36.7 C)     Temp Source Oral     SpO2 100 %     Weight 178 lb 9.2 oz (81 kg)     Height 5\' 9"  (1.753 m)     Head Circumference      Peak Flow      Pain Score 0     Pain Loc      Pain Education      Exclude from Growth Chart     Most recent vital signs: Vitals:   07/03/23 1500  BP: 99/68  Pulse: 75  Resp: 16  Temp: 98 F (36.7 C)  SpO2: 100%    General: Awake, no distress.  CV:  Good peripheral perfusion.  RRR. Resp:  Normal effort.  CTAB. Abd:  No distention.   ED Results / Procedures / Treatments   Labs (all labs ordered are listed, but only abnormal results are displayed) Labs Reviewed  CBC WITH DIFFERENTIAL/PLATELET - Abnormal; Notable for the following components:      Result Value   RBC 3.62 (*)    Hemoglobin 10.7 (*)    HCT 31.7 (*)    All other components within normal limits  BASIC METABOLIC PANEL - Abnormal; Notable for the following components:   Sodium 134 (*)     CO2 20 (*)    Glucose, Bld 144 (*)    BUN 32 (*)    Creatinine, Ser 1.12 (*)    Calcium 8.6 (*)    GFR, Estimated 59 (*)    All other components within normal limits  RESP PANEL BY RT-PCR (RSV, FLU A&B, COVID)  [id number]     EKG  EKG reviewed and interpreted by me:  NSR with left axis deviation. VR 66 bpm PR interval 192 MS QRS duration 100 MS    PROCEDURES:  Critical Care performed: No  Procedures   MEDICATIONS ORDERED IN ED: Medications  sodium chloride 0.9 % bolus 1,000 mL (1,000 mLs Intravenous New Bag/Given 07/03/23 1731)     IMPRESSION / MDM / ASSESSMENT AND PLAN / ED COURSE  I reviewed the triage vital signs and the nursing notes.                             52 year old female with  significant cardiac history presents for evaluation of flulike symptoms.  VSS in triage although her BP is lower than her normal.  Patient NAD on exam  Differential diagnosis includes, but is not limited to, flu, COVID, cardiac ischemia, cardiac arrhythmia.  Patient's presentation is most consistent with acute complicated illness / injury requiring diagnostic workup.  Given patient's cardiac history and reports of feeling lightheaded and dizzy I obtained an EKG to rule out a potential cardiac cause.  EKG showed NSR with LAD.  Patient's CBC notable for mild anemia.  BMP indicates patient is dehydrated.  Respiratory panel was negative.  Given patient's normal EKG and lack of chest pain and SOB I believe her dizziness is secondary to a viral illness and dehydration.  Patient was given 1 L of NS while in the ED.  I advised patient on symptomatic management.  She can follow-up with her PCP and cardiologist as needed.  Patient voiced understanding, all questions were answered and she was stable at discharge     FINAL CLINICAL IMPRESSION(S) / ED DIAGNOSES   Final diagnoses:  Viral URI     Rx / DC Orders   ED Discharge Orders     None        Note:  This document was prepared  using Dragon voice recognition software and may include unintentional dictation errors.   Cameron Ali, PA-C 07/03/23 1949    Jene Every, MD 07/05/23 1059

## 2023-07-03 NOTE — Discharge Instructions (Addendum)
Your EKG was normal today.  Your respiratory panel was negative.  I suspect you have a viral illness.  Make sure you stay well-hydrated.  You can take cold medication to manage your symptoms.  You can take Tylenol or ibuprofen as needed for body aches.  You can follow-up with your PCP as needed.  You can return to the ED with any new or worsening symptoms.

## 2023-07-03 NOTE — ED Notes (Signed)
Pt to ER via EMS from urgent care sent for evaluation of EKG changes.  States she presented to UC with c/o not feeling well and nausea which has subsided.  UC states pt with orthostatic hypotension 94/51, EMS reports 121/70

## 2023-07-03 NOTE — ED Notes (Signed)
EMS activated per providers orders

## 2023-07-03 NOTE — ED Notes (Signed)
Patient is being discharged from the Urgent Care and sent to the Emergency Department via EMS . Per [facility] PA, patient is in need of higher level of care due to need for further evaluation . Patient is aware and verbalizes understanding of plan of care.  Vitals:   07/03/23 1211  BP: (!) 94/51  Pulse: 67  Resp: 14  Temp: 97.8 F (36.6 C)  SpO2: 97%

## 2023-07-16 ENCOUNTER — Other Ambulatory Visit: Payer: Self-pay | Admitting: Family Medicine

## 2023-07-16 DIAGNOSIS — G43011 Migraine without aura, intractable, with status migrainosus: Secondary | ICD-10-CM

## 2023-07-19 ENCOUNTER — Other Ambulatory Visit: Payer: Medicaid Other | Admitting: *Deleted

## 2023-07-19 ENCOUNTER — Encounter: Payer: Self-pay | Admitting: *Deleted

## 2023-07-19 NOTE — Patient Instructions (Signed)
Visit Information  Sarah Duffy was given information about Medicaid Managed Care team care coordination services as a part of their [facility] Community Plan Medicaid benefit. Sarah Duffy verbally consented to engagement with the [facility] Managed Care team.   If you are experiencing a medical emergency, please call 911 or report to your local emergency department or urgent care.   If you have a non-emergency medical problem during routine business hours, please contact your provider's office and ask to speak with a nurse.   For questions related to your [facility], please call: [phone] or visit the homepage here: [email or web address]  If you would like to schedule transportation through your [facility], please call the following number at least 2 days in advance of your appointment: [phone]   Rides for urgent appointments can also be made after hours by calling Member Services.  Call the Behavioral Health Crisis Line at [phone], at any time, 24 hours a day, 7 days a week. If you are in danger or need immediate medical attention call 911.  If you would like help to quit smoking, call 1-800-QUIT-NOW (410-295-6394) OR Espaol: 1-855-Djelo-Ya (3-474-259-5638) o para ms informacin haga clic aqu or Text READY to 756-433 to register via text  Sarah Duffy,   Please see education materials related to DM provided by MyChart link.  Patient verbalizes understanding of instructions and care plan provided today and agrees to view in MyChart. Active MyChart status and patient understanding of how to access instructions and care plan via MyChart confirmed with patient.     Telephone follow up appointment with Managed Medicaid care management team member scheduled for:09/20/23 @ 1:15pm  Sarah Emms RN, BSN [place]  Managed [facility] RN Care  Coordinator [phone]   Following is a copy of your plan of care:  Care Plan : RN Care Manager Plan of Care  Updates made by Sarah Dach, RN since 07/19/2023 12:00 AM     Problem: Health Management needs related to DMII      Long-Range Goal: Development of Plan of Care to address Health Management needs related to DMII   Start Date: 04/13/2023  Expected End Date: 10/14/2023  Note:   Current Barriers:  Chronic Disease Management support and education needs related to DMII-Ms. Borders has joined a gym and is working to improve her diet. She has increased stress as she thinks about upcoming heart surgery. She has experienced increased migraines in the last few months.   RNCM Clinical Goal(s):  Patient will verbalize understanding of plan for management of DMII as evidenced by patient reports take all medications exactly as prescribed and will call provider for medication related questions as evidenced by patient reports    attend all scheduled medical appointments: Cardiology 07/23/23, 08/04/23 with Pharmacist and 08/09/23 with PCP as evidenced by provider documentation in EMR        continue to work with RN Care Manager and/or Social Worker to address care management and care coordination needs related to DMII as evidenced by adherence to CM Team Scheduled appointments     through collaboration with Medical illustrator, provider, and care team.   Interventions: Inter-disciplinary care team collaboration (see longitudinal plan of care) Evaluation of current treatment plan related to  self management and patient's adherence to plan as established by provider Provided therapeutic listening   Diabetes:  (Status: Goal on Track (progressing): YES.) Long Term Goal   Lab Results  Component Value Date  HGBA1C 8.4 (A) 06/21/2023   @ Assessed patient's understanding of A1c goal: <7% Provided education to patient about basic DM disease process; Reviewed prescribed diet with patient low  carb/MyPlate method, provided examples of food choices; Discussed plans with patient for ongoing care management follow up and provided patient with direct contact information for care management team;      Reviewed scheduled/upcoming provider appointments including: Cardiology 07/23/23, 08/04/23 with Pharmacist and 08/09/23 with PCP;         Assessed social determinant of health barriers;        Discussed healthy food choices and provided with education Advised patient to contact [facility] member services [phone] for member benefits   Patient Goals/Self-Care Activities: Take medications as prescribed   Attend all scheduled provider appointments Call provider office for new concerns or questions  drink 6 to 8 glasses of water each day fill half of plate with vegetables manage portion size

## 2023-07-19 NOTE — Patient Outreach (Signed)
Medicaid Managed Care   Nurse Care Manager Note  07/19/2023 Name:  Sarah Duffy MRN:  [id number] DOB:  Jan 26, 1971  Sarah Duffy is an 52 y.o. year old female who is a primary patient of [name], MD.  The [facility] Managed Care Coordination team was consulted for assistance with:    DMII  Sarah Duffy was given information about Medicaid Managed Care Coordination team services today. Sarah Duffy Patient agreed to services and verbal consent obtained.  Engaged with patient by telephone for follow up visit in response to provider referral for case management and/or care coordination services.   Assessments/Interventions:  Review of past medical history, allergies, medications, health status, including review of consultants reports, laboratory and other test data, was performed as part of comprehensive evaluation and provision of chronic care management services.  SDOH (Social Determinants of Health) assessments and interventions performed: SDOH Interventions    Flowsheet Row Patient Outreach Telephone from 05/24/2023 in [place] POPULATION HEALTH DEPARTMENT Patient Outreach Telephone from 04/13/2023 in [place] POPULATION HEALTH DEPARTMENT Patient Outreach Telephone from 04/05/2023 in [place] POPULATION HEALTH DEPARTMENT Office Visit from 04/29/2021 in [facility] Office Visit from 02/11/2021 in [facility]  SDOH Interventions       Housing Interventions -- -- -- Other (Comment)  [name] got some information from pre-op] --  Transportation Interventions -- -- Intervention Not Indicated -- --  Utilities Interventions -- Intervention Not Indicated -- -- --  Depression Interventions/Treatment  -- -- -- -- Medication  Stress Interventions Offered [employer], Provide Counseling -- [employer], Provide Counseling -- --       Care Plan  No Known Allergies  Medications Reviewed Today      Reviewed by Heidi Dach, RN (Registered Nurse) on 07/19/23 at 1432  Med List Status: <None>   Medication Order Taking? Sig Documenting Provider Last Dose Status Informant  albuterol (VENTOLIN HFA) 108 (90 Base) MCG/ACT inhaler [id number]  TAKE 2 PUFFS BY MOUTH EVERY 6 HOURS AS NEEDED FOR WHEEZE OR SHORTNESS OF BREATH [provider]  Active   amLODipine (NORVASC) 5 MG tablet [id number]  TAKE 1 TABLET (5 MG TOTAL) BY MOUTH DAILY. [name], MD  Active   atorvastatin (LIPITOR) 80 MG tablet [id number]  TAKE 1 TABLET BY MOUTH EVERY DAY [name], Danna Hefty, MD  Active   Blood Glucose Monitoring Suppl (ACCU-CHEK GUIDE) w/Device KIT [id number]  Use to check blood sugar 2 time daily as needed [name], [name], MD  Active   carvedilol (COREG) 12.5 MG tablet [id number]  Take 1 tablet (12.5 mg total) by mouth 2 (two) times daily. Creig Hines, NP  Active   Continuous Blood Gluc Sensor (DEXCOM G7 SENSOR) [place] [id number]  Use as directed. Apply 1 sensor every 10 days [provider]  Active   ENTRESTO 24-26 MG [id number]  Take 1 tablet by mouth 2 (two) times daily. [name], MD  Active   ezetimibe (ZETIA) 10 MG tablet [id number]  Take 10 mg by mouth daily. [provider]  Active   Galcanezumab-gnlm [facility]) 120 MG/ML SOAJ [id number]  Inject 120-240 mg into the skin every 30 (thirty) days. [name], MD  Active   glucose blood (ACCU-CHEK GUIDE) test strip [id number]  Check blood sugar 2 times daily as needed [name], [name], MD  Active   insulin aspart (NOVOLOG FLEXPEN) 100 UNIT/ML FlexPen [id number]  Max daily 30 units [name], [name], MD  Active   Insulin  Glargine (BASAGLAR KWIKPEN) 100 UNIT/ML [id number]  Inject 70 Units into the skin daily. [name], [name], MD  Active   Insulin Pen Needle 32G X 4 MM MISC [id number]  1 Device by Does not apply route in the morning, at noon, in the evening, and at bedtime.  [name], [name], MD  Active   JARDIANCE 25 MG TABS tablet [id number]  Take 1 tablet (25 mg total) by mouth daily. [name], [name], MD  Active   Lancets (ACCU-CHEK MULTICLIX) lancets [id number]  Check sugar 2 times daily as needed. [name], [name], MD  Active   nitroGLYCERIN (NITROSTAT) 0.4 MG SL tablet [id number]  PLACE 1 TABLET UNDER THE TONGUE EVERY 5 MINUTES AS NEEDED FOR CHEST PAIN. [name], MD  Active   nortriptyline (PAMELOR) 10 MG capsule [id number]  TAKE 1 CAPSULE BY MOUTH AT BEDTIME. [name], MD  Active   omeprazole (PRILOSEC) 40 MG capsule [id number]  TAKE 1 CAPSULE (40 MG TOTAL) BY MOUTH 2 (TWO) TIMES DAILY BEFORE A MEAL. [name], MD  Active   ondansetron [facility]) 4 MG tablet [id number]  Take 1 tablet (4 mg total) by mouth every 8 (eight) hours as needed for nausea or vomiting. [name], MD  Active   Rimegepant Sulfate (NURTEC) 75 MG TBDP [id number]  Take 1 tablet (75 mg total) by mouth daily as needed. Max of every other day prn [name], MD  Active   Semaglutide,0.25 or 0.5MG /DOS, 2 MG/3ML [name] [id number]  Inject 0.5 mg into the skin once a week. [name], [name], MD  Active   spironolactone (ALDACTONE) 25 MG tablet [id number]  Take 1 tablet (25 mg total) by mouth daily. [name], MD  Active   sucralfate (CARAFATE) 1 GM/10ML suspension [id number]  Take 10 mLs (1 g total) by mouth 4 (four) times daily.  Patient taking differently: Take 1 g by mouth 4 (four) times daily as needed.   [name], MD  Active   terbinafine (LAMISIL) 250 MG tablet [id number]  Take 1 tablet (250 mg total) by mouth daily. [name], MD  Active   triamcinolone cream (KENALOG) 0.1 % [id number]  Apply 1 Application topically 2 (two) times daily. [name], MD  Active             Patient Active Problem List   Diagnosis Date Noted   Contact dermatitis and eczema 06/08/2023   Dysphagia 03/23/2023   Asthma, well  controlled 03/09/2023   Depression, major, in remission (HCC) 03/09/2023   Perennial allergic rhinitis with seasonal variation 03/09/2023   Type 2 diabetes mellitus with diabetic polyneuropathy, with long-term current use of insulin (HCC) 08/31/2022   Type 2 diabetes mellitus with retinopathy, with long-term current use of insulin (HCC) 08/31/2022   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(HCC) 08/31/2022   Hypertension 06/11/2022   Ischemic cardiomyopathy 06/11/2022   Coronary artery disease 06/11/2022   Chronic systolic heart failure (HCC) 06/11/2022   Angina pectoris associated with type 2 diabetes mellitus (HCC) 01/05/2022   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 edema associated with type 2 diabetes mellitus (HCC) 05/01/2021   Cardiomyopathy (HCC) 04/14/2021   Depression, major, recurrent, mild (HCC) 09/25/2019   History of 2019 novel coronavirus disease (COVID-19) 06/23/2019   Migraine headache without aura 01/14/2018   Type II diabetes mellitus (HCC) 05/22/2017   Diabetic retinopathy of left eye, with macular edema, with severe nonproliferative retinopathy, associated with type 2 diabetes mellitus 05/21/2016   Severe nonproliferative diabetic retinopathy of right eye  without macular edema associated with type 2 diabetes mellitus (HCC) 05/21/2016   Hyperlipidemia LDL goal <70 03/10/2016   MI (mitral incompetence) 02/25/2016   Insomnia 02/25/2016   Anxiety 09/04/2014    Conditions to be addressed/monitored per PCP order:  DMII  Care Plan : RN Care Manager Plan of Care  Updates made by Heidi Dach, RN since 07/19/2023 12:00 AM     Problem: Health Management needs related to DMII      Long-Range Goal: Development of Plan of Care to address Health Management needs related to DMII   Start Date: 04/13/2023  Expected End Date: 10/14/2023  Note:   Current Barriers:  Chronic Disease Management support and education needs related to DMII-Sarah Duffy has  joined a gym and is working to improve her diet. She has increased stress as she thinks about upcoming heart surgery. She has experienced increased migraines in the last few months.   RNCM Clinical Goal(s):  Patient will verbalize understanding of plan for management of DMII as evidenced by patient reports take all medications exactly as prescribed and will call provider for medication related questions as evidenced by patient reports    attend all scheduled medical appointments: Cardiology 07/23/23, 08/04/23 with Pharmacist and 08/09/23 with PCP as evidenced by provider documentation in EMR        continue to work with RN Care Manager and/or Social Worker to address care management and care coordination needs related to DMII as evidenced by adherence to CM Team Scheduled appointments     through collaboration with Medical illustrator, provider, and care team.   Interventions: Inter-disciplinary care team collaboration (see longitudinal plan of care) Evaluation of current treatment plan related to  self management and patient's adherence to plan as established by provider Provided therapeutic listening   Diabetes:  (Status: Goal on Track (progressing): YES.) Long Term Goal   Lab Results  Component Value Date   HGBA1C 8.4 (A) 06/21/2023   @ Assessed patient's understanding of A1c goal: <7% Provided education to patient about basic DM disease process; Reviewed prescribed diet with patient low carb/MyPlate method, provided examples of food choices; Discussed plans with patient for ongoing care management follow up and provided patient with direct contact information for care management team;      Reviewed scheduled/upcoming provider appointments including: Cardiology 07/23/23, 08/04/23 with Pharmacist and 08/09/23 with PCP;         Assessed social determinant of health barriers;        Discussed healthy food choices and provided with education Advised patient to contact [facility] member services [phone]  for member benefits   Patient Goals/Self-Care Activities: Take medications as prescribed   Attend all scheduled provider appointments Call provider office for new concerns or questions  drink 6 to 8 glasses of water each day fill half of plate with vegetables manage portion size       Follow Up:  Patient agrees to Care Plan and Follow-up.  Plan: The Managed Medicaid care management team will reach out to the patient again over the next 60 days.  Date/time of next scheduled RN care management/care coordination outreach:  09/20/23 @ 1:15pm  Estanislado Emms RN, BSN [place]  Managed [facility] RN Care Coordinator [phone]

## 2023-07-20 ENCOUNTER — Telehealth: Payer: Self-pay | Admitting: Cardiology

## 2023-07-20 DIAGNOSIS — F4322 Adjustment disorder with anxiety: Secondary | ICD-10-CM | POA: Diagnosis not present

## 2023-07-20 NOTE — Telephone Encounter (Signed)
Called patient, advised that she has been having issues with migraines that last few months, and since then they have improved but she states she has had a dull headache. At times, she states her heart feels funny, when I asked her to evaluate further. She states she did not have any chest pain, she does feel tired at times. Patient does not check her HR/BP at home- she was told she needed to get something to start checking this at home, but has not done this yet. Patient wanted to come in today to be evaluated, however I advised that we did not have any openings today, and if she was concerned with any symptoms she should go to urgent care. Patient aware, thankful for information given during call in regards to cardiac concerns.   Patient has upcoming appointment with Sheri,NP 08/09 she states she will wait for this appointment.

## 2023-07-20 NOTE — Telephone Encounter (Signed)
Patient would like to have a EKG done. She states that her heart feels funny and just want to make sure everything okay. Please advise

## 2023-07-23 ENCOUNTER — Ambulatory Visit: Payer: Medicaid Other | Attending: Cardiology | Admitting: Cardiology

## 2023-07-23 ENCOUNTER — Encounter: Payer: Self-pay | Admitting: Cardiology

## 2023-07-23 ENCOUNTER — Other Ambulatory Visit: Payer: Self-pay

## 2023-07-23 VITALS — BP 110/62 | HR 66 | Ht 69.0 in | Wt 174.0 lb

## 2023-07-23 DIAGNOSIS — E785 Hyperlipidemia, unspecified: Secondary | ICD-10-CM

## 2023-07-23 DIAGNOSIS — I5032 Chronic diastolic (congestive) heart failure: Secondary | ICD-10-CM | POA: Diagnosis not present

## 2023-07-23 DIAGNOSIS — E1165 Type 2 diabetes mellitus with hyperglycemia: Secondary | ICD-10-CM

## 2023-07-23 DIAGNOSIS — I2510 Atherosclerotic heart disease of native coronary artery without angina pectoris: Secondary | ICD-10-CM | POA: Diagnosis not present

## 2023-07-23 DIAGNOSIS — I10 Essential (primary) hypertension: Secondary | ICD-10-CM | POA: Diagnosis not present

## 2023-07-23 DIAGNOSIS — Z794 Long term (current) use of insulin: Secondary | ICD-10-CM | POA: Diagnosis not present

## 2023-07-23 DIAGNOSIS — I5022 Chronic systolic (congestive) heart failure: Secondary | ICD-10-CM | POA: Diagnosis not present

## 2023-07-23 DIAGNOSIS — I255 Ischemic cardiomyopathy: Secondary | ICD-10-CM

## 2023-07-23 MED ORDER — NITROGLYCERIN 0.4 MG SL SUBL: SUBLINGUAL_TABLET | SUBLINGUAL | 2 refills | Status: DC

## 2023-07-23 NOTE — Progress Notes (Signed)
Cardiology Office Note:  .   Date:  07/23/2023  ID:  Sarah Duffy, DOB [birth year], MRN [id number] PCP: [name], MD   HeartCare Providers Cardiologist:  [name], MD    History of Present Illness: .   Sarah Duffy is a 52 y.o. female with a past medical history of coronary artery disease, chronic heart failure with improved EF, ischemic cardiomyopathy, hypertension, hyperlipidemia, type 2 diabetes, CKD 3, and MRSA/osteomyelitis of the foot (2017), who presents for follow-up of her CAD HFimpEF.  She previously been evaluated in March with transient send chest discomfort concerning for the following the death of her mother.  Echo at that time revealed an EF of 35 to 20% with global hypokinesis, grade 1 diastolic dysfunction.  Diagnostic catheterization was performed and showed severe multivessel CAD.  She was referred to CT surgery and saw Dr. Micheal Likens in September 2022 with recommendation for CABG.  This was initially scheduled for October 2022, but was subsequently canceled secondary to start of illness.  Echo in July 2023 showed an EF of 50-55%, G2 DD, mild MR.  She was referred back to CT surgery in August 2023, and deferred proceeding with CABG due to change in her medical insurance.  She was last seen in clinic in 03/03/2023 at that time she stated she was doing well.  Her amlodipine was discontinued with plan to titrate Entresto in the future.  She had recently been diagnosed with flu and had recovered.  She has not yet made follow-up appointment with CT surgery because of ongoing issues with insurance.  There were no medication changes that were made.  She was scheduled for labs and encouraged to make her follow-up with CT surgery.  She returns to clinic today stating that from the cardiac standpoint she is doing fairly well. She denies any chest pain, shortness of breath, palpitations, or peripheral edema. She has been tolerating her medication well. She did receive a call  from the pharmacy questioning if she was able to stop her Zetia but she was unsure of the reason.(There has been no correspondence between the pharmacy and the office).  She did have 1 visit to the emergency department where she was diagnosed with a viral infection and was advised to take over-the-counter Tamiflu for the body aches and chills that she was having.  Since that time all of her symptoms have resolved and she has been doing well.  She does have questions today about a history of CKD she has never been told that she had any issues with her kidneys before.  She continues to work to determine.  She has made her appointment to follow-up with CT surgery.  ROS: 10 point review of systems has been reviewed and considered negative with exception of what is been listed in the HPI  Studies Reviewed: Marland Kitchen   EKG Interpretation Date/Time:  Friday July 23 2023 09:29:14 EDT Ventricular Rate:  66 PR Interval:  180 QRS Duration:  100 QT Interval:  390 QTC Calculation: 408 R Axis:   -55  Text Interpretation: Normal sinus rhythm Left anterior fascicular block When compared with ECG of 03-Jul-2023 16:32, No significant change was found Confirmed by Charlsie Quest ([id number]) on 07/23/2023 9:34:13 AM   TTE 06/23/22 1. Left ventricular ejection fraction, by estimation, is 50 to 55%. Left  ventricular ejection fraction by 3D volume is 52 %. Left ventricular  ejection fraction by PLAX is 53 %. The left ventricle has low normal  function. The left ventricle has  no  regional wall motion abnormalities. Left ventricular diastolic parameters  are consistent with Grade II diastolic dysfunction (pseudonormalization).  The average left ventricular global longitudinal strain is -18.9 %.   2. Right ventricular systolic function is normal. The right ventricular  size is normal. There is normal pulmonary artery systolic pressure. The  estimated right ventricular systolic pressure is 21.0 mmHg.   3. Left atrial size was  mildly dilated.   4. The mitral valve is normal in structure. Mild mitral valve  regurgitation. No evidence of mitral stenosis.   5. The aortic valve is normal in structure. Aortic valve regurgitation is  not visualized. Aortic valve sclerosis is present, with no evidence of  aortic valve stenosis.   6. The inferior vena cava is normal in size with greater than 50%  respiratory variability, suggesting right atrial pressure of 3 mmHg.   R/LHC 04/14/21 Conclusions: Multivessel coronary artery disease including mild to moderate diffuse plaquing of the LAD and RCA.  The mid/distal LAD demonstrates 3 focal lesions of up to 80% that are hemodynamically significant by iFR (iFR = 0.71).  There is also a 99% stenosis of OM2 with faint left to left collaterals supplying the distal branch as well as sequential 60% distal RCA and 70-80% proximal RPDA lesions. Upper normal to mildly elevated left heart filling pressure. Normal right heart and pulmonary artery pressures. Normal cardiac output/index.   Recommendations: In the setting of multivessel coronary artery disease, diabetes mellitus, and reduced LVEF that appears to be due to ischemic cardiomyopathy, consultation with cardiac surgery for consideration of CABG is recommended. Initiate aspirin 81 mg daily. Increase carvedilol to 12.5 mg twice daily with further escalation of goal-directed medical therapy for chronic HFrEF as tolerated. Aggressive secondary prevention of coronary artery disease.  TTE 03/12/21 1. Left ventricular ejection fraction, by estimation, is 35 to 40%. The  left ventricle has moderately decreased function. The left ventricle  demonstrates global hypokinesis. Left ventricular diastolic parameters are  consistent with Grade I diastolic  dysfunction (impaired relaxation). The average left ventricular global  longitudinal strain is -13.5 %. The global longitudinal strain is  abnormal.   2. Right ventricular systolic function is  normal. The right ventricular  size is normal.   3. Left atrial size was mildly dilated.   4. The mitral valve is normal in structure. Moderate mitral valve  regurgitation.  Risk Assessment/Calculations:             Physical Exam:   VS:  BP 110/62 (BP Location: Left Arm, Patient Position: Sitting, Cuff Size: Normal)   Pulse 66   Ht 5\' 9"  (1.753 m)   Wt 174 lb (78.9 kg)   LMP  (LMP Unknown)   SpO2 98%   BMI 25.70 kg/m    Wt Readings from Last 3 Encounters:  07/23/23 174 lb (78.9 kg)  07/03/23 178 lb 9.2 oz (81 kg)  06/22/23 179 lb (81.2 kg)    GEN: Well nourished, well developed in no acute distress NECK: No JVD; No carotid bruits CARDIAC: RRR, II/VI SEM without rubs or gallops RESPIRATORY:  Clear to auscultation without rales, wheezing or rhonchi  ABDOMEN: Soft, non-tender, non-distended EXTREMITIES:  No edema; No deformity   ASSESSMENT AND PLAN: .   Coronary artery disease status post diagnostic catheterization in May 2022 and significantly dysfunctional chest pain with severe diffuse multivessel coronary disease.  Previously was seen by CT surgery with recommendations for CABG.  She has not made a follow-up with CT surgery for  reevaluation.  She is hemodynamically stable without symptoms of decompensation.  She remains on aspirin, atorvastatin and 80 mg daily ezetimibe 10 mg daily as well as carvedilol 12.5 mg twice daily.  EKG today reveals sinus rhythm with a left anterior fascicular block without ischemic changes noted.  HFimpEF/ischemic cardiomyopathy with echocardiogram completed in 06/2022 revealed LVEF 50-55% mild mitral regurgitation.  She is euvolemic on examination is doing well at home.  She remains on carvedilol, Entresto, spironolactone, and Jardiance.  She has been sent for repeat BMP today.  Primary hypertension with blood pressure today 110/62.  Blood pressures remain stable on current medication regimen.  She has also been given a blood pressure cuff today to  continue to monitor blood pressures at home 1 to 2 hours post medication administration.  Mixed hyperlipidemia with LDL 79 03/09/2023.  She has been continued on atorvastatin 80 mg daily and ezetimibe 10 mg daily.  She states that she received a phone call from the pharmacy wanted to have her ezetimibe could be discontinued but she is unsure of the reason why.  Will need to reach out to pharmacy to determine what the rationale for the call was.  She has not had a goal of 55 or less she is continued on her current medication regimen.  With her follow-up coming up with CT surgery will defer potentially adding PCSK9 inhibitor therapy today.  Mitral regurgitation that was noted to be mild on echocardiogram 06/2022  CKD stage IIIa with a serum creatinine of 1.12 and a GFR 59.  She had several questions about this today and she has been sent for repeat BMP today.       Dispo: Patient return to clinic to see MD/APP in 3 months or sooner if needed.  Signed,  , NP

## 2023-07-23 NOTE — Telephone Encounter (Signed)
Requested Prescriptions   Signed Prescriptions Disp Refills   nitroGLYCERIN (NITROSTAT) 0.4 MG SL tablet 25 tablet 2    Sig: PLACE 1 TABLET UNDER THE TONGUE EVERY 5 MINUTES AS NEEDED FOR CHEST PAIN.    Authorizing Provider: [name]    Ordering User: Guerry Minors

## 2023-07-23 NOTE — Patient Instructions (Signed)
Medication Instructions:  No changes at this time.   *If you need a refill on your cardiac medications before your next appointment, please call your pharmacy*   Lab Work: [facility] today here in our office.   If you have labs (blood work) drawn today and your tests are completely normal, you will receive your results only by: MyChart Message (if you have MyChart) OR A paper copy in the mail If you have any lab test that is abnormal or we need to change your treatment, we will call you to review the results.   Testing/Procedures: None   Follow-Up: At [facility], you and your health needs are our priority.  As part of our continuing mission to provide you with exceptional heart care, we have created designated Provider Care Teams.  These Care Teams include your primary Cardiologist (physician) and Advanced Practice Providers (APPs -  Physician Assistants and Nurse Practitioners) who all work together to provide you with the care you need, when you need it.   Your next appointment:   3 month(s)  Provider:   [name], MD or Charlsie Quest, NP

## 2023-07-23 NOTE — Progress Notes (Signed)
BP cuff provided to patient.

## 2023-07-26 ENCOUNTER — Other Ambulatory Visit: Payer: Self-pay | Admitting: Family Medicine

## 2023-07-26 DIAGNOSIS — G43011 Migraine without aura, intractable, with status migrainosus: Secondary | ICD-10-CM

## 2023-07-27 ENCOUNTER — Other Ambulatory Visit: Payer: Self-pay

## 2023-07-27 DIAGNOSIS — G43011 Migraine without aura, intractable, with status migrainosus: Secondary | ICD-10-CM

## 2023-07-29 DIAGNOSIS — M533 Sacrococcygeal disorders, not elsewhere classified: Secondary | ICD-10-CM | POA: Insufficient documentation

## 2023-07-29 DIAGNOSIS — E119 Type 2 diabetes mellitus without complications: Secondary | ICD-10-CM | POA: Diagnosis not present

## 2023-07-29 DIAGNOSIS — M545 Low back pain, unspecified: Secondary | ICD-10-CM | POA: Diagnosis not present

## 2023-07-29 DIAGNOSIS — M47816 Spondylosis without myelopathy or radiculopathy, lumbar region: Secondary | ICD-10-CM | POA: Insufficient documentation

## 2023-07-29 DIAGNOSIS — M79645 Pain in left finger(s): Secondary | ICD-10-CM | POA: Insufficient documentation

## 2023-08-03 ENCOUNTER — Other Ambulatory Visit: Payer: Self-pay | Admitting: Family Medicine

## 2023-08-03 DIAGNOSIS — I2510 Atherosclerotic heart disease of native coronary artery without angina pectoris: Secondary | ICD-10-CM

## 2023-08-03 DIAGNOSIS — E785 Hyperlipidemia, unspecified: Secondary | ICD-10-CM

## 2023-08-04 ENCOUNTER — Other Ambulatory Visit: Payer: Medicaid Other | Admitting: Pharmacist

## 2023-08-04 ENCOUNTER — Telehealth: Payer: Self-pay | Admitting: Pharmacist

## 2023-08-04 NOTE — Progress Notes (Signed)
   Outreach Note  08/04/2023 Name: Genette Bunn MRN: [id number] DOB: 1971/04/05  Referred by: [name], MD  Received a voicemail message from patient requesting to cancel our appointment for today and reschedule for another time.  Follow Up Plan: Will collaborate with Care Guide to outreach to schedule follow up with me  Estelle Grumbles, PharmD, [facility] Clinical Pharmacist [facility] [phone]

## 2023-08-09 ENCOUNTER — Encounter: Payer: Self-pay | Admitting: Family Medicine

## 2023-08-09 ENCOUNTER — Ambulatory Visit: Payer: Medicaid Other | Admitting: Family Medicine

## 2023-08-09 ENCOUNTER — Other Ambulatory Visit: Payer: Self-pay

## 2023-08-09 VITALS — BP 106/64 | HR 96 | Temp 97.8°F | Resp 16 | Ht 69.0 in | Wt 166.2 lb

## 2023-08-09 DIAGNOSIS — E1159 Type 2 diabetes mellitus with other circulatory complications: Secondary | ICD-10-CM

## 2023-08-09 DIAGNOSIS — F325 Major depressive disorder, single episode, in full remission: Secondary | ICD-10-CM

## 2023-08-09 DIAGNOSIS — J4520 Mild intermittent asthma, uncomplicated: Secondary | ICD-10-CM

## 2023-08-09 DIAGNOSIS — M545 Low back pain, unspecified: Secondary | ICD-10-CM | POA: Diagnosis not present

## 2023-08-09 DIAGNOSIS — I2510 Atherosclerotic heart disease of native coronary artery without angina pectoris: Secondary | ICD-10-CM

## 2023-08-09 DIAGNOSIS — I2583 Coronary atherosclerosis due to lipid rich plaque: Secondary | ICD-10-CM

## 2023-08-09 DIAGNOSIS — I152 Hypertension secondary to endocrine disorders: Secondary | ICD-10-CM | POA: Diagnosis not present

## 2023-08-09 DIAGNOSIS — J3089 Other allergic rhinitis: Secondary | ICD-10-CM

## 2023-08-09 DIAGNOSIS — I5022 Chronic systolic (congestive) heart failure: Secondary | ICD-10-CM | POA: Diagnosis not present

## 2023-08-09 DIAGNOSIS — J302 Other seasonal allergic rhinitis: Secondary | ICD-10-CM

## 2023-08-09 DIAGNOSIS — G43009 Migraine without aura, not intractable, without status migrainosus: Secondary | ICD-10-CM | POA: Diagnosis not present

## 2023-08-09 DIAGNOSIS — E1169 Type 2 diabetes mellitus with other specified complication: Secondary | ICD-10-CM | POA: Diagnosis not present

## 2023-08-09 MED ORDER — ALBUTEROL SULFATE HFA 108 (90 BASE) MCG/ACT IN AERS
2.0000 | INHALATION_SPRAY | RESPIRATORY_TRACT | 0 refills | Status: DC | PRN
Start: 2023-08-09 — End: 2023-09-03

## 2023-08-09 MED ORDER — EMGALITY 120 MG/ML ~~LOC~~ SOAJ
120.0000 mg | SUBCUTANEOUS | 5 refills | Status: DC
Start: 2023-08-09 — End: 2023-08-25

## 2023-08-09 MED ORDER — AMLODIPINE BESYLATE 5 MG PO TABS
5.0000 mg | ORAL_TABLET | Freq: Every day | ORAL | 1 refills | Status: DC
Start: 2023-08-09 — End: 2024-03-07

## 2023-08-09 MED ORDER — LEVOCETIRIZINE DIHYDROCHLORIDE 5 MG PO TABS
5.0000 mg | ORAL_TABLET | Freq: Every day | ORAL | 1 refills | Status: DC
Start: 2023-08-09 — End: 2024-01-31

## 2023-08-09 MED ORDER — EZETIMIBE 10 MG PO TABS
10.0000 mg | ORAL_TABLET | Freq: Every day | ORAL | 1 refills | Status: DC
Start: 2023-08-09 — End: 2024-03-09

## 2023-08-09 MED ORDER — ENTRESTO 24-26 MG PO TABS: 1.0000 | ORAL_TABLET | Freq: Two times a day (BID) | ORAL | 2 refills | Status: DC

## 2023-08-09 MED ORDER — ATORVASTATIN CALCIUM 80 MG PO TABS
80.0000 mg | ORAL_TABLET | Freq: Every day | ORAL | 1 refills | Status: DC
Start: 2023-08-09 — End: 2024-03-09

## 2023-08-09 MED ORDER — NORTRIPTYLINE HCL 10 MG PO CAPS: 10.0000 mg | ORAL_CAPSULE | Freq: Every day | ORAL | 1 refills | Status: DC

## 2023-08-09 NOTE — Progress Notes (Signed)
Name: Sarah Duffy   MRN: [id number]    DOB: 10-21-1971   Date:08/09/2023       Progress Note  Subjective  Chief Complaint  Follow up  HPI  DMII: she is under the care of Dr. Lonzo Cloud in July and A1C was 8.4 % , she is currently taking Ozempic ( not on our records/advised her to contact Endo), Jardiance,Basaglar and Premeal insulin. She recently medrol dose pack due for back pain and glucose went really high. She has Dexcom and average is around 260 ( she could not open her app on the phone)  She has CAD, dyslipidemia and microalbuminuria. She is on statin therapy. Eye exam is up to date   Rash : initially just ankles and we treated as contact dermatitis, after that spread to her legs , we gave her lamisil , it is not as irritated but still present and has a spot on her neck, she was referred to dermatologist but still does not have an appointment, trying to be seen sooner  MDD:  She lost her mother unexpectedly on Feb 4 th , 20,, she reached out to a therapist and will start having sessions to get ready for open heart surgery.  She does not want to resume medications at this time . She is in remission, phq 9 is negative   HTN: bp is at goal today, she is taking medication as prescribed. Denies side effects   Dyspepsia: she is feeling much better now, she had an EGD but had a lot of food in her stomach and needs to go back.   Migraines: resolved with Emgality no episode in 2 months   Asthma mild intermittent/AR: she states having more symptoms lately , she is out of medications   Intermittent low back pain: going on since April but getting more often, it does not radiate down legs, described as dull and aching, no bowel or bladder incontinence. [facility] and took medrol dose pain, pain today is 3/10 . She works as a Psychologist, counselling systolic heart failure/Cardiomyopathy: under the care of Dr. Myriam Forehand, she went for second opinion at [facility] and also recommended CABG , she is  now planning on having surgery done by Dr. Laneta Simmers and waiting for insurance . She was ready to have CABG on 09/19/2021 but it was cancelled by the physicians due to personal problems followed by insurance problems, , now she has medicaid and the surgery will be covered.  She denies any recent chest pain, taking medication daily . She has ischemic cardiomyopathy but currently no symptoms. Denies orthopnea, PND or lower extremity edema she has some SOB with activity. She will go back to see surgeon Sep 11 th   Conclusions Cath :   Multivessel coronary artery disease including mild to moderate diffuse plaquing of the LAD and RCA.  The mid/distal LAD demonstrates 3 focal lesions of up to 80% that are hemodynamically significant by iFR (iFR = 0.71).  There is also a 99% stenosis of OM2 with faint left to left collaterals supplying the distal branch as well as sequential 60% distal RCA and 70-80% proximal RPDA lesions.   IMPRESSIONS  Echo done 06/23/2022 showed improvement   Left ventricular ejection fraction, by estimation, is 50 to 55%. Left ventricular ejection fraction by 3D volume is 52 %. Left ventricular ejection fraction by PLAX is 53 %. The left ventricle has low normal function. The left ventricle has no regional wall motion abnormalities. Left ventricular diastolic parameters  are consistent with Grade II diastolic dysfunction (pseudonormalization). The average left ventricular global longitudinal strain is -18.9 %. 1. Right ventricular systolic function is normal. The right ventricular size is normal. There is normal pulmonary artery systolic pressure. The estimated right ventricular systolic pressure is 21.0 mmHg. 2. 3. Left atrial size was mildly dilated. The mitral valve is normal in structure. Mild mitral valve regurgitation. No evidence of mitral stenosis. 4. The aortic valve is normal in structure. Aortic valve regurgitation is not visualized. Aortic valve sclerosis is present,  with no evidence of aortic valve stenosis. 5. The inferior vena cava is normal in size with greater than 50% respiratory variability, suggesting right atrial pressure of 3 mmHg. 6. Comparison(s): Previous GLS reported as -13.5 Previous LVEF reported as 35-40%.  Patient Active Problem List   Diagnosis Date Noted   Arthropathy of lumbar facet joint 07/29/2023   Low back pain 07/29/2023   Pain in finger of left hand 07/29/2023   Sacroiliac joint pain 07/29/2023   Contact dermatitis and eczema 06/08/2023   Dysphagia 03/23/2023   Asthma, well controlled 03/09/2023   Depression, major, in remission (HCC) 03/09/2023   Perennial allergic rhinitis with seasonal variation 03/09/2023   Type 2 diabetes mellitus with diabetic polyneuropathy, with long-term current use of insulin (HCC) 08/31/2022   Type 2 diabetes mellitus with retinopathy, with long-term current use of insulin (HCC) 08/31/2022   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(HCC) 08/31/2022   Hypertension 06/11/2022   Ischemic cardiomyopathy 06/11/2022   Coronary artery disease 06/11/2022   Chronic systolic heart failure (HCC) 06/11/2022   Angina pectoris associated with type 2 diabetes mellitus (HCC) 01/05/2022   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 edema associated with type 2 diabetes mellitus (HCC) 05/01/2021   Cardiomyopathy (HCC) 04/14/2021   Depression, major, recurrent, mild (HCC) 09/25/2019   History of 2019 novel coronavirus disease (COVID-19) 06/23/2019   Migraine headache without aura 01/14/2018   Type II diabetes mellitus (HCC) 05/22/2017   Diabetic retinopathy of left eye, with macular edema, with severe nonproliferative retinopathy, associated with type 2 diabetes mellitus 05/21/2016   Severe nonproliferative diabetic retinopathy of right eye without macular edema associated with type 2 diabetes mellitus (HCC) 05/21/2016   Hyperlipidemia LDL goal <70 03/10/2016   MI (mitral incompetence)  02/25/2016   Insomnia 02/25/2016   Anxiety 09/04/2014    Past Surgical History:  Procedure Laterality Date   CHOLECYSTECTOMY  1999   COLONOSCOPY WITH PROPOFOL N/A 03/25/2021   Procedure: COLONOSCOPY WITH PROPOFOL;  Surgeon: [name], MD;  Location: [facility] ENDOSCOPY;  Service: Gastroenterology;  Laterality: N/A;   COLONOSCOPY WITH PROPOFOL N/A 04/17/2021   Procedure: COLONOSCOPY WITH PROPOFOL;  Surgeon: [name], MD;  Location: [facility] ENDOSCOPY;  Service: Gastroenterology;  Laterality: N/A;   ESOPHAGOGASTRODUODENOSCOPY (EGD) WITH PROPOFOL N/A 03/25/2021   Procedure: ESOPHAGOGASTRODUODENOSCOPY (EGD) WITH PROPOFOL;  Surgeon: [name], MD;  Location: [facility] ENDOSCOPY;  Service: Gastroenterology;  Laterality: N/A;   ESOPHAGOGASTRODUODENOSCOPY (EGD) WITH PROPOFOL N/A 04/17/2021   Procedure: ESOPHAGOGASTRODUODENOSCOPY (EGD) WITH PROPOFOL;  Surgeon: [name], MD;  Location: [facility] ENDOSCOPY;  Service: Gastroenterology;  Laterality: N/A;   ESOPHAGOGASTRODUODENOSCOPY (EGD) WITH PROPOFOL N/A 03/23/2023   Procedure: ESOPHAGOGASTRODUODENOSCOPY (EGD) WITH PROPOFOL;  Surgeon: [name], MD;  Location: [facility] ENDOSCOPY;  Service: Gastroenterology;  Laterality: N/A;   RIGHT/LEFT HEART CATH AND CORONARY ANGIOGRAPHY Bilateral 04/14/2021   Procedure: RIGHT/LEFT HEART CATH AND CORONARY ANGIOGRAPHY;  Surgeon: Yvonne Kendall, MD;  Location: [facility] INVASIVE CV LAB;  Service: Cardiovascular;  Laterality: Bilateral;  TOE SURGERY Left 02/07/2016   Pinky Toe    Family History  Problem Relation Age of Onset   Diabetes Mother    Ulcers Mother    Heart disease Father    AAA (abdominal aortic aneurysm) Father    Diabetes Father    Hypertension Father    Stroke Father    Alzheimer's disease Father    Heart attack Sister    Seizures Brother    Diabetes Maternal Grandmother    Breast cancer Maternal Grandmother     Social History   Tobacco Use   Smoking status: Never   Smokeless tobacco: Never  Substance Use  Topics   Alcohol use: No    Alcohol/week: 0.0 standard drinks of alcohol     Current Outpatient Medications:    albuterol (VENTOLIN HFA) 108 (90 Base) MCG/ACT inhaler, TAKE 2 PUFFS BY MOUTH EVERY 6 HOURS AS NEEDED FOR WHEEZE OR SHORTNESS OF BREATH, Disp: , Rfl:    amLODipine (NORVASC) 5 MG tablet, TAKE 1 TABLET (5 MG TOTAL) BY MOUTH DAILY., Disp: 30 tablet, Rfl: 1   atorvastatin (LIPITOR) 80 MG tablet, TAKE 1 TABLET BY MOUTH EVERY DAY, Disp: 90 tablet, Rfl: 0   Blood Glucose Monitoring Suppl (ACCU-CHEK GUIDE) w/Device KIT, Use to check blood sugar 2 time daily as needed, Disp: 1 kit, Rfl: 0   carvedilol (COREG) 12.5 MG tablet, Take 1 tablet (12.5 mg total) by mouth 2 (two) times daily., Disp: 180 tablet, Rfl: 3   Continuous Blood Gluc Sensor (DEXCOM G7 SENSOR) MISC, Use as directed. Apply 1 sensor every 10 days, Disp: , Rfl:    ENTRESTO 24-26 MG, Take 1 tablet by mouth 2 (two) times daily., Disp: 28 tablet, Rfl: 0   Galcanezumab-gnlm (EMGALITY) 120 MG/ML SOAJ, INJECT 120-240 MG INTO THE SKIN EVERY 30 (THIRTY) DAYS., Disp: 1 mL, Rfl: 1   glucose blood (ACCU-CHEK GUIDE) test strip, Check blood sugar 2 times daily as needed, Disp: 100 each, Rfl: 12   insulin aspart (NOVOLOG FLEXPEN) 100 UNIT/ML FlexPen, Max daily 30 units, Disp: 30 mL, Rfl: 3   Insulin Glargine (BASAGLAR KWIKPEN) 100 UNIT/ML, Inject 70 Units into the skin daily., Disp: 60 mL, Rfl: 3   Insulin Pen Needle 32G X 4 MM MISC, 1 Device by Does not apply route in the morning, at noon, in the evening, and at bedtime., Disp: 400 each, Rfl: 3   JARDIANCE 25 MG TABS tablet, Take 1 tablet (25 mg total) by mouth daily., Disp: 90 tablet, Rfl: 2   Lancets (ACCU-CHEK MULTICLIX) lancets, Check sugar 2 times daily as needed., Disp: 100 each, Rfl: 12   levocetirizine (XYZAL) 5 MG tablet, SMARTSIG:1 Tablet(s) By Mouth Every Evening, Disp: , Rfl:    nitroGLYCERIN (NITROSTAT) 0.4 MG SL tablet, PLACE 1 TABLET UNDER THE TONGUE EVERY 5 MINUTES AS NEEDED  FOR CHEST PAIN., Disp: 25 tablet, Rfl: 2   nortriptyline (PAMELOR) 10 MG capsule, TAKE 1 CAPSULE BY MOUTH AT BEDTIME., Disp: 90 capsule, Rfl: 0   Rimegepant Sulfate (NURTEC) 75 MG TBDP, Take 1 tablet (75 mg total) by mouth daily as needed. Max of every other day prn, Disp: 16 tablet, Rfl: 0   spironolactone (ALDACTONE) 25 MG tablet, Take 1 tablet (25 mg total) by mouth daily., Disp: 90 tablet, Rfl: 3   triamcinolone cream (KENALOG) 0.1 %, Apply 1 Application topically 2 (two) times daily., Disp: 453.6 g, Rfl: 0   ezetimibe (ZETIA) 10 MG tablet, Take 10 mg by mouth daily. (Patient not taking: Reported  on 08/09/2023), Disp: , Rfl:    terbinafine (LAMISIL) 250 MG tablet, Take 1 tablet by mouth daily., Disp: , Rfl:   No Known Allergies  I personally reviewed active problem list, medication list, allergies with the patient/caregiver today.   ROS  Constitutional: Negative for fever or weight change.  Respiratory: Negative for cough and shortness of breath.   Cardiovascular: Negative for chest pain or palpitations.  Gastrointestinal: Negative for abdominal pain, no bowel changes.  Musculoskeletal: Negative for gait problem or joint swelling.  Skin: Negative for rash.  Neurological: Negative for dizziness or headache.  No other specific complaints in a complete review of systems (except as listed in HPI above).   Objective  Vitals:   08/09/23 0811 08/09/23 0812  BP: 106/64   Pulse: (!) 109 96  Resp: 16   Temp: 97.8 F (36.6 C)   TempSrc: Oral   SpO2: 99%   Weight: 166 lb 3.2 oz (75.4 kg)   Height: 5\' 9"  (1.753 m)     Body mass index is 24.54 kg/m.  Physical Exam  Constitutional: Patient appears well-developed and well-nourished. Obese  No distress.  HEENT: head atraumatic, normocephalic, pupils equal and reactive to light,, neck supple Cardiovascular: Normal rate, regular rhythm and normal heart sounds.  No murmur heard. No BLE edema. Pulmonary/Chest: Effort normal and breath  sounds normal. No respiratory distress. Skin: rash on legs and neck, dry, different shapes and sizes,  hyperpigmented and dry  Abdominal: Soft.  There is no tenderness. Psychiatric: Patient has a normal mood and affect. behavior is normal. Judgment and thought content normal.   Recent Results (from the past 2160 hour(s))  POCT glycosylated hemoglobin (Hb A1C)     Status: Abnormal   Collection Time: 06/21/23  8:01 AM  Result Value Ref Range   Hemoglobin A1C 8.4 (A) 4.0 - 5.6 %   HbA1c POC (<> result, manual entry)     HbA1c, POC (prediabetic range)     HbA1c, POC (controlled diabetic range)    Resp panel by RT-PCR (RSV, Flu A&B, Covid) Anterior Nasal Swab     Status: None   Collection Time: 07/03/23  3:02 PM   Specimen: Anterior Nasal Swab  Result Value Ref Range   SARS Coronavirus 2 by RT PCR NEGATIVE NEGATIVE    Comment: (NOTE) SARS-CoV-2 target nucleic acids are NOT DETECTED.  The SARS-CoV-2 RNA is generally detectable in upper respiratory specimens during the acute phase of infection. The lowest concentration of SARS-CoV-2 viral copies this assay can detect is 138 copies/mL. A negative result does not preclude SARS-Cov-2 infection and should not be used as the sole basis for treatment or other patient management decisions. A negative result may occur with  improper specimen collection/handling, submission of specimen other than nasopharyngeal swab, presence of viral mutation(s) within the areas targeted by this assay, and inadequate number of viral copies(<138 copies/mL). A negative result must be combined with clinical observations, patient history, and epidemiological information. The expected result is Negative.  Fact Sheet for Patients:  BloggerCourse.com  Fact Sheet for Healthcare Providers:  SeriousBroker.it  This test is no t yet approved or cleared by the [place] FDA and  has been authorized for detection  and/or diagnosis of SARS-CoV-2 by FDA under an Emergency Use Authorization (EUA). This EUA will remain  in effect (meaning this test can be used) for the duration of the COVID-19 declaration under Section 564(b)(1) of the Act, 21 U.S.C.section 360bbb-3(b)(1), unless the authorization is terminated  or revoked  sooner.       Influenza A by PCR NEGATIVE NEGATIVE   Influenza B by PCR NEGATIVE NEGATIVE    Comment: (NOTE) The Xpert Xpress SARS-CoV-2/FLU/RSV plus assay is intended as an aid in the diagnosis of influenza from Nasopharyngeal swab specimens and should not be used as a sole basis for treatment. Nasal washings and aspirates are unacceptable for Xpert Xpress SARS-CoV-2/FLU/RSV testing.  Fact Sheet for Patients: BloggerCourse.com  Fact Sheet for Healthcare Providers: SeriousBroker.it  This test is not yet approved or cleared by the [place] FDA and has been authorized for detection and/or diagnosis of SARS-CoV-2 by FDA under an Emergency Use Authorization (EUA). This EUA will remain in effect (meaning this test can be used) for the duration of the COVID-19 declaration under Section 564(b)(1) of the Act, 21 U.S.C. section 360bbb-3(b)(1), unless the authorization is terminated or revoked.     Resp Syncytial Virus by PCR NEGATIVE NEGATIVE    Comment: (NOTE) Fact Sheet for Patients: BloggerCourse.com  Fact Sheet for Healthcare Providers: SeriousBroker.it  This test is not yet approved or cleared by the [place] FDA and has been authorized for detection and/or diagnosis of SARS-CoV-2 by FDA under an Emergency Use Authorization (EUA). This EUA will remain in effect (meaning this test can be used) for the duration of the COVID-19 declaration under Section 564(b)(1) of the Act, 21 U.S.C. section 360bbb-3(b)(1), unless the authorization is terminated  or revoked.  Performed at [facility], [address] [place], [place] [address]   CBC with Differential     Status: Abnormal   Collection Time: 07/03/23  4:36 PM  Result Value Ref Range   WBC 8.4 4.0 - 10.5 K/uL   RBC 3.62 (L) 3.87 - 5.11 MIL/uL   Hemoglobin 10.7 (L) 12.0 - 15.0 g/dL   HCT 08.6 (L) 57.8 - 46.9 %   MCV 87.6 80.0 - 100.0 fL   MCH 29.6 26.0 - 34.0 pg   MCHC 33.8 30.0 - 36.0 g/dL   RDW 62.9 52.8 - 41.3 %   Platelets 216 150 - 400 K/uL   nRBC 0.0 0.0 - 0.2 %   Neutrophils Relative % 73 %   Neutro Abs 6.1 1.7 - 7.7 K/uL   Lymphocytes Relative 16 %   Lymphs Abs 1.3 0.7 - 4.0 K/uL   Monocytes Relative 9 %   Monocytes Absolute 0.7 0.1 - 1.0 K/uL   Eosinophils Relative 2 %   Eosinophils Absolute 0.2 0.0 - 0.5 K/uL   Basophils Relative 0 %   Basophils Absolute 0.0 0.0 - 0.1 K/uL   Immature Granulocytes 0 %   Abs Immature Granulocytes 0.03 0.00 - 0.07 K/uL    Comment: Performed at [facility], [address], [place], [place] [address]  Basic metabolic panel     Status: Abnormal   Collection Time: 07/03/23  4:36 PM  Result Value Ref Range   Sodium 134 (L) 135 - 145 mmol/L   Potassium 4.9 3.5 - 5.1 mmol/L   Chloride 106 98 - 111 mmol/L   CO2 20 (L) 22 - 32 mmol/L   Glucose, Bld 144 (H) 70 - 99 mg/dL    Comment: Glucose reference range applies only to samples taken after fasting for at least 8 hours.   BUN 32 (H) 6 - 20 mg/dL   Creatinine, Ser 0.27 (H) 0.44 - 1.00 mg/dL   Calcium 8.6 (L) 8.9 - 10.3 mg/dL   GFR, Estimated 59 (L) >60 mL/min    Comment: (  NOTE) Calculated using the CKD-EPI Creatinine Equation (2021)    Anion gap 8 5 - 15    Comment: Performed at [facility], [address] [place], [place] [address]  Basic metabolic panel     Status: Abnormal   Collection Time: 07/23/23 10:26 AM  Result Value Ref Range   Glucose 224 (H) 70 - 99 mg/dL   BUN 27 (H) 6 - 24 mg/dL   Creatinine, Ser 4.25 (H) 0.57 - 1.00 mg/dL   eGFR  57 (L) >95 GL/OVF/6.43   BUN/Creatinine Ratio 23 9 - 23   Sodium 137 134 - 144 mmol/L   Potassium 4.7 3.5 - 5.2 mmol/L   Chloride 104 96 - 106 mmol/L   CO2 22 20 - 29 mmol/L   Calcium 9.6 8.7 - 10.2 mg/dL      PIR5/1:    8/84/1660    8:08 AM 06/22/2023    3:26 PM 06/07/2023   11:34 AM 05/19/2023    8:35 AM 05/18/2023    9:59 AM  Depression screen PHQ 2/9  Decreased Interest 0 0 0 0 0  Down, Depressed, Hopeless 0 0 0 0 0  PHQ - 2 Score 0 0 0 0 0  Altered sleeping 0 0 0 0 0  Tired, decreased energy 0 0 0 0 0  Change in appetite 0 0 0 0 0  Feeling bad or failure about yourself  0 0 0 0 0  Trouble concentrating 0 0 0 0 0  Moving slowly or fidgety/restless 0 0 0 0 0  Suicidal thoughts 0 0 0  0  PHQ-9 Score 0 0 0 0 0  Difficult doing work/chores   Not difficult at all      phq 9 is negative   Fall Risk:    08/09/2023    8:08 AM 06/22/2023    3:26 PM 06/07/2023   11:33 AM 05/19/2023    8:35 AM 03/09/2023    1:12 PM  Fall Risk   Falls in the past year? 0 0 0 0 0  Number falls in past yr:   0    Injury with Fall?   0    Risk for fall due to : No Fall Risks No Fall Risks  No Fall Risks No Fall Risks  Follow up Falls prevention discussed Falls prevention discussed  Falls prevention discussed Falls prevention discussed;Education provided;Falls evaluation completed      Functional Status Survey: Is the patient deaf or have difficulty hearing?: No Does the patient have difficulty seeing, even when wearing glasses/contacts?: No Does the patient have difficulty concentrating, remembering, or making decisions?: No Does the patient have difficulty walking or climbing stairs?: No Does the patient have difficulty dressing or bathing?: No Does the patient have difficulty doing errands alone such as visiting a doctor's office or shopping?: No    Assessment & Plan  1. Chronic systolic heart failure (HCC)  Continue medications, up to date with cardiologist   2. Angina pectoris  associated with type 2 diabetes mellitus (HCC)  - ezetimibe (ZETIA) 10 MG tablet; Take 1 tablet (10 mg total) by mouth daily.  Dispense: 90 tablet; Refill: 1  3. Depression, major, in remission (HCC)  stable  4. Hypertension associated with diabetes (HCC)  - amLODipine (NORVASC) 5 MG tablet; Take 1 tablet (5 mg total) by mouth daily.  Dispense: 90 tablet; Refill: 1  5. Asthma, mild intermittent, well-controlled  - albuterol (VENTOLIN HFA) 108 (90 Base) MCG/ACT inhaler; Inhale 2 puffs into  the lungs every 4 (four) hours as needed for wheezing or shortness of breath.  Dispense: 18 each; Refill: 0  6. Perennial allergic rhinitis with seasonal variation   7. Coronary artery disease due to lipid rich plaque  - atorvastatin (LIPITOR) 80 MG tablet; Take 1 tablet (80 mg total) by mouth daily.  Dispense: 90 tablet; Refill: 1  8. Migraine without aura and without status migrainosus, not intractable  - Galcanezumab-gnlm (EMGALITY) 120 MG/ML SOAJ; Inject 120-240 mg into the skin every 30 (thirty) days.  Dispense: 1 mL; Refill: 5  9. Dyslipidemia due to type 2 diabetes mellitus (HCC)  - atorvastatin (LIPITOR) 80 MG tablet; Take 1 tablet (80 mg total) by mouth daily.  Dispense: 90 tablet; Refill: 1  10. Intermittent low back pain  - Ambulatory referral to Physical Therapy  11. Perennial allergic rhinitis  - levocetirizine (XYZAL) 5 MG tablet; Take 1 tablet (5 mg total) by mouth daily at 2 PM.  Dispense: 90 tablet; Refill: 1

## 2023-08-09 NOTE — Telephone Encounter (Signed)
Requested Prescriptions   Signed Prescriptions Disp Refills   ENTRESTO 24-26 MG 60 tablet 2    Sig: Take 1 tablet by mouth 2 (two) times daily.    Authorizing Provider: [name]    Ordering User: Guerry Minors

## 2023-08-09 NOTE — Patient Instructions (Signed)
[facility] at [facility]: [phone] F: [phone]

## 2023-08-10 ENCOUNTER — Telehealth: Payer: Self-pay | Admitting: Nurse Practitioner

## 2023-08-10 MED ORDER — CARVEDILOL 12.5 MG PO TABS: 12.5000 mg | ORAL_TABLET | Freq: Two times a day (BID) | ORAL | 3 refills | Status: DC

## 2023-08-10 NOTE — Telephone Encounter (Signed)
Requested Prescriptions   Signed Prescriptions Disp Refills   carvedilol (COREG) 12.5 MG tablet 60 tablet 3    Sig: Take 1 tablet (12.5 mg total) by mouth 2 (two) times daily.    Authorizing Provider: Creig Hines    Ordering User: Kendrick Fries

## 2023-08-10 NOTE — Telephone Encounter (Signed)
*  STAT* If patient is at the pharmacy, call can be transferred to refill team.   1. Which medications need to be refilled? (please list name of each medication and dose if known) carvedilol (COREG) 12.5 MG tablet    2. Would you like to learn more about the convenience, safety, & potential cost savings by using the [facility]? No   3. Are you open to using the [facility] (Type [facility].) No   4. Which pharmacy/location (including street and city if local pharmacy) is medication to be sent to?[id number] - [place], [place] - [address]    5. Do they need a 30 day or 90 day supply? 60 day

## 2023-08-12 NOTE — Telephone Encounter (Signed)
Copied from CRM [id number]. Topic: Referral - Request for Referral >> Aug 12, 2023  2:59 PM Clide Dales wrote: Patient states that the PT office she was referred to cannot see her until October-November but she called [facility] and they can see her tomorrow. Patient would like a new referral sent to them.Fax [phone]

## 2023-08-13 ENCOUNTER — Other Ambulatory Visit: Payer: Self-pay | Admitting: Family Medicine

## 2023-08-13 DIAGNOSIS — M545 Low back pain, unspecified: Secondary | ICD-10-CM

## 2023-08-18 ENCOUNTER — Telehealth: Payer: Self-pay

## 2023-08-18 NOTE — Progress Notes (Signed)
Care Coordination Note  08/18/2023 Name: Sarah Duffy MRN: [id number] DOB: 06-30-1971  Sarah Duffy is a 52 y.o. year old female who is a primary care patient of [name], MD and is actively engaged with the Chronic Care Management team. I reached out to [name] by phone today to assist with re-scheduling a follow up visit with the Pharmacist  Follow up plan: Unsuccessful telephone outreach attempt made. A HIPAA compliant phone message was left for the patient providing contact information and requesting a return call.  If patient returns call to provider office, please advise to call CCM Care Guide Penne Lash  at [phone]  Penne Lash, RMA Care Guide [facility]  [place], [place] [address] Direct Dial: [phone] [email or web address]

## 2023-08-23 ENCOUNTER — Telehealth: Payer: Self-pay

## 2023-08-23 ENCOUNTER — Other Ambulatory Visit: Payer: Self-pay

## 2023-08-23 DIAGNOSIS — M545 Low back pain, unspecified: Secondary | ICD-10-CM

## 2023-08-23 NOTE — Telephone Encounter (Signed)
Okay to place new referral for back pain?

## 2023-08-23 NOTE — Telephone Encounter (Signed)
Referral for [facility] placed

## 2023-08-25 ENCOUNTER — Ambulatory Visit: Payer: Medicaid Other | Admitting: Surgery

## 2023-08-25 ENCOUNTER — Encounter: Payer: Self-pay | Admitting: Surgery

## 2023-08-25 VITALS — BP 101/62 | HR 90 | Resp 18 | Ht 69.0 in | Wt 177.0 lb

## 2023-08-25 DIAGNOSIS — I2510 Atherosclerotic heart disease of native coronary artery without angina pectoris: Secondary | ICD-10-CM

## 2023-08-25 NOTE — Progress Notes (Signed)
HPI:  The patient is a 52 year old woman with a history of hypertension, hyperlipidemia, and diabetes who presented in March 2022 with left-sided chest discomfort and pain in her left shoulder and upper back.  She was evaluated by cardiology and had an echocardiogram showing ejection fraction of 35 to 40% with global hypokinesis and moderate mitral regurgitation.  Cardiac catheterization on 04/14/2021 showed severe multivessel coronary disease. The LAD had 70% proximal to mid stenosis, 60% mid stenosis, and 80% distal stenosis.  The left circumflex had a moderate sized second marginal branch that had 99% stenosis.  The right coronary artery was diffusely diseased with 60% distal stenosis.  The PDA had 75% proximal stenosis.  Right heart catheterization showed PA pressure of 26/16 with a wedge pressure of 15.  Right atrial pressure was 8.  Cardiac index was 2.8 and PA sat was 75%.  I saw her initially on 04/23/2021 and we made plans for coronary artery bypass graft surgery which she canceled twice. I saw her again on 08/25/2021 and she said that she was ready to undergo surgery. She was having mild occasional chest discomfort with exertion and some exertional fatigue and shortness of breath but continued to work at her job at [employer]. She was scheduled for CABG on 09/19/2021 but this had to be canceled by me earlier that morning due to an eye infection which affected my vision. She was asked to reschedule surgery but we could not contact her and she did not call us back.  I saw her again in the office on 08/12/2022 and discussed surgical treatment again with her.  She said that she was delaying surgery so that she could save up enough money to be out of work for a while after the surgery.  She also had a very limited social support system.  Her repeat echocardiogram in July 2023 showed an improvement in her ejection fraction to 50 to 55%.  There was grade 2 diastolic dysfunction.  There is mild mitral valve  regurgitation.  Right ventricular size and function was normal.  When I saw her last August she wanted to delay her surgery further because her insurance coverage was changing and she went to be sure that her new insurance will cover her to have surgery at [facility].  She is now on [place].  She continues to work and feels well overall.  She denies any chest pain or pressure at this time.  She has had no shortness of breath and no exertional fatigue.  She recently saw her PCP and was noted to have an elevated glucose and her hemoglobin A1c was still elevated at 8.4.  It was 7.9 at this time last year and was 11.4 in 2022, 13.0 in 2020.  She is scheduled to see her endocrinologist in the near future.  She also said that she is having pain in one of her teeth and that needs to be taken care of.  Current Outpatient Medications  Medication Sig Dispense Refill   albuterol (VENTOLIN HFA) 108 (90 Base) MCG/ACT inhaler Inhale 2 puffs into the lungs every 4 (four) hours as needed for wheezing or shortness of breath. 18 each 0   amLODipine (NORVASC) 5 MG tablet Take 1 tablet (5 mg total) by mouth daily. 90 tablet 1   atorvastatin (LIPITOR) 80 MG tablet Take 1 tablet (80 mg total) by mouth daily. 90 tablet 1   Blood Glucose Monitoring Suppl (ACCU-CHEK GUIDE) w/Device KIT Use to check  blood sugar 2 time daily as needed 1 kit 0   carvedilol (COREG) 12.5 MG tablet Take 1 tablet (12.5 mg total) by mouth 2 (two) times daily. 60 tablet 3   Continuous Blood Gluc Sensor (DEXCOM G7 SENSOR) MISC Use as directed. Apply 1 sensor every 10 days     ENTRESTO 24-26 MG Take 1 tablet by mouth 2 (two) times daily. 60 tablet 2   ezetimibe (ZETIA) 10 MG tablet Take 1 tablet (10 mg total) by mouth daily. 90 tablet 1   glucose blood (ACCU-CHEK GUIDE) test strip Check blood sugar 2 times daily as needed 100 each 12   insulin aspart (NOVOLOG FLEXPEN) 100 UNIT/ML FlexPen Max daily 30 units 30 mL 3   Insulin Glargine  (BASAGLAR KWIKPEN) 100 UNIT/ML Inject 70 Units into the skin daily. 60 mL 3   Insulin Pen Needle 32G X 4 MM MISC 1 Device by Does not apply route in the morning, at noon, in the evening, and at bedtime. 400 each 3   JARDIANCE 25 MG TABS tablet Take 1 tablet (25 mg total) by mouth daily. 90 tablet 2   Lancets (ACCU-CHEK MULTICLIX) lancets Check sugar 2 times daily as needed. 100 each 12   levocetirizine (XYZAL) 5 MG tablet Take 1 tablet (5 mg total) by mouth daily at 2 PM. 90 tablet 1   nitroGLYCERIN (NITROSTAT) 0.4 MG SL tablet PLACE 1 TABLET UNDER THE TONGUE EVERY 5 MINUTES AS NEEDED FOR CHEST PAIN. 25 tablet 2   spironolactone (ALDACTONE) 25 MG tablet Take 1 tablet (25 mg total) by mouth daily. 90 tablet 3   triamcinolone cream (KENALOG) 0.1 % Apply 1 Application topically 2 (two) times daily. 453.6 g 0   Rimegepant Sulfate (NURTEC) 75 MG TBDP Take 1 tablet (75 mg total) by mouth daily as needed. Max of every other day prn (Patient not taking: Reported on 08/25/2023) 16 tablet 0   No current facility-administered medications for this visit.     Physical Exam: BP 101/62 (BP Location: Left Arm, Patient Position: Sitting)   Pulse 90   Resp 18   Ht 5\' 9"  (1.753 m)   Wt 177 lb (80.3 kg)   LMP  (LMP Unknown)   SpO2 98% Comment: RA  BMI 26.14 kg/m  She looks well. Cardiac exam shows a regular rate and rhythm with normal heart sounds.  There is no murmur. Lungs are clear. There is no peripheral edema.  Diagnostic Tests:  None today  Impression:  This 52 year old woman has severe three-vessel coronary disease by catheterization in 2022 with a left ventricular ejection fraction of 50 to 55% on her most recent echocardiogram 1 year ago.  She currently denies any symptoms but was still having some chest discomfort 1 year ago when I saw her.  I think coronary artery bypass graft surgery is in her best interest to prevent further myocardial infarction and myocardial loss.  I do not think she  needs another catheterization at this time since it would not change what needs to be done at the time of surgery.  I reviewed the catheterization films again with her and explained the surgical procedure, alternatives, and risks.  She would like to schedule surgery but wants to get her tooth taken care of and see her endocrinologist to make sure that her diabetes control was maximized.  Plan:  She will call us back to schedule coronary bypass graft surgery after she has her tooth taken care of and sees her endocrinologist.  I  spent 15 minutes performing this established patient evaluation and > 50% of this time was spent face to face counseling and coordinating the care of this patient's severe multivessel coronary disease.   Alleen Borne, MD [facility] [phone]

## 2023-08-26 DIAGNOSIS — M25551 Pain in right hip: Secondary | ICD-10-CM | POA: Diagnosis not present

## 2023-08-26 DIAGNOSIS — M6281 Muscle weakness (generalized): Secondary | ICD-10-CM | POA: Diagnosis not present

## 2023-08-26 DIAGNOSIS — M545 Low back pain, unspecified: Secondary | ICD-10-CM | POA: Diagnosis not present

## 2023-08-30 DIAGNOSIS — M6281 Muscle weakness (generalized): Secondary | ICD-10-CM | POA: Diagnosis not present

## 2023-08-30 DIAGNOSIS — M545 Low back pain, unspecified: Secondary | ICD-10-CM | POA: Diagnosis not present

## 2023-08-30 DIAGNOSIS — M25551 Pain in right hip: Secondary | ICD-10-CM | POA: Diagnosis not present

## 2023-09-01 DIAGNOSIS — L819 Disorder of pigmentation, unspecified: Secondary | ICD-10-CM | POA: Diagnosis not present

## 2023-09-01 DIAGNOSIS — R21 Rash and other nonspecific skin eruption: Secondary | ICD-10-CM | POA: Diagnosis not present

## 2023-09-01 DIAGNOSIS — L209 Atopic dermatitis, unspecified: Secondary | ICD-10-CM | POA: Diagnosis not present

## 2023-09-03 ENCOUNTER — Other Ambulatory Visit: Payer: Self-pay | Admitting: Family Medicine

## 2023-09-03 DIAGNOSIS — J4520 Mild intermittent asthma, uncomplicated: Secondary | ICD-10-CM

## 2023-09-06 NOTE — Progress Notes (Signed)
Care Coordination Note  09/06/2023 Name: Alene Solesbee MRN: [id number] DOB: 13-Oct-1971  Margarett Trabue is a 52 y.o. year old female who is a primary care patient of [name], MD and is actively engaged with the Chronic Care Management team. I reached out to [name] by phone today to assist with re-scheduling a follow up visit with the Pharmacist  Follow up plan: Unsuccessful telephone outreach attempt made. A HIPAA compliant phone message was left for the patient providing contact information and requesting a return call.  If patient returns call to provider office, please advise to call CCM Care Guide Penne Lash  at [phone]  Penne Lash, RMA Care Guide [facility]  [place], [place] [address] Direct Dial: [phone] [email or web address]

## 2023-09-07 ENCOUNTER — Ambulatory Visit: Payer: Medicaid Other | Admitting: Internal Medicine

## 2023-09-07 DIAGNOSIS — M545 Low back pain, unspecified: Secondary | ICD-10-CM | POA: Diagnosis not present

## 2023-09-07 DIAGNOSIS — M6281 Muscle weakness (generalized): Secondary | ICD-10-CM | POA: Diagnosis not present

## 2023-09-07 DIAGNOSIS — M25551 Pain in right hip: Secondary | ICD-10-CM | POA: Diagnosis not present

## 2023-09-09 DIAGNOSIS — M545 Low back pain, unspecified: Secondary | ICD-10-CM | POA: Diagnosis not present

## 2023-09-09 DIAGNOSIS — M25551 Pain in right hip: Secondary | ICD-10-CM | POA: Diagnosis not present

## 2023-09-09 DIAGNOSIS — M6281 Muscle weakness (generalized): Secondary | ICD-10-CM | POA: Diagnosis not present

## 2023-09-13 NOTE — Progress Notes (Signed)
Care Coordination Note  09/13/2023 Name: [name] MRN: [id number] DOB: [birth year]  Sarah Duffy is a 52 y.o. year old female who is a primary care patient of [name], MD and is actively engaged with the Chronic Care Management team. I reached out to [name] by phone today to assist with re-scheduling a follow up visit with the Pharmacist  Follow up plan: Unable to make contact on outreach attempts x 3. PCP [name], MD notified via routed documentation in medical record.   Penne Lash, RMA Care Guide [facility]  [place], [place] [address] Direct Dial: [phone] [email or web address]

## 2023-09-14 DIAGNOSIS — M6281 Muscle weakness (generalized): Secondary | ICD-10-CM | POA: Diagnosis not present

## 2023-09-14 DIAGNOSIS — M545 Low back pain, unspecified: Secondary | ICD-10-CM | POA: Diagnosis not present

## 2023-09-14 DIAGNOSIS — M25551 Pain in right hip: Secondary | ICD-10-CM | POA: Diagnosis not present

## 2023-09-16 ENCOUNTER — Ambulatory Visit: Payer: Medicaid Other | Attending: Family Medicine

## 2023-09-16 ENCOUNTER — Other Ambulatory Visit: Payer: Self-pay

## 2023-09-16 DIAGNOSIS — M25551 Pain in right hip: Secondary | ICD-10-CM | POA: Diagnosis not present

## 2023-09-16 DIAGNOSIS — M6281 Muscle weakness (generalized): Secondary | ICD-10-CM | POA: Diagnosis not present

## 2023-09-16 DIAGNOSIS — M545 Low back pain, unspecified: Secondary | ICD-10-CM | POA: Insufficient documentation

## 2023-09-16 DIAGNOSIS — M5459 Other low back pain: Secondary | ICD-10-CM | POA: Insufficient documentation

## 2023-09-16 NOTE — Therapy (Addendum)
OUTPATIENT PHYSICAL THERAPY THORACOLUMBAR EVALUATION   Patient Name: Sarah Duffy MRN: [id number] DOB:01/03/1971, 52 y.o., female Today's Date: 09/16/2023  END OF SESSION:  PT End of Session - 09/16/23 1054     Visit Number 1    Number of Visits 9    Date for PT Re-Evaluation 11/11/23    PT Start Time 0946    PT Stop Time 1038    PT Time Calculation (min) 52 min    Activity Tolerance Patient tolerated treatment well    Behavior During Therapy 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 02/2021 Echo: EF 35-40%, glob HK, GrI DD, nl RV fxn, mildly dil LA, mod MR; b. 06/2022 Echo: EF 50-55%, no rwma, GrII DD, nl RV fxn, RVSP , mildly dil LA, mild MR, AoV sclerosis.   CKD (chronic kidney disease), stage III (HCC)    Complication of anesthesia 03/23/2023   Possible gastroparesis based off food in stomach during EGD   Coronary artery disease    a. 04/2021 Cath: LM nl, LAD 70p/m, 44m, 80d, LCX 50m, OM1 40, OM2 99 (fills via collats from OM1), RCA 60d, RPDA 75.   COVID-19 2021   Hyperlipidemia LDL goal <70    Hypertension    Ischemic cardiomyopathy    a. 02/2021 Echo: EF 35-40%, glob HK, GrI DD; b. 06/2022 Echo: EF 50-55%.   Moderate mitral regurgitation    a. 02/2021 Echo: Mod MR.   MRSA infection within last 3 months 02/25/2016   Osteomyelitis of foot (HCC) 08/26/2016   Type II diabetes mellitus (HCC)    Past Surgical History:  Procedure Laterality Date   CHOLECYSTECTOMY  1999   COLONOSCOPY WITH PROPOFOL N/A 03/25/2021   Procedure: COLONOSCOPY WITH PROPOFOL;  Surgeon: [name], MD;  Location: [facility] ENDOSCOPY;  Service: Gastroenterology;  Laterality: N/A;   COLONOSCOPY WITH PROPOFOL N/A 04/17/2021   Procedure: COLONOSCOPY WITH PROPOFOL;  Surgeon: [name], MD;  Location: [facility] ENDOSCOPY;  Service: Gastroenterology;  Laterality: N/A;    ESOPHAGOGASTRODUODENOSCOPY (EGD) WITH PROPOFOL N/A 03/25/2021   Procedure: ESOPHAGOGASTRODUODENOSCOPY (EGD) WITH PROPOFOL;  Surgeon: [name], MD;  Location: [facility] ENDOSCOPY;  Service: Gastroenterology;  Laterality: N/A;   ESOPHAGOGASTRODUODENOSCOPY (EGD) WITH PROPOFOL N/A 04/17/2021   Procedure: ESOPHAGOGASTRODUODENOSCOPY (EGD) WITH PROPOFOL;  Surgeon: [name], MD;  Location: [facility] ENDOSCOPY;  Service: Gastroenterology;  Laterality: N/A;   ESOPHAGOGASTRODUODENOSCOPY (EGD) WITH PROPOFOL N/A 03/23/2023   Procedure: ESOPHAGOGASTRODUODENOSCOPY (EGD) WITH PROPOFOL;  Surgeon: [name], MD;  Location: [facility] ENDOSCOPY;  Service: Gastroenterology;  Laterality: N/A;   RIGHT/LEFT HEART CATH AND CORONARY ANGIOGRAPHY Bilateral 04/14/2021   Procedure: RIGHT/LEFT HEART CATH AND CORONARY ANGIOGRAPHY;  Surgeon: Yvonne Kendall, MD;  Location: [facility] INVASIVE CV LAB;  Service: Cardiovascular;  Laterality: Bilateral;   TOE SURGERY Left 02/07/2016   Pinky Toe   Patient Active Problem List   Diagnosis Date Noted   Arthropathy of lumbar facet joint 07/29/2023   Low back pain 07/29/2023   Pain in finger of left hand 07/29/2023   Sacroiliac joint pain 07/29/2023   Contact dermatitis and eczema 06/08/2023   Dysphagia 03/23/2023   Asthma, well controlled 03/09/2023   Depression, major, in remission (HCC) 03/09/2023   Perennial allergic rhinitis with seasonal variation 03/09/2023   Type 2 diabetes mellitus with diabetic polyneuropathy, with long-term current use of insulin (HCC) 08/31/2022   Type 2  diabetes mellitus with retinopathy, with long-term current use of insulin (HCC) 08/31/2022   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(HCC) 08/31/2022   Hypertension 06/11/2022   Ischemic cardiomyopathy 06/11/2022   Coronary artery disease 06/11/2022   Chronic systolic heart failure (HCC) 06/11/2022   Angina pectoris associated with type 2 diabetes mellitus (HCC) 01/05/2022   Proliferative diabetic retinopathy of both  eyes with macular edema associated with type 2 diabetes mellitus (HCC) 05/01/2021   Cardiomyopathy (HCC) 04/14/2021   Depression, major, recurrent, mild (HCC) 09/25/2019   History of 2019 novel coronavirus disease (COVID-19) 06/23/2019   Migraine headache without aura 01/14/2018   Type II diabetes mellitus (HCC) 05/22/2017   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 associated with type 2 diabetes mellitus (HCC) 05/21/2016   Severe nonproliferative diabetic retinopathy of right eye without macular edema associated with type 2 diabetes mellitus (HCC) 05/21/2016   Hyperlipidemia LDL goal <70 03/10/2016   MI (mitral incompetence) 02/25/2016   Insomnia 02/25/2016   Anxiety 09/04/2014    PCP: [name], MD  REFERRING PROVIDER: [name], MD  REFERRING DIAG:  M54.50 (ICD-10-CM) - Intermittent low back pain    Rationale for Evaluation and Treatment: Rehabilitation  THERAPY DIAG:  Other low back pain  Muscle weakness (generalized)  ONSET DATE: June, 2024   SUBJECTIVE:                                                                                                                                                                                           SUBJECTIVE STATEMENT:  Pt is a pleasant 52 y/o F presenting to OPPT w/ LBP and balance deficits.   PERTINENT HISTORY:  Pt presents to OPPT with LBP and balance deficits that started in June, 2024. During this time she was having very severe migraines w/ light sensitivity and was receiving medical treatment. Over the past several months,following a viral infection, pt has reported speech difficulties with certain words, weight loss of 10-15# and balance deficits in addition to her back pain. Also with ED/urgent care visit for nausea and recent rash on LE's and difficulty with mobilizing her LLE due to significant weakness. Pt has seen her PCP and an orthopedic specialist regarding her LBP. Pt  reports increased difficulty with ascending stairs, and getting in and out of a car or bathtub. Pt reports her pain at best is a 3/10NPS and at worse is an 8/10. Pt's LBP is worse in the morning, and gets better throughout the day. Pt's pain is relieved with heat and Tylenol.   PAIN:  Are you having pain? Yes: NPRS scale: 3/10 Pain location: Bilat para-spinals  Pain description: Dull and achey, sharp at night into R buttocks  Aggravating factors: Going up stairs, getting in and out of a car, getting in and out of the tub  Relieving factors: Heat, Tylenol  Worst pain 8/10 LBP is worse in the morning, less throughout the day  PRECAUTIONS: Fall  RED FLAGS: None   WEIGHT BEARING RESTRICTIONS: No  FALLS:  Has patient fallen in last 6 months? No  LIVING ENVIRONMENT:  Stairs: Yes: External: 3 steps; on left going up   OCCUPATION: [employer]   PLOF: Independent  PATIENT GOALS: Be able to move better  NEXT MD VISIT: 09/20/23  OBJECTIVE:  Note: Objective measures were completed at Evaluation unless otherwise noted.  DIAGNOSTIC FINDINGS:  Pt reports X-rays taken at [facility], she states that there were no notable findings from x-rays.   PATIENT SURVEYS:  FOTO 43/60  SCREENING FOR RED FLAGS: Bowel or bladder incontinence: No   COGNITION: Overall cognitive status: Within functional limits for tasks assessed     SENSATION: WFL   PALPATION: No TTP noted.   LUMBAR ROM:   AROM eval  Flexion WFL  Extension WFL  Right lateral flexion WFL*  Left lateral flexion WFL*  Right rotation WFL  Left rotation WFL    (Blank rows = not tested)  LOWER EXTREMITY ROM: All LE ROM WFL   LOWER EXTREMITY MMT:    MMT Right eval Left eval  Hip flexion 3+ 2  Hip extension 2+ 2+  Hip abduction 3 3  Hip adduction 3 3  Hip internal rotation 3+ 3+  Hip external rotation 3 3  Knee flexion 4 4  Knee extension 4 4  Ankle dorsiflexion 4 4-  Ankle plantarflexion 4 4    (Blank rows = not tested)  LUMBAR SPECIAL TESTS:   SI Compression/distraction test: Negative, FABER test: Negative, and FADDIR: (R) negative (L) positive Scour bilat: Negative, Thigh Thrust bilat: Negative  REFLEXES:   R/ L patellar tendon: areflexive; R/L achilles tendon: areflexive  FUNCTIONAL TESTS:  5 times sit to stand: Deferred to next session Timed up and go (TUG): Deferred to next session Functional gait assessment: Deferred to next session  GAIT: Comments: Increased ER of LLE with ambulation, decreased ability to ambulate in a straight line  TODAY'S TREATMENT:                                                                                                                              DATE:     PATIENT EDUCATION:  Education details: Educated pt on findings from evaluation (strength deficits, subjective)  Person educated: Patient Education method: Explanation Education comprehension: verbalized understanding  HOME EXERCISE PROGRAM: Deferred to next session  ASSESSMENT:  CLINICAL IMPRESSION: Patient is a 52 y.o. F who was seen today for physical therapy evaluation and treatment for LBP and balance deficits. Pt notes full AROM of lumbar spine and only mild concordant pain with R/L lateral flexion of  the lumbar spine. Pt notes significant weakness in bilat LE's noted w/ MMT, with the most significant being the inability to flex her L hip. This muscular weakness requires the pt to manually lift her left leg when transferring from sit to supine on the plinth, and getting in and out of vehicles. Pt notes areflexia of bilat LE's at the patellar and achilles tendon. All SIJ cluster testing was negative in addition to FABER bilat, and FADDIR on the LLE only mildly noted concordant pain. Pt presents with bilat LE weakness, balance deficits noted with ambulation, along w/ subjective findings (reference pertinent history) that indicates the potential for additional neurological testing  needed from pt's MD. Pt's PCP was messaged and updated regarding findings from pt's evaluation. Pt would benefit from 1x/ week for 8 weeks of skilled PT interventions to address bilat LE strength and balance deficits.  OBJECTIVE IMPAIRMENTS: decreased mobility, difficulty walking, decreased strength, and pain.   ACTIVITY LIMITATIONS: carrying, lifting, squatting, stairs, and transfers  PARTICIPATION LIMITATIONS: driving, community activity, and occupation  PERSONAL FACTORS: Past/current experiences and Time since onset of injury/illness/exacerbation are also affecting patient's functional outcome.   REHAB POTENTIAL: Good  CLINICAL DECISION MAKING: Evolving/moderate complexity  EVALUATION COMPLEXITY: Moderate   GOALS: Goals reviewed with patient? Yes  SHORT TERM GOALS: Target date: 10/14/23  Pt will be independent with HEP to improve bilat LE strength and reduce low back pain with functional activities  Baseline: 09/16/23: Deferred to next session Goal status: INITIAL   LONG TERM GOALS: Target date: 11/11/23  Pt will improve FOTO to target score to demonstrate clinically significant improvement in functional mobility.   Baseline: 09/16/23: 43/60 Goal status: INITIAL  2.  Pt to improve 5x STS to < 11.4 seconds to display improved functional mobility and LE strength to meet age matched norms.  Baseline: 09/16/23: Deferred to next session Goal status: INITIAL  3.  Pt to improve TUG time by at least 3.4 seconds for clinically significant improvement for decreased risk of falls.  Baseline: 09/16/23: Deferred to next session  Goal status: INITIAL  4.   Pt will improve FGA score by ___ points to display clinically significant improvement in balance.  Baseline: 09/16/23: Deferred to next session Goal status: INITIAL  5.  Pt will improve all bilat LE AROM to </= 4/5 MMT to display functional improvements in bilat LE strength.  Baseline: 09/16/23-  MMT Right eval Left eval  Hip  flexion 3+ 2  Hip extension 2+ 2+  Hip abduction 3 3  Hip adduction 3 3  Hip internal rotation 3+ 3+  Hip external rotation 3 3  Knee flexion 4 4  Knee extension 4 4  Ankle dorsiflexion 4 4-  Ankle plantarflexion 4 4   Goal status: INITIAL   PLAN:  PT FREQUENCY: 1x/week  PT DURATION: 8 weeks  PLANNED INTERVENTIONS: Therapeutic exercises, Therapeutic activity, Neuromuscular re-education, Balance training, Gait training, Patient/Family education, Self Care, Joint mobilization, Stair training, Electrical stimulation, Spinal manipulation, Spinal mobilization, Cryotherapy, Moist heat, and Re-evaluation.  PLAN FOR NEXT SESSION: 5xSTS, FGA, TUG, HEP    Lovie Macadamia, SPT   Mentor. Fairly IV, PT, DPT Physical Therapist- [place]  [facility]  09/16/2023, 1:07 PM

## 2023-09-18 ENCOUNTER — Other Ambulatory Visit: Payer: Self-pay

## 2023-09-18 DIAGNOSIS — G43809 Other migraine, not intractable, without status migrainosus: Secondary | ICD-10-CM | POA: Diagnosis not present

## 2023-09-18 DIAGNOSIS — I129 Hypertensive chronic kidney disease with stage 1 through stage 4 chronic kidney disease, or unspecified chronic kidney disease: Secondary | ICD-10-CM | POA: Insufficient documentation

## 2023-09-18 DIAGNOSIS — Z8616 Personal history of COVID-19: Secondary | ICD-10-CM | POA: Diagnosis not present

## 2023-09-18 DIAGNOSIS — K5900 Constipation, unspecified: Secondary | ICD-10-CM | POA: Insufficient documentation

## 2023-09-18 DIAGNOSIS — I2510 Atherosclerotic heart disease of native coronary artery without angina pectoris: Secondary | ICD-10-CM | POA: Insufficient documentation

## 2023-09-18 DIAGNOSIS — N183 Chronic kidney disease, stage 3 unspecified: Secondary | ICD-10-CM | POA: Diagnosis not present

## 2023-09-18 DIAGNOSIS — M5442 Lumbago with sciatica, left side: Secondary | ICD-10-CM | POA: Insufficient documentation

## 2023-09-18 DIAGNOSIS — R519 Headache, unspecified: Secondary | ICD-10-CM | POA: Diagnosis not present

## 2023-09-18 DIAGNOSIS — E119 Type 2 diabetes mellitus without complications: Secondary | ICD-10-CM | POA: Insufficient documentation

## 2023-09-18 DIAGNOSIS — M545 Low back pain, unspecified: Secondary | ICD-10-CM | POA: Diagnosis not present

## 2023-09-18 DIAGNOSIS — M47816 Spondylosis without myelopathy or radiculopathy, lumbar region: Secondary | ICD-10-CM | POA: Diagnosis not present

## 2023-09-18 NOTE — ED Triage Notes (Signed)
Pt c/o left sided migraine and low back that radiates into the tailbone x 1 month. Pt is AOX4, NAD noted. PERRLA.

## 2023-09-19 ENCOUNTER — Emergency Department: Payer: Medicaid Other

## 2023-09-19 ENCOUNTER — Emergency Department
Admission: EM | Admit: 2023-09-19 | Discharge: 2023-09-19 | Disposition: A | Discharge status: Home / Self Care | Payer: Medicaid Other | Attending: Emergency Medicine | Admitting: Emergency Medicine

## 2023-09-19 DIAGNOSIS — G43809 Other migraine, not intractable, without status migrainosus: Secondary | ICD-10-CM

## 2023-09-19 DIAGNOSIS — K5900 Constipation, unspecified: Secondary | ICD-10-CM

## 2023-09-19 DIAGNOSIS — R519 Headache, unspecified: Secondary | ICD-10-CM | POA: Diagnosis not present

## 2023-09-19 DIAGNOSIS — M47816 Spondylosis without myelopathy or radiculopathy, lumbar region: Secondary | ICD-10-CM | POA: Diagnosis not present

## 2023-09-19 DIAGNOSIS — M5442 Lumbago with sciatica, left side: Secondary | ICD-10-CM

## 2023-09-19 DIAGNOSIS — M545 Low back pain, unspecified: Secondary | ICD-10-CM | POA: Diagnosis not present

## 2023-09-19 MED ORDER — LACTULOSE 10 GM/15ML PO SOLN: 20.0000 g | Freq: Every day | ORAL | 0 refills | Status: DC | PRN

## 2023-09-19 MED ORDER — NAPROXEN 500 MG PO TABS: 500.0000 mg | ORAL_TABLET | Freq: Two times a day (BID) | ORAL | 0 refills | Status: DC

## 2023-09-19 MED ORDER — KETOROLAC TROMETHAMINE 60 MG/2ML IM SOLN
30.0000 mg | Freq: Once | INTRAMUSCULAR | Status: AC
  Administered 2023-09-19: 30 mg via INTRAMUSCULAR
  Filled 2023-09-19: qty 2

## 2023-09-19 MED ORDER — CYCLOBENZAPRINE HCL 5 MG PO TABS: ORAL_TABLET | ORAL | 0 refills | Status: DC

## 2023-09-19 MED ORDER — PREDNISONE 20 MG PO TABS
30.0000 mg | ORAL_TABLET | Freq: Once | ORAL | Status: AC
  Administered 2023-09-19: 30 mg via ORAL
  Filled 2023-09-19: qty 1

## 2023-09-19 MED ORDER — BUTALBITAL-APAP-CAFFEINE 50-325-40 MG PO TABS: 1.0000 | ORAL_TABLET | ORAL | 0 refills | Status: DC | PRN

## 2023-09-19 MED ORDER — METHYLPREDNISOLONE 4 MG PO TBPK: ORAL_TABLET | ORAL | 0 refills | Status: DC

## 2023-09-19 NOTE — ED Provider Notes (Signed)
[facility] Provider Note    Event Date/Time   First MD Initiated Contact with Patient 09/19/23 0151     (approximate)   History   Migraine and Back Pain   HPI  Sarah Duffy is a 52 y.o. female who presents to the ED from home with a chief complaint of migraine headache and low back pain.  Patient reports left-sided migraine headache since June of this year.  Her PCP placed her on Emgality during the summer.  She had injections each month and did not require 1 in September as her headaches had lessened.  Headache is left-sided, does not affect her vision, and sometimes associated with nausea.  For the past month, patient has noted low back pain with occasional radiation down her left leg.  Denies associated extremity weakness/numbness/tingling.  Denies bowel or bladder incontinence.  Denies fever/chills, cough, chest pain, shortness of breath, abdominal pain, nausea, vomiting, dysuria or dizziness.  Denies tick bite over the summer.  Works 2 jobs Well[employer] ge[employer], is on her feet a lot.  Denies fall/trauma/injury.     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 02/2021 Echo: EF 35-40%, glob HK, GrI DD, nl RV fxn, mildly dil LA, mod MR; b. 06/2022 Echo: EF 50-55%, no rwma, GrII DD, nl RV fxn, RVSP , mildly dil LA, mild MR, AoV sclerosis.   CKD (chronic kidney disease), stage III (HCC)    Complication of anesthesia 03/23/2023   Possible gastroparesis based off food in stomach during EGD   Coronary artery disease    a. 04/2021 Cath: LM nl, LAD 70p/m, 35m, 80d, LCX 45m, OM1 40, OM2 99 (fills via collats from OM1), RCA 60d, RPDA 75.   COVID-19 2021   Hyperlipidemia LDL goal <70    Hypertension    Ischemic cardiomyopathy    a. 02/2021 Echo: EF 35-40%, glob HK, GrI DD; b. 06/2022 Echo: EF 50-55%.   Moderate mitral regurgitation    a. 02/2021 Echo: Mod MR.   MRSA infection  within last 3 months 02/25/2016   Osteomyelitis of foot (HCC) 08/26/2016   Type II diabetes mellitus [facility])      Active Problem List   Patient Active Problem List   Diagnosis Date Noted   Arthropathy of lumbar facet joint 07/29/2023   Low back pain 07/29/2023   Pain in finger of left hand 07/29/2023   Sacroiliac joint pain 07/29/2023   Contact dermatitis and eczema 06/08/2023   Dysphagia 03/23/2023   Asthma, well controlled 03/09/2023   Depression, major, in remission (HCC) 03/09/2023   Perennial allergic rhinitis with seasonal variation 03/09/2023   Type 2 diabetes mellitus with diabetic polyneuropathy, with long-term current use of insulin (HCC) 08/31/2022   Type 2 diabetes mellitus with retinopathy, with long-term current use of insulin (HCC) 08/31/2022   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(HCC) 08/31/2022   Hypertension 06/11/2022   Ischemic cardiomyopathy 06/11/2022   Coronary artery disease 06/11/2022   Chronic systolic heart failure (HCC) 06/11/2022   Angina pectoris associated with type 2 diabetes mellitus (HCC) 01/05/2022   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 edema associated with type 2 diabetes mellitus (HCC) 05/01/2021   Cardiomyopathy (HCC) 04/14/2021   Depression, major, recurrent, mild (HCC) 09/25/2019   History of 2019 novel coronavirus disease (COVID-19) 06/23/2019   Migraine headache without aura 01/14/2018   Type II diabetes mellitus (HCC) 05/22/2017  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 associated with type 2 diabetes mellitus (HCC) 05/21/2016   Severe nonproliferative diabetic retinopathy of right eye without macular edema associated with type 2 diabetes mellitus (HCC) 05/21/2016   Hyperlipidemia LDL goal <70 03/10/2016   MI (mitral incompetence) 02/25/2016   Insomnia 02/25/2016   Anxiety 09/04/2014     Past Surgical History   Past Surgical History:  Procedure Laterality Date    CHOLECYSTECTOMY  1999   COLONOSCOPY WITH PROPOFOL N/A 03/25/2021   Procedure: COLONOSCOPY WITH PROPOFOL;  Surgeon: [name], MD;  Location: [facility] ENDOSCOPY;  Service: Gastroenterology;  Laterality: N/A;   COLONOSCOPY WITH PROPOFOL N/A 04/17/2021   Procedure: COLONOSCOPY WITH PROPOFOL;  Surgeon: [name], MD;  Location: [facility] ENDOSCOPY;  Service: Gastroenterology;  Laterality: N/A;   ESOPHAGOGASTRODUODENOSCOPY (EGD) WITH PROPOFOL N/A 03/25/2021   Procedure: ESOPHAGOGASTRODUODENOSCOPY (EGD) WITH PROPOFOL;  Surgeon: [name], MD;  Location: [facility] ENDOSCOPY;  Service: Gastroenterology;  Laterality: N/A;   ESOPHAGOGASTRODUODENOSCOPY (EGD) WITH PROPOFOL N/A 04/17/2021   Procedure: ESOPHAGOGASTRODUODENOSCOPY (EGD) WITH PROPOFOL;  Surgeon: [name], MD;  Location: [facility] ENDOSCOPY;  Service: Gastroenterology;  Laterality: N/A;   ESOPHAGOGASTRODUODENOSCOPY (EGD) WITH PROPOFOL N/A 03/23/2023   Procedure: ESOPHAGOGASTRODUODENOSCOPY (EGD) WITH PROPOFOL;  Surgeon: [name], MD;  Location: [facility] ENDOSCOPY;  Service: Gastroenterology;  Laterality: N/A;   RIGHT/LEFT HEART CATH AND CORONARY ANGIOGRAPHY Bilateral 04/14/2021   Procedure: RIGHT/LEFT HEART CATH AND CORONARY ANGIOGRAPHY;  Surgeon: Yvonne Kendall, MD;  Location: [facility] INVASIVE CV LAB;  Service: Cardiovascular;  Laterality: Bilateral;   TOE SURGERY Left 02/07/2016   Pinky Toe     Home Medications   Prior to Admission medications   Medication Sig Start Date End Date Taking? Authorizing Provider  albuterol (VENTOLIN HFA) 108 (90 Base) MCG/ACT inhaler INHALE 2 PUFFS INTO THE LUNGS EVERY 4 HOURS AS NEEDED FOR WHEEZING OR SHORTNESS OF BREATH. 09/03/23   Berniece Salines, FNP  amLODipine (NORVASC) 5 MG tablet Take 1 tablet (5 mg total) by mouth daily. 08/09/23   [name], MD  atorvastatin (LIPITOR) 80 MG tablet Take 1 tablet (80 mg total) by mouth daily. 08/09/23   [name], MD  Blood Glucose Monitoring Suppl (ACCU-CHEK GUIDE) w/Device KIT Use  to check blood sugar 2 time daily as needed 03/24/23   [name], [name], MD  carvedilol (COREG) 12.5 MG tablet Take 1 tablet (12.5 mg total) by mouth 2 (two) times daily. 08/10/23   Creig Hines, NP  Continuous Blood Gluc Sensor (DEXCOM G7 SENSOR) MISC Use as directed. Apply 1 sensor every 10 days    [provider]  ENTRESTO 24-26 MG Take 1 tablet by mouth 2 (two) times daily. 08/09/23   [name], MD  ezetimibe (ZETIA) 10 MG tablet Take 1 tablet (10 mg total) by mouth daily. 08/09/23   [name], MD  glucose blood (ACCU-CHEK GUIDE) test strip Check blood sugar 2 times daily as needed 03/24/23   [name], [name], MD  insulin aspart (NOVOLOG FLEXPEN) 100 UNIT/ML FlexPen Max daily 30 units 06/21/23   [name], [name], MD  Insulin Glargine (BASAGLAR KWIKPEN) 100 UNIT/ML Inject 70 Units into the skin daily. 06/21/23   [name], [name], MD  Insulin Pen Needle 32G X 4 MM MISC 1 Device by Does not apply route in the morning, at noon, in the evening, and at bedtime. 06/21/23   [name], [name], MD  JARDIANCE 25 MG TABS tablet Take 1 tablet (25 mg total) by mouth daily. 06/21/23  [name], [name], MD  Lancets (ACCU-CHEK MULTICLIX) lancets Check sugar 2 times daily as needed. 03/24/23   [name], [name], MD  levocetirizine (XYZAL) 5 MG tablet Take 1 tablet (5 mg total) by mouth daily at 2 PM. 08/09/23   Carlynn Purl, Danna Hefty, MD  nitroGLYCERIN (NITROSTAT) 0.4 MG SL tablet PLACE 1 TABLET UNDER THE TONGUE EVERY 5 MINUTES AS NEEDED FOR CHEST PAIN. 07/23/23   [name], MD  Rimegepant Sulfate (NURTEC) 75 MG TBDP Take 1 tablet (75 mg total) by mouth daily as needed. Max of every other day prn Patient not taking: Reported on 08/25/2023 06/22/23   [name], MD  spironolactone (ALDACTONE) 25 MG tablet Take 1 tablet (25 mg total) by mouth daily. 10/12/22   [name], MD  triamcinolone cream (KENALOG) 0.1 %  Apply 1 Application topically 2 (two) times daily. 03/09/23   [name], MD     Allergies  Patient has no known allergies.   Family History   Family History  Problem Relation Age of Onset   Diabetes Mother    Ulcers Mother    Heart disease Father    AAA (abdominal aortic aneurysm) Father    Diabetes Father    Hypertension Father    Stroke Father    Alzheimer's disease Father    Heart attack Sister    Seizures Brother    Diabetes Maternal Grandmother    Breast cancer Maternal Grandmother      Physical Exam  Triage Vital Signs: ED Triage Vitals  Encounter Vitals Group     BP 09/18/23 2153 129/77     Systolic BP Percentile --      Diastolic BP Percentile --      Pulse Rate 09/18/23 2153 87     Resp 09/18/23 2153 16     Temp 09/18/23 2153 99.3 F (37.4 C)     Temp Source 09/18/23 2153 Oral     SpO2 09/18/23 2153 97 %     Weight 09/18/23 2153 170 lb (77.1 kg)     Height 09/18/23 2153 5\' 9"  (1.753 m)     Head Circumference --      Peak Flow --      Pain Score 09/18/23 2200 7     Pain Loc --      Pain Education --      Exclude from Growth Chart --     Updated Vital Signs: BP 134/78 (BP Location: Left Arm)   Pulse 82   Temp 98.4 F (36.9 C) (Oral)   Resp 16   Ht 5\' 9"  (1.753 m)   Wt 77.1 kg   LMP  (LMP Unknown)   SpO2 98%   BMI 25.10 kg/m    General: Awake, no distress.  CV:  RRR.  Good peripheral perfusion.  Resp:  Normal effort.  CTAB. Abd:  Nontender.  No distention.  Other:  PERRL.  EOMI.  No carotid bruits.  Supple neck without meningismus.  Alert and oriented x 3.  CN II-XII grossly intact.  5/5 motor strength and sensation all extremities.  No midline lumbar spine tenderness to palpation.  Left paralumbar spinal muscle spasms.  Tender to palpation left buttock. + Straight leg raise.  2+ distal pulses.   ED Results / Procedures / Treatments  Labs (all labs ordered are listed, but only abnormal results are displayed) Labs Reviewed - No data  to display   EKG  None   RADIOLOGY I have independently visualized and interpreted patient's imaging studies as  well as noted the radiology interpretation:  CT head: No ICH  Lumbar spine: No acute osseous abnormality, large colonic stool load  Official radiology report(s): DG Lumbar Spine Complete  Result Date: 09/19/2023 CLINICAL DATA:  Low back pain.  Radiating to tailbone. EXAM: LUMBAR SPINE - COMPLETE 4+ VIEW COMPARISON:  None Available. FINDINGS: No evidence of acute fracture or traumatic listhesis. Intervertebral disc space height is maintained. Mild lower lumbar facet arthropathy. Large colonic stool load. IMPRESSION: 1. No evidence of acute fracture or traumatic listhesis. 2. Large colonic stool load.  Correlate for constipation. Electronically Signed   By: Minerva Fester M.D.   On: 09/19/2023 03:10   CT Head Wo Contrast  Result Date: 09/19/2023 CLINICAL DATA:  Headache EXAM: CT HEAD WITHOUT CONTRAST TECHNIQUE: Contiguous axial images were obtained from the base of the skull through the vertex without intravenous contrast. RADIATION DOSE REDUCTION: This exam was performed according to the departmental dose-optimization program which includes automated exposure control, adjustment of the mA and/or kV according to patient size and/or use of iterative reconstruction technique. COMPARISON:  06/28/2020 FINDINGS: Brain: No mass,hemorrhage or extra-axial collection. Normal appearance of the parenchyma and CSF spaces. Vascular: No hyperdense vessel or unexpected vascular calcification. Skull: The visualized skull base, calvarium and extracranial soft tissues are normal. Sinuses/Orbits: No fluid levels or advanced mucosal thickening of the visualized paranasal sinuses. No mastoid or middle ear effusion. Normal orbits. IMPRESSION: Normal head CT. Electronically Signed   By: Deatra Robinson M.D.   On: 09/19/2023 03:03     PROCEDURES:  Critical Care performed: No  Procedures   MEDICATIONS  ORDERED IN ED: Medications  ketorolac (TORADOL) injection 30 mg (30 mg Intramuscular Given 09/19/23 0227)  predniSONE (DELTASONE) tablet 30 mg (30 mg Oral Given 09/19/23 0227)     IMPRESSION / MDM / ASSESSMENT AND PLAN / ED COURSE  I reviewed the triage vital signs and the nursing notes.                             52 year old female presenting with left-sided headache and low back pain; headache times several months, low back pain x 1 month. Differential diagnosis includes, but is not limited to, intracranial hemorrhage, meningitis/encephalitis, previous head trauma, cavernous venous thrombosis, tension headache, temporal arteritis, migraine or migraine equivalent, idiopathic intracranial hypertension, and non-specific headache, musculoskeletal pain, kidney stone, UTI, sciatica, etc.  I have personally reviewed patient's records and note an office visit in 07/29/2023 for sacroiliac joint pain, arthropathy of lumbar facet joint.  Patient's presentation is most consistent with acute complicated illness / injury requiring diagnostic workup.  Patient denies prior imaging.  Will obtain CT head, lumbar spine x-rays.  Patient is driving herself so will administer IM Ketorolac, start Prednisone for sciatica.  I did caution the patient her blood sugars will temporarily increase secondary to steroids..  Will reassess.  Clinical Course as of 09/19/23 8295  Wynelle Link Sep 19, 2023  6213 Patient feeling better. Updated her on CT head and lumbar imaging results.  Will place on as needed lactulose, Medrol Dosepak, muscle relaxer, Fioricet and refer to orthopedics as well as neurology. Strict return precautions given. Patient verbalizes understanding and agrees with plan of care. [JS]    Clinical Course User Index [JS] Irean Hong, MD     FINAL CLINICAL IMPRESSION(S) / ED DIAGNOSES   Final diagnoses:  Acute left-sided low back pain with left-sided sciatica  Other migraine without status migrainosus, not  intractable  Constipation, unspecified constipation type     Rx / DC Orders   ED Discharge Orders     None        Note:  This document was prepared using Dragon voice recognition software and may include unintentional dictation errors.   Irean Hong, MD 09/19/23 [phone]

## 2023-09-19 NOTE — Discharge Instructions (Signed)
1.  You may take Lactulose as needed for bowel movements. 2.  Take and finish steroid taper as prescribed. 3.  You may take medicines as needed for pain and muscle spasms (Naprosyn/Flexeril). 4.  You may take Fioricet as needed for headache. 5.  Return to the ER for worsening symptoms, persistent vomiting, difficulty breathing, or other concerns.

## 2023-09-20 ENCOUNTER — Ambulatory Visit (INDEPENDENT_AMBULATORY_CARE_PROVIDER_SITE_OTHER): Payer: Medicaid Other | Admitting: Physician Assistant

## 2023-09-20 ENCOUNTER — Encounter: Payer: Self-pay | Admitting: Physician Assistant

## 2023-09-20 ENCOUNTER — Other Ambulatory Visit: Payer: Medicaid Other | Admitting: *Deleted

## 2023-09-20 VITALS — BP 104/62 | HR 94 | Temp 97.5°F | Resp 16 | Ht 69.0 in | Wt 167.4 lb

## 2023-09-20 DIAGNOSIS — G8929 Other chronic pain: Secondary | ICD-10-CM

## 2023-09-20 DIAGNOSIS — G43009 Migraine without aura, not intractable, without status migrainosus: Secondary | ICD-10-CM

## 2023-09-20 DIAGNOSIS — M545 Low back pain, unspecified: Secondary | ICD-10-CM | POA: Diagnosis not present

## 2023-09-20 DIAGNOSIS — R29898 Other symptoms and signs involving the musculoskeletal system: Secondary | ICD-10-CM | POA: Insufficient documentation

## 2023-09-20 NOTE — Progress Notes (Signed)
Acute Office Visit   Patient: Sarah Duffy   DOB: [birth year]   52 y.o. Female  MRN: [id number] Visit Date: 09/20/2023  Today's healthcare provider: Oswaldo Conroy Railynn Ballo, PA-C  Introduced myself to the patient as a Secondary school teacher and provided education on APPs in clinical practice.    Chief Complaint  Patient presents with   Back Pain    Into the tailbone    Extremity Weakness    On Left leg, to where pt has to put support into getting in the car by lifting her leg. Started around August   Subjective    HPI HPI     Back Pain    Additional comments: Into the tailbone         Extremity Weakness    Additional comments: On Left leg, to where pt has to put support into getting in the car by lifting her leg. Started around August      Last edited by Forde Radon, CMA on 09/20/2023  9:43 AM.        Back Pain and weakness on left side   Onset: gradual  Duration: several months  Location:low back pain into tailbone  Radiation: radiates to tailbone and into the left side  Pain level and character: over the weekend it was 7-8/10  Other associated symptoms: left leg feels weak  She denies persistent tingling in legs or numbness  Interventions: she was referred to PT, she went to ED for eval over the weekend  Alleviating: sometimes laying flat  Aggravating: standing and sitting   Patient was seen in ED yesterday for back pain- started on Medrol dose pack, cyclobenzaprine, naproxen  Reviewed CT head and lumbar spine xray results   She was evaluated for initial intake with PT on 09/16/23   She reports her constipation seems resolved- she had large bowel movement last night after taking Lactulose from ED    She reports she works 2 full time jobs and is concerned about her performance    Medications: Outpatient Medications Prior to Visit  Medication Sig   albuterol (VENTOLIN HFA) 108 (90 Base) MCG/ACT inhaler INHALE 2 PUFFS INTO THE LUNGS EVERY 4 HOURS AS NEEDED  FOR WHEEZING OR SHORTNESS OF BREATH.   amLODipine (NORVASC) 5 MG tablet Take 1 tablet (5 mg total) by mouth daily.   atorvastatin (LIPITOR) 80 MG tablet Take 1 tablet (80 mg total) by mouth daily.   Blood Glucose Monitoring Suppl (ACCU-CHEK GUIDE) w/Device KIT Use to check blood sugar 2 time daily as needed   butalbital-acetaminophen-caffeine (FIORICET) 50-325-40 MG tablet Take 1 tablet by mouth every 4 (four) hours as needed for headache.   carvedilol (COREG) 12.5 MG tablet Take 1 tablet (12.5 mg total) by mouth 2 (two) times daily.   Continuous Blood Gluc Sensor (DEXCOM G7 SENSOR) MISC Use as directed. Apply 1 sensor every 10 days   cyclobenzaprine (FLEXERIL) 5 MG tablet 1 tablet every 8 hours as needed for muscle spasms   ENTRESTO 24-26 MG Take 1 tablet by mouth 2 (two) times daily.   ezetimibe (ZETIA) 10 MG tablet Take 1 tablet (10 mg total) by mouth daily.   glucose blood (ACCU-CHEK GUIDE) test strip Check blood sugar 2 times daily as needed   insulin aspart (NOVOLOG FLEXPEN) 100 UNIT/ML FlexPen Max daily 30 units   Insulin Glargine (BASAGLAR KWIKPEN) 100 UNIT/ML Inject 70 Units into the skin daily.   Insulin Pen Needle 32G X 4 MM MISC 1  Device by Does not apply route in the morning, at noon, in the evening, and at bedtime.   JARDIANCE 25 MG TABS tablet Take 1 tablet (25 mg total) by mouth daily.   lactulose (CHRONULAC) 10 GM/15ML solution Take 30 mLs (20 g total) by mouth daily as needed for mild constipation.   Lancets (ACCU-CHEK MULTICLIX) lancets Check sugar 2 times daily as needed.   levocetirizine (XYZAL) 5 MG tablet Take 1 tablet (5 mg total) by mouth daily at 2 PM.   methylPREDNISolone (MEDROL DOSEPAK) 4 MG TBPK tablet Take as directed (Patient not taking: Reported on 09/20/2023)   naproxen (NAPROSYN) 500 MG tablet Take 1 tablet (500 mg total) by mouth 2 (two) times daily with a meal.   nitroGLYCERIN (NITROSTAT) 0.4 MG SL tablet PLACE 1 TABLET UNDER THE TONGUE EVERY 5 MINUTES AS  NEEDED FOR CHEST PAIN.   Rimegepant Sulfate (NURTEC) 75 MG TBDP Take 1 tablet (75 mg total) by mouth daily as needed. Max of every other day prn   spironolactone (ALDACTONE) 25 MG tablet Take 1 tablet (25 mg total) by mouth daily.   triamcinolone cream (KENALOG) 0.1 % Apply 1 Application topically 2 (two) times daily. (Patient not taking: Reported on 09/20/2023)   No facility-administered medications prior to visit.    Review of Systems  Constitutional:  Negative for chills, fatigue and fever.  Respiratory:  Negative for shortness of breath and wheezing.   Musculoskeletal:  Positive for back pain and gait problem.  Neurological:  Positive for dizziness, weakness and light-headedness.    Last CBC Lab Results  Component Value Date   WBC 8.4 07/03/2023   HGB 10.7 (L) 07/03/2023   HCT 31.7 (L) 07/03/2023   MCV 87.6 07/03/2023   MCH 29.6 07/03/2023   RDW 13.1 07/03/2023   PLT 216 07/03/2023   Last metabolic panel Lab Results  Component Value Date   GLUCOSE 224 (H) 07/23/2023   NA 137 07/23/2023   K 4.7 07/23/2023   CL 104 07/23/2023   CO2 22 07/23/2023   BUN 27 (H) 07/23/2023   CREATININE 1.15 (H) 07/23/2023   EGFR 57 (L) 07/23/2023   CALCIUM 9.6 07/23/2023   PROT 6.9 03/09/2023   ALBUMIN 3.6 09/17/2021   LABGLOB 3.7 02/28/2016   AGRATIO 1.1 (L) 02/28/2016   BILITOT 1.2 03/09/2023   ALKPHOS 61 09/17/2021   AST 15 03/09/2023   ALT 18 03/09/2023   ANIONGAP 8 07/03/2023        Objective    BP 104/62   Pulse 94   Temp (!) 97.5 F (36.4 C) (Oral)   Resp 16   Ht 5\' 9"  (1.753 m)   Wt 167 lb 6.4 oz (75.9 kg)   LMP  (LMP Unknown)   SpO2 99%   BMI 24.72 kg/m     Physical Exam Vitals reviewed.  Constitutional:      General: She is awake.     Appearance: Normal appearance. She is well-developed and well-groomed.  HENT:     Head: Normocephalic and atraumatic.  Eyes:     General: Lids are normal. Gaze aligned appropriately.     Extraocular Movements: Extraocular  movements intact.     Conjunctiva/sclera: Conjunctivae normal.  Pulmonary:     Effort: Pulmonary effort is normal.  Musculoskeletal:     Right shoulder: Normal strength.     Left shoulder: Normal strength.     Right hand: Normal strength.     Left hand: Normal strength.     Cervical  back: Normal range of motion. No pain with movement. Normal range of motion.     Thoracic back: Normal range of motion.     Left hip: Normal range of motion. Decreased strength.     Comments: Left leg  Strength is 3/5 with hip flexion and 4/5 with plantar flexion/dorsiflexion  Right leg 4/5 with hip flexion and 5/5 with plantar flexion/dorsiflexion   Shoulder strength is 5/5 and grip strength 5/5 bilaterally   Neurological:     Mental Status: She is alert.  Psychiatric:        Behavior: Behavior is cooperative.      No results found for any visits on 09/20/23.  Assessment & Plan      Return in about 2 weeks (around 10/04/2023) for weakness of extremity, back pain .      Problem List Items Addressed This Visit       Cardiovascular and Mediastinum   Migraine headache without aura   Relevant Orders   Ambulatory referral to Neurology     Other   Low back pain   Relevant Orders   Ambulatory referral to Neurology   Weakness of extremity - Primary    Acute, new concern Patient reports new onset of weakness in her left leg along with intermittent tremors Strength is notably decreased and left leg with hip flexion and plantar/dorsiflexion compared to the right Suspect this might be secondary to her low back pain and likely sciatic nerve impingement but will place referral to neurology for further evaluation I do recommend that she proceeds and uses the Medrol Dosepak to relieve inflammation and takes naproxen, cyclobenzaprine as needed Recommend that she continues with physical therapy Will place work note for light duty to assist with recovery Reviewed ED and return precautions Follow up as  needed for persistent or progressing symptoms        Relevant Orders   Ambulatory referral to Neurology     Return in about 2 weeks (around 10/04/2023) for weakness of extremity, back pain .   I, Joretta Eads E Ygnacio Fecteau, PA-C, have reviewed all documentation for this visit. The documentation on 09/20/23 for the exam, diagnosis, procedures, and orders are all accurate and complete.   Jacquelin Hawking, MHS, PA-C [facility]

## 2023-09-20 NOTE — Transitions of Care (Post Inpatient/ED Visit) (Signed)
09/20/2023  Name: Sarah Duffy MRN: [id number] DOB: [birth year]  Today's TOC FU Call Status: Today's TOC FU Call Status:: Successful TOC FU Call Completed TOC FU Call Complete Date: 09/20/23 Patient's Name and Date of Birth confirmed.  Transition Care Management Follow-up Telephone Call Date of Discharge: 09/19/23 Discharge Facility: [facility]) Type of Discharge: Emergency Department Reason for ED Visit: Other: (low back pain) How have you been since you were released from the hospital?: Same Any questions or concerns?: No  Items Reviewed: Did you receive and understand the discharge instructions provided?: Yes Any new allergies since your discharge?: No Dietary orders reviewed?: NA Do you have support at home?: No People in Home: alone  Medications Reviewed Today: Medications Reviewed Today     Reviewed by Heidi Dach, RN (Registered Nurse) on 09/20/23 at 1410  Med List Status: <None>   Medication Order Taking? Sig Documenting Provider Last Dose Status Informant  albuterol (VENTOLIN HFA) 108 (90 Base) MCG/ACT inhaler [id number] Yes INHALE 2 PUFFS INTO THE LUNGS EVERY 4 HOURS AS NEEDED FOR WHEEZING OR SHORTNESS OF BREATH. Berniece Salines, FNP Taking Active   amLODipine (NORVASC) 5 MG tablet [id number] Yes Take 1 tablet (5 mg total) by mouth daily. [name], MD Taking Active   atorvastatin (LIPITOR) 80 MG tablet [id number] Yes Take 1 tablet (80 mg total) by mouth daily. [name], MD Taking Active   Blood Glucose Monitoring Suppl (ACCU-CHEK GUIDE) w/Device KIT [id number] Yes Use to check blood sugar 2 time daily as needed [name], [name], MD Taking Active   butalbital-acetaminophen-caffeine (FIORICET) 50-325-40 MG tablet [id number] Yes Take 1 tablet by mouth every 4 (four) hours as needed for headache. Irean Hong, MD Taking Active   carvedilol (COREG) 12.5 MG tablet [id number] Yes Take 1 tablet (12.5 mg total) by mouth 2 (two)  times daily. Creig Hines, NP Taking Active   Continuous Blood Gluc Sensor (DEXCOM G7 SENSOR) MISC [id number] Yes Use as directed. Apply 1 sensor every 10 days [provider] Taking Active   cyclobenzaprine (FLEXERIL) 5 MG tablet [id number] Yes 1 tablet every 8 hours as needed for muscle spasms Irean Hong, MD Taking Active   ENTRESTO 24-26 MG [id number] Yes Take 1 tablet by mouth 2 (two) times daily. [name], MD Taking Active   ezetimibe (ZETIA) 10 MG tablet [id number] Yes Take 1 tablet (10 mg total) by mouth daily. [name], MD Taking Active   glucose blood (ACCU-CHEK GUIDE) test strip [id number] Yes Check blood sugar 2 times daily as needed [name], [name], MD Taking Active   insulin aspart (NOVOLOG FLEXPEN) 100 UNIT/ML FlexPen [id number] Yes Max daily 30 units [name], [name], MD Taking Active   Insulin Glargine (BASAGLAR KWIKPEN) 100 UNIT/ML [id number] Yes Inject 70 Units into the skin daily. [name], [name], MD Taking Active   Insulin Pen Needle 32G X 4 MM MISC [id number] Yes 1 Device by Does not apply route in the morning, at noon, in the evening, and at bedtime. [name], [name], MD Taking Active   JARDIANCE 25 MG TABS tablet [id number] Yes Take 1 tablet (25 mg total) by mouth daily. [name], [name], MD Taking Active   lactulose [facility]) 10 GM/15ML solution [id number] Yes Take 30 mLs (20 g total) by mouth daily as needed for mild constipation. Irean Hong, MD Taking Active   Lancets [facility] MULTICLIX) lancets [id number] Yes Check sugar 2 times daily as needed. [name], [name], MD Taking Active  levocetirizine (XYZAL) 5 MG tablet [id number] Yes Take 1 tablet (5 mg total) by mouth daily at 2 PM. [name], MD Taking Active   methylPREDNISolone (MEDROL DOSEPAK) 4 MG TBPK tablet [id number] No Take as directed  Patient not taking: Reported on 09/20/2023   Irean Hong, MD Not Taking  Active            Med Note Ardelia Mems, Dayrin Stallone A   Mon Sep 20, 2023  2:09 PM) Will start taking today  naproxen (NAPROSYN) 500 MG tablet [id number] Yes Take 1 tablet (500 mg total) by mouth 2 (two) times daily with a meal. Irean Hong, MD Taking Active   nitroGLYCERIN (NITROSTAT) 0.4 MG SL tablet [id number] Yes PLACE 1 TABLET UNDER THE TONGUE EVERY 5 MINUTES AS NEEDED FOR CHEST PAIN. [name], MD Taking Active   Rimegepant Sulfate (NURTEC) 75 MG TBDP [id number] Yes Take 1 tablet (75 mg total) by mouth daily as needed. Max of every other day prn [name], MD Taking Active   spironolactone (ALDACTONE) 25 MG tablet [id number] Yes Take 1 tablet (25 mg total) by mouth daily. [name], MD Taking Active   triamcinolone cream (KENALOG) 0.1 % [id number] No Apply 1 Application topically 2 (two) times daily.  Patient not taking: Reported on 09/20/2023   [name], MD Not Taking Active             Home Care and Equipment/Supplies: Were Home Health Services Ordered?: No Any new equipment or medical supplies ordered?: No  Functional Questionnaire: Do you need assistance with bathing/showering or dressing?: No Do you need assistance with meal preparation?: No Do you need assistance with eating?: No Do you have difficulty maintaining continence: No Do you need assistance with getting out of bed/getting out of a chair/moving?: No Do you have difficulty managing or taking your medications?: No  Follow up appointments reviewed: PCP Follow-up appointment confirmed?: Yes Date of PCP follow-up appointment?: 09/20/23 Follow-up Provider: [facility] Follow-up appointment confirmed?: NA Do you need transportation to your follow-up appointment?: No Do you understand care options if your condition(s) worsen?: Yes-patient verbalized understanding    Estanislado Emms RN, BSN [place]  [facility] RN Care  Coordinator [phone]

## 2023-09-20 NOTE — Assessment & Plan Note (Signed)
Acute, new concern Patient reports new onset of weakness in her left leg along with intermittent tremors Strength is notably decreased and left leg with hip flexion and plantar/dorsiflexion compared to the right Suspect this might be secondary to her low back pain and likely sciatic nerve impingement but will place referral to neurology for further evaluation I do recommend that she proceeds and uses the Medrol Dosepak to relieve inflammation and takes naproxen, cyclobenzaprine as needed Recommend that she continues with physical therapy Will place work note for light duty to assist with recovery Reviewed ED and return precautions Follow up as needed for persistent or progressing symptoms

## 2023-09-22 ENCOUNTER — Other Ambulatory Visit: Payer: Self-pay

## 2023-09-22 ENCOUNTER — Telehealth: Payer: Self-pay | Admitting: Family Medicine

## 2023-09-22 DIAGNOSIS — G4440 Drug-induced headache, not elsewhere classified, not intractable: Secondary | ICD-10-CM

## 2023-09-22 DIAGNOSIS — G43009 Migraine without aura, not intractable, without status migrainosus: Secondary | ICD-10-CM

## 2023-09-22 DIAGNOSIS — G43011 Migraine without aura, intractable, with status migrainosus: Secondary | ICD-10-CM

## 2023-09-22 NOTE — Telephone Encounter (Signed)
Can we please refer her to where they are accepting her insurance?

## 2023-09-22 NOTE — Telephone Encounter (Signed)
Pt requesting a FULL MRI done before being seen by neurology.

## 2023-09-22 NOTE — Telephone Encounter (Signed)
Pt notified- verbalized understanding.

## 2023-09-22 NOTE — Telephone Encounter (Signed)
[facility] is calling because they received a referral for this pt from [facility] and wanted to let her know they don't accept her insurance.

## 2023-09-23 ENCOUNTER — Ambulatory Visit: Payer: Medicaid Other

## 2023-09-24 ENCOUNTER — Telehealth: Payer: Self-pay

## 2023-09-24 MED ORDER — SPIRONOLACTONE 25 MG PO TABS: 25.0000 mg | ORAL_TABLET | Freq: Every day | ORAL | 0 refills | Status: DC

## 2023-09-24 NOTE — Telephone Encounter (Signed)
Request for Spironolactone 25 mg tablets. 90 day refill sent to [facility] in [place].

## 2023-09-27 ENCOUNTER — Ambulatory Visit: Payer: Medicaid Other

## 2023-09-29 ENCOUNTER — Telehealth: Payer: Self-pay

## 2023-09-29 ENCOUNTER — Ambulatory Visit: Payer: Medicaid Other

## 2023-09-29 NOTE — Telephone Encounter (Signed)
Called pt to discuss missed appt. LVM regarding next appt and to call back to confirm or reschedule.

## 2023-10-01 NOTE — Telephone Encounter (Signed)
No answer from pt left dtailed vm

## 2023-10-04 ENCOUNTER — Ambulatory Visit: Payer: Medicaid Other

## 2023-10-04 ENCOUNTER — Ambulatory Visit: Payer: Medicaid Other | Admitting: Physician Assistant

## 2023-10-04 ENCOUNTER — Other Ambulatory Visit: Payer: Self-pay

## 2023-10-04 ENCOUNTER — Other Ambulatory Visit: Payer: Self-pay | Admitting: *Deleted

## 2023-10-04 ENCOUNTER — Encounter: Payer: Self-pay | Admitting: Physician Assistant

## 2023-10-04 VITALS — BP 112/64 | HR 92 | Temp 97.5°F | Resp 16 | Ht 69.0 in | Wt 175.6 lb

## 2023-10-04 DIAGNOSIS — Z23 Encounter for immunization: Secondary | ICD-10-CM

## 2023-10-04 DIAGNOSIS — E1159 Type 2 diabetes mellitus with other circulatory complications: Secondary | ICD-10-CM

## 2023-10-04 DIAGNOSIS — G8929 Other chronic pain: Secondary | ICD-10-CM

## 2023-10-04 DIAGNOSIS — M5442 Lumbago with sciatica, left side: Secondary | ICD-10-CM

## 2023-10-04 DIAGNOSIS — R29898 Other symptoms and signs involving the musculoskeletal system: Secondary | ICD-10-CM | POA: Diagnosis not present

## 2023-10-04 MED ORDER — NAPROXEN 500 MG PO TABS
500.0000 mg | ORAL_TABLET | Freq: Two times a day (BID) | ORAL | 0 refills | Status: DC
Start: 2023-10-04 — End: 2023-10-27

## 2023-10-04 MED ORDER — CYCLOBENZAPRINE HCL 5 MG PO TABS
5.0000 mg | ORAL_TABLET | Freq: Three times a day (TID) | ORAL | 0 refills | Status: DC | PRN
Start: 2023-10-04 — End: 2023-10-04

## 2023-10-04 MED ORDER — NAPROXEN 500 MG PO TABS: 500.0000 mg | ORAL_TABLET | Freq: Two times a day (BID) | ORAL | 0 refills | Status: DC

## 2023-10-04 MED ORDER — CYCLOBENZAPRINE HCL 5 MG PO TABS: 5.0000 mg | ORAL_TABLET | Freq: Three times a day (TID) | ORAL | 0 refills | Status: DC | PRN

## 2023-10-04 NOTE — Patient Instructions (Addendum)
I have sent in a refill of your Naproxen - please do not taken other NSAIDs while taking this (Ibuprofen, Advil, Aleve)   You can take Tylenol as needed in between the doses of Naproxen The flexeril can make you sleepy so please be aware of this when taking it  Continue with physical therapy and the warm compresses You can try using Lidocaine patches on the area as well as Voltaren gel for some added relief

## 2023-10-04 NOTE — Patient Outreach (Signed)
Medicaid Managed Care   Nurse Care Manager Note  10/04/2023 Name:  Sarah Duffy MRN:  [id number] DOB:  [birth year]  Sarah Duffy is an 52 y.o. year old female who is a primary patient of Sarah Cory, MD.  The [facility] Managed Care Coordination team was consulted for assistance with:    DMII  Sarah Duffy was given information about Medicaid Managed Care Coordination team services today. Sarah Duffy Patient agreed to services and verbal consent obtained.  Engaged with patient by telephone for follow up visit in response to provider referral for case management and/or care coordination services.   Assessments/Interventions:  Review of past medical history, allergies, medications, health status, including review of consultants reports, laboratory and other test data, was performed as part of comprehensive evaluation and provision of chronic care management services.  SDOH (Social Determinants of Health) assessments and interventions performed: SDOH Interventions    Flowsheet Row Patient Outreach Telephone from 05/24/2023 in [place] POPULATION HEALTH DEPARTMENT Patient Outreach Telephone from 04/13/2023 in [place] POPULATION HEALTH DEPARTMENT Patient Outreach Telephone from 04/05/2023 in [place] POPULATION HEALTH DEPARTMENT Office Visit from 04/29/2021 in [facility] Office Visit from 02/11/2021 in [facility]  SDOH Interventions       Housing Interventions -- -- -- Other (Comment)  Sarah Duffy got some information from pre-op] --  Transportation Interventions -- -- Intervention Not Indicated -- --  Utilities Interventions -- Intervention Not Indicated -- -- --  Depression Interventions/Treatment  -- -- -- -- Medication  Stress Interventions Offered [employer], Provide Counseling -- [employer], Provide Counseling -- --       Care Plan  No Known Allergies  Medications Reviewed Today    Medications were not reviewed in this encounter     Patient Active Problem List   Diagnosis Date Noted   Weakness of extremity 09/20/2023   Arthropathy of lumbar facet joint 07/29/2023   Low back pain 07/29/2023   Pain in finger of left hand 07/29/2023   Sacroiliac joint pain 07/29/2023   Contact dermatitis and eczema 06/08/2023   Dysphagia 03/23/2023   Asthma, well controlled 03/09/2023   Depression, major, in remission (HCC) 03/09/2023   Perennial allergic rhinitis with seasonal variation 03/09/2023   Type 2 diabetes mellitus with diabetic polyneuropathy, with long-term current use of insulin (HCC) 08/31/2022   Type 2 diabetes mellitus with retinopathy, with long-term current use of insulin (HCC) 08/31/2022   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(HCC) 08/31/2022   Hypertension 06/11/2022   Ischemic cardiomyopathy 06/11/2022   Coronary artery disease 06/11/2022   Chronic systolic heart failure (HCC) 06/11/2022   Angina pectoris associated with type 2 diabetes mellitus (HCC) 01/05/2022   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 edema associated with type 2 diabetes mellitus (HCC) 05/01/2021   Cardiomyopathy (HCC) 04/14/2021   Depression, major, recurrent, mild (HCC) 09/25/2019   History of 2019 novel coronavirus disease (COVID-19) 06/23/2019   Migraine headache without aura 01/14/2018   Type II diabetes mellitus (HCC) 05/22/2017   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 associated with type 2 diabetes mellitus (HCC) 05/21/2016   Severe nonproliferative diabetic retinopathy of right eye without macular edema associated with type 2 diabetes mellitus (HCC) 05/21/2016   Hyperlipidemia LDL goal <70 03/10/2016   MI (mitral incompetence) 02/25/2016   Insomnia 02/25/2016   Anxiety 09/04/2014    Conditions to be addressed/monitored per PCP order:  DMII  Care Plan : RN Care  Manager Plan of Care  Updates made by Heidi Dach, RN since 10/04/2023 12:00 AM     Problem: Health Management needs related to DMII      Long-Range Goal: Development of Plan of Care to address Health Management needs related to DMII   Start Date: 04/13/2023  Expected End Date: 10/14/2023  Note:   Current Barriers:  Chronic Disease Management support and education needs related to DMII-Sarah Duffy has new low back pain and left leg weakness. Continues to have low back pain. Utilizing heat to manage pain. Seen by PCP today. Scheduled with Neurolgy 01/05/23.  RNCM Clinical Goal(s):  Patient will verbalize understanding of plan for management of DMII as evidenced by patient reports take all medications exactly as prescribed and will call provider for medication related questions as evidenced by patient reports    attend all scheduled medical appointments: Diabetes Education 10/15/23, Neurology 01/05/23 as evidenced by provider documentation in EMR        continue to work with RN Care Manager and/or Social Worker to address care management and care coordination needs related to DMII as evidenced by adherence to CM Team Scheduled appointments     through collaboration with Medical illustrator, provider, and care team.   Interventions: Inter-disciplinary care team collaboration (see longitudinal plan of care) Evaluation of current treatment plan related to  self management and patient's adherence to plan as established by provider Provided therapeutic listening Advised patient to follow up with PCP regarding referral to Pain Management if no relief with newly prescribed medicaition Advised patient to look in to orthotic shoe inserts to help with back pain Provided patient with education on managing constipation   Diabetes:  (Status: Goal on Track (progressing): YES.) Long Term Goal   Lab Results  Component Value Date   HGBA1C 8.4 (A) 06/21/2023   @ Assessed patient's understanding of A1c goal: <7% Provided education to patient about basic DM  disease process; Reviewed prescribed diet with patient low carb/MyPlate method, provided examples of food choices; Discussed plans with patient for ongoing care management follow up and provided patient with direct contact information for care management team;      Reviewed scheduled/upcoming provider appointments including: see above list;         Assessed social determinant of health barriers;        Discussed healthy food choices and provided with education Reviewed BS readings as reported by patient-patient reports BS readings improving    Patient Goals/Self-Care Activities: Take medications as prescribed   Attend all scheduled provider appointments Call provider office for new concerns or questions  drink 6 to 8 glasses of water each day fill half of plate with vegetables manage portion size       Follow Up:  Patient agrees to Care Plan and Follow-up.  Plan: The Managed Medicaid care management team will reach out to the patient again over the next 60 days.  Date/time of next scheduled RN care management/care coordination outreach:  12/23 at 11:15am  Estanislado Emms RN, BSN [place]  [facility] RN Care Coordinator [phone]

## 2023-10-04 NOTE — Assessment & Plan Note (Signed)
Acute on chronic, ongoing  Minimal improvement with Medrol dose pack, limited course of Naproxen and Flexeril from ED Reviewed imaging results of lumbar spine from 09/19/23- no signs of significant abnormality or injury  Will proceed to refill Naproxen and Flexeril for pain management- if not improving over the next 2 weeks with PT,may need to refer to pain management while she is waiting for Neurology apt  Recommend above medication management along with Tylenol and Lidocaine patches and that she continues with PT  Will get CT lumbar imaging as well for further evaluation as this appears to be ongoing since Aug without notable improvement Follow up as needed for persistent or progressing symptoms

## 2023-10-04 NOTE — Patient Instructions (Signed)
Visit Information  [name] was given information about Medicaid Managed Care team care coordination services as a part of their [facility] Community Plan Medicaid benefit. Sarah Duffy verbally consented to engagement with the [facility] Managed Care team.   If you are experiencing a medical emergency, please call 911 or report to your local emergency department or urgent care.   If you have a non-emergency medical problem during routine business hours, please contact your provider's office and ask to speak with a nurse.   For questions related to your [facility], please call: [phone] or visit the homepage here: [email or web address]  If you would like to schedule transportation through your [facility], please call the following number at least 2 days in advance of your appointment: [phone]   Rides for urgent appointments can also be made after hours by calling Member Services.  Call the Behavioral Health Crisis Line at [phone]) [phone], at any time, 24 hours a day, 7 days a week. If you are in danger or need immediate medical attention call 911.  If you would like help to quit smoking, call 1-800-QUIT-NOW (914 370 8328) OR Espaol: 1-855-Djelo-Ya (1-324-401-0272) o para ms informacin haga clic aqu or Text READY to 536-644 to register via text  Sarah Duffy,   Please see education materials related to constipation provided by MyChart link.  Patient verbalizes understanding of instructions and care plan provided today and agrees to view in MyChart. Active MyChart status and patient understanding of how to access instructions and care plan via MyChart confirmed with patient.     Telephone follow up appointment with Managed Medicaid care management team member scheduled for:12/06/23 at 11:15am  Sarah Emms RN, BSN [place]  [facility]  Health RN Care Coordinator [phone]   Following is a copy of your plan of care:  Care Plan : RN Care Manager Plan of Care  Updates made by Heidi Dach, RN since 10/04/2023 12:00 AM     Problem: Health Management needs related to DMII      Long-Range Goal: Development of Plan of Care to address Health Management needs related to DMII   Start Date: 04/13/2023  Expected End Date: 10/14/2023  Note:   Current Barriers:  Chronic Disease Management support and education needs related to DMII-Ms. Borders has new low back pain and left leg weakness. Continues to have low back pain. Utilizing heat to manage pain. Seen by PCP today. Scheduled with Neurolgy 01/05/23.  RNCM Clinical Goal(s):  Patient will verbalize understanding of plan for management of DMII as evidenced by patient reports take all medications exactly as prescribed and will call provider for medication related questions as evidenced by patient reports    attend all scheduled medical appointments: Diabetes Education 10/15/23, Neurology 01/05/23 as evidenced by provider documentation in EMR        continue to work with RN Care Manager and/or Social Worker to address care management and care coordination needs related to DMII as evidenced by adherence to CM Team Scheduled appointments     through collaboration with Medical illustrator, provider, and care team.   Interventions: Inter-disciplinary care team collaboration (see longitudinal plan of care) Evaluation of current treatment plan related to  self management and patient's adherence to plan as established by provider Provided therapeutic listening Advised patient to follow up with PCP regarding referral to Pain Management if no relief with newly prescribed medicaition Advised patient to look in to orthotic shoe inserts to  help with back pain Provided patient with education on managing constipation   Diabetes:  (Status: Goal on Track (progressing): YES.) Long Term Goal    Lab Results  Component Value Date   HGBA1C 8.4 (A) 06/21/2023   @ Assessed patient's understanding of A1c goal: <7% Provided education to patient about basic DM disease process; Reviewed prescribed diet with patient low carb/MyPlate method, provided examples of food choices; Discussed plans with patient for ongoing care management follow up and provided patient with direct contact information for care management team;      Reviewed scheduled/upcoming provider appointments including: see above list;         Assessed social determinant of health barriers;        Discussed healthy food choices and provided with education Reviewed BS readings as reported by patient-patient reports BS readings improving    Patient Goals/Self-Care Activities: Take medications as prescribed   Attend all scheduled provider appointments Call provider office for new concerns or questions  drink 6 to 8 glasses of water each day fill half of plate with vegetables manage portion size

## 2023-10-04 NOTE — Progress Notes (Signed)
Acute Office Visit   Patient: Sarah Duffy   DOB: [birth year]   52 y.o. Female  MRN: [id number] Visit Date: 10/04/2023  Today's healthcare provider: Oswaldo Conroy Saiquan Hands, PA-C  Introduced myself to the patient as a Secondary school teacher and provided education on APPs in clinical practice.    Chief Complaint  Patient presents with   Extremity Weakness   Back Pain    Pt states sx not even better, neurology specialist appt till jan 2025.   Subjective    HPI HPI     Back Pain    Additional comments: Pt states sx not even better, neurology specialist appt till jan 2025.      Last edited by Forde Radon, CMA on 10/04/2023 10:29 AM.       Back Pain   She reports she is feeling a bit better but is still having left lower extremity weakness  Onset: progressive -started in April and has gotten worse  Duration: several months  Location: lower back and left leg  Radiation: left leg  Pain level and character: right now 7/10, constantly achy and can become sharp with certain movements  Other associated symptoms:intermittent numbness in left extremity  Interventions: she has tried Tylenol, Ibuprofen, warm compresses, voltaren gel, icyhot,  She states she has been going to PT (was with [facility] for about a month, then started with [facility] - has had about 3 sessions with them)  She has finished the Medrol dose pack and was taking the Naproxen and flexeril  She feels like the Naproxen and Flexeril were beneficial  Alleviating: heat seems to provide some relief  Aggravating: sometimes when she stands up very fast it will flare a bit.   She has noticed that when she first gets up in the AM she is very lightheaded and dizzy   She has called Neurology - unable to get apt until Jan 2025    Medications: Outpatient Medications Prior to Visit  Medication Sig   albuterol (VENTOLIN HFA) 108 (90 Base) MCG/ACT inhaler INHALE 2 PUFFS INTO THE LUNGS EVERY 4 HOURS AS NEEDED FOR WHEEZING OR SHORTNESS  OF BREATH.   amLODipine (NORVASC) 5 MG tablet Take 1 tablet (5 mg total) by mouth daily.   atorvastatin (LIPITOR) 80 MG tablet Take 1 tablet (80 mg total) by mouth daily.   Blood Glucose Monitoring Suppl (ACCU-CHEK GUIDE) w/Device KIT Use to check blood sugar 2 time daily as needed   butalbital-acetaminophen-caffeine (FIORICET) 50-325-40 MG tablet Take 1 tablet by mouth every 4 (four) hours as needed for headache.   carvedilol (COREG) 12.5 MG tablet Take 1 tablet (12.5 mg total) by mouth 2 (two) times daily.   Continuous Blood Gluc Sensor (DEXCOM G7 SENSOR) MISC Use as directed. Apply 1 sensor every 10 days   ENTRESTO 24-26 MG Take 1 tablet by mouth 2 (two) times daily.   ezetimibe (ZETIA) 10 MG tablet Take 1 tablet (10 mg total) by mouth daily.   glucose blood (ACCU-CHEK GUIDE) test strip Check blood sugar 2 times daily as needed   insulin aspart (NOVOLOG FLEXPEN) 100 UNIT/ML FlexPen Max daily 30 units   Insulin Glargine (BASAGLAR KWIKPEN) 100 UNIT/ML Inject 70 Units into the skin daily.   Insulin Pen Needle 32G X 4 MM MISC 1 Device by Does not apply route in the morning, at noon, in the evening, and at bedtime.   JARDIANCE 25 MG TABS tablet Take 1 tablet (25 mg total) by mouth daily.  lactulose (CHRONULAC) 10 GM/15ML solution Take 30 mLs (20 g total) by mouth daily as needed for mild constipation.   Lancets (ACCU-CHEK MULTICLIX) lancets Check sugar 2 times daily as needed.   levocetirizine (XYZAL) 5 MG tablet Take 1 tablet (5 mg total) by mouth daily at 2 PM.   methylPREDNISolone (MEDROL DOSEPAK) 4 MG TBPK tablet Take as directed   nitroGLYCERIN (NITROSTAT) 0.4 MG SL tablet PLACE 1 TABLET UNDER THE TONGUE EVERY 5 MINUTES AS NEEDED FOR CHEST PAIN.   Rimegepant Sulfate (NURTEC) 75 MG TBDP Take 1 tablet (75 mg total) by mouth daily as needed. Max of every other day prn   spironolactone (ALDACTONE) 25 MG tablet Take 1 tablet (25 mg total) by mouth daily.   triamcinolone cream (KENALOG) 0.1 %  Apply 1 Application topically 2 (two) times daily.   [DISCONTINUED] cyclobenzaprine (FLEXERIL) 5 MG tablet 1 tablet every 8 hours as needed for muscle spasms   [DISCONTINUED] naproxen (NAPROSYN) 500 MG tablet Take 1 tablet (500 mg total) by mouth 2 (two) times daily with a meal.   No facility-administered medications prior to visit.    Review of Systems  Musculoskeletal:  Positive for back pain.  Neurological:  Positive for dizziness and numbness.        Objective    BP 112/64   Pulse 92   Temp (!) 97.5 F (36.4 C) (Temporal)   Resp 16   Ht 5\' 9"  (1.753 m)   Wt 175 lb 9.6 oz (79.7 kg)   LMP  (LMP Unknown)   SpO2 98%   BMI 25.93 kg/m     Physical Exam Vitals reviewed.  Constitutional:      General: She is awake.     Appearance: Normal appearance. She is well-developed and well-groomed.  HENT:     Head: Normocephalic and atraumatic.  Pulmonary:     Effort: Pulmonary effort is normal.  Musculoskeletal:     Cervical back: No swelling, deformity or bony tenderness. No pain with movement. Normal range of motion.     Thoracic back: No swelling, deformity, tenderness or bony tenderness. Normal range of motion.     Left hip: No tenderness or bony tenderness. Normal range of motion.     Comments: No obvious step offs or abnormalities to palpation along spine SI joints were non tender Mildly antalgic gait present lumbar flexion and extension intact, thoracic lateral rotation and flexion appear intact today   Neurological:     Mental Status: She is alert and oriented to person, place, and time.     GCS: GCS eye subscore is 4. GCS verbal subscore is 5. GCS motor subscore is 6.     Cranial Nerves: No cranial nerve deficit, dysarthria or facial asymmetry.     Motor: No tremor or abnormal muscle tone.     Gait: Gait abnormal.  Psychiatric:        Attention and Perception: Attention and perception normal.        Mood and Affect: Mood and affect normal.        Speech: Speech  normal.        Behavior: Behavior normal. Behavior is cooperative.       No results found for any visits on 10/04/23.  Assessment & Plan      No follow-ups on file.       Problem List Items Addressed This Visit       Other   Low back pain    Acute on chronic, ongoing  Minimal improvement with Medrol dose pack, limited course of Naproxen and Flexeril from ED Reviewed imaging results of lumbar spine from 09/19/23- no signs of significant abnormality or injury  Will proceed to refill Naproxen and Flexeril for pain management- if not improving over the next 2 weeks with PT,may need to refer to pain management while she is waiting for Neurology apt  Recommend above medication management along with Tylenol and Lidocaine patches and that she continues with PT  Will get CT lumbar imaging as well for further evaluation as this appears to be ongoing since Aug without notable improvement Follow up as needed for persistent or progressing symptoms        Relevant Medications   naproxen (NAPROSYN) 500 MG tablet   cyclobenzaprine (FLEXERIL) 5 MG tablet   Other Relevant Orders   CT Lumbar Spine Wo Contrast   Weakness of extremity - Primary    Acute on chronic, ongoing She reports ongoing intermittent left sided numbness and weakness for the past few weeks that is not getting better despite interventions Will get CT lumbar to evaluate further Recommend she continues with PT as directed and she has apt with Neurology scheduled Scripts for Naproxen and Flexeril sent in as these seemed to provide some relief. If not improving with PT and medications, may need to be referred to Pain management while waiting on Neurology Follow up as needed for persistent or progressing symptoms         Relevant Orders   CT Lumbar Spine Wo Contrast   Other Visit Diagnoses     Need for immunization against influenza       Relevant Orders   Flu vaccine trivalent PF, 6mos and  older(Flulaval,Afluria,Fluarix,Fluzone) (Completed)        No follow-ups on file.   I, Laronn Devonshire E Dwayne Bulkley, PA-C, have reviewed all documentation for this visit. The documentation on 10/04/23 for the exam, diagnosis, procedures, and orders are all accurate and complete.   Jacquelin Hawking, MHS, PA-C [facility]

## 2023-10-04 NOTE — Assessment & Plan Note (Addendum)
Acute on chronic, ongoing She reports ongoing intermittent left sided numbness and weakness for the past few weeks that is not getting better despite interventions Will get CT lumbar to evaluate further Recommend she continues with PT as directed and she has apt with Neurology scheduled Scripts for Naproxen and Flexeril sent in as these seemed to provide some relief. If not improving with PT and medications, may need to be referred to Pain management while waiting on Neurology Follow up as needed for persistent or progressing symptoms

## 2023-10-05 NOTE — Telephone Encounter (Signed)
Copied from CRM [id number]. Topic: General - Other >> Oct 05, 2023  2:09 PM [place] B wrote: Reason for CRM: Marijean Niemann from [facility], needs to know from referral what exactly pt needs to be seen for , eye exam? Or what reason. Please call back

## 2023-10-05 NOTE — Telephone Encounter (Signed)
Returned call and clarified referral per Dr. Carlynn Purl.

## 2023-10-06 ENCOUNTER — Ambulatory Visit
Admission: RE | Admit: 2023-10-06 | Discharge: 2023-10-06 | Disposition: A | Discharge status: Home / Self Care | Payer: Medicaid Other | Source: Ambulatory Visit | Attending: Physician Assistant | Admitting: Physician Assistant

## 2023-10-06 DIAGNOSIS — G8929 Other chronic pain: Secondary | ICD-10-CM | POA: Diagnosis present

## 2023-10-06 DIAGNOSIS — R29898 Other symptoms and signs involving the musculoskeletal system: Secondary | ICD-10-CM | POA: Diagnosis present

## 2023-10-06 DIAGNOSIS — M5442 Lumbago with sciatica, left side: Secondary | ICD-10-CM | POA: Diagnosis present

## 2023-10-07 ENCOUNTER — Ambulatory Visit: Payer: Medicaid Other

## 2023-10-11 ENCOUNTER — Ambulatory Visit: Payer: Medicaid Other

## 2023-10-11 DIAGNOSIS — M6281 Muscle weakness (generalized): Secondary | ICD-10-CM

## 2023-10-11 DIAGNOSIS — M5459 Other low back pain: Secondary | ICD-10-CM

## 2023-10-11 NOTE — Therapy (Addendum)
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 02/2021 Echo: EF 35-40%, glob HK, GrI DD, nl RV fxn, mildly dil LA, mod MR; b. 06/2022 Echo: EF 50-55%, no rwma, GrII DD, nl RV fxn, RVSP , mildly dil LA, mild MR, AoV sclerosis.   CKD (chronic kidney disease), stage III (HCC)    Complication of anesthesia 03/23/2023   Possible gastroparesis based off food in stomach during EGD   Coronary artery disease    a. 04/2021 Cath: LM nl, LAD 70p/m, 27m, 80d, LCX 68m, OM1 40, OM2 99 (fills via collats from OM1), RCA 60d, RPDA 75.   COVID-19 2021   Hyperlipidemia LDL goal <70    Hypertension    Ischemic cardiomyopathy    a. 02/2021 Echo: EF 35-40%, glob HK, GrI DD; b. 06/2022 Echo: EF 50-55%.   Moderate mitral regurgitation    a. 02/2021 Echo: Mod MR.   MRSA infection within last 3 months 02/25/2016   Osteomyelitis of foot (HCC) 08/26/2016   Type II diabetes mellitus (HCC)    Past Surgical History:  Procedure Laterality Date   CHOLECYSTECTOMY  1999   COLONOSCOPY WITH PROPOFOL N/A 03/25/2021   Procedure: COLONOSCOPY WITH PROPOFOL;  Surgeon: [name], MD;  Location: [facility] ENDOSCOPY;  Service: Gastroenterology;  Laterality: N/A;   COLONOSCOPY WITH PROPOFOL N/A 04/17/2021   Procedure: COLONOSCOPY WITH PROPOFOL;  Surgeon: [name], MD;  Location: [facility] ENDOSCOPY;  Service: Gastroenterology;  Laterality: N/A;    ESOPHAGOGASTRODUODENOSCOPY (EGD) WITH PROPOFOL N/A 03/25/2021   Procedure: ESOPHAGOGASTRODUODENOSCOPY (EGD) WITH PROPOFOL;  Surgeon: [name], MD;  Location: [facility] ENDOSCOPY;  Service: Gastroenterology;  Laterality: N/A;   ESOPHAGOGASTRODUODENOSCOPY (EGD) WITH PROPOFOL N/A 04/17/2021   Procedure: ESOPHAGOGASTRODUODENOSCOPY (EGD) WITH PROPOFOL;  Surgeon: [name], MD;  Location: [facility] ENDOSCOPY;  Service: Gastroenterology;  Laterality: N/A;   ESOPHAGOGASTRODUODENOSCOPY (EGD) WITH PROPOFOL N/A 03/23/2023   Procedure: ESOPHAGOGASTRODUODENOSCOPY (EGD) WITH PROPOFOL;  Surgeon: [name], MD;  Location: [facility] ENDOSCOPY;  Service: Gastroenterology;  Laterality: N/A;   RIGHT/LEFT HEART CATH AND CORONARY ANGIOGRAPHY Bilateral 04/14/2021   Procedure: RIGHT/LEFT HEART CATH AND CORONARY ANGIOGRAPHY;  Surgeon: Yvonne Kendall, MD;  Location: [facility] INVASIVE CV LAB;  Service: Cardiovascular;  Laterality: Bilateral;   TOE SURGERY Left 02/07/2016   Pinky Toe   Patient Active Problem List   Diagnosis Date Noted   Weakness of extremity 09/20/2023   Arthropathy of lumbar facet joint 07/29/2023   Low back pain 07/29/2023   Pain in finger of left hand 07/29/2023   Sacroiliac joint pain 07/29/2023   Contact dermatitis and eczema 06/08/2023   Dysphagia 03/23/2023   Asthma, well controlled 03/09/2023   Depression, major, in remission (HCC) 03/09/2023   Perennial allergic rhinitis with seasonal variation 03/09/2023   Type 2 diabetes mellitus with diabetic polyneuropathy, with long-term current use of  insulin (HCC) 08/31/2022   Type 2 diabetes mellitus with retinopathy, with long-term current use of insulin (HCC) 08/31/2022   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(HCC) 08/31/2022   Hypertension 06/11/2022   Ischemic cardiomyopathy 06/11/2022   Coronary artery disease 06/11/2022   Chronic systolic heart failure (HCC) 06/11/2022   Angina pectoris associated with type 2 diabetes mellitus (HCC) 01/05/2022    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 edema associated with type 2 diabetes mellitus (HCC) 05/01/2021   Cardiomyopathy (HCC) 04/14/2021   Depression, major, recurrent, mild (HCC) 09/25/2019   History of 2019 novel coronavirus disease (COVID-19) 06/23/2019   Migraine headache without aura 01/14/2018   Type II diabetes mellitus (HCC) 05/22/2017   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 associated with type 2 diabetes mellitus (HCC) 05/21/2016   Severe nonproliferative diabetic retinopathy of right eye without macular edema associated with type 2 diabetes mellitus (HCC) 05/21/2016   Hyperlipidemia LDL goal <70 03/10/2016   MI (mitral incompetence) 02/25/2016   Insomnia 02/25/2016   Anxiety 09/04/2014    PCP: [name], MD  REFERRING PROVIDER: [name], MD  REFERRING DIAG:  M54.50 (ICD-10-CM) - Intermittent low back pain    Rationale for Evaluation and Treatment: Rehabilitation  THERAPY DIAG:  Other low back pain  Muscle weakness (generalized)  ONSET DATE: June, 2024   SUBJECTIVE:                                                                                                                                                                                           SUBJECTIVE STATEMENT:  Pt reports a 7/10 in the low back. She reports she has received another CT scan in the low back and is awaiting results. PERTINENT HISTORY:  Pt presents to OPPT with LBP and balance deficits that started in June, 2024. During this time she was having very severe migraines w/ light sensitivity and was receiving medical treatment. Over the past several months,following a viral infection, pt has reported speech difficulties with certain words, weight loss of 10-15# and balance deficits in addition to her back pain. Also with ED/urgent care visit for nausea and recent rash on LE's and difficulty with mobilizing her LLE due to  significant weakness. Pt has seen her PCP and an orthopedic specialist regarding her LBP. Pt reports increased difficulty with ascending stairs, and getting in and out of a car or bathtub. Pt reports her pain at best is a 3/10NPS and at worse is an 8/10. Pt's LBP is worse in the morning, and gets better throughout the day. Pt's pain is relieved with heat and Tylenol.  PAIN:  Are you having pain? Yes: NPRS scale: 7/10 Pain location: Bilat para-spinals  Pain description: Dull and achey, sharp at night into R buttocks  Aggravating factors: Going up stairs, getting in and out of a car, getting in and out of the tub  Relieving factors: Heat, Tylenol  Worst pain 8/10 LBP is worse in the morning, less throughout the day  PRECAUTIONS: Fall  RED FLAGS: None   WEIGHT BEARING RESTRICTIONS: No  FALLS:  Has patient fallen in last 6 months? No  LIVING ENVIRONMENT:  Stairs: Yes: External: 3 steps; on left going up   OCCUPATION: [employer]   PLOF: Independent  PATIENT GOALS: Be able to move better  NEXT MD VISIT: 09/20/23  OBJECTIVE:  Note: Objective measures were completed at Evaluation unless otherwise noted.  DIAGNOSTIC FINDINGS:  Pt reports X-rays taken at [facility], she states that there were no notable findings from x-rays.   PATIENT SURVEYS:  FOTO 43/60  SCREENING FOR RED FLAGS: Bowel or bladder incontinence: No   COGNITION: Overall cognitive status: Within functional limits for tasks assessed     SENSATION: WFL   PALPATION: No TTP noted.   LUMBAR ROM:   AROM eval  Flexion WFL  Extension WFL  Right lateral flexion WFL*  Left lateral flexion WFL*  Right rotation WFL  Left rotation WFL    (Blank rows = not tested)  LOWER EXTREMITY ROM: All LE ROM WFL   LOWER EXTREMITY MMT:    MMT Right eval Left eval  Hip flexion 3+ 2  Hip extension 2+ 2+  Hip abduction 3 3  Hip adduction 3 3  Hip internal rotation 3+ 3+  Hip external rotation 3 3  Knee  flexion 4 4  Knee extension 4 4  Ankle dorsiflexion 4 4-  Ankle plantarflexion 4 4   (Blank rows = not tested)  LUMBAR SPECIAL TESTS:   SI Compression/distraction test: Negative, FABER test: Negative, and FADDIR: (R) negative (L) positive Scour bilat: Negative, Thigh Thrust bilat: Negative  REFLEXES:   R/ L patellar tendon: areflexive; R/L achilles tendon: areflexive  FUNCTIONAL TESTS:  5 times sit to stand: 11.7 seconds Timed up and go (TUG): 8.28 seconds Functional gait assessment: 18/30  GAIT: Comments: Increased ER of LLE with ambulation, decreased ability to ambulate in a straight line  TODAY'S TREATMENT:                                                                                                                              DATE: 10/11/23  Beginning of session spent administering 5xSTS, TUG, and FGA. See clinical impression and goals section for details.  Reviewed pt's HEP: (reps/ sets/ frequencies)  STS from plinth x5 w/ no UE support Seated hip flexion RLE/LLE x5/ each side Sidelying clamshells RLE/LLE 2 x10/ each side  Sidelying reverse clamshells RLE/LLE 2 x10/ each side   PATIENT EDUCATION:  Education details: Educated pt on findings from evaluation (strength  deficits, subjective)  Person educated: Patient Education method: Explanation Education comprehension: verbalized understanding  HOME EXERCISE PROGRAM: Deferred to next session  ASSESSMENT:  CLINICAL IMPRESSION: Session focused on administering pt's 5xSTS, FGA, and TUG along with prescribing pt's HEP. Pt scored 11.7 seconds on the 5xSTS which is outside of her age- matched norm of 9.7 seconds. Pt performed FGA scoring an 18/30 which is indicative of increased falls risks being less than 22/30, noting the most difficulty with tandem walking and ambulating eyes closed. Pt scored 8.28 seconds in the TUG time, noting within age- matched/ gender norms. These results signify pt's decreased bilat LE strength  along with balance deficits that are limiting her functional mobility and ability to perform work- related tasks. Pt educated on HEP to emphasize bilat LE strengthening and verbalized and demonstrated understanding. Pt would continue to benefit from skilled PT interventions to address bilat LE strength and balance deficits.  OBJECTIVE IMPAIRMENTS: decreased mobility, difficulty walking, decreased strength, and pain.   ACTIVITY LIMITATIONS: carrying, lifting, squatting, stairs, and transfers  PARTICIPATION LIMITATIONS: driving, community activity, and occupation  PERSONAL FACTORS: Past/current experiences and Time since onset of injury/illness/exacerbation are also affecting patient's functional outcome.   REHAB POTENTIAL: Good  CLINICAL DECISION MAKING: Evolving/moderate complexity  EVALUATION COMPLEXITY: Moderate   GOALS: Goals reviewed with patient? Yes  SHORT TERM GOALS: Target date: 10/14/23  Pt will be independent with HEP to improve bilat LE strength and reduce low back pain with functional activities  Baseline: 09/16/23: Deferred to next session Goal status: INITIAL   LONG TERM GOALS: Target date: 11/11/23  Pt will improve FOTO to target score to demonstrate clinically significant improvement in functional mobility.   Baseline: 09/16/23: 43/60 Goal status: INITIAL  2.  Pt to improve 5x STS to < 9.7 seconds to display improved functional mobility and LE strength to meet age matched norms.  Baseline: 09/16/23: Deferred to next session 10/11/23: 11.7 seconds Goal status: INITIAL  3.  Pt to improve TUG time by at least 3.4 seconds for clinically significant improvement for decreased risk of falls.  Baseline: 09/16/23: Deferred to next session 10/11/23: 8.28 seconds Goal status: INITIAL  4.   Pt will improve FGA score by 4 points to display clinically significant improvement in balance.  Baseline: 09/16/23: Deferred to next session 10/11/23: 18/30 Goal status: INITIAL  5.   Pt will improve all bilat LE AROM to </= 4/5 MMT to display functional improvements in bilat LE strength.  Baseline: 09/16/23-  MMT Right eval Left eval  Hip flexion 3+ 2  Hip extension 2+ 2+  Hip abduction 3 3  Hip adduction 3 3  Hip internal rotation 3+ 3+  Hip external rotation 3 3  Knee flexion 4 4  Knee extension 4 4  Ankle dorsiflexion 4 4-  Ankle plantarflexion 4 4   Goal status: INITIAL   PLAN:  PT FREQUENCY: 1x/week  PT DURATION: 8 weeks  PLANNED INTERVENTIONS: Therapeutic exercises, Therapeutic activity, Neuromuscular re-education, Balance training, Gait training, Patient/Family education, Self Care, Joint mobilization, Stair training, Electrical stimulation, Spinal manipulation, Spinal mobilization, Cryotherapy, Moist heat, and Re-evaluation.  PLAN FOR NEXT SESSION: Reassess tolerance to HEP, progress balance and bilat LE strengthening exercises.   Lovie Macadamia, SPT   Delphia Grates. Fairly IV, PT, DPT Physical Therapist- [place]  [facility]  10/11/2023, 2:45 PM

## 2023-10-11 NOTE — Progress Notes (Addendum)
[facility] - Clinic Note  10/19/2023   CHIEF COMPLAINT Patient presents for Retina Evaluation  HISTORY OF PRESENT ILLNESS: Sarah Duffy is a 52 y.o. female who presents to the clinic today for:  HPI     Retina Evaluation   In both eyes.  This started 10 years ago.  Associated Symptoms Floaters.  Negative for Flashes.  Context:  distance vision and mid-range vision.  I, the attending physician,  performed the HPI with the patient and updated documentation appropriately.        Comments   Patient is here today for a diabetic evaluation. She has had a history of injections OS at [facility]. She feels the vision is just not as good as it was. Her blood sugar was 97.      Last edited by Rennis Chris, MD on 10/19/2023 11:40 AM.    Pt is here on the referral of her PCP, Dr. Carlynn Purl, for a DM exam, pt states she used to be seen at [facility] in [place], she received one injection in her left eye in 2017, she states she got new insurance and then went to [facility] and had 2 injections in her left eye, she's never had injections in her right eye, she also had laser in her left eye, she states her A1c is coming down, it was 8.4 in July, she states she has had a hard time getting insurance to cover her medications in the past, pt states she was dx with diabetes about 15 years ago, she states her vision has decreased, things look "fuzzy", she has occasional floaters in her right eye, but nothing that lasts very long   Referring physician: Providence Crosby, PA-C [address] [place],  [place] [address]  HISTORICAL INFORMATION:  Selected notes from the MEDICAL RECORD NUMBER Referred by Jacquelin Hawking, PA-C for diabetic eye exam LEE:  Ocular Hx- PDR OU, previously managed by [facility] and [facility]-   CURRENT MEDICATIONS: No current outpatient medications on file. (Ophthalmic Drugs)   No current facility-administered medications for this visit.  (Ophthalmic Drugs)   Current Outpatient Medications (Other)  Medication Sig   albuterol (VENTOLIN HFA) 108 (90 Base) MCG/ACT inhaler INHALE 2 PUFFS INTO THE LUNGS EVERY 4 HOURS AS NEEDED FOR WHEEZING OR SHORTNESS OF BREATH.   amLODipine (NORVASC) 5 MG tablet Take 1 tablet (5 mg total) by mouth daily.   atorvastatin (LIPITOR) 80 MG tablet Take 1 tablet (80 mg total) by mouth daily.   Blood Glucose Monitoring Suppl (ACCU-CHEK GUIDE) w/Device KIT Use to check blood sugar 2 time daily as needed   butalbital-acetaminophen-caffeine (FIORICET) 50-325-40 MG tablet Take 1 tablet by mouth every 4 (four) hours as needed for headache.   carvedilol (COREG) 12.5 MG tablet Take 1 tablet (12.5 mg total) by mouth 2 (two) times daily.   Continuous Blood Gluc Sensor (DEXCOM G7 SENSOR) MISC Use as directed. Apply 1 sensor every 10 days   cyclobenzaprine (FLEXERIL) 5 MG tablet Take 1 tablet (5 mg total) by mouth 3 (three) times daily as needed for muscle spasms. 1 tablet every 8 hours as needed for muscle spasms   ENTRESTO 24-26 MG Take 1 tablet by mouth 2 (two) times daily.   ezetimibe (ZETIA) 10 MG tablet Take 1 tablet (10 mg total) by mouth daily.   glucose blood (ACCU-CHEK GUIDE) test strip Check blood sugar 2 times daily as needed   insulin aspart (NOVOLOG FLEXPEN) 100  UNIT/ML FlexPen Max daily 30 units   Insulin Glargine (BASAGLAR KWIKPEN) 100 UNIT/ML Inject 70 Units into the skin daily.   Insulin Pen Needle 32G X 4 MM MISC 1 Device by Does not apply route in the morning, at noon, in the evening, and at bedtime.   JARDIANCE 25 MG TABS tablet Take 1 tablet (25 mg total) by mouth daily.   lactulose (CHRONULAC) 10 GM/15ML solution Take 30 mLs (20 g total) by mouth daily as needed for mild constipation.   Lancets (ACCU-CHEK MULTICLIX) lancets Check sugar 2 times daily as needed.   levocetirizine (XYZAL) 5 MG tablet Take 1 tablet (5 mg total) by mouth daily at 2 PM.   methylPREDNISolone (MEDROL DOSEPAK) 4 MG TBPK  tablet Take as directed   naproxen (NAPROSYN) 500 MG tablet Take 1 tablet (500 mg total) by mouth 2 (two) times daily with a meal.   nitroGLYCERIN (NITROSTAT) 0.4 MG SL tablet PLACE 1 TABLET UNDER THE TONGUE EVERY 5 MINUTES AS NEEDED FOR CHEST PAIN.   Rimegepant Sulfate (NURTEC) 75 MG TBDP Take 1 tablet (75 mg total) by mouth daily as needed. Max of every other day prn   spironolactone (ALDACTONE) 25 MG tablet Take 1 tablet (25 mg total) by mouth daily.   triamcinolone cream (KENALOG) 0.1 % Apply 1 Application topically 2 (two) times daily.   No current facility-administered medications for this visit. (Other)   REVIEW OF SYSTEMS: ROS   Positive for: Endocrine, Cardiovascular, Eyes, Respiratory, Psychiatric Last edited by Charlette Caffey, COT on 10/19/2023  9:15 AM.     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 02/2021 Echo: EF 35-40%, glob HK, GrI DD, nl RV fxn, mildly dil LA, mod MR; b. 06/2022 Echo: EF 50-55%, no rwma, GrII DD, nl RV fxn, RVSP , mildly dil LA, mild MR, AoV sclerosis.   CKD (chronic kidney disease), stage III (HCC)    Complication of anesthesia 03/23/2023   Possible gastroparesis based off food in stomach during EGD   Coronary artery disease    a. 04/2021 Cath: LM nl, LAD 70p/m, 12m, 80d, LCX 29m, OM1 40, OM2 99 (fills via collats from OM1), RCA 60d, RPDA 75.   COVID-19 2021   Hyperlipidemia LDL goal <70    Hypertension    Ischemic cardiomyopathy    a. 02/2021 Echo: EF 35-40%, glob HK, GrI DD; b. 06/2022 Echo: EF 50-55%.   Moderate mitral regurgitation    a. 02/2021 Echo: Mod MR.   MRSA infection within last 3 months 02/25/2016   Osteomyelitis of foot (HCC) 08/26/2016   Type II diabetes mellitus (HCC)    Past Surgical History:  Procedure Laterality Date   CHOLECYSTECTOMY  1999   COLONOSCOPY WITH PROPOFOL N/A 03/25/2021   Procedure:  COLONOSCOPY WITH PROPOFOL;  Surgeon: [name], MD;  Location: [facility] ENDOSCOPY;  Service: Gastroenterology;  Laterality: N/A;   COLONOSCOPY WITH PROPOFOL N/A 04/17/2021   Procedure: COLONOSCOPY WITH PROPOFOL;  Surgeon: [name], MD;  Location: [facility] ENDOSCOPY;  Service: Gastroenterology;  Laterality: N/A;   ESOPHAGOGASTRODUODENOSCOPY (EGD) WITH PROPOFOL N/A 03/25/2021   Procedure: ESOPHAGOGASTRODUODENOSCOPY (EGD) WITH PROPOFOL;  Surgeon: [name], MD;  Location: [facility] ENDOSCOPY;  Service: Gastroenterology;  Laterality: N/A;   ESOPHAGOGASTRODUODENOSCOPY (EGD) WITH PROPOFOL N/A 04/17/2021   Procedure: ESOPHAGOGASTRODUODENOSCOPY (EGD) WITH PROPOFOL;  Surgeon: [name], MD;  Location: [facility] ENDOSCOPY;  Service: Gastroenterology;  Laterality: N/A;  ESOPHAGOGASTRODUODENOSCOPY (EGD) WITH PROPOFOL N/A 03/23/2023   Procedure: ESOPHAGOGASTRODUODENOSCOPY (EGD) WITH PROPOFOL;  Surgeon: [name], MD;  Location: [facility] ENDOSCOPY;  Service: Gastroenterology;  Laterality: N/A;   RIGHT/LEFT HEART CATH AND CORONARY ANGIOGRAPHY Bilateral 04/14/2021   Procedure: RIGHT/LEFT HEART CATH AND CORONARY ANGIOGRAPHY;  Surgeon: Yvonne Kendall, MD;  Location: [facility] INVASIVE CV LAB;  Service: Cardiovascular;  Laterality: Bilateral;   TOE SURGERY Left 02/07/2016   Pinky Toe   FAMILY HISTORY Family History  Problem Relation Age of Onset   Diabetes Mother    Ulcers Mother    Heart disease Father    AAA (abdominal aortic aneurysm) Father    Diabetes Father    Hypertension Father    Stroke Father    Alzheimer's disease Father    Heart attack Sister    Seizures Brother    Diabetes Maternal Grandmother    Breast cancer Maternal Grandmother    SOCIAL HISTORY Social History   Tobacco Use   Smoking status: Never   Smokeless tobacco: Never  Vaping Use   Vaping status: Never Used  Substance Use Topics   Alcohol use: No    Alcohol/week: 0.0 standard drinks of alcohol   Drug use: No       OPHTHALMIC EXAM:  Base  Eye Exam     Visual Acuity (Snellen - Linear)       Right Left   Dist [place] 20/40 +1 20/40 +1   Dist ph [place] NI 20/30 +1         Tonometry (Tonopen, 9:24 AM)       Right Left   Pressure 11 14         Pupils       Dark Light Shape React APD   Right 3 2 Round Brisk None   Left 3 2 Round Brisk None         Visual Fields       Left Right    Full Full         Extraocular Movement       Right Left    Full, Ortho Full, Ortho         Neuro/Psych     Oriented x3: Yes         Dilation     Both eyes: 1.0% Mydriacyl, 2.5% Phenylephrine @ 9:15 AM           Slit Lamp and Fundus Exam     Slit Lamp Exam       Right Left   Lids/Lashes Normal Normal   Conjunctiva/Sclera mild melanosis mild melanosis   Cornea trace PEE 1-2+ Punctate epithelial erosions   Anterior Chamber deep and clear deep and clear   Iris Round and dilated, No NVI Round and dilated, No NVI   Lens 1+ Cortical cataract 1-2+ Cortical cataract, 1-2+ Posterior subcapsular cataract   Anterior Vitreous Vitreous syneresis, +RBCs, blood stained vitreous condensations Vitreous syneresis, +RBCs         Fundus Exam       Right Left   Disc Pink and Sharp, no NVD Pink and Sharp, no NVD   C/D Ratio 0.3 0.2   Macula hazy view, Flat, scattered MA / DBH, +fibrosis Flat, Blunted foveal reflex, scattered MA, DBH and exudates   Vessels attenuated, Tortuous, +fibrosis along temporal arcades attenuated, Tortuous, +NVE   Periphery hazy view, grossly attached, scattered DBH, tractional fibrosis along IT arcades Attached, scattered MA / DBH, scattered PRP with room for fill in  Refraction     Manifest Refraction       Sphere Cylinder Dist VA   Right -0.50 Sphere 20/30+2   Left -0.50 Sphere 20/40           IMAGING AND PROCEDURES  Imaging and Procedures for 10/19/2023  OCT, Retina - OU - Both Eyes       Right Eye Quality was good. Central Foveal Thickness: 256. Progression has no  prior data. Findings include no SRF, abnormal foveal contour, intraretinal hyper-reflective material, epiretinal membrane, intraretinal fluid (+vitreous opacities, focal tractional edema inferior periphery caught on widefield, mild ERM).   Left Eye Quality was good. Central Foveal Thickness: 251. Progression has no prior data. Findings include normal foveal contour, no SRF, intraretinal hyper-reflective material, intraretinal fluid, vitreomacular adhesion (mild scattered cystic changes).   Notes *Images captured and stored on drive  Diagnosis / Impression:  +DME OU OD: +vitreous opacities, focal tractional edema inferior periphery caught on widefield, mild ERM OS: mild scattered cystic changes  Clinical management:  See below  Abbreviations: NFP - Normal foveal profile. CME - cystoid macular edema. PED - pigment epithelial detachment. IRF - intraretinal fluid. SRF - subretinal fluid. EZ - ellipsoid zone. ERM - epiretinal membrane. ORA - outer retinal atrophy. ORT - outer retinal tubulation. SRHM - subretinal hyper-reflective material. IRHM - intraretinal hyper-reflective material      Fluorescein Angiography Optos (Transit OD)       Right Eye Progression has no prior data. Early phase findings include blockage, microaneurysm, retinal neovascularization, vascular perfusion defect. Mid/Late phase findings include blockage, leakage, microaneurysm, retinal neovascularization, vascular perfusion defect (Scattered punctate leaking NV, scattered blockage from [facility], scattered patches of vascular non-perfusion, leaking MA).   Left Eye Progression has no prior data. Early phase findings include leakage, staining, microaneurysm, retinal neovascularization, vascular perfusion defect. Mid/Late phase findings include leakage, staining, microaneurysm, retinal neovascularization, vascular perfusion defect (Scattered leaking NV -- greatest SN midzone, scattered patches of vascular non-perfusion, leaking MA,  scattered incomplete PRP).   Notes **Images stored on drive**  Impression: PDR OU OD: Scattered punctate leaking NV, scattered blockage from [facility], scattered patches of vascular non-perfusion, leaking MA OS: Scattered leaking NV -- greatest SN midzone, scattered patches of vascular non-perfusion, leaking MA, scattered incomplete PRP      Intravitreal Injection, Pharmacologic Agent - OD - Right Eye       Time Out 10/19/2023. 11:05 AM. Confirmed correct patient, procedure, site, and patient consented.   Anesthesia Topical anesthesia was used. Anesthetic medications included Lidocaine 4%, Proparacaine 0.5%.   Procedure Preparation included 5% betadine to ocular surface, eyelid speculum. A (32g) needle was used.   Injection: 1.25 mg Bevacizumab 1.25mg /0.13ml   Route: Intravitreal, Site: Right Eye   NDC: P3213405, Lot: [id number], Expiration date: 01/08/2024   Post-op Post injection exam found visual acuity of at least counting fingers. The patient tolerated the procedure well. There were no complications. The patient received written and verbal post procedure care education. Post injection medications were not given.      Intravitreal Injection, Pharmacologic Agent - OS - Left Eye       Time Out 10/19/2023. 11:09 AM. Confirmed correct patient, procedure, site, and patient consented.   Anesthesia Topical anesthesia was used. Anesthetic medications included Lidocaine 4%, Proparacaine 0.5%.   Procedure Preparation included 5% betadine to ocular surface, eyelid speculum. A (32g) needle was used.   Injection: 1.25 mg Bevacizumab 1.25mg /0.45ml   Route: Intravitreal, Site: Left Eye   NDC: P3213405, Lot: [id number], Expiration date:  01/24/2024   Post-op Post injection exam found visual acuity of at least counting fingers. The patient tolerated the procedure well. There were no complications. The patient received written and verbal post procedure care education. Post injection  medications were not given.           ASSESSMENT/PLAN:   ICD-10-CM   1. 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 edema associated with type 2 diabetes mellitus (HCC)  E11.3513 OCT, Retina - OU - Both Eyes    Intravitreal Injection, Pharmacologic Agent - OD - Right Eye    Intravitreal Injection, Pharmacologic Agent - OS - Left Eye    Bevacizumab (AVASTIN) SOLN 1.25 mg    Bevacizumab (AVASTIN) SOLN 1.25 mg    2. Encounter for long-term (current) use of insulin (HCC)  Z79.4     3. Long term (current) use of oral hypoglycemic drugs  Z79.84     4. Vitreous hemorrhage, right eye (HCC)  H43.11 Intravitreal Injection, Pharmacologic Agent - OD - Right Eye    Bevacizumab (AVASTIN) SOLN 1.25 mg    5. Essential hypertension  I10     6. Hypertensive retinopathy of both eyes  H35.033 Fluorescein Angiography Optos (Transit OD)    7. Combined forms of age-related cataract of both eyes  H25.813      1-4. Proliferative diabetic retinopathy, both eyes  - previously managed by [facility] and [facility]  - h/o anti-VEGF therapy and PRP OS  - last A1c 8.4 on 07.08.24 - The incidence, risk factors for progression, natural history and treatment options for diabetic retinopathy were discussed with patient.   - The need for close monitoring of blood glucose, blood pressure, and serum lipids, avoiding cigarette or any type of tobacco, and the need for long term follow up was also discussed with patient. - exam shows blood stained vit condensations and tractional fibrosis OD, scattered MA/DBH OU - FA (11.05.24) shows OD: Scattered punctate leaking NV, scattered blockage from [facility], scattered patches of vascular non-perfusion, leaking MA; OS: Scattered leaking NV -- greatest SN midzone, scattered patches of vascular non-perfusion, leaking MA, scattered incomplete PRP - OCT shows +diabetic macular edema, both eyes  - The natural history, pathology, and characteristics of diabetic macular edema  discussed with patient.  A generalized discussion of the major clinical trials concerning treatment of diabetic macular edema (ETDRS, DCT, SCORE, RISE / RIDE, and ongoing DRCR net studies) was completed.  This discussion included mention of the various approaches to treating diabetic macular edema (observation, laser photocoagulation, anti-VEGF injections with lucentis / Avastin / Eylea, steroid injections with Kenalog / Ozurdex, and intraocular surgery with vitrectomy).  The goal hemoglobin A1C of 6-7 was discussed, as well as importance of smoking cessation and hypertension control.  Need for ongoing treatment and monitoring were specifically discussed with reference to chronic nature of diabetic macular edema. - recommend IVA OU #1 today, 11.05.24 - pt wishes to proceed with injections - RBA of procedure discussed, questions answered - informed consent obtained and signed - see procedure note - f/u November 19th at 245 -- DFE/OCT, PRP OS -- unless OD has cleared - f/u 4 weeks (December 3rd) for injections OU  5,6. Hypertensive retinopathy OU - discussed importance of tight BP control - monitor  7. Mixed Cataract OU - The symptoms of cataract, surgical options, and treatments and risks were discussed with patient. - discussed diagnosis and progression - monitor  Ophthalmic Meds Ordered this visit:  Meds ordered this encounter  Medications  Bevacizumab (AVASTIN) SOLN 1.25 mg   Bevacizumab (AVASTIN) SOLN 1.25 mg     Return in about 2 weeks (around 11/02/2023) for f/u PDR OU, DFE, OCT.  There are no Patient Instructions on file for this visit.  Explained the diagnoses, plan, and follow up with the patient and they expressed understanding.  Patient expressed understanding of the importance of proper follow up care.   This document serves as a record of services personally performed by Karie Chimera, MD, PhD. It was created on their behalf by Charlette Caffey, COT an ophthalmic  technician. The creation of this record is the provider's dictation and/or activities during the visit.    Electronically signed by:  Charlette Caffey, COT  10/19/23 5:01 PM  This document serves as a record of services personally performed by Karie Chimera, MD, PhD. It was created on their behalf by Glee Arvin. Manson Passey, OA an ophthalmic technician. The creation of this record is the provider's dictation and/or activities during the visit.    Electronically signed by: Glee Arvin. Manson Passey, OA 10/19/23 5:01 PM  Karie Chimera, M.D., Ph.D. Diseases & Surgery of the Retina and Vitreous [facility] 10/19/2023  I have reviewed the above documentation for accuracy and completeness, and I agree with the above. Karie Chimera, M.D., Ph.D. 10/19/23 5:01 PM   Abbreviations: M myopia (nearsighted); A astigmatism; H hyperopia (farsighted); P presbyopia; Mrx spectacle prescription;  CTL contact lenses; OD right eye; OS left eye; OU both eyes  XT exotropia; ET esotropia; PEK punctate epithelial keratitis; PEE punctate epithelial erosions; DES dry eye syndrome; MGD meibomian gland dysfunction; ATs artificial tears; PFAT's preservative free artificial tears; NSC nuclear sclerotic cataract; PSC posterior subcapsular cataract; ERM epi-retinal membrane; PVD posterior vitreous detachment; RD retinal detachment; DM diabetes mellitus; DR diabetic retinopathy; NPDR non-proliferative diabetic retinopathy; PDR proliferative diabetic retinopathy; CSME clinically significant macular edema; DME diabetic macular edema; dbh dot blot hemorrhages; CWS cotton wool spot; POAG primary open angle glaucoma; C/D cup-to-disc ratio; HVF humphrey visual field; GVF goldmann visual field; OCT optical coherence tomography; IOP intraocular pressure; BRVO Branch retinal vein occlusion; CRVO central retinal vein occlusion; CRAO central retinal artery occlusion; BRAO branch retinal artery occlusion; RT retinal tear; SB scleral  buckle; PPV pars plana vitrectomy; VH Vitreous hemorrhage; PRP panretinal laser photocoagulation; IVK intravitreal kenalog; VMT vitreomacular traction; MH Macular hole;  NVD neovascularization of the disc; NVE neovascularization elsewhere; AREDS age related eye disease study; ARMD age related macular degeneration; POAG primary open angle glaucoma; EBMD epithelial/anterior basement membrane dystrophy; ACIOL anterior chamber intraocular lens; IOL intraocular lens; PCIOL posterior chamber intraocular lens; Phaco/IOL phacoemulsification with intraocular lens placement; PRK photorefractive keratectomy; LASIK laser assisted in situ keratomileusis; HTN hypertension; DM diabetes mellitus; COPD chronic obstructive pulmonary disease

## 2023-10-13 ENCOUNTER — Ambulatory Visit: Payer: Medicaid Other

## 2023-10-13 DIAGNOSIS — M5459 Other low back pain: Secondary | ICD-10-CM

## 2023-10-13 DIAGNOSIS — M6281 Muscle weakness (generalized): Secondary | ICD-10-CM

## 2023-10-13 NOTE — Therapy (Signed)
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 02/2021 Echo: EF 35-40%, glob HK, GrI DD, nl RV fxn, mildly dil LA, mod MR; b. 06/2022 Echo: EF 50-55%, no rwma, GrII DD, nl RV fxn, RVSP , mildly dil LA, mild MR, AoV sclerosis.   CKD (chronic kidney disease), stage III (HCC)    Complication of anesthesia 03/23/2023   Possible gastroparesis based off food in stomach during EGD   Coronary artery disease    a. 04/2021 Cath: LM nl, LAD 70p/m, 27m, 80d, LCX 30m, OM1 40, OM2 99 (fills via collats from OM1), RCA 60d, RPDA 75.   COVID-19 2021   Hyperlipidemia LDL goal <70    Hypertension    Ischemic cardiomyopathy    a. 02/2021 Echo: EF 35-40%, glob HK, GrI DD; b. 06/2022 Echo: EF 50-55%.   Moderate mitral regurgitation    a. 02/2021 Echo: Mod MR.   MRSA infection within last 3 months 02/25/2016   Osteomyelitis of foot (HCC) 08/26/2016   Type II diabetes mellitus (HCC)    Past Surgical History:  Procedure Laterality Date   CHOLECYSTECTOMY  1999   COLONOSCOPY WITH PROPOFOL N/A 03/25/2021   Procedure: COLONOSCOPY WITH PROPOFOL;  Surgeon: [name], MD;  Location: [facility] ENDOSCOPY;  Service: Gastroenterology;  Laterality: N/A;   COLONOSCOPY WITH PROPOFOL N/A 04/17/2021   Procedure: COLONOSCOPY WITH PROPOFOL;  Surgeon: [name], MD;  Location: [facility] ENDOSCOPY;  Service: Gastroenterology;  Laterality: N/A;    ESOPHAGOGASTRODUODENOSCOPY (EGD) WITH PROPOFOL N/A 03/25/2021   Procedure: ESOPHAGOGASTRODUODENOSCOPY (EGD) WITH PROPOFOL;  Surgeon: [name], MD;  Location: [facility] ENDOSCOPY;  Service: Gastroenterology;  Laterality: N/A;   ESOPHAGOGASTRODUODENOSCOPY (EGD) WITH PROPOFOL N/A 04/17/2021   Procedure: ESOPHAGOGASTRODUODENOSCOPY (EGD) WITH PROPOFOL;  Surgeon: [name], MD;  Location: [facility] ENDOSCOPY;  Service: Gastroenterology;  Laterality: N/A;   ESOPHAGOGASTRODUODENOSCOPY (EGD) WITH PROPOFOL N/A 03/23/2023   Procedure: ESOPHAGOGASTRODUODENOSCOPY (EGD) WITH PROPOFOL;  Surgeon: [name], MD;  Location: [facility] ENDOSCOPY;  Service: Gastroenterology;  Laterality: N/A;   RIGHT/LEFT HEART CATH AND CORONARY ANGIOGRAPHY Bilateral 04/14/2021   Procedure: RIGHT/LEFT HEART CATH AND CORONARY ANGIOGRAPHY;  Surgeon: Yvonne Kendall, MD;  Location: [facility] INVASIVE CV LAB;  Service: Cardiovascular;  Laterality: Bilateral;   TOE SURGERY Left 02/07/2016   Pinky Toe   Patient Active Problem List   Diagnosis Date Noted   Weakness of extremity 09/20/2023   Arthropathy of lumbar facet joint 07/29/2023   Low back pain 07/29/2023   Pain in finger of left hand 07/29/2023   Sacroiliac joint pain 07/29/2023   Contact dermatitis and eczema 06/08/2023   Dysphagia 03/23/2023   Asthma, well controlled 03/09/2023   Depression, major, in remission (HCC) 03/09/2023   Perennial allergic rhinitis with seasonal variation 03/09/2023   Type 2 diabetes mellitus with diabetic polyneuropathy, with long-term current use of  insulin (HCC) 08/31/2022   Type 2 diabetes mellitus with retinopathy, with long-term current use of insulin (HCC) 08/31/2022   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(HCC) 08/31/2022   Hypertension 06/11/2022   Ischemic cardiomyopathy 06/11/2022   Coronary artery disease 06/11/2022   Chronic systolic heart failure (HCC) 06/11/2022   Angina pectoris associated with type 2 diabetes mellitus (HCC) 01/05/2022    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 edema associated with type 2 diabetes mellitus (HCC) 05/01/2021   Cardiomyopathy (HCC) 04/14/2021   Depression, major, recurrent, mild (HCC) 09/25/2019   History of 2019 novel coronavirus disease (COVID-19) 06/23/2019   Migraine headache without aura 01/14/2018   Type II diabetes mellitus (HCC) 05/22/2017   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 associated with type 2 diabetes mellitus (HCC) 05/21/2016   Severe nonproliferative diabetic retinopathy of right eye without macular edema associated with type 2 diabetes mellitus (HCC) 05/21/2016   Hyperlipidemia LDL goal <70 03/10/2016   MI (mitral incompetence) 02/25/2016   Insomnia 02/25/2016   Anxiety 09/04/2014    PCP: [name], MD  REFERRING PROVIDER: [name], MD  REFERRING DIAG:  M54.50 (ICD-10-CM) - Intermittent low back pain    Rationale for Evaluation and Treatment: Rehabilitation  THERAPY DIAG:  Other low back pain  Muscle weakness (generalized)  ONSET DATE: June, 2024   SUBJECTIVE:                                                                                                                                                                                           SUBJECTIVE STATEMENT:  Pt reports no LBP at today's session however does report some soreness in the back and following last session.   PERTINENT HISTORY:  Pt presents to [facility] with LBP and balance deficits that started in June, 2024. During this time she was having very severe migraines w/ light sensitivity and was receiving medical treatment. Over the past several months,following a viral infection, pt has reported speech difficulties with certain words, weight loss of 10-15# and balance deficits in addition to her back pain. Also with ED/urgent care visit for nausea and recent rash on LE's and difficulty with mobilizing her LLE due to significant  weakness. Pt has seen her PCP and an orthopedic specialist regarding her LBP. Pt reports increased difficulty with ascending stairs, and getting in and out of a car or bathtub. Pt reports her pain at best is a 3/10NPS and at worse is an 8/10. Pt's LBP is worse in the morning, and gets better throughout the day. Pt's pain is relieved with heat and Tylenol.   PAIN:  Are  you having pain? Yes: NPRS scale: 0/10 Pain location: Bilat para-spinals  Pain description: Dull and achey, sharp at night into R buttocks  Aggravating factors: Going up stairs, getting in and out of a car, getting in and out of the tub  Relieving factors: Heat, Tylenol  Worst pain 8/10 LBP is worse in the morning, less throughout the day  PRECAUTIONS: Fall  RED FLAGS: None   WEIGHT BEARING RESTRICTIONS: No  FALLS:  Has patient fallen in last 6 months? No  LIVING ENVIRONMENT:  Stairs: Yes: External: 3 steps; on left going up   OCCUPATION: [employer]   PLOF: Independent  PATIENT GOALS: Be able to move better  NEXT MD VISIT: 09/20/23  OBJECTIVE:  Note: Objective measures were completed at Evaluation unless otherwise noted.  DIAGNOSTIC FINDINGS:  Pt reports X-rays taken at [facility], she states that there were no notable findings from x-rays.   PATIENT SURVEYS:  FOTO 43/60  SCREENING FOR RED FLAGS: Bowel or bladder incontinence: No   COGNITION: Overall cognitive status: Within functional limits for tasks assessed     SENSATION: WFL   PALPATION: No TTP noted.   LUMBAR ROM:   AROM eval  Flexion WFL  Extension WFL  Right lateral flexion WFL*  Left lateral flexion WFL*  Right rotation WFL  Left rotation WFL    (Blank rows = not tested)  LOWER EXTREMITY ROM: All LE ROM WFL   LOWER EXTREMITY MMT:    MMT Right eval Left eval  Hip flexion 3+ 2  Hip extension 2+ 2+  Hip abduction 3 3  Hip adduction 3 3  Hip internal rotation 3+ 3+  Hip external rotation 3 3  Knee flexion 4 4   Knee extension 4 4  Ankle dorsiflexion 4 4-  Ankle plantarflexion 4 4   (Blank rows = not tested)  LUMBAR SPECIAL TESTS:   SI Compression/distraction test: Negative, FABER test: Negative, and FADDIR: (R) negative (L) positive Scour bilat: Negative, Thigh Thrust bilat: Negative  REFLEXES:   R/ L patellar tendon: areflexive; R/L achilles tendon: areflexive  FUNCTIONAL TESTS:  5 times sit to stand: 11.7 seconds Timed up and go (TUG): 8.28 seconds Functional gait assessment: 18/30  GAIT: Comments: Increased ER of LLE with ambulation, decreased ability to ambulate in a straight line  TODAY'S TREATMENT:                                                                                                                              DATE: 10/13/23  Nustep x5 minutes lvl 4.0 Seat #11 STS from plinth 2 x5 w/ no UE support Seated hip flexion RLE/LLE x10/ each side Seated hip flexion RLE/LLE 2 x10/ each side w/ 2# AW's  Sidelying clamshells RLE x10  Sidelying clamshells w/ RTB RLE x10, LLE 2 x10 Sidelying reverse clamshells w/ RTB RLE/LLE 2 x10 Standing paloff press w/ GTB x5/ each side Seated clear physioball forward/ R lateral flexion/ L  lateral flexion x10/ each direction   PATIENT EDUCATION:  Education details: Educated pt on findings from evaluation (strength deficits, subjective)  Person educated: Patient Education method: Explanation Education comprehension: verbalized understanding  HOME EXERCISE PROGRAM: Access Code: [email or web address] URL: [email or web address] Date: 10/13/2023 Prepared by: Ronnie Derby  Exercises - Sit to Stand with Arms Crossed  - 1 x daily - 4-5 x weekly - 3 sets - 6-8 reps - Seated March  - 1 x daily - 4-5 x weekly - 3 sets - 6-8 reps - Clamshell  - 1 x daily - 4-5 x weekly - 3 sets - 10 reps - Sidelying Reverse Clamshell  - 1 x daily - 4-5 x weekly - 3 sets - 10 reps  ASSESSMENT:  CLINICAL IMPRESSION: Session focused on progressing  proximal hip and lumbar/ core strengthening. Pt notes improvements in proximal hip strength noted w/ improved activity tolerance to exercise progressions (see above). Pt continues to note deficits in lumbar/ core strength noting increased muscle fatigue following core strengthening exercise. However, pt's lumbar mobility displays improvements demonstrated with relief of lumbar soreness sx following multi-directional clear physioball rollouts. Future sessions will incorporate increased balance exercises for this pt. Pt would continue to benefit from skilled PT interventions to address bilat LE strength and balance deficits.  OBJECTIVE IMPAIRMENTS: decreased mobility, difficulty walking, decreased strength, and pain.   ACTIVITY LIMITATIONS: carrying, lifting, squatting, stairs, and transfers  PARTICIPATION LIMITATIONS: driving, community activity, and occupation  PERSONAL FACTORS: Past/current experiences and Time since onset of injury/illness/exacerbation are also affecting patient's functional outcome.   REHAB POTENTIAL: Good  CLINICAL DECISION MAKING: Evolving/moderate complexity  EVALUATION COMPLEXITY: Moderate   GOALS: Goals reviewed with patient? Yes  SHORT TERM GOALS: Target date: 10/14/23  Pt will be independent with HEP to improve bilat LE strength and reduce low back pain with functional activities  Baseline: 09/16/23: Deferred to next session Goal status: INITIAL   LONG TERM GOALS: Target date: 11/11/23  Pt will improve FOTO to target score to demonstrate clinically significant improvement in functional mobility.   Baseline: 09/16/23: 43/60 Goal status: INITIAL  2.  Pt to improve 5x STS to < 9.7 seconds to display improved functional mobility and LE strength to meet age matched norms.  Baseline: 09/16/23: Deferred to next session 10/11/23: 11.7 seconds Goal status: INITIAL  3.  Pt to improve TUG time by at least 3.4 seconds for clinically significant improvement for  decreased risk of falls.  Baseline: 09/16/23: Deferred to next session 10/11/23: 8.28 seconds Goal status: INITIAL  4.   Pt will improve FGA score by 4 points to display clinically significant improvement in balance.  Baseline: 09/16/23: Deferred to next session 10/11/23: 18/30 Goal status: INITIAL  5.  Pt will improve all bilat LE AROM to </= 4/5 MMT to display functional improvements in bilat LE strength.  Baseline: 09/16/23-  MMT Right eval Left eval  Hip flexion 3+ 2  Hip extension 2+ 2+  Hip abduction 3 3  Hip adduction 3 3  Hip internal rotation 3+ 3+  Hip external rotation 3 3  Knee flexion 4 4  Knee extension 4 4  Ankle dorsiflexion 4 4-  Ankle plantarflexion 4 4   Goal status: INITIAL   PLAN:  PT FREQUENCY: 1x/week  PT DURATION: 8 weeks  PLANNED INTERVENTIONS: Therapeutic exercises, Therapeutic activity, Neuromuscular re-education, Balance training, Gait training, Patient/Family education, Self Care, Joint mobilization, Stair training, Electrical stimulation, Spinal manipulation, Spinal mobilization, Cryotherapy, Moist heat, and Re-evaluation.  PLAN FOR NEXT SESSION:  Balance exercises, progress bilat LE strengthening exercises.   Lovie Macadamia, SPT   Delphia Grates. Fairly IV, PT, DPT Physical Therapist- [place]  [facility] [place]  10/13/2023, 2:53 PM

## 2023-10-15 ENCOUNTER — Encounter: Payer: Medicaid Other | Attending: Internal Medicine | Admitting: Dietician

## 2023-10-15 ENCOUNTER — Encounter: Payer: Self-pay | Admitting: Dietician

## 2023-10-15 VITALS — Ht 69.0 in | Wt 175.0 lb

## 2023-10-15 DIAGNOSIS — E1142 Type 2 diabetes mellitus with diabetic polyneuropathy: Secondary | ICD-10-CM

## 2023-10-15 DIAGNOSIS — E1165 Type 2 diabetes mellitus with hyperglycemia: Secondary | ICD-10-CM | POA: Insufficient documentation

## 2023-10-15 DIAGNOSIS — Z7984 Long term (current) use of oral hypoglycemic drugs: Secondary | ICD-10-CM | POA: Diagnosis not present

## 2023-10-15 DIAGNOSIS — Z794 Long term (current) use of insulin: Secondary | ICD-10-CM | POA: Insufficient documentation

## 2023-10-15 DIAGNOSIS — Z713 Dietary counseling and surveillance: Secondary | ICD-10-CM | POA: Diagnosis not present

## 2023-10-15 NOTE — Patient Instructions (Signed)
Stay active Continue to use your Dexcom Balanced meals- consider simple meal prep  Eat more Non-Starchy Vegetables These include greens, broccoli, cauliflower, cabbage, carrots, beets, eggplant, peppers, squash and others. Minimize added sugars and refined grains Rethink what you drink.  Choose beverages without added sugar.  Look for 0 carbs on the label. See the list of whole grains below.  Find alternatives to usual sweet treats. Choose whole foods over processed. Make simple meals at home more often than eating out.  Tips to increase fiber in your diet: (All plants have fiber.  Eat a variety. There are more than are on this list.) Slowly increase the amount of fiber you eat to 25-35 grams per day.  (More is fine if you tolerate it.) Fiber from whole grains, nuts and seeds Quinoa, 1/2 cup = 5 grams Bulgur, 1/2 cup = 4.1 grams Popcorn, 3 cups = 3.6 grams Whole Wheat Spaghetti, 1/2 cup = 3.2 grams Barley, 1/2 cup = 3 grams Oatmeal, 1/2 cup = 2 grams Whole Wheat English Muffin = 3 grams Corn, 1/2 cup = 2.1 grams Brown Rice, 1/2 cup = 1.8 grams Flax seeds, 1 Tablespoon = 2.8 grams Chia seeds, 1 Tablespoon = 11 grams Almonds, 1 ounce = 3.5 grams fiber Fiber from legumes Kidney beans, 1/2 cup 7.9 grams Lentils, 1/2 cup = 7.8 grams Pinto beans, 1/2 cup = 7.7 grams Black beans, 1/2 cup = 7.6 grams Lima beans, 1/2 cup 6.4 grams Chick peas, 1/2 cup = 5.3 grams Black eyed peas, 1/2 cup = 4 grams Fiber from fruits and vegetables Pear, 6 grams Apple. 3.3 grams Raspberries or Blackberries, 3/4 cup = 6 grams Strawberries or Blueberries, 1 cup = 3.4 grams Baked sweet potato 3.8 grams fiber Baked potato with skin 4.4 grams  Peas, 1/2 cup = 4.4 grams  Spinach, 1/2 cup cooked = 3.5 grams  Avocado, 1/2 = 5 grams

## 2023-10-15 NOTE — Progress Notes (Signed)
Diabetes Self-Management Education  Visit Type: First/Initial  Appt. Start Time: 1430 Appt. End Time: 1530  10/22/2023  [name], identified by name and date of birth, is a 52 y.o. female with a diagnosis of Diabetes: Type 2.   ASSESSMENT Patient is here today alone.  She is doing well but hurt her back last week.  She has been going to PT for the pain.  She is to have CABG sometime after getting dental work done and seeing her endocrinologist. Difficult to take meal time insulin consistently due to work schedule.  Discussed diet quality  Referral:  Type 2 Diabetes  History includes:  Type 2 Diabetes, CAD, HLD, HTN, CKD Labs:  8.4% 06/21/2023, 8.6% 03/04/2023, eGFR 57 07/23/2023 Medications include:  Jardiance, Ozempic 0.5, 70 units Basaglar q HS, Novolog 10-20 units before each meal (correction factor:  BG 120/30), spironolactone CGM:  Dexcom G7 (not currently using as she had 2 faulty ones) Currently using a blood glucose meter.  Fasting blood glucose was 160.  251 after a cheese danish today.  Weight hx: 69" 175 lbs 10/15/2023 No recent weight  changes  Patient lives alone.  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  She eats at [employer] or snacks between jobs.  Height 5\' 9"  (1.753 m), weight 175 lb (79.4 kg). Body mass index is 25.84 kg/m.   Diabetes Self-Management Education - 10/15/23 1446       Visit Information   Visit Type First/Initial      Initial Visit   Diabetes Type Type 2    Date Diagnosed --   unknown   Are you currently following a meal plan? No    Are you taking your medications as prescribed? Yes      Health Coping   How would you rate your overall health? Good      Psychosocial Assessment   Patient Belief/Attitude about Diabetes Motivated to manage diabetes    What is the hardest part about your diabetes right now, causing you the most concern, or is the most worrisome to you about your diabetes?   Making healty  food and beverage choices    Self-care barriers None    Self-management support Doctor's office    Other persons present Patient    Patient Concerns Nutrition/Meal planning    Special Needs None    Preferred Learning Style No preference indicated    Learning Readiness Ready    How often do you need to have someone help you when you read instructions, pamphlets, or other written materials from your doctor or pharmacy? 1 - Never    What is the last grade level you completed in school? some college      Pre-Education Assessment   Patient understands the diabetes disease and treatment process. Needs Review    Patient understands incorporating nutritional management into lifestyle. Needs Review    Patient undertands incorporating physical activity into lifestyle. Needs Review    Patient understands using medications safely. Needs Review    Patient understands monitoring blood glucose, interpreting and using results Needs Review    Patient understands prevention, detection, and treatment of acute complications. Needs Review    Patient understands prevention, detection, and treatment of chronic complications. Needs Review    Patient understands how to develop strategies to address psychosocial issues. Needs Review    Patient understands how to develop strategies to promote health/change behavior. Needs Review      Complications   Last  HgB A1C per patient/outside source 8.4 %   06/21/2023 and 8.6% 03/04/2023   How often do you check your blood sugar? 3-4 times/day    Fasting Blood glucose range (mg/dL) [phone]    Number of hypoglycemic episodes per month 2    Can you tell when your blood sugar is low? Yes    What do you do if your blood sugar is low? OJ    Number of hyperglycemic episodes ( >200mg /dL): Daily    Can you tell when your blood sugar is high? Yes    What do you do if your blood sugar is high? sometimes    Have you had a dilated eye exam in the past 12 months? Yes    Have you had a  dental exam in the past 12 months? Yes    Are you checking your feet? Yes    How many days per week are you checking your feet? 7      Dietary Intake   Breakfast cheese danish, coffee with splenda OR [place] sausage, grits, toast    Snack (morning) none    Lunch none    Snack (afternoon) PB crackers    Dinner Oodles of Noodles OR spaghetti with marinara    Snack (evening) occasional peanuts or PB crackers    Beverage(s) water, Pepsi Zero, coffee with splenda, whole milk      Activity / Exercise   Activity / Exercise Type Light (walking / raking leaves)   currently in PT   How many days per week do you exercise? 2    How many minutes per day do you exercise? 30    Total minutes per week of exercise 60      Patient Education   Previous Diabetes Education Yes (please comment)   when diagnosed at [facility]   Disease Pathophysiology Definition of diabetes, type 1 and 2, and the diagnosis of diabetes    Healthy Eating Role of diet in the treatment of diabetes and the relationship between the three main macronutrients and blood glucose level;Food label reading, portion sizes and measuring food.;Plate Method;Meal options for control of blood glucose level and chronic complications.    Being Active Role of exercise on diabetes management, blood pressure control and cardiac health.;Helped patient identify appropriate exercises in relation to his/her diabetes, diabetes complications and other health issue.    Medications Reviewed patients medication for diabetes, action, purpose, timing of dose and side effects.    Monitoring Identified appropriate SMBG and/or A1C goals.;Taught/discussed recording of test results and interpretation of SMBG.;Daily foot exams;Yearly dilated eye exam    Acute complications Taught prevention, symptoms, and  treatment of hypoglycemia - the 15 rule.;Discussed and identified patients' prevention, symptoms, and treatment of hyperglycemia.    Chronic complications Relationship  between chronic complications and blood glucose control;Lipid levels, blood glucose control and heart disease;Identified and discussed with patient  current chronic complications    Diabetes Stress and Support Identified and addressed patients feelings and concerns about diabetes;Worked with patient to identify barriers to care and solutions;Role of stress on diabetes      Individualized Goals (developed by patient)   Nutrition General guidelines for healthy choices and portions discussed    Physical Activity Exercise 5-7 days per week;30 minutes per day   as allowed by MD   Medications take my medication as prescribed    Monitoring  Consistenly use CGM    Problem Solving Eating Pattern;Addressing barriers to behavior change    Reducing Risk  examine blood glucose patterns;do foot checks daily;treat hypoglycemia with 15 grams of carbs if blood glucose less than 70mg /dL    Health Coping Ask for help with psychological, social, or emotional issues;Discuss barriers to diabetes care with support person/system (comment specifics as needed)      Post-Education Assessment   Patient understands the diabetes disease and treatment process. Comprehends key points    Patient understands incorporating nutritional management into lifestyle. Comprehends key points    Patient undertands incorporating physical activity into lifestyle. Comprehends key points    Patient understands using medications safely. Comphrehends key points    Patient understands monitoring blood glucose, interpreting and using results Comprehends key points    Patient understands prevention, detection, and treatment of acute complications. Comprehends key points    Patient understands prevention, detection, and treatment of chronic complications. Comprehends key points    Patient understands how to develop strategies to address psychosocial issues. Comprehends key points    Patient understands how to develop strategies to promote  health/change behavior. Needs Review      Outcomes   Expected Outcomes Demonstrated interest in learning but significant barriers to change    Future DMSE 3-4 months    Program Status Not Completed             Individualized Plan for Diabetes Self-Management Training:   Learning Objective:  Patient will have a greater understanding of diabetes self-management. Patient education plan is to attend individual and/or group sessions per assessed needs and concerns.   Plan:   Patient Instructions  Stay active Continue to use your Dexcom Balanced meals- consider simple meal prep  Eat more Non-Starchy Vegetables These include greens, broccoli, cauliflower, cabbage, carrots, beets, eggplant, peppers, squash and others. Minimize added sugars and refined grains Rethink what you drink.  Choose beverages without added sugar.  Look for 0 carbs on the label. See the list of whole grains below.  Find alternatives to usual sweet treats. Choose whole foods over processed. Make simple meals at home more often than eating out.  Tips to increase fiber in your diet: (All plants have fiber.  Eat a variety. There are more than are on this list.) Slowly increase the amount of fiber you eat to 25-35 grams per day.  (More is fine if you tolerate it.) Fiber from whole grains, nuts and seeds Quinoa, 1/2 cup = 5 grams Bulgur, 1/2 cup = 4.1 grams Popcorn, 3 cups = 3.6 grams Whole Wheat Spaghetti, 1/2 cup = 3.2 grams Barley, 1/2 cup = 3 grams Oatmeal, 1/2 cup = 2 grams Whole Wheat English Muffin = 3 grams Corn, 1/2 cup = 2.1 grams Brown Rice, 1/2 cup = 1.8 grams Flax seeds, 1 Tablespoon = 2.8 grams Chia seeds, 1 Tablespoon = 11 grams Almonds, 1 ounce = 3.5 grams fiber Fiber from legumes Kidney beans, 1/2 cup 7.9 grams Lentils, 1/2 cup = 7.8 grams Pinto beans, 1/2 cup = 7.7 grams Black beans, 1/2 cup = 7.6 grams Lima beans, 1/2 cup 6.4 grams Chick peas, 1/2 cup = 5.3 grams Black eyed peas, 1/2  cup = 4 grams Fiber from fruits and vegetables Pear, 6 grams Apple. 3.3 grams Raspberries or Blackberries, 3/4 cup = 6 grams Strawberries or Blueberries, 1 cup = 3.4 grams Baked sweet potato 3.8 grams fiber Baked potato with skin 4.4 grams  Peas, 1/2 cup = 4.4 grams  Spinach, 1/2 cup cooked = 3.5 grams  Avocado, 1/2 = 5 grams  Expected Outcomes:  Demonstrated interest in learning but significant barriers to change  Education material provided: ADA - How to Thrive: A Guide for Your Journey with Diabetes, Meal plan card, Snack sheet, and Diabetes Resources  If problems or questions, patient to contact team via:  Phone  Future DSME appointment: 3-4 months

## 2023-10-18 ENCOUNTER — Ambulatory Visit: Payer: Medicaid Other | Attending: Family Medicine

## 2023-10-18 DIAGNOSIS — M6281 Muscle weakness (generalized): Secondary | ICD-10-CM | POA: Diagnosis present

## 2023-10-18 DIAGNOSIS — M5459 Other low back pain: Secondary | ICD-10-CM | POA: Insufficient documentation

## 2023-10-18 NOTE — Therapy (Unsigned)
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 02/2021 Echo: EF 35-40%, glob HK, GrI DD, nl RV fxn, mildly dil LA, mod MR; b. 06/2022 Echo: EF 50-55%, no rwma, GrII DD, nl RV fxn, RVSP , mildly dil LA, mild MR, AoV sclerosis.   CKD (chronic kidney disease), stage III (HCC)    Complication of anesthesia 03/23/2023   Possible gastroparesis based off food in stomach during EGD   Coronary artery disease    a. 04/2021 Cath: LM nl, LAD 70p/m, 3m, 80d, LCX 69m, OM1 40, OM2 99 (fills via collats from OM1), RCA 60d, RPDA 75.   COVID-19 2021   Hyperlipidemia LDL goal <70    Hypertension    Ischemic cardiomyopathy    a. 02/2021 Echo: EF 35-40%, glob HK, GrI DD; b. 06/2022 Echo: EF 50-55%.   Moderate mitral regurgitation    a. 02/2021 Echo: Mod MR.   MRSA infection within last 3 months 02/25/2016   Osteomyelitis of foot (HCC) 08/26/2016   Type II diabetes mellitus (HCC)    Past Surgical History:  Procedure Laterality Date   CHOLECYSTECTOMY  1999   COLONOSCOPY WITH PROPOFOL N/A 03/25/2021   Procedure: COLONOSCOPY WITH PROPOFOL;  Surgeon: [name], MD;  Location: [facility] ENDOSCOPY;  Service: Gastroenterology;  Laterality: N/A;   COLONOSCOPY WITH PROPOFOL N/A 04/17/2021   Procedure: COLONOSCOPY WITH PROPOFOL;  Surgeon: [name], MD;  Location: [facility] ENDOSCOPY;  Service: Gastroenterology;  Laterality: N/A;    ESOPHAGOGASTRODUODENOSCOPY (EGD) WITH PROPOFOL N/A 03/25/2021   Procedure: ESOPHAGOGASTRODUODENOSCOPY (EGD) WITH PROPOFOL;  Surgeon: [name], MD;  Location: [facility] ENDOSCOPY;  Service: Gastroenterology;  Laterality: N/A;   ESOPHAGOGASTRODUODENOSCOPY (EGD) WITH PROPOFOL N/A 04/17/2021   Procedure: ESOPHAGOGASTRODUODENOSCOPY (EGD) WITH PROPOFOL;  Surgeon: [name], MD;  Location: [facility] ENDOSCOPY;  Service: Gastroenterology;  Laterality: N/A;   ESOPHAGOGASTRODUODENOSCOPY (EGD) WITH PROPOFOL N/A 03/23/2023   Procedure: ESOPHAGOGASTRODUODENOSCOPY (EGD) WITH PROPOFOL;  Surgeon: [name], MD;  Location: [facility] ENDOSCOPY;  Service: Gastroenterology;  Laterality: N/A;   RIGHT/LEFT HEART CATH AND CORONARY ANGIOGRAPHY Bilateral 04/14/2021   Procedure: RIGHT/LEFT HEART CATH AND CORONARY ANGIOGRAPHY;  Surgeon: Yvonne Kendall, MD;  Location: [facility] INVASIVE CV LAB;  Service: Cardiovascular;  Laterality: Bilateral;   TOE SURGERY Left 02/07/2016   Pinky Toe   Patient Active Problem List   Diagnosis Date Noted   Weakness of extremity 09/20/2023   Arthropathy of lumbar facet joint 07/29/2023   Low back pain 07/29/2023   Pain in finger of left hand 07/29/2023   Sacroiliac joint pain 07/29/2023   Contact dermatitis and eczema 06/08/2023   Dysphagia 03/23/2023   Asthma, well controlled 03/09/2023   Depression, major, in remission (HCC) 03/09/2023   Perennial allergic rhinitis with seasonal variation 03/09/2023   Type 2 diabetes mellitus with diabetic polyneuropathy, with long-term current use of  insulin (HCC) 08/31/2022   Type 2 diabetes mellitus with retinopathy, with long-term current use of insulin (HCC) 08/31/2022   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(HCC) 08/31/2022   Hypertension 06/11/2022   Ischemic cardiomyopathy 06/11/2022   Coronary artery disease 06/11/2022   Chronic systolic heart failure (HCC) 06/11/2022   Angina pectoris associated with type 2 diabetes mellitus (HCC) 01/05/2022    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 edema associated with type 2 diabetes mellitus (HCC) 05/01/2021   Cardiomyopathy (HCC) 04/14/2021   Depression, major, recurrent, mild (HCC) 09/25/2019   History of 2019 novel coronavirus disease (COVID-19) 06/23/2019   Migraine headache without aura 01/14/2018   Type II diabetes mellitus (HCC) 05/22/2017   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 associated with type 2 diabetes mellitus (HCC) 05/21/2016   Severe nonproliferative diabetic retinopathy of right eye without macular edema associated with type 2 diabetes mellitus (HCC) 05/21/2016   Hyperlipidemia LDL goal <70 03/10/2016   MI (mitral incompetence) 02/25/2016   Insomnia 02/25/2016   Anxiety 09/04/2014    PCP: [name], MD  REFERRING PROVIDER: [name], MD  REFERRING DIAG:  M54.50 (ICD-10-CM) - Intermittent low back pain    Rationale for Evaluation and Treatment: Rehabilitation  THERAPY DIAG:  Other low back pain  Muscle weakness (generalized)  ONSET DATE: June, 2024   SUBJECTIVE:                                                                                                                                                                                           SUBJECTIVE STATEMENT:  Pt reports 7.5/10 NPS LBP at today's session. She does not recall anything that flared up the pain over the weekend.   PERTINENT HISTORY:  Pt presents to OPPT with LBP and balance deficits that started in June, 2024. During this time she was having very severe migraines w/ light sensitivity and was receiving medical treatment. Over the past several months,following a viral infection, pt has reported speech difficulties with certain words, weight loss of 10-15# and balance deficits in addition to her back pain. Also with ED/urgent care visit for nausea and recent rash on LE's and difficulty with mobilizing her LLE due to significant  weakness. Pt has seen her PCP and an orthopedic specialist regarding her LBP. Pt reports increased difficulty with ascending stairs, and getting in and out of a car or bathtub. Pt reports her pain at best is a 3/10NPS and at worse is an 8/10. Pt's LBP is worse in the morning, and gets better throughout the day. Pt's pain is relieved with heat and Tylenol.   PAIN:  Are you having pain? Yes: NPRS scale: 7.5/10 Pain location: Bilat para-spinals  Pain description: Dull and achey, sharp at night into R buttocks  Aggravating factors: Going up stairs, getting in and out of a car, getting in and out of the tub  Relieving factors: Heat, Tylenol  Worst pain 8/10 LBP is worse in the morning, less throughout the day  PRECAUTIONS: Fall  RED FLAGS: None   WEIGHT BEARING RESTRICTIONS: No  FALLS:  Has patient fallen in last 6 months? No  LIVING ENVIRONMENT:  Stairs: Yes: External: 3 steps; on left going up   OCCUPATION: [employer]   PLOF: Independent  PATIENT GOALS: Be able to move better  NEXT MD VISIT: 09/20/23  OBJECTIVE:  Note: Objective measures were completed at Evaluation unless otherwise noted.  DIAGNOSTIC FINDINGS:  Pt reports X-rays taken at [facility], she states that there were no notable findings from x-rays.   PATIENT SURVEYS:  FOTO 43/60  SCREENING FOR RED FLAGS: Bowel or bladder incontinence: No   COGNITION: Overall cognitive status: Within functional limits for tasks assessed     SENSATION: WFL   PALPATION: No TTP noted.   LUMBAR ROM:   AROM eval  Flexion WFL  Extension WFL  Right lateral flexion WFL*  Left lateral flexion WFL*  Right rotation WFL  Left rotation WFL    (Blank rows = not tested)  LOWER EXTREMITY ROM: All LE ROM WFL   LOWER EXTREMITY MMT:    MMT Right eval Left eval  Hip flexion 3+ 2  Hip extension 2+ 2+  Hip abduction 3 3  Hip adduction 3 3  Hip internal rotation 3+ 3+  Hip external rotation 3 3  Knee flexion 4  4  Knee extension 4 4  Ankle dorsiflexion 4 4-  Ankle plantarflexion 4 4   (Blank rows = not tested)  LUMBAR SPECIAL TESTS:   SI Compression/distraction test: Negative, FABER test: Negative, and FADDIR: (R) negative (L) positive Scour bilat: Negative, Thigh Thrust bilat: Negative  REFLEXES:   R/ L patellar tendon: areflexive; R/L achilles tendon: areflexive  FUNCTIONAL TESTS:  5 times sit to stand: 11.7 seconds Timed up and go (TUG): 8.28 seconds Functional gait assessment: 18/30  GAIT: Comments: Increased ER of LLE with ambulation, decreased ability to ambulate in a straight line  TODAY'S TREATMENT:                                                                                                                              DATE: 10/19/23  Nustep x5 minutes lvl 4.0 Seat #11 With heat pack under lumbar spine:  Supine LTR's x15/ each side  Supine single knee to chest RLE/LLE 2 x10 Supine abduction w/ GTB around distal thighs 2 x12  STS from plinth 2 x5 w/ no UE support Standing hip flexion RLE/LLE x10/ each side w 1 UE support Standing RDL's onto 11.5" step w/ 10# KB 2 x10 Alternating forward step ups  over 6" obstacle RLE/LLE 5 x15' down and back; VC's to emphasize appropriate step length of LLE over obstacle Lateral step over 6" obstacle RLE/LLE 2 x15' down and back. VC's to emphasize appropriate step width of LLE over obstacle   PATIENT EDUCATION:  Education details: Educated pt on findings from evaluation (strength deficits, subjective)  Person educated: Patient Education method: Explanation Education comprehension: verbalized understanding  HOME EXERCISE PROGRAM: Access Code: [email or web address] URL: [email or web address] Date: 10/13/2023 Prepared by: Ronnie Derby  Exercises - Sit to Stand with Arms Crossed  - 1 x daily - 4-5 x weekly - 3 sets - 6-8 reps - Seated March  - 1 x daily - 4-5 x weekly - 3 sets - 6-8 reps - Clamshell  - 1 x daily - 4-5 x weekly - 3  sets - 10 reps - Sidelying Reverse Clamshell  - 1 x daily - 4-5 x weekly - 3 sets - 10 reps  ASSESSMENT:  CLINICAL IMPRESSION: Session focused on bilat LE strengthening and obstacle negotiation exercises. Pt demonstrates progression in bilateral LE strength, as evidenced by improved STS ability from a lowered plinth table. However, the pt continues to note difficulty negotiating the LLE over a 6" obstacle noting decreased step length and step width, indicating a need for continued focus on balance and coordination training. Overall, the pt displays positive improvements in LLE function and would continue to benefit from skilled PT interventions to address remaining bilat LE strength and balance deficits.   OBJECTIVE IMPAIRMENTS: decreased mobility, difficulty walking, decreased strength, and pain.   ACTIVITY LIMITATIONS: carrying, lifting, squatting, stairs, and transfers  PARTICIPATION LIMITATIONS: driving, community activity, and occupation  PERSONAL FACTORS: Past/current experiences and Time since onset of injury/illness/exacerbation are also affecting patient's functional outcome.   REHAB POTENTIAL: Good  CLINICAL DECISION MAKING: Evolving/moderate complexity  EVALUATION COMPLEXITY: Moderate   GOALS: Goals reviewed with patient? Yes  SHORT TERM GOALS: Target date: 10/14/23  Pt will be independent with HEP to improve bilat LE strength and reduce low back pain with functional activities  Baseline: 09/16/23: Deferred to next session Goal status: INITIAL   LONG TERM GOALS: Target date: 11/11/23  Pt will improve FOTO to target score to demonstrate clinically significant improvement in functional mobility.   Baseline: 09/16/23: 43/60 Goal status: INITIAL  2.  Pt to improve 5x STS to < 9.7 seconds to display improved functional mobility and LE strength to meet age matched norms.  Baseline: 09/16/23: Deferred to next session 10/11/23: 11.7 seconds Goal status: INITIAL  3.  Pt to  improve TUG time by at least 3.4 seconds for clinically significant improvement for decreased risk of falls.  Baseline: 09/16/23: Deferred to next session 10/11/23: 8.28 seconds Goal status: INITIAL  4.   Pt will improve FGA score by 4 points to display clinically significant improvement in balance.  Baseline: 09/16/23: Deferred to next session 10/11/23: 18/30 Goal status: INITIAL  5.  Pt will improve all bilat LE AROM to </= 4/5 MMT to display functional improvements in bilat LE strength.  Baseline: 09/16/23-  MMT Right eval Left eval  Hip flexion 3+ 2  Hip extension 2+ 2+  Hip abduction 3 3  Hip adduction 3 3  Hip internal rotation 3+ 3+  Hip external rotation 3 3  Knee flexion 4 4  Knee extension 4 4  Ankle dorsiflexion 4 4-  Ankle plantarflexion 4 4   Goal status: INITIAL   PLAN:  PT FREQUENCY: 1x/week  PT DURATION: 8 weeks  PLANNED INTERVENTIONS:  Therapeutic exercises, Therapeutic activity, Neuromuscular re-education, Balance training, Gait training, Patient/Family education, Self Care, Joint mobilization, Stair training, Electrical stimulation, Spinal manipulation, Spinal mobilization, Cryotherapy, Moist heat, and Re-evaluation.  PLAN FOR NEXT SESSION: Tandem stance exercises, obstacle negotiation, progress bilat LE strengthening exercises.   Lovie Macadamia, SPT   Delphia Grates. Fairly IV, PT, DPT Physical Therapist- [place]  [facility]  10/19/2023, 8:09 AM

## 2023-10-19 ENCOUNTER — Encounter (INDEPENDENT_AMBULATORY_CARE_PROVIDER_SITE_OTHER): Payer: Self-pay | Admitting: Ophthalmology

## 2023-10-19 ENCOUNTER — Ambulatory Visit (INDEPENDENT_AMBULATORY_CARE_PROVIDER_SITE_OTHER): Payer: Medicaid Other | Admitting: Ophthalmology

## 2023-10-19 DIAGNOSIS — H4311 Vitreous hemorrhage, right eye: Secondary | ICD-10-CM | POA: Diagnosis not present

## 2023-10-19 DIAGNOSIS — H35033 Hypertensive retinopathy, bilateral: Secondary | ICD-10-CM

## 2023-10-19 DIAGNOSIS — I10 Essential (primary) hypertension: Secondary | ICD-10-CM

## 2023-10-19 DIAGNOSIS — H25813 Combined forms of age-related cataract, bilateral: Secondary | ICD-10-CM

## 2023-10-19 DIAGNOSIS — H3581 Retinal edema: Secondary | ICD-10-CM

## 2023-10-19 DIAGNOSIS — Z794 Long term (current) use of insulin: Secondary | ICD-10-CM

## 2023-10-19 DIAGNOSIS — Z7984 Long term (current) use of oral hypoglycemic drugs: Secondary | ICD-10-CM | POA: Diagnosis not present

## 2023-10-19 DIAGNOSIS — E113513 Type 2 diabetes mellitus with proliferative diabetic retinopathy with macular edema, bilateral: Secondary | ICD-10-CM | POA: Diagnosis not present

## 2023-10-19 MED ORDER — BEVACIZUMAB CHEMO INJECTION 1.25MG/0.05ML SYRINGE FOR KALEIDOSCOPE
1.2500 mg | INTRAVITREAL | Status: AC | PRN
  Administered 2023-10-19: 1.25 mg via INTRAVITREAL

## 2023-10-21 NOTE — Progress Notes (Signed)
[facility] - Clinic Note  11/02/2023   CHIEF COMPLAINT Patient presents for Retina Follow Up  HISTORY OF PRESENT ILLNESS: Sarah Duffy is a 52 y.o. female who presents to the clinic today for:  HPI     Retina Follow Up   Patient presents with  Diabetic Retinopathy.  In both eyes.  This started 2 weeks ago.  Duration of 2 weeks.  Since onset it is stable.  I, the attending physician,  performed the HPI with the patient and updated documentation appropriately.        Comments   2 week retina follow up PDR OU and PRP? Pt is reporting no vision changes noticed she denies any flashes or floaters       Last edited by Rennis Chris, MD on 11/02/2023  3:40 PM.        Referring physician: [name], MD [address] [place],  [place] [address]  HISTORICAL INFORMATION:  Selected notes from the MEDICAL RECORD NUMBER Referred by Jacquelin Hawking, PA-C for diabetic eye exam LEE:  Ocular Hx- PDR OU, previously managed by [facility] and [facility]-   CURRENT MEDICATIONS: Current Outpatient Medications (Ophthalmic Drugs)  Medication Sig   prednisoLONE acetate (PRED FORTE) 1 % ophthalmic suspension Place 1 drop into the left eye 4 (four) times daily.   No current facility-administered medications for this visit. (Ophthalmic Drugs)   Current Outpatient Medications (Other)  Medication Sig   albuterol (VENTOLIN HFA) 108 (90 Base) MCG/ACT inhaler INHALE 2 PUFFS INTO THE LUNGS EVERY 4 HOURS AS NEEDED FOR WHEEZING OR SHORTNESS OF BREATH.   amLODipine (NORVASC) 5 MG tablet Take 1 tablet (5 mg total) by mouth daily.   atorvastatin (LIPITOR) 80 MG tablet Take 1 tablet (80 mg total) by mouth daily.   Blood Glucose Monitoring Suppl (ACCU-CHEK GUIDE) w/Device KIT Use to check blood sugar 2 time daily as needed   carvedilol (COREG) 12.5 MG tablet Take 1 tablet (12.5 mg total) by mouth 2 (two) times daily.   Continuous Glucose Sensor (DEXCOM G7 SENSOR) MISC 1  DEVICE BY DOES NOT APPLY ROUTE AS DIRECTED.   cyclobenzaprine (FLEXERIL) 5 MG tablet Take 1 tablet (5 mg total) by mouth 3 (three) times daily as needed for muscle spasms. 1 tablet every 8 hours as needed for muscle spasms   ENTRESTO 24-26 MG Take 1 tablet by mouth 2 (two) times daily.   ezetimibe (ZETIA) 10 MG tablet Take 1 tablet (10 mg total) by mouth daily.   glucose blood (ACCU-CHEK GUIDE) test strip Check blood sugar 2 times daily as needed   insulin aspart (NOVOLOG FLEXPEN) 100 UNIT/ML FlexPen Max daily 30 units   Insulin Glargine (BASAGLAR KWIKPEN) 100 UNIT/ML Inject 70 Units into the skin daily.   Insulin Pen Needle 32G X 4 MM MISC 1 Device by Does not apply route in the morning, at noon, in the evening, and at bedtime.   JARDIANCE 25 MG TABS tablet Take 1 tablet (25 mg total) by mouth daily.   lactulose (CHRONULAC) 10 GM/15ML solution Take 30 mLs (20 g total) by mouth daily as needed for mild constipation.   Lancets (ACCU-CHEK MULTICLIX) lancets Check sugar 2 times daily as needed.   levocetirizine (XYZAL) 5 MG tablet Take 1 tablet (5 mg total) by mouth daily at 2 PM.   naproxen (NAPROSYN) 500 MG tablet TAKE 1 TABLET BY MOUTH 2 TIMES DAILY WITH A MEAL.   nitroGLYCERIN (NITROSTAT) 0.4 MG SL tablet PLACE  1 TABLET UNDER THE TONGUE EVERY 5 MINUTES AS NEEDED FOR CHEST PAIN.   Rimegepant Sulfate (NURTEC) 75 MG TBDP Take 1 tablet (75 mg total) by mouth daily as needed. Max of every other day prn   spironolactone (ALDACTONE) 25 MG tablet Take 1 tablet (25 mg total) by mouth daily.   triamcinolone cream (KENALOG) 0.1 % Apply 1 Application topically 2 (two) times daily.   No current facility-administered medications for this visit. (Other)   REVIEW OF SYSTEMS: ROS   Positive for: Endocrine, Cardiovascular, Eyes, Respiratory, Psychiatric Last edited by Etheleen Mayhew, COT on 11/02/2023  2:25 PM.      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 02/2021 Echo: EF 35-40%, glob HK, GrI DD, nl RV fxn, mildly dil LA, mod MR; b. 06/2022 Echo: EF 50-55%, no rwma, GrII DD, nl RV fxn, RVSP , mildly dil LA, mild MR, AoV sclerosis.   CKD (chronic kidney disease), stage III (HCC)    Complication of anesthesia 03/23/2023   Possible gastroparesis based off food in stomach during EGD   Coronary artery disease    a. 04/2021 Cath: LM nl, LAD 70p/m, 20m, 80d, LCX 2m, OM1 40, OM2 99 (fills via collats from OM1), RCA 60d, RPDA 75.   COVID-19 2021   Hyperlipidemia LDL goal <70    Hypertension    Ischemic cardiomyopathy    a. 02/2021 Echo: EF 35-40%, glob HK, GrI DD; b. 06/2022 Echo: EF 50-55%.   Moderate mitral regurgitation    a. 02/2021 Echo: Mod MR.   MRSA infection within last 3 months 02/25/2016   Osteomyelitis of foot (HCC) 08/26/2016   Type II diabetes mellitus (HCC)    Past Surgical History:  Procedure Laterality Date   CHOLECYSTECTOMY  1999   COLONOSCOPY WITH PROPOFOL N/A 03/25/2021   Procedure: COLONOSCOPY WITH PROPOFOL;  Surgeon: [name], MD;  Location: [facility] ENDOSCOPY;  Service: Gastroenterology;  Laterality: N/A;   COLONOSCOPY WITH PROPOFOL N/A 04/17/2021   Procedure: COLONOSCOPY WITH PROPOFOL;  Surgeon: [name], MD;  Location: [facility] ENDOSCOPY;  Service: Gastroenterology;  Laterality: N/A;   ESOPHAGOGASTRODUODENOSCOPY (EGD) WITH PROPOFOL N/A 03/25/2021   Procedure: ESOPHAGOGASTRODUODENOSCOPY (EGD) WITH PROPOFOL;  Surgeon: [name], MD;  Location: [facility] ENDOSCOPY;  Service: Gastroenterology;  Laterality: N/A;   ESOPHAGOGASTRODUODENOSCOPY (EGD) WITH PROPOFOL N/A 04/17/2021   Procedure: ESOPHAGOGASTRODUODENOSCOPY (EGD) WITH PROPOFOL;  Surgeon: [name], MD;  Location: [facility] ENDOSCOPY;  Service: Gastroenterology;  Laterality: N/A;   ESOPHAGOGASTRODUODENOSCOPY (EGD) WITH PROPOFOL N/A 03/23/2023   Procedure: ESOPHAGOGASTRODUODENOSCOPY (EGD)  WITH PROPOFOL;  Surgeon: [name], MD;  Location: [facility] ENDOSCOPY;  Service: Gastroenterology;  Laterality: N/A;   RIGHT/LEFT HEART CATH AND CORONARY ANGIOGRAPHY Bilateral 04/14/2021   Procedure: RIGHT/LEFT HEART CATH AND CORONARY ANGIOGRAPHY;  Surgeon: Yvonne Kendall, MD;  Location: [facility] INVASIVE CV LAB;  Service: Cardiovascular;  Laterality: Bilateral;   TOE SURGERY Left 02/07/2016   Pinky Toe   FAMILY HISTORY Family History  Problem Relation Age of Onset   Diabetes Mother    Ulcers Mother    Heart disease Father    AAA (abdominal aortic aneurysm) Father    Diabetes Father    Hypertension Father    Stroke Father    Alzheimer's disease Father    Heart attack Sister    Seizures Brother    Diabetes Maternal Grandmother    Breast cancer Maternal Grandmother    SOCIAL  HISTORY Social History   Tobacco Use   Smoking status: Never   Smokeless tobacco: Never  Vaping Use   Vaping status: Never Used  Substance Use Topics   Alcohol use: No    Alcohol/week: 0.0 standard drinks of alcohol   Drug use: No       OPHTHALMIC EXAM:  Base Eye Exam     Visual Acuity (Snellen - Linear)       Right Left   Dist Staley 20/50 20/40 -2   Dist ph cc NI NI         Tonometry (Tonopen, 2:30 PM)       Right Left   Pressure 12 14         Pupils       Pupils Dark Light Shape React APD   Right PERRL 3 2 Round Brisk None   Left PERRL 3 2 Round Brisk None         Visual Fields       Left Right    Full Full         Extraocular Movement       Right Left    Full, Ortho Full, Ortho         Neuro/Psych     Oriented x3: Yes         Dilation     Both eyes: 2.5% Phenylephrine @ 2:30 PM           Slit Lamp and Fundus Exam     Slit Lamp Exam       Right Left   Lids/Lashes Normal Normal   Conjunctiva/Sclera mild melanosis mild melanosis   Cornea trace PEE 1-2+ Punctate epithelial erosions   Anterior Chamber deep and clear deep and clear   Iris Round and  dilated, No NVI Round and dilated, No NVI   Lens 1+ Cortical cataract 1-2+ Cortical cataract, 1-2+ Posterior subcapsular cataract   Anterior Vitreous Vitreous syneresis, +RBCs, blood stained vitreous condensations Vitreous syneresis, +RBCs         Fundus Exam       Right Left   Disc Pink and Sharp, no NVD Pink and Sharp, no NVD   C/D Ratio 0.3 0.2   Macula hazy view, Flat, scattered MA / DBH, +fibrosis Flat, Blunted foveal reflex, scattered MA, DBH and exudates   Vessels attenuated, Tortuous, +fibrosis along temporal arcades attenuated, Tortuous, +NVE   Periphery hazy view, grossly attached, scattered DBH, tractional fibrosis along IT arcades Attached, scattered MA / DBH, scattered PRP with room for fill in           IMAGING AND PROCEDURES  Imaging and Procedures for 11/02/2023  OCT, Retina - OU - Both Eyes       Right Eye Quality was good. Central Foveal Thickness: 260. Progression has been stable. Findings include no SRF, abnormal foveal contour, intraretinal hyper-reflective material, epiretinal membrane, intraretinal fluid (+vitreous opacities, focal tractional edema inferior periphery caught on widefield, mild ERM).   Left Eye Quality was good. Central Foveal Thickness: 254. Progression has improved. Findings include normal foveal contour, no SRF, intraretinal hyper-reflective material, intraretinal fluid, vitreomacular adhesion (mild scattered cystic changes -- slightly improved).   Notes *Images captured and stored on drive  Diagnosis / Impression:  +DME OU OD: +vitreous opacities, focal tractional edema inferior periphery caught on widefield, mild ERM OS: mild scattered cystic changes -- slightly improved  Clinical management:  See below  Abbreviations: NFP - Normal foveal profile. CME - cystoid macular edema. PED -  pigment epithelial detachment. IRF - intraretinal fluid. SRF - subretinal fluid. EZ - ellipsoid zone. ERM - epiretinal membrane. ORA - outer retinal  atrophy. ORT - outer retinal tubulation. SRHM - subretinal hyper-reflective material. IRHM - intraretinal hyper-reflective material      Panretinal Photocoagulation - OS - Left Eye       Time Out Confirmed correct patient, procedure, site, and patient consented.   Notes LASER PROCEDURE NOTE  Diagnosis:   Proliferative Diabetic Retinopathy, LEFT EYE  Procedure:  Pan-retinal photocoagulation using slit lamp laser, LEFT EYE  Anesthesia:  Topical  Surgeon: Rennis Chris, MD, PhD   Informed consent obtained, operative eye marked, and time out performed prior to initiation of laser.   Lumenis [id number] slit lamp laser Pattern: 3x3 square Power: 340 mW Duration: 40 msec  Spot size: 200 microns  # spots: 1906 spots  Complications: None.  Notes: mild vitreous opacities and cortical cataract obscuring view and preventing laser up take in scattered focal areas  RTC: Dec 3 - DFE/OCT, possible injxn  Patient tolerated the procedure well and received written and verbal post-procedure care information/education.           ASSESSMENT/PLAN:   ICD-10-CM   1. 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 edema associated with type 2 diabetes mellitus (HCC)  E11.3513 OCT, Retina - OU - Both Eyes    Panretinal Photocoagulation - OS - Left Eye    2. Encounter for long-term (current) use of insulin (HCC)  Z79.4     3. Long term (current) use of oral hypoglycemic drugs  Z79.84     4. Vitreous hemorrhage, right eye (HCC)  H43.11     5. Essential hypertension  I10     6. Hypertensive retinopathy of both eyes  H35.033     7. Combined forms of age-related cataract of both eyes  H25.813      1-4. Proliferative diabetic retinopathy, both eyes  - previously managed [facility]  - h/o anti-VEGF therapy and PRP OS  - last A1c 8.4 on 07.08.24  - s/p IVA OU # 1 (11.05.24) - exam shows blood stained vit condensations and tractional fibrosis OD, scattered  MA/DBH OU - FA (11.05.24) shows OD: Scattered punctate leaking NV, scattered blockage from [facility], scattered patches of vascular non-perfusion, leaking MA; OS: Scattered leaking NV -- greatest SN midzone, scattered patches of vascular non-perfusion, leaking MA, scattered incomplete PRP - OCT shows OD: +vitreous opacities, focal tractional edema inferior periphery caught on widefield, mild ERM; OS: mild scattered cystic changes -- slightly improved - recommend PRP OS today, 11.18.24 - pt wishes to proceed with laser - RBA of procedure discussed, questions answered - informed consent obtained and signed - see procedure note - start PF QID OS x7 days - f/u December 3rd -- DFE/OCT, possible injections OU  5,6. Hypertensive retinopathy OU - discussed importance of tight BP control - monitor  7. Mixed Cataract OU - The symptoms of cataract, surgical options, and treatments and risks were discussed with patient. - discussed diagnosis and progression - monitor  Ophthalmic Meds Ordered this visit:  Meds ordered this encounter  Medications   prednisoLONE acetate (PRED FORTE) 1 % ophthalmic suspension    Sig: Place 1 drop into the left eye 4 (four) times daily.    Dispense:  10 mL    Refill:  0     Return in 2 weeks (on 11/16/2023) for PDR OU, DFE, OCT, Possible Injxn.  There are no Patient  Instructions on file for this visit.  Explained the diagnoses, plan, and follow up with the patient and they expressed understanding.  Patient expressed understanding of the importance of proper follow up care.   This document serves as a record of services personally performed by Karie Chimera, MD, PhD. It was created on their behalf by Charlette Caffey, COT an ophthalmic technician. The creation of this record is the provider's dictation and/or activities during the visit.    Electronically signed by:  Charlette Caffey, COT  11/02/23 3:43 PM  This document serves as a record of services personally  performed by Karie Chimera, MD, PhD. It was created on their behalf by Glee Arvin. Manson Passey, OA an ophthalmic technician. The creation of this record is the provider's dictation and/or activities during the visit.    Electronically signed by: Glee Arvin. Manson Passey, OA 11/02/23 3:43 PM  Karie Chimera, M.D., Ph.D. Diseases & Surgery of the Retina and Vitreous [facility] 11/02/2023  I have reviewed the above documentation for accuracy and completeness, and I agree with the above. Karie Chimera, M.D., Ph.D. 11/02/23 3:45 PM   Abbreviations: M myopia (nearsighted); A astigmatism; H hyperopia (farsighted); P presbyopia; Mrx spectacle prescription;  CTL contact lenses; OD right eye; OS left eye; OU both eyes  XT exotropia; ET esotropia; PEK punctate epithelial keratitis; PEE punctate epithelial erosions; DES dry eye syndrome; MGD meibomian gland dysfunction; ATs artificial tears; PFAT's preservative free artificial tears; NSC nuclear sclerotic cataract; PSC posterior subcapsular cataract; ERM epi-retinal membrane; PVD posterior vitreous detachment; RD retinal detachment; DM diabetes mellitus; DR diabetic retinopathy; NPDR non-proliferative diabetic retinopathy; PDR proliferative diabetic retinopathy; CSME clinically significant macular edema; DME diabetic macular edema; dbh dot blot hemorrhages; CWS cotton wool spot; POAG primary open angle glaucoma; C/D cup-to-disc ratio; HVF humphrey visual field; GVF goldmann visual field; OCT optical coherence tomography; IOP intraocular pressure; BRVO Branch retinal vein occlusion; CRVO central retinal vein occlusion; CRAO central retinal artery occlusion; BRAO branch retinal artery occlusion; RT retinal tear; SB scleral buckle; PPV pars plana vitrectomy; VH Vitreous hemorrhage; PRP panretinal laser photocoagulation; IVK intravitreal kenalog; VMT vitreomacular traction; MH Macular hole;  NVD neovascularization of the disc; NVE neovascularization  elsewhere; AREDS age related eye disease study; ARMD age related macular degeneration; POAG primary open angle glaucoma; EBMD epithelial/anterior basement membrane dystrophy; ACIOL anterior chamber intraocular lens; IOL intraocular lens; PCIOL posterior chamber intraocular lens; Phaco/IOL phacoemulsification with intraocular lens placement; PRK photorefractive keratectomy; LASIK laser assisted in situ keratomileusis; HTN hypertension; DM diabetes mellitus; COPD chronic obstructive pulmonary disease

## 2023-10-23 ENCOUNTER — Other Ambulatory Visit: Payer: Self-pay | Admitting: Internal Medicine

## 2023-10-24 NOTE — Progress Notes (Deleted)
Cardiology Office Note:  .   Date:  10/24/2023  ID:  Sarah Duffy, DOB [birth year], MRN [id number] PCP: [name], MD  [facility] Providers Cardiologist:  [name], MD { Click to update primary MD,subspecialty MD or APP then REFRESH:1}   History of Present Illness: .   Sarah Duffy is a 52 y.o. female with a past medical history of coronary disease, chronic heart failure with improved EF, ischemic cardiomyopathy, hypertension, hyperlipidemia, type 2 diabetes, CKD stage III, MRSA/osteomyelitis of the foot (2017), who is here to follow-up on her HFimpEF.   She previously been evaluated in March with transient chest discomfort concerning the following death of her mother.  Echocardiogram at that time revealed an LVEF of 35-40% with global hypokinesis, G1 DD.  Diagnostic catheterization was performed and showed severe multivessel CAD.  She was referred to CT surgery in September 2022 with recommendation for CABG.  It was initially scheduled for October 2022 but was subsequently canceled secondary to the start of an illness.  Echocardiogram in July 2023 showed an EF of 50-55%, G2 DD, mild MR.  She was referred back to CT surgery in August 2023 and deferred proceeding with CABG due to change in her medical insurance.  She was seen in clinic 03/03/2023 at that time she states she was doing well.  Amlodipine was discontinued with plan to titrate Entresto in the future.  She had recently been diagnosed with flu and had recovered.  She had not made a follow-up appointment with CT surgery because of ongoing issues with insurance.  There were no medication changes that were made and she was scheduled for labs and encouraged to make her follow-up with CVTS.   She was last seen in clinic 07/23/2023.  At that point time for the cardiac perspective she was doing fairly well.  She denied any chest pain, shortness of breath, palpitations or peripheral edema.  She been tolerating medication well.   She did receive a call from the pharmacy questioning if she was able to stop her ezetimibe but she was unsure of the reason.  She had also made her follow-up appointment with CT surgery.  She was continued on her current medication regimen.  She was sent for labs and encouraged to continue to monitor pressure at home.  She followed up with Dr. Lavinia Sharps CV TS on 08/25/2023.  At that time overall she continued to feel well.  She denied any chest pain or pressure.  She had no shortness of breath or exertional fatigue.  Had recently followed with her PCP and was noted to have an elevated glucose and a hemoglobin A1c that was elevated at 8.4.  She was scheduled to see her endocrinologist in the near future and advised to have that taken care of.  She is to follow back up to schedule coronary bypass surgery after she had seen her endocrinologist and a dentist about a tooth.  She returns to clinic today  ROS: 10 point review of system has been reviewed and considered negative with exception of what is been listed in the HPI  Studies Reviewed: Marland Kitchen        TTE 06/23/22 1. Left ventricular ejection fraction, by estimation, is 50 to 55%. Left  ventricular ejection fraction by 3D volume is 52 %. Left ventricular  ejection fraction by PLAX is 53 %. The left ventricle has low normal  function. The left ventricle has no  regional wall motion abnormalities. Left ventricular diastolic parameters  are consistent with  Grade II diastolic dysfunction (pseudonormalization).  The average left ventricular global longitudinal strain is -18.9 %.   2. Right ventricular systolic function is normal. The right ventricular  size is normal. There is normal pulmonary artery systolic pressure. The  estimated right ventricular systolic pressure is 21.0 mmHg.   3. Left atrial size was mildly dilated.   4. The mitral valve is normal in structure. Mild mitral valve  regurgitation. No evidence of mitral stenosis.   5. The aortic valve  is normal in structure. Aortic valve regurgitation is  not visualized. Aortic valve sclerosis is present, with no evidence of  aortic valve stenosis.   6. The inferior vena cava is normal in size with greater than 50%  respiratory variability, suggesting right atrial pressure of 3 mmHg.    R/LHC 04/14/21 Conclusions: Multivessel coronary artery disease including mild to moderate diffuse plaquing of the LAD and RCA.  The mid/distal LAD demonstrates 3 focal lesions of up to 80% that are hemodynamically significant by iFR (iFR = 0.71).  There is also a 99% stenosis of OM2 with faint left to left collaterals supplying the distal branch as well as sequential 60% distal RCA and 70-80% proximal RPDA lesions. Upper normal to mildly elevated left heart filling pressure. Normal right heart and pulmonary artery pressures. Normal cardiac output/index.   Recommendations: In the setting of multivessel coronary artery disease, diabetes mellitus, and reduced LVEF that appears to be due to ischemic cardiomyopathy, consultation with cardiac surgery for consideration of CABG is recommended. Initiate aspirin 81 mg daily. Increase carvedilol to 12.5 mg twice daily with further escalation of goal-directed medical therapy for chronic HFrEF as tolerated. Aggressive secondary prevention of coronary artery disease.   TTE 03/12/21 1. Left ventricular ejection fraction, by estimation, is 35 to 40%. The  left ventricle has moderately decreased function. The left ventricle  demonstrates global hypokinesis. Left ventricular diastolic parameters are  consistent with Grade I diastolic  dysfunction (impaired relaxation). The average left ventricular global  longitudinal strain is -13.5 %. The global longitudinal strain is  abnormal.   2. Right ventricular systolic function is normal. The right ventricular  size is normal.   3. Left atrial size was mildly dilated.   4. The mitral valve is normal in structure. Moderate  mitral valve  regurgitation.  Risk Assessment/Calculations:     No BP recorded.  {Refresh Note OR Click here to enter BP  :1}***       Physical Exam:   VS:  LMP  (LMP Unknown)    Wt Readings from Last 3 Encounters:  10/15/23 175 lb (79.4 kg)  10/04/23 175 lb 9.6 oz (79.7 kg)  09/20/23 167 lb 6.4 oz (75.9 kg)    GEN: Well nourished, well developed in no acute distress NECK: No JVD; No carotid bruits CARDIAC: ***RRR, no murmurs, rubs, gallops RESPIRATORY:  Clear to auscultation without rales, wheezing or rhonchi  ABDOMEN: Soft, non-tender, non-distended EXTREMITIES:  No edema; No deformity   ASSESSMENT AND PLAN: .   ***    {Are you ordering a CV Procedure (e.g. stress test, cath, DCCV, TEE, etc)?   Press F2        :[id number]}  Dispo: ***  Signed, Voncille Simm, NP

## 2023-10-25 ENCOUNTER — Ambulatory Visit: Payer: Medicaid Other

## 2023-10-25 DIAGNOSIS — M6281 Muscle weakness (generalized): Secondary | ICD-10-CM

## 2023-10-25 DIAGNOSIS — M5459 Other low back pain: Secondary | ICD-10-CM

## 2023-10-25 NOTE — Therapy (Signed)
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 02/2021 Echo: EF 35-40%, glob HK, GrI DD, nl RV fxn, mildly dil LA, mod MR; b. 06/2022 Echo: EF 50-55%, no rwma, GrII DD, nl RV fxn, RVSP , mildly dil LA, mild MR, AoV sclerosis.   CKD (chronic kidney disease), stage III (HCC)    Complication of anesthesia 03/23/2023   Possible gastroparesis based off food in stomach during EGD   Coronary artery disease    a. 04/2021 Cath: LM nl, LAD 70p/m, 34m, 80d, LCX 54m, OM1 40, OM2 99 (fills via collats from OM1), RCA 60d, RPDA 75.   COVID-19 2021   Hyperlipidemia LDL goal <70    Hypertension    Ischemic cardiomyopathy    a. 02/2021 Echo: EF 35-40%, glob HK, GrI DD; b. 06/2022 Echo: EF 50-55%.   Moderate mitral regurgitation    a. 02/2021 Echo: Mod MR.   MRSA infection within last 3 months 02/25/2016   Osteomyelitis of foot (HCC) 08/26/2016   Type II diabetes mellitus (HCC)    Past Surgical History:  Procedure Laterality Date   CHOLECYSTECTOMY  1999   COLONOSCOPY WITH PROPOFOL N/A 03/25/2021   Procedure: COLONOSCOPY WITH PROPOFOL;  Surgeon: [name], MD;  Location: [facility] ENDOSCOPY;  Service: Gastroenterology;  Laterality: N/A;   COLONOSCOPY WITH PROPOFOL N/A 04/17/2021   Procedure: COLONOSCOPY WITH PROPOFOL;  Surgeon: [name], MD;  Location: [facility] ENDOSCOPY;  Service: Gastroenterology;  Laterality: N/A;    ESOPHAGOGASTRODUODENOSCOPY (EGD) WITH PROPOFOL N/A 03/25/2021   Procedure: ESOPHAGOGASTRODUODENOSCOPY (EGD) WITH PROPOFOL;  Surgeon: [name], MD;  Location: [facility] ENDOSCOPY;  Service: Gastroenterology;  Laterality: N/A;   ESOPHAGOGASTRODUODENOSCOPY (EGD) WITH PROPOFOL N/A 04/17/2021   Procedure: ESOPHAGOGASTRODUODENOSCOPY (EGD) WITH PROPOFOL;  Surgeon: [name], MD;  Location: [facility] ENDOSCOPY;  Service: Gastroenterology;  Laterality: N/A;   ESOPHAGOGASTRODUODENOSCOPY (EGD) WITH PROPOFOL N/A 03/23/2023   Procedure: ESOPHAGOGASTRODUODENOSCOPY (EGD) WITH PROPOFOL;  Surgeon: [name], MD;  Location: [facility] ENDOSCOPY;  Service: Gastroenterology;  Laterality: N/A;   RIGHT/LEFT HEART CATH AND CORONARY ANGIOGRAPHY Bilateral 04/14/2021   Procedure: RIGHT/LEFT HEART CATH AND CORONARY ANGIOGRAPHY;  Surgeon: Yvonne Kendall, MD;  Location: [facility] INVASIVE CV LAB;  Service: Cardiovascular;  Laterality: Bilateral;   TOE SURGERY Left 02/07/2016   Pinky Toe   Patient Active Problem List   Diagnosis Date Noted   Weakness of extremity 09/20/2023   Arthropathy of lumbar facet joint 07/29/2023   Low back pain 07/29/2023   Pain in finger of left hand 07/29/2023   Sacroiliac joint pain 07/29/2023   Contact dermatitis and eczema 06/08/2023   Dysphagia 03/23/2023   Asthma, well controlled 03/09/2023   Depression, major, in remission (HCC) 03/09/2023   Perennial allergic rhinitis with seasonal variation 03/09/2023   Type 2 diabetes mellitus with diabetic polyneuropathy, with long-term current use of  insulin (HCC) 08/31/2022   Type 2 diabetes mellitus with retinopathy, with long-term current use of insulin (HCC) 08/31/2022   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(HCC) 08/31/2022   Hypertension 06/11/2022   Ischemic cardiomyopathy 06/11/2022   Coronary artery disease 06/11/2022   Chronic systolic heart failure (HCC) 06/11/2022   Angina pectoris associated with type 2 diabetes mellitus (HCC) 01/05/2022    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 edema associated with type 2 diabetes mellitus (HCC) 05/01/2021   Cardiomyopathy (HCC) 04/14/2021   Depression, major, recurrent, mild (HCC) 09/25/2019   History of 2019 novel coronavirus disease (COVID-19) 06/23/2019   Migraine headache without aura 01/14/2018   Type II diabetes mellitus (HCC) 05/22/2017   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 associated with type 2 diabetes mellitus (HCC) 05/21/2016   Severe nonproliferative diabetic retinopathy of right eye without macular edema associated with type 2 diabetes mellitus (HCC) 05/21/2016   Hyperlipidemia LDL goal <70 03/10/2016   MI (mitral incompetence) 02/25/2016   Insomnia 02/25/2016   Anxiety 09/04/2014    PCP: [name], MD  REFERRING PROVIDER: [name], MD  REFERRING DIAG:  M54.50 (ICD-10-CM) - Intermittent low back pain    Rationale for Evaluation and Treatment: Rehabilitation  THERAPY DIAG:  Other low back pain  Muscle weakness (generalized)  ONSET DATE: June, 2024   SUBJECTIVE:                                                                                                                                                                                           SUBJECTIVE STATEMENT:  Pt reports 8/10 NPS L sided LBP. Feels like it has a "knot".   PERTINENT HISTORY:  Pt presents to OPPT with LBP and balance deficits that started in June, 2024. During this time she was having very severe migraines w/ light sensitivity and was receiving medical treatment. Over the past several months,following a viral infection, pt has reported speech difficulties with certain words, weight loss of 10-15# and balance deficits in addition to her back pain. Also with ED/urgent care visit for nausea and recent rash on LE's and difficulty with mobilizing her LLE due to significant weakness. Pt has seen her PCP and an orthopedic  specialist regarding her LBP. Pt reports increased difficulty with ascending stairs, and getting in and out of a car or bathtub. Pt reports her pain at best is a 3/10NPS and at worse is an 8/10. Pt's LBP is worse in the morning, and gets better throughout the day. Pt's pain is relieved with heat and Tylenol.   PAIN:  Are you having pain? Yes: NPRS scale:  8/10 Pain location: Bilat para-spinals  Pain description: Dull and achey, sharp at night into R buttocks  Aggravating factors: Going up stairs, getting in and out of a car, getting in and out of the tub  Relieving factors: Heat, Tylenol  Worst pain 8/10 LBP is worse in the morning, less throughout the day  PRECAUTIONS: Fall  RED FLAGS: None   WEIGHT BEARING RESTRICTIONS: No  FALLS:  Has patient fallen in last 6 months? No  LIVING ENVIRONMENT:  Stairs: Yes: External: 3 steps; on left going up   OCCUPATION: [employer]   PLOF: Independent  PATIENT GOALS: Be able to move better  NEXT MD VISIT: 09/20/23  OBJECTIVE:  Note: Objective measures were completed at Evaluation unless otherwise noted.  DIAGNOSTIC FINDINGS:  Pt reports X-rays taken at [facility], she states that there were no notable findings from x-rays.   PATIENT SURVEYS:  FOTO 43/60  SCREENING FOR RED FLAGS: Bowel or bladder incontinence: No   COGNITION: Overall cognitive status: Within functional limits for tasks assessed     SENSATION: WFL   PALPATION: No TTP noted.   LUMBAR ROM:   AROM eval  Flexion WFL  Extension WFL  Right lateral flexion WFL*  Left lateral flexion WFL*  Right rotation WFL  Left rotation WFL    (Blank rows = not tested)  LOWER EXTREMITY ROM: All LE ROM WFL   LOWER EXTREMITY MMT:    MMT Right eval Left eval  Hip flexion 3+ 2  Hip extension 2+ 2+  Hip abduction 3 3  Hip adduction 3 3  Hip internal rotation 3+ 3+  Hip external rotation 3 3  Knee flexion 4 4  Knee extension 4 4  Ankle dorsiflexion 4 4-   Ankle plantarflexion 4 4   (Blank rows = not tested)  LUMBAR SPECIAL TESTS:   SI Compression/distraction test: Negative, FABER test: Negative, and FADDIR: (R) negative (L) positive Scour bilat: Negative, Thigh Thrust bilat: Negative  REFLEXES:   R/ L patellar tendon: areflexive; R/L achilles tendon: areflexive  FUNCTIONAL TESTS:  5 times sit to stand: 11.7 seconds Timed up and go (TUG): 8.28 seconds Functional gait assessment: 18/30  GAIT: Comments: Increased ER of LLE with ambulation, decreased ability to ambulate in a straight line  TODAY'S TREATMENT:                                                                                                                              DATE: 10/25/23  There.ex: TM 1.5 MPH at 2.5% incline: 3 minutes total for warm up for improved hip flexion/ankle DF clearance for swing phase of gait, hip extension strengthening.   MH applied in supine: Supine LTR's x15/ each side  Supine single knee to chest RLE/LLE 1x10, 5 sec holds   STS from plinth 1x8 w/ no UE support. 1x8 with GTB at distal femurs  Alternating 8" step ups with SUE support, CGA: 2x8/side  Alternating lunges with  TRX strap support: 2x6/side, CGA  Lateral side stepping, 6 hurdles: x4 down and back, CGA.    PATIENT EDUCATION:  Education details: Educated pt on findings from evaluation (strength deficits, subjective)  Person educated: Patient Education method: Explanation Education comprehension: verbalized understanding  HOME EXERCISE PROGRAM: Access Code: [email or web address] URL: [email or web address] Date: 10/13/2023 Prepared by: Ronnie Derby  Exercises - Sit to Stand with Arms Crossed  - 1 x daily - 4-5 x weekly - 3 sets - 6-8 reps - Seated March  - 1 x daily - 4-5 x weekly - 3 sets - 6-8 reps - Clamshell  - 1 x daily - 4-5 x weekly - 3 sets - 10 reps - Sidelying Reverse Clamshell  - 1 x daily - 4-5 x weekly - 3 sets - 10 reps  ASSESSMENT:  CLINICAL  IMPRESSION: Continuing PT POC with focus on improving lumbar mobility, decrease lumbar pain, and dynamic balance with LLE hip flexion strengthening. Pt tolerating LE hip strengthening exercises with addition of resistance also reporting pain reduced to 6/10 NPS post session. Pt continues to demo reduced balance with SLS and foot clearance activtiies leading to increased falls risk along with single LE weakness as evidenced by alternating lunges. Pt will continue to benefit from skilled PT services to progress Balance and strength exercises to return to pain free work tasks and reduced falls risk.   OBJECTIVE IMPAIRMENTS: decreased mobility, difficulty walking, decreased strength, and pain.   ACTIVITY LIMITATIONS: carrying, lifting, squatting, stairs, and transfers  PARTICIPATION LIMITATIONS: driving, community activity, and occupation  PERSONAL FACTORS: Past/current experiences and Time since onset of injury/illness/exacerbation are also affecting patient's functional outcome.   REHAB POTENTIAL: Good  CLINICAL DECISION MAKING: Evolving/moderate complexity  EVALUATION COMPLEXITY: Moderate   GOALS: Goals reviewed with patient? Yes  SHORT TERM GOALS: Target date: 10/14/23  Pt will be independent with HEP to improve bilat LE strength and reduce low back pain with functional activities  Baseline: 09/16/23: Deferred to next session Goal status: INITIAL   LONG TERM GOALS: Target date: 11/11/23  Pt will improve FOTO to target score to demonstrate clinically significant improvement in functional mobility.   Baseline: 09/16/23: 43/60 Goal status: INITIAL  2.  Pt to improve 5x STS to < 9.7 seconds to display improved functional mobility and LE strength to meet age matched norms.  Baseline: 09/16/23: Deferred to next session 10/11/23: 11.7 seconds Goal status: INITIAL  3.  Pt to improve TUG time by at least 3.4 seconds for clinically significant improvement for decreased risk of falls.   Baseline: 09/16/23: Deferred to next session 10/11/23: 8.28 seconds Goal status: INITIAL  4.   Pt will improve FGA score by 4 points to display clinically significant improvement in balance.  Baseline: 09/16/23: Deferred to next session 10/11/23: 18/30 Goal status: INITIAL  5.  Pt will improve all bilat LE AROM to </= 4/5 MMT to display functional improvements in bilat LE strength.  Baseline: 09/16/23-  MMT Right eval Left eval  Hip flexion 3+ 2  Hip extension 2+ 2+  Hip abduction 3 3  Hip adduction 3 3  Hip internal rotation 3+ 3+  Hip external rotation 3 3  Knee flexion 4 4  Knee extension 4 4  Ankle dorsiflexion 4 4-  Ankle plantarflexion 4 4   Goal status: INITIAL   PLAN:  PT FREQUENCY: 1x/week  PT DURATION: 8 weeks  PLANNED INTERVENTIONS: Therapeutic exercises, Therapeutic activity, Neuromuscular re-education, Balance training, Gait training, Patient/Family education, Self Care, Joint mobilization, Stair training,  Electrical stimulation, Spinal manipulation, Spinal mobilization, Cryotherapy, Moist heat, and Re-evaluation.  PLAN FOR NEXT SESSION: Tandem stance exercises, obstacle negotiation, progress bilat LE strengthening exercises.   Delphia Grates. Fairly IV, PT, DPT Physical Therapist- [place]  [facility]  10/25/2023, 1:52 PM

## 2023-10-26 ENCOUNTER — Ambulatory Visit: Payer: Medicaid Other | Admitting: Cardiology

## 2023-10-26 ENCOUNTER — Other Ambulatory Visit: Payer: Self-pay | Admitting: Physician Assistant

## 2023-10-26 DIAGNOSIS — G8929 Other chronic pain: Secondary | ICD-10-CM

## 2023-10-27 ENCOUNTER — Ambulatory Visit: Payer: Medicaid Other

## 2023-10-27 NOTE — Telephone Encounter (Signed)
Requested Prescriptions  Pending Prescriptions Disp Refills   naproxen (NAPROSYN) 500 MG tablet [Pharmacy Med Name: NAPROXEN 500 MG TABLET] 180 tablet 0    Sig: TAKE 1 TABLET BY MOUTH 2 TIMES DAILY WITH A MEAL.     Analgesics:  NSAIDS Failed - 10/26/2023  1:20 AM      Failed - Manual Review: Labs are only required if the patient has taken medication for more than 8 weeks.      Failed - Cr in normal range and within 360 days    Creat  Date Value Ref Range Status  03/09/2023 0.93 0.50 - 1.03 mg/dL Final   Creatinine, Ser  Date Value Ref Range Status  07/23/2023 1.15 (H) 0.57 - 1.00 mg/dL Final   Creatinine, Urine  Date Value Ref Range Status  03/09/2023 72 20 - 275 mg/dL Final         Failed - HGB in normal range and within 360 days    Hemoglobin  Date Value Ref Range Status  07/03/2023 10.7 (L) 12.0 - 15.0 g/dL Final  38/75/6433 29.5 (L) 11.1 - 15.9 g/dL Final         Failed - HCT in normal range and within 360 days    HCT  Date Value Ref Range Status  07/03/2023 31.7 (L) 36.0 - 46.0 % Final   Hematocrit  Date Value Ref Range Status  04/11/2021 32.3 (L) 34.0 - 46.6 % Final         Passed - PLT in normal range and within 360 days    Platelets  Date Value Ref Range Status  07/03/2023 216 150 - 400 K/uL Final  04/11/2021 249 150 - 450 x10E3/uL Final         Passed - eGFR is 30 or above and within 360 days    GFR, Est African American  Date Value Ref Range Status  02/11/2021 77 > OR = 60 mL/min/1.63m2 Final   GFR, Est Non African American  Date Value Ref Range Status  02/11/2021 67 > OR = 60 mL/min/1.53m2 Final   GFR, Estimated  Date Value Ref Range Status  07/03/2023 59 (L) >60 mL/min Final    Comment:    (NOTE) Calculated using the CKD-EPI Creatinine Equation (2021)    eGFR  Date Value Ref Range Status  07/23/2023 57 (L) >59 mL/min/1.73 Final         Passed - Patient is not pregnant      Passed - Valid encounter within last 12 months    Recent  Outpatient Visits           3 weeks ago Weakness of extremity   [facility] Mecum, Oswaldo Conroy, PA-C   1 month ago Weakness of extremity   [facility] Mecum, Oswaldo Conroy, PA-C   2 months ago Chronic systolic heart failure [facility])   [facility] [name], MD   4 months ago Intractable migraine without aura and with status migrainosus   [facility] [name], MD   4 months ago Migraine without aura and without status migrainosus, not intractable   [facility] Mecum, Oswaldo Conroy, PA-C       Future Appointments             In 5 days Charlsie Quest, NP [facility]   In 1 month [name], MD [facility]

## 2023-10-29 ENCOUNTER — Telehealth: Payer: Self-pay | Admitting: Family Medicine

## 2023-10-29 ENCOUNTER — Encounter: Payer: Self-pay | Admitting: Physician Assistant

## 2023-10-29 DIAGNOSIS — M5441 Lumbago with sciatica, right side: Secondary | ICD-10-CM | POA: Diagnosis not present

## 2023-10-29 DIAGNOSIS — M47816 Spondylosis without myelopathy or radiculopathy, lumbar region: Secondary | ICD-10-CM | POA: Diagnosis not present

## 2023-10-29 DIAGNOSIS — R29898 Other symptoms and signs involving the musculoskeletal system: Secondary | ICD-10-CM

## 2023-10-29 DIAGNOSIS — G8929 Other chronic pain: Secondary | ICD-10-CM

## 2023-10-29 DIAGNOSIS — M5442 Lumbago with sciatica, left side: Secondary | ICD-10-CM | POA: Diagnosis not present

## 2023-10-29 NOTE — Progress Notes (Signed)
Your CT scan results are back The imaging showed multilevel degenerative changes ( arthritis related changes) at several levels of your lower spine  It is recommended that we get a MRI of your lower back for better evaluation of these changes  Please let us know if you would like to proceed with this and we will place the order

## 2023-10-29 NOTE — Progress Notes (Unsigned)
Cardiology Office Note:  .   Date:  10/29/2023  ID:  Sarah Duffy, DOB 1971-05-28, MRN [id number] PCP: [name], MD  [facility] Providers Cardiologist:  [name], MD { Click to update primary MD,subspecialty MD or APP then REFRESH:1}   History of Present Illness: .   Sarah Duffy is a 52 y.o. female with a past medical history of coronary disease, chronic heart failure with improved EF, ischemic cardiomyopathy, hypertension, hyperlipidemia, type 2 diabetes, CKD stage III, MRSA/osteomyelitis of the foot (2017), who is here to follow-up on her HFimpEF.   She previously been evaluated in March with transient chest discomfort concerning the following death of her mother.  Echocardiogram at that time revealed an LVEF of 35-40% with global hypokinesis, G1 DD.  Diagnostic catheterization was performed and showed severe multivessel CAD.  She was referred to CT surgery in September 2022 with recommendation for CABG.  It was initially scheduled for October 2022 but was subsequently canceled secondary to the start of an illness.  Echocardiogram in July 2023 showed an EF of 50-55%, G2 DD, mild MR.  She was referred back to CT surgery in August 2023 and deferred proceeding with CABG due to change in her medical insurance.  She was seen in clinic 03/03/2023 at that time she states she was doing well.  Amlodipine was discontinued with plan to titrate Entresto in the future.  She had recently been diagnosed with flu and had recovered.  She had not made a follow-up appointment with CT surgery because of ongoing issues with insurance.  There were no medication changes that were made and she was scheduled for labs and encouraged to make her follow-up with CVTS.   She was last seen in clinic 07/23/2023.  At that point time for the cardiac perspective she was doing fairly well.  She denied any chest pain, shortness of breath, palpitations or peripheral edema.  She been tolerating medication well.   She did receive a call from the pharmacy questioning if she was able to stop her ezetimibe but she was unsure of the reason.  She had also made her follow-up appointment with CT surgery.  She was continued on her current medication regimen.  She was sent for labs and encouraged to continue to monitor pressure at home.  She followed up with Dr. Lavinia Sharps CV TS on 08/25/2023.  At that time overall she continued to feel well.  She denied any chest pain or pressure.  She had no shortness of breath or exertional fatigue.  Had recently followed with her PCP and was noted to have an elevated glucose and a hemoglobin A1c that was elevated at 8.4.  She was scheduled to see her endocrinologist in the near future and advised to have that taken care of.  She is to follow back up to schedule coronary bypass surgery after she had seen her endocrinologist and a dentist about a tooth.  She returns to clinic today  ROS: 10 point review of system has been reviewed and considered negative with exception of what is been listed in the HPI  Studies Reviewed: Marland Kitchen        TTE 06/23/22 1. Left ventricular ejection fraction, by estimation, is 50 to 55%. Left  ventricular ejection fraction by 3D volume is 52 %. Left ventricular  ejection fraction by PLAX is 53 %. The left ventricle has low normal  function. The left ventricle has no  regional wall motion abnormalities. Left ventricular diastolic parameters  are consistent with  Grade II diastolic dysfunction (pseudonormalization).  The average left ventricular global longitudinal strain is -18.9 %.   2. Right ventricular systolic function is normal. The right ventricular  size is normal. There is normal pulmonary artery systolic pressure. The  estimated right ventricular systolic pressure is 21.0 mmHg.   3. Left atrial size was mildly dilated.   4. The mitral valve is normal in structure. Mild mitral valve  regurgitation. No evidence of mitral stenosis.   5. The aortic valve  is normal in structure. Aortic valve regurgitation is  not visualized. Aortic valve sclerosis is present, with no evidence of  aortic valve stenosis.   6. The inferior vena cava is normal in size with greater than 50%  respiratory variability, suggesting right atrial pressure of 3 mmHg.    R/LHC 04/14/21 Conclusions: Multivessel coronary artery disease including mild to moderate diffuse plaquing of the LAD and RCA.  The mid/distal LAD demonstrates 3 focal lesions of up to 80% that are hemodynamically significant by iFR (iFR = 0.71).  There is also a 99% stenosis of OM2 with faint left to left collaterals supplying the distal branch as well as sequential 60% distal RCA and 70-80% proximal RPDA lesions. Upper normal to mildly elevated left heart filling pressure. Normal right heart and pulmonary artery pressures. Normal cardiac output/index.   Recommendations: In the setting of multivessel coronary artery disease, diabetes mellitus, and reduced LVEF that appears to be due to ischemic cardiomyopathy, consultation with cardiac surgery for consideration of CABG is recommended. Initiate aspirin 81 mg daily. Increase carvedilol to 12.5 mg twice daily with further escalation of goal-directed medical therapy for chronic HFrEF as tolerated. Aggressive secondary prevention of coronary artery disease.   TTE 03/12/21 1. Left ventricular ejection fraction, by estimation, is 35 to 40%. The  left ventricle has moderately decreased function. The left ventricle  demonstrates global hypokinesis. Left ventricular diastolic parameters are  consistent with Grade I diastolic  dysfunction (impaired relaxation). The average left ventricular global  longitudinal strain is -13.5 %. The global longitudinal strain is  abnormal.   2. Right ventricular systolic function is normal. The right ventricular  size is normal.   3. Left atrial size was mildly dilated.   4. The mitral valve is normal in structure. Moderate  mitral valve  regurgitation.  Risk Assessment/Calculations:     No BP recorded.  {Refresh Note OR Click here to enter BP  :1}***       Physical Exam:   VS:  LMP  (LMP Unknown)    Wt Readings from Last 3 Encounters:  10/15/23 175 lb (79.4 kg)  10/04/23 175 lb 9.6 oz (79.7 kg)  09/20/23 167 lb 6.4 oz (75.9 kg)    GEN: Well nourished, well developed in no acute distress NECK: No JVD; No carotid bruits CARDIAC: ***RRR, no murmurs, rubs, gallops RESPIRATORY:  Clear to auscultation without rales, wheezing or rhonchi  ABDOMEN: Soft, non-tender, non-distended EXTREMITIES:  No edema; No deformity   ASSESSMENT AND PLAN: .   ***    {Are you ordering a CV Procedure (e.g. stress test, cath, DCCV, TEE, etc)?   Press F2        :[id number]}  Dispo: ***  Signed, Amma Crear, NP

## 2023-10-29 NOTE — Telephone Encounter (Signed)
Pt is calling in because she received her imaging results in MyChart and wants to let Denny Peon know that she would like to proceed. Please follow up with pt.

## 2023-10-31 ENCOUNTER — Other Ambulatory Visit: Payer: Medicaid Other

## 2023-11-01 ENCOUNTER — Ambulatory Visit: Payer: Medicaid Other | Attending: Cardiology | Admitting: Cardiology

## 2023-11-01 ENCOUNTER — Encounter: Payer: Self-pay | Admitting: Cardiology

## 2023-11-01 ENCOUNTER — Ambulatory Visit: Payer: Medicaid Other

## 2023-11-01 ENCOUNTER — Telehealth: Payer: Self-pay | Admitting: Family Medicine

## 2023-11-01 VITALS — BP 124/72 | HR 83 | Ht 69.0 in | Wt 175.6 lb

## 2023-11-01 DIAGNOSIS — I2510 Atherosclerotic heart disease of native coronary artery without angina pectoris: Secondary | ICD-10-CM

## 2023-11-01 DIAGNOSIS — I340 Nonrheumatic mitral (valve) insufficiency: Secondary | ICD-10-CM

## 2023-11-01 DIAGNOSIS — I10 Essential (primary) hypertension: Secondary | ICD-10-CM

## 2023-11-01 DIAGNOSIS — N183 Chronic kidney disease, stage 3 unspecified: Secondary | ICD-10-CM | POA: Diagnosis not present

## 2023-11-01 DIAGNOSIS — E785 Hyperlipidemia, unspecified: Secondary | ICD-10-CM | POA: Diagnosis not present

## 2023-11-01 DIAGNOSIS — I255 Ischemic cardiomyopathy: Secondary | ICD-10-CM

## 2023-11-01 DIAGNOSIS — I5032 Chronic diastolic (congestive) heart failure: Secondary | ICD-10-CM | POA: Diagnosis not present

## 2023-11-01 NOTE — Telephone Encounter (Signed)
Melissaa w/ [facility] calling b/c the pt needs authorization for the MRI on Tues.  When Melissa looked it up it is for another facility.  ([facility]). But scheduled at [facility].  If you want pt to go to [facility], you need to change the authorization.  Melissa would like call back to discuss. [phone] ext [phone]

## 2023-11-01 NOTE — Patient Instructions (Signed)
Medication Instructions:  Your physician recommends that you continue on your current medications as directed. Please refer to the Current Medication list given to you today.  *If you need a refill on your cardiac medications before your next appointment, please call your pharmacy*  Lab Work: Your provider would like for you to have following labs drawn today CBC, BMP & Direct LDL.   If you have labs (blood work) drawn today and your tests are completely normal, you will receive your results only by: MyChart Message (if you have MyChart) OR A paper copy in the mail If you have any lab test that is abnormal or we need to change your treatment, we will call you to review the results.  Testing/Procedures: - None ordered  Follow-Up: At [facility], you and your health needs are our priority.  As part of our continuing mission to provide you with exceptional heart care, we have created designated Provider Care Teams.  These Care Teams include your primary Cardiologist (physician) and Advanced Practice Providers (APPs -  Physician Assistants and Nurse Practitioners) who all work together to provide you with the care you need, when you need it.  Your next appointment:   3 month(s)  Provider:   Charlsie Quest, NP   Other Instructions - None

## 2023-11-02 ENCOUNTER — Ambulatory Visit
Admission: RE | Admit: 2023-11-02 | Discharge: 2023-11-02 | Disposition: A | Discharge status: Home / Self Care | Payer: Medicaid Other | Attending: Physician Assistant | Admitting: Physician Assistant

## 2023-11-02 ENCOUNTER — Other Ambulatory Visit: Payer: Self-pay | Admitting: Physician Assistant

## 2023-11-02 ENCOUNTER — Encounter (INDEPENDENT_AMBULATORY_CARE_PROVIDER_SITE_OTHER): Payer: Self-pay | Admitting: Ophthalmology

## 2023-11-02 ENCOUNTER — Ambulatory Visit
Admission: RE | Admit: 2023-11-02 | Discharge: 2023-11-02 | Disposition: A | Discharge status: Home / Self Care | Payer: Medicaid Other | Source: Ambulatory Visit | Attending: Physician Assistant

## 2023-11-02 ENCOUNTER — Other Ambulatory Visit (HOSPITAL_COMMUNITY): Payer: Self-pay

## 2023-11-02 ENCOUNTER — Ambulatory Visit: Payer: Medicaid Other

## 2023-11-02 ENCOUNTER — Other Ambulatory Visit: Payer: Self-pay

## 2023-11-02 ENCOUNTER — Other Ambulatory Visit: Payer: Self-pay | Admitting: Orthopedic Surgery

## 2023-11-02 ENCOUNTER — Ambulatory Visit (INDEPENDENT_AMBULATORY_CARE_PROVIDER_SITE_OTHER): Payer: Medicaid Other | Admitting: Ophthalmology

## 2023-11-02 ENCOUNTER — Telehealth: Payer: Self-pay

## 2023-11-02 DIAGNOSIS — M545 Low back pain, unspecified: Secondary | ICD-10-CM | POA: Insufficient documentation

## 2023-11-02 DIAGNOSIS — M5126 Other intervertebral disc displacement, lumbar region: Secondary | ICD-10-CM | POA: Diagnosis not present

## 2023-11-02 DIAGNOSIS — I2510 Atherosclerotic heart disease of native coronary artery without angina pectoris: Secondary | ICD-10-CM | POA: Diagnosis not present

## 2023-11-02 DIAGNOSIS — M48061 Spinal stenosis, lumbar region without neurogenic claudication: Secondary | ICD-10-CM | POA: Diagnosis not present

## 2023-11-02 DIAGNOSIS — R29898 Other symptoms and signs involving the musculoskeletal system: Secondary | ICD-10-CM

## 2023-11-02 DIAGNOSIS — H4311 Vitreous hemorrhage, right eye: Secondary | ICD-10-CM | POA: Diagnosis not present

## 2023-11-02 DIAGNOSIS — M47816 Spondylosis without myelopathy or radiculopathy, lumbar region: Secondary | ICD-10-CM

## 2023-11-02 DIAGNOSIS — I10 Essential (primary) hypertension: Secondary | ICD-10-CM | POA: Diagnosis not present

## 2023-11-02 DIAGNOSIS — G8929 Other chronic pain: Secondary | ICD-10-CM | POA: Insufficient documentation

## 2023-11-02 DIAGNOSIS — H35033 Hypertensive retinopathy, bilateral: Secondary | ICD-10-CM | POA: Diagnosis not present

## 2023-11-02 DIAGNOSIS — Z7984 Long term (current) use of oral hypoglycemic drugs: Secondary | ICD-10-CM | POA: Diagnosis not present

## 2023-11-02 DIAGNOSIS — Z794 Long term (current) use of insulin: Secondary | ICD-10-CM | POA: Diagnosis not present

## 2023-11-02 DIAGNOSIS — M51369 Other intervertebral disc degeneration, lumbar region without mention of lumbar back pain or lower extremity pain: Secondary | ICD-10-CM | POA: Diagnosis not present

## 2023-11-02 DIAGNOSIS — H25813 Combined forms of age-related cataract, bilateral: Secondary | ICD-10-CM

## 2023-11-02 DIAGNOSIS — I5022 Chronic systolic (congestive) heart failure: Secondary | ICD-10-CM | POA: Diagnosis not present

## 2023-11-02 DIAGNOSIS — E113513 Type 2 diabetes mellitus with proliferative diabetic retinopathy with macular edema, bilateral: Secondary | ICD-10-CM

## 2023-11-02 DIAGNOSIS — M5441 Lumbago with sciatica, right side: Secondary | ICD-10-CM

## 2023-11-02 MED ORDER — PREDNISOLONE ACETATE 1 % OP SUSP: 1.0000 [drp] | Freq: Four times a day (QID) | OPHTHALMIC | 0 refills | Status: DC

## 2023-11-02 NOTE — Telephone Encounter (Signed)
Pharmacy Patient Advocate Encounter   Received notification from CoverMyMeds that prior authorization for Dexcom G7 Sensor is required/requested.   Insurance verification completed.   The patient is insured through [facility] .   Patient must have face-to-face appointment within the past 3 months.

## 2023-11-02 NOTE — Telephone Encounter (Signed)
Ordered and signed by Jacquelin Hawking

## 2023-11-02 NOTE — Telephone Encounter (Signed)
Imaging location changed and order updated.

## 2023-11-02 NOTE — Telephone Encounter (Signed)
[facility] called stating Authorization with [employer] has to state [facility] instead of [facility]. Kenney Houseman states [employer] needs to be contacted for that authorization to be updated.   Tanya requested message be sent to confirm.

## 2023-11-02 NOTE — Telephone Encounter (Signed)
Peggy from [facility] Centralized Scheduling. Stated Pt was scheduled at [facility]; however, per insurance, the patient cannot have an MRI done there.  Needs order faxed over to Centralized Scheduling for MR LUMBAR SPINE WO CONTRAST. Gigi Gin will scan in so pt can have this done at [facility] Location.  Pt is waiting; please send as soon as possible.  Please advise.

## 2023-11-03 LAB — LDL CHOLESTEROL, DIRECT: LDL Direct: 72 mg/dL (ref 0–99)

## 2023-11-03 LAB — BASIC METABOLIC PANEL
BUN/Creatinine Ratio: 19 (ref 9–23)
BUN: 23 mg/dL (ref 6–24)
CO2: 20 mmol/L (ref 20–29)
Calcium: 8.9 mg/dL (ref 8.7–10.2)
Chloride: 104 mmol/L (ref 96–106)
Creatinine, Ser: 1.21 mg/dL — ABNORMAL HIGH (ref 0.57–1.00)
Glucose: 338 mg/dL — ABNORMAL HIGH (ref 70–99)
Potassium: 5 mmol/L (ref 3.5–5.2)
Sodium: 138 mmol/L (ref 134–144)
eGFR: 54 mL/min/{1.73_m2} — ABNORMAL LOW (ref >59)

## 2023-11-03 LAB — CBC
Hematocrit: 34.5 % (ref 34.0–46.6)
Hemoglobin: 10.6 g/dL — ABNORMAL LOW (ref 11.1–15.9)
MCH: 28.5 pg (ref 26.6–33.0)
MCHC: 30.7 g/dL — ABNORMAL LOW (ref 31.5–35.7)
MCV: 93 fL (ref 79–97)
Platelets: 289 10*3/uL (ref 150–450)
RBC: 3.72 x10E6/uL — ABNORMAL LOW (ref 3.77–5.28)
RDW: 12.6 % (ref 11.7–15.4)
WBC: 8.2 10*3/uL (ref 3.4–10.8)

## 2023-11-03 NOTE — Progress Notes (Signed)
Blood counts remain stable. Kidney function remains slightly elevated. Maintain adequate hydration. Blood sugar over 300 that is followed by endocrinology.  LDL improved. Continue current medication regimen without changes.

## 2023-11-04 ENCOUNTER — Telehealth: Payer: Self-pay

## 2023-11-04 ENCOUNTER — Ambulatory Visit: Payer: Medicaid Other

## 2023-11-04 ENCOUNTER — Telehealth: Payer: Self-pay | Admitting: Family Medicine

## 2023-11-04 NOTE — Telephone Encounter (Signed)
Sarah Duffy from the Pre Service Dept stated they need someone to call insurance and change the location on the authorization from [facility] to [facility]. The insurance will not pay with the wrong location on the authorization

## 2023-11-04 NOTE — Telephone Encounter (Signed)
Called patient about missed appointment. No answer but LVM about next appointment date and time.

## 2023-11-05 ENCOUNTER — Other Ambulatory Visit: Payer: Self-pay

## 2023-11-05 MED ORDER — NITROGLYCERIN 0.4 MG SL SUBL: SUBLINGUAL_TABLET | SUBLINGUAL | 2 refills | Status: DC

## 2023-11-08 NOTE — Progress Notes (Unsigned)
Name: Sarah Duffy   MRN: [id number]    DOB: 04-22-1971   Date:11/09/2023       Progress Note  Subjective  Chief Complaint  Chief Complaint  Patient presents with   Back Pain    Lower, on/off since April. Worsening.    HPI  Discussed the use of AI scribe software for clinical note transcription with the patient, who gave verbal consent to proceed.  History of Present Illness   The patient, with a history of diabetes, heart disease, and chronic kidney disease, presents with chronic lower back pain that has been ongoing since April. The pain initially started at the lower right side of the back and then shifted to the center of the tailbone. The patient reports that the pain has now started to affect the entire left side, causing significant weakness in the left leg. The right leg is also starting to weaken due to compensatory pressure. The patient describes difficulty in climbing stairs and has to hold the rails tightly due to fear of falling.  The patient has been attending physical therapy for about four months without any significant improvement. The patient reports no tingling or numbness, and no issues with bowel or bladder function. The pain is reportedly alleviated when lying flat and is aggravated by sitting. Walking does not seem to exacerbate the pain unless there is a sudden shift in movement.  The patient has been taking naproxen for pain relief, but reports no significant improvement. The patient also has a history of taking cyclobenzaprine, a muscle relaxer, but has not had any since the last doctor's visit. The patient has not reported any side effects from these medications.  The patient's medical history includes diabetes, which is currently uncontrolled with an A1c of 8.4 in July, and chronic kidney disease stage 3A. The patient also has heart disease and is due for open heart surgery, but this has been postponed due to the ongoing back pain.  The patient underwent a CT  scan which showed significant arthritis in the spine. An MRI of the lumbar spine was also performed, but the results are pending. The patient saw Ortho once waiting for MRI results ,  she is here today seeking help / pain relief         Patient Active Problem List   Diagnosis Date Noted   Weakness of extremity 09/20/2023   Arthropathy of lumbar facet joint 07/29/2023   Low back pain 07/29/2023   Pain in finger of left hand 07/29/2023   Sacroiliac joint pain 07/29/2023   Contact dermatitis and eczema 06/08/2023   Dysphagia 03/23/2023   Asthma, well controlled 03/09/2023   Depression, major, in remission (HCC) 03/09/2023   Perennial allergic rhinitis with seasonal variation 03/09/2023   Type 2 diabetes mellitus with diabetic polyneuropathy, with long-term current use of insulin (HCC) 08/31/2022   Type 2 diabetes mellitus with retinopathy, with long-term current use of insulin (HCC) 08/31/2022   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(HCC) 08/31/2022   Hypertension 06/11/2022   Ischemic cardiomyopathy 06/11/2022   Coronary artery disease 06/11/2022   Chronic systolic heart failure (HCC) 06/11/2022   Angina pectoris associated with type 2 diabetes mellitus (HCC) 01/05/2022   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 edema associated with type 2 diabetes mellitus (HCC) 05/01/2021   Cardiomyopathy (HCC) 04/14/2021   Depression, major, recurrent, mild (HCC) 09/25/2019   History of 2019 novel coronavirus disease (COVID-19) 06/23/2019   Migraine headache without aura 01/14/2018   Type II diabetes mellitus (  HCC) 05/22/2017   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 associated with type 2 diabetes mellitus (HCC) 05/21/2016   Severe nonproliferative diabetic retinopathy of right eye without macular edema associated with type 2 diabetes mellitus (HCC) 05/21/2016   Hyperlipidemia LDL goal <70 03/10/2016   MI (mitral incompetence) 02/25/2016    Insomnia 02/25/2016   Anxiety 09/04/2014    Past Surgical History:  Procedure Laterality Date   CHOLECYSTECTOMY  1999   COLONOSCOPY WITH PROPOFOL N/A 03/25/2021   Procedure: COLONOSCOPY WITH PROPOFOL;  Surgeon: [name], MD;  Location: [facility] ENDOSCOPY;  Service: Gastroenterology;  Laterality: N/A;   COLONOSCOPY WITH PROPOFOL N/A 04/17/2021   Procedure: COLONOSCOPY WITH PROPOFOL;  Surgeon: [name], MD;  Location: [facility] ENDOSCOPY;  Service: Gastroenterology;  Laterality: N/A;   ESOPHAGOGASTRODUODENOSCOPY (EGD) WITH PROPOFOL N/A 03/25/2021   Procedure: ESOPHAGOGASTRODUODENOSCOPY (EGD) WITH PROPOFOL;  Surgeon: [name], MD;  Location: [facility] ENDOSCOPY;  Service: Gastroenterology;  Laterality: N/A;   ESOPHAGOGASTRODUODENOSCOPY (EGD) WITH PROPOFOL N/A 04/17/2021   Procedure: ESOPHAGOGASTRODUODENOSCOPY (EGD) WITH PROPOFOL;  Surgeon: [name], MD;  Location: [facility] ENDOSCOPY;  Service: Gastroenterology;  Laterality: N/A;   ESOPHAGOGASTRODUODENOSCOPY (EGD) WITH PROPOFOL N/A 03/23/2023   Procedure: ESOPHAGOGASTRODUODENOSCOPY (EGD) WITH PROPOFOL;  Surgeon: [name], MD;  Location: [facility] ENDOSCOPY;  Service: Gastroenterology;  Laterality: N/A;   RIGHT/LEFT HEART CATH AND CORONARY ANGIOGRAPHY Bilateral 04/14/2021   Procedure: RIGHT/LEFT HEART CATH AND CORONARY ANGIOGRAPHY;  Surgeon: Yvonne Kendall, MD;  Location: [facility] INVASIVE CV LAB;  Service: Cardiovascular;  Laterality: Bilateral;   TOE SURGERY Left 02/07/2016   Pinky Toe    Family History  Problem Relation Age of Onset   Diabetes Mother    Ulcers Mother    Heart disease Father    AAA (abdominal aortic aneurysm) Father    Diabetes Father    Hypertension Father    Stroke Father    Alzheimer's disease Father    Heart attack Sister    Seizures Brother    Diabetes Maternal Grandmother    Breast cancer Maternal Grandmother     Social History   Tobacco Use   Smoking status: Never   Smokeless tobacco: Never  Substance Use Topics    Alcohol use: No    Alcohol/week: 0.0 standard drinks of alcohol     Current Outpatient Medications:    albuterol (VENTOLIN HFA) 108 (90 Base) MCG/ACT inhaler, INHALE 2 PUFFS INTO THE LUNGS EVERY 4 HOURS AS NEEDED FOR WHEEZING OR SHORTNESS OF BREATH., Disp: 18 each, Rfl: 1   amLODipine (NORVASC) 5 MG tablet, Take 1 tablet (5 mg total) by mouth daily., Disp: 90 tablet, Rfl: 1   atorvastatin (LIPITOR) 80 MG tablet, Take 1 tablet (80 mg total) by mouth daily., Disp: 90 tablet, Rfl: 1   Blood Glucose Monitoring Suppl (ACCU-CHEK GUIDE) w/Device KIT, Use to check blood sugar 2 time daily as needed, Disp: 1 kit, Rfl: 0   carvedilol (COREG) 12.5 MG tablet, Take 1 tablet (12.5 mg total) by mouth 2 (two) times daily., Disp: 60 tablet, Rfl: 3   Continuous Glucose Sensor (DEXCOM G7 SENSOR) MISC, 1 DEVICE BY DOES NOT APPLY ROUTE AS DIRECTED., Disp: 3 each, Rfl: 11   cyclobenzaprine (FLEXERIL) 5 MG tablet, Take 1 tablet (5 mg total) by mouth 3 (three) times daily as needed for muscle spasms. 1 tablet every 8 hours as needed for muscle spasms, Disp: 60 tablet, Rfl: 0   ENTRESTO 24-26 MG, Take 1 tablet by mouth 2 (two) times daily., Disp: 60 tablet, Rfl:  2   ezetimibe (ZETIA) 10 MG tablet, Take 1 tablet (10 mg total) by mouth daily., Disp: 90 tablet, Rfl: 1   glucose blood (ACCU-CHEK GUIDE) test strip, Check blood sugar 2 times daily as needed, Disp: 100 each, Rfl: 12   insulin aspart (NOVOLOG FLEXPEN) 100 UNIT/ML FlexPen, Max daily 30 units, Disp: 30 mL, Rfl: 3   Insulin Glargine (BASAGLAR KWIKPEN) 100 UNIT/ML, Inject 70 Units into the skin daily., Disp: 60 mL, Rfl: 3   Insulin Pen Needle 32G X 4 MM MISC, 1 Device by Does not apply route in the morning, at noon, in the evening, and at bedtime., Disp: 400 each, Rfl: 3   JARDIANCE 25 MG TABS tablet, Take 1 tablet (25 mg total) by mouth daily., Disp: 90 tablet, Rfl: 2   lactulose (CHRONULAC) 10 GM/15ML solution, Take 30 mLs (20 g total) by mouth daily as needed for  mild constipation., Disp: 120 mL, Rfl: 0   Lancets (ACCU-CHEK MULTICLIX) lancets, Check sugar 2 times daily as needed., Disp: 100 each, Rfl: 12   levocetirizine (XYZAL) 5 MG tablet, Take 1 tablet (5 mg total) by mouth daily at 2 PM., Disp: 90 tablet, Rfl: 1   naproxen (NAPROSYN) 500 MG tablet, TAKE 1 TABLET BY MOUTH 2 TIMES DAILY WITH A MEAL., Disp: 180 tablet, Rfl: 0   nitroGLYCERIN (NITROSTAT) 0.4 MG SL tablet, PLACE 1 TABLET UNDER THE TONGUE EVERY 5 MINUTES AS NEEDED FOR CHEST PAIN., Disp: 25 tablet, Rfl: 2   prednisoLONE acetate (PRED FORTE) 1 % ophthalmic suspension, Place 1 drop into the left eye 4 (four) times daily., Disp: 10 mL, Rfl: 0   Rimegepant Sulfate (NURTEC) 75 MG TBDP, Take 1 tablet (75 mg total) by mouth daily as needed. Max of every other day prn, Disp: 16 tablet, Rfl: 0   spironolactone (ALDACTONE) 25 MG tablet, Take 1 tablet (25 mg total) by mouth daily., Disp: 90 tablet, Rfl: 0   triamcinolone cream (KENALOG) 0.1 %, Apply 1 Application topically 2 (two) times daily., Disp: 453.6 g, Rfl: 0  No Known Allergies  I personally reviewed active problem list, medication list, allergies, family history, social history, health maintenance with the patient/caregiver today.   ROS  Ten systems reviewed and is negative except as mentioned in HPI    Objective  Vitals:   11/09/23 0742  BP: 126/74  Pulse: 94  Resp: 16  SpO2: 99%  Weight: 171 lb (77.6 kg)  Height: 5\' 9"  (1.753 m)    Body mass index is 25.25 kg/m.  Physical Exam  Constitutional: Patient appears well-developed and well-nourished.  No distress.  HEENT: head atraumatic, normocephalic, pupils equal and reactive to light, neck supple Cardiovascular: Normal rate, regular rhythm and normal heart sounds.  No murmur heard. No BLE edema. Pulmonary/Chest: Effort normal and breath sounds normal. No respiratory distress. Muscular skeletal: no pain during palpation of spine, normal rom, normal sensation legs, negative  straight leg raise, patient states leg feels weak , normal gait Abdominal: Soft.  There is no tenderness. Psychiatric: Patient has a normal mood and affect. behavior is normal. Judgment and thought content normal.   PHQ2/9:    11/09/2023    7:42 AM 10/15/2023    2:32 PM 10/04/2023   10:24 AM 09/20/2023    9:34 AM 08/09/2023    8:08 AM  Depression screen PHQ 2/9  Decreased Interest 0 0 0 0 0  Down, Depressed, Hopeless 0 0 0 0 0  PHQ - 2 Score 0 0 0 0  0  Altered sleeping 0  0 0 0  Tired, decreased energy 0  0 0 0  Change in appetite 0  0 0 0  Feeling bad or failure about yourself  0  0 0 0  Trouble concentrating 0  0 0 0  Moving slowly or fidgety/restless 0  0 0 0  Suicidal thoughts 0  0 0 0  PHQ-9 Score 0  0 0 0  Difficult doing work/chores   Not difficult at all Not difficult at all     phq 9 is negative   Fall Risk:    11/09/2023    7:42 AM 10/15/2023    2:32 PM 10/04/2023   10:24 AM 09/20/2023    9:34 AM 08/09/2023    8:08 AM  Fall Risk   Falls in the past year? 0 0 0 0 0  Number falls in past yr: 0  0 0   Injury with Fall? 0  0 0   Risk for fall due to : No Fall Risks  No Fall Risks No Fall Risks No Fall Risks  Follow up Falls prevention discussed  Falls prevention discussed;Education provided;Falls evaluation completed Falls prevention discussed;Education provided;Falls evaluation completed Falls prevention discussed      Functional Status Survey: Is the patient deaf or have difficulty hearing?: No Does the patient have difficulty seeing, even when wearing glasses/contacts?: No Does the patient have difficulty concentrating, remembering, or making decisions?: No Does the patient have difficulty walking or climbing stairs?: Yes Does the patient have difficulty dressing or bathing?: No Does the patient have difficulty doing errands alone such as visiting a doctor's office or shopping?: No    Assessment & Plan  Assessment and Plan    Chronic Low Back Pain with  Left Sciatica Pain ongoing since April, with recent development of left leg weakness. MRI of lumbar spine pending. Physical therapy for 4 months without significant improvement. No bowel or bladder changes. Pain improved with rest and worsened with sitting. -Discontinue Naproxen due to chronic kidney disease stage 3A. -Start Pregabalin (Lyrica) 50mg  at night, increasing to twice daily after a few days, then three times daily as tolerated. -Attempt prior authorization for Skelaxin as a non-sedating muscle relaxant. If not approved, consider Flexeril. -Use Extra Strength Tylenol up to 3g daily for additional pain control. -Consider referral to a chiropractor or physiatrist depending on MRI results.  Type 2 Diabetes Last A1c in July was 8.4, indicating poor control. Next endocrinology appointment in December. -Monitor blood glucose levels closely, especially if starting corticosteroids for back pain.  Coronary Artery Disease Patient is due for CABG but is delaying due to back pain. No recent angina. -Continue current cardiac medications. -Optimize diabetes control prior to surgery to reduce risk of infection.  Chronic Kidney Disease Stage 3A Likely exacerbated by NSAID use. -Discontinue Naproxen. -Monitor kidney function closely.  Follow-up appointment scheduled for December 27th to assess response to new medications and discuss MRI results.

## 2023-11-09 ENCOUNTER — Ambulatory Visit (INDEPENDENT_AMBULATORY_CARE_PROVIDER_SITE_OTHER): Payer: Medicaid Other | Admitting: Family Medicine

## 2023-11-09 ENCOUNTER — Ambulatory Visit: Payer: Medicaid Other

## 2023-11-09 ENCOUNTER — Other Ambulatory Visit: Payer: Self-pay | Admitting: Family Medicine

## 2023-11-09 VITALS — BP 126/74 | HR 94 | Resp 16 | Ht 69.0 in | Wt 171.0 lb

## 2023-11-09 DIAGNOSIS — I2583 Coronary atherosclerosis due to lipid rich plaque: Secondary | ICD-10-CM | POA: Diagnosis not present

## 2023-11-09 DIAGNOSIS — G8929 Other chronic pain: Secondary | ICD-10-CM

## 2023-11-09 DIAGNOSIS — I2510 Atherosclerotic heart disease of native coronary artery without angina pectoris: Secondary | ICD-10-CM | POA: Diagnosis not present

## 2023-11-09 DIAGNOSIS — N1831 Chronic kidney disease, stage 3a: Secondary | ICD-10-CM | POA: Insufficient documentation

## 2023-11-09 DIAGNOSIS — Z0120 Encounter for dental examination and cleaning without abnormal findings: Secondary | ICD-10-CM | POA: Diagnosis not present

## 2023-11-09 DIAGNOSIS — E1159 Type 2 diabetes mellitus with other circulatory complications: Secondary | ICD-10-CM | POA: Diagnosis not present

## 2023-11-09 DIAGNOSIS — M5442 Lumbago with sciatica, left side: Secondary | ICD-10-CM

## 2023-11-09 MED ORDER — METAXALONE 800 MG PO TABS: 800.0000 mg | ORAL_TABLET | Freq: Three times a day (TID) | ORAL | 0 refills | Status: DC | PRN

## 2023-11-09 MED ORDER — PREGABALIN 50 MG PO CAPS: 50.0000 mg | ORAL_CAPSULE | Freq: Three times a day (TID) | ORAL | 0 refills | Status: DC

## 2023-11-09 NOTE — Telephone Encounter (Signed)
PA initiated

## 2023-11-10 ENCOUNTER — Telehealth: Payer: Self-pay | Admitting: Family Medicine

## 2023-11-10 NOTE — Progress Notes (Signed)
[facility] - Clinic Note  11/16/2023   CHIEF COMPLAINT Patient presents for Retina Follow Up  HISTORY OF PRESENT ILLNESS: Sarah Duffy is a 52 y.o. female who presents to the clinic today for:  HPI     Retina Follow Up   Patient presents with  Diabetic Retinopathy.  In both eyes.  This started 2 weeks ago.  I, the attending physician,  performed the HPI with the patient and updated documentation appropriately.        Comments   Patient here for 2 weeks retina follow up for PDR OU. Patient states vision is ok. Getting better since coming here. No eye pain.       Last edited by Rennis Chris, MD on 11/16/2023  4:30 PM.     Referring physician: [name], MD [address] [place],  [place] [address]  HISTORICAL INFORMATION:  Selected notes from the MEDICAL RECORD NUMBER Referred by Jacquelin Hawking, PA-C for diabetic eye exam LEE:  Ocular Hx- PDR OU, previously managed by [facility] and [facility]-   CURRENT MEDICATIONS: Current Outpatient Medications (Ophthalmic Drugs)  Medication Sig   prednisoLONE acetate (PRED FORTE) 1 % ophthalmic suspension Place 1 drop into the left eye 4 (four) times daily.   No current facility-administered medications for this visit. (Ophthalmic Drugs)   Current Outpatient Medications (Other)  Medication Sig   albuterol (VENTOLIN HFA) 108 (90 Base) MCG/ACT inhaler INHALE 2 PUFFS INTO THE LUNGS EVERY 4 HOURS AS NEEDED FOR WHEEZING OR SHORTNESS OF BREATH.   amLODipine (NORVASC) 5 MG tablet Take 1 tablet (5 mg total) by mouth daily.   atorvastatin (LIPITOR) 80 MG tablet Take 1 tablet (80 mg total) by mouth daily.   Blood Glucose Monitoring Suppl (ACCU-CHEK GUIDE) w/Device KIT Use to check blood sugar 2 time daily as needed   carvedilol (COREG) 12.5 MG tablet Take 1 tablet (12.5 mg total) by mouth 2 (two) times daily.   Continuous Glucose Sensor (DEXCOM G7 SENSOR) MISC 1 DEVICE BY DOES NOT APPLY ROUTE AS DIRECTED.    ENTRESTO 24-26 MG Take 1 tablet by mouth 2 (two) times daily.   ezetimibe (ZETIA) 10 MG tablet Take 1 tablet (10 mg total) by mouth daily.   glucose blood (ACCU-CHEK GUIDE) test strip Check blood sugar 2 times daily as needed   insulin aspart (NOVOLOG FLEXPEN) 100 UNIT/ML FlexPen Max daily 30 units   Insulin Glargine (BASAGLAR KWIKPEN) 100 UNIT/ML Inject 70 Units into the skin daily.   Insulin Pen Needle 32G X 4 MM MISC 1 Device by Does not apply route in the morning, at noon, in the evening, and at bedtime.   JARDIANCE 25 MG TABS tablet Take 1 tablet (25 mg total) by mouth daily.   lactulose (CHRONULAC) 10 GM/15ML solution Take 30 mLs (20 g total) by mouth daily as needed for mild constipation.   Lancets (ACCU-CHEK MULTICLIX) lancets Check sugar 2 times daily as needed.   levocetirizine (XYZAL) 5 MG tablet Take 1 tablet (5 mg total) by mouth daily at 2 PM.   metaxalone (SKELAXIN) 800 MG tablet TAKE 1 TABLET (800 MG TOTAL) BY MOUTH 3 (THREE) TIMES DAILY AS NEEDED FOR MUSCLE SPASMS.   nitroGLYCERIN (NITROSTAT) 0.4 MG SL tablet PLACE 1 TABLET UNDER THE TONGUE EVERY 5 MINUTES AS NEEDED FOR CHEST PAIN.   pregabalin (LYRICA) 50 MG capsule Take 1 capsule (50 mg total) by mouth 3 (three) times daily.   Rimegepant Sulfate (NURTEC) 75 MG TBDP  Take 1 tablet (75 mg total) by mouth daily as needed. Max of every other day prn   spironolactone (ALDACTONE) 25 MG tablet Take 1 tablet (25 mg total) by mouth daily.   triamcinolone cream (KENALOG) 0.1 % Apply 1 Application topically 2 (two) times daily.   No current facility-administered medications for this visit. (Other)   REVIEW OF SYSTEMS: ROS   Positive for: Endocrine, Cardiovascular, Eyes, Respiratory, Psychiatric Last edited by Laddie Aquas, COA on 11/16/2023  1:53 PM.     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  02/2021 Echo: EF 35-40%, glob HK, GrI DD, nl RV fxn, mildly dil LA, mod MR; b. 06/2022 Echo: EF 50-55%, no rwma, GrII DD, nl RV fxn, RVSP , mildly dil LA, mild MR, AoV sclerosis.   CKD (chronic kidney disease), stage III (HCC)    Complication of anesthesia 03/23/2023   Possible gastroparesis based off food in stomach during EGD   Coronary artery disease    a. 04/2021 Cath: LM nl, LAD 70p/m, 61m, 80d, LCX 32m, OM1 40, OM2 99 (fills via collats from OM1), RCA 60d, RPDA 75.   COVID-19 2021   Hyperlipidemia LDL goal <70    Hypertension    Ischemic cardiomyopathy    a. 02/2021 Echo: EF 35-40%, glob HK, GrI DD; b. 06/2022 Echo: EF 50-55%.   Moderate mitral regurgitation    a. 02/2021 Echo: Mod MR.   MRSA infection within last 3 months 02/25/2016   Osteomyelitis of foot (HCC) 08/26/2016   Type II diabetes mellitus (HCC)    Past Surgical History:  Procedure Laterality Date   CHOLECYSTECTOMY  1999   COLONOSCOPY WITH PROPOFOL N/A 03/25/2021   Procedure: COLONOSCOPY WITH PROPOFOL;  Surgeon: [name], MD;  Location: [facility] ENDOSCOPY;  Service: Gastroenterology;  Laterality: N/A;   COLONOSCOPY WITH PROPOFOL N/A 04/17/2021   Procedure: COLONOSCOPY WITH PROPOFOL;  Surgeon: [name], MD;  Location[facility] ENDOSCOPY;  Service: Gastroenterology;  Laterality: N/A;   ESOPHAGOGASTRODUODENOSCOPY (EGD) WITH PROPOFOL N/A 03/25/2021   Procedure: ESOPHAGOGASTRODUODENOSCOPY (EGD) WITH PROPOFOL;  Surgeon: [name], MD;  Location: [facility] ENDOSCOPY;  Service: Gastroenterology;  Laterality: N/A;   ESOPHAGOGASTRODUODENOSCOPY (EGD) WITH PROPOFOL N/A 04/17/2021   Procedure: ESOPHAGOGASTRODUODENOSCOPY (EGD) WITH PROPOFOL;  Surgeon: [name], MD;  Location[facility] ENDOSCOPY;  Service: Gastroenterology;  Laterality: N/A;   ESOPHAGOGASTRODUODENOSCOPY (EGD) WITH PROPOFOL N/A 03/23/2023   Procedure: ESOPHAGOGASTRODUODENOSCOPY (EGD) WITH PROPOFOL;  Surgeon: [name], MD;  Location: [facility] ENDOSCOPY;  Service: Gastroenterology;   Laterality: N/A;   RIGHT/LEFT HEART CATH AND CORONARY ANGIOGRAPHY Bilateral 04/14/2021   Procedure: RIGHT/LEFT HEART CATH AND CORONARY ANGIOGRAPHY;  Surgeon: Yvonne Kendall, MD;  Location: [facility] INVASIVE CV LAB;  Service: Cardiovascular;  Laterality: Bilateral;   TOE SURGERY Left 02/07/2016   Pinky Toe   FAMILY HISTORY Family History  Problem Relation Age of Onset   Diabetes Mother    Ulcers Mother    Heart disease Father    AAA (abdominal aortic aneurysm) Father    Diabetes Father    Hypertension Father    Stroke Father    Alzheimer's disease Father    Heart attack Sister    Seizures Brother    Diabetes Maternal Grandmother    Breast cancer Maternal Grandmother    SOCIAL HISTORY Social History   Tobacco Use   Smoking status: Never   Smokeless tobacco: Never  Vaping Use  Vaping status: Never Used  Substance Use Topics   Alcohol use: No    Alcohol/week: 0.0 standard drinks of alcohol   Drug use: No       OPHTHALMIC EXAM:  Base Eye Exam     Visual Acuity (Snellen - Linear)       Right Left   Dist So-Hi 20/50 20/60 -2   Dist ph  20/40 +2 20/40 +1         Tonometry (Tonopen, 1:50 PM)       Right Left   Pressure 15 12         Pupils       Dark Light Shape React APD   Right 3 2 Round Brisk None   Left 3 2 Round Brisk None         Visual Fields (Counting fingers)       Left Right    Full Full         Extraocular Movement       Right Left    Full, Ortho Full, Ortho         Neuro/Psych     Oriented x3: Yes   Mood/Affect: Normal         Dilation     Both eyes: 1.0% Mydriacyl, 2.5% Phenylephrine @ 1:50 PM           Slit Lamp and Fundus Exam     Slit Lamp Exam       Right Left   Lids/Lashes Normal Normal   Conjunctiva/Sclera mild melanosis mild melanosis   Cornea trace PEE 1-2+ Punctate epithelial erosions   Anterior Chamber deep and clear deep and clear   Iris Round and dilated, No NVI Round and dilated, No NVI   Lens  1+ Cortical cataract 1-2+ Cortical cataract, 1-2+ Posterior subcapsular cataract   Anterior Vitreous Vitreous syneresis, +RBCs, blood stained vitreous condensations -- slightly improved, focal fibrosis along temporal arcades Vitreous syneresis, +RBCs         Fundus Exam       Right Left   Disc Pink and Sharp, no NVD Pink and Sharp, no NVD   C/D Ratio 0.3 0.2   Macula Flat, good foveal reflex, scattered MA / DBH, +fibrosis, cystic changes / edema temporal mac Flat, Blunted foveal reflex, scattered MA, DBH and exudates   Vessels attenuated, Tortuous, +fibrosis along temporal arcades attenuated, Tortuous, +NVE   Periphery hazy view, grossly attached, scattered DBH, tractional fibrosis along IT arcades Attached, scattered MA / DBH, scattered PRP with good early fill in changes           IMAGING AND PROCEDURES  Imaging and Procedures for 11/16/2023  OCT, Retina - OU - Both Eyes       Right Eye Quality was good. Central Foveal Thickness: 266. Progression has improved. Findings include no SRF, abnormal foveal contour, intraretinal hyper-reflective material, epiretinal membrane, intraretinal fluid (+vitreous opacities -- slightly improved, scattered IRF / IRHM greatest temporal macula -- slightly improved, focal central PED, focal tractional edema along IT and ST arcades -- caught on widefield).   Left Eye Quality was good. Central Foveal Thickness: 265. Progression has worsened. Findings include normal foveal contour, no SRF, intraretinal hyper-reflective material, intraretinal fluid, vitreomacular adhesion (mild scattered cystic changes greatest nasal fovea and macula -- slightly increased).   Notes *Images captured and stored on drive  Diagnosis / Impression:  +DME OU OD: +vitreous opacities -- slightly improved, scattered IRF / IRHM greatest temporal macula -- slightly improved, focal  central PED, focal tractional edema along IT and ST arcades -- caught on widefield OS: mild  scattered cystic changes greatest nasal fovea and macula -- slightly increased  Clinical management:  See below  Abbreviations: NFP - Normal foveal profile. CME - cystoid macular edema. PED - pigment epithelial detachment. IRF - intraretinal fluid. SRF - subretinal fluid. EZ - ellipsoid zone. ERM - epiretinal membrane. ORA - outer retinal atrophy. ORT - outer retinal tubulation. SRHM - subretinal hyper-reflective material. IRHM - intraretinal hyper-reflective material      Intravitreal Injection, Pharmacologic Agent - OD - Right Eye       Time Out 11/16/2023. 2:31 PM. Confirmed correct patient, procedure, site, and patient consented.   Anesthesia Topical anesthesia was used. Anesthetic medications included Lidocaine 2%, Proparacaine 0.5%.   Procedure Preparation included 5% betadine to ocular surface, eyelid speculum. A (32g) needle was used.   Injection: 1.25 mg Bevacizumab 1.25mg /0.60ml   Route: Intravitreal, Site: Right Eye   NDC: P3213405, Lot: [id number], Expiration date: 12/19/2023   Post-op Post injection exam found visual acuity of at least counting fingers. The patient tolerated the procedure well. There were no complications. The patient received written and verbal post procedure care education. Post injection medications were not given.      Intravitreal Injection, Pharmacologic Agent - OS - Left Eye       Time Out 11/16/2023. 2:31 PM. Confirmed correct patient, procedure, site, and patient consented.   Anesthesia Topical anesthesia was used. Anesthetic medications included Lidocaine 4%, Proparacaine 0.5%.   Procedure Preparation included 5% betadine to ocular surface, eyelid speculum. A (32g) needle was used.   Injection: 1.25 mg Bevacizumab 1.25mg /0.59ml   Route: Intravitreal, Site: Left Eye   NDC: P3213405, Lot: [id number], Expiration date: 02/06/2024   Post-op Post injection exam found visual acuity of at least counting fingers. The patient tolerated the  procedure well. There were no complications. The patient received written and verbal post procedure care education. Post injection medications were not given.           ASSESSMENT/PLAN:   ICD-10-CM   1. 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 edema associated with type 2 diabetes mellitus (HCC)  E11.3513 OCT, Retina - OU - Both Eyes    Intravitreal Injection, Pharmacologic Agent - OD - Right Eye    Intravitreal Injection, Pharmacologic Agent - OS - Left Eye    Bevacizumab (AVASTIN) SOLN 1.25 mg    Bevacizumab (AVASTIN) SOLN 1.25 mg    2. Encounter for long-term (current) use of insulin (HCC)  Z79.4     3. Long term (current) use of oral hypoglycemic drugs  Z79.84     4. Vitreous hemorrhage, right eye (HCC)  H43.11     5. Essential hypertension  I10     6. Hypertensive retinopathy of both eyes  H35.033     7. 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 Proliferative diabetic retinopathy, both eyes  - previously managed by [facility] and [facility]  - h/o anti-VEGF therapy and PRP OS  - last A1c 8.4 on 07.08.24  - s/p IVA OU # 1 (11.05.24)  - s/p PRP OS 11.18.24 - exam shows blood stained vit condensations and tractional fibrosis OD -- slightly improved, scattered MA/DBH OU - FA (11.05.24) shows OD: Scattered punctate leaking NV, scattered blockage from [facility], scattered patches of vascular non-perfusion, leaking MA; OS: Scattered leaking NV -- greatest SN midzone, scattered patches of vascular  non-perfusion, leaking MA, scattered incomplete PRP - OCT shows OD: +vitreous opacities -- slightly improved, scattered IRF / IRHM greatest temporal macula -- slightly improved, focal central PED, focal tractional edema along IT and ST arcades -- caught on widefield; OS: mild scattered cystic changes greatest nasal fovea and macula -- slightly increased - recommend IVA OU #2 today 12.03.24 - pt wishes to proceed with laser - RBA of procedure  discussed, questions answered - informed consent obtained and signed - see procedure note - f/u next Tuesday -- DFE/OCT, PRP OD  5,6. Hypertensive retinopathy OU - discussed importance of tight BP control - monitor  7. Mixed Cataract OU - The symptoms of cataract, surgical options, and treatments and risks were discussed with patient. - discussed diagnosis and progression - monitor  Ophthalmic Meds Ordered this visit:  Meds ordered this encounter  Medications   Bevacizumab (AVASTIN) SOLN 1.25 mg   Bevacizumab (AVASTIN) SOLN 1.25 mg     Return in about 1 week (around 11/23/2023) for f/u PDR OU, DFE, OCT, PRP OD.  There are no Patient Instructions on file for this visit.  Explained the diagnoses, plan, and follow up with the patient and they expressed understanding.  Patient expressed understanding of the importance of proper follow up care.   This document serves as a record of services personally performed by Karie Chimera, MD, PhD. It was created on their behalf by Charlette Caffey, COT an ophthalmic technician. The creation of this record is the provider's dictation and/or activities during the visit.    Electronically signed by:  Charlette Caffey, COT  11/16/23 4:32 PM  This document serves as a record of services personally performed by Karie Chimera, MD, PhD. It was created on their behalf by Glee Arvin. Manson Passey, OA an ophthalmic technician. The creation of this record is the provider's dictation and/or activities during the visit.    Electronically signed by: Glee Arvin. Manson Passey, OA 11/16/23 4:32 PM  Karie Chimera, M.D., Ph.D. Diseases & Surgery of the Retina and Vitreous [facility] 11/16/2023  I have reviewed the above documentation for accuracy and completeness, and I agree with the above. Karie Chimera, M.D., Ph.D. 11/16/23 4:33 PM  Abbreviations: M myopia (nearsighted); A astigmatism; H hyperopia (farsighted); P presbyopia; Mrx  spectacle prescription;  CTL contact lenses; OD right eye; OS left eye; OU both eyes  XT exotropia; ET esotropia; PEK punctate epithelial keratitis; PEE punctate epithelial erosions; DES dry eye syndrome; MGD meibomian gland dysfunction; ATs artificial tears; PFAT's preservative free artificial tears; NSC nuclear sclerotic cataract; PSC posterior subcapsular cataract; ERM epi-retinal membrane; PVD posterior vitreous detachment; RD retinal detachment; DM diabetes mellitus; DR diabetic retinopathy; NPDR non-proliferative diabetic retinopathy; PDR proliferative diabetic retinopathy; CSME clinically significant macular edema; DME diabetic macular edema; dbh dot blot hemorrhages; CWS cotton wool spot; POAG primary open angle glaucoma; C/D cup-to-disc ratio; HVF humphrey visual field; GVF goldmann visual field; OCT optical coherence tomography; IOP intraocular pressure; BRVO Branch retinal vein occlusion; CRVO central retinal vein occlusion; CRAO central retinal artery occlusion; BRAO branch retinal artery occlusion; RT retinal tear; SB scleral buckle; PPV pars plana vitrectomy; VH Vitreous hemorrhage; PRP panretinal laser photocoagulation; IVK intravitreal kenalog; VMT vitreomacular traction; MH Macular hole;  NVD neovascularization of the disc; NVE neovascularization elsewhere; AREDS age related eye disease study; ARMD age related macular degeneration; POAG primary open angle glaucoma; EBMD epithelial/anterior basement membrane dystrophy; ACIOL anterior chamber intraocular lens; IOL intraocular lens; PCIOL posterior chamber  intraocular lens; Phaco/IOL phacoemulsification with intraocular lens placement; PRK photorefractive keratectomy; LASIK laser assisted in situ keratomileusis; HTN hypertension; DM diabetes mellitus; COPD chronic obstructive pulmonary disease

## 2023-11-10 NOTE — Telephone Encounter (Signed)
[place] from [facility], says PA was denied for  Metaxalone 800 mg , she is asking for PA to be sent again, so looks like she is asking for an appeal

## 2023-11-15 ENCOUNTER — Ambulatory Visit: Payer: Medicaid Other | Attending: Family Medicine

## 2023-11-15 ENCOUNTER — Telehealth: Payer: Self-pay

## 2023-11-15 DIAGNOSIS — M48061 Spinal stenosis, lumbar region without neurogenic claudication: Secondary | ICD-10-CM | POA: Diagnosis not present

## 2023-11-15 NOTE — Telephone Encounter (Signed)
PA has been done again

## 2023-11-15 NOTE — Telephone Encounter (Signed)
It will not pull up on my cover my meds. Did any of you did her?

## 2023-11-15 NOTE — Telephone Encounter (Signed)
Called patient about missed PT appointment. No answer but LVM on next appointment date/time and also reminder on attendance policy.

## 2023-11-16 ENCOUNTER — Encounter (INDEPENDENT_AMBULATORY_CARE_PROVIDER_SITE_OTHER): Payer: Self-pay | Admitting: Ophthalmology

## 2023-11-16 ENCOUNTER — Ambulatory Visit (INDEPENDENT_AMBULATORY_CARE_PROVIDER_SITE_OTHER): Payer: Medicaid Other | Admitting: Ophthalmology

## 2023-11-16 ENCOUNTER — Telehealth: Payer: Self-pay | Admitting: Family Medicine

## 2023-11-16 DIAGNOSIS — Z7984 Long term (current) use of oral hypoglycemic drugs: Secondary | ICD-10-CM | POA: Diagnosis not present

## 2023-11-16 DIAGNOSIS — H25813 Combined forms of age-related cataract, bilateral: Secondary | ICD-10-CM | POA: Diagnosis not present

## 2023-11-16 DIAGNOSIS — H4311 Vitreous hemorrhage, right eye: Secondary | ICD-10-CM | POA: Diagnosis not present

## 2023-11-16 DIAGNOSIS — I10 Essential (primary) hypertension: Secondary | ICD-10-CM | POA: Diagnosis not present

## 2023-11-16 DIAGNOSIS — H35033 Hypertensive retinopathy, bilateral: Secondary | ICD-10-CM | POA: Diagnosis not present

## 2023-11-16 DIAGNOSIS — E113513 Type 2 diabetes mellitus with proliferative diabetic retinopathy with macular edema, bilateral: Secondary | ICD-10-CM | POA: Diagnosis not present

## 2023-11-16 DIAGNOSIS — Z794 Long term (current) use of insulin: Secondary | ICD-10-CM | POA: Diagnosis not present

## 2023-11-16 MED ORDER — BEVACIZUMAB CHEMO INJECTION 1.25MG/0.05ML SYRINGE FOR KALEIDOSCOPE
1.2500 mg | INTRAVITREAL | Status: AC | PRN
  Administered 2023-11-16: 1.25 mg via INTRAVITREAL

## 2023-11-16 NOTE — Telephone Encounter (Signed)
Pt is calling in because she would like the results from her MRI. Please follow up with pt.

## 2023-11-17 ENCOUNTER — Ambulatory Visit: Payer: Medicaid Other

## 2023-11-17 ENCOUNTER — Telehealth: Payer: Self-pay

## 2023-11-17 NOTE — Progress Notes (Signed)
Referring Physician:  Providence Crosby, PA-C [address] [place],  [place] [address]  Primary Physician:  [name], MD  History of Present Illness: 11/22/2023 Sarah Duffy is here today with a chief complaint of bilateral hip weakness.  She states in April she started to have back pain that first extended into her right hip and is now into her left hip.  She feels as though it is due to sudden weakness with bilateral hip flexion.  She states she has to lift up her legs to get out of the bathtub or go up stairs.  She adds that her gait has been severely altered and she oftentimes stumbles.  She denies any numbness and tingling in her legs.  She denies any radiating pain down her legs.  She does feel as though at times her legs are heavy and has been given Lyrica and Skelaxin to help with her pain, but she adds is helped very minimally.  She denies any saddle anesthesia or incontinence of bowel or bladder.  She underwent physical therapy for 4 months with no improvement in her weakness.  The symptoms are causing a significant impact on the patient's life.   Review of Systems:  A 10 point review of systems is negative, except for the pertinent positives and negatives detailed in the HPI.  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 02/2021 Echo: EF 35-40%, glob HK, GrI DD, nl RV fxn, mildly dil LA, mod MR; b. 06/2022 Echo: EF 50-55%, no rwma, GrII DD, nl RV fxn, RVSP , mildly dil LA, mild MR, AoV sclerosis.   CKD (chronic kidney disease), stage III (HCC)    Complication of anesthesia 03/23/2023   Possible gastroparesis based off food in stomach during EGD   Coronary artery disease    a. 04/2021 Cath: LM nl, LAD 70p/m, 26m, 80d, LCX 19m, OM1 40, OM2 99 (fills via collats from OM1), RCA 60d, RPDA 75.   COVID-19 2021   Hyperlipidemia LDL goal <70    Hypertension    Ischemic  cardiomyopathy    a. 02/2021 Echo: EF 35-40%, glob HK, GrI DD; b. 06/2022 Echo: EF 50-55%.   Moderate mitral regurgitation    a. 02/2021 Echo: Mod MR.   MRSA infection within last 3 months 02/25/2016   Osteomyelitis of foot (HCC) 08/26/2016   Type II diabetes mellitus (HCC)     Past Surgical History: Past Surgical History:  Procedure Laterality Date   CHOLECYSTECTOMY  1999   COLONOSCOPY WITH PROPOFOL N/A 03/25/2021   Procedure: COLONOSCOPY WITH PROPOFOL;  Surgeon: [name], MD;  Location: [facility] ENDOSCOPY;  Service: Gastroenterology;  Laterality: N/A;   COLONOSCOPY WITH PROPOFOL N/A 04/17/2021   Procedure: COLONOSCOPY WITH PROPOFOL;  Surgeon: [name], MD;  Location: [facility] ENDOSCOPY;  Service: Gastroenterology;  Laterality: N/A;   ESOPHAGOGASTRODUODENOSCOPY (EGD) WITH PROPOFOL N/A 03/25/2021   Procedure: ESOPHAGOGASTRODUODENOSCOPY (EGD) WITH PROPOFOL;  Surgeon: [name], MD;  Location: [facility] ENDOSCOPY;  Service: Gastroenterology;  Laterality: N/A;   ESOPHAGOGASTRODUODENOSCOPY (EGD) WITH PROPOFOL N/A 04/17/2021   Procedure: ESOPHAGOGASTRODUODENOSCOPY (EGD) WITH PROPOFOL;  Surgeon: [name], MD;  Location: [facility] ENDOSCOPY;  Service: Gastroenterology;  Laterality: N/A;   ESOPHAGOGASTRODUODENOSCOPY (EGD) WITH PROPOFOL N/A 03/23/2023   Procedure: ESOPHAGOGASTRODUODENOSCOPY (EGD) WITH PROPOFOL;  Surgeon: [name], MD;  Location: [facility] ENDOSCOPY;  Service: Gastroenterology;  Laterality: N/A;   RIGHT/LEFT HEART CATH AND CORONARY ANGIOGRAPHY Bilateral 04/14/2021  Procedure: RIGHT/LEFT HEART CATH AND CORONARY ANGIOGRAPHY;  Surgeon: Yvonne Kendall, MD;  Location: [facility] INVASIVE CV LAB;  Service: Cardiovascular;  Laterality: Bilateral;   TOE SURGERY Left 02/07/2016   Pinky Toe    Allergies: Allergies as of 11/22/2023   (No Known Allergies)    Medications: Outpatient Encounter Medications as of 11/22/2023  Medication Sig   albuterol (VENTOLIN HFA) 108 (90 Base) MCG/ACT inhaler INHALE 2 PUFFS  INTO THE LUNGS EVERY 4 HOURS AS NEEDED FOR WHEEZING OR SHORTNESS OF BREATH.   amLODipine (NORVASC) 5 MG tablet Take 1 tablet (5 mg total) by mouth daily.   atorvastatin (LIPITOR) 80 MG tablet Take 1 tablet (80 mg total) by mouth daily.   Blood Glucose Monitoring Suppl (ACCU-CHEK GUIDE) w/Device KIT Use to check blood sugar 2 time daily as needed   carvedilol (COREG) 12.5 MG tablet Take 1 tablet (12.5 mg total) by mouth 2 (two) times daily.   Continuous Glucose Sensor (DEXCOM G7 SENSOR) MISC 1 DEVICE BY DOES NOT APPLY ROUTE AS DIRECTED.   ENTRESTO 24-26 MG Take 1 tablet by mouth 2 (two) times daily.   ezetimibe (ZETIA) 10 MG tablet Take 1 tablet (10 mg total) by mouth daily.   glucose blood (ACCU-CHEK GUIDE) test strip Check blood sugar 2 times daily as needed   insulin aspart (NOVOLOG FLEXPEN) 100 UNIT/ML FlexPen Max daily 30 units   Insulin Glargine (BASAGLAR KWIKPEN) 100 UNIT/ML Inject 70 Units into the skin daily.   Insulin Pen Needle 32G X 4 MM MISC 1 Device by Does not apply route in the morning, at noon, in the evening, and at bedtime.   JARDIANCE 25 MG TABS tablet Take 1 tablet (25 mg total) by mouth daily.   Lancets (ACCU-CHEK MULTICLIX) lancets Check sugar 2 times daily as needed.   levocetirizine (XYZAL) 5 MG tablet Take 1 tablet (5 mg total) by mouth daily at 2 PM.   metaxalone (SKELAXIN) 800 MG tablet TAKE 1 TABLET (800 MG TOTAL) BY MOUTH 3 (THREE) TIMES DAILY AS NEEDED FOR MUSCLE SPASMS.   OZEMPIC, 0.25 OR 0.5 MG/DOSE, 2 MG/3ML SOPN    prednisoLONE acetate (PRED FORTE) 1 % ophthalmic suspension Place 1 drop into the left eye 4 (four) times daily.   pregabalin (LYRICA) 50 MG capsule Take 1 capsule (50 mg total) by mouth 3 (three) times daily.   Rimegepant Sulfate (NURTEC) 75 MG TBDP Take 1 tablet (75 mg total) by mouth daily as needed. Max of every other day prn   spironolactone (ALDACTONE) 25 MG tablet Take 1 tablet (25 mg total) by mouth daily.   triamcinolone cream (KENALOG) 0.1 %  Apply 1 Application topically 2 (two) times daily.   nitroGLYCERIN (NITROSTAT) 0.4 MG SL tablet PLACE 1 TABLET UNDER THE TONGUE EVERY 5 MINUTES AS NEEDED FOR CHEST PAIN. (Patient not taking: Reported on 11/22/2023)   [DISCONTINUED] lactulose (CHRONULAC) 10 GM/15ML solution Take 30 mLs (20 g total) by mouth daily as needed for mild constipation.   No facility-administered encounter medications on file as of 11/22/2023.    Social History: Social History   Tobacco Use   Smoking status: Never   Smokeless tobacco: Never  Vaping Use   Vaping status: Never Used  Substance Use Topics   Alcohol use: No    Alcohol/week: 0.0 standard drinks of alcohol   Drug use: No    Family Medical History: Family History  Problem Relation Age of Onset   Diabetes Mother    Ulcers Mother    Heart  disease Father    AAA (abdominal aortic aneurysm) Father    Diabetes Father    Hypertension Father    Stroke Father    Alzheimer's disease Father    Heart attack Sister    Seizures Brother    Diabetes Maternal Grandmother    Breast cancer Maternal Grandmother     Physical Examination:   General: Patient is well developed, well nourished, calm, collected, and in no apparent distress. Attention to examination is appropriate.  Psychiatric: Patient is non-anxious.  ENT:  Oral mucosa appears well hydrated.  Neck:   Supple.  Full range of motion.  Respiratory: Patient is breathing without any difficulty.  Extremities: No edema.  Vascular: Palpable dorsal pedal pulses.  Skin:   On exposed skin, there are no abnormal skin lesions.  NEUROLOGICAL:     Awake, alert, oriented to person, place, and time.  Speech is clear and fluent. Fund of knowledge is appropriate.   Cranial Nerves: Pupils equal round and reactive to light.    Patient is tender to palpation of her lumbar paraspinals.  Strength:  Side Iliopsoas Quads Hamstring PF DF EHL  R 2 5 5 5 5 5   L 2 5 5 5 5 5    Reflexes are hyporeflexic  throughout.  Hoffman's is absent.  Clonus is not present.  Toes are down-going.  Bilateral upper and lower extremity sensation is intact to light touch.      Medical Decision Making  Imaging: MRI Lumbar Spine: IMPRESSION: 1. Multilevel degenerative changes of the lumbar spine as described above. 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 2. Moderate to severe spinal canal and bilateral lateral recess stenosis at L3-L4 and L5-S1.  I have personally reviewed the images and agree with the above interpretation.  Assessment and Plan: Sarah Duffy is a pleasant 52 y.o. female with chief complaint of bilateral hip weakness.  She states in April she started to have back pain that first extended into her right hip and is now into her left hip.  She feels as though it is due to sudden weakness with bilateral hip flexion.  She states she has to lift up her legs to get out of the bathtub or go up stairs.  She adds that her gait has been severely altered and she oftentimes stumbles.  She denies any numbness and tingling in her legs.  She denies any radiating pain down her legs.  She does feel as though at times her legs are heavy and has been given Lyrica and Skelaxin to help with her pain, but she adds is helped very minimally.  She denies any saddle anesthesia or incontinence of bowel or bladder.  She underwent physical therapy for 4 months with no improvement in her weakness.  On examination patient has 2/5 in bilateral hip flexion.  Imaging described as moderate to severe spinal canal and bilateral stenosis at L3-4 and L5-S1.  Concerned that patient has tremendous bilateral hip weakness.  This is in conjunction with back pain that is nonradiating with no associated numbness and tingling.  She is hyporreflexic on examination.  Area of weakness is above the level of the lumbar spine where we would expect potential weakness based on her MRI.  Her gait is also severely altered and the  majority of her pain comes from when she is attempting to ambulate.  Would like patient to see neurology urgently for evaluation and workup.  There is a concern for a myopathic process.  At this  time we will have patient undergo further x-rays, but no further interventions at this time until neurology workup is complete.  Thank you for involving me in the care of this patient.   Joan Flores, PA-C Dept. of Neurosurgery

## 2023-11-17 NOTE — Addendum Note (Signed)
Addended by: Jacquelin Hawking on: 11/17/2023 12:09 PM   Modules accepted: Orders

## 2023-11-17 NOTE — Telephone Encounter (Signed)
Called patient about missed PT appointment today. LVM to patient that I unfortunately need to discharge her from PT services due to > 2 consecutive no shows to appointments per our policy. Encouraged patient to return phone call to Thereasa Parkin if she had questions or wishes to continue PT.

## 2023-11-17 NOTE — Progress Notes (Signed)
Your MRI demonstrates degenerative changes of your lumbar spine along with severe stenosis of the spinal canal (this means the canal that holds the spinal cord has gotten smaller and is likely compressing your spinal cord and nerves). I have placed a referral to Neurosurgery services for you to be evaluated further and see if they can provide some management options.

## 2023-11-18 NOTE — Progress Notes (Signed)
[facility] - Clinic Note  11/23/2023   CHIEF COMPLAINT Patient presents for Retina Follow Up  HISTORY OF PRESENT ILLNESS: Sarah Duffy is a 52 y.o. female who presents to the clinic today for:  HPI     Retina Follow Up   Patient presents with  Diabetic Retinopathy.  In both eyes.  This started 1 week ago.  I, the attending physician,  performed the HPI with the patient and updated documentation appropriately.        Comments   Patient here for 1 weeks retina follow up for PDR OU/PRP OD. Patient states vision is good. Doing better. No eye pain.      Last edited by Rennis Chris, MD on 11/23/2023 11:53 AM.    Pt is here for [facility] OD  Referring physician: [name], MD [address] [place],  [place] [address]  HISTORICAL INFORMATION:  Selected notes from the MEDICAL RECORD NUMBER Referred by Jacquelin Hawking, PA-C for diabetic eye exam LEE:  Ocular Hx- PDR OU, previously managed by [facility] and [facility]-   CURRENT MEDICATIONS: Current Outpatient Medications (Ophthalmic Drugs)  Medication Sig   prednisoLONE acetate (PRED FORTE) 1 % ophthalmic suspension Place 1 drop into the left eye 4 (four) times daily.   No current facility-administered medications for this visit. (Ophthalmic Drugs)   Current Outpatient Medications (Other)  Medication Sig   albuterol (VENTOLIN HFA) 108 (90 Base) MCG/ACT inhaler INHALE 2 PUFFS INTO THE LUNGS EVERY 4 HOURS AS NEEDED FOR WHEEZING OR SHORTNESS OF BREATH.   amLODipine (NORVASC) 5 MG tablet Take 1 tablet (5 mg total) by mouth daily.   atorvastatin (LIPITOR) 80 MG tablet Take 1 tablet (80 mg total) by mouth daily.   Blood Glucose Monitoring Suppl (ACCU-CHEK GUIDE) w/Device KIT Use to check blood sugar 2 time daily as needed   carvedilol (COREG) 12.5 MG tablet Take 1 tablet (12.5 mg total) by mouth 2 (two) times daily.   Continuous Glucose Sensor (DEXCOM G7 SENSOR) MISC 1 DEVICE BY DOES NOT APPLY ROUTE AS  DIRECTED.   ENTRESTO 24-26 MG Take 1 tablet by mouth 2 (two) times daily.   ezetimibe (ZETIA) 10 MG tablet Take 1 tablet (10 mg total) by mouth daily.   glucose blood (ACCU-CHEK GUIDE) test strip Check blood sugar 2 times daily as needed   insulin aspart (NOVOLOG FLEXPEN) 100 UNIT/ML FlexPen Max daily 30 units   Insulin Glargine (BASAGLAR KWIKPEN) 100 UNIT/ML Inject 70 Units into the skin daily.   Insulin Pen Needle 32G X 4 MM MISC 1 Device by Does not apply route in the morning, at noon, in the evening, and at bedtime.   JARDIANCE 25 MG TABS tablet Take 1 tablet (25 mg total) by mouth daily.   Lancets (ACCU-CHEK MULTICLIX) lancets Check sugar 2 times daily as needed.   levocetirizine (XYZAL) 5 MG tablet Take 1 tablet (5 mg total) by mouth daily at 2 PM.   metaxalone (SKELAXIN) 800 MG tablet TAKE 1 TABLET (800 MG TOTAL) BY MOUTH 3 (THREE) TIMES DAILY AS NEEDED FOR MUSCLE SPASMS.   OZEMPIC, 0.25 OR 0.5 MG/DOSE, 2 MG/3ML SOPN    pregabalin (LYRICA) 50 MG capsule Take 1 capsule (50 mg total) by mouth 3 (three) times daily.   Rimegepant Sulfate (NURTEC) 75 MG TBDP Take 1 tablet (75 mg total) by mouth daily as needed. Max of every other day prn   spironolactone (ALDACTONE) 25 MG tablet Take 1 tablet (25 mg total)  by mouth daily.   triamcinolone cream (KENALOG) 0.1 % Apply 1 Application topically 2 (two) times daily.   nitroGLYCERIN (NITROSTAT) 0.4 MG SL tablet PLACE 1 TABLET UNDER THE TONGUE EVERY 5 MINUTES AS NEEDED FOR CHEST PAIN. (Patient not taking: Reported on 11/22/2023)   No current facility-administered medications for this visit. (Other)   REVIEW OF SYSTEMS: ROS   Positive for: Endocrine, Cardiovascular, Eyes, Respiratory, Psychiatric Last edited by Laddie Aquas, COA on 11/23/2023  9:42 AM.      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 02/2021 Echo: EF  35-40%, glob HK, GrI DD, nl RV fxn, mildly dil LA, mod MR; b. 06/2022 Echo: EF 50-55%, no rwma, GrII DD, nl RV fxn, RVSP , mildly dil LA, mild MR, AoV sclerosis.   CKD (chronic kidney disease), stage III (HCC)    Complication of anesthesia 03/23/2023   Possible gastroparesis based off food in stomach during EGD   Coronary artery disease    a. 04/2021 Cath: LM nl, LAD 70p/m, 63m, 80d, LCX 82m, OM1 40, OM2 99 (fills via collats from OM1), RCA 60d, RPDA 75.   COVID-19 2021   Hyperlipidemia LDL goal <70    Hypertension    Ischemic cardiomyopathy    a. 02/2021 Echo: EF 35-40%, glob HK, GrI DD; b. 06/2022 Echo: EF 50-55%.   Moderate mitral regurgitation    a. 02/2021 Echo: Mod MR.   MRSA infection within last 3 months 02/25/2016   Osteomyelitis of foot (HCC) 08/26/2016   Type II diabetes mellitus (HCC)    Past Surgical History:  Procedure Laterality Date   CHOLECYSTECTOMY  1999   COLONOSCOPY WITH PROPOFOL N/A 03/25/2021   Procedure: COLONOSCOPY WITH PROPOFOL;  Surgeon: [name], MD;  Location: [facility] ENDOSCOPY;  Service: Gastroenterology;  Laterality: N/A;   COLONOSCOPY WITH PROPOFOL N/A 04/17/2021   Procedure: COLONOSCOPY WITH PROPOFOL;  Surgeon: [name], MD;  Location: [facility] ENDOSCOPY;  Service: Gastroenterology;  Laterality: N/A;   ESOPHAGOGASTRODUODENOSCOPY (EGD) WITH PROPOFOL N/A 03/25/2021   Procedure: ESOPHAGOGASTRODUODENOSCOPY (EGD) WITH PROPOFOL;  Surgeon: [name], MD;  Location: [facility] ENDOSCOPY;  Service: Gastroenterology;  Laterality: N/A;   ESOPHAGOGASTRODUODENOSCOPY (EGD) WITH PROPOFOL N/A 04/17/2021   Procedure: ESOPHAGOGASTRODUODENOSCOPY (EGD) WITH PROPOFOL;  Surgeon: [name], MD;  Location: [facility] ENDOSCOPY;  Service: Gastroenterology;  Laterality: N/A;   ESOPHAGOGASTRODUODENOSCOPY (EGD) WITH PROPOFOL N/A 03/23/2023   Procedure: ESOPHAGOGASTRODUODENOSCOPY (EGD) WITH PROPOFOL;  Surgeon: [name], MD;  Location: [facility] ENDOSCOPY;  Service: Gastroenterology;  Laterality: N/A;    RIGHT/LEFT HEART CATH AND CORONARY ANGIOGRAPHY Bilateral 04/14/2021   Procedure: RIGHT/LEFT HEART CATH AND CORONARY ANGIOGRAPHY;  Surgeon: Yvonne Kendall, MD;  Location: [facility] INVASIVE CV LAB;  Service: Cardiovascular;  Laterality: Bilateral;   TOE SURGERY Left 02/07/2016   Pinky Toe   FAMILY HISTORY Family History  Problem Relation Age of Onset   Diabetes Mother    Ulcers Mother    Heart disease Father    AAA (abdominal aortic aneurysm) Father    Diabetes Father    Hypertension Father    Stroke Father    Alzheimer's disease Father    Heart attack Sister    Seizures Brother    Diabetes Maternal Grandmother    Breast cancer Maternal Grandmother    SOCIAL HISTORY Social History   Tobacco Use   Smoking status: Never   Smokeless tobacco: Never  Vaping Use  Vaping status: Never Used  Substance Use Topics   Alcohol use: No    Alcohol/week: 0.0 standard drinks of alcohol   Drug use: No       OPHTHALMIC EXAM:  Base Eye Exam     Visual Acuity (Snellen - Linear)       Right Left   Dist [place] 20/40 -2 20/100 +2   Dist ph [place] 20/30 -2 20/40 +2         Tonometry (Tonopen, 9:40 AM)       Right Left   Pressure 18 10         Pupils       Dark Light Shape React APD   Right 3 2 Round Brisk None   Left 3 2 Round Brisk None         Visual Fields (Counting fingers)       Left Right    Full Full         Extraocular Movement       Right Left    Full, Ortho Full, Ortho         Neuro/Psych     Oriented x3: Yes   Mood/Affect: Normal         Dilation     Both eyes: 1.0% Mydriacyl, 2.5% Phenylephrine @ 9:40 AM           Slit Lamp and Fundus Exam     Slit Lamp Exam       Right Left   Lids/Lashes Normal Normal   Conjunctiva/Sclera mild melanosis mild melanosis   Cornea trace PEE 1-2+ Punctate epithelial erosions   Anterior Chamber deep and clear deep and clear   Iris Round and dilated, No NVI Round and dilated, No NVI   Lens 1+ Cortical  cataract 1-2+ Cortical cataract, 1-2+ Posterior subcapsular cataract   Anterior Vitreous Vitreous syneresis, +RBCs, blood stained vitreous condensations -- slightly improved, focal fibrosis along temporal arcades Vitreous syneresis, +RBCs         Fundus Exam       Right Left   Disc Pink and Sharp, no NVD Pink and Sharp, no NVD   C/D Ratio 0.3 0.2   Macula Flat, good foveal reflex, scattered MA / DBH, +fibrosis, cystic changes / edema temporal mac Flat, Blunted foveal reflex, scattered MA, DBH and exudates   Vessels attenuated, Tortuous, +fibrosis along temporal arcades attenuated, Tortuous, +NVE   Periphery hazy view, grossly attached, scattered DBH, tractional fibrosis along IT arcades Attached, scattered MA / DBH, scattered PRP with good early fill in changes           IMAGING AND PROCEDURES  Imaging and Procedures for 11/23/2023  OCT, Retina - OU - Both Eyes       Right Eye Quality was good. Central Foveal Thickness: 254. Progression has improved. Findings include no SRF, abnormal foveal contour, intraretinal hyper-reflective material, epiretinal membrane, intraretinal fluid (+vitreous opacities -- slightly improved, scattered IRF / IRHM greatest temporal macula -- slightly improved, focal central PED, focal tractional edema along IT and ST arcades -- caught on widefield).   Left Eye Quality was good. Central Foveal Thickness: 261. Progression has been stable. Findings include normal foveal contour, no SRF, intraretinal hyper-reflective material, intraretinal fluid, vitreomacular adhesion (mild scattered cystic changes greatest nasal fovea and macula ).   Notes *Images captured and stored on drive  Diagnosis / Impression:  +DME OU OD: +vitreous opacities -- slightly improved, scattered IRF / IRHM greatest temporal macula -- slightly improved, focal  central PED, focal tractional edema along IT and ST arcades -- caught on widefield OS: mild scattered cystic changes greatest  nasal fovea and macula  Clinical management:  See below  Abbreviations: NFP - Normal foveal profile. CME - cystoid macular edema. PED - pigment epithelial detachment. IRF - intraretinal fluid. SRF - subretinal fluid. EZ - ellipsoid zone. ERM - epiretinal membrane. ORA - outer retinal atrophy. ORT - outer retinal tubulation. SRHM - subretinal hyper-reflective material. IRHM - intraretinal hyper-reflective material      Panretinal Photocoagulation - OD - Right Eye       LASER PROCEDURE NOTE  Diagnosis:   Proliferative Diabetic Retinopathy, RIGHT EYE  Procedure:  Pan-retinal photocoagulation using slit lamp laser, RIGHT EYE  Anesthesia:  Topical  Surgeon: Rennis Chris, MD, PhD   Informed consent obtained, operative eye marked, and time out performed prior to initiation of laser.   Lumenis [id number] slit lamp laser Pattern: 3x3 square Power: 360 mW Duration: 40 msec  Spot size: 200 microns  # spots: 1163 spots  Complications: None.  Notes: significant vitreous heme and cortical cataract obscuring view and preventing laser up take inferiorly and scattered focal/peripheral areas  RTC: 12.31.24 or later -- DFE/OCT, possible injxns  Patient tolerated the procedure well and received written and verbal post-procedure care information/education.           ASSESSMENT/PLAN:   ICD-10-CM   1. 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 Encounter for long-term (current) use of insulin (HCC)  Z79.4     3. Long term (current) use of oral hypoglycemic drugs  Z79.84     4. Vitreous hemorrhage, right eye (HCC)  H43.11     5. Essential hypertension  I10     6. Hypertensive retinopathy of both eyes  H35.033     7. Combined forms of age-related cataract of both eyes  H25.813        1-4. Proliferative diabetic  retinopathy, both eyes  - previously managed by [facility] and [facility]  - h/o anti-VEGF therapy and PRP OS  - last A1c 8.4 on 07.08.24  - s/p IVA OU #1 (11.05.24), #2 (12.03.24)  - s/p PRP OS 11.18.24 - exam shows blood stained vit condensations and tractional fibrosis OD -- slightly improved, scattered MA/DBH OU - FA (11.05.24) shows OD: Scattered punctate leaking NV, scattered blockage from [facility], scattered patches of vascular non-perfusion, leaking MA; OS: Scattered leaking NV -- greatest SN midzone, scattered patches of vascular non-perfusion, leaking MA, scattered incomplete PRP - OCT shows OD: +vitreous opacities -- slightly improved, scattered IRF / IRHM greatest temporal macula -- slightly improved, focal central PED, focal tractional edema along IT and ST arcades -- caught on widefield; OS: mild scattered cystic changes greatest nasal fovea and macula - recommend PRP OD today 12.10.24 - pt wishes to proceed with laser - RBA of procedure discussed, questions answered - informed consent obtained and signed - see procedure note - start PF QID OD x7 days - f/u 3 weeks (12.31.24) -- DFE/OCT, possible IVA OU  5,6. Hypertensive retinopathy OU - discussed importance of tight BP control - monitor  7. Mixed Cataract OU - The symptoms of cataract, surgical options, and treatments and risks were discussed with patient. - discussed diagnosis and progression - monitor  Ophthalmic Meds Ordered this visit:  No orders of the defined types were placed in this encounter.    Return in about 3 weeks (around 12/14/2023) for f/u PDR OU, DFE, OCT.  There are no Patient Instructions on file for this visit.  Explained the diagnoses, plan, and follow up with the patient and they expressed understanding.  Patient expressed understanding of the importance of proper follow up care.   This document serves as a record of services personally performed by Karie Chimera, MD, PhD. It was created on their  behalf by Charlette Caffey, COT an ophthalmic technician. The creation of this record is the provider's dictation and/or activities during the visit.    Electronically signed by:  Charlette Caffey, COT  11/23/23 12:16 PM  This document serves as a record of services personally performed by Karie Chimera, MD, PhD. It was created on their behalf by Glee Arvin. Manson Passey, OA an ophthalmic technician. The creation of this record is the provider's dictation and/or activities during the visit.    Electronically signed by: Glee Arvin. Manson Passey, OA 11/23/23 12:16 PM  Karie Chimera, M.D., Ph.D. Diseases & Surgery of the Retina and Vitreous [facility] 11/23/2023  I have reviewed the above documentation for accuracy and completeness, and I agree with the above. Karie Chimera, M.D., Ph.D. 11/23/23 12:17 PM   Abbreviations: M myopia (nearsighted); A astigmatism; H hyperopia (farsighted); P presbyopia; Mrx spectacle prescription;  CTL contact lenses; OD right eye; OS left eye; OU both eyes  XT exotropia; ET esotropia; PEK punctate epithelial keratitis; PEE punctate epithelial erosions; DES dry eye syndrome; MGD meibomian gland dysfunction; ATs artificial tears; PFAT's preservative free artificial tears; NSC nuclear sclerotic cataract; PSC posterior subcapsular cataract; ERM epi-retinal membrane; PVD posterior vitreous detachment; RD retinal detachment; DM diabetes mellitus; DR diabetic retinopathy; NPDR non-proliferative diabetic retinopathy; PDR proliferative diabetic retinopathy; CSME clinically significant macular edema; DME diabetic macular edema; dbh dot blot hemorrhages; CWS cotton wool spot; POAG primary open angle glaucoma; C/D cup-to-disc ratio; HVF humphrey visual field; GVF goldmann visual field; OCT optical coherence tomography; IOP intraocular pressure; BRVO Branch retinal vein occlusion; CRVO central retinal vein occlusion; CRAO central retinal artery occlusion; BRAO branch  retinal artery occlusion; RT retinal tear; SB scleral buckle; PPV pars plana vitrectomy; VH Vitreous hemorrhage; PRP panretinal laser photocoagulation; IVK intravitreal kenalog; VMT vitreomacular traction; MH Macular hole;  NVD neovascularization of the disc; NVE neovascularization elsewhere; AREDS age related eye disease study; ARMD age related macular degeneration; POAG primary open angle glaucoma; EBMD epithelial/anterior basement membrane dystrophy; ACIOL anterior chamber intraocular lens; IOL intraocular lens; PCIOL posterior chamber intraocular lens; Phaco/IOL phacoemulsification with intraocular lens placement; PRK photorefractive keratectomy; LASIK laser assisted in situ keratomileusis; HTN hypertension; DM diabetes mellitus; COPD chronic obstructive pulmonary disease

## 2023-11-22 ENCOUNTER — Ambulatory Visit
Admission: RE | Admit: 2023-11-22 | Discharge: 2023-11-22 | Disposition: A | Discharge status: Home / Self Care | Payer: Medicaid Other | Attending: Physician Assistant | Admitting: Physician Assistant

## 2023-11-22 ENCOUNTER — Ambulatory Visit
Admission: RE | Admit: 2023-11-22 | Discharge: 2023-11-22 | Disposition: A | Discharge status: Home / Self Care | Payer: Medicaid Other | Source: Ambulatory Visit | Attending: Physician Assistant | Admitting: Physician Assistant

## 2023-11-22 ENCOUNTER — Ambulatory Visit: Payer: Medicaid Other | Admitting: Physician Assistant

## 2023-11-22 ENCOUNTER — Encounter: Payer: Self-pay | Admitting: Physician Assistant

## 2023-11-22 VITALS — BP 126/74 | Ht 69.0 in | Wt 169.0 lb

## 2023-11-22 DIAGNOSIS — R29898 Other symptoms and signs involving the musculoskeletal system: Secondary | ICD-10-CM | POA: Insufficient documentation

## 2023-11-22 DIAGNOSIS — M438X6 Other specified deforming dorsopathies, lumbar region: Secondary | ICD-10-CM | POA: Diagnosis not present

## 2023-11-22 DIAGNOSIS — R2681 Unsteadiness on feet: Secondary | ICD-10-CM

## 2023-11-22 DIAGNOSIS — M545 Low back pain, unspecified: Secondary | ICD-10-CM | POA: Insufficient documentation

## 2023-11-22 DIAGNOSIS — M48061 Spinal stenosis, lumbar region without neurogenic claudication: Secondary | ICD-10-CM

## 2023-11-22 DIAGNOSIS — R531 Weakness: Secondary | ICD-10-CM | POA: Diagnosis not present

## 2023-11-22 DIAGNOSIS — M549 Dorsalgia, unspecified: Secondary | ICD-10-CM | POA: Diagnosis not present

## 2023-11-22 DIAGNOSIS — M4185 Other forms of scoliosis, thoracolumbar region: Secondary | ICD-10-CM | POA: Diagnosis not present

## 2023-11-22 DIAGNOSIS — M438X4 Other specified deforming dorsopathies, thoracic region: Secondary | ICD-10-CM | POA: Diagnosis not present

## 2023-11-22 NOTE — Progress Notes (Signed)
Name: Sarah Duffy   MRN: [id number]    DOB: [birth year]   Date:12/10/2023       Progress Note  Subjective  Chief Complaint  Chief Complaint  Patient presents with   Medical Management of Chronic Issues    HPI  Discussed the use of AI scribe software for clinical note transcription with the patient, who gave verbal consent to proceed.  History of Present Illness   The patient, with a history of coronary artery disease, chronic systolic heart failure, cardiomyopathy, and diabetes, presents with ongoing back pain. The back pain, which began in November, has not improved despite physical therapy. The patient has undergone a series of diagnostic tests, including a scoliosis x-ray, lumbar spine x-ray, CT of the lumbar spine, and an MRI of the lumbar spine. The MRI revealed multilevel arthritis and severe spinal canal stenosis at L4, L5, with moderate to severe spinal canal and bilateral lateral recessive stenosis at L3, L4, L5, S1.  The patient has seen multiple specialists for the back pain, including a neurologist and an orthopedic doctor, and is currently under the care of a neurosurgeon. However, the patient's high blood sugar levels have complicated treatment options, as she preclude the possibility of procedures, surgery, or cortisone injections.  In addition to the back pain, the patient has been managing her diabetes with insulin, Jardiance, and Ozempic. However, recent lab results showed an A1c level of 12.4, indicating poor control of the diabetes. The patient attributes the increase in A1c to stress related to her back pain and work situation.  The patient also has retinopathy, for which she is receiving laser treatment, and protein in the urine due to diabetes. She has been anemic for a long time, possibly due to chronic kidney disease or uncontrolled diabetes but we will check labs to rule out iron deficiency anemia.  The patient's heart condition has been stable, with no reported  chest pain or shortness of breath. She is on multiple medications for her heart condition, including amlodipine, carvedilol, atorvastatin, ezetimibe, Jardiance, nitroglycerin, and spironolactone, all of which she reports taking daily without side effects.  For the back pain, the patient is taking pregabalin (Lyrica) three times a day, naproxen, and a muscle relaxer. She also has a history of migraines, which have not occurred recently, and she has Nurtec on hand for when they do occur.  The patient has been experiencing a runny nose for a couple of weeks, which she has been managing with over-the-counter medications. She also reports feeling stressed due to her back pain and work situation.         Patient Active Problem List   Diagnosis Date Noted   Gait abnormality 11/30/2023   Spinal stenosis of lumbar region 11/30/2023   Chronic kidney disease, stage 3a (HCC) 11/09/2023   Weakness of extremity 09/20/2023   Arthropathy of lumbar facet joint 07/29/2023   Low back pain 07/29/2023   Pain in finger of left hand 07/29/2023   Sacroiliac joint pain 07/29/2023   Contact dermatitis and eczema 06/08/2023   Dysphagia 03/23/2023   Asthma, well controlled 03/09/2023   Depression, major, in remission (HCC) 03/09/2023   Perennial allergic rhinitis with seasonal variation 03/09/2023   Type 2 diabetes mellitus with diabetic polyneuropathy, with long-term current use of insulin (HCC) 08/31/2022   Type 2 diabetes mellitus with retinopathy, with long-term current use of insulin (HCC) 08/31/2022   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(HCC) 08/31/2022   Hypertension 06/11/2022   Ischemic cardiomyopathy 06/11/2022   Coronary  artery disease 06/11/2022   Chronic systolic heart failure (HCC) 06/11/2022   Angina pectoris associated with type 2 diabetes mellitus (HCC) 01/05/2022   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 edema associated with type 2 diabetes mellitus (HCC) 05/01/2021    Cardiomyopathy (HCC) 04/14/2021   History of 2019 novel coronavirus disease (COVID-19) 06/23/2019   Chronic migraine w/o aura w/o status migrainosus, not intractable 01/14/2018   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 associated with type 2 diabetes mellitus (HCC) 05/21/2016   Hyperlipidemia LDL goal <70 03/10/2016   MI (mitral incompetence) 02/25/2016   Insomnia 02/25/2016   Anxiety 09/04/2014    Past Surgical History:  Procedure Laterality Date   CHOLECYSTECTOMY  1999   COLONOSCOPY WITH PROPOFOL N/A 03/25/2021   Procedure: COLONOSCOPY WITH PROPOFOL;  Surgeon: [name], MD;  Location: [facility] ENDOSCOPY;  Service: Gastroenterology;  Laterality: N/A;   COLONOSCOPY WITH PROPOFOL N/A 04/17/2021   Procedure: COLONOSCOPY WITH PROPOFOL;  Surgeon: [name], MD;  Location: [facility] ENDOSCOPY;  Service: Gastroenterology;  Laterality: N/A;   ESOPHAGOGASTRODUODENOSCOPY (EGD) WITH PROPOFOL N/A 03/25/2021   Procedure: ESOPHAGOGASTRODUODENOSCOPY (EGD) WITH PROPOFOL;  Surgeon: [name], MD;  Location: [facility] ENDOSCOPY;  Service: Gastroenterology;  Laterality: N/A;   ESOPHAGOGASTRODUODENOSCOPY (EGD) WITH PROPOFOL N/A 04/17/2021   Procedure: ESOPHAGOGASTRODUODENOSCOPY (EGD) WITH PROPOFOL;  Surgeon: [name], MD;  Location: [facility] ENDOSCOPY;  Service: Gastroenterology;  Laterality: N/A;   ESOPHAGOGASTRODUODENOSCOPY (EGD) WITH PROPOFOL N/A 03/23/2023   Procedure: ESOPHAGOGASTRODUODENOSCOPY (EGD) WITH PROPOFOL;  Surgeon: [name], MD;  Location: [facility] ENDOSCOPY;  Service: Gastroenterology;  Laterality: N/A;   RIGHT/LEFT HEART CATH AND CORONARY ANGIOGRAPHY Bilateral 04/14/2021   Procedure: RIGHT/LEFT HEART CATH AND CORONARY ANGIOGRAPHY;  Surgeon: Yvonne Kendall, MD;  Location: [facility] INVASIVE CV LAB;  Service: Cardiovascular;  Laterality: Bilateral;   TOE SURGERY Left 02/07/2016   Pinky Toe    Family History  Problem Relation Age of Onset   Diabetes Mother    Ulcers Mother     Heart disease Father    AAA (abdominal aortic aneurysm) Father    Diabetes Father    Hypertension Father    Stroke Father    Alzheimer's disease Father    Heart attack Sister    Seizures Brother    Diabetes Maternal Grandmother    Breast cancer Maternal Grandmother     Social History   Tobacco Use   Smoking status: Never   Smokeless tobacco: Never  Substance Use Topics   Alcohol use: No    Alcohol/week: 0.0 standard drinks of alcohol     Current Outpatient Medications:    albuterol (VENTOLIN HFA) 108 (90 Base) MCG/ACT inhaler, INHALE 2 PUFFS INTO THE LUNGS EVERY 4 HOURS AS NEEDED FOR WHEEZING OR SHORTNESS OF BREATH., Disp: 18 each, Rfl: 1   amLODipine (NORVASC) 5 MG tablet, Take 1 tablet (5 mg total) by mouth daily., Disp: 90 tablet, Rfl: 1   atorvastatin (LIPITOR) 80 MG tablet, Take 1 tablet (80 mg total) by mouth daily., Disp: 90 tablet, Rfl: 1   Blood Glucose Monitoring Suppl (ACCU-CHEK GUIDE) w/Device KIT, Use to check blood sugar 2 time daily as needed, Disp: 1 kit, Rfl: 0   carvedilol (COREG) 12.5 MG tablet, TAKE 1 TABLET BY MOUTH 2 TIMES DAILY., Disp: 180 tablet, Rfl: 0   Continuous Glucose Sensor (DEXCOM G7 SENSOR) MISC, 1 DEVICE BY DOES NOT APPLY ROUTE AS DIRECTED., Disp: 3 each, Rfl: 11   ENTRESTO 24-26 MG, Take 1 tablet by mouth 2 (two) times daily.,  Disp: 60 tablet, Rfl: 3   ezetimibe (ZETIA) 10 MG tablet, Take 1 tablet (10 mg total) by mouth daily., Disp: 90 tablet, Rfl: 1   glucose blood (ACCU-CHEK GUIDE) test strip, Check blood sugar 2 times daily as needed, Disp: 100 each, Rfl: 12   insulin aspart (NOVOLOG FLEXPEN) 100 UNIT/ML FlexPen, Max daily 30 units, Disp: 30 mL, Rfl: 3   Insulin Glargine (BASAGLAR KWIKPEN) 100 UNIT/ML, Inject 70 Units into the skin daily., Disp: 60 mL, Rfl: 3   Insulin Pen Needle 32G X 4 MM MISC, 1 Device by Does not apply route in the morning, at noon, in the evening, and at bedtime., Disp: 400 each, Rfl: 3   JARDIANCE 25 MG TABS tablet,  Take 1 tablet (25 mg total) by mouth daily., Disp: 90 tablet, Rfl: 2   Lancets (ACCU-CHEK MULTICLIX) lancets, Check sugar 2 times daily as needed., Disp: 100 each, Rfl: 12   levocetirizine (XYZAL) 5 MG tablet, Take 1 tablet (5 mg total) by mouth daily at 2 PM., Disp: 90 tablet, Rfl: 1   metaxalone (SKELAXIN) 800 MG tablet, TAKE 1 TABLET (800 MG TOTAL) BY MOUTH 3 (THREE) TIMES DAILY AS NEEDED FOR MUSCLE SPASMS., Disp: 90 tablet, Rfl: 0   naproxen (NAPROSYN) 500 MG tablet, , Disp: , Rfl:    nitroGLYCERIN (NITROSTAT) 0.4 MG SL tablet, PLACE 1 TABLET UNDER THE TONGUE EVERY 5 MINUTES AS NEEDED FOR CHEST PAIN., Disp: 25 tablet, Rfl: 2   OZEMPIC, 0.25 OR 0.5 MG/DOSE, 2 MG/3ML SOPN, , Disp: , Rfl:    prednisoLONE acetate (PRED FORTE) 1 % ophthalmic suspension, Place 1 drop into the left eye 4 (four) times daily., Disp: 10 mL, Rfl: 0   pregabalin (LYRICA) 50 MG capsule, Take 1 capsule (50 mg total) by mouth 3 (three) times daily., Disp: 90 capsule, Rfl: 0   Rimegepant Sulfate (NURTEC) 75 MG TBDP, Take 1 tablet (75 mg total) by mouth daily as needed. Max of every other day prn, Disp: 16 tablet, Rfl: 0   spironolactone (ALDACTONE) 25 MG tablet, Take 1 tablet (25 mg total) by mouth daily., Disp: 90 tablet, Rfl: 0   triamcinolone cream (KENALOG) 0.1 %, Apply 1 Application topically 2 (two) times daily., Disp: 453.6 g, Rfl: 0  No Known Allergies  I personally reviewed active problem list, medication list, allergies, family history with the patient/caregiver today.   ROS Ten systems reviewed and is negative except as mentioned in HPI    Objective  Vitals:   12/10/23 1326  BP: 114/74  Pulse: 88  Resp: 16  Temp: 97.8 F (36.6 C)  TempSrc: Oral  SpO2: 100%  Weight: 172 lb 3.2 oz (78.1 kg)  Height: 5\' 9"  (1.753 m)    Body mass index is 25.43 kg/m.  Physical Exam  Constitutional: Patient appears well-developed and well-nourished.  No distress.  HEENT: head atraumatic, normocephalic, pupils  equal and reactive to light, neck supple Cardiovascular: Normal rate, regular rhythm and normal heart sounds.  No murmur heard. No BLE edema. Pulmonary/Chest: Effort normal and breath sounds normal. No respiratory distress. Abdominal: Soft.  There is no tenderness. Psychiatric: Patient has a normal mood and affect. behavior is normal. Judgment and thought content normal.   Recent Results (from the past 2160 hours)  Direct LDL     Status: None   Collection Time: 11/02/23  9:53 AM  Result Value Ref Range   LDL Direct 72 0 - 99 mg/dL  Basic Metabolic Panel (BMET)     Status:  Abnormal   Collection Time: 11/02/23  9:53 AM  Result Value Ref Range   Glucose 338 (H) 70 - 99 mg/dL   BUN 23 6 - 24 mg/dL   Creatinine, Ser 5.57 (H) 0.57 - 1.00 mg/dL   eGFR 54 (L) >32 KG/URK/2.70   BUN/Creatinine Ratio 19 9 - 23   Sodium 138 134 - 144 mmol/L   Potassium 5.0 3.5 - 5.2 mmol/L   Chloride 104 96 - 106 mmol/L   CO2 20 20 - 29 mmol/L   Calcium 8.9 8.7 - 10.2 mg/dL  CBC     Status: Abnormal   Collection Time: 11/02/23  9:53 AM  Result Value Ref Range   WBC 8.2 3.4 - 10.8 x10E3/uL    Comment: **Effective November 15, 2023 profile [id number] WBC will be made**   non-orderable as a stand-alone order code.    RBC 3.72 (L) 3.77 - 5.28 x10E6/uL   Hemoglobin 10.6 (L) 11.1 - 15.9 g/dL   Hematocrit 83.1 51.7 - 46.6 %   MCV 93 79 - 97 fL   MCH 28.5 26.6 - 33.0 pg   MCHC 30.7 (L) 31.5 - 35.7 g/dL   RDW 61.6 07.3 - 71.0 %   Platelets 289 150 - 450 x10E3/uL  POCT HgB A1C     Status: Abnormal   Collection Time: 12/10/23  1:34 PM  Result Value Ref Range   Hemoglobin A1C 12.4 (A) 4.0 - 5.6 %   HbA1c POC (<> result, manual entry)     HbA1c, POC (prediabetic range)     HbA1c, POC (controlled diabetic range)        PHQ2/9:    12/10/2023    1:26 PM 11/09/2023    7:42 AM 10/15/2023    2:32 PM 10/04/2023   10:24 AM 09/20/2023    9:34 AM  Depression screen PHQ 2/9  Decreased Interest 0 0 0 0 0  Down,  Depressed, Hopeless 0 0 0 0 0  PHQ - 2 Score 0 0 0 0 0  Altered sleeping 0 0  0 0  Tired, decreased energy 0 0  0 0  Change in appetite 0 0  0 0  Feeling bad or failure about yourself  0 0  0 0  Trouble concentrating 0 0  0 0  Moving slowly or fidgety/restless 0 0  0 0  Suicidal thoughts 0 0  0 0  PHQ-9 Score 0 0  0 0  Difficult doing work/chores    Not difficult at all Not difficult at all    phq 9 is negative   Fall Risk:    11/09/2023    7:42 AM 10/15/2023    2:32 PM 10/04/2023   10:24 AM 09/20/2023    9:34 AM 08/09/2023    8:08 AM  Fall Risk   Falls in the past year? 0 0 0 0 0  Number falls in past yr: 0  0 0   Injury with Fall? 0  0 0   Risk for fall due to : No Fall Risks  No Fall Risks No Fall Risks No Fall Risks  Follow up Falls prevention discussed  Falls prevention discussed;Education provided;Falls evaluation completed Falls prevention discussed;Education provided;Falls evaluation completed Falls prevention discussed    Assessment & Plan  Assessment and Plan    Lumbar Spinal Stenosis Severe spinal canal stenosis at L4-L5 with moderate to severe stenosis at L3, L4, L5, S1. Pain management with Pregabalin, Naproxen, and muscle relaxer. Surgical intervention delayed due to uncontrolled  diabetes. -Continue current pain management regimen. -Contact endocrinologist to improve diabetes control to allow for potential surgical intervention.  Type 2 Diabetes Poorly controlled with A1c of 12.4. Currently on Novolog, Basaglar, Jardiance, and Ozempic. -Increase Ozempic to 0.5mg  weekly. -Contact endocrinologist for further management. -Encouraged dietary modifications to reduce carbohydrate intake.  Coronary Artery Disease/Cardiomyopathy and Chronic Systolic Heart Failure No current symptoms of chest pain or shortness of breath. Medication regimen includes Amlodipine 5mg , Carvedilol 12.5mg  BID, Atorvastatin 80mg , Entresto , Ezetimibe 10mg , Jardiance, Nitroglycerin, and  Spironolactone. -Continue current medication regimen.  Anemia Chronic anemia potentially related to chronic kidney disease or uncontrolled diabetes. -Order CBC, iron studies, B12, and folate levels.  Allergic Rhinitis Reports of runny nose for the past few weeks. -Recommend Loratadine 10mg  daily. -Prescribe Atrovent nasal spray.  Migraine No recent episodes. Currently managed with  Nurtec as needed. -Continue current management.  Proliferative Diabetic Retinopathy both eyes Undergoing laser treatment. Next ophthalmology appointment scheduled for 01/14/2024. -Continue current treatment and follow-up with ophthalmologist.  Chronic Kidney Disease stage IIIa Stable with GFR in the 40s. Microalbuminuria improved from 258 to 67. -Continue current management.  Depression Reports of stress related to back pain and work but denies depression. -Continue current management.  Planned CABG Delayed due to insurance issues and prioritization of back pain management. No current symptoms of angina. -Continue current management and plan for surgery once back pain and diabetes are better controlled.

## 2023-11-23 ENCOUNTER — Ambulatory Visit (INDEPENDENT_AMBULATORY_CARE_PROVIDER_SITE_OTHER): Payer: Medicaid Other | Admitting: Ophthalmology

## 2023-11-23 ENCOUNTER — Encounter (INDEPENDENT_AMBULATORY_CARE_PROVIDER_SITE_OTHER): Payer: Self-pay | Admitting: Ophthalmology

## 2023-11-23 ENCOUNTER — Other Ambulatory Visit: Payer: Self-pay | Admitting: Nurse Practitioner

## 2023-11-23 DIAGNOSIS — H35033 Hypertensive retinopathy, bilateral: Secondary | ICD-10-CM

## 2023-11-23 DIAGNOSIS — I10 Essential (primary) hypertension: Secondary | ICD-10-CM | POA: Diagnosis not present

## 2023-11-23 DIAGNOSIS — H4311 Vitreous hemorrhage, right eye: Secondary | ICD-10-CM | POA: Diagnosis not present

## 2023-11-23 DIAGNOSIS — E113513 Type 2 diabetes mellitus with proliferative diabetic retinopathy with macular edema, bilateral: Secondary | ICD-10-CM | POA: Diagnosis not present

## 2023-11-23 DIAGNOSIS — H25813 Combined forms of age-related cataract, bilateral: Secondary | ICD-10-CM

## 2023-11-23 DIAGNOSIS — Z7984 Long term (current) use of oral hypoglycemic drugs: Secondary | ICD-10-CM

## 2023-11-23 DIAGNOSIS — Z794 Long term (current) use of insulin: Secondary | ICD-10-CM

## 2023-11-24 ENCOUNTER — Other Ambulatory Visit: Payer: Self-pay

## 2023-11-24 MED ORDER — ENTRESTO 24-26 MG PO TABS: 1.0000 | ORAL_TABLET | Freq: Two times a day (BID) | ORAL | 3 refills | Status: DC

## 2023-11-26 ENCOUNTER — Ambulatory Visit: Payer: Medicaid Other | Admitting: Internal Medicine

## 2023-11-26 NOTE — Progress Notes (Unsigned)
Name: Sarah Duffy  MRN/ DOB: [id number], November 20, 1971   Age/ Sex: 52 y.o., female    PCP: [name], MD   Reason for Endocrinology Evaluation: Type 2 Diabetes Mellitus     Date of Initial Endocrinology Visit: 08/31/2022    PATIENT IDENTIFIER: [name] is a 52 y.o. female with a past medical history of t2DM, Dyslipidemia, CAD, CHF. The patient presented for initial endocrinology clinic visit on 08/31/2022 for consultative assistance with her diabetes management.    HPI: [name] was    Diagnosed with DM years ago  Prior Medications tried/Intolerance: Metformin, glipizide, switch to insulin in 2017 after a huge left foot infection . Was on prandial insulin at some point         Hemoglobin A1c has ranged from 7.9% in 2023, peaking at 13.0% in 2020.  Has hx of left foot debridement  in 2017, had another infection and injury in 2021 while walking on a seashell   On her initial visit to our clinic she had an A1c of 7.9%, she was on Basaglar, Victoza, and Jardiance.  We switched Basaglar to [employer], switched Victoza to Trulicity and continued Jardiance     SUBJECTIVE:   During the last visit (06/21/2023): A1c 8.4%   Today (11/26/23): [name] is here for follow-up on diabetes management.  She  checks her blood sugars multiple times daily. The patient has  had hypoglycemic episodes since the last clinic visit, which typically occur at night .  Patient follows with cardiology for CAD, ischemic cardiomyopathy, HTN and dyslipidemia She does receive intra-articular glucocorticoid injections  Denies nausea, vomiting  Denies constipation  diarrhea     HOME DIABETES REGIMEN: Jardiance 25 mg daily  Ozempic 0.25 mg once weekly Basaglar 90 units daily Ozempic 0.5 mg weekly NovoLog Per correction factor : BG-120/30)    Statin: yes ACE-I/ARB: yes   CONTINUOUS GLUCOSE MONITORING RECORD INTERPRETATION    Dates of Recording: 6/25-06/21/2023  Sensor  description:dexcom  Results statistics:   CGM use % of time 93  Average and SD 197/65  Time in range  40  %  % Time Above 180 38  % Time above 250 20  % Time Below target 1   Glycemic patterns summary: BG's trend down overnight, and increase during the day  Hyperglycemic episodes postprandial  Hypoglycemic episodes occurred at night  Overnight periods: BG's trend down   DIABETIC COMPLICATIONS: Microvascular complications:  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 02/2021 Echo: EF 35-40%, glob HK, GrI DD, nl RV fxn, mildly dil LA, mod MR; b. 06/2022 Echo: EF 50-55%, no rwma, GrII DD, nl RV fxn, RVSP , mildly dil LA, mild MR, AoV sclerosis.   CKD (chronic kidney disease), stage III (HCC)    Complication of anesthesia 03/23/2023   Possible gastroparesis based off food in stomach during EGD   Coronary artery disease    a. 04/2021 Cath: LM nl, LAD 70p/m, 26m, 80d, LCX 78m, OM1 40, OM2 99 (fills via collats from OM1), RCA 60d, RPDA 75.   COVID-19 2021   Hyperlipidemia LDL goal <70    Hypertension    Ischemic cardiomyopathy    a. 02/2021 Echo: EF 35-40%, glob HK, GrI DD; b. 06/2022 Echo: EF 50-55%.  Moderate mitral regurgitation    a. 02/2021 Echo: Mod MR.   MRSA infection within last 3 months 02/25/2016   Osteomyelitis of foot (HCC) 08/26/2016   Type II diabetes mellitus (HCC)    Past Surgical History:  Past Surgical History:  Procedure Laterality Date   CHOLECYSTECTOMY  1999   COLONOSCOPY WITH PROPOFOL N/A 03/25/2021   Procedure: COLONOSCOPY WITH PROPOFOL;  Surgeon: [name], MD;  Location: [facility] ENDOSCOPY;  Service: Gastroenterology;  Laterality: N/A;   COLONOSCOPY WITH PROPOFOL N/A 04/17/2021    Procedure: COLONOSCOPY WITH PROPOFOL;  Surgeon: [name], MD;  Location: [facility] ENDOSCOPY;  Service: Gastroenterology;  Laterality: N/A;   ESOPHAGOGASTRODUODENOSCOPY (EGD) WITH PROPOFOL N/A 03/25/2021   Procedure: ESOPHAGOGASTRODUODENOSCOPY (EGD) WITH PROPOFOL;  Surgeon: [name], MD;  Location: [facility] ENDOSCOPY;  Service: Gastroenterology;  Laterality: N/A;   ESOPHAGOGASTRODUODENOSCOPY (EGD) WITH PROPOFOL N/A 04/17/2021   Procedure: ESOPHAGOGASTRODUODENOSCOPY (EGD) WITH PROPOFOL;  Surgeon: [name], MD;  Location: [facility] ENDOSCOPY;  Service: Gastroenterology;  Laterality: N/A;   ESOPHAGOGASTRODUODENOSCOPY (EGD) WITH PROPOFOL N/A 03/23/2023   Procedure: ESOPHAGOGASTRODUODENOSCOPY (EGD) WITH PROPOFOL;  Surgeon: [name], MD;  Location: [facility] ENDOSCOPY;  Service: Gastroenterology;  Laterality: N/A;   RIGHT/LEFT HEART CATH AND CORONARY ANGIOGRAPHY Bilateral 04/14/2021   Procedure: RIGHT/LEFT HEART CATH AND CORONARY ANGIOGRAPHY;  Surgeon: Yvonne Kendall, MD;  Location: [facility] INVASIVE CV LAB;  Service: Cardiovascular;  Laterality: Bilateral;   TOE SURGERY Left 02/07/2016   Pinky Toe    Social History:  reports that she has never smoked. She has never used smokeless tobacco. She reports that she does not drink alcohol and does not use drugs. Family History:  Family History  Problem Relation Age of Onset   Diabetes Mother    Ulcers Mother    Heart disease Father    AAA (abdominal aortic aneurysm) Father    Diabetes Father    Hypertension Father    Stroke Father    Alzheimer's disease Father    Heart attack Sister    Seizures Brother    Diabetes Maternal Grandmother    Breast cancer Maternal Grandmother      HOME MEDICATIONS: Allergies as of 11/26/2023   No Known Allergies      Medication List        Accurate as of November 26, 2023  6:45 AM. If you have any questions, ask your nurse or doctor.          Accu-Chek Guide test strip Generic drug: glucose blood Check blood sugar 2  times daily as needed   Accu-Chek Guide w/Device Kit Use to check blood sugar 2 time daily as needed   accu-chek multiclix lancets Check sugar 2 times daily as needed.   amLODipine 5 MG tablet Commonly known as: NORVASC Take 1 tablet (5 mg total) by mouth daily.   atorvastatin 80 MG tablet Commonly known as: LIPITOR Take 1 tablet (80 mg total) by mouth daily.   Basaglar KwikPen 100 UNIT/ML Inject 70 Units into the skin daily.   carvedilol 12.5 MG tablet Commonly known as: COREG TAKE 1 TABLET BY MOUTH 2 TIMES DAILY.   Dexcom G7 Sensor Misc 1 DEVICE BY DOES NOT APPLY ROUTE AS DIRECTED.   Entresto 24-26 MG Generic drug: sacubitril-valsartan Take 1 tablet by mouth 2 (two) times daily.   ezetimibe 10 MG tablet Commonly known as: ZETIA Take 1 tablet (10 mg total) by mouth daily.   Insulin Pen Needle 32G X 4 MM Misc 1 Device by Does not apply route in the morning, at  noon, in the evening, and at bedtime.   Jardiance 25 MG Tabs tablet Generic drug: empagliflozin Take 1 tablet (25 mg total) by mouth daily.   levocetirizine 5 MG tablet Commonly known as: XYZAL Take 1 tablet (5 mg total) by mouth daily at 2 PM.   metaxalone 800 MG tablet Commonly known as: SKELAXIN TAKE 1 TABLET (800 MG TOTAL) BY MOUTH 3 (THREE) TIMES DAILY AS NEEDED FOR MUSCLE SPASMS.   nitroGLYCERIN 0.4 MG SL tablet Commonly known as: NITROSTAT PLACE 1 TABLET UNDER THE TONGUE EVERY 5 MINUTES AS NEEDED FOR CHEST PAIN.   NovoLOG FlexPen 100 UNIT/ML FlexPen Generic drug: insulin aspart Max daily 30 units   Nurtec 75 MG Tbdp Generic drug: Rimegepant Sulfate Take 1 tablet (75 mg total) by mouth daily as needed. Max of every other day prn   Ozempic (0.25 or 0.5 MG/DOSE) 2 MG/3ML Sopn Generic drug: Semaglutide(0.25 or 0.5MG /DOS)   prednisoLONE acetate 1 % ophthalmic suspension Commonly known as: PRED FORTE Place 1 drop into the left eye 4 (four) times daily.   pregabalin 50 MG capsule Commonly  known as: Lyrica Take 1 capsule (50 mg total) by mouth 3 (three) times daily.   spironolactone 25 MG tablet Commonly known as: ALDACTONE Take 1 tablet (25 mg total) by mouth daily.   triamcinolone cream 0.1 % Commonly known as: KENALOG Apply 1 Application topically 2 (two) times daily.   Ventolin HFA 108 (90 Base) MCG/ACT inhaler Generic drug: albuterol INHALE 2 PUFFS INTO THE LUNGS EVERY 4 HOURS AS NEEDED FOR WHEEZING OR SHORTNESS OF BREATH.         ALLERGIES: No Known Allergies   REVIEW OF SYSTEMS: A comprehensive ROS was conducted with the patient and is negative except as per HPI    OBJECTIVE:   VITAL SIGNS: LMP  (LMP Unknown)    PHYSICAL EXAM:  General: Pt appears well and is in NAD  Neck: General: Supple without adenopathy or carotid bruits. Thyroid: Thyroid size normal.  No goiter or nodules appreciated.   Lungs: Clear with good BS bilat with no rales, rhonchi, or wheezes  Heart: RRR   Abdomen:  soft, nontender  Extremities:  Lower extremities - No pretibial edema.   Neuro: MS is good with appropriate affect, pt is alert and Ox3    DM foot exam: 06/21/2023  The skin of the feet is intact without sores or ulcerations. The pedal pulses are 2+ on right and 2+ on left. The sensation is absent to a screening 5.07, 10 gram monofilament bilaterally   DATA REVIEWED:  Lab Results  Component Value Date   HGBA1C 8.4 (A) 06/21/2023   HGBA1C 8.6 (A) 03/04/2023   HGBA1C 8.5 (A) 10/29/2022    Latest Reference Range & Units 11/02/23 09:53  Sodium 134 - 144 mmol/L 138  Potassium 3.5 - 5.2 mmol/L 5.0  Chloride 96 - 106 mmol/L 104  CO2 20 - 29 mmol/L 20  Glucose 70 - 99 mg/dL 409 (H)  BUN 6 - 24 mg/dL 23  Creatinine 8.11 - 9.14 mg/dL 7.82 (H)  Calcium 8.7 - 10.2 mg/dL 8.9  BUN/Creatinine Ratio 9 - 23  19  eGFR >59 mL/min/1.73 54 (L)  Direct LDL 0 - 99 mg/dL 72  (H): Data is abnormally high (L): Data is abnormally low     ASSESSMENT / PLAN /  RECOMMENDATIONS:   1) Type 2 Diabetes Mellitus, Poorly  controlled, With retinopathic, neuropathic, microalbuminuria  and macrovascular   complications - Most recent A1c of  8.4 %. Goal A1c < 7.0 %.    Patient continues with hyperglycemia  She has been noted with hypoglycemia overnight, will decrease Basaglar as below  She is tolerating Ozempic, will increase dose as below  She has been inconsistent with taking NovoLog per correction scale , she was encouraged again to use correction scale before meals , for example, last night she had a BG reading of 350 mg/DL, patient stated she" only" had Pepsi and corn chips, we discussed that both of these items are high in starch, hence BG reading 350 Mg/DL, referral to our CDE has been placed for education regarding carbohydrate  MEDICATIONS: Decrease Basaglar 70 units daily Increase Ozempic 0.5 mg weekly Continue Jardiance 25 mg daily Continue  NovoLog Per correction factor : BG-120/30) TIDQAC  EDUCATION / INSTRUCTIONS: BG monitoring instructions: Patient is instructed to check her blood sugars 3 times a day, before each meal Call [facility] if: BG persistently < 70  I reviewed the Rule of 15 for the treatment of hypoglycemia in detail with the patient. Literature supplied.   2) Diabetic complications:  Eye: Does  have known diabetic retinopathy.  Neuro/ Feet: Does  have known diabetic peripheral neuropathy. Renal: Patient does not have known baseline CKD. She is  on an ACEI/ARB at present.       F/U in 4 months    Signed electronically by: Lyndle Herrlich, MD  [facility] Endocrinology  [facility] [address] [place], [place] [address] Phone: [phone] FAX: [phone]   CC: [name], MD [address] [place] [address] Phone: [phone]  Fax: [phone]    Return to Endocrinology clinic as below: Future Appointments  Date Time Provider  Department Center  11/26/2023  7:30 AM Mithcell Schumpert, [name], MD [facility] None  12/06/2023 11:15 AM Heidi Dach, RN [facility] None  12/10/2023  1:20 PM [name], MD [facility] PEC  12/14/2023  8:30 AM Rennis Chris, MD TRE[facility]TRE None  12/17/2023  1:45 PM Bonnita Levan, RD [facility] NDM  01/06/2024 11:00 AM Levert Feinstein, MD [facility] None  02/02/2024  1:55 PM Charlsie Quest, NP [facility] None

## 2023-11-29 ENCOUNTER — Telehealth: Payer: Self-pay | Admitting: Neurology

## 2023-11-29 NOTE — Telephone Encounter (Signed)
error 

## 2023-11-30 ENCOUNTER — Ambulatory Visit (INDEPENDENT_AMBULATORY_CARE_PROVIDER_SITE_OTHER): Payer: Medicaid Other | Admitting: Neurology

## 2023-11-30 ENCOUNTER — Ambulatory Visit: Payer: Self-pay | Admitting: Neurology

## 2023-11-30 VITALS — BP 122/81 | HR 92 | Ht 69.0 in | Wt 177.0 lb

## 2023-11-30 DIAGNOSIS — G43709 Chronic migraine without aura, not intractable, without status migrainosus: Secondary | ICD-10-CM | POA: Diagnosis not present

## 2023-11-30 DIAGNOSIS — R269 Unspecified abnormalities of gait and mobility: Secondary | ICD-10-CM | POA: Diagnosis not present

## 2023-11-30 DIAGNOSIS — R29898 Other symptoms and signs involving the musculoskeletal system: Secondary | ICD-10-CM

## 2023-11-30 DIAGNOSIS — Z0289 Encounter for other administrative examinations: Secondary | ICD-10-CM

## 2023-11-30 DIAGNOSIS — E1042 Type 1 diabetes mellitus with diabetic polyneuropathy: Secondary | ICD-10-CM | POA: Diagnosis not present

## 2023-11-30 DIAGNOSIS — M48061 Spinal stenosis, lumbar region without neurogenic claudication: Secondary | ICD-10-CM | POA: Insufficient documentation

## 2023-11-30 NOTE — Procedures (Signed)
Full Name: Sarah Duffy Gender: Female MRN #: [id number] Date of Birth: February 07, 1971    Visit Date: 11/30/2023 09:20 Age: 52 Years Examining Physician: Dr. Levert Feinstein Referring Physician: Dr. Ernestine Mcmurray Height: 5 feet 9 inch History: 52 year old female with long history of poorly controlled diabetes, chronic low back pain, presenting with worsening lower extremity paresthesia, radiating pain to bilateral lower extremities, gait abnormality  Summary of the test:  Nerve conduction study: Bilateral sural, superficial peroneal, median, ulnar sensory responses were absent.  Bilateral radial sensory responses were within normal limit.  Bilateral tibial, peroneal to EDB motor response, right median motor response was absent.  Left median motor response showed severely prolonged distal latency and moderately decreased CMAP amplitude,  Bilateral ulnar motor responses also demonstrate mildly prolonged distal latency moderately decreased CMAP amplitude and slow conduction velocity  Electromyography: Selected needle examination were performed at bilateral lower extremity muscles, lumbosacral paraspinals; upper extremity muscles, cervical paraspinal muscles;, left mid thoracic paraspinal muscles.  There is evidence of active neuropathic changes involving bilateral L4 more than L5-S1 myotomes.  There is also evidence of active denervation at bilateral lower lumbosacral paraspinal muscles, motor unit for complex enlarged.  There is active denervation at bilateral abductor pollicis brevis, with complex enlarged motor unit potential with mildly decreased recruitment patterns.  Conclusion: This is an abnormal study.  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  There is also evidence  of severe bilateral carpal tunnel syndromes.    ------------------------------- Sarah Duffy.D.Ph.D.  [facility] [address] [place], [place] [address] Tel: [phone] Fax: [phone]  Verbal informed consent was obtained from the patient, patient was informed of potential risk of procedure, including bruising, bleeding, hematoma formation, infection, muscle weakness, muscle pain, numbness, among others.        MNC    Nerve / Sites Muscle Latency Ref. Amplitude Ref. Rel Amp Segments Distance Velocity Ref. Area    ms ms mV mV %  cm m/s m/s mVms  L Median - APB     Wrist APB 16.2 <=4.4 2.7 >=4.0 100 Wrist - APB 7   14.9     Upper arm APB 15.9  2.8  106 Upper arm - Wrist 26 734 >=49   R Median - APB     Wrist APB NR <=4.4 NR >=4.0 NR Wrist - APB 7   NR  L Ulnar - ADM     Wrist ADM 4.4 <=3.3 3.8 >=6.0 100 Wrist - ADM 7   15.2     B.Elbow ADM 6.8  2.1  53.7 B.Elbow - Wrist 12 49 >=49 9.2     A.Elbow ADM 12.4  1.2  55.7 A.Elbow - B.Elbow 16.6 30 >=49 6.5  R Ulnar - ADM     Wrist ADM 3.8 <=3.3 4.6 >=6.0 100 Wrist - ADM 7   18.9     B.Elbow ADM 7.4  2.1  45.3 B.Elbow - Wrist 12 34 >=49 9.6     A.Elbow ADM 10.7  2.0  94.6 A.Elbow - B.Elbow 17 50 >=49 8.0  R Peroneal - EDB     Ankle EDB NR <=6.5 NR >=2.0 NR Ankle - EDB 9   NR         Pop fossa - Ankle      L Peroneal - EDB  Ankle EDB NR <=6.5 NR >=2.0 NR Ankle - EDB 9   NR         Pop fossa - Ankle      R Tibial - AH     Ankle AH NR <=5.8 NR >=4.0 NR Ankle - AH 9   NR  L Tibial - AH     Ankle AH NR <=5.8 NR >=4.0 NR Ankle - AH 9   NR                     SNC    Nerve / Sites Rec. Site Peak Lat Ref.  Amp Ref. Segments Distance    ms ms V V  cm  L Radial - Anatomical snuff box (Forearm)     Forearm Wrist 2.7 <=2.9 16 >=15 Forearm - Wrist 10  R Radial - Anatomical snuff box (Forearm)     Forearm Wrist 2.8 <=2.9 24 >=15 Forearm - Wrist 10  R Sural - Ankle (Calf)     Calf Ankle NR <=4.4 NR >=6  Calf - Ankle 14  L Sural - Ankle (Calf)     Calf Ankle NR <=4.4 NR >=6 Calf - Ankle 14  R Superficial peroneal - Ankle     Lat leg Ankle NR <=4.4 NR >=6 Lat leg - Ankle 14  L Superficial peroneal - Ankle     Lat leg Ankle NR <=4.4 NR >=6 Lat leg - Ankle 14  L Median - Orthodromic (Dig II, Mid palm)     Dig II Wrist NR <=3.4 NR >=10 Dig II - Wrist 13  R Median - Orthodromic (Dig II, Mid palm)     Dig II Wrist NR <=3.4 NR >=10 Dig II - Wrist 13  L Ulnar - Orthodromic, (Dig V, Mid palm)     Dig V Wrist NR <=3.1 NR >=5 Dig V - Wrist 11  R Ulnar - Orthodromic, (Dig V, Mid palm)     Dig V Wrist NR <=3.1 NR >=5 Dig V - Wrist 69                         F  Wave    Nerve F Lat Ref.   ms ms  L Ulnar - ADM 35.5 <=32.0  R Ulnar - ADM 38.9 <=32.0         EMG Summary Table    Spontaneous MUAP Recruitment  Muscle IA Fib PSW Fasc Other Amp Dur. Poly Pattern  L. Tibialis anterior Normal 1+ 1+ None _______ Increased Increased 1+ Reduced  L. Peroneus longus Increased 1+ None None _______ Increased Increased 1+ Reduced  L. Gastrocnemius (Medial head) Normal None None None _______ Increased Increased 1+ Reduced  L. Vastus lateralis Normal None None None _______ Increased Increased 1+ Reduced  L. Biceps femoris (long head) Normal None None None _______ Normal Normal Normal Reduced  R. Tibialis anterior Normal 2+ 2+ None _______ Increased Increased 1+ Reduced  R. Tibialis posterior Increased 1+ None None _______ Increased Increased 1+ Reduced  R. Peroneus longus Increased 1+ None None _______ Increased Increased 1+ Reduced  R. Gastrocnemius (Medial head) Increased None None None _______ Increased Increased 1+ Reduced  R. Vastus lateralis Normal None None None _______ Normal Normal Normal Reduced  R. Biceps femoris (long head) Normal None None None _______ Increased Increased 1+ Reduced  R. Lumbar paraspinals (low) Increased 1+ None None _______ Increased Increased 1+ Reduced  R. Lumbar paraspinals  (mid)  Normal None None None _______ Normal Normal Normal Normal  L. Lumbar paraspinals (low) Increased 1+ None None _______ Increased Increased 1+ Reduced  L. Lumbar paraspinals (mid) Normal None None None _______ Normal Normal Normal Normal  R. First dorsal interosseous Normal None None None _______ Normal Normal Normal Normal  R. Abductor pollicis brevis Increased 1+ None None _______ Increased Increased 1+ Reduced  R. Pronator teres Normal None None None _______ Normal Normal Normal Normal  R. Biceps brachii Normal None None None _______ Normal Normal Normal Normal  R. Deltoid Normal None None None _______ Normal Normal Normal Normal  R. Triceps brachii Normal None None None _______ Normal Normal Normal Normal  L. First dorsal interosseous Normal None None None _______ Normal Normal Normal Reduced  L. Abductor pollicis brevis Increased 1+ None None _______ Increased Increased 1+ Reduced  L. Biceps brachii Normal None None None _______ Normal Normal Normal Normal  L. Deltoid Normal None None None _______ Normal Normal Normal Normal  L. Triceps brachii Normal None None None _______ Normal Normal Normal Normal  L. Extensor digitorum communis Normal None None None _______ Normal Normal Normal Normal  L. Cervical paraspinals Normal None None None _______ Normal Normal Normal Normal  R. Cervical paraspinals Normal None None None _______ Normal Normal Normal Normal  L. Thoracic paraspinals (mid) Normal None None None _______ Normal Normal Normal Normal

## 2023-11-30 NOTE — Progress Notes (Signed)
ASSESSMENT AND PLAN  Sarah Duffy is a 52 y.o. female   Acute worsening gait abnormality with severe low back pain  EMG nerve conduction study today demonstrate severe peripheral neuropathy, consistent with her long history of suboptimal controlled diabetes,   in addition, there is evidence of active bilateral L4-5 more than S1 lumbar radiculopathy; severe bilateral carpal tunnel syndromes   She also reported acute onset of left arm left leg weakness mild dysarthria  Multiple vascular risk factors, MRI of the brain to rule out right hemisphere stroke  On Asa 81mg  daily  Chronic migraine headache  Under better control with Emgality monthly injection, Ubrelvy as needed   DIAGNOSTIC DATA (LABS, IMAGING, TESTING) - I reviewed patient records, labs, notes, testing and imaging myself where available.   MEDICAL HISTORY:  Sarah Duffy is a 52 year old female, seen in request by Neurosurgeon Dr. Ernestine Mcmurray for EMG nerve conduction study to rule out underlying neuromuscular disorder for her acute worsening gait abnormality, migraine headache,, her primary care is at [facility]   [name], initial evaluation November 30, 2023  History is obtained from the patient and review of electronic medical records. I personally reviewed pertinent available imaging films in PACS.   PMHx of  DM for more than 10 years. DM peripheral neuropathy HTN HLD CAD CHF  She had long history of chronic low back pain, getting worse since 2024, initially was to right hip, now also experience radiating pain to her left hip, left lower extremity  She had long history of diabetic peripheral neuropathy, getting worse, starting from plantar surface now to above ankle level, since August 2024, she noticed subacute worsening of gait abnormality, difficulty lifting up her left leg,  Around that time, she also noticed mild worsening left upper extremity weakness, mildly slurred speech, no significant  progress since his onset,  She denies bowel or bladder incontinence,   She had long history of chronic migraine, under much better control since Emgality in August 2024, Nurtec 75 mg as needed was helpful  CT and MRI of lumbar spine from November 2024 showed multilevel degenerative changes, 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 Eyes: Conjunctivae clear without exudates or hemorrhage Neck: Supple, no carotid bruits. Pulmonary: Clear to auscultation bilaterally   NEUROLOGICAL EXAM:  MENTAL STATUS: Speech/cognition: Awake, alert, oriented to history taking and casual conversation CRANIAL NERVES: CN II: Visual fields are full to confrontation. Pupils are round equal and briskly reactive to light. CN III, IV, VI: extraocular movement are normal. No ptosis. CN V: Facial sensation is intact to light touch CN VII: Face is symmetric with normal eye closure  CN VIII: Hearing is normal to causal conversation. CN IX, X:  Phonation is normal. CN XI: Head turning and shoulder shrug are intact CN XII: Mild slow spastic tongue movement, no tongue atrophy or muscle weakness  MOTOR: Mild fixation of left upper extremity on rapid rotating movement, mild left shoulder abduction left grip weakness  Mild bilateral hip flexion weakness, moderate bilateral ankle dorsiflexion weakness left worse than right  REFLEXES: Areflexia  SENSORY:  Length-dependent decreased light touch pinprick to knee level  COORDINATION: There is no trunk or limb dysmetria noted.  GAIT/STANCE: Push-up to get up from seated position, wide-based, unsteady, left foot drop, could not stand up bilateral heels, able to stand up on tiptoe  REVIEW OF SYSTEMS:  Full 14 system review of systems performed and notable only for as above All other review of systems were negative.   ALLERGIES: No Known Allergies  HOME MEDICATIONS: Current Outpatient Medications  Medication Sig Dispense Refill   albuterol (VENTOLIN HFA) 108 (90 Base) MCG/ACT inhaler INHALE 2 PUFFS INTO THE LUNGS EVERY 4 HOURS AS NEEDED FOR WHEEZING OR SHORTNESS OF BREATH. 18 each 1   amLODipine (NORVASC) 5 MG tablet Take 1 tablet (5 mg total) by mouth daily. 90 tablet 1   atorvastatin (LIPITOR) 80 MG tablet Take 1 tablet (80 mg total) by mouth daily. 90 tablet 1   Blood Glucose Monitoring Suppl (ACCU-CHEK GUIDE) w/Device KIT Use to check blood sugar 2 time daily as needed 1 kit 0   carvedilol (COREG) 12.5 MG tablet TAKE 1 TABLET BY MOUTH 2 TIMES DAILY. 180 tablet 0   Continuous Glucose Sensor (DEXCOM G7 SENSOR) MISC 1 DEVICE BY DOES NOT APPLY ROUTE AS DIRECTED. 3 each 11   ENTRESTO 24-26 MG Take 1 tablet by mouth 2 (two) times daily. 60 tablet 3   ezetimibe (ZETIA) 10 MG tablet Take 1 tablet (10 mg total) by mouth daily. 90 tablet 1   glucose blood (ACCU-CHEK GUIDE) test strip Check blood sugar 2 times daily as needed 100 each 12   insulin aspart (NOVOLOG FLEXPEN) 100 UNIT/ML FlexPen Max daily 30 units 30 mL 3   Insulin Glargine (BASAGLAR KWIKPEN) 100 UNIT/ML Inject 70 Units into the skin daily. 60 mL 3   Insulin Pen Needle 32G X 4 MM MISC 1 Device by Does not apply route in the morning, at noon, in the evening, and at bedtime. 400 each 3   JARDIANCE 25 MG TABS tablet Take 1 tablet (25 mg total) by mouth daily. 90 tablet 2   Lancets (ACCU-CHEK MULTICLIX) lancets Check sugar 2 times daily as  needed. 100 each 12   levocetirizine (XYZAL) 5 MG tablet Take 1 tablet (5 mg total) by mouth daily at 2 PM. 90 tablet 1   metaxalone (SKELAXIN) 800 MG tablet TAKE 1 TABLET (800 MG TOTAL) BY MOUTH 3 (THREE) TIMES DAILY AS NEEDED FOR MUSCLE SPASMS. 90 tablet 0   nitroGLYCERIN (NITROSTAT) 0.4 MG SL tablet PLACE 1 TABLET UNDER THE TONGUE EVERY 5 MINUTES AS NEEDED FOR CHEST PAIN. (Patient not taking: Reported on 11/22/2023) 25 tablet 2   nortriptyline (PAMELOR) 10 MG capsule Take 10 mg by mouth at bedtime.     OZEMPIC, 0.25 OR 0.5 MG/DOSE, 2 MG/3ML SOPN      prednisoLONE acetate (PRED FORTE) 1 % ophthalmic suspension Place 1 drop into the left eye 4 (four) times daily. 10 mL 0   pregabalin (LYRICA) 50 MG capsule Take 1 capsule (50 mg total) by mouth 3 (three) times daily. 90 capsule 0   Rimegepant Sulfate (NURTEC) 75 MG TBDP  Take 1 tablet (75 mg total) by mouth daily as needed. Max of every other day prn 16 tablet 0   spironolactone (ALDACTONE) 25 MG tablet Take 1 tablet (25 mg total) by mouth daily. 90 tablet 0   triamcinolone cream (KENALOG) 0.1 % Apply 1 Application topically 2 (two) times daily. 453.6 g 0   No current facility-administered medications for this visit.    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 02/2021 Echo: EF 35-40%, glob HK, GrI DD, nl RV fxn, mildly dil LA, mod MR; b. 06/2022 Echo: EF 50-55%, no rwma, GrII DD, nl RV fxn, RVSP , mildly dil LA, mild MR, AoV sclerosis.   CKD (chronic kidney disease), stage III (HCC)    Complication of anesthesia 03/23/2023   Possible gastroparesis based off food in stomach during EGD   Coronary artery disease    a. 04/2021 Cath: LM nl, LAD 70p/m, 69m, 80d, LCX 91m, OM1 40, OM2 99 (fills via collats from OM1), RCA 60d, RPDA 75.   COVID-19 2021   Hyperlipidemia LDL goal <70    Hypertension    Ischemic cardiomyopathy    a. 02/2021 Echo: EF  35-40%, glob HK, GrI DD; b. 06/2022 Echo: EF 50-55%.   Moderate mitral regurgitation    a. 02/2021 Echo: Mod MR.   MRSA infection within last 3 months 02/25/2016   Osteomyelitis of foot (HCC) 08/26/2016   Type II diabetes mellitus (HCC)     PAST SURGICAL HISTORY: Past Surgical History:  Procedure Laterality Date   CHOLECYSTECTOMY  1999   COLONOSCOPY WITH PROPOFOL N/A 03/25/2021   Procedure: COLONOSCOPY WITH PROPOFOL;  Surgeon: [name], MD;  Location: [facility] ENDOSCOPY;  Service: Gastroenterology;  Laterality: N/A;   COLONOSCOPY WITH PROPOFOL N/A 04/17/2021   Procedure: COLONOSCOPY WITH PROPOFOL;  Surgeon: [name], MD;  Location: [facility] ENDOSCOPY;  Service: Gastroenterology;  Laterality: N/A;   ESOPHAGOGASTRODUODENOSCOPY (EGD) WITH PROPOFOL N/A 03/25/2021   Procedure: ESOPHAGOGASTRODUODENOSCOPY (EGD) WITH PROPOFOL;  Surgeon: [name], MD;  Location: [facility] ENDOSCOPY;  Service: Gastroenterology;  Laterality: N/A;   ESOPHAGOGASTRODUODENOSCOPY (EGD) WITH PROPOFOL N/A 04/17/2021   Procedure: ESOPHAGOGASTRODUODENOSCOPY (EGD) WITH PROPOFOL;  Surgeon: [name], MD;  Location: [facility] ENDOSCOPY;  Service: Gastroenterology;  Laterality: N/A;   ESOPHAGOGASTRODUODENOSCOPY (EGD) WITH PROPOFOL N/A 03/23/2023   Procedure: ESOPHAGOGASTRODUODENOSCOPY (EGD) WITH PROPOFOL;  Surgeon: [name], MD;  Location: [facility] ENDOSCOPY;  Service: Gastroenterology;  Laterality: N/A;   RIGHT/LEFT HEART CATH AND CORONARY ANGIOGRAPHY Bilateral 04/14/2021   Procedure: RIGHT/LEFT HEART CATH AND CORONARY ANGIOGRAPHY;  Surgeon: Yvonne Kendall, MD;  Location: [facility] INVASIVE CV LAB;  Service: Cardiovascular;  Laterality: Bilateral;   TOE SURGERY Left 02/07/2016   Pinky Toe    FAMILY HISTORY: Family History  Problem Relation Age of Onset   Diabetes Mother    Ulcers Mother    Heart disease Father    AAA (abdominal aortic aneurysm) Father    Diabetes Father    Hypertension Father    Stroke Father    Alzheimer's disease Father     Heart attack Sister    Seizures Brother    Diabetes Maternal Grandmother    Breast cancer Maternal Grandmother     SOCIAL HISTORY: Social History   Socioeconomic History   Marital status: Single    Spouse name: Not on file   Number of children: 1   Years of education: Not on file   Highest education level: Some  college, no degree  Occupational History   Occupation: Dealer   Tobacco Use   Smoking status: Never   Smokeless tobacco: Never  Vaping Use   Vaping status: Never Used  Substance and Sexual Activity   Alcohol use: No    Alcohol/week: 0.0 standard drinks of alcohol   Drug use: No   Sexual activity: Yes    Partners: Male    Birth control/protection: Injection  Other Topics Concern   Not on file  Social History Narrative   She used to work at [employer], but  Feb 2017 she left work because of  MRSA infection.osteomyelitis and uncontrolled DM. She has been back to work since Feb 2018   Lives alone   Social Drivers of Health   Financial Resource Strain: High Risk (11/09/2023)   Overall Financial Resource Strain (CARDIA)    Difficulty of Paying Living Expenses: Hard  Food Insecurity: Food Insecurity Present (11/09/2023)   Hunger Vital Sign    Worried About Running Out of Food in the Last Year: Often true    Ran Out of Food in the Last Year: Often true  Transportation Needs: No Transportation Needs (11/09/2023)   PRAPARE - Administrator, Civil Service (Medical): No    Lack of Transportation (Non-Medical): No  Physical Activity: Inactive (11/09/2023)   Exercise Vital Sign    Days of Exercise per Week: 0 days    Minutes of Exercise per Session: 20 min  Stress: Stress Concern Present (11/09/2023)   [employer] of Occupational Health - Occupational Stress Questionnaire    Feeling of Stress : To some extent  Social Connections: Moderately Isolated (11/09/2023)   Social Connection and Isolation Panel [NHANES]    Frequency of  Communication with Friends and Family: Three times a week    Frequency of Social Gatherings with Friends and Family: Once a week    Attends Religious Services: 1 to 4 times per year    Active Member of [employer] or Organizations: No    Attends Banker Meetings: Not on file    Marital Status: Never married  Intimate Partner Violence: Not At Risk (05/01/2022)   Humiliation, Afraid, Rape, and Kick questionnaire    Fear of Current or Ex-Partner: No    Emotionally Abused: No    Physically Abused: No    Sexually Abused: No      Levert Feinstein, M.D. Ph.D.  [facility] [address] [place], [place] [address] Ph: [phone] Fax: [phone]  CC:  [name], MD [address] [place],  [place] [address]  [name], MD

## 2023-11-30 NOTE — Progress Notes (Signed)
EMG nerve conduction study report is under procedure tab 

## 2023-12-01 ENCOUNTER — Other Ambulatory Visit: Payer: Self-pay | Admitting: Family Medicine

## 2023-12-01 DIAGNOSIS — Z1231 Encounter for screening mammogram for malignant neoplasm of breast: Secondary | ICD-10-CM

## 2023-12-02 DIAGNOSIS — M48061 Spinal stenosis, lumbar region without neurogenic claudication: Secondary | ICD-10-CM | POA: Diagnosis not present

## 2023-12-02 DIAGNOSIS — M5416 Radiculopathy, lumbar region: Secondary | ICD-10-CM | POA: Diagnosis not present

## 2023-12-06 ENCOUNTER — Other Ambulatory Visit: Payer: Self-pay | Admitting: *Deleted

## 2023-12-06 NOTE — Patient Outreach (Signed)
Medicaid Managed Care   Nurse Care Manager Note  12/06/2023 Name:  Maleni Betke MRN:  [id number] DOB:  [birth year]  Samaya Byl is an 52 y.o. year old female who is a primary patient of [name], MD.  The [facility] Managed Care Coordination team was consulted for assistance with:    DMII Pain  Ms. Fetsch was given information about Medicaid Managed Care Coordination team services today. [name] Patient agreed to services and verbal consent obtained.  Engaged with patient by telephone for follow up visit in response to provider referral for case management and/or care coordination services.   Patient is participating in a Managed Medicaid Plan:  Yes  Assessments/Interventions:  Review of past medical history, allergies, medications, health status, including review of consultants reports, laboratory and other test data, was performed as part of comprehensive evaluation and provision of chronic care management services.  SDOH (Social Drivers of Health) assessments and interventions performed: SDOH Interventions    Flowsheet Row Patient Outreach Telephone from 12/06/2023 in [place] POPULATION HEALTH DEPARTMENT Patient Outreach Telephone from 05/24/2023 in [place] POPULATION HEALTH DEPARTMENT Patient Outreach Telephone from 04/13/2023 in [place] POPULATION HEALTH DEPARTMENT Patient Outreach Telephone from 04/05/2023 in [place] POPULATION HEALTH DEPARTMENT Office Visit from 04/29/2021 in [facility] Office Visit from 02/11/2021 in [facility]  SDOH Interventions        Housing Interventions Intervention Not Indicated -- -- -- Other (Comment)  [name] got some information from pre-op] --  Transportation Interventions Intervention Not Indicated -- -- Intervention Not Indicated -- --  Utilities Interventions Intervention Not Indicated -- Intervention Not Indicated -- -- --  Depression Interventions/Treatment  -- -- -- -- --  Medication  Stress Interventions -- [employer], Provide Counseling -- [employer], Provide Counseling -- --       Care Plan  No Known Allergies  Medications Reviewed Today     Reviewed by Heidi Dach, RN (Registered Nurse) on 12/06/23 at 1157  Med List Status: <None>   Medication Order Taking? Sig Documenting Provider Last Dose Status Informant  albuterol (VENTOLIN HFA) 108 (90 Base) MCG/ACT inhaler [id number] Yes INHALE 2 PUFFS INTO THE LUNGS EVERY 4 HOURS AS NEEDED FOR WHEEZING OR SHORTNESS OF BREATH. Berniece Salines, FNP Taking Active   amLODipine (NORVASC) 5 MG tablet [id number] Yes Take 1 tablet (5 mg total) by mouth daily. [name], MD Taking Active   atorvastatin (LIPITOR) 80 MG tablet [id number] Yes Take 1 tablet (80 mg total) by mouth daily. [name], MD Taking Active   Blood Glucose Monitoring Suppl (ACCU-CHEK GUIDE) w/Device KIT [id number] Yes Use to check blood sugar 2 time daily as needed [name], [name], MD Taking Active   carvedilol (COREG) 12.5 MG tablet [id number] Yes TAKE 1 TABLET BY MOUTH 2 TIMES DAILY. Creig Hines, NP Taking Active   Continuous Glucose Sensor (DEXCOM G7 SENSOR) [place] [id number] Yes 1 DEVICE BY DOES NOT APPLY ROUTE AS DIRECTED. [name], [name], MD Taking Active   ENTRESTO 24-26 [place] [id number] Yes Take 1 tablet by mouth 2 (two) times daily. [name], MD Taking Active   ezetimibe (ZETIA) 10 MG tablet [id number] Yes Take 1 tablet (10 mg total) by mouth daily. [name], MD Taking Active   glucose blood (ACCU-CHEK GUIDE) test strip [id number] Yes Check blood sugar 2 times daily as needed [name], [name], MD Taking Active   insulin aspart (NOVOLOG FLEXPEN) 100 UNIT/ML FlexPen [id number]  Yes Max daily 30 units [name], [name], MD Taking Active   Insulin Glargine (BASAGLAR KWIKPEN) 100 UNIT/ML [id number] Yes Inject 70 Units into  the skin daily. [name], [name], MD Taking Active   Insulin Pen Needle 32G X 4 MM MISC [id number] Yes 1 Device by Does not apply route in the morning, at noon, in the evening, and at bedtime. [name], [name], MD Taking Active   JARDIANCE 25 MG TABS tablet [id number] Yes Take 1 tablet (25 mg total) by mouth daily. [name], [name], MD Taking Active   Lancets (ACCU-CHEK MULTICLIX) lancets [id number] Yes Check sugar 2 times daily as needed. [name], [name], MD Taking Active   levocetirizine (XYZAL) 5 MG tablet [id number] Yes Take 1 tablet (5 mg total) by mouth daily at 2 PM. [name], MD Taking Active   metaxalone [facility]) 800 MG tablet [id number] Yes TAKE 1 TABLET (800 MG TOTAL) BY MOUTH 3 (THREE) TIMES DAILY AS NEEDED FOR MUSCLE SPASMS. [name], MD Taking Active   naproxen (NAPROSYN) 500 MG tablet [id number] Yes  [provider] Taking Active   nitroGLYCERIN (NITROSTAT) 0.4 MG SL tablet [id number]  PLACE 1 TABLET UNDER THE TONGUE EVERY 5 MINUTES AS NEEDED FOR CHEST PAIN.  Patient not taking: Reported on 11/22/2023   [name], MD  Active   nortriptyline (PAMELOR) 10 MG capsule [id number] Yes Take 10 mg by mouth at bedtime. [provider] Taking Active   OZEMPIC, 0.25 OR 0.5 MG/DOSE, 2 MG/3ML SOPN [id number] No   Patient not taking: Reported on 12/06/2023   [provider] Not Taking Active   prednisoLONE acetate (PRED FORTE) 1 % ophthalmic suspension [id number] No Place 1 drop into the left eye 4 (four) times daily.  Patient not taking: Reported on 12/06/2023   Rennis Chris, MD Not Taking Active   pregabalin (LYRICA) 50 MG capsule [id number] Yes Take 1 capsule (50 mg total) by mouth 3 (three) times daily. [name], MD Taking Active   Rimegepant Sulfate (NURTEC) 75 MG TBDP [id number] Yes Take 1 tablet (75 mg total) by mouth daily as needed. Max of every other day prn [name], MD Taking Active    spironolactone (ALDACTONE) 25 MG tablet [id number] Yes Take 1 tablet (25 mg total) by mouth daily. [name], MD Taking Active   triamcinolone cream (KENALOG) 0.1 % [id number] No Apply 1 Application topically 2 (two) times daily.  Patient not taking: Reported on 12/06/2023   [name], MD Not Taking Active             Patient Active Problem List   Diagnosis Date Noted   Gait abnormality 11/30/2023   DM type 1 with diabetic peripheral neuropathy (HCC) 11/30/2023   Spinal stenosis of lumbar region 11/30/2023   Chronic kidney disease, stage 3a (HCC) 11/09/2023   Weakness of extremity 09/20/2023   Arthropathy of lumbar facet joint 07/29/2023   Low back pain 07/29/2023   Pain in finger of left hand 07/29/2023   Sacroiliac joint pain 07/29/2023   Contact dermatitis and eczema 06/08/2023   Dysphagia 03/23/2023   Asthma, well controlled 03/09/2023   Depression, major, in remission (HCC) 03/09/2023   Perennial allergic rhinitis with seasonal variation 03/09/2023   Type 2 diabetes mellitus with diabetic polyneuropathy, with long-term current use of insulin (HCC) 08/31/2022   Type 2 diabetes mellitus with retinopathy, with long-term current use of insulin (HCC) 08/31/2022   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(HCC) 08/31/2022   Hypertension 06/11/2022   Ischemic cardiomyopathy 06/11/2022  Coronary artery disease 06/11/2022   Chronic systolic heart failure (HCC) 06/11/2022   Angina pectoris associated with type 2 diabetes mellitus (HCC) 01/05/2022   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 edema associated with type 2 diabetes mellitus (HCC) 05/01/2021   Cardiomyopathy (HCC) 04/14/2021   Depression, major, recurrent, mild (HCC) 09/25/2019   History of 2019 novel coronavirus disease (COVID-19) 06/23/2019   Chronic migraine w/o aura w/o status migrainosus, not intractable 01/14/2018   Type II diabetes mellitus (HCC) 05/22/2017   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 associated with type 2 diabetes mellitus (HCC) 05/21/2016   Severe nonproliferative diabetic retinopathy of right eye without macular edema associated with type 2 diabetes mellitus (HCC) 05/21/2016   Hyperlipidemia LDL goal <70 03/10/2016   MI (mitral incompetence) 02/25/2016   Insomnia 02/25/2016   Anxiety 09/04/2014    Conditions to be addressed/monitored per PCP order:  DMII and Pain  Care Plan : RN Care Manager Plan of Care  Updates made by Heidi Dach, RN since 12/06/2023 12:00 AM     Problem: Health Management needs related to DMII      Long-Range Goal: Development of Plan of Care to address Health Management needs related to DMII   Start Date: 04/13/2023  Expected End Date: 01/14/2024  Note:   Current Barriers:  Chronic Disease Management support and education needs related to DMII-Ms. Borders has ongoing low back pain and left leg weakness. Patient completed PT, seen by Neurosurgeon. Brain MRI ordered.   RNCM Clinical Goal(s):  Patient will verbalize understanding of plan for management of DMII as evidenced by patient reports take all medications exactly as prescribed and will call provider for medication related questions as evidenced by patient reports    attend all scheduled medical appointments: 12/06/23 with [facility] Internal Medicine, 12/09/23 for Mammography, 12/10/23 with PCP, 12/14/23 with Retinal Specialist, 12/17/23 with Diabetic Education  Neurology 01/05/23, 01/19/24 with Dr. Malvin Johns, and 02/02/24 Cardiology as evidenced by provider documentation in EMR        continue to work with RN Care Manager and/or Social Worker to address care management and care coordination needs related to DMII as evidenced by adherence to CM Team Scheduled appointments     through collaboration with Medical illustrator, provider, and care team.   Interventions: Inter-disciplinary care team collaboration (see longitudinal plan of  care) Evaluation of current treatment plan related to  self management and patient's adherence to plan as established by provider Provided therapeutic listening Advised patient to look in to orthotic shoe inserts to help with back pain   Pain Interventions:  (Status:  New goal.) Long Term Goal Pain assessment performed Medications reviewed Reviewed provider established plan for pain management Discussed importance of adherence to all scheduled medical appointments Counseled on the importance of reporting any/all new or changed pain symptoms or management strategies to pain management provider Advised patient to report to care team affect of pain on daily activities Discussed use of relaxation techniques and/or diversional activities to assist with pain reduction (distraction, imagery, relaxation, massage, acupressure, TENS, heat, and cold application Reviewed with patient prescribed pharmacological and nonpharmacological pain relief strategies Assessed social determinant of health barriers   Diabetes:  (Status: Goal on Track (progressing): YES.) Long Term Goal   Lab Results  Component Value Date   HGBA1C 8.4 (A) 06/21/2023   @ Assessed patient's understanding of A1c goal: <7% Provided education to patient about basic DM disease process; Reviewed prescribed diet with patient  low carb/MyPlate method, provided examples of food choices; Discussed plans with patient for ongoing care management follow up and provided patient with direct contact information for care management team;      Reviewed scheduled/upcoming provider appointments including: see above list;         Assessed social determinant of health barriers;        Discussed healthy food choices and provided with education Advised patient to reschedule missed visit with Endocrinology   Patient Goals/Self-Care Activities: Take medications as prescribed   Attend all scheduled provider appointments Call provider office for new  concerns or questions  drink 6 to 8 glasses of water each day fill half of plate with vegetables manage portion size       Follow Up:  Patient agrees to Care Plan and Follow-up.  Plan: The Managed Medicaid care management team will reach out to the patient again over the next 30 days.  Date/time of next scheduled RN care management/care coordination outreach:  01/05/24 at 11:15am  Estanislado Emms RN, BSN [place]  Value-Based [facility] RN Care Coordinator [phone]

## 2023-12-06 NOTE — Patient Instructions (Signed)
Visit Information  Sarah Duffy was given information about Medicaid Managed Care team care coordination services as a part of their [facility] Medicaid benefit. Sarah Duffy verbally consented to engagement with the [facility] Managed Care team.   If you are experiencing a medical emergency, please call 911 or report to your local emergency department or urgent care.   If you have a non-emergency medical problem during routine business hours, please contact your provider's office and ask to speak with a nurse.   For questions related to your [facility], please call: [phone] or visit the homepage here: [email or web address]  If you would like to schedule transportation through your [facility], please call the following number at least 2 days in advance of your appointment: [phone]   Rides for urgent appointments can also be made after hours by calling Member Services.  Call the Behavioral Health Crisis Line at [phone] [phone], at any time, 24 hours a day, 7 days a week. If you are in danger or need immediate medical attention call 911.  If you would like help to quit smoking, call 1-800-QUIT-NOW ((940) 701-1267) OR Espaol: 1-855-Djelo-Ya (1-324-401-0272) o para ms informacin haga clic aqu or Text READY to 536-644 to register via text  Sarah Duffy,   Please see education materials related to pain management provided by MyChart link.  Patient verbalizes understanding of instructions and care plan provided today and agrees to view in MyChart. Active MyChart status and patient understanding of how to access instructions and care plan via MyChart confirmed with patient.     Telephone follow up appointment with Managed Medicaid care management team member scheduled for:01/05/24 at 11:15pm  Sarah Emms RN, BSN [place]  Value[facility]  Health RN Care Coordinator [phone]   Following is a copy of your plan of care:  Care Plan : RN Care Manager Plan of Care  Updates made by Sarah Dach, RN since 12/06/2023 12:00 AM     Problem: Health Management needs related to DMII      Long-Range Goal: Development of Plan of Care to address Health Management needs related to DMII   Start Date: 04/13/2023  Expected End Date: 01/14/2024  Note:   Current Barriers:  Chronic Disease Management support and education needs related to DMII-Sarah Duffy has ongoing low back pain and left leg weakness. Patient completed PT, seen by Neurosurgeon. Brain MRI ordered.   RNCM Clinical Goal(s):  Patient will verbalize understanding of plan for management of DMII as evidenced by patient reports take all medications exactly as prescribed and will call provider for medication related questions as evidenced by patient reports    attend all scheduled medical appointments: 12/06/23 with [facility] Internal Medicine, 12/09/23 for Mammography, 12/10/23 with PCP, 12/14/23 with Retinal Specialist, 12/17/23 with Diabetic Education  Neurology 01/05/23, 01/19/24 with Dr. Malvin Johns, and 02/02/24 Cardiology as evidenced by provider documentation in EMR        continue to work with RN Care Manager and/or Social Worker to address care management and care coordination needs related to DMII as evidenced by adherence to CM Team Scheduled appointments     through collaboration with RN Care manager, provider, and care team.   Interventions: Inter-disciplinary care team collaboration (see longitudinal plan of care) Evaluation of current treatment plan related to  self management and patient's adherence to plan as established by provider Provided therapeutic listening Advised patient to look in to orthotic shoe inserts to help with back  pain   Pain Interventions:  (Status:  New goal.) Long Term Goal Pain assessment performed Medications reviewed Reviewed provider established  plan for pain management Discussed importance of adherence to all scheduled medical appointments Counseled on the importance of reporting any/all new or changed pain symptoms or management strategies to pain management provider Advised patient to report to care team affect of pain on daily activities Discussed use of relaxation techniques and/or diversional activities to assist with pain reduction (distraction, imagery, relaxation, massage, acupressure, TENS, heat, and cold application Reviewed with patient prescribed pharmacological and nonpharmacological pain relief strategies Assessed social determinant of health barriers   Diabetes:  (Status: Goal on Track (progressing): YES.) Long Term Goal   Lab Results  Component Value Date   HGBA1C 8.4 (A) 06/21/2023   @ Assessed patient's understanding of A1c goal: <7% Provided education to patient about basic DM disease process; Reviewed prescribed diet with patient low carb/MyPlate method, provided examples of food choices; Discussed plans with patient for ongoing care management follow up and provided patient with direct contact information for care management team;      Reviewed scheduled/upcoming provider appointments including: see above list;         Assessed social determinant of health barriers;        Discussed healthy food choices and provided with education Advised patient to reschedule missed visit with Endocrinology   Patient Goals/Self-Care Activities: Take medications as prescribed   Attend all scheduled provider appointments Call provider office for new concerns or questions  drink 6 to 8 glasses of water each day fill half of plate with vegetables manage portion size

## 2023-12-07 ENCOUNTER — Telehealth: Payer: Self-pay | Admitting: Neurology

## 2023-12-07 NOTE — Telephone Encounter (Signed)
[employer] medicaid Berkley Harvey: [id number] exp. 12/07/23-01/21/24 sent to GI [phone]

## 2023-12-09 ENCOUNTER — Ambulatory Visit
Admission: RE | Admit: 2023-12-09 | Discharge: 2023-12-09 | Disposition: A | Discharge status: Home / Self Care | Payer: Medicaid Other | Source: Ambulatory Visit | Attending: Family Medicine | Admitting: Family Medicine

## 2023-12-09 DIAGNOSIS — Z1231 Encounter for screening mammogram for malignant neoplasm of breast: Secondary | ICD-10-CM | POA: Insufficient documentation

## 2023-12-10 ENCOUNTER — Ambulatory Visit (INDEPENDENT_AMBULATORY_CARE_PROVIDER_SITE_OTHER): Payer: Medicaid Other | Admitting: Family Medicine

## 2023-12-10 ENCOUNTER — Encounter: Payer: Self-pay | Admitting: Family Medicine

## 2023-12-10 VITALS — BP 114/74 | HR 88 | Temp 97.8°F | Resp 16 | Ht 69.0 in | Wt 172.2 lb

## 2023-12-10 DIAGNOSIS — I5022 Chronic systolic (congestive) heart failure: Secondary | ICD-10-CM | POA: Diagnosis not present

## 2023-12-10 DIAGNOSIS — M48061 Spinal stenosis, lumbar region without neurogenic claudication: Secondary | ICD-10-CM | POA: Diagnosis not present

## 2023-12-10 DIAGNOSIS — I209 Angina pectoris, unspecified: Secondary | ICD-10-CM | POA: Diagnosis not present

## 2023-12-10 DIAGNOSIS — I2583 Coronary atherosclerosis due to lipid rich plaque: Secondary | ICD-10-CM

## 2023-12-10 DIAGNOSIS — N1831 Chronic kidney disease, stage 3a: Secondary | ICD-10-CM

## 2023-12-10 DIAGNOSIS — G43009 Migraine without aura, not intractable, without status migrainosus: Secondary | ICD-10-CM | POA: Diagnosis not present

## 2023-12-10 DIAGNOSIS — J302 Other seasonal allergic rhinitis: Secondary | ICD-10-CM | POA: Diagnosis not present

## 2023-12-10 DIAGNOSIS — E113513 Type 2 diabetes mellitus with proliferative diabetic retinopathy with macular edema, bilateral: Secondary | ICD-10-CM | POA: Diagnosis not present

## 2023-12-10 DIAGNOSIS — I429 Cardiomyopathy, unspecified: Secondary | ICD-10-CM

## 2023-12-10 DIAGNOSIS — D649 Anemia, unspecified: Secondary | ICD-10-CM

## 2023-12-10 DIAGNOSIS — I2510 Atherosclerotic heart disease of native coronary artery without angina pectoris: Secondary | ICD-10-CM | POA: Diagnosis not present

## 2023-12-10 DIAGNOSIS — E1159 Type 2 diabetes mellitus with other circulatory complications: Secondary | ICD-10-CM | POA: Diagnosis not present

## 2023-12-10 LAB — POCT GLYCOSYLATED HEMOGLOBIN (HGB A1C): Hemoglobin A1C: 12.4 % — AB (ref 4.0–5.6)

## 2023-12-10 MED ORDER — IPRATROPIUM BROMIDE 0.03 % NA SOLN: 2.0000 | Freq: Two times a day (BID) | NASAL | 2 refills | Status: DC

## 2023-12-11 ENCOUNTER — Other Ambulatory Visit: Payer: Self-pay

## 2023-12-11 ENCOUNTER — Emergency Department
Admission: EM | Admit: 2023-12-11 | Discharge: 2023-12-11 | Disposition: A | Discharge status: Home / Self Care | Payer: Medicaid Other | Attending: Emergency Medicine | Admitting: Emergency Medicine

## 2023-12-11 DIAGNOSIS — I2510 Atherosclerotic heart disease of native coronary artery without angina pectoris: Secondary | ICD-10-CM | POA: Insufficient documentation

## 2023-12-11 DIAGNOSIS — I110 Hypertensive heart disease with heart failure: Secondary | ICD-10-CM | POA: Diagnosis not present

## 2023-12-11 DIAGNOSIS — R799 Abnormal finding of blood chemistry, unspecified: Secondary | ICD-10-CM | POA: Diagnosis present

## 2023-12-11 DIAGNOSIS — D649 Anemia, unspecified: Secondary | ICD-10-CM | POA: Diagnosis not present

## 2023-12-11 DIAGNOSIS — E1165 Type 2 diabetes mellitus with hyperglycemia: Secondary | ICD-10-CM | POA: Insufficient documentation

## 2023-12-11 DIAGNOSIS — I509 Heart failure, unspecified: Secondary | ICD-10-CM | POA: Diagnosis not present

## 2023-12-11 LAB — IRON,TIBC AND FERRITIN PANEL
%SAT: 31 % (ref 16–45)
Ferritin: 94 ng/mL (ref 16–232)
Iron: 104 ug/dL (ref 45–160)
TIBC: 339 ug/dL (ref 250–450)

## 2023-12-11 LAB — CBC WITH DIFFERENTIAL/PLATELET
Abs Immature Granulocytes: 0.06 10*3/uL (ref 0.00–0.07)
Absolute Lymphocytes: 1219 {cells}/uL (ref 850–3900)
Absolute Monocytes: 613 {cells}/uL (ref 200–950)
Basophils Absolute: 58 {cells}/uL (ref 0–200)
Basophils Absolute: 0 10*3/uL (ref 0.0–0.1)
Basophils Relative: 0.8 %
Basophils Relative: 1 %
Eosinophils Absolute: 190 {cells}/uL (ref 15–500)
Eosinophils Absolute: 0.2 10*3/uL (ref 0.0–0.5)
Eosinophils Relative: 2.6 %
Eosinophils Relative: 3 %
HCT: 34 % — ABNORMAL LOW (ref 35.0–45.0)
HCT: 33.5 % — ABNORMAL LOW (ref 36.0–46.0)
Hemoglobin: 10.6 g/dL — ABNORMAL LOW (ref 11.7–15.5)
Hemoglobin: 10.8 g/dL — ABNORMAL LOW (ref 12.0–15.0)
Immature Granulocytes: 1 %
Lymphocytes Relative: 22 %
Lymphs Abs: 1.7 10*3/uL (ref 0.7–4.0)
MCH: 28.3 pg (ref 27.0–33.0)
MCH: 28.4 pg (ref 26.0–34.0)
MCHC: 31.2 g/dL — ABNORMAL LOW (ref 32.0–36.0)
MCHC: 32.2 g/dL (ref 30.0–36.0)
MCV: 90.9 fL (ref 80.0–100.0)
MCV: 88.2 fL (ref 80.0–100.0)
MPV: 10 fL (ref 7.5–12.5)
Monocytes Absolute: 0.5 10*3/uL (ref 0.1–1.0)
Monocytes Relative: 8.4 %
Monocytes Relative: 6 %
Neutro Abs: 5220 {cells}/uL (ref 1500–7800)
Neutro Abs: 5.2 10*3/uL (ref 1.7–7.7)
Neutrophils Relative %: 71.5 %
Neutrophils Relative %: 67 %
Platelets: 330 10*3/uL (ref 140–400)
Platelets: 321 10*3/uL (ref 150–400)
RBC: 3.74 10*6/uL — ABNORMAL LOW (ref 3.80–5.10)
RBC: 3.8 MIL/uL — ABNORMAL LOW (ref 3.87–5.11)
RDW: 11.8 % (ref 11.0–15.0)
RDW: 12.4 % (ref 11.5–15.5)
Total Lymphocyte: 16.7 %
WBC: 7.3 10*3/uL (ref 3.8–10.8)
WBC: 7.7 10*3/uL (ref 4.0–10.5)
nRBC: 0 % (ref 0.0–0.2)

## 2023-12-11 LAB — COMPLETE METABOLIC PANEL WITH GFR
AG Ratio: 1.5 (calc) (ref 1.0–2.5)
ALT: 15 U/L (ref 6–29)
AST: 19 U/L (ref 10–35)
Albumin: 4.5 g/dL (ref 3.6–5.1)
Alkaline phosphatase (APISO): 79 U/L (ref 37–153)
BUN/Creatinine Ratio: 26 (calc) — ABNORMAL HIGH (ref 6–22)
BUN: 29 mg/dL — ABNORMAL HIGH (ref 7–25)
CO2: 23 mmol/L (ref 20–32)
Calcium: 9.8 mg/dL (ref 8.6–10.4)
Chloride: 101 mmol/L (ref 98–110)
Creat: 1.12 mg/dL — ABNORMAL HIGH (ref 0.50–1.03)
Globulin: 3.1 g/dL (ref 1.9–3.7)
Glucose, Bld: 393 mg/dL — ABNORMAL HIGH (ref 65–99)
Potassium: 6.6 mmol/L (ref 3.5–5.3)
Sodium: 132 mmol/L — ABNORMAL LOW (ref 135–146)
Total Bilirubin: 0.5 mg/dL (ref 0.2–1.2)
Total Protein: 7.6 g/dL (ref 6.1–8.1)
eGFR: 58 mL/min/{1.73_m2} — ABNORMAL LOW (ref >=60)

## 2023-12-11 LAB — BASIC METABOLIC PANEL
Anion gap: 11 (ref 5–15)
BUN: 33 mg/dL — ABNORMAL HIGH (ref 6–20)
CO2: 19 mmol/L — ABNORMAL LOW (ref 22–32)
Calcium: 9.3 mg/dL (ref 8.9–10.3)
Chloride: 101 mmol/L (ref 98–111)
Creatinine, Ser: 1.26 mg/dL — ABNORMAL HIGH (ref 0.44–1.00)
GFR, Estimated: 51 mL/min — ABNORMAL LOW (ref >60)
Glucose, Bld: 371 mg/dL — ABNORMAL HIGH (ref 70–99)
Potassium: 5.5 mmol/L — ABNORMAL HIGH (ref 3.5–5.1)
Sodium: 131 mmol/L — ABNORMAL LOW (ref 135–145)

## 2023-12-11 LAB — B12 AND FOLATE PANEL
Folate: 9.7 ng/mL
Vitamin B-12: 578 pg/mL (ref 200–1100)

## 2023-12-11 NOTE — ED Provider Notes (Signed)
[facility] Provider Note    Event Date/Time   First MD Initiated Contact with Patient 12/11/23 1701     (approximate)   History   Chief Complaint: Abnormal labs   HPI  Sarah Duffy is a 52 y.o. female with a history of CAD, heart failure, diabetes, hypertension who comes the ED for evaluation of abnormal labs.  Saw her PCP yesterday for routine follow-up, and outpatient labs drawn yesterday resulted with a potassium of 6.6 so patient was sent to ED for evaluation.  She denies any acute symptoms.  She does report that the phlebotomist was having a hard time finding a vein for lab draw and tourniquet may have been on the arm for prolonged period of time.          Physical Exam   Triage Vital Signs: ED Triage Vitals  Encounter Vitals Group     BP 12/11/23 1406 115/74     Systolic BP Percentile --      Diastolic BP Percentile --      Pulse Rate 12/11/23 1406 91     Resp 12/11/23 1406 17     Temp 12/11/23 1406 98.1 F (36.7 C)     Temp Source 12/11/23 1406 Oral     SpO2 12/11/23 1406 97 %     Weight 12/11/23 1407 176 lb (79.8 kg)     Height 12/11/23 1407 5\' 9"  (1.753 m)     Head Circumference --      Peak Flow --      Pain Score 12/11/23 1407 0     Pain Loc --      Pain Education --      Exclude from Growth Chart --     Most recent vital signs: Vitals:   12/11/23 1715 12/11/23 1730  BP:  115/67  Pulse:  73  Resp:  16  Temp: 98.2 F (36.8 C)   SpO2:  100%    General: Awake, no distress.  CV:  Good peripheral perfusion.  Regular rate rhythm Resp:  Normal effort.  Abd:  No distention.  Other:  Moist oral mucosa   ED Results / Procedures / Treatments   Labs (all labs ordered are listed, but only abnormal results are displayed) Labs Reviewed  BASIC METABOLIC PANEL - Abnormal; Notable for the following components:      Result Value   Sodium 131 (*)    Potassium 5.5 (*)    CO2 19 (*)    Glucose, Bld 371 (*)    BUN 33 (*)     Creatinine, Ser 1.26 (*)    GFR, Estimated 51 (*)    All other components within normal limits  CBC WITH DIFFERENTIAL/PLATELET - Abnormal; Notable for the following components:   RBC 3.80 (*)    Hemoglobin 10.8 (*)    HCT 33.5 (*)    All other components within normal limits     EKG Interpreted by me Sinus rhythm rate of 83.  Left axis, normal intervals.  Poor R wave progression.  Normal ST segments and T waves   RADIOLOGY    PROCEDURES:  Procedures   MEDICATIONS ORDERED IN ED: Medications - No data to display   IMPRESSION / MDM / ASSESSMENT AND PLAN / ED COURSE  I reviewed the triage vital signs and the nursing notes.  DDx: Lab error, hyperglycemia, AKI, hemolysis  Patient's presentation is most consistent with acute presentation with potential threat to life or bodily function.  Patient sent  to the ED for possible hyperkalemia.  She is asymptomatic, baseline state of health, vital signs are normal, exam is benign.  Labs here are at baseline with mild anemia, mild CKD with hyperglycemia as is typical for her.  Potassium is 5.5.  Counseled on hydration.  No further interventions needed at this time.  Stable for discharge       FINAL CLINICAL IMPRESSION(S) / ED DIAGNOSES   Final diagnoses:  Type 2 diabetes mellitus with hyperglycemia, without long-term current use of insulin (HCC)     Rx / DC Orders   ED Discharge Orders     None        Note:  This document was prepared using Dragon voice recognition software and may include unintentional dictation errors.   Sharman Cheek, MD 12/11/23 2322

## 2023-12-11 NOTE — Discharge Instructions (Signed)
Your potassium level today was found to be within the normal range.  Continue taking all of your medications as prescribed.

## 2023-12-11 NOTE — ED Triage Notes (Signed)
Pt sent from PCP for abnormal labs - potassium levels extremely high.

## 2023-12-14 ENCOUNTER — Encounter (INDEPENDENT_AMBULATORY_CARE_PROVIDER_SITE_OTHER): Payer: Medicaid Other | Admitting: Ophthalmology

## 2023-12-14 ENCOUNTER — Ambulatory Visit: Payer: Self-pay | Admitting: *Deleted

## 2023-12-14 NOTE — Telephone Encounter (Signed)
  Chief Complaint: See notes regarding ED visit for elevated potassium.    Symptoms: None Frequency: N/A Pertinent Negatives: Patient denies chest pain, shortness of breath, fluttering in her chest, sweating, feeling faint or dizzy.   Disposition: [] ED /[] Urgent Care (no appt availability in office) / [] Appointment(In office/virtual)/ []  [facility]/ [] Home Care/ [] Refused Recommended Disposition /[] [place] [facility]/ [x]  Follow-up with PCP Additional Notes: Message sent to Dr. Glenard so she would know how pt's ED visit went when the office reopens on Thursday morning from the New Year's holiday.

## 2023-12-14 NOTE — Telephone Encounter (Signed)
 Message from Sarah Duffy sent at 12/14/2023  1:33 PM EST  Summary: Pt requests to speak with a nurse regarding elevated potassium   Pt requests to speak with a nurse regarding elevated potassium. Cb# [phone]          Call History  Contact Date/Time Type Contact Phone/Fax By  12/14/2023 01:32 PM EST Phone (Incoming) Gini, Caputo (Self) [phone] (Duffy) Lenora Rouse   Reason for Disposition  Health Information question, no triage required and triager able to answer question  Answer Assessment - Initial Assessment Questions 1. REASON FOR CALL or QUESTION: What is your reason for calling today? or How can I best help you? or What question do you have that I can help answer?     Last Friday my K was high so Dr. Glenard told me to go to the ED.   I went to ED.   They did my blood work there and said it was fine and I didn't need an IV.   My doctor  (Dr. Glenard) told me to have an IV so I told the ED doctor my doctor said I needed an IV.    They did an EKG and it was fine.     The doctor in the ED said it could be from the problem drawing the blood but that based on my blood work in the ED I didn't need to have an IV.    He told me to follow up with my PCP on Monday.    I told the ED doctor I had to have an appt.    I couldn't just go into my PCP's office.  They would not give me an IV.   I'Duffy not sure what I need to do.  (Looking in her chart her potassium was 6.6 that was drawn in the office.   Pt. Denies the lab tech having a problem drawing her blood.   Her potassium in the ED was 5.5.   Range 3.5 - 5.1 so I let pt know if the ED doctor did not determine she needed the IV after rechecking her blood work and doing an EKG it was ok to abide by his evaluation since he rechecked everything).   I gave her precautions to go to the ED if they occurred:  chest pain, fluttering in her chest, shortness of breath, sweating, feeling faint or dizzy to go to the ED right away.   I told her to  avoid bananas and orange juice due to their high potassium content.   She denies being started on any new medications that would affect her potassium level.      She was agreeable to this plan and thanked me for answering her call.   (Practice is closed for New Year's Eve).    I let her know I would leave a message for Dr Glenard so she would know what happened in regards to pt's ED visit.   We would be open Thursday morning.  Protocols used: Information Only Call - No Triage-A-AH

## 2023-12-16 NOTE — Telephone Encounter (Signed)
 Pt went to ER on 12/11/23

## 2023-12-17 ENCOUNTER — Other Ambulatory Visit: Payer: Self-pay | Admitting: Nurse Practitioner

## 2023-12-17 ENCOUNTER — Ambulatory Visit: Payer: Medicaid Other | Admitting: Dietician

## 2023-12-17 ENCOUNTER — Ambulatory Visit: Payer: Medicaid Other | Admitting: Family Medicine

## 2023-12-17 DIAGNOSIS — J4520 Mild intermittent asthma, uncomplicated: Secondary | ICD-10-CM

## 2023-12-17 DIAGNOSIS — M5416 Radiculopathy, lumbar region: Secondary | ICD-10-CM | POA: Diagnosis not present

## 2023-12-20 NOTE — Telephone Encounter (Signed)
 Requested Prescriptions  Pending Prescriptions Disp Refills   VENTOLIN  HFA 108 (90 Base) MCG/ACT inhaler [Pharmacy Med Name: VENTOLIN  HFA 90 MCG INHALER] 18 each 1    Sig: INHALE 2 PUFFS BY MOUTH EVERY 4 HOURS AS NEEDED FOR WHEEZE OR FOR SHORTNESS OF BREATH     Pulmonology:  Beta Agonists 2 Passed - 12/20/2023  3:27 PM      Passed - Last BP in normal range    BP Readings from Last 1 Encounters:  12/11/23 115/67         Passed - Last Heart Rate in normal range    Pulse Readings from Last 1 Encounters:  12/11/23 73         Passed - Valid encounter within last 12 months    Recent Outpatient Visits           1 week ago Angina pectoris associated with type 2 diabetes mellitus [facility])   [place] [facility] Glenard Mire, MD   1 month ago Chronic kidney disease, stage 3a [facility])   [place] [facility] Glen[facility] Mire, MD   2 months ago Weakness of extremity   [place] [facility] Mecum, Rocky BRAVO, PA-C   3 months ago Weakness of extremity   [place] [facility] Mecum, Rocky BRAVO, PA-C   4 months ago Chronic systolic heart failure [facility])   [place] [facility] [name], Krichna, MD       Future Appointments             In 1 week [name], Krichna, MD [facility], [facility]   In 1 month Gerard Frederick, NP  [facility] [place]   In 3 months [name], Krichna, MD [facility], [facility]

## 2023-12-21 ENCOUNTER — Encounter (INDEPENDENT_AMBULATORY_CARE_PROVIDER_SITE_OTHER): Payer: Medicaid Other | Admitting: Ophthalmology

## 2023-12-21 DIAGNOSIS — Z7984 Long term (current) use of oral hypoglycemic drugs: Secondary | ICD-10-CM

## 2023-12-21 DIAGNOSIS — E113513 Type 2 diabetes mellitus with proliferative diabetic retinopathy with macular edema, bilateral: Secondary | ICD-10-CM

## 2023-12-21 DIAGNOSIS — H25813 Combined forms of age-related cataract, bilateral: Secondary | ICD-10-CM

## 2023-12-21 DIAGNOSIS — H35033 Hypertensive retinopathy, bilateral: Secondary | ICD-10-CM

## 2023-12-21 DIAGNOSIS — Z794 Long term (current) use of insulin: Secondary | ICD-10-CM

## 2023-12-21 DIAGNOSIS — H4311 Vitreous hemorrhage, right eye: Secondary | ICD-10-CM

## 2023-12-21 DIAGNOSIS — I10 Essential (primary) hypertension: Secondary | ICD-10-CM

## 2023-12-27 ENCOUNTER — Other Ambulatory Visit: Payer: Medicaid Other

## 2023-12-27 ENCOUNTER — Encounter: Payer: Self-pay | Admitting: Family Medicine

## 2023-12-27 ENCOUNTER — Ambulatory Visit (INDEPENDENT_AMBULATORY_CARE_PROVIDER_SITE_OTHER): Payer: Medicaid Other | Admitting: Family Medicine

## 2023-12-27 VITALS — BP 126/68 | HR 86 | Resp 16 | Ht 69.0 in | Wt 184.0 lb

## 2023-12-27 DIAGNOSIS — E875 Hyperkalemia: Secondary | ICD-10-CM | POA: Diagnosis not present

## 2023-12-27 DIAGNOSIS — R0683 Snoring: Secondary | ICD-10-CM | POA: Diagnosis not present

## 2023-12-27 DIAGNOSIS — R739 Hyperglycemia, unspecified: Secondary | ICD-10-CM

## 2023-12-27 DIAGNOSIS — E118 Type 2 diabetes mellitus with unspecified complications: Secondary | ICD-10-CM

## 2023-12-27 DIAGNOSIS — R400 Somnolence: Secondary | ICD-10-CM

## 2023-12-27 NOTE — Progress Notes (Signed)
 Name: Sarah Duffy   MRN: [id number]    DOB: [birth year]   Date:12/27/2023       Progress Note  Subjective  Chief Complaint  Chief Complaint  Patient presents with   Follow-up    ED, due to potassium    HPI  EC follow up for hyperkalemia: level was up to 6.6 result arrived on Saturday morning on Dec 28 th so we contacted patient and [facility], repeat level was 5.5 . Discussed causes of hyperkalemia such as hemolysis , also could be hyperglycemia. Advised to stay hydrated   Diabetes out of control with last A1C of 12.4 %  she missed a visit with Endo and is going to see Endo in a couple of weeks to discuss medication management, she states she has not been very compliant with her diet lately. She states she has been compliant with medications, she is working two jobs and usually eats a healthy breakfast , only snacks - carbohydrate heavy due to lack of time. Eats a junior [employer], fries. Advised to switch to chilli - reminded her to use insulin  before each meal. At night she is not very hungry and is eating a bowel of cereal. Discussed chicken, steamed veggies , soup at night - look at carb content and use insulin  before each meal  Snoring: daytime somnolence, gasps of air during the night, wakes herself up while asleep. ESS today was  17   Patient Active Problem List   Diagnosis Date Noted   Gait abnormality 11/30/2023   Spinal stenosis of lumbar region 11/30/2023   Chronic kidney disease, stage 3a (HCC) 11/09/2023   Weakness of extremity 09/20/2023   Arthropathy of lumbar facet joint 07/29/2023   Low back pain 07/29/2023   Pain in finger of left hand 07/29/2023   Sacroiliac joint pain 07/29/2023   Contact dermatitis and eczema 06/08/2023   Dysphagia 03/23/2023   Asthma, well controlled 03/09/2023   Depression, major, in remission (HCC) 03/09/2023   Perennial allergic rhinitis with seasonal variation 03/09/2023   Type 2 diabetes mellitus with diabetic polyneuropathy,  with long-term current use of insulin  (HCC) 08/31/2022   Type 2 diabetes mellitus with retinopathy, with long-term current use of insulin  (HCC) 08/31/2022   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(HCC) 08/31/2022   Hypertension 06/11/2022   Ischemic cardiomyopathy 06/11/2022   Coronary artery disease 06/11/2022   Chronic systolic heart failure (HCC) 06/11/2022   Angina pectoris associated with type 2 diabetes mellitus (HCC) 01/05/2022   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 edema associated with type 2 diabetes mellitus (HCC) 05/01/2021   Cardiomyopathy (HCC) 04/14/2021   History of 2019 novel coronavirus disease (COVID-19) 06/23/2019   Chronic migraine w/o aura w/o status migrainosus, not intractable 01/14/2018   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 associated with type 2 diabetes mellitus (HCC) 05/21/2016   Hyperlipidemia LDL goal <70 03/10/2016   MI (mitral incompetence) 02/25/2016   Insomnia 02/25/2016   Anxiety 09/04/2014    Past Surgical History:  Procedure Laterality Date   CHOLECYSTECTOMY  1999   COLONOSCOPY WITH PROPOFOL  N/A 03/25/2021   Procedure: COLONOSCOPY WITH PROPOFOL ;  Surgeon: Therisa Bi, MD;  [facility]: [facility] ENDOSCOPY;  Service: Gastroenterology;  Laterality: N/A;   COLONOSCOPY WITH PROPOFOL  N/A 04/17/2021   Procedure: COLONOSCOPY WITH PROPOFOL ;  Surgeon: Therisa Bi, MD;  Location: [facility] ENDOSCOPY;  Service: Gastroenterology;  Laterality: N/A;   ESOPHAGOGASTRODUODENOSCOPY (EGD) WITH PROPOFOL  N/A 03/25/2021   Procedure: ESOPHAGOGASTRODUODENOSCOPY (EGD) WITH PROPOFOL ;  Surgeon: Therisa Bi, MD;  Location: [facility] ENDOSCOPY;  Service: Gastroenterology;  Laterality: N/A;   ESOPHAGOGASTRODUODENOSCOPY (EGD) WITH PROPOFOL  N/A 04/17/2021   Procedure: ESOPHAGOGASTRODUODENOSCOPY (EGD) WITH PROPOFOL ;  Surgeon: Therisa Bi, MD;  Location: [facility] END[facility]COPY;  Service: Gastroenterology;  Laterality: N/A;   ESOPHAGOGASTRODUODENOSCOPY  (EGD) WITH PROPOFOL  N/A 03/23/2023   Procedure: ESOPHAGOGASTRODUODENOSCOPY (EGD) WITH PROPOFOL ;  Surgeon: Therisa Bi, MD;  Location: [facility] ENDOSCOPY;  Service: Gastroenterology;  Laterality: N/A;   RIGHT/LEFT HEART CATH AND CORONARY ANGIOGRAPHY Bilateral 04/14/2021   Procedure: RIGHT/LEFT HEART CATH AND CORONARY ANGIOGRAPHY;  Surgeon: Mady Bruckner, MD;  Location: [facility] INVASIVE CV LAB;  Service: Cardiovascular;  Laterality: Bilateral;   TOE SURGERY Left 02/07/2016   Pinky Toe    Family History  Problem Relation Age of Onset   Diabetes Mother    Ulcers Mother    Heart disease Father    AAA (abdominal aortic aneurysm) Father    Diabetes Father    Hypertension Father    Stroke Father    Alzheimer's disease Father    Heart attack Sister    Seizures Brother    Diabetes Maternal Grandmother    Breast cancer Maternal Grandmother     Social History   Tobacco Use   Smoking status: Never   Smokeless tobacco: Never  Substance Use Topics   Alcohol use: No    Alcohol/week: 0.0 standard drinks of alcohol     Current Outpatient Medications:    amLODipine  (NORVASC ) 5 MG tablet, Take 1 tablet (5 mg total) by mouth daily., Disp: 90 tablet, Rfl: 1   atorvastatin  (LIPITOR) 80 MG tablet, Take 1 tablet (80 mg total) by mouth daily., Disp: 90 tablet, Rfl: 1   Blood Glucose Monitoring Suppl (ACCU-CHEK GUIDE) w/Device KIT, Use to check blood sugar 2 time daily as needed, Disp: 1 kit, Rfl: 0   carvedilol  (COREG ) 12.5 MG tablet, TAKE 1 TABLET BY MOUTH 2 TIMES DAILY., Disp: 180 tablet, Rfl: 0   Continuous Glucose Sensor (DEXCOM G7 SENSOR) MISC, 1 DEVICE BY DOES NOT APPLY ROUTE AS DIRECTED., Disp: 3 each, Rfl: 11   ENTRESTO  24-26 MG, Take 1 tablet by mouth 2 (two) times daily., Disp: 60 tablet, Rfl: 3   ezetimibe  (ZETIA ) 10 MG tablet, Take 1 tablet (10 mg total) by mouth daily., Disp: 90 tablet, Rfl: 1   glucose blood (ACCU-CHEK GUIDE) test strip, Check blood sugar 2 times daily as needed, Disp: 100  each, Rfl: 12   insulin  aspart (NOVOLOG  FLEXPEN) 100 UNIT/ML FlexPen, Max daily 30 units, Disp: 30 mL, Rfl: 3   Insulin  Glargine (BASAGLAR  KWIKPEN) 100 UNIT/ML, Inject 70 Units into the skin daily., Disp: 60 mL, Rfl: 3   Insulin  Pen Needle 32G X 4 MM MISC, 1 Device by Does not apply route in the morning, at noon, in the evening, and at bedtime., Disp: 400 each, Rfl: 3   ipratropium (ATROVENT ) 0.03 % nasal spray, Place 2 sprays into both nostrils 2 (two) times daily., Disp: 30 mL, Rfl: 2   JARDIANCE  25 MG TABS tablet, Take 1 tablet (25 mg total) by mouth daily., Disp: 90 tablet, Rfl: 2   Lancets (ACCU-CHEK MULTICLIX) lancets, Check sugar 2 times daily as needed., Disp: 100 each, Rfl: 12   levocetirizine (XYZAL ) 5 MG tablet, Take 1 tablet (5 mg total) by mouth daily at 2 PM., Disp: 90 tablet, Rfl: 1   metaxalone  (SKELAXIN ) 800 MG tablet, TAKE 1 TABLET (800 MG TOTAL) BY MOUTH 3 (THREE) TIMES DAILY AS NEEDED FOR MUSCLE SPASMS., Disp:  90 tablet, Rfl: 0   naproxen  (NAPROSYN ) 500 MG tablet, , Disp: , Rfl:    nitroGLYCERIN  (NITROSTAT ) 0.4 MG SL tablet, PLACE 1 TABLET UNDER THE TONGUE EVERY 5 MINUTES AS NEEDED FOR CHEST PAIN., Disp: 25 tablet, Rfl: 2   OZEMPIC , 0.25 OR 0.5 MG/DOSE, 2 MG/3ML SOPN, , Disp: , Rfl:    prednisoLONE  acetate (PRED FORTE ) 1 % ophthalmic suspension, Place 1 drop into the left eye 4 (four) times daily., Disp: 10 mL, Rfl: 0   pregabalin  (LYRICA ) 75 MG capsule, Take 75 mg by mouth 3 (three) times daily., Disp: , Rfl:    Rimegepant Sulfate  (NURTEC) 75 MG TBDP, Take 1 tablet (75 mg total) by mouth daily as needed. Max of every other day prn, Disp: 16 tablet, Rfl: 0   spironolactone  (ALDACTONE ) 25 MG tablet, Take 1 tablet (25 mg total) by mouth daily., Disp: 90 tablet, Rfl: 0   triamcinolone  cream (KENALOG ) 0.1 %, Apply 1 Application topically 2 (two) times daily., Disp: 453.6 g, Rfl: 0   VENTOLIN  HFA 108 (90 Base) MCG/ACT inhaler, INHALE 2 PUFFS BY MOUTH EVERY 4 HOURS AS NEEDED FOR WHEEZE  OR FOR SHORTNESS OF BREATH, Disp: 18 each, Rfl: 1   pregabalin  (LYRICA ) 50 MG capsule, Take 1 capsule (50 mg total) by mouth 3 (three) times daily. (Patient not taking: Reported on 12/27/2023), Disp: 90 capsule, Rfl: 0  No Known Allergies  I personally reviewed active problem list, medication list, allergies, family history with the patient/caregiver today.   ROS  Ten systems reviewed and is negative except as mentioned in HPI    Objective  Vitals:   12/27/23 1537  BP: 126/68  Pulse: 86  Resp: 16  SpO2: 99%  Weight: 184 lb (83.5 kg)  Height: 5' 9 (1.753 m)    Body mass index is 27.17 kg/m.  Physical Exam  Constitutional: Patient appears well-developed and well-nourished. No distress.  HEENT: head atraumatic, normocephalic, pupils equal and reactive to light, neck supple Cardiovascular: Normal rate, regular rhythm and normal heart sounds.  No murmur heard. No BLE edema. Pulmonary/Chest: Effort normal and breath sounds normal. No respiratory distress. Abdominal: Soft.  There is no tenderness. Psychiatric: Patient has a normal mood and affect. behavior is normal. Judgment and thought content normal.   Recent Results (from the past 2160 hours)  Direct LDL     Status: None   Collection Time: 11/02/23  9:53 AM  Result Value Ref Range   LDL Direct 72 0 - 99 mg/dL  Basic Metabolic Panel (BMET)     Status: Abnormal   Collection Time: 11/02/23  9:53 AM  Result Value Ref Range   Glucose 338 (H) 70 - 99 mg/dL   BUN 23 6 - 24 mg/dL   Creatinine, Ser 8.78 (H) 0.57 - 1.00 mg/dL   eGFR 54 (L) >40 fO/fpw/8.26   BUN/Creatinine Ratio 19 9 - 23   Sodium 138 134 - 144 mmol/L   Potassium 5.0 3.5 - 5.2 mmol/L   Chloride 104 96 - 106 mmol/L   CO2 20 20 - 29 mmol/L   Calcium  8.9 8.7 - 10.2 mg/dL  CBC     Status: Abnormal   Collection Time: 11/02/23  9:53 AM  Result Value Ref Range   WBC 8.2 3.4 - 10.8 x10E3/uL    Comment: **Effective November 15, 2023 profile 005025 WBC will be  made**   non-orderable as a stand-alone order code.    RBC 3.72 (L) 3.77 - 5.28 x10E6/uL   Hemoglobin  10.6 (L) 11.1 - 15.9 g/dL   Hematocrit 65.4 65.9 - 46.6 %   MCV 93 79 - 97 fL   MCH 28.5 26.6 - 33.0 pg   MCHC 30.7 (L) 31.5 - 35.7 g/dL   RDW 87.3 88.2 - 84.5 %   Platelets 289 150 - 450 x10E3/uL  POCT HgB A1C     Status: Abnormal   Collection Time: 12/10/23  1:34 PM  Result Value Ref Range   Hemoglobin A1C 12.4 (A) 4.0 - 5.6 %   HbA1c POC (<> result, manual entry)     HbA1c, POC (prediabetic range)     HbA1c, POC (controlled diabetic range)    CBC with Differential/Platelet     Status: Abnormal   Collection Time: 12/10/23  2:06 PM  Result Value Ref Range   WBC 7.3 3.8 - 10.8 Thousand/uL   RBC 3.74 (L) 3.80 - 5.10 Million/uL   Hemoglobin 10.6 (L) 11.7 - 15.5 g/dL   HCT 65.9 (L) 64.9 - 54.9 %   MCV 90.9 80.0 - 100.0 fL   MCH 28.3 27.0 - 33.0 pg   MCHC 31.2 (L) 32.0 - 36.0 g/dL    Comment: For adults, a slight decrease in the calculated MCHC value (in the range of 30 to 32 g/dL) is most likely not clinically significant; however, it should be interpreted with caution in correlation with other red cell parameters and the patient's clinical condition.    RDW 11.8 11.0 - 15.0 %   Platelets 330 140 - 400 Thousand/uL   MPV 10.0 7.5 - 12.5 fL   Neutro Abs 5,220 1,500 - 7,800 cells/uL   Absolute Lymphocytes 1,219 850 - 3,900 cells/uL   Absolute Monocytes 613 200 - 950 cells/uL   Eosinophils Absolute 190 15 - 500 cells/uL   Basophils Absolute 58 0 - 200 cells/uL   Neutrophils Relative % 71.5 %   Total Lymphocyte 16.7 %   Monocytes Relative 8.4 %   Eosinophils Relative 2.6 %   Basophils Relative 0.8 %  Iron, TIBC and Ferritin Panel     Status: None   Collection Time: 12/10/23  2:06 PM  Result Value Ref Range   Iron 104 45 - 160 mcg/dL   TIBC 660 749 - 549 mcg/dL (calc)   %SAT 31 16 - 45 % (calc)   Ferritin 94 16 - 232 ng/mL  B12 and Folate Panel     Status: None    Collection Time: 12/10/23  2:06 PM  Result Value Ref Range   Vitamin B-12 578 200 - 1,100 pg/mL   Folate 9.7 ng/mL    Comment:                            Reference Range                            Low:           <3.4                            Borderline:    3.4-5.4                            Normal:        >5.4 .   COMPLETE METABOLIC PANEL WITH GFR     Status: Abnormal  Collection Time: 12/10/23  2:06 PM  Result Value Ref Range   Glucose, Bld 393 (H) 65 - 99 mg/dL    Comment: .            Fasting reference interval . For someone without known diabetes, a glucose value >125 mg/dL indicates that they may have diabetes and this should be confirmed with a follow-up test. .    BUN 29 (H) 7 - 25 mg/dL   Creat 8.87 (H) 9.49 - 1.03 mg/dL   eGFR 58 (L) > OR = 60 mL/min/1.39m2   BUN/Creatinine Ratio 26 (H) 6 - 22 (calc)   Sodium 132 (L) 135 - 146 mmol/L   Potassium 6.6 (HH) 3.5 - 5.3 mmol/L    Comment: Verified by repeat analysis. .    Chloride 101 98 - 110 mmol/L   CO2 23 20 - 32 mmol/L   Calcium  9.8 8.6 - 10.4 mg/dL   Total Protein 7.6 6.1 - 8.1 g/dL   Albumin 4.5 3.6 - 5.1 g/dL   Globulin 3.1 1.9 - 3.7 g/dL (calc)   AG Ratio 1.5 1.0 - 2.5 (calc)   Total Bilirubin 0.5 0.2 - 1.2 mg/dL   Alkaline phosphatase (APISO) 79 37 - 153 U/L   AST 19 10 - 35 U/L   ALT 15 6 - 29 U/L  Basic metabolic panel     Status: Abnormal   Collection Time: 12/11/23  2:16 PM  Result Value Ref Range   Sodium 131 (L) 135 - 145 mmol/L   Potassium 5.5 (H) 3.5 - 5.1 mmol/L   Chloride 101 98 - 111 mmol/L   CO2 19 (L) 22 - 32 mmol/L   Glucose, Bld 371 (H) 70 - 99 mg/dL    Comment: Glucose reference range applies only to samples taken after fasting for at least 8 hours.   BUN 33 (H) 6 - 20 mg/dL   Creatinine, Ser 8.73 (H) 0.44 - 1.00 mg/dL   Calcium  9.3 8.9 - 10.3 mg/dL   GFR, Estimated 51 (L) >60 mL/min    Comment: (NOTE) Calculated using the CKD-EPI Creatinine Equation (2021)    Anion gap 11 5 -  15    Comment: Performed at [facility], [address] [place], [place] [address]  CBC with Differential     Status: Abnormal   Collection Time: 12/11/23  2:16 PM  Result Value Ref Range   WBC 7.7 4.0 - 10.5 K/uL   RBC 3.80 (L) 3.87 - 5.11 MIL/uL   Hemoglobin 10.8 (L) 12.0 - 15.0 g/dL   HCT 66.4 (L) 63.9 - 53.9 %   MCV 88.2 80.0 - 100.0 fL   MCH 28.4 26.0 - 34.0 pg   MCHC 32.2 30.0 - 36.0 g/dL   RDW 87.5 88.4 - 84.4 %   Platelets 321 150 - 400 K/uL   nRBC 0.0 0.0 - 0.2 %   Neutrophils Relative % 67 %   Neutro Abs 5.2 1.7 - 7.7 K/uL   Lymphocytes Relative 22 %   Lymphs Abs 1.7 0.7 - 4.0 K/uL   Monocytes Relative 6 %   Monocytes Absolute 0.5 0.1 - 1.0 K/uL   Eosinophils Relative 3 %   Eosinophils Absolute 0.2 0.0 - 0.5 K/uL   Basophils Relative 1 %   Basophils Absolute 0.0 0.0 - 0.1 K/uL   Immature Granulocytes 1 %   Abs Immature Granulocytes 0.06 0.00 - 0.07 K/uL    Comment: Performed at [facility], [address] [place],  [place] [address]    Diabetic Foot Exam:     PHQ2/9:    12/27/2023    3:30 PM 12/10/2023    1:26 PM 11/09/2023    7:42 AM 10/15/2023    2:32 PM 10/04/2023   10:24 AM  Depression screen PHQ 2/9  Decreased Interest 0 0 0 0 0  Down, Depressed, Hopeless 0 0 0 0 0  PHQ - 2 Score 0 0 0 0 0  Altered sleeping 0 0 0  0  Tired, decreased energy 0 0 0  0  Change in appetite 0 0 0  0  Feeling bad or failure about yourself  0 0 0  0  Trouble concentrating 0 0 0  0  Moving slowly or fidgety/restless 0 0 0  0  Suicidal thoughts 0 0 0  0  PHQ-9 Score 0 0 0  0  Difficult doing work/chores Not difficult at all    Not difficult at all    phq 9 is negative   Fall Risk:    12/27/2023    3:30 PM 11/09/2023    7:42 AM 10/15/2023    2:32 PM 10/04/2023   10:24 AM 09/20/2023    9:34 AM  Fall Risk   Falls in the past year? 0 0 0 0 0  Number falls in past yr: 0 0  0 0  Injury with Fall? 0 0  0 0  Risk for fall due to : No Fall Risks No  Fall Risks  No Fall Risks No Fall Risks  Follow up Falls prevention discussed;Education provided;Falls evaluation completed Falls prevention discussed  Falls prevention discussed;Education provided;Falls evaluation completed Falls prevention discussed;Education provided;Falls evaluation completed      Assessment & Plan  1. Type 2 diabetes mellitus with complications (HCC) (Primary)  Discussed dietary modification, use of pre meal insulin  and importance of following up with Endo. She will not be cleared for back or open heart surgery with uncontrolled DM due to risk of complications   - pregabalin  (LYRICA ) 75 MG capsule; Take 75 mg by mouth 3 (three) times daily.  2. Hyperglycemia  See above  3. Snoring  Boyfriend states so loud that he goes to living room to sleep - Ambulatory referral to Sleep Studies  4. Daytime somnolence  - Ambulatory referral to Sleep Studies ESS of 17  5. Hyperkalemia  Resolved, discussed importance of DM control and hydration

## 2023-12-28 ENCOUNTER — Other Ambulatory Visit: Payer: Self-pay | Admitting: Family Medicine

## 2023-12-28 ENCOUNTER — Other Ambulatory Visit: Payer: Self-pay | Admitting: Physician Assistant

## 2023-12-28 ENCOUNTER — Encounter (INDEPENDENT_AMBULATORY_CARE_PROVIDER_SITE_OTHER): Payer: Medicaid Other | Admitting: Ophthalmology

## 2023-12-28 DIAGNOSIS — H4311 Vitreous hemorrhage, right eye: Secondary | ICD-10-CM

## 2023-12-28 DIAGNOSIS — H35033 Hypertensive retinopathy, bilateral: Secondary | ICD-10-CM

## 2023-12-28 DIAGNOSIS — G8929 Other chronic pain: Secondary | ICD-10-CM

## 2023-12-28 DIAGNOSIS — E113513 Type 2 diabetes mellitus with proliferative diabetic retinopathy with macular edema, bilateral: Secondary | ICD-10-CM

## 2023-12-28 DIAGNOSIS — Z7984 Long term (current) use of oral hypoglycemic drugs: Secondary | ICD-10-CM

## 2023-12-28 DIAGNOSIS — H25813 Combined forms of age-related cataract, bilateral: Secondary | ICD-10-CM

## 2023-12-28 DIAGNOSIS — Z794 Long term (current) use of insulin: Secondary | ICD-10-CM

## 2023-12-28 DIAGNOSIS — I10 Essential (primary) hypertension: Secondary | ICD-10-CM

## 2023-12-28 NOTE — Telephone Encounter (Signed)
 Requested medication (s) are due for refill today - no  Requested medication (s) are on the active medication list -no  Future visit scheduled -yes  Last refill: medication no longer listed on current medication list  Notes to clinic: non delegated Rx  Requested Prescriptions  Pending Prescriptions Disp Refills   cyclobenzaprine  (FLEXERIL ) 5 MG tablet [Pharmacy Med Name: CYCLOBENZAPRINE  5 MG TABLET] 60 tablet 0    Sig: Take 1 tablet (5 mg total) by mouth 3 (three) times daily as needed for muscle spasms. 1 tablet every 8 hours as needed for muscle spasms     Not Delegated - Analgesics:  Muscle Relaxants Failed - 12/28/2023  3:40 PM      Failed - This refill cannot be delegated      Passed - Valid encounter within last 6 months    Recent Outpatient Visits           Yesterday Type 2 diabetes mellitus with complications [facility])   [place] [facility] [place] Mire, MD   2 weeks ago Angina pectoris associated with type 2 diabetes mellitus [facility])   [place] [facility] Glen[place] Mire, MD   1 month ago Chronic kidney disease, stage 3a [facility])   [place] [facility] Glenard Mire, MD   2 months ago Weakness of extremity   [place] [facility] Mecum, Rocky BRAVO, PA-C   3 months ago Weakness of extremity   [place] [facility] Mecum, Rocky BRAVO, PA-C       Future Appointments             In 1 month Sidney, [place], NP [place] [facility] [place]   In 3 months [name], Krichna, MD [facility]               Requested Prescriptions  Pending Prescriptions Disp Refills   cyclobenzaprine  (FLEXERIL ) 5 MG tablet [Pharmacy Med Name: CYCLOBENZAPRINE  5 MG TABLET] 60 tablet 0    Sig: Take 1 tablet (5 mg total) by mouth 3 (three) times daily as needed for muscle spasms. 1 tablet every 8 hours as needed for muscle spasms     Not Delegated - Analgesics:  Muscle  Relaxants Failed - 12/28/2023  3:40 PM      Failed - This refill cannot be delegated      Passed - Valid encounter within last 6 months    Recent Outpatient Visits           Yesterday Type 2 diabetes mellitus with complications [facility])   [place] [facility] Glenard Mire, MD   2 weeks ago Angina pectoris associated with type 2 diabetes mellitus [facility])   [place] [facility] Glenard Mire, MD   1 month ago Chronic kidney disease, stage 3a [facility])   [place] [facility] Glenard Mire, MD   2 months ago Weakness of extremity   [place] [facility] Mecum, Rocky BRAVO, PA-C   3 months ago Weakness of extremity   [place] [facility] Mecum, Rocky BRAVO, PA-C       Future Appointments             In 1 month Hammock, Tylene, NP  [facility] [place]   In 3 months [name], Krichna, MD [facility], [facility]

## 2023-12-28 NOTE — Telephone Encounter (Signed)
 D/c

## 2023-12-30 ENCOUNTER — Other Ambulatory Visit: Payer: Self-pay

## 2023-12-30 MED ORDER — SPIRONOLACTONE 25 MG PO TABS: 25.0000 mg | ORAL_TABLET | Freq: Every day | ORAL | 3 refills | Status: AC

## 2023-12-31 ENCOUNTER — Encounter (INDEPENDENT_AMBULATORY_CARE_PROVIDER_SITE_OTHER): Payer: Medicaid Other | Admitting: Ophthalmology

## 2023-12-31 DIAGNOSIS — Z7984 Long term (current) use of oral hypoglycemic drugs: Secondary | ICD-10-CM

## 2023-12-31 DIAGNOSIS — E113513 Type 2 diabetes mellitus with proliferative diabetic retinopathy with macular edema, bilateral: Secondary | ICD-10-CM

## 2023-12-31 DIAGNOSIS — H35033 Hypertensive retinopathy, bilateral: Secondary | ICD-10-CM

## 2023-12-31 DIAGNOSIS — H4311 Vitreous hemorrhage, right eye: Secondary | ICD-10-CM

## 2023-12-31 DIAGNOSIS — I10 Essential (primary) hypertension: Secondary | ICD-10-CM

## 2023-12-31 DIAGNOSIS — H25813 Combined forms of age-related cataract, bilateral: Secondary | ICD-10-CM

## 2023-12-31 DIAGNOSIS — Z794 Long term (current) use of insulin: Secondary | ICD-10-CM

## 2024-01-02 ENCOUNTER — Ambulatory Visit
Admission: RE | Admit: 2024-01-02 | Discharge: 2024-01-02 | Disposition: A | Discharge status: Home / Self Care | Payer: Medicaid Other | Source: Ambulatory Visit | Attending: Neurology | Admitting: Neurology

## 2024-01-02 DIAGNOSIS — R269 Unspecified abnormalities of gait and mobility: Secondary | ICD-10-CM | POA: Diagnosis not present

## 2024-01-02 DIAGNOSIS — R29898 Other symptoms and signs involving the musculoskeletal system: Secondary | ICD-10-CM

## 2024-01-05 ENCOUNTER — Other Ambulatory Visit: Payer: Self-pay | Admitting: *Deleted

## 2024-01-05 ENCOUNTER — Telehealth: Payer: Self-pay | Admitting: Neurology

## 2024-01-05 NOTE — Patient Instructions (Signed)
Visit Information  [name]  - as a part of your Medicaid benefit, you are eligible for care management and care coordination services at no cost or copay. I was unable to reach you by phone today but would be happy to help you with your health related needs. Please feel free to call me @ [phone].   A member of the Managed Medicaid care management team will reach out to you again over the next 7 days.   Estanislado Emms RN, BSN [place]  [facility] RN Care Manager [phone]

## 2024-01-05 NOTE — Telephone Encounter (Signed)
Pt reschedule appointment due to work schedule conflict

## 2024-01-05 NOTE — Patient Outreach (Signed)
  Medicaid Managed Care   Unsuccessful Attempt Note   01/05/2024 Name: Sarah Duffy MRN: [id number] DOB: [birth year]  Referred by: [name], MD Reason for referral : High Risk Managed Medicaid (Unsucessful RNCM follow up outreach)   An unsuccessful telephone outreach was attempted today. The patient was referred to the case management team for assistance with care management and care coordination.    Follow Up Plan: A HIPAA compliant phone message was left for the patient providing contact information and requesting a return call. and The Managed Medicaid care management team will reach out to the patient again over the next 7 days.    Estanislado Emms RN, BSN [place]  [facility] RN Care Manager [phone]

## 2024-01-06 ENCOUNTER — Ambulatory Visit: Payer: Medicaid Other | Admitting: Neurology

## 2024-01-07 ENCOUNTER — Telehealth: Payer: Self-pay

## 2024-01-07 ENCOUNTER — Other Ambulatory Visit (HOSPITAL_COMMUNITY): Payer: Self-pay

## 2024-01-07 DIAGNOSIS — I2510 Atherosclerotic heart disease of native coronary artery without angina pectoris: Secondary | ICD-10-CM | POA: Diagnosis not present

## 2024-01-07 DIAGNOSIS — E113513 Type 2 diabetes mellitus with proliferative diabetic retinopathy with macular edema, bilateral: Secondary | ICD-10-CM | POA: Diagnosis not present

## 2024-01-07 DIAGNOSIS — Z794 Long term (current) use of insulin: Secondary | ICD-10-CM | POA: Diagnosis not present

## 2024-01-07 DIAGNOSIS — E119 Type 2 diabetes mellitus without complications: Secondary | ICD-10-CM | POA: Diagnosis not present

## 2024-01-07 LAB — HEMOGLOBIN A1C: Hemoglobin A1C: 10.6

## 2024-01-07 NOTE — Telephone Encounter (Signed)
Pharmacy Patient Advocate Encounter   Received notification from CoverMyMeds that prior authorization for Dexcom G7 sensor is required/requested.   Insurance verification completed.   The patient is insured through [employer] .   Per test claim: PA required; PA submitted to above mentioned insurance via CoverMyMeds Key/confirmation #/EOC [id number] Status is pending

## 2024-01-10 NOTE — Telephone Encounter (Signed)
Please send updated order for spit sleep study

## 2024-01-10 NOTE — Telephone Encounter (Unsigned)
Copied from CRM [id number]. Topic: General - Other >> Jan 10, 2024  1:49 PM [place] B wrote: Reason for CRM: [facility] from [facility], says received order for sleep study, but didn't specify what kind. Please cb

## 2024-01-11 ENCOUNTER — Ambulatory Visit: Payer: Medicaid Other | Admitting: Internal Medicine

## 2024-01-11 NOTE — Telephone Encounter (Signed)
Referral form faxed

## 2024-01-11 NOTE — Progress Notes (Deleted)
Name: Sarah Duffy  MRN/ DOB: [id number], 1971/03/06   Age/ Sex: 53 y.o., female    PCP: [name], MD   Reason for Endocrinology Evaluation: Type 2 Diabetes Mellitus     Date of Initial Endocrinology Visit: 08/31/2022    PATIENT IDENTIFIER: Sarah Duffy is a 53 y.o. female with a past medical history of t2DM, Dyslipidemia, CAD, CHF. The patient presented for initial endocrinology clinic visit on 08/31/2022 for consultative assistance with her diabetes management.    HPI: Sarah Duffy was    Diagnosed with DM years ago  Prior Medications tried/Intolerance: Metformin, glipizide, switch to insulin in 2017 after a huge left foot infection . Was on prandial insulin at some point         Hemoglobin A1c has ranged from 7.9% in 2023, peaking at 13.0% in 2020.  Has hx of left foot debridement  in 2017, had another infection and injury in 2021 while walking on a seashell   On her initial visit to our clinic she had an A1c of 7.9%, she was on Basaglar, Victoza, and Jardiance.  We switched Basaglar to [employer], switched Victoza to Trulicity and continued Jardiance     SUBJECTIVE:   During the last visit (06/21/2023): A1c 8.4%   Today (01/11/24): Sarah Duffy is here for follow-up on diabetes management.  She  checks her blood sugars multiple times daily. The patient has  had hypoglycemic episodes since the last clinic visit, which typically occur at night .  Patient continues to follow-up with Dr. 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  She was also put on Basaglar 24 units, of note during her last visit here she was on Basaglar 70 units Denies nausea, vomiting  Denies constipation  diarrhea     HOME DIABETES REGIMEN: Jardiance 25 mg daily  Ozempic 0.5 mg once weekly Basaglar 70  units daily NovoLog Per correction factor : BG-120/30)    Statin: yes ACE-I/ARB: yes   CONTINUOUS GLUCOSE MONITORING RECORD INTERPRETATION    Dates of Recording: 6/25-06/21/2023  Sensor description:dexcom  Results statistics:   CGM use % of time 93  Average and SD 197/65  Time in range  40  %  % Time Above 180 38  % Time above 250 20  % Time Below target 1   Glycemic patterns summary: BG's trend down overnight, and increase during the day  Hyperglycemic episodes postprandial  Hypoglycemic episodes occurred at night  Overnight periods: BG's trend down   DIABETIC COMPLICATIONS: Microvascular complications:  Proliferative DR with edema , neuropathy (s/p eye injections 02/2023) Denies: CKD Last eye exam: Completed 10/19/2023  Macrovascular complications:  CAD Denies: PVD, CVA   PAST HISTORY: Past Medical History:  Past Medical History:  Diagnosis Date   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 02/2021 Echo: EF 35-40%, glob HK, GrI DD, nl RV fxn, mildly dil LA, mod MR; b. 06/2022 Echo: EF 50-55%, no rwma, GrII DD, nl RV fxn, RVSP , mildly dil LA, mild MR, AoV sclerosis.   CKD (chronic kidney disease), stage III (HCC)    Complication of anesthesia 03/23/2023   Possible gastroparesis based off food in stomach during EGD   Coronary artery disease    a. 04/2021 Cath: LM nl, LAD 70p/m, 58m, 80d, LCX  24m, OM1 40, OM2 99 (fills via collats from OM1), RCA 60d, RPDA 75.   COVID-19 2021   Hyperlipidemia LDL goal <70    Hypertension    Ischemic cardiomyopathy    a. 02/2021 Echo: EF 35-40%, glob HK, GrI DD; b. 06/2022 Echo: EF 50-55%.   Moderate mitral regurgitation    a. 02/2021 Echo: Mod MR.   MRSA infection within last 3 months 02/25/2016   Osteomyelitis of foot (HCC) 08/26/2016   Type II diabetes mellitus (HCC)    Past Surgical History:  Past Surgical History:  Procedure Laterality Date   CHOLECYSTECTOMY  1999   COLONOSCOPY WITH  PROPOFOL N/A 03/25/2021   Procedure: COLONOSCOPY WITH PROPOFOL;  Surgeon: [name], MD;  Location: [facility] ENDOSCOPY;  Service: Gastroenterology;  Laterality: N/A;   COLONOSCOPY WITH PROPOFOL N/A 04/17/2021   Procedure: COLONOSCOPY WITH PROPOFOL;  Surgeon: [name], MD;  Location: [facility] ENDOSCOPY;  Service: Gastroenterology;  Laterality: N/A;   ESOPHAGOGASTRODUODENOSCOPY (EGD) WITH PROPOFOL N/A 03/25/2021   Procedure: ESOPHAGOGASTRODUODENOSCOPY (EGD) WITH PROPOFOL;  Surgeon: [name], MD;  Location: [facility] ENDOSCOPY;  Service: Gastroenterology;  Laterality: N/A;   ESOPHAGOGASTRODUODENOSCOPY (EGD) WITH PROPOFOL N/A 04/17/2021   Procedure: ESOPHAGOGASTRODUODENOSCOPY (EGD) WITH PROPOFOL;  Surgeon: [name], MD;  Location: [facility] ENDOSCOPY;  Service: Gastroenterology;  Laterality: N/A;   ESOPHAGOGASTRODUODENOSCOPY (EGD) WITH PROPOFOL N/A 03/23/2023   Procedure: ESOPHAGOGASTRODUODENOSCOPY (EGD) WITH PROPOFOL;  Surgeon: [name], MD;  Location: [facility] ENDOSCOPY;  Service: Gastroenterology;  Laterality: N/A;   RIGHT/LEFT HEART CATH AND CORONARY ANGIOGRAPHY Bilateral 04/14/2021   Procedure: RIGHT/LEFT HEART CATH AND CORONARY ANGIOGRAPHY;  Surgeon: Yvonne Kendall, MD;  Location: [facility] INVASIVE CV LAB;  Service: Cardiovascular;  Laterality: Bilateral;   TOE SURGERY Left 02/07/2016   Pinky Toe    Social History:  reports that she has never smoked. She has never used smokeless tobacco. She reports that she does not drink alcohol and does not use drugs. Family History:  Family History  Problem Relation Age of Onset   Diabetes Mother    Ulcers Mother    Heart disease Father    AAA (abdominal aortic aneurysm) Father    Diabetes Father    Hypertension Father    Stroke Father    Alzheimer's disease Father    Heart attack Sister    Seizures Brother    Diabetes Maternal Grandmother    Breast cancer Maternal Grandmother      HOME MEDICATIONS: Allergies as of 01/11/2024   No Known Allergies       Medication List        Accurate as of January 11, 2024  7:15 AM. If you have any questions, ask your nurse or doctor.          Accu-Chek Guide test strip Generic drug: glucose blood Check blood sugar 2 times daily as needed   Accu-Chek Guide w/Device Kit Use to check blood sugar 2 time daily as needed   accu-chek multiclix lancets Check sugar 2 times daily as needed.   amLODipine 5 MG tablet Commonly known as: NORVASC Take 1 tablet (5 mg total) by mouth daily.   atorvastatin 80 MG tablet Commonly known as: LIPITOR Take 1 tablet (80 mg total) by mouth daily.   Basaglar KwikPen 100 UNIT/ML Inject 70 Units into the skin daily.   carvedilol 12.5 MG tablet Commonly known as: COREG TAKE 1 TABLET BY MOUTH 2 TIMES DAILY.   Dexcom G7 Sensor Misc 1 DEVICE BY DOES NOT APPLY ROUTE AS DIRECTED.   Entresto 24-26 MG  Generic drug: sacubitril-valsartan Take 1 tablet by mouth 2 (two) times daily.   ezetimibe 10 MG tablet Commonly known as: ZETIA Take 1 tablet (10 mg total) by mouth daily.   Insulin Pen Needle 32G X 4 MM Misc 1 Device by Does not apply route in the morning, at noon, in the evening, and at bedtime.   ipratropium 0.03 % nasal spray Commonly known as: ATROVENT Place 2 sprays into both nostrils 2 (two) times daily.   Jardiance 25 MG Tabs tablet Generic drug: empagliflozin Take 1 tablet (25 mg total) by mouth daily.   levocetirizine 5 MG tablet Commonly known as: XYZAL Take 1 tablet (5 mg total) by mouth daily at 2 PM.   metaxalone 800 MG tablet Commonly known as: SKELAXIN TAKE 1 TABLET (800 MG TOTAL) BY MOUTH 3 (THREE) TIMES DAILY AS NEEDED FOR MUSCLE SPASMS.   naproxen 500 MG tablet Commonly known as: NAPROSYN   nitroGLYCERIN 0.4 MG SL tablet Commonly known as: NITROSTAT PLACE 1 TABLET UNDER THE TONGUE EVERY 5 MINUTES AS NEEDED FOR CHEST PAIN.   NovoLOG FlexPen 100 UNIT/ML FlexPen Generic drug: insulin aspart Max daily 30 units   Nurtec 75 MG  Tbdp Generic drug: Rimegepant Sulfate Take 1 tablet (75 mg total) by mouth daily as needed. Max of every other day prn   Ozempic (0.25 or 0.5 MG/DOSE) 2 MG/3ML Sopn Generic drug: Semaglutide(0.25 or 0.5MG /DOS)   prednisoLONE acetate 1 % ophthalmic suspension Commonly known as: PRED FORTE Place 1 drop into the left eye 4 (four) times daily.   pregabalin 75 MG capsule Commonly known as: LYRICA Take 75 mg by mouth 3 (three) times daily.   spironolactone 25 MG tablet Commonly known as: ALDACTONE Take 1 tablet (25 mg total) by mouth daily.   triamcinolone cream 0.1 % Commonly known as: KENALOG Apply 1 Application topically 2 (two) times daily.   Ventolin HFA 108 (90 Base) MCG/ACT inhaler Generic drug: albuterol INHALE 2 PUFFS BY MOUTH EVERY 4 HOURS AS NEEDED FOR WHEEZE OR FOR SHORTNESS OF BREATH         ALLERGIES: No Known Allergies   REVIEW OF SYSTEMS: A comprehensive ROS was conducted with the patient and is negative except as per HPI    OBJECTIVE:   VITAL SIGNS: LMP  (LMP Unknown)    PHYSICAL EXAM:  General: Sarah Duffy appears well and is in NAD  Neck: General: Supple without adenopathy or carotid bruits. Thyroid: Thyroid size normal.  No goiter or nodules appreciated.   Lungs: Clear with good BS bilat with no rales, rhonchi, or wheezes  Heart: RRR   Abdomen:  soft, nontender  Extremities:  Lower extremities - No pretibial edema.   Neuro: MS is good with appropriate affect, Sarah Duffy is alert and Ox3    DM foot exam: 06/21/2023  The skin of the feet is intact without sores or ulcerations. The pedal pulses are 2+ on right and 2+ on left. The sensation is absent to a screening 5.07, 10 gram monofilament bilaterally   DATA REVIEWED:  Lab Results  Component Value Date   HGBA1C 12.4 (A) 12/10/2023   HGBA1C 8.4 (A) 06/21/2023   HGBA1C 8.6 (A) 03/04/2023    Latest Reference Range & Units 12/11/23 14:16  Sodium 135 - 145 mmol/L 131 (L)  Potassium 3.5 - 5.1 mmol/L 5.5 (H)   Chloride 98 - 111 mmol/L 101  CO2 22 - 32 mmol/L 19 (L)  Glucose 70 - 99 mg/dL 161 (H)  BUN 6 - 20 mg/dL  33 (H)  Creatinine 0.44 - 1.00 mg/dL 1.32 (H)  Calcium 8.9 - 10.3 mg/dL 9.3  Anion gap 5 - 15  11  GFR, Estimated >60 mL/min 51 (L)  (L): Data is abnormally low (H): Data is abnormally high  ASSESSMENT / PLAN / RECOMMENDATIONS:   1) Type 2 Diabetes Mellitus, Poorly  controlled, With retinopathic, neuropathic, microalbuminuria  and macrovascular   complications - Most recent A1c of 12.4 %. Goal A1c < 7.0 %.    Patient continues with hyperglycemia  She has been noted with hypoglycemia overnight, will decrease Basaglar as below  She is tolerating Ozempic, will increase dose as below  She has been inconsistent with taking NovoLog per correction scale , she was encouraged again to use correction scale before meals , for example, last night she had a BG reading of 350 mg/DL, patient stated she" only" had Pepsi and corn chips, we discussed that both of these items are high in starch, hence BG reading 350 Mg/DL, referral to our CDE has been placed for education regarding carbohydrate  MEDICATIONS: Decrease Basaglar 70 units daily Increase Ozempic 0.5 mg weekly Continue Jardiance 25 mg daily Continue  NovoLog Per correction factor : BG-120/30) TIDQAC  EDUCATION / INSTRUCTIONS: BG monitoring instructions: Patient is instructed to check her blood sugars 3 times a day, before each meal Call [facility] [facility] if: BG persistently < 70  I reviewed the Rule of 15 for the treatment of hypoglycemia in detail with the patient. Literature supplied.   2) Diabetic complications:  Eye: Does  have known diabetic retinopathy.  Neuro/ Feet: Does  have known diabetic peripheral neuropathy. Renal: Patient does not have known baseline CKD. She is  on an ACEI/ARB at present.       F/U in 4 months    Signed electronically by: Lyndle Herrlich, MD  [facility] Endocrinology  [facility] [address] [place] [place], [place] [address] Phone: [phone] FAX: [phone]   CC: [name], MD [address] [place] [address] Phone: [phone]  Fax: [phone]    Return to Endocrinology clinic as below: Future Appointments  Date Time Provider Department Center  01/11/2024 10:50 AM Delos Klich, [name], MD [facility] None  01/18/2024  3:45 PM Candelaria Stagers, DPM [facility]  02/02/2024  1:55 PM Charlsie Quest, NP [facility] None  04/10/2024 10:40 AM [name], MD [facility]  04/17/2024  9:30 AM Levert Feinstein, MD [facility] None

## 2024-01-18 ENCOUNTER — Ambulatory Visit: Payer: Medicaid Other | Admitting: Podiatry

## 2024-01-18 ENCOUNTER — Encounter: Payer: Self-pay | Admitting: Podiatry

## 2024-01-18 VITALS — Ht 69.0 in | Wt 184.0 lb

## 2024-01-18 DIAGNOSIS — M216X2 Other acquired deformities of left foot: Secondary | ICD-10-CM | POA: Diagnosis not present

## 2024-01-18 DIAGNOSIS — M25372 Other instability, left ankle: Secondary | ICD-10-CM

## 2024-01-18 NOTE — Progress Notes (Signed)
 Subjective:  Patient ID: Sarah Duffy, female    DOB: Nov 07, 1971,  MRN: [id number]  Chief Complaint  Patient presents with   Foot Pain    Pt is here due to left foot pain, pt states it feels like she is walking on something other than her foot like a ligament or tendon, and is causing pain to foot and ankle.    53 y.o. female presents with the above complaint.  Patient presents with complaint of left ankle pain and ankle instability while ambulating.  She states that she feels like walking on something.  She does states that she may have had a torn alcohol ligament denies any injury denies any other acute complaints.  Pain scale 5 out of 10 dull aching nature.   Review of Systems: Negative except as noted in the HPI. Denies N/V/F/Ch.  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 02/2021 Echo: EF 35-40%, glob HK, GrI DD, nl RV fxn, mildly dil LA, mod MR; b. 06/2022 Echo: EF 50-55%, no rwma, GrII DD, nl RV fxn, RVSP , mildly dil LA, mild MR, AoV sclerosis.   CKD (chronic kidney disease), stage III (HCC)    Complication of anesthesia 03/23/2023   Possible gastroparesis based off food in stomach during EGD   Coronary artery disease    a. 04/2021 Cath: LM nl, LAD 70p/m, 66m, 80d, LCX 38m, OM1 40, OM2 99 (fills via collats from OM1), RCA 60d, RPDA 75.   COVID-19 2021   Hyperlipidemia LDL goal <70    Hypertension    Ischemic cardiomyopathy    a. 02/2021 Echo: EF 35-40%, glob HK, GrI DD; b. 06/2022 Echo: EF 50-55%.   Moderate mitral regurgitation    a. 02/2021 Echo: Mod MR.   MRSA infection within last 3 months 02/25/2016   Osteomyelitis of foot (HCC) 08/26/2016   Type II diabetes mellitus (HCC)     Current Outpatient Medications:    amLODipine  (NORVASC ) 5 MG tablet, Take 1 tablet (5 mg total) by mouth daily., Disp: 90 tablet, Rfl: 1   atorvastatin  (LIPITOR) 80 MG tablet, Take 1 tablet (80 mg total) by mouth  daily., Disp: 90 tablet, Rfl: 1   Blood Glucose Monitoring Suppl (ACCU-CHEK GUIDE) w/Device KIT, Use to check blood sugar 2 time daily as needed, Disp: 1 kit, Rfl: 0   carvedilol  (COREG ) 12.5 MG tablet, TAKE 1 TABLET BY MOUTH 2 TIMES DAILY., Disp: 180 tablet, Rfl: 0   Continuous Glucose Sensor (DEXCOM G7 SENSOR) MISC, 1 DEVICE BY DOES NOT APPLY ROUTE AS DIRECTED., Disp: 3 each, Rfl: 11   ENTRESTO  24-26 MG, Take 1 tablet by mouth 2 (two) times daily., Disp: 60 tablet, Rfl: 3   ezetimibe  (ZETIA ) 10 MG tablet, Take 1 tablet (10 mg total) by mouth daily., Disp: 90 tablet, Rfl: 1   glucose blood (ACCU-CHEK GUIDE) test strip, Check blood sugar 2 times daily as needed, Disp: 100 each, Rfl: 12   insulin  aspart (NOVOLOG  FLEXPEN) 100 UNIT/ML FlexPen, Max daily 30 units, Disp: 30 mL, Rfl: 3   Insulin  Glargine (BASAGLAR  KWIKPEN) 100 UNIT/ML, Inject 70 Units into the skin daily., Disp: 60 mL, Rfl: 3   Insulin  Pen Needle 32G X 4 MM MISC, 1 Device by Does not apply route in the morning, at noon, in the evening, and at bedtime., Disp: 400 each, Rfl: 3   ipratropium (ATROVENT ) 0.03 % nasal spray, Place 2 sprays into both nostrils 2 (  two) times daily., Disp: 30 mL, Rfl: 2   JARDIANCE  25 MG TABS tablet, Take 1 tablet (25 mg total) by mouth daily., Disp: 90 tablet, Rfl: 2   Lancets (ACCU-CHEK MULTICLIX) lancets, Check sugar 2 times daily as needed., Disp: 100 each, Rfl: 12   levocetirizine (XYZAL ) 5 MG tablet, Take 1 tablet (5 mg total) by mouth daily at 2 PM., Disp: 90 tablet, Rfl: 1   metaxalone  (SKELAXIN ) 800 MG tablet, TAKE 1 TABLET (800 MG TOTAL) BY MOUTH 3 (THREE) TIMES DAILY AS NEEDED FOR MUSCLE SPASMS., Disp: 90 tablet, Rfl: 0   naproxen  (NAPROSYN ) 500 MG tablet, , Disp: , Rfl:    OZEMPIC , 0.25 OR 0.5 MG/DOSE, 2 MG/3ML SOPN, , Disp: , Rfl:    prednisoLONE  acetate (PRED FORTE ) 1 % ophthalmic suspension, Place 1 drop into the left eye 4 (four) times daily., Disp: 10 mL, Rfl: 0   pregabalin  (LYRICA ) 75 MG  capsule, Take 75 mg by mouth 3 (three) times daily., Disp: , Rfl:    Rimegepant Sulfate  (NURTEC) 75 MG TBDP, Take 1 tablet (75 mg total) by mouth daily as needed. Max of every other day prn, Disp: 16 tablet, Rfl: 0   spironolactone  (ALDACTONE ) 25 MG tablet, Take 1 tablet (25 mg total) by mouth daily., Disp: 90 tablet, Rfl: 3   triamcinolone  cream (KENALOG ) 0.1 %, Apply 1 Application topically 2 (two) times daily., Disp: 453.6 g, Rfl: 0   VENTOLIN  HFA 108 (90 Base) MCG/ACT inhaler, INHALE 2 PUFFS BY MOUTH EVERY 4 HOURS AS NEEDED FOR WHEEZE OR FOR SHORTNESS OF BREATH, Disp: 18 each, Rfl: 1   nitroGLYCERIN  (NITROSTAT ) 0.4 MG SL tablet, PLACE 1 TABLET UNDER THE TONGUE EVERY 5 MINUTES AS NEEDED FOR CHEST PAIN., Disp: 25 tablet, Rfl: 0  Social History   Tobacco Use  Smoking Status Never  Smokeless Tobacco Never    No Known Allergies Objective:  There were no vitals filed for this visit. Body mass index is 27.17 kg/m. Constitutional Well developed. Well nourished.  Vascular Dorsalis pedis pulses palpable bilaterally. Posterior tibial pulses palpable bilaterally. Capillary refill normal to all digits.  No cyanosis or clubbing noted. Pedal hair growth normal.  Neurologic Normal speech. Oriented to person, place, and time. Epicritic sensation to light touch grossly present bilaterally.  Dermatologic Nails well groomed and normal in appearance. No open wounds. No skin lesions.  Orthopedic: Left ankle instability noted.  Pain on palpation to the ATFL ligament no pain at the CFL ligament no pain with dorsiflexion eversion of the foot some pain with plantarflexion inversion of the foot.  No pain at the posterior tibial tendon peroneal tendon Achilles tendon   Radiographs: None Assessment:   1. Left ankle instability    Plan:  Patient was evaluated and treated and all questions answered.  Left ankle instability -All questions and concerns were discussed with the patient in extensive  detail given the amount of pain that she is having she will benefit from Tri-Lock ankle brace to give her some stability while ambulating.  I discussed with her in the future she may need Arthrex internal bracing/ligament repair if it continues to bother her if she states understanding for now she will continue with the bracing and see if it helps. -Sugar modification discussed   No follow-ups on file.

## 2024-01-19 DIAGNOSIS — G8929 Other chronic pain: Secondary | ICD-10-CM | POA: Diagnosis not present

## 2024-01-19 DIAGNOSIS — R2689 Other abnormalities of gait and mobility: Secondary | ICD-10-CM | POA: Diagnosis not present

## 2024-01-19 DIAGNOSIS — G479 Sleep disorder, unspecified: Secondary | ICD-10-CM | POA: Diagnosis not present

## 2024-01-19 DIAGNOSIS — G44229 Chronic tension-type headache, not intractable: Secondary | ICD-10-CM | POA: Diagnosis not present

## 2024-01-19 DIAGNOSIS — R531 Weakness: Secondary | ICD-10-CM | POA: Diagnosis not present

## 2024-01-19 DIAGNOSIS — M545 Low back pain, unspecified: Secondary | ICD-10-CM | POA: Diagnosis not present

## 2024-01-21 ENCOUNTER — Telehealth: Payer: Self-pay | Admitting: *Deleted

## 2024-01-21 ENCOUNTER — Telehealth: Payer: Self-pay | Admitting: Family Medicine

## 2024-01-21 DIAGNOSIS — R400 Somnolence: Secondary | ICD-10-CM

## 2024-01-21 DIAGNOSIS — R0683 Snoring: Secondary | ICD-10-CM

## 2024-01-21 NOTE — Patient Outreach (Signed)
  Medicaid Managed Care   Unsuccessful Outreach Note  01/21/2024 Name: Sarah Duffy MRN: [id number] DOB: 08-03-1971  Referred by: [name], Krichna, MD Reason for referral : High Risk Managed Medicaid (Unsuccessful RNCM telephone outreach)   A second unsuccessful telephone outreach was attempted today. The patient was referred to the case management team for assistance with care management and care coordination.   Follow Up Plan: A HIPAA compliant phone message was left for the patient providing contact information and requesting a return call.  The care management team will reach out to the patient again over the next 7 days.   Andrea Dimes RN, BSN [place]  [facility] RN Care Manager [phone]

## 2024-01-21 NOTE — Telephone Encounter (Signed)
 Copied from CRM [id number]. Topic: General - Other >> Jan 21, 2024  9:39 AM Lorrane Rosette wrote: Reason for CRM: Amy with [facility] stated that they need an updated order because the order does not specify type of sleep study. Cb# [phone]

## 2024-01-21 NOTE — Telephone Encounter (Signed)
 New order placed with in Lab type

## 2024-01-21 NOTE — Patient Instructions (Signed)
 Visit Information  [name]  - as a part of your Medicaid benefit, you are eligible for care management and care coordination services at no cost or copay. I was unable to reach you by phone today but would be happy to help you with your health related needs. Please feel free to call me @ [phone].   A member of the Managed Medicaid care management team will reach out to you again over the next 7 days.   Estanislado Emms RN, BSN [place]  [facility] Institute [facility] RN Care Manager [phone]

## 2024-01-24 ENCOUNTER — Other Ambulatory Visit: Payer: Self-pay

## 2024-01-24 ENCOUNTER — Other Ambulatory Visit: Payer: Self-pay | Admitting: *Deleted

## 2024-01-24 MED ORDER — NITROGLYCERIN 0.4 MG SL SUBL: SUBLINGUAL_TABLET | SUBLINGUAL | 0 refills | Status: AC

## 2024-01-24 NOTE — Telephone Encounter (Signed)
 Requested Prescriptions   Signed Prescriptions Disp Refills   nitroGLYCERIN  (NITROSTAT ) 0.4 MG SL tablet 25 tablet 0    Sig: PLACE 1 TABLET UNDER THE TONGUE EVERY 5 MINUTES AS NEEDED FOR CHEST PAIN.    Authorizing Provider: Constancia Delton    Ordering User: Duayne Gey   Last office visit:  11/01/23 with plan to f/u in 3 months Next office visit: 02/02/24

## 2024-01-24 NOTE — Patient Outreach (Signed)
 Care Management/Care Coordination  RN Case Manager Case Closure Note  01/24/2024 Name: Sarah Duffy MRN: [id number] DOB: 09/07/1971  Sarah Duffy is a 53 y.o. year old female who is a primary care patient of [name], Krichna, MD. The care management/care coordination team was consulted for assistance with chronic disease management and/or care coordination needs.   Care Plan : RN Care Manager Plan of Care  Updates made by Aura Leeds, RN since 01/24/2024 12:00 AM  Completed 01/24/2024   Problem: Health Management needs related to DMII Resolved 01/24/2024     Long-Range Goal: Development of Plan of Care to address Health Management needs related to DMII Completed 01/24/2024  Start Date: 04/13/2023  Expected End Date: 01/14/2024  Note:   Current Barriers:  Chronic Disease Management support and education needs related to DMII Unable to maintain contact with patient   RNCM Clinical Goal(s):  Patient will verbalize understanding of plan for management of DMII as evidenced by patient reports take all medications exactly as prescribed and will call provider for medication related questions as evidenced by patient reports    attend all scheduled medical appointments: 12/06/23 with [facility], 12/09/23 for Mammography, 12/10/23 with PCP, 12/14/23 with Retinal Specialist, 12/17/23 with Diabetic Education  Neurology 01/05/23, 01/19/24 with Dr. Walden Guise, and 02/02/24 Cardiology as evidenced by provider documentation in EMR        continue to work with RN Care Manager and/or Social Worker to address care management and care coordination needs related to DMII as evidenced by adherence to CM Team Scheduled appointments     through collaboration with Medical illustrator, provider, and care team.   Interventions: Inter-disciplinary care team collaboration (see longitudinal plan of care) Evaluation of current treatment plan related to  self management and patient's adherence to plan as established by  provider Provided therapeutic listening Advised patient to look in to orthotic shoe inserts to help with back pain   Pain Interventions:  (Status:  New goal.) Long Term Goal Pain assessment performed Medications reviewed Reviewed provider established plan for pain management Discussed importance of adherence to all scheduled medical appointments Counseled on the importance of reporting any/all new or changed pain symptoms or management strategies to pain management provider Advised patient to report to care team affect of pain on daily activities Discussed use of relaxation techniques and/or diversional activities to assist with pain reduction (distraction, imagery, relaxation, massage, acupressure, TENS, heat, and cold application Reviewed with patient prescribed pharmacological and nonpharmacological pain relief strategies Assessed social determinant of health barriers   Diabetes:  (Status: Goal on Track (progressing): YES.) Long Term Goal   Lab Results  Component Value Date   HGBA1C 8.4 (A) 06/21/2023   @ Assessed patient's understanding of A1c goal: <7% Provided education to patient about basic DM disease process; Reviewed prescribed diet with patient low carb/MyPlate method, provided examples of food choices; Discussed plans with patient for ongoing care management follow up and provided patient with direct contact information for care management team;      Reviewed scheduled/upcoming provider appointments including: see above list;         Assessed social determinant of health barriers;        Discussed healthy food choices and provided with education Advised patient to reschedule missed visit with Endocrinology   Patient Goals/Self-Care Activities: Take medications as prescribed   Attend all scheduled provider appointments Call provider office for new concerns or questions  drink 6 to 8 glasses of water each day fill  half of plate with vegetables manage portion  size       Plan: We have been unable to make contact with the patient for follow up. The care management team is available to follow up with the patient should new care management/care coordination needs arise.   Arna Better RN, BSN [place]  [facility] RN Care Manager [phone]

## 2024-01-29 ENCOUNTER — Other Ambulatory Visit: Payer: Self-pay | Admitting: Family Medicine

## 2024-01-29 DIAGNOSIS — J3089 Other allergic rhinitis: Secondary | ICD-10-CM

## 2024-02-02 ENCOUNTER — Ambulatory Visit: Payer: Medicaid Other | Admitting: Cardiology

## 2024-02-07 ENCOUNTER — Other Ambulatory Visit: Payer: Self-pay | Admitting: Family Medicine

## 2024-02-07 DIAGNOSIS — M5442 Lumbago with sciatica, left side: Secondary | ICD-10-CM

## 2024-02-11 ENCOUNTER — Ambulatory Visit: Payer: Medicaid Other | Admitting: Nurse Practitioner

## 2024-02-15 ENCOUNTER — Ambulatory Visit: Payer: Medicaid Other | Admitting: Podiatry

## 2024-02-15 DIAGNOSIS — M5416 Radiculopathy, lumbar region: Secondary | ICD-10-CM | POA: Diagnosis not present

## 2024-02-15 NOTE — Telephone Encounter (Signed)
 Pharmacy Patient Advocate Encounter  Received notification from [facility] that Prior Authorization for Dexcom G7 sensor has been APPROVED through 01/06/25   PA #/Case ID/Reference #: [id number]

## 2024-02-17 ENCOUNTER — Other Ambulatory Visit (HOSPITAL_COMMUNITY): Payer: Self-pay

## 2024-02-17 DIAGNOSIS — M25375 Other instability, left foot: Secondary | ICD-10-CM | POA: Diagnosis not present

## 2024-02-17 DIAGNOSIS — M79672 Pain in left foot: Secondary | ICD-10-CM | POA: Diagnosis not present

## 2024-02-18 DIAGNOSIS — M5416 Radiculopathy, lumbar region: Secondary | ICD-10-CM | POA: Diagnosis not present

## 2024-02-21 ENCOUNTER — Ambulatory Visit: Payer: Medicaid Other | Admitting: Internal Medicine

## 2024-02-22 DIAGNOSIS — G8929 Other chronic pain: Secondary | ICD-10-CM | POA: Diagnosis not present

## 2024-02-22 DIAGNOSIS — R531 Weakness: Secondary | ICD-10-CM | POA: Diagnosis not present

## 2024-02-22 DIAGNOSIS — R479 Unspecified speech disturbances: Secondary | ICD-10-CM | POA: Diagnosis not present

## 2024-02-22 DIAGNOSIS — G479 Sleep disorder, unspecified: Secondary | ICD-10-CM | POA: Diagnosis not present

## 2024-02-22 DIAGNOSIS — E559 Vitamin D deficiency, unspecified: Secondary | ICD-10-CM | POA: Diagnosis not present

## 2024-02-22 DIAGNOSIS — G44229 Chronic tension-type headache, not intractable: Secondary | ICD-10-CM | POA: Diagnosis not present

## 2024-02-22 DIAGNOSIS — R2689 Other abnormalities of gait and mobility: Secondary | ICD-10-CM | POA: Diagnosis not present

## 2024-02-22 DIAGNOSIS — R42 Dizziness and giddiness: Secondary | ICD-10-CM | POA: Diagnosis not present

## 2024-02-22 DIAGNOSIS — M545 Low back pain, unspecified: Secondary | ICD-10-CM | POA: Diagnosis not present

## 2024-02-22 DIAGNOSIS — E538 Deficiency of other specified B group vitamins: Secondary | ICD-10-CM | POA: Diagnosis not present

## 2024-02-24 ENCOUNTER — Ambulatory Visit: Attending: Otolaryngology

## 2024-02-24 DIAGNOSIS — R942 Abnormal results of pulmonary function studies: Secondary | ICD-10-CM | POA: Diagnosis not present

## 2024-02-24 DIAGNOSIS — R0683 Snoring: Secondary | ICD-10-CM | POA: Diagnosis not present

## 2024-02-24 DIAGNOSIS — G4733 Obstructive sleep apnea (adult) (pediatric): Secondary | ICD-10-CM | POA: Insufficient documentation

## 2024-02-24 DIAGNOSIS — Z794 Long term (current) use of insulin: Secondary | ICD-10-CM | POA: Diagnosis not present

## 2024-02-24 DIAGNOSIS — I2510 Atherosclerotic heart disease of native coronary artery without angina pectoris: Secondary | ICD-10-CM | POA: Diagnosis not present

## 2024-02-24 DIAGNOSIS — E119 Type 2 diabetes mellitus without complications: Secondary | ICD-10-CM | POA: Diagnosis not present

## 2024-02-26 DIAGNOSIS — E119 Type 2 diabetes mellitus without complications: Secondary | ICD-10-CM | POA: Diagnosis not present

## 2024-02-26 DIAGNOSIS — I2510 Atherosclerotic heart disease of native coronary artery without angina pectoris: Secondary | ICD-10-CM | POA: Diagnosis not present

## 2024-02-26 DIAGNOSIS — Z794 Long term (current) use of insulin: Secondary | ICD-10-CM | POA: Diagnosis not present

## 2024-03-01 ENCOUNTER — Ambulatory Visit: Payer: Medicaid Other | Admitting: Cardiology

## 2024-03-01 DIAGNOSIS — I2510 Atherosclerotic heart disease of native coronary artery without angina pectoris: Secondary | ICD-10-CM | POA: Diagnosis not present

## 2024-03-02 DIAGNOSIS — M47816 Spondylosis without myelopathy or radiculopathy, lumbar region: Secondary | ICD-10-CM | POA: Diagnosis not present

## 2024-03-06 ENCOUNTER — Encounter: Payer: Self-pay | Admitting: Family Medicine

## 2024-03-07 ENCOUNTER — Ambulatory Visit: Admitting: Family Medicine

## 2024-03-07 ENCOUNTER — Other Ambulatory Visit: Payer: Self-pay | Admitting: Family Medicine

## 2024-03-07 DIAGNOSIS — E1159 Type 2 diabetes mellitus with other circulatory complications: Secondary | ICD-10-CM

## 2024-03-08 ENCOUNTER — Ambulatory Visit: Attending: Neurology

## 2024-03-08 DIAGNOSIS — R471 Dysarthria and anarthria: Secondary | ICD-10-CM | POA: Diagnosis present

## 2024-03-08 NOTE — Therapy (Signed)
 OUTPATIENT SPEECH LANGUAGE PATHOLOGY  MOTOR SPEECH EVALUATION   Patient Name: Sarah Duffy MRN: [id number] DOB:May 22, 1971, 53 y.o., female Today's Date: 03/08/2024  PCP: Carlynn Purl, MD  REFERRING PROVIDER: Malvin Johns, MD   End of Session - 03/08/24 1535     Visit Number 1    Number of Visits 24    Date for SLP Re-Evaluation 05/31/24    SLP Start Time 1450    SLP Stop Time  1530    SLP Time Calculation (min) 40 min    Activity Tolerance Patient tolerated treatment well             Patient Active Problem List   Diagnosis Date Noted   Gait abnormality 11/30/2023   Spinal stenosis of lumbar region 11/30/2023   Chronic kidney disease, stage 3a (HCC) 11/09/2023   Weakness of extremity 09/20/2023   Arthropathy of lumbar facet joint 07/29/2023   Low back pain 07/29/2023   Pain in finger of left hand 07/29/2023   Sacroiliac joint pain 07/29/2023   Contact dermatitis and eczema 06/08/2023   Dysphagia 03/23/2023   Asthma, well controlled 03/09/2023   Depression, major, in remission (HCC) 03/09/2023   Perennial allergic rhinitis with seasonal variation 03/09/2023   Type 2 diabetes mellitus with diabetic polyneuropathy, with long-term current use of insulin (HCC) 08/31/2022   Type 2 diabetes mellitus with retinopathy, with long-term current use of insulin (HCC) 08/31/2022   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(HCC) 08/31/2022   Hypertension 06/11/2022   Ischemic cardiomyopathy 06/11/2022   Coronary artery disease 06/11/2022   Chronic systolic heart failure (HCC) 06/11/2022   Angina pectoris associated with type 2 diabetes mellitus (HCC) 01/05/2022   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 edema associated with type 2 diabetes mellitus (HCC) 05/01/2021   Cardiomyopathy (HCC) 04/14/2021   History of 2019 novel coronavirus disease (COVID-19) 06/23/2019   Chronic migraine w/o aura w/o status migrainosus, not intractable 01/14/2018   Severe nonproliferative  diabetic retinopathy of left eye with macular edema associated with type 2 diabetes mellitus (HCC) 05/21/2016   Hyperlipidemia LDL goal <70 03/10/2016   MI (mitral incompetence) 02/25/2016   Insomnia 02/25/2016   Anxiety 09/04/2014    ONSET DATE: 03/07/24 (referral date)   REFERRING DIAG: speech disturbance  THERAPY DIAG:  Dysarthria and anarthria  Rationale for Evaluation and Treatment Rehabilitation  SUBJECTIVE:   SUBJECTIVE STATEMENT: Pt alert, pleasant, and cooperative.  Pt accompanied by: self  PERTINENT HISTORY: Pt with PMHx HTN, chronic back pain and migraines.  DIAGNOSTIC FINDINGS: MRI 01/03/24 "- Multiple small chronic cerebral microhemorrhages in the bilateral cerebral and cerebellar hemispheres.  This can be seen within the spectrum of chronic small vessel ischemic disease. - No acute findings."  PAIN:  Are you having pain? Yes: chronic low back pain   FALLS: Has patient fallen in last 6 months?  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), on leave from part time job at [employer]    PATIENT GOALS      for speech to improve  OBJECTIVE:  COGNITION: Overall cognitive status: Within functional limits for tasks assessed   MOTOR SPEECH: Overall motor speech: impaired Level of impairment: Word, Phrase, and Sentence Respiration: clavicular breathing Phonation: hoarse Resonance: WFL Articulation: Impaired: all levels mainly with /s/ and "sh" Intelligibility: Intelligible Motor planning: Appears intact  ORAL MOTOR EXAMINATION: Facial : WFL Lingual: Strength Impaired: Impaired right Velum:  WFL Mandible: WFL Cough: WFL Voice: Hoarse        ORAL MOTOR ASSESSMENT:   Impaired: Lingual: Strength Impaired: Impaired right Voice: Hoarse   PATIENT REPORTED OUTCOME MEASURES (PROM):  The Communication Effectiveness  Survey is a patient-reported outcome measure in which the patient rates their own effectiveness in different communication situations. A higher score indicates greater effectiveness.   Pt's self-rating was 23/32.   Having a conversation with a family member or friends at home. 3 Participating in conversation with strangers in a quiet place. 3 Conversing with a familiar person over the telephone. 3 Conversing with a stranger over the telephone. 3 Being part of a conversation in a noisy environment (social gathering). 3 Speaking to a friend when you are emotionally upset or you are angry. 2 Having a conversation while traveling in a car. 3 Having a conversation with someone at a distance (across a room). 3   TODAY'S TREATMENT: Education initiated re: role of SLP, results of assessment, communication strategies, drill work/HEP, and SLP POC. Pt stimulable for articulatory placement cueing during initial /s/ drill work. Min/mod cues needed initially for adequate lingual placement, reducing to rare cues and self-correction.      PATIENT EDUCATION: Education details: as above Person educated: Patient Education method: Explanation Education comprehension: verbalized understanding, returned demonstration, verbal cues required, and needs further education   HOME EXERCISE PROGRAM: /s/, /sk/, and /skr/ worksheets  GOALS: Goals reviewed with patient? Yes  SHORT TERM GOALS: Target date: 10 sessions  Pt will complete HEP for dysarthria with min cueing.  Baseline: Goal status: INITIAL  2.  Pt will complete head and neck stretches/massage to reduce vocal tension with min A.   Baseline:  Goal status: INITIAL  3.  With Min A, patient will use dysarthria compensations during sentence level drill work. Baseline:  Goal status: INITIAL  4.  Pt will utilize dysarthria compensations with min A during 5-8 minute conversation.  Baseline:  Goal status: INITIAL    LONG TERM GOALS: Target date:  12 weeks  Pt will utilize compensatory strategies for dysarthria with rare-min cues during 10-15 minute conversational sample. Baseline:  Goal status: INITIAL     ASSESSMENT:  CLINICAL IMPRESSION: Patient is a 53 y.o. female who was seen today for speech disturbance. Per Neurology note note, pt "reports slurred speech, particularly with words containing 's' sounds, which started in September or October of last year." Pt with PMHx HTN, chronic back pain and migraines. Pt presents with s/sx mild dysarthria with perceptual features of mildly hoarse vocal quality and articulatory imprecision particularly with /s/, /s/-blends, and "sh." Articulatory imprecision noted at all sound levels and in all positions; however, pt stimulable to articulatory placement cueing. Noted pt with R lingual weakness on oral motor examination and suspect this is affecting articulatory precision. Pt states that current speech difficulty is affecting QoL. Recommend course of ST to improve speech clarity and QoL.  OBJECTIVE IMPAIRMENTS include dysarthria and dysphonia. These impairments are limiting patient from effectively communicating at home and in community. Factors affecting potential to achieve goals and functional outcome are  unknown etiology . Patient will benefit from skilled SLP services to address above impairments and improve overall function.  REHAB POTENTIAL: Good - unknown etiology  PLAN: SLP FREQUENCY: 1-2x/week  SLP DURATION: 12 weeks  PLANNED INTERVENTIONS: Cueing hierachy, Internal/external aids, Functional tasks, SLP instruction and feedback, Compensatory strategies, and Patient/family education    Clyde Canterbury, M.S., CCC-SLP Speech-Language Pathologist [facility] (  [phone] Arnette Felts)  [facility] [address] [place], [place], [address] Phone: [phone]   Fax:   [phone]

## 2024-03-09 ENCOUNTER — Ambulatory Visit (INDEPENDENT_AMBULATORY_CARE_PROVIDER_SITE_OTHER): Admitting: Family Medicine

## 2024-03-09 ENCOUNTER — Encounter: Payer: Self-pay | Admitting: Family Medicine

## 2024-03-09 VITALS — BP 122/70 | HR 87 | Resp 16 | Ht 69.0 in | Wt 179.3 lb

## 2024-03-09 DIAGNOSIS — G894 Chronic pain syndrome: Secondary | ICD-10-CM

## 2024-03-09 DIAGNOSIS — E1169 Type 2 diabetes mellitus with other specified complication: Secondary | ICD-10-CM | POA: Diagnosis not present

## 2024-03-09 DIAGNOSIS — G43009 Migraine without aura, not intractable, without status migrainosus: Secondary | ICD-10-CM

## 2024-03-09 DIAGNOSIS — J3089 Other allergic rhinitis: Secondary | ICD-10-CM

## 2024-03-09 DIAGNOSIS — I2510 Atherosclerotic heart disease of native coronary artery without angina pectoris: Secondary | ICD-10-CM

## 2024-03-09 DIAGNOSIS — J4520 Mild intermittent asthma, uncomplicated: Secondary | ICD-10-CM

## 2024-03-09 DIAGNOSIS — E1159 Type 2 diabetes mellitus with other circulatory complications: Secondary | ICD-10-CM | POA: Diagnosis not present

## 2024-03-09 DIAGNOSIS — I5022 Chronic systolic (congestive) heart failure: Secondary | ICD-10-CM

## 2024-03-09 DIAGNOSIS — I209 Angina pectoris, unspecified: Secondary | ICD-10-CM

## 2024-03-09 DIAGNOSIS — I429 Cardiomyopathy, unspecified: Secondary | ICD-10-CM | POA: Diagnosis not present

## 2024-03-09 DIAGNOSIS — I152 Hypertension secondary to endocrine disorders: Secondary | ICD-10-CM | POA: Diagnosis not present

## 2024-03-09 DIAGNOSIS — N1831 Chronic kidney disease, stage 3a: Secondary | ICD-10-CM | POA: Diagnosis not present

## 2024-03-09 DIAGNOSIS — I2583 Coronary atherosclerosis due to lipid rich plaque: Secondary | ICD-10-CM | POA: Diagnosis not present

## 2024-03-09 DIAGNOSIS — M216X2 Other acquired deformities of left foot: Secondary | ICD-10-CM | POA: Diagnosis not present

## 2024-03-09 DIAGNOSIS — E1142 Type 2 diabetes mellitus with diabetic polyneuropathy: Secondary | ICD-10-CM | POA: Diagnosis not present

## 2024-03-09 DIAGNOSIS — M47816 Spondylosis without myelopathy or radiculopathy, lumbar region: Secondary | ICD-10-CM

## 2024-03-09 DIAGNOSIS — E785 Hyperlipidemia, unspecified: Secondary | ICD-10-CM

## 2024-03-09 DIAGNOSIS — M216X1 Other acquired deformities of right foot: Secondary | ICD-10-CM | POA: Diagnosis not present

## 2024-03-09 MED ORDER — ATORVASTATIN CALCIUM 80 MG PO TABS: 80.0000 mg | ORAL_TABLET | Freq: Every day | ORAL | 5 refills | Status: DC

## 2024-03-09 MED ORDER — AMLODIPINE BESYLATE 5 MG PO TABS: 5.0000 mg | ORAL_TABLET | Freq: Every day | ORAL | 5 refills | Status: DC

## 2024-03-09 MED ORDER — EZETIMIBE 10 MG PO TABS: 10.0000 mg | ORAL_TABLET | Freq: Every day | ORAL | 5 refills | Status: DC

## 2024-03-09 NOTE — Progress Notes (Signed)
 Name: Sarah Duffy   MRN: [id number]    DOB: 11/13/1971   Date:03/09/2024       Progress Note  Subjective  Chief Complaint  Chief Complaint  Patient presents with   Back Pain    worst    HPI  DMII: she is under the care of Dr. Lonzo Cloud in July and A1C was 8.4 % , she is currently taking Ozempic ( not on our records/advised her to contact Endo), Jardiance,Basaglar and Premeal insulin. She recently medrol dose pack due for back pain and glucose went really high. She has Dexcom and average is around 260 ( she could not open her app on the phone)  She has CAD, dyslipidemia and microalbuminuria. She is on statin therapy. Eye exam is up to date    HTN: bp is at goal today, she is taking medication as prescribed. Denies side effects    Migraines: resolved with Emgality , no recent episodes. Seeing neurologist   CKI stage IIIa: she is off NSAID's, she has good urine output . No pruritus    Asthma mild intermittent/AR: she has noticed some nasal congestion recently due to pollen count. Takes xyzal and ventolin prn    Facet arthropathy /spinal stenosis: currently going to [facility], Dr. Vear Clock and will have a nerve block, pain level is 8/10 , she denies numbness but feels deep in her lower back , not radiating down her legs, no bowel or bladder incontinence   CAD/Chronic systolic heart failure/Cardiomyopathy: under the care of Dr. Myriam Forehand, she went for second opinion at [facility] and also recommended CABG , she is now planning on having surgery done by Dr. Laneta Simmers.  She was ready to have CABG on 09/19/2021 but it was cancelled by the physicians due to personal problems followed by insurance problems,  after that recurrent migraines and now waiting for DM to get under better control. She has ischemic cardiomyopathy but currently no symptoms. Denies orthopnea, PND or lower extremity edema she has some SOB with activity. Denies chest pain at this time  Patient Active Problem List   Diagnosis Date Noted   Gait  abnormality 11/30/2023   Spinal stenosis of lumbar region 11/30/2023   Chronic kidney disease, stage 3a (HCC) 11/09/2023   Weakness of extremity 09/20/2023   Arthropathy of lumbar facet joint 07/29/2023   Low back pain 07/29/2023   Pain in finger of left hand 07/29/2023   Sacroiliac joint pain 07/29/2023   Contact dermatitis and eczema 06/08/2023   Dysphagia 03/23/2023   Asthma, well controlled 03/09/2023   Depression, major, in remission (HCC) 03/09/2023   Perennial allergic rhinitis with seasonal variation 03/09/2023   Type 2 diabetes mellitus with diabetic polyneuropathy, with long-term current use of insulin (HCC) 08/31/2022   Type 2 diabetes mellitus with retinopathy, with long-term current use of insulin (HCC) 08/31/2022   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(HCC) 08/31/2022   Hypertension 06/11/2022   Ischemic cardiomyopathy 06/11/2022   Coronary artery disease 06/11/2022   Chronic systolic heart failure (HCC) 06/11/2022   Angina pectoris associated with type 2 diabetes mellitus (HCC) 01/05/2022   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 edema associated with type 2 diabetes mellitus (HCC) 05/01/2021   Cardiomyopathy (HCC) 04/14/2021   History of 2019 novel coronavirus disease (COVID-19) 06/23/2019   Chronic migraine w/o aura w/o status migrainosus, not intractable 01/14/2018   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 associated with type 2 diabetes mellitus (HCC) 05/21/2016   Hyperlipidemia LDL goal <70 03/10/2016  MI (mitral incompetence) 02/25/2016   Insomnia 02/25/2016   Anxiety 09/04/2014    Social History   Tobacco Use   Smoking status: Never   Smokeless tobacco: Never  Substance Use Topics   Alcohol use: No    Alcohol/week: 0.0 standard drinks of alcohol     Current Outpatient Medications:    amLODipine (NORVASC) 5 MG tablet, TAKE 1 TABLET (5 MG TOTAL) BY MOUTH DAILY., Disp: 90 tablet, Rfl: 0    aspirin EC 81 MG tablet, Take by mouth., Disp: , Rfl:    atorvastatin (LIPITOR) 80 MG tablet, Take 1 tablet (80 mg total) by mouth daily., Disp: 90 tablet, Rfl: 1   Blood Glucose Monitoring Suppl (ACCU-CHEK GUIDE) w/Device KIT, Use to check blood sugar 2 time daily as needed, Disp: 1 kit, Rfl: 0   carvedilol (COREG) 12.5 MG tablet, TAKE 1 TABLET BY MOUTH 2 TIMES DAILY., Disp: 180 tablet, Rfl: 0   Continuous Glucose Sensor (DEXCOM G7 SENSOR) MISC, 1 DEVICE BY DOES NOT APPLY ROUTE AS DIRECTED., Disp: 3 each, Rfl: 11   ENTRESTO 24-26 MG, Take 1 tablet by mouth 2 (two) times daily., Disp: 60 tablet, Rfl: 3   ezetimibe (ZETIA) 10 MG tablet, Take 1 tablet (10 mg total) by mouth daily., Disp: 90 tablet, Rfl: 1   gabapentin (NEURONTIN) 400 MG capsule, Take by mouth., Disp: , Rfl:    Galcanezumab-gnlm (EMGALITY) 120 MG/ML SOSY, Inject into the skin., Disp: , Rfl:    glucose blood (ACCU-CHEK GUIDE) test strip, Check blood sugar 2 times daily as needed, Disp: 100 each, Rfl: 12   insulin aspart (NOVOLOG FLEXPEN) 100 UNIT/ML FlexPen, Max daily 30 units, Disp: 30 mL, Rfl: 3   Insulin Glargine (BASAGLAR KWIKPEN) 100 UNIT/ML, Inject 70 Units into the skin daily., Disp: 60 mL, Rfl: 3   Insulin Pen Needle 32G X 4 MM MISC, 1 Device by Does not apply route in the morning, at noon, in the evening, and at bedtime., Disp: 400 each, Rfl: 3   ipratropium (ATROVENT) 0.03 % nasal spray, Place 2 sprays into both nostrils 2 (two) times daily., Disp: 30 mL, Rfl: 2   JARDIANCE 25 MG TABS tablet, Take 1 tablet (25 mg total) by mouth daily., Disp: 90 tablet, Rfl: 2   Lancets (ACCU-CHEK MULTICLIX) lancets, Check sugar 2 times daily as needed., Disp: 100 each, Rfl: 12   levocetirizine (XYZAL) 5 MG tablet, Take 1 tablet (5 mg total) by mouth in the morning., Disp: 34 tablet, Rfl: 5   metaxalone (SKELAXIN) 800 MG tablet, TAKE 1 TABLET (800 MG TOTAL) BY MOUTH 3 (THREE) TIMES DAILY AS NEEDED FOR MUSCLE SPASMS., Disp: 90 tablet, Rfl: 0    naproxen (NAPROSYN) 500 MG tablet, , Disp: , Rfl:    nitroGLYCERIN (NITROSTAT) 0.4 MG SL tablet, PLACE 1 TABLET UNDER THE TONGUE EVERY 5 MINUTES AS NEEDED FOR CHEST PAIN., Disp: 25 tablet, Rfl: 0   OZEMPIC, 0.25 OR 0.5 MG/DOSE, 2 MG/3ML SOPN, , Disp: , Rfl:    prednisoLONE acetate (PRED FORTE) 1 % ophthalmic suspension, Place 1 drop into the left eye 4 (four) times daily., Disp: 10 mL, Rfl: 0   pregabalin (LYRICA) 75 MG capsule, Take 75 mg by mouth 3 (three) times daily., Disp: , Rfl:    Rimegepant Sulfate (NURTEC) 75 MG TBDP, Take 1 tablet (75 mg total) by mouth daily as needed. Max of every other day prn, Disp: 16 tablet, Rfl: 0   spironolactone (ALDACTONE) 25 MG tablet, Take 1 tablet (25 mg  total) by mouth daily., Disp: 90 tablet, Rfl: 3   triamcinolone cream (KENALOG) 0.1 %, Apply 1 Application topically 2 (two) times daily., Disp: 453.6 g, Rfl: 0   VENTOLIN HFA 108 (90 Base) MCG/ACT inhaler, INHALE 2 PUFFS BY MOUTH EVERY 4 HOURS AS NEEDED FOR WHEEZE OR FOR SHORTNESS OF BREATH, Disp: 18 each, Rfl: 1   Vitamin D, Ergocalciferol, (DRISDOL) 1.25 MG (50000 UNIT) CAPS capsule, Take 50,000 Units by mouth once a week. (Patient not taking: Reported on 03/09/2024), Disp: , Rfl:   No Known Allergies  ROS  Ten systems reviewed and is negative except as mentioned in HPI    Objective  Vitals:   03/09/24 1043  BP: 122/70  Pulse: 87  Resp: 16  SpO2: 99%  Weight: 179 lb 4.8 oz (81.3 kg)  Height: 5\' 9"  (1.753 m)    Body mass index is 26.48 kg/m. Physical Exam  Constitutional: Patient appears well-developed and well-nourished. No distress.  HEENT: head atraumatic, normocephalic, pupils equal and reactive to light, ears , neck supple normal limits Cardiovascular: Normal rate, regular rhythm and normal heart sounds.  No murmur heard. No BLE edema. Pulmonary/Chest: Effort normal and breath sounds normal. No respiratory distress. Abdominal: Soft.  There is no tenderness. Psychiatric: Patient has a  normal mood and affect. behavior is normal. Judgment and thought content normal.    Recent Results (from the past 2160 hours)  POCT HgB A1C     Status: Abnormal   Collection Time: 12/10/23  1:34 PM  Result Value Ref Range   Hemoglobin A1C 12.4 (A) 4.0 - 5.6 %   HbA1c POC (<> result, manual entry)     HbA1c, POC (prediabetic range)     HbA1c, POC (controlled diabetic range)    CBC with Differential/Platelet     Status: Abnormal   Collection Time: 12/10/23  2:06 PM  Result Value Ref Range   WBC 7.3 3.8 - 10.8 Thousand/uL   RBC 3.74 (L) 3.80 - 5.10 Million/uL   Hemoglobin 10.6 (L) 11.7 - 15.5 g/dL   HCT 60.4 (L) 54.0 - 98.1 %   MCV 90.9 80.0 - 100.0 fL   MCH 28.3 27.0 - 33.0 pg   MCHC 31.2 (L) 32.0 - 36.0 g/dL    Comment: For adults, a slight decrease in the calculated MCHC value (in the range of 30 to 32 g/dL) is most likely not clinically significant; however, it should be interpreted with caution in correlation with other red cell parameters and the patient's clinical condition.    RDW 11.8 11.0 - 15.0 %   Platelets 330 140 - 400 Thousand/uL   MPV 10.0 7.5 - 12.5 fL   Neutro Abs 5,220 1,500 - 7,800 cells/uL   Absolute Lymphocytes 1,219 850 - 3,900 cells/uL   Absolute Monocytes 613 200 - 950 cells/uL   Eosinophils Absolute 190 15 - 500 cells/uL   Basophils Absolute 58 0 - 200 cells/uL   Neutrophils Relative % 71.5 %   Total Lymphocyte 16.7 %   Monocytes Relative 8.4 %   Eosinophils Relative 2.6 %   Basophils Relative 0.8 %  Iron, TIBC and Ferritin Panel     Status: None   Collection Time: 12/10/23  2:06 PM  Result Value Ref Range   Iron 104 45 - 160 mcg/dL   TIBC 191 478 - 295 mcg/dL (calc)   %SAT 31 16 - 45 % (calc)   Ferritin 94 16 - 232 ng/mL  B12 and Folate Panel  Status: None   Collection Time: 12/10/23  2:06 PM  Result Value Ref Range   Vitamin B-12 578 200 - 1,100 pg/mL   Folate 9.7 ng/mL    Comment:                            Reference Range                             Low:           <3.4                            Borderline:    3.4-5.4                            Normal:        >5.4 .   COMPLETE METABOLIC PANEL WITH GFR     Status: Abnormal   Collection Time: 12/10/23  2:06 PM  Result Value Ref Range   Glucose, Bld 393 (H) 65 - 99 mg/dL    Comment: .            Fasting reference interval . For someone without known diabetes, a glucose value >125 mg/dL indicates that they may have diabetes and this should be confirmed with a follow-up test. .    BUN 29 (H) 7 - 25 mg/dL   Creat 4.13 (H) 2.44 - 1.03 mg/dL   eGFR 58 (L) > OR = 60 mL/min/1.90m2   BUN/Creatinine Ratio 26 (H) 6 - 22 (calc)   Sodium 132 (L) 135 - 146 mmol/L   Potassium 6.6 (HH) 3.5 - 5.3 mmol/L    Comment: Verified by repeat analysis. .    Chloride 101 98 - 110 mmol/L   CO2 23 20 - 32 mmol/L   Calcium 9.8 8.6 - 10.4 mg/dL   Total Protein 7.6 6.1 - 8.1 g/dL   Albumin 4.5 3.6 - 5.1 g/dL   Globulin 3.1 1.9 - 3.7 g/dL (calc)   AG Ratio 1.5 1.0 - 2.5 (calc)   Total Bilirubin 0.5 0.2 - 1.2 mg/dL   Alkaline phosphatase (APISO) 79 37 - 153 U/L   AST 19 10 - 35 U/L   ALT 15 6 - 29 U/L  Basic metabolic panel     Status: Abnormal   Collection Time: 12/11/23  2:16 PM  Result Value Ref Range   Sodium 131 (L) 135 - 145 mmol/L   Potassium 5.5 (H) 3.5 - 5.1 mmol/L   Chloride 101 98 - 111 mmol/L   CO2 19 (L) 22 - 32 mmol/L   Glucose, Bld 371 (H) 70 - 99 mg/dL    Comment: Glucose reference range applies only to samples taken after fasting for at least 8 hours.   BUN 33 (H) 6 - 20 mg/dL   Creatinine, Ser 0.10 (H) 0.44 - 1.00 mg/dL   Calcium 9.3 8.9 - 27.2 mg/dL   GFR, Estimated 51 (L) >60 mL/min    Comment: (NOTE) Calculated using the CKD-EPI Creatinine Equation (2021)    Anion gap 11 5 - 15    Comment: Performed at [facility], [address] [place], [place] [address]  CBC with Differential     Status: Abnormal   Collection Time: 12/11/23  2:16 PM   Result Value Ref Range   WBC  7.7 4.0 - 10.5 K/uL   RBC 3.80 (L) 3.87 - 5.11 MIL/uL   Hemoglobin 10.8 (L) 12.0 - 15.0 g/dL   HCT 16.1 (L) 09.6 - 04.5 %   MCV 88.2 80.0 - 100.0 fL   MCH 28.4 26.0 - 34.0 pg   MCHC 32.2 30.0 - 36.0 g/dL   RDW 40.9 81.1 - 91.4 %   Platelets 321 150 - 400 K/uL   nRBC 0.0 0.0 - 0.2 %   Neutrophils Relative % 67 %   Neutro Abs 5.2 1.7 - 7.7 K/uL   Lymphocytes Relative 22 %   Lymphs Abs 1.7 0.7 - 4.0 K/uL   Monocytes Relative 6 %   Monocytes Absolute 0.5 0.1 - 1.0 K/uL   Eosinophils Relative 3 %   Eosinophils Absolute 0.2 0.0 - 0.5 K/uL   Basophils Relative 1 %   Basophils Absolute 0.0 0.0 - 0.1 K/uL   Immature Granulocytes 1 %   Abs Immature Granulocytes 0.06 0.00 - 0.07 K/uL    Comment: Performed at [facility], [address] [place], [place] [address]  Hemoglobin A1c     Status: None   Collection Time: 01/07/24 12:00 AM  Result Value Ref Range   Hemoglobin A1C 10.6     Comment: [facility]     Assessment & Plan  1. Hypertension associated with diabetes (HCC) (Primary)  - amLODipine (NORVASC) 5 MG tablet; Take 1 tablet (5 mg total) by mouth daily.  Dispense: 30 tablet; Refill: 5  2. Chronic kidney disease, stage 3a (HCC)  Avoid nsaids, taking SGL-2 agonist   3. Dyslipidemia due to type 2 diabetes mellitus (HCC)  - atorvastatin (LIPITOR) 80 MG tablet; Take 1 tablet (80 mg total) by mouth daily.  Dispense: 30 tablet; Refill: 5  4. Angina pectoris associated with type 2 diabetes mellitus (HCC)  - ezetimibe (ZETIA) 10 MG tablet; Take 1 tablet (10 mg total) by mouth daily.  Dispense: 30 tablet; Refill: 5  5. Chronic systolic heart failure (HCC)  Continue medication  6. Cardiomyopathy, unspecified type (HCC)  Denies orthopnea  7. Chronic pain syndrome  She will have nerve block tomorrow  8. Migraine without aura and without status migrainosus, not intractable  Controlled   9. Coronary artery disease due to lipid rich  plaque  - atorvastatin (LIPITOR) 80 MG tablet; Take 1 tablet (80 mg total) by mouth daily.  Dispense: 30 tablet; Refill: 5  10. Arthropathy of lumbar facet joint  Under the care of pain clinic   11. Asthma, mild intermittent, well-controlled  controlled  12. Perennial allergic rhinitis  Continue medication

## 2024-03-10 DIAGNOSIS — M47816 Spondylosis without myelopathy or radiculopathy, lumbar region: Secondary | ICD-10-CM | POA: Diagnosis not present

## 2024-03-11 ENCOUNTER — Other Ambulatory Visit: Payer: Self-pay | Admitting: Nurse Practitioner

## 2024-03-13 DIAGNOSIS — D649 Anemia, unspecified: Secondary | ICD-10-CM | POA: Diagnosis not present

## 2024-03-13 DIAGNOSIS — Z794 Long term (current) use of insulin: Secondary | ICD-10-CM | POA: Diagnosis not present

## 2024-03-13 DIAGNOSIS — N1831 Chronic kidney disease, stage 3a: Secondary | ICD-10-CM | POA: Diagnosis not present

## 2024-03-13 DIAGNOSIS — E083593 Diabetes mellitus due to underlying condition with proliferative diabetic retinopathy without macular edema, bilateral: Secondary | ICD-10-CM | POA: Diagnosis not present

## 2024-03-13 DIAGNOSIS — E1165 Type 2 diabetes mellitus with hyperglycemia: Secondary | ICD-10-CM | POA: Diagnosis not present

## 2024-03-13 LAB — MICROALBUMIN / CREATININE URINE RATIO: Microalb Creat Ratio: 11.7

## 2024-03-13 NOTE — Telephone Encounter (Signed)
 This is a Educational psychologist pt

## 2024-03-14 DIAGNOSIS — D649 Anemia, unspecified: Secondary | ICD-10-CM | POA: Insufficient documentation

## 2024-03-15 ENCOUNTER — Other Ambulatory Visit: Payer: Self-pay | Admitting: Nurse Practitioner

## 2024-03-21 ENCOUNTER — Telehealth: Payer: Self-pay

## 2024-03-21 ENCOUNTER — Ambulatory Visit: Admitting: Internal Medicine

## 2024-03-21 ENCOUNTER — Ambulatory Visit: Attending: Neurology

## 2024-03-21 DIAGNOSIS — M5416 Radiculopathy, lumbar region: Secondary | ICD-10-CM | POA: Diagnosis not present

## 2024-03-21 DIAGNOSIS — M48061 Spinal stenosis, lumbar region without neurogenic claudication: Secondary | ICD-10-CM | POA: Diagnosis not present

## 2024-03-21 NOTE — Progress Notes (Deleted)
 Name: Sarah Duffy  MRN/ DOB: [id number], May 11, 1971   Age/ Sex: 53 y.o., female    PCP: [name], MD   Reason for Endocrinology Evaluation: Type 2 Diabetes Mellitus     Date of Initial Endocrinology Visit: 08/31/2022    PATIENT IDENTIFIER: [name] is a 53 y.o. female with a past medical history of t2DM, Dyslipidemia, CAD, CHF. The patient presented for initial endocrinology clinic visit on 08/31/2022 for consultative assistance with her diabetes management.    HPI: [name] was    Diagnosed with DM years ago  Prior Medications tried/Intolerance: Metformin, glipizide, switch to insulin in 2017 after a huge left foot infection . Was on prandial insulin at some point         Hemoglobin A1c has ranged from 7.9% in 2023, peaking at 13.0% in 2020.  Has hx of left foot debridement  in 2017, had another infection and injury in 2021 while walking on a seashell   On her initial visit to our clinic she had an A1c of 7.9%, she was on Basaglar, Victoza, and Jardiance.  We switched Basaglar to [employer], switched Victoza to Trulicity and continued Jardiance     SUBJECTIVE:   During the last visit (06/21/2023): A1c 8.4%   Today (03/21/24): Sarah Duffy is here for follow-up on diabetes management.  She has NOT been to our clinic in 9 months. She  checks her blood sugars multiple times daily. The patient has  had hypoglycemic episodes since the last clinic visit, which typically occur at night .   Denies nausea, vomiting  Denies constipation  diarrhea     HOME DIABETES REGIMEN: Jardiance 25 mg daily  Ozempic 0.25 mg once weekly Basaglar 70 units daily Ozempic 0.5 mg weekly NovoLog Per correction factor : BG-120/30)    Statin: yes ACE-I/ARB: yes   CONTINUOUS GLUCOSE MONITORING RECORD INTERPRETATION    Dates of Recording: 6/25-06/21/2023  Sensor description:dexcom  Results statistics:   CGM use % of time 93  Average and SD 197/65  Time in range  40  %  %  Time Above 180 38  % Time above 250 20  % Time Below target 1   Glycemic patterns summary: BG's trend down overnight, and increase during the day  Hyperglycemic episodes postprandial  Hypoglycemic episodes occurred at night  Overnight periods: BG's trend down   DIABETIC COMPLICATIONS: Microvascular complications:  Retinopathy, neuropathy (s/p eye injections 02/2023) Denies: CKD Last eye exam: Completed 02/2022  Macrovascular complications:  CAD Denies: PVD, CVA   PAST HISTORY: Past Medical History:  Past Medical History:  Diagnosis Date   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 02/2021 Echo: EF 35-40%, glob HK, GrI DD, nl RV fxn, mildly dil LA, mod MR; b. 06/2022 Echo: EF 50-55%, no rwma, GrII DD, nl RV fxn, RVSP , mildly dil LA, mild MR, AoV sclerosis.   CKD (chronic kidney disease), stage III (HCC)    Complication of anesthesia 03/23/2023   Possible gastroparesis based off food in stomach during EGD   Coronary artery disease    a. 04/2021 Cath: LM nl, LAD 70p/m, 26m, 80d, LCX 58m, OM1 40, OM2 99 (fills via collats from OM1), RCA 60d, RPDA 75.   COVID-19 2021   Hyperlipidemia LDL goal <70    Hypertension    Ischemic cardiomyopathy    a. 02/2021 Echo: EF 35-40%, glob HK, GrI DD; b. 06/2022 Echo: EF 50-55%.   Moderate mitral regurgitation  a. 02/2021 Echo: Mod MR.   MRSA infection within last 3 months 02/25/2016   Osteomyelitis of foot (HCC) 08/26/2016   Type II diabetes mellitus (HCC)    Past Surgical History:  Past Surgical History:  Procedure Laterality Date   CHOLECYSTECTOMY  1999   COLONOSCOPY WITH PROPOFOL N/A 03/25/2021   Procedure: COLONOSCOPY WITH PROPOFOL;  Surgeon: [name], MD;  Location: [facility] ENDOSCOPY;  Service: Gastroenterology;  Laterality: N/A;   COLONOSCOPY WITH PROPOFOL N/A 04/17/2021   Procedure: COLONOSCOPY WITH PROPOFOL;  Surgeon: [name], MD;  Location: [facility] ENDOSCOPY;  Service: Gastroenterology;   Laterality: N/A;   ESOPHAGOGASTRODUODENOSCOPY (EGD) WITH PROPOFOL N/A 03/25/2021   Procedure: ESOPHAGOGASTRODUODENOSCOPY (EGD) WITH PROPOFOL;  Surgeon: [name], MD;  Location: [facility] ENDOSCOPY;  Service: Gastroenterology;  Laterality: N/A;   ESOPHAGOGASTRODUODENOSCOPY (EGD) WITH PROPOFOL N/A 04/17/2021   Procedure: ESOPHAGOGASTRODUODENOSCOPY (EGD) WITH PROPOFOL;  Surgeon: [name], MD;  Location: [facility] ENDOSCOPY;  Service: Gastroenterology;  Laterality: N/A;   ESOPHAGOGASTRODUODENOSCOPY (EGD) WITH PROPOFOL N/A 03/23/2023   Procedure: ESOPHAGOGASTRODUODENOSCOPY (EGD) WITH PROPOFOL;  Surgeon: [name], MD;  Location: [facility] ENDOSCOPY;  Service: Gastroenterology;  Laterality: N/A;   RIGHT/LEFT HEART CATH AND CORONARY ANGIOGRAPHY Bilateral 04/14/2021   Procedure: RIGHT/LEFT HEART CATH AND CORONARY ANGIOGRAPHY;  Surgeon: Yvonne Kendall, MD;  Location: [facility] INVASIVE CV LAB;  Service: Cardiovascular;  Laterality: Bilateral;   TOE SURGERY Left 02/07/2016   Pinky Toe    Social History:  reports that she has never smoked. She has never used smokeless tobacco. She reports that she does not drink alcohol and does not use drugs. Family History:  Family History  Problem Relation Age of Onset   Diabetes Mother    Ulcers Mother    Heart disease Father    AAA (abdominal aortic aneurysm) Father    Diabetes Father    Hypertension Father    Stroke Father    Alzheimer's disease Father    Heart attack Sister    Seizures Brother    Diabetes Maternal Grandmother    Breast cancer Maternal Grandmother      HOME MEDICATIONS: Allergies as of 03/21/2024   No Known Allergies      Medication List        Accurate as of March 21, 2024  7:04 AM. If you have any questions, ask your nurse or doctor.          Accu-Chek Guide test strip Generic drug: glucose blood Check blood sugar 2 times daily as needed   Accu-Chek Guide w/Device Kit Use to check blood sugar 2 time daily as needed   accu-chek  multiclix lancets Check sugar 2 times daily as needed.   amLODipine 5 MG tablet Commonly known as: NORVASC Take 1 tablet (5 mg total) by mouth daily.   aspirin EC 81 MG tablet Take by mouth.   atorvastatin 80 MG tablet Commonly known as: LIPITOR Take 1 tablet (80 mg total) by mouth daily.   Basaglar KwikPen 100 UNIT/ML Inject 70 Units into the skin daily.   carvedilol 12.5 MG tablet Commonly known as: COREG TAKE 1 TABLET BY MOUTH 2 TIMES DAILY.   Dexcom G7 Sensor Misc 1 DEVICE BY DOES NOT APPLY ROUTE AS DIRECTED.   Emgality 120 MG/ML Sosy Generic drug: Galcanezumab-gnlm Inject into the skin.   Entresto 24-26 MG Generic drug: sacubitril-valsartan Take 1 tablet by mouth 2 (two) times daily.   estradiol cypionate 5 MG/ML injection Commonly known as: DEPO-ESTRADIOL   ezetimibe 10 MG tablet Commonly known as: ZETIA Take 1  tablet (10 mg total) by mouth daily.   gabapentin 400 MG capsule Commonly known as: NEURONTIN Take by mouth.   Insulin Pen Needle 32G X 4 MM Misc 1 Device by Does not apply route in the morning, at noon, in the evening, and at bedtime.   ipratropium 0.03 % nasal spray Commonly known as: ATROVENT Place 2 sprays into both nostrils 2 (two) times daily.   Jardiance 25 MG Tabs tablet Generic drug: empagliflozin Take 1 tablet (25 mg total) by mouth daily.   levocetirizine 5 MG tablet Commonly known as: XYZAL Take 1 tablet (5 mg total) by mouth in the morning.   nitroGLYCERIN 0.4 MG SL tablet Commonly known as: NITROSTAT PLACE 1 TABLET UNDER THE TONGUE EVERY 5 MINUTES AS NEEDED FOR CHEST PAIN.   NovoLOG FlexPen 100 UNIT/ML FlexPen Generic drug: insulin aspart Max daily 30 units   Nurtec 75 MG Tbdp Generic drug: Rimegepant Sulfate Take 1 tablet (75 mg total) by mouth daily as needed. Max of every other day prn   Ozempic (0.25 or 0.5 MG/DOSE) 2 MG/3ML Sopn Generic drug: Semaglutide(0.25 or 0.5MG /DOS)   Semaglutide (1 MG/DOSE) 4 MG/3ML  Sopn Inject into the skin.   prednisoLONE acetate 1 % ophthalmic suspension Commonly known as: PRED FORTE Place 1 drop into the left eye 4 (four) times daily.   spironolactone 25 MG tablet Commonly known as: ALDACTONE Take 1 tablet (25 mg total) by mouth daily.   triamcinolone cream 0.1 % Commonly known as: KENALOG Apply 1 Application topically 2 (two) times daily.   Ventolin HFA 108 (90 Base) MCG/ACT inhaler Generic drug: albuterol INHALE 2 PUFFS BY MOUTH EVERY 4 HOURS AS NEEDED FOR WHEEZE OR FOR SHORTNESS OF BREATH   Vitamin D (Ergocalciferol) 1.25 MG (50000 UNIT) Caps capsule Commonly known as: DRISDOL Take 50,000 Units by mouth once a week.         ALLERGIES: No Known Allergies   REVIEW OF SYSTEMS: A comprehensive ROS was conducted with the patient and is negative except as per HPI    OBJECTIVE:   VITAL SIGNS: LMP  (LMP Unknown)    PHYSICAL EXAM:  General: Pt appears well and is in NAD  Neck: General: Supple without adenopathy or carotid bruits. Thyroid: Thyroid size normal.  No goiter or nodules appreciated.   Lungs: Clear with good BS bilat with no rales, rhonchi, or wheezes  Heart: RRR   Abdomen:  soft, nontender  Extremities:  Lower extremities - No pretibial edema.   Neuro: MS is good with appropriate affect, pt is alert and Ox3    DM foot exam: 06/21/2023  The skin of the feet is intact without sores or ulcerations. The pedal pulses are 2+ on right and 2+ on left. The sensation is absent to a screening 5.07, 10 gram monofilament bilaterally   DATA REVIEWED:  Lab Results  Component Value Date   HGBA1C 10.6 01/07/2024   HGBA1C 12.4 (A) 12/10/2023   HGBA1C 8.4 (A) 06/21/2023    Latest Reference Range & Units 03/09/23 13:55  Sodium 135 - 146 mmol/L 140  Potassium 3.5 - 5.3 mmol/L 4.1  Chloride 98 - 110 mmol/L 108  CO2 20 - 32 mmol/L 24  Glucose 65 - 99 mg/dL 811 (H)  BUN 7 - 25 mg/dL 18  Creatinine 9.14 - 7.82 mg/dL 9.56  Calcium 8.6 -  21.3 mg/dL 9.1  BUN/Creatinine Ratio 6 - 22 (calc) SEE NOTE:  eGFR > OR = 60 mL/min/1.47m2 73  AG Ratio 1.0 -  2.5 (calc) 1.7  AST 10 - 35 U/L 15  ALT 6 - 29 U/L 18  Total Protein 6.1 - 8.1 g/dL 6.9  Total Bilirubin 0.2 - 1.2 mg/dL 1.2  Total CHOL/HDL Ratio <5.0 (calc) 3.1  Cholesterol <200 mg/dL 474  HDL Cholesterol > OR = 50 mg/dL 45 (L)  LDL Cholesterol (Calc) mg/dL (calc) 79  MICROALB/CREAT RATIO <30 mg/g creat 67 (H)  Non-HDL Cholesterol (Calc) <130 mg/dL (calc) 93  Triglycerides <150 mg/dL 64  (H): Data is abnormally high (L): Data is abnormally low  ASSESSMENT / PLAN / RECOMMENDATIONS:   1) Type 2 Diabetes Mellitus, Poorly  controlled, With retinopathic, neuropathic, microalbuminuria  and macrovascular   complications - Most recent A1c of 8.4 %. Goal A1c < 7.0 %.    Patient continues with hyperglycemia  She has been noted with hypoglycemia overnight, will decrease Basaglar as below  She is tolerating Ozempic, will increase dose as below  She has been inconsistent with taking NovoLog per correction scale , she was encouraged again to use correction scale before meals , for example, last night she had a BG reading of 350 mg/DL, patient stated she" only" had Pepsi and corn chips, we discussed that both of these items are high in starch, hence BG reading 350 Mg/DL, referral to our CDE has been placed for education regarding carbohydrate  MEDICATIONS: Decrease Basaglar 70 units daily Increase Ozempic 0.5 mg weekly Continue Jardiance 25 mg daily Continue  NovoLog Per correction factor : BG-120/30) TIDQAC  EDUCATION / INSTRUCTIONS: BG monitoring instructions: Patient is instructed to check her blood sugars 3 times a day, before each meal Call [facility] if: BG persistently < 70  I reviewed the Rule of 15 for the treatment of hypoglycemia in detail with the patient. Literature supplied.   2) Diabetic complications:  Eye: Does  have known diabetic retinopathy.   Neuro/ Feet: Does  have known diabetic peripheral neuropathy. Renal: Patient does not have known baseline CKD. She is  on an ACEI/ARB at present.       F/U in 4 months    Signed electronically by: Lyndle Herrlich, MD  [facility] Endocrinology  [facility] [address] [place] [place], [place] [address] Phone: [phone] FAX: [phone]   CC: [name], MD [address] [place] [address] Phone: [phone]  Fax: [phone]    Return to Endocrinology clinic as below: Future Appointments  Date Time Provider Department Center  03/21/2024 10:10 AM Darrol Brandenburg, [name], MD [facility] None  03/21/2024  2:45 PM Woodroe Chen, CCC-SLP [facility] None  03/23/2024  2:45 PM Woodroe Chen, CCC-SLP [facility] None  03/28/2024  2:00 PM Woodroe Chen, CCC-SLP [facility] None  03/30/2024  2:00 PM Woodroe Chen, CCC-SLP [facility] None  04/04/2024  1:15 PM Woodroe Chen, CCC-SLP [facility] None  04/06/2024  2:00 PM Woodroe Chen, CCC-SLP [facility] None  04/10/2024 10:40 AM [name], MD [facility]  04/11/2024  1:15 PM Woodroe Chen, CCC-SLP [facility] None  04/13/2024  2:00 PM Woodroe Chen, CCC-SLP [facility] None  04/17/2024  9:30 AM Levert Feinstein, MD [facility] None  09/11/2024 11:00 AM [name], MD [facility]

## 2024-03-21 NOTE — Telephone Encounter (Signed)
 Pt did not show for today's ST appointment. Attempted to contact pt via cell phone; however, pt did not answer and voice mailbox full. Pt's next scheduled appointment is Thursday, 03/23/24, at 1445. Further appointments may be canceled if pt does not show for next appointment.   Clyde Canterbury, M.S., CCC-SLP Speech-Language Pathologist [facility] [phone] (ASCOM)

## 2024-03-21 NOTE — Telephone Encounter (Signed)
 Pt contacted SLP via telephone and apologized for missing today's appointment. Pt did not realize she had one today. Pt reminded of next scheduled appointment and is planning to attend. Pt aware to contact rehab office if she needs to reschedule.  Clyde Canterbury, M.S., CCC-SLP Speech-Language Pathologist [facility] [phone] (ASCOM)

## 2024-03-22 ENCOUNTER — Other Ambulatory Visit: Payer: Self-pay

## 2024-03-22 ENCOUNTER — Emergency Department
Admission: EM | Admit: 2024-03-22 | Discharge: 2024-03-22 | Discharge status: Against Medical Advice | Attending: Emergency Medicine | Admitting: Emergency Medicine

## 2024-03-22 DIAGNOSIS — E785 Hyperlipidemia, unspecified: Secondary | ICD-10-CM | POA: Diagnosis not present

## 2024-03-22 DIAGNOSIS — R531 Weakness: Secondary | ICD-10-CM | POA: Insufficient documentation

## 2024-03-22 DIAGNOSIS — R29898 Other symptoms and signs involving the musculoskeletal system: Secondary | ICD-10-CM | POA: Diagnosis not present

## 2024-03-22 DIAGNOSIS — I255 Ischemic cardiomyopathy: Secondary | ICD-10-CM | POA: Diagnosis not present

## 2024-03-22 DIAGNOSIS — M48061 Spinal stenosis, lumbar region without neurogenic claudication: Secondary | ICD-10-CM | POA: Diagnosis not present

## 2024-03-22 DIAGNOSIS — J45909 Unspecified asthma, uncomplicated: Secondary | ICD-10-CM | POA: Diagnosis not present

## 2024-03-22 DIAGNOSIS — Z5321 Procedure and treatment not carried out due to patient leaving prior to being seen by health care provider: Secondary | ICD-10-CM | POA: Insufficient documentation

## 2024-03-22 DIAGNOSIS — E119 Type 2 diabetes mellitus without complications: Secondary | ICD-10-CM | POA: Diagnosis not present

## 2024-03-22 DIAGNOSIS — Z5982 Transportation insecurity: Secondary | ICD-10-CM | POA: Diagnosis not present

## 2024-03-22 DIAGNOSIS — G8929 Other chronic pain: Secondary | ICD-10-CM | POA: Insufficient documentation

## 2024-03-22 DIAGNOSIS — M545 Low back pain, unspecified: Secondary | ICD-10-CM | POA: Insufficient documentation

## 2024-03-22 DIAGNOSIS — R899 Unspecified abnormal finding in specimens from other organs, systems and tissues: Secondary | ICD-10-CM | POA: Diagnosis not present

## 2024-03-22 DIAGNOSIS — I2510 Atherosclerotic heart disease of native coronary artery without angina pectoris: Secondary | ICD-10-CM | POA: Diagnosis not present

## 2024-03-22 DIAGNOSIS — R42 Dizziness and giddiness: Secondary | ICD-10-CM | POA: Diagnosis not present

## 2024-03-22 DIAGNOSIS — Z5941 Food insecurity: Secondary | ICD-10-CM | POA: Diagnosis not present

## 2024-03-22 DIAGNOSIS — Z5989 Other problems related to housing and economic circumstances: Secondary | ICD-10-CM | POA: Diagnosis not present

## 2024-03-22 DIAGNOSIS — I159 Secondary hypertension, unspecified: Secondary | ICD-10-CM | POA: Diagnosis not present

## 2024-03-22 DIAGNOSIS — Z5986 Financial insecurity: Secondary | ICD-10-CM | POA: Diagnosis not present

## 2024-03-22 DIAGNOSIS — I5022 Chronic systolic (congestive) heart failure: Secondary | ICD-10-CM | POA: Diagnosis not present

## 2024-03-22 DIAGNOSIS — I429 Cardiomyopathy, unspecified: Secondary | ICD-10-CM | POA: Diagnosis not present

## 2024-03-22 NOTE — ED Notes (Signed)
 Patient called x3 with no answer from the lobby. Pt not visualized.

## 2024-03-22 NOTE — ED Triage Notes (Signed)
 First Nurse Note: Patient to ED from podiatrist office. Pt sent for chronic back pain with bilateral leg weakness. Denies loss of bowel or bladder.

## 2024-03-22 NOTE — ED Triage Notes (Signed)
 Pt sent from Physical Med at [facility] clinic to be further evaluated for chronic lower back pain and worsening weakness in BLE. Pt has been managing this for a year. 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 Pt denies incontinence or saddle anesthesia.

## 2024-03-23 ENCOUNTER — Ambulatory Visit

## 2024-03-23 ENCOUNTER — Other Ambulatory Visit: Payer: Self-pay | Admitting: Internal Medicine

## 2024-03-23 DIAGNOSIS — I429 Cardiomyopathy, unspecified: Secondary | ICD-10-CM

## 2024-03-27 ENCOUNTER — Ambulatory Visit: Admitting: Physician Assistant

## 2024-03-27 DIAGNOSIS — R29898 Other symptoms and signs involving the musculoskeletal system: Secondary | ICD-10-CM | POA: Diagnosis not present

## 2024-03-27 DIAGNOSIS — R2681 Unsteadiness on feet: Secondary | ICD-10-CM

## 2024-03-27 NOTE — Progress Notes (Signed)
 Patient was last seen by me in clinic in December 2024.  At the time she was having bilateral hip weakness and back pain.  She had already undergone physical therapy and continued to have weakness and pain.  We were especially worried about a neurologic primary reason for this.  We did ask the patient to see neurology for referral.  Unfortunately patient went to neurology as well as several other providers and has not been seen by anyone in our group for about 4 months.  She would like to reestablish care here and notes that she feels as though she has become progressively worse.  She states that she is having major issues with stairs including hip weakness bilaterally.  She feels that her pain is also becoming worse.  Plan for patient to come in to see Dr. Felipe Horton as soon as possible and I will coordinate this with our team.  Red flag symptoms were reviewed and she was instructed to go to the emergency room if she were to experience these.  I connected with  Sarah Duffy on 03/27/24 via telephone application and verified that I am speaking with the correct person using two identifiers.  I spoke to her while I was in clinic and she was in her private residence.  We spoke for 15 minutes.   I discussed the limitations of evaluation and management by telemedicine. The patient expressed understanding and agreed to proceed.

## 2024-03-28 ENCOUNTER — Encounter

## 2024-03-28 ENCOUNTER — Ambulatory Visit

## 2024-03-30 ENCOUNTER — Encounter
Admission: RE | Admit: 2024-03-30 | Discharge: 2024-03-30 | Disposition: A | Discharge status: Home / Self Care | Source: Ambulatory Visit | Attending: Internal Medicine | Admitting: Internal Medicine

## 2024-03-30 ENCOUNTER — Ambulatory Visit

## 2024-03-30 DIAGNOSIS — I429 Cardiomyopathy, unspecified: Secondary | ICD-10-CM | POA: Diagnosis not present

## 2024-03-30 MED ORDER — TECHNETIUM TC 99M TETROFOSMIN IV KIT
10.5300 | PACK | Freq: Once | INTRAVENOUS | Status: AC | PRN
  Administered 2024-03-30: 10.53 via INTRAVENOUS

## 2024-03-30 MED ORDER — REGADENOSON 0.4 MG/5ML IV SOLN
0.4000 mg | Freq: Once | INTRAVENOUS | Status: AC
  Administered 2024-03-30: 0.4 mg via INTRAVENOUS

## 2024-03-30 MED ORDER — TECHNETIUM TC 99M TETROFOSMIN IV KIT
34.1000 | PACK | Freq: Once | INTRAVENOUS | Status: AC | PRN
  Administered 2024-03-30: 34.1 via INTRAVENOUS

## 2024-03-30 NOTE — Progress Notes (Signed)
 Referring Physician:  [name], Krichna, MD [address] [place],  [place] [address]  Primary Physician:  Arleen Lacer, MD  History of Present Illness: 04/03/2024 Sarah Duffy is following up today for her bilateral hip weakness and difficulty with ambulation.  She states that she has not had any improvements, but feels like she continues to worsen.  She states that it started in her bilateral lower extremities maybe the right side worse than the left, however her pain is starting to descend up to her mid back below her shoulder blades.  She denies any worsening bowel or bladder issues or saddle anesthesia.  She continues to have difficulty.  The symptoms are causing a significant impact on the patient's life.   Review of Systems:  A 10 point review of systems is negative, except for the pertinent positives and negatives detailed in the HPI.  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 02/2021 Echo: EF 35-40%, glob HK, GrI DD, nl RV fxn, mildly dil LA, mod MR; b. 06/2022 Echo: EF 50-55%, no rwma, GrII DD, nl RV fxn, RVSP , mildly dil LA, mild MR, AoV sclerosis.   CKD (chronic kidney disease), stage III (HCC)    Complication of anesthesia 03/23/2023   Possible gastroparesis based off food in stomach during EGD   Coronary artery disease    a. 04/2021 Cath: LM nl, LAD 70p/m, 18m, 80d, LCX 37m, OM1 40, OM2 99 (fills via collats from OM1), RCA 60d, RPDA 75.   COVID-19 2021   Hyperlipidemia LDL goal <70    Hypertension    Ischemic cardiomyopathy    a. 02/2021 Echo: EF 35-40%, glob HK, GrI DD; b. 06/2022 Echo: EF 50-55%.   Moderate mitral regurgitation    a. 02/2021 Echo: Mod MR.   MRSA infection within last 3 months 02/25/2016   Osteomyelitis of foot (HCC) 08/26/2016   Type II diabetes mellitus (HCC)     Past Surgical History: Past Surgical History:  Procedure Laterality Date    CHOLECYSTECTOMY  1999   COLONOSCOPY WITH PROPOFOL  N/A 03/25/2021   Procedure: COLONOSCOPY WITH PROPOFOL ;  Surgeon: Luke Salaam, MD;  Location: [facility] ENDOSCOPY;  Service: Gastroenterology;  Laterality: N/A;   COLONOSCOPY WITH PROPOFOL  N/A 04/17/2021   Procedure: COLONOSCOPY WITH PROPOFOL ;  Surgeon: Luke Salaam, MD;  Location: [facility] ENDOSCOPY;  Service: Gastroenterology;  Laterality: N/A;   ESOPHAGOGASTRODUODENOSCOPY (EGD) WITH PROPOFOL  N/A 03/25/2021   Procedure: ESOPHAGOGASTRODUODENOSCOPY (EGD) WITH PROPOFOL ;  Surgeon: Luke Salaam, MD;  Location: [facility] ENDOSCOPY;  Service: Gastroenterology;  Laterality: N/A;   ESOPHAGOGASTRODUODENOSCOPY (EGD) WITH PROPOFOL  N/A 04/17/2021   Procedure: ESOPHAGOGASTRODUODENOSCOPY (EGD) WITH PROPOFOL ;  Surgeon: Luke Salaam, MD;  Location: [facility] ENDOSCOPY;  Service: Gastroenterology;  Laterality: N/A;   ESOPHAGOGASTRODUODENOSCOPY (EGD) WITH PROPOFOL  N/A 03/23/2023   Procedure: ESOPHAGOGASTRODUODENOSCOPY (EGD) WITH PROPOFOL ;  Surgeon: Luke Salaam, MD;  Location: [facility] ENDOSCOPY;  Service: Gastroenterology;  Laterality: N/A;   RIGHT/LEFT HEART CATH AND CORONARY ANGIOGRAPHY Bilateral 04/14/2021   Procedure: RIGHT/LEFT HEART CATH AND CORONARY ANGIOGRAPHY;  Surgeon: Sammy Crisp, MD;  Location: [facility] INVASIVE CV LAB;  Service: Cardiovascular;  Laterality: Bilateral;   TOE SURGERY Left 02/07/2016   Pinky Toe    Allergies: Allergies as of 04/03/2024   (No Known Allergies)    Medications: Outpatient Encounter Medications as of 04/03/2024  Medication Sig   amLODipine  (NORVASC ) 5 MG tablet Take 1 tablet (5 mg total) by mouth daily.  aspirin  EC 81 MG tablet Take by mouth.   atorvastatin  (LIPITOR) 80 MG tablet Take 1 tablet (80 mg total) by mouth daily.   Blood Glucose Monitoring Suppl (ACCU-CHEK GUIDE) w/Device KIT Use to check blood sugar 2 time daily as needed   carvedilol  (COREG ) 12.5 MG tablet TAKE 1 TABLET BY MOUTH 2 TIMES DAILY.   Continuous Glucose Sensor (DEXCOM G7  SENSOR) MISC 1 DEVICE BY DOES NOT APPLY ROUTE AS DIRECTED.   ENTRESTO  24-26 MG Take 1 tablet by mouth 2 (two) times daily.   estradiol cypionate (DEPO-ESTRADIOL) 5 MG/ML injection    ezetimibe  (ZETIA ) 10 MG tablet Take 1 tablet (10 mg total) by mouth daily.   gabapentin (NEURONTIN) 400 MG capsule Take by mouth.   Galcanezumab -gnlm (EMGALITY ) 120 MG/ML SOSY Inject into the skin.   glucose blood (ACCU-CHEK GUIDE) test strip Check blood sugar 2 times daily as needed   insulin  aspart (NOVOLOG  FLEXPEN) 100 UNIT/ML FlexPen Max daily 30 units   Insulin  Glargine (BASAGLAR  KWIKPEN) 100 UNIT/ML Inject 70 Units into the skin daily.   Insulin  Pen Needle 32G X 4 MM MISC 1 Device by Does not apply route in the morning, at noon, in the evening, and at bedtime.   ipratropium (ATROVENT ) 0.03 % nasal spray Place 2 sprays into both nostrils 2 (two) times daily.   JARDIANCE  25 MG TABS tablet Take 1 tablet (25 mg total) by mouth daily.   Lancets (ACCU-CHEK MULTICLIX) lancets Check sugar 2 times daily as needed.   levocetirizine (XYZAL ) 5 MG tablet Take 1 tablet (5 mg total) by mouth in the morning.   nitroGLYCERIN  (NITROSTAT ) 0.4 MG SL tablet PLACE 1 TABLET UNDER THE TONGUE EVERY 5 MINUTES AS NEEDED FOR CHEST PAIN.   nortriptyline  (PAMELOR ) 10 MG capsule Take 10 mg by mouth at bedtime.   OZEMPIC , 0.25 OR 0.5 MG/DOSE, 2 MG/3ML SOPN    prednisoLONE  acetate (PRED FORTE ) 1 % ophthalmic suspension Place 1 drop into the left eye 4 (four) times daily.   Rimegepant Sulfate (NURTEC) 75 MG TBDP Take 1 tablet (75 mg total) by mouth daily as needed. Max of every other day prn   Semaglutide , 1 MG/DOSE, 4 MG/3ML SOPN Inject into the skin.   spironolactone  (ALDACTONE ) 25 MG tablet Take 1 tablet (25 mg total) by mouth daily.   triamcinolone  cream (KENALOG ) 0.1 % Apply 1 Application topically 2 (two) times daily.   VENTOLIN  HFA 108 (90 Base) MCG/ACT inhaler INHALE 2 PUFFS BY MOUTH EVERY 4 HOURS AS NEEDED FOR WHEEZE OR FOR SHORTNESS  OF BREATH   Vitamin D, Ergocalciferol, (DRISDOL) 1.25 MG (50000 UNIT) CAPS capsule Take 50,000 Units by mouth once a week.   No facility-administered encounter medications on file as of 04/03/2024.    Social History: Social History   Tobacco Use   Smoking status: Never   Smokeless tobacco: Never  Vaping Use   Vaping status: Never Used  Substance Use Topics   Alcohol use: No    Alcohol/week: 0.0 standard drinks of alcohol   Drug use: No    Family Medical History: Family History  Problem Relation Age of Onset   Diabetes Mother    Ulcers Mother    Heart disease Father    AAA (abdominal aortic aneurysm) Father    Diabetes Father    Hypertension Father    Stroke Father    Alzheimer's disease Father    Heart attack Sister    Seizures Brother    Diabetes Maternal Grandmother  Breast cancer Maternal Grandmother     Physical Examination:   General: Patient is well developed, well nourished, calm, collected, and in no apparent distress. Attention to examination is appropriate.  Psychiatric: Patient is non-anxious.  ENT:  Oral mucosa appears well hydrated.  Neck:   Supple.  Full range of motion.  Respiratory: Patient is breathing without any difficulty.  Extremities: No edema.  Vascular: Palpable dorsal pedal pulses.  Skin:   On exposed skin, there are no abnormal skin lesions.  NEUROLOGICAL:     Awake, alert, oriented to person, place, and time.  Speech is clear and fluent. Fund of knowledge is appropriate.   Cranial Nerves: Pupils equal round and reactive to light.    Patient is tender to palpation of her lumbar paraspinals.  Strength:  Side Iliopsoas Quads Hamstring PF DF EHL  R 2 5 5 5 4 5   L 2 5 5 5 4 5    Reflexes are hyporeflexic throughout.  Hoffman's is absent.  Clonus is not present.    Medical Decision Making  Imaging: MRI Lumbar Spine: IMPRESSION: 1. Multilevel degenerative changes of the lumbar spine as described above. Severe spinal  canal and bilateral lateral recess stenosis at L4-L5. 2. Moderate to severe spinal canal and bilateral lateral recess stenosis at L3-L4 and L5-S1.  I have personally reviewed the images and agree with the above interpretation.  Assessment and Plan: Sarah Duffy is a pleasant 53 y.o. female with chief complaint of bilateral hip weakness.  She states in April she started to have back pain that first extended into her right hip and is now into her left hip.  She feels as though it is due to sudden weakness with bilateral hip flexion.  She states she has to lift up her legs to get out of the bathtub or go up stairs.  She adds that her gait has been severely altered and she oftentimes stumbles.  She states that she continues to have difficulty with ambulation.  She does feel like her pain has started to continue going up her back and now is in her mid back underneath her shoulder blades.  She does have severe diabetes, her A1c is generally in the tens, and has had this for more than 10 years, it is likely that she has severe peripheral polyneuropathy that was seen on her previous EMG nerve conduction study and that this would negate any presence of hyperreflexia that we normally see in myelopathy.  Given the fact that she is starting to get some ascending symptoms into the thoracic region we feel that she would benefit from a MRI of her cervical thoracic spine to evaluate for any more proximal lesions as hip and dorsiflexion weakness can sometimes be seen in upper motor neuron compressive disease such as cervical or thoracic myelopathy. Will plan to have her come back after the MRIs are done.  They have been ordered.  Thank you for involving me in the care of this patient.   [facility] of Neuro[facility]  Spent a total of 30 minutes on her care today reviewing her charts, face-to-face evaluation, planning for her care going forward.

## 2024-04-03 ENCOUNTER — Ambulatory Visit: Admitting: Neurosurgery

## 2024-04-03 ENCOUNTER — Encounter: Payer: Self-pay | Admitting: Neurosurgery

## 2024-04-03 VITALS — BP 120/72 | Ht 69.0 in | Wt 184.0 lb

## 2024-04-03 DIAGNOSIS — M549 Dorsalgia, unspecified: Secondary | ICD-10-CM | POA: Diagnosis not present

## 2024-04-03 DIAGNOSIS — R29898 Other symptoms and signs involving the musculoskeletal system: Secondary | ICD-10-CM | POA: Insufficient documentation

## 2024-04-03 DIAGNOSIS — R2681 Unsteadiness on feet: Secondary | ICD-10-CM | POA: Diagnosis not present

## 2024-04-03 DIAGNOSIS — E1142 Type 2 diabetes mellitus with diabetic polyneuropathy: Secondary | ICD-10-CM | POA: Diagnosis not present

## 2024-04-03 DIAGNOSIS — M5416 Radiculopathy, lumbar region: Secondary | ICD-10-CM | POA: Diagnosis not present

## 2024-04-04 ENCOUNTER — Ambulatory Visit

## 2024-04-04 ENCOUNTER — Telehealth: Payer: Self-pay

## 2024-04-04 DIAGNOSIS — M5416 Radiculopathy, lumbar region: Secondary | ICD-10-CM | POA: Diagnosis not present

## 2024-04-04 NOTE — Telephone Encounter (Signed)
 Pt no showed for today's ST appointment. Pt has no showed x2 and has canceled all other scheduled appointments since evaluation on 03/08/24. Per departmental policy, all remaining ST appointments have been canceled, and pt will be d/c'd from ST services. Should pt wish to resume ST, pt will need a new physician's order.   Dia Forget, M.S., CCC-SLP Speech-Language Pathologist [facility] [phone] (ASCOM)

## 2024-04-05 ENCOUNTER — Encounter (INDEPENDENT_AMBULATORY_CARE_PROVIDER_SITE_OTHER): Admitting: Ophthalmology

## 2024-04-05 DIAGNOSIS — R2689 Other abnormalities of gait and mobility: Secondary | ICD-10-CM | POA: Diagnosis not present

## 2024-04-05 DIAGNOSIS — M545 Low back pain, unspecified: Secondary | ICD-10-CM | POA: Diagnosis not present

## 2024-04-05 DIAGNOSIS — G44229 Chronic tension-type headache, not intractable: Secondary | ICD-10-CM | POA: Diagnosis not present

## 2024-04-05 DIAGNOSIS — G8929 Other chronic pain: Secondary | ICD-10-CM | POA: Diagnosis not present

## 2024-04-05 DIAGNOSIS — R42 Dizziness and giddiness: Secondary | ICD-10-CM | POA: Diagnosis not present

## 2024-04-05 DIAGNOSIS — R531 Weakness: Secondary | ICD-10-CM | POA: Diagnosis not present

## 2024-04-05 DIAGNOSIS — G629 Polyneuropathy, unspecified: Secondary | ICD-10-CM | POA: Diagnosis not present

## 2024-04-06 ENCOUNTER — Telehealth: Payer: Self-pay | Admitting: Gastroenterology

## 2024-04-06 ENCOUNTER — Ambulatory Visit

## 2024-04-06 DIAGNOSIS — I429 Cardiomyopathy, unspecified: Secondary | ICD-10-CM | POA: Diagnosis not present

## 2024-04-06 NOTE — Telephone Encounter (Signed)
 Pt requesting refill for linzess  to be called into [facility] [address] [place]

## 2024-04-06 NOTE — Telephone Encounter (Addendum)
 Called patient but she did not answer and was not able to leave her a voicemail. I then called [facility] pharmacy in [place] and they couldn't find her last Linzess  refill. Therefore, I was not able to send her the Linzess  refill since the last time a medication was prescribed was Trulance .  I will try to contact the patient again.

## 2024-04-07 DIAGNOSIS — G4733 Obstructive sleep apnea (adult) (pediatric): Secondary | ICD-10-CM | POA: Diagnosis not present

## 2024-04-08 ENCOUNTER — Ambulatory Visit
Admission: RE | Admit: 2024-04-08 | Discharge: 2024-04-08 | Disposition: A | Discharge status: Home / Self Care | Source: Ambulatory Visit | Attending: Neurosurgery | Admitting: Neurosurgery

## 2024-04-08 DIAGNOSIS — M4804 Spinal stenosis, thoracic region: Secondary | ICD-10-CM | POA: Diagnosis not present

## 2024-04-08 DIAGNOSIS — R2681 Unsteadiness on feet: Secondary | ICD-10-CM | POA: Insufficient documentation

## 2024-04-08 DIAGNOSIS — M5134 Other intervertebral disc degeneration, thoracic region: Secondary | ICD-10-CM | POA: Diagnosis not present

## 2024-04-08 DIAGNOSIS — M5030 Other cervical disc degeneration, unspecified cervical region: Secondary | ICD-10-CM | POA: Diagnosis not present

## 2024-04-08 DIAGNOSIS — M4802 Spinal stenosis, cervical region: Secondary | ICD-10-CM | POA: Diagnosis not present

## 2024-04-08 DIAGNOSIS — R29898 Other symptoms and signs involving the musculoskeletal system: Secondary | ICD-10-CM | POA: Insufficient documentation

## 2024-04-08 DIAGNOSIS — M549 Dorsalgia, unspecified: Secondary | ICD-10-CM | POA: Diagnosis present

## 2024-04-08 DIAGNOSIS — M5124 Other intervertebral disc displacement, thoracic region: Secondary | ICD-10-CM | POA: Diagnosis not present

## 2024-04-08 DIAGNOSIS — M50222 Other cervical disc displacement at C5-C6 level: Secondary | ICD-10-CM | POA: Diagnosis not present

## 2024-04-10 ENCOUNTER — Encounter: Payer: Self-pay | Admitting: Family Medicine

## 2024-04-10 ENCOUNTER — Ambulatory Visit: Payer: Self-pay | Admitting: Family Medicine

## 2024-04-10 ENCOUNTER — Encounter: Payer: Self-pay | Admitting: Neurosurgery

## 2024-04-10 VITALS — BP 114/66 | HR 89 | Resp 16 | Ht 69.0 in | Wt 185.5 lb

## 2024-04-10 DIAGNOSIS — E785 Hyperlipidemia, unspecified: Secondary | ICD-10-CM | POA: Diagnosis not present

## 2024-04-10 DIAGNOSIS — E1169 Type 2 diabetes mellitus with other specified complication: Secondary | ICD-10-CM

## 2024-04-10 DIAGNOSIS — R2681 Unsteadiness on feet: Secondary | ICD-10-CM | POA: Diagnosis not present

## 2024-04-10 DIAGNOSIS — M47816 Spondylosis without myelopathy or radiculopathy, lumbar region: Secondary | ICD-10-CM

## 2024-04-10 NOTE — Telephone Encounter (Signed)
 Called patient again and couldn't leave her a voicemail. I saw that the patient is active on MyChart. Therefore, I will send her a message.

## 2024-04-10 NOTE — Progress Notes (Signed)
 Name: Sarah Duffy   MRN: [id number]    DOB: 02/15/1971   Date:04/10/2024       Progress Note  Subjective  Chief Complaint  Chief Complaint  Patient presents with   Medical Management of Chronic Issues   Discussed the use of AI scribe software for clinical note transcription with the patient, who gave verbal consent to proceed.  History of Present Illness Sarah Duffy is a 53 year old female with diabetes and coronary artery disease who presents with gait and stability issues.  She experiences ongoing hip weakness, which has worsened over the past year, significantly affecting her balance. The weakness has shifted from the left side to the right side, with more impact on the right hip. She now uses a cane for support, obtained from the [employer] about a week ago. She has been evaluated by an orthopedic specialist who ordered MRIs of her cervical and thoracic spine.  She has a history of neuropathy and chronic bilateral low back pain, with previous episodes of left-sided weakness and imbalance. She has undergone several MRIs and CT scans, revealing spinal stenosis and degenerative disc disease. She recently had MRI c-spine and thoracic spine but results are pending  Her diabetes management includes Ozempic  1 mg, Jardiance  25 mg, Basaglar  70 units, and Novolog  before meals. Her blood sugar is improving but not yet at target levels. Recent lab work showed a glucose level of 177, C-peptide of 4.3, normal GAD65 antibodies, and normal urine protein.  She has polyphagia but denies other symptoms   She is on vitamin D supplementation, taking 50,000 IU as prescribed.  She has coronary artery disease and was advised to undergo coronary artery bypass surgery in 2022, which has been postponed due to her back issues and uncontrolled diabetes. She experiences occasional throat tightness and indigestion but no current chest pain or shortness of breath. She has cardiomyopathy but denies PND or  orthopnea     Patient Active Problem List   Diagnosis Date Noted   Hip weakness 04/03/2024   Gait instability 04/03/2024   Chronic anemia 03/14/2024   Gait abnormality 11/30/2023   Spinal stenosis of lumbar region 11/30/2023   Chronic kidney disease, stage 3a (HCC) 11/09/2023   Weakness of extremity 09/20/2023   Arthropathy of lumbar facet joint 07/29/2023   Low back pain 07/29/2023   Pain in finger of left hand 07/29/2023   Sacroiliac joint pain 07/29/2023   Contact dermatitis and eczema 06/08/2023   Dysphagia 03/23/2023   Asthma, well controlled 03/09/2023   Depression, major, in remission (HCC) 03/09/2023   Perennial allergic rhinitis with seasonal variation 03/09/2023   Type 2 diabetes mellitus with diabetic polyneuropathy, with long-term current use of insulin  (HCC) 08/31/2022   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(HCC) 08/31/2022   Hypertension 06/11/2022   Ischemic cardiomyopathy 06/11/2022   Coronary artery disease 06/11/2022   Chronic systolic heart failure (HCC) 06/11/2022   Angina pectoris associated with type 2 diabetes mellitus (HCC) 01/05/2022   Cardiomyopathy (HCC) 04/14/2021   History of 2019 novel coronavirus disease (COVID-19) 06/23/2019   Chronic migraine w/o aura w/o status migrainosus, not intractable 01/14/2018   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 associated with type 2 diabetes mellitus (HCC) 05/21/2016   Hyperlipidemia LDL goal <70 03/10/2016   MI (mitral incompetence) 02/25/2016   Insomnia 02/25/2016   Anxiety 09/04/2014    Past Surgical History:  Procedure Laterality Date   CHOLECYSTECTOMY  1999   COLONOSCOPY WITH PROPOFOL  N/A 03/25/2021  Procedure: COLONOSCOPY WITH PROPOFOL ;  Surgeon: Luke Salaam, MD;  Location: [facility] ENDOSCOPY;  Service: Gastroenterology;  Laterality: N/A;   COLONOSCOPY WITH PROPOFOL  N/A 04/17/2021   Procedure: COLONOSCOPY WITH PROPOFOL ;  Surgeon: Luke Salaam, MD;  Location: [facility]  ENDOSCOPY;  Service: Gastroenterology;  Laterality: N/A;   ESOPHAGOGASTRODUODENOSCOPY (EGD) WITH PROPOFOL  N/A 03/25/2021   Procedure: ESOPHAGOGASTRODUODENOSCOPY (EGD) WITH PROPOFOL ;  Surgeon: Luke Salaam, MD;  Location: [facility] ENDOSCOPY;  Service: Gastroenterology;  Laterality: N/A;   ESOPHAGOGASTRODUODENOSCOPY (EGD) WITH PROPOFOL  N/A 04/17/2021   Procedure: ESOPHAGOGASTRODUODENOSCOPY (EGD) WITH PROPOFOL ;  Surgeon: Luke Salaam, MD;  Location: [facility] ENDOSCOPY;  Service: Gastroenterology;  Laterality: N/A;   ESOPHAGOGASTRODUODENOSCOPY (EGD) WITH PROPOFOL  N/A 03/23/2023   Procedure: ESOPHAGOGASTRODUODENOSCOPY (EGD) WITH PROPOFOL ;  Surgeon: Luke Salaam, MD;  Location: [facility] ENDOSCOPY;  Service: Gastroenterology;  Laterality: N/A;   RIGHT/LEFT HEART CATH AND CORONARY ANGIOGRAPHY Bilateral 04/14/2021   Procedure: RIGHT/LEFT HEART CATH AND CORONARY ANGIOGRAPHY;  Surgeon: Sammy Crisp, MD;  Location: [facility] INVASIVE CV LAB;  Service: Cardiovascular;  Laterality: Bilateral;   TOE SURGERY Left 02/07/2016   Pinky Toe    Family History  Problem Relation Age of Onset   Diabetes Mother    Ulcers Mother    Heart disease Father    AAA (abdominal aortic aneurysm) Father    Diabetes Father    Hypertension Father    Stroke Father    Alzheimer's disease Father    Heart attack Sister    Seizures Brother    Diabetes Maternal Grandmother    Breast cancer Maternal Grandmother     Social History   Tobacco Use   Smoking status: Never   Smokeless tobacco: Never  Substance Use Topics   Alcohol use: No    Alcohol/week: 0.0 standard drinks of alcohol     Current Outpatient Medications:    amLODipine  (NORVASC ) 5 MG tablet, Take 1 tablet (5 mg total) by mouth daily., Disp: 30 tablet, Rfl: 5   aspirin  EC 81 MG tablet, Take by mouth., Disp: , Rfl:    atorvastatin  (LIPITOR) 80 MG tablet, Take 1 tablet (80 mg total) by mouth daily., Disp: 30 tablet, Rfl: 5   Blood Glucose Monitoring Suppl (ACCU-CHEK GUIDE) w/Device  KIT, Use to check blood sugar 2 time daily as needed, Disp: 1 kit, Rfl: 0   carvedilol  (COREG ) 12.5 MG tablet, TAKE 1 TABLET BY MOUTH 2 TIMES DAILY., Disp: 180 tablet, Rfl: 2   Continuous Glucose Sensor (DEXCOM G7 SENSOR) MISC, 1 DEVICE BY DOES NOT APPLY ROUTE AS DIRECTED., Disp: 3 each, Rfl: 11   ENTRESTO  24-26 MG, Take 1 tablet by mouth 2 (two) times daily., Disp: 60 tablet, Rfl: 3   estradiol cypionate (DEPO-ESTRADIOL) 5 MG/ML injection, , Disp: , Rfl:    ezetimibe  (ZETIA ) 10 MG tablet, Take 1 tablet (10 mg total) by mouth daily., Disp: 30 tablet, Rfl: 5   gabapentin (NEURONTIN) 600 MG tablet, Take 600 mg by mouth 3 (three) times daily., Disp: , Rfl:    Galcanezumab -gnlm (EMGALITY ) 120 MG/ML SOSY, Inject into the skin., Disp: , Rfl:    glucose blood (ACCU-CHEK GUIDE) test strip, Check blood sugar 2 times daily as needed, Disp: 100 each, Rfl: 12   insulin  aspart (NOVOLOG  FLEXPEN) 100 UNIT/ML FlexPen, Max daily 30 units, Disp: 30 mL, Rfl: 3   Insulin  Glargine (BASAGLAR  KWIKPEN) 100 UNIT/ML, Inject 70 Units into the skin daily., Disp: 60 mL, Rfl: 3   Insulin  Pen Needle 32G X 4 MM MISC, 1 Device by Does not apply route in the  morning, at noon, in the evening, and at bedtime., Disp: 400 each, Rfl: 3   ipratropium (ATROVENT ) 0.03 % nasal spray, Place 2 sprays into both nostrils 2 (two) times daily., Disp: 30 mL, Rfl: 2   JARDIANCE  25 MG TABS tablet, Take 1 tablet (25 mg total) by mouth daily., Disp: 90 tablet, Rfl: 2   Lancets (ACCU-CHEK MULTICLIX) lancets, Check sugar 2 times daily as needed., Disp: 100 each, Rfl: 12   levocetirizine (XYZAL ) 5 MG tablet, Take 1 tablet (5 mg total) by mouth in the morning., Disp: 34 tablet, Rfl: 5   nitroGLYCERIN  (NITROSTAT ) 0.4 MG SL tablet, PLACE 1 TABLET UNDER THE TONGUE EVERY 5 MINUTES AS NEEDED FOR CHEST PAIN., Disp: 25 tablet, Rfl: 0   nortriptyline  (PAMELOR ) 10 MG capsule, Take 10 mg by mouth at bedtime., Disp: , Rfl:    prednisoLONE  acetate (PRED FORTE ) 1 %  ophthalmic suspension, Place 1 drop into the left eye 4 (four) times daily., Disp: 10 mL, Rfl: 0   Rimegepant Sulfate (NURTEC) 75 MG TBDP, Take 1 tablet (75 mg total) by mouth daily as needed. Max of every other day prn, Disp: 16 tablet, Rfl: 0   Semaglutide , 1 MG/DOSE, 4 MG/3ML SOPN, Inject into the skin., Disp: , Rfl:    spironolactone  (ALDACTONE ) 25 MG tablet, Take 1 tablet (25 mg total) by mouth daily., Disp: 90 tablet, Rfl: 3   triamcinolone  cream (KENALOG ) 0.1 %, Apply 1 Application topically 2 (two) times daily., Disp: 453.6 g, Rfl: 0   VENTOLIN  HFA 108 (90 Base) MCG/ACT inhaler, INHALE 2 PUFFS BY MOUTH EVERY 4 HOURS AS NEEDED FOR WHEEZE OR FOR SHORTNESS OF BREATH, Disp: 18 each, Rfl: 1   Vitamin D, Ergocalciferol, (DRISDOL) 1.25 MG (50000 UNIT) CAPS capsule, Take 50,000 Units by mouth once a week., Disp: , Rfl:   No Known Allergies  I personally reviewed active problem list, medication list, allergies, family history with the patient/caregiver today.   ROS  Ten systems reviewed and is negative except as mentioned in HPI    Objective Physical Exam   Vitals:   04/10/24 1043  BP: 114/66  Pulse: 89  Resp: 16  SpO2: 99%  Weight: 185 lb 8 oz (84.1 kg)  Height: 5\' 9"  (1.753 m)    Body mass index is 27.39 kg/m.  Recent Results (from the past 2160 hours)  Microalbumin / creatinine urine ratio     Status: None   Collection Time: 03/13/24 12:00 AM  Result Value Ref Range   Microalb Creat Ratio 11.7   NM Myocar Multi W/Spect W/Wall Motion / EF     Status: None (In process)   Collection Time: 03/30/24 10:18 AM  Result Value Ref Range   Base ST Depression (mm) 0 mm   Rest HR 72.0 bpm   Rest BP 125/67 mmHg   Exercise duration (min) 1 min   Exercise duration (sec) 0 sec   Estimated workload 1.0    Peak HR 84 bpm   Peak BP 125/67 mmHg   MPHR 166 bpm   Percent HR 50.0 %   Rest Nuclear Isotope Dose 10.5 mCi   Stress Nuclear Isotope Dose 34.1 mCi   SSS 4.0    SRS 5.0     SDS 0.0    TID 1.12    LV sys vol 51.0 mL   LV dias vol 118.0 46 - 106 mL   Nuc Stress EF 57 %      PHQ2/9:    04/10/2024  10:43 AM 03/09/2024   10:42 AM 12/27/2023    3:30 PM 12/10/2023    1:26 PM 11/09/2023    7:42 AM  Depression screen PHQ 2/9  Decreased Interest 0 1 0 0 0  Down, Depressed, Hopeless 0 1 0 0 0  PHQ - 2 Score 0 2 0 0 0  Altered sleeping 0 1 0 0 0  Tired, decreased energy 0 1 0 0 0  Change in appetite 0 0 0 0 0  Feeling bad or failure about yourself  0 1 0 0 0  Trouble concentrating 0 1 0 0 0  Moving slowly or fidgety/restless 0 0 0 0 0  Suicidal thoughts 0 0 0 0 0  PHQ-9 Score 0 6 0 0 0  Difficult doing work/chores Not difficult at all Very difficult Not difficult at all      phq 9 is negative  Fall Risk:    12/27/2023    3:30 PM 11/09/2023    7:42 AM 10/15/2023    2:32 PM 10/04/2023   10:24 AM 09/20/2023    9:34 AM  Fall Risk   Falls in the past year? 0 0 0 0 0  Number falls in past yr: 0 0  0 0  Injury with Fall? 0 0  0 0  Risk for fall due to : No Fall Risks No Fall Risks  No Fall Risks No Fall Risks  Follow up Falls prevention discussed;Education provided;Falls evaluation completed Falls prevention discussed  Falls prevention discussed;Education provided;Falls evaluation completed Falls prevention discussed;Education provided;Falls evaluation completed      Assessment & Plan Type 2 diabetes mellitus with complications Type 2 diabetes with complications including peripheral neuropathy and diabetic retinopathy. A1c previously 10.6, now improved glucose level at 177. C-peptide 4.3 indicates insulin  production. GAD65 antibodies normal. Frequent hunger suggests suboptimal control. Considering insulin  pump for better management. - Continue Ozempic  1 mg, Jardiance  25 mg, Basaglar  70 units, and Novolog  before meals. - Monitor blood glucose levels regularly. - Follow up with endocrinologist on May 14-15.  Coronary artery disease with  cardiomyopathy Coronary artery disease with cardiomyopathy. Surgery postponed due to uncontrolled diabetes and back issues. No chest pain, occasional throat tightness and indigestion. No congestive heart failure symptoms. Emphasized better glucose control for potential surgery. - Focus on achieving better glucose control for potential coronary artery bypass surgery. - Monitor for symptoms of congestive heart failure.  Peripheral neuropathy Peripheral neuropathy with symptoms of weakness and imbalance. Under neurologist and orthopedic care. - Continue follow-up with neurologist and orthopedic specialist. Taking gabapentin 400 mg , rx for 600 mg waiting for her at pharmacy - Await results of recent MRIs of cervical and thoracic spine.  Chronic bilateral low back pain with spinal stenosis Chronic low back pain with spinal stenosis and degenerative changes. Symptoms worsening, uses cane for stability. Under orthopedic evaluation. Discussed surgical limitations for multilevel arthritis. - Continue follow-up with orthopedic specialist. - Await results of recent MRIs for further management.  Hip weakness and imbalance Hip weakness and imbalance, primarily right side. Uses quad cane for support. Under orthopedic evaluation. Advised to use quad cane on right side. - Continue follow-up with orthopedic specialist. - Use quad cane on the right side for support.  Chronic anemia Chronic anemia noted in lab results.

## 2024-04-11 ENCOUNTER — Encounter

## 2024-04-13 ENCOUNTER — Telehealth: Payer: Self-pay

## 2024-04-13 ENCOUNTER — Encounter

## 2024-04-13 ENCOUNTER — Telehealth: Payer: Self-pay | Admitting: Neurology

## 2024-04-13 ENCOUNTER — Encounter: Payer: Self-pay | Admitting: Cardiology

## 2024-04-13 ENCOUNTER — Ambulatory Visit: Attending: Cardiology | Admitting: Cardiology

## 2024-04-13 VITALS — BP 129/77 | HR 74 | Ht 69.0 in | Wt 186.6 lb

## 2024-04-13 DIAGNOSIS — M545 Low back pain, unspecified: Secondary | ICD-10-CM | POA: Diagnosis not present

## 2024-04-13 DIAGNOSIS — R269 Unspecified abnormalities of gait and mobility: Secondary | ICD-10-CM | POA: Diagnosis not present

## 2024-04-13 DIAGNOSIS — G8929 Other chronic pain: Secondary | ICD-10-CM

## 2024-04-13 DIAGNOSIS — E785 Hyperlipidemia, unspecified: Secondary | ICD-10-CM

## 2024-04-13 DIAGNOSIS — E1165 Type 2 diabetes mellitus with hyperglycemia: Secondary | ICD-10-CM

## 2024-04-13 DIAGNOSIS — I340 Nonrheumatic mitral (valve) insufficiency: Secondary | ICD-10-CM | POA: Diagnosis not present

## 2024-04-13 DIAGNOSIS — N183 Chronic kidney disease, stage 3 unspecified: Secondary | ICD-10-CM

## 2024-04-13 DIAGNOSIS — Z794 Long term (current) use of insulin: Secondary | ICD-10-CM | POA: Diagnosis not present

## 2024-04-13 DIAGNOSIS — I5032 Chronic diastolic (congestive) heart failure: Secondary | ICD-10-CM

## 2024-04-13 DIAGNOSIS — I10 Essential (primary) hypertension: Secondary | ICD-10-CM | POA: Diagnosis not present

## 2024-04-13 DIAGNOSIS — I255 Ischemic cardiomyopathy: Secondary | ICD-10-CM | POA: Diagnosis not present

## 2024-04-13 DIAGNOSIS — M5416 Radiculopathy, lumbar region: Secondary | ICD-10-CM | POA: Diagnosis not present

## 2024-04-13 DIAGNOSIS — I2510 Atherosclerotic heart disease of native coronary artery without angina pectoris: Secondary | ICD-10-CM

## 2024-04-13 DIAGNOSIS — R29898 Other symptoms and signs involving the musculoskeletal system: Secondary | ICD-10-CM | POA: Diagnosis not present

## 2024-04-13 DIAGNOSIS — M48061 Spinal stenosis, lumbar region without neurogenic claudication: Secondary | ICD-10-CM | POA: Diagnosis not present

## 2024-04-13 MED ORDER — ENTRESTO 24-26 MG PO TABS: 1.0000 | ORAL_TABLET | Freq: Two times a day (BID) | ORAL | 3 refills | Status: AC

## 2024-04-13 NOTE — Telephone Encounter (Signed)
 MY[facility] conf

## 2024-04-13 NOTE — Patient Instructions (Signed)
 Medication Instructions:  Your Physician recommend you continue on your current medication as directed.    *If you need a refill on your cardiac medications before your next appointment, please call your pharmacy*  Lab Work: No labs ordered today  If you have labs (blood work) drawn today and your tests are completely normal, you will receive your results only by: MyChart Message (if you have MyChart) OR A paper copy in the mail If you have any lab test that is abnormal or we need to change your treatment, we will call you to review the results.  Testing/Procedures: No test ordered today   Follow-Up: At [facility], you and your health needs are our priority.  As part of our continuing mission to provide you with exceptional heart care, our providers are all part of one team.  This team includes your primary Cardiologist (physician) and Advanced Practice Providers or APPs (Physician Assistants and Nurse Practitioners) who all work together to provide you with the care you need, when you need it.  Your next appointment:   6 month(s)  Provider:   Constancia Delton, MD or Ronald Cockayne, NP

## 2024-04-13 NOTE — Telephone Encounter (Signed)
 Her MRI's have resulted now.  FYI, A1c in January was 10.6. Sarah Duffy mentioned she just recently got a dexcom, so hopefully her diabetes is under better control than it was in January.

## 2024-04-13 NOTE — Telephone Encounter (Signed)
 I received a call from Adelle Hong, NP at [facility]. She saw this patient today and her symptoms have worsened. Per [place], she is in a wheelchair and her gait is like she is drunk. I have called the reading room to request her MRI reports be expedited.

## 2024-04-13 NOTE — Progress Notes (Signed)
 Cardiology Office Note:  .   Date:  04/13/2024  ID:  [name], DOB 07-Jun-1971, MRN [id number] PCP: [name], Krichna, MD  [facility] Providers Cardiologist:  Constancia Delton, MD    History of Present Illness: .   Sarah Duffy is a 53 y.o. female with a past medical history of coronary artery disease, chronic heart failure with improved EF, ischemic cardiomyopathy, hypertension, hyperlipidemia, type 2 diabetes, CKD stage III, MRSA/osteomyelitis of the foot (2017), who is here today to follow-up on her HFimpEF.   She previously been evaluated in March with transient chest discomfort concerning the following death of her mother.  Echocardiogram at that time revealed an LVEF of 35-40% with global hypokinesis, G1 DD.  Diagnostic catheterization was performed and showed severe multivessel CAD.  She was referred to CT surgery in September 2022 with recommendation for CABG.  It was initially scheduled for October 2022 but was subsequently canceled secondary to the start of an illness.  Echocardiogram in July 2023 showed an EF of 50-55%, G2 DD, mild MR.  She was referred back to CT surgery in August 2023 and deferred proceeding with CABG due to change in her medical insurance.  She was seen in clinic 03/03/2023 at that time she states she was doing well.  Amlodipine  was discontinued with plan to titrate Entresto  in the future.  She had recently been diagnosed with flu and had recovered.  She had not made a follow-up appointment with CT surgery because of ongoing issues with insurance.  There were no medication changes that were made and she was scheduled for labs and encouraged to make her follow-up with [facility].  She did follow-up with Dr. Ames Bakes on 08/25/2023.  At that time she had overall continued to feel well.  She continued denying chest pain or pressure.  She recently followed up with her PCP and was noted to have an elevated glucose and a hemoglobin A1c was elevated 8.4.  She was scheduled to  see her endocrinologist in the near future and advised to have that taken care of.  She was to follow back up with [facility] for coronary artery bypass grafting after she had seen her endocrinologist and her dentist about a tooth.   She was last seen in clinic 11/01/2023 overall she was doing well from a cardiac perspective.  She had several CT scans and was going through physical therapy and was awaiting MRI of her back.  She had followed up with Dr. Sherene Dilling who advised her to have some issues taken care of prior to undergoing open heart surgery.  She was continued on her current medication regimen without changes and discussed repeating her echocardiogram on return.  She followed up with [facility] cardiology to establish care on 03/01/2024.  She had previously been diagnosed with HFrEF in 2022 and coronary artery disease.  She continued not to have any overt angina or heart failure symptoms.  She been started on goal-directed medical therapy send most recent LV function was 50-55%.  LDL was found to be 72 with goal of less than 70 and she was recommended to start on ezetimibe  10.  They discontinued her amlodipine  due to some lightheadedness and dizziness while at work.  She was recommended to check her blood pressure daily and she was scheduled for an echocardiogram.  She was then scheduled a 82-month return appointment.  She presented to [facility] cardiology with Dr. Renold Cashing on/9/25 as a new patient to establish care.  She was scheduled for an echocardiogram for  reassessment of her LVEF.  Continued on amlodipine  and Entresto  as well as Jardiance , carvedilol , furosemide .  Continued on her lipid management.  She was scheduled for an exercise Myoview  for further evaluation of ischemia.  She was referred to neurosurgery for potentially the need for an MRI for her lower back pain.  She was to return for follow-up in 1 month.  She returns to clinic today stating that from the cardiac perspective she has been doing well.  She  continues to battle with back issues.  She stated that she had continued to be evaluated by physiatry.  Recently had new MRIs completed and is waiting for next steps in treatment for her back as they were worried about spinal stenosis and variation of things.  She stated that she had followed up with a neurologist because there was concern that she had had a stroke.  CT of the head was negative.  She is continuing to wait to hear back from neurosurgery and various providers for her back issues.  She did stop at a thrift store and pick up a cane to help with her balance as she feels as though that her right leg continues to give out on her and she is losing control of and is unable to pick it up and move it.  She was able to ambulate with a cane today but with difficulty.  She states that she continues to take all of her medications without any undue side effects.  She denies any hospitalizations or visits to the emergency department.  ROS: 10 point review of systems has been reviewed and considered negative exception was plus in the HPI  Studies Reviewed: .       Lexiscan  MPI ([facility]) results pending  2D echo ([facility]) 04/06/2024 CONCLUSION -------------------------------------------------------------------------------  MILD LEFT VENTRICULAR SYSTOLIC DYSFUNCTION WITH NO LVH  ESTIMATED EF: 50%, CALC EF(2D): 49%  NORMAL LA PRESSURES WITH DIASTOLIC DYSFUNCTION (GRADE 1)  NORMAL RIGHT VENTRICULAR SYSTOLIC FUNCTION  VALVULAR REGURGITATION: No AR, MILD MR, MILD PR, MILD TR  NO VALVULAR STENOSIS   2D echo [facility]) 03/01/2024 Summary   1. The left ventricle is normal in size with normal wall thickness.    2. The left ventricular systolic function is overall normal, LVEF is  visually estimated at 55-60%.    3. There is decreased contractile function involving the mid inferolateral  segment(s).   4. The left atrium is mildly dilated in size.    5. The right ventricle is normal in size, with normal systolic  function.   TTE 06/23/22 1. Left ventricular ejection fraction, by estimation, is 50 to 55%. Left  ventricular ejection fraction by 3D volume is 52 %. Left ventricular  ejection fraction by PLAX is 53 %. The left ventricle has low normal  function. The left ventricle has no  regional wall motion abnormalities. Left ventricular diastolic parameters  are consistent with Grade II diastolic dysfunction (pseudonormalization).  The average left ventricular global longitudinal strain is -18.9 %.   2. Right ventricular systolic function is normal. The right ventricular  size is normal. There is normal pulmonary artery systolic pressure. The  estimated right ventricular systolic pressure is 21.0 mmHg.   3. Left atrial size was mildly dilated.   4. The mitral valve is normal in structure. Mild mitral valve  regurgitation. No evidence of mitral stenosis.   5. The aortic valve is normal in structure. Aortic valve regurgitation is  not visualized. Aortic valve sclerosis is present, with no evidence of  aortic valve stenosis.   6. The inferior vena cava is normal in size with greater than 50%  respiratory variability, suggesting right atrial pressure of 3 mmHg.    R/LHC 04/14/21 Conclusions: Multivessel coronary artery disease including mild to moderate diffuse plaquing of the LAD and RCA.  The mid/distal LAD demonstrates 3 focal lesions of up to 80% that are hemodynamically significant by iFR (iFR = 0.71).  There is also a 99% stenosis of OM2 with faint left to left collaterals supplying the distal branch as well as sequential 60% distal RCA and 70-80% proximal RPDA lesions. Upper normal to mildly elevated left heart filling pressure. Normal right heart and pulmonary artery pressures. Normal cardiac output/index.   Recommendations: In the setting of multivessel coronary artery disease, diabetes mellitus, and reduced LVEF that appears to be due to ischemic cardiomyopathy, consultation with cardiac  surgery for consideration of CABG is recommended. Initiate aspirin  81 mg daily. Increase carvedilol  to 12.5 mg twice daily with further escalation of goal-directed medical therapy for chronic HFrEF as tolerated. Aggressive secondary prevention of coronary artery disease.   TTE 03/12/21 1. Left ventricular ejection fraction, by estimation, is 35 to 40%. The  left ventricle has moderately decreased function. The left ventricle  demonstrates global hypokinesis. Left ventricular diastolic parameters are  consistent with Grade I diastolic  dysfunction (impaired relaxation). The average left ventricular global  longitudinal strain is -13.5 %. The global longitudinal strain is  abnormal.   2. Right ventricular systolic function is normal. The right ventricular  size is normal.   3. Left atrial size was mildly dilated.   4. The mitral valve is normal in structure. Moderate mitral valve  regurgitation.  Risk Assessment/Calculations:             Physical Exam:   VS:  BP 129/77   Pulse 74   Ht 5\' 9"  (1.753 m)   Wt 186 lb 9.6 oz (84.6 kg)   LMP  (LMP Unknown)   SpO2 99%   BMI 27.56 kg/m    Wt Readings from Last 3 Encounters:  04/13/24 186 lb 9.6 oz (84.6 kg)  04/10/24 185 lb 8 oz (84.1 kg)  04/03/24 184 lb (83.5 kg)    GEN: Well nourished, well developed in no acute distress NECK: No JVD; No carotid bruits CARDIAC: RRR, II/VI systolic murmur RUSB without rubs or gallops RESPIRATORY:  Clear to auscultation without rales, wheezing or rhonchi  ABDOMEN: Soft, non-tender, non-distended EXTREMITIES:  No edema; No deformity   ASSESSMENT AND PLAN: .   Coronary artery disease status post diagnostic catheterization in May 2022 with severe diffuse multivessel coronary artery disease.  Previously seen by CT surgery with recommendation for CABG.  She followed up with Dr. Ames Bakes who recommended with her ongoing issues with her back because she had that evaluated prior to surgery.  She continues to  be without anginal or anginal equivalents.  She recently had 2 echocardiograms completed 1 at [facility] and 1 at [facility] and Myoview  stress testing completed with pending results ordered by [facility] cardiology at the beginning of April.  She continues to remain on aspirin  81 mg daily, atorvastatin  80 mg daily and ezetimibe  10 mg daily.  No further ischemic testing is needed at this time.  HFimpEF/ischemic cardiomyopathy with echocardiogram normalized, completed in 04/02/2024 with an LVEF of 50%, G1 DD, mild PR TR and MR without valvular stenosis noted completed at [facility] cardiology.  She continues to remain euvolemic on exam.  Continued on GDMT of spironolactone   25 mg daily, Entresto  24/26 mg twice daily which she is requesting a refill of today, Jardiance  25 mg daily, carvedilol  12.5 mg twice daily.  No further medication changes were made today.  Primary hypertension with a blood pressure today 129/77.  Blood pressures remain stable.  She is continued on spironolactone , Entresto , carvedilol , and amlodipine  5 mg daily.  She has been encouraged to continue to monitor pressure 1 to 2 hours postmedication administration as well.  Mixed hyperlipidemia with last LDL of 72.  She is continued on atorvastatin  80 and ezetimibe  10 mg daily.  Deferred initiation of PCSK9 inhibitor therapy today due to ongoing issues with her back.  Mitral regurgitation with heart murmur on exam.  Noted mild MR on echocardiogram recently completed.  Continue with surveillance studies.  CKD stage IIIa with last serum creatinine of 1.  She is encouraged to avoid NSAIDs and nephrotoxic agents were able.  Type 2 diabetes which she states her sugars have been poorly controlled.  Ongoing management by her endocrinologist.  Ongoing back pain that is limiting ambulation and ADLs.  She continues to follow-up with neurosurgery and orthopedics.  Recently had MRIs completed awaiting results.       Dispo: Patient return to clinic to see MD/APP in 6 months or  sooner for reevaluation of symptoms if needed.  Signed, Maurina Fawaz, NP

## 2024-04-14 ENCOUNTER — Encounter: Payer: Self-pay | Admitting: Neurosurgery

## 2024-04-14 NOTE — Telephone Encounter (Signed)
 She called back and is scheduled for Monday.

## 2024-04-14 NOTE — Telephone Encounter (Addendum)
 Left message to return call to schedule appointment for Monday. I also sent her a Clinical cytogeneticist message.

## 2024-04-17 ENCOUNTER — Encounter: Payer: Self-pay | Admitting: Neurology

## 2024-04-17 ENCOUNTER — Ambulatory Visit: Admitting: Neurosurgery

## 2024-04-17 ENCOUNTER — Ambulatory Visit: Payer: Medicaid Other | Admitting: Neurology

## 2024-04-17 ENCOUNTER — Encounter: Payer: Self-pay | Admitting: Neurosurgery

## 2024-04-17 VITALS — BP 138/70 | Ht 69.0 in | Wt 187.0 lb

## 2024-04-17 VITALS — BP 111/65 | HR 81 | Ht 69.0 in | Wt 187.5 lb

## 2024-04-17 DIAGNOSIS — M5124 Other intervertebral disc displacement, thoracic region: Secondary | ICD-10-CM

## 2024-04-17 DIAGNOSIS — G43709 Chronic migraine without aura, not intractable, without status migrainosus: Secondary | ICD-10-CM

## 2024-04-17 DIAGNOSIS — M40292 Other kyphosis, cervical region: Secondary | ICD-10-CM

## 2024-04-17 DIAGNOSIS — M4712 Other spondylosis with myelopathy, cervical region: Secondary | ICD-10-CM | POA: Diagnosis not present

## 2024-04-17 MED ORDER — BUTALBITAL-APAP-CAFFEINE 50-325-40 MG PO TABS: 1.0000 | ORAL_TABLET | Freq: Four times a day (QID) | ORAL | 5 refills | Status: DC | PRN

## 2024-04-17 NOTE — Progress Notes (Unsigned)
 Referring Physician:  No referring provider defined for this encounter.  Primary Physician:  [name], Krichna, MD  History of Present Illness: 04/17/2024 Sarah Duffy is here today to follow-up, she is per senting with progressive bilateral hip weakness and difficulty with ambulation as well as shoulder and hand weakness.  She is showing up for concern for progressive myelopathy.  She has had her imaging and is here today to review.  She does continue to have mild neck and back pain.  Most of her issues are with her ambulatory factors.  The symptoms are causing a significant impact on the patient's life.   Review of Systems:  A 10 point review of systems is negative, except for the pertinent positives and negatives detailed in the HPI.  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 02/2021 Echo: EF 35-40%, glob HK, GrI DD, nl RV fxn, mildly dil LA, mod MR; b. 06/2022 Echo: EF 50-55%, no rwma, GrII DD, nl RV fxn, RVSP , mildly dil LA, mild MR, AoV sclerosis.   CKD (chronic kidney disease), stage III (HCC)    Complication of anesthesia 03/23/2023   Possible gastroparesis based off food in stomach during EGD   Coronary artery disease    a. 04/2021 Cath: LM nl, LAD 70p/m, 5m, 80d, LCX 44m, OM1 40, OM2 99 (fills via collats from OM1), RCA 60d, RPDA 75.   COVID-19 2021   Hyperlipidemia LDL goal <70    Hypertension    Ischemic cardiomyopathy    a. 02/2021 Echo: EF 35-40%, glob HK, GrI DD; b. 06/2022 Echo: EF 50-55%.   Moderate mitral regurgitation    a. 02/2021 Echo: Mod MR.   MRSA infection within last 3 months 02/25/2016   Osteomyelitis of foot (HCC) 08/26/2016   Type II diabetes mellitus (HCC)     Past Surgical History: Past Surgical History:  Procedure Laterality Date   CHOLECYSTECTOMY  1999   COLONOSCOPY WITH PROPOFOL  N/A 03/25/2021   Procedure: COLONOSCOPY WITH PROPOFOL ;  Surgeon: Luke Salaam, MD;  Location: [facility] ENDOSCOPY;  Service: Gastroenterology;  Laterality: N/A;   COLONOSCOPY WITH PROPOFOL  N/A 04/17/2021   Procedure: COLONOSCOPY WITH PROPOFOL ;  Surgeon: Luke Salaam, MD;  Location: [facility] ENDOSCOPY;  Service: Gastroenterology;  Laterality: N/A;   ESOPHAGOGASTRODUODENOSCOPY (EGD) WITH PROPOFOL  N/A 03/25/2021   Procedure: ESOPHAGOGASTRODUODENOSCOPY (EGD) WITH PROPOFOL ;  Surgeon: Luke Salaam, MD;  Location: [facility] ENDOSCOPY;  Service: Gastroenterology;  Laterality: N/A;   ESOPHAGOGASTRODUODENOSCOPY (EGD) WITH PROPOFOL  N/A 04/17/2021   Procedure: ESOPHAGOGASTRODUODENOSCOPY (EGD) WITH PROPOFOL ;  Surgeon: Luke Salaam, MD;  Location: [facility] ENDOSCOPY;  Service: Gastroenterology;  Laterality: N/A;   ESOPHAGOGASTRODUODENOSCOPY (EGD) WITH PROPOFOL  N/A 03/23/2023   Procedure: ESOPHAGOGASTRODUODENOSCOPY (EGD) WITH PROPOFOL ;  Surgeon: Luke Salaam, MD;  Location: [facility] ENDOSCOPY;  Service: Gastroenterology;  Laterality: N/A;   RIGHT/LEFT HEART CATH AND CORONARY ANGIOGRAPHY Bilateral 04/14/2021   Procedure: RIGHT/LEFT HEART CATH AND CORONARY ANGIOGRAPHY;  Surgeon: Sammy Crisp, MD;  Location: [facility] INVASIVE CV LAB;  Service: Cardiovascular;  Laterality: Bilateral;   TOE SURGERY Left 02/07/2016   Pinky Toe    Allergies: Allergies as of 04/17/2024   (No Known Allergies)    Medications: Outpatient Encounter Medications as of 04/17/2024  Medication Sig   amLODipine  (NORVASC ) 5 MG tablet Take 1 tablet (5 mg total) by mouth daily.   aspirin  EC 81 MG tablet Take by mouth.   atorvastatin  (LIPITOR) 80 MG tablet Take 1 tablet (80 mg  total) by mouth daily.   Blood Glucose Monitoring Suppl (ACCU-CHEK GUIDE) w/Device KIT Use to check blood sugar 2 time daily as needed   carvedilol  (COREG ) 12.5 MG tablet TAKE 1 TABLET BY MOUTH 2 TIMES DAILY.   Continuous Glucose Sensor (DEXCOM G7 SENSOR) MISC 1 DEVICE BY DOES NOT APPLY ROUTE AS DIRECTED.   ENTRESTO  24-26 MG Take 1 tablet by mouth 2 (two) times daily.    ezetimibe  (ZETIA ) 10 MG tablet Take 1 tablet (10 mg total) by mouth daily.   gabapentin (NEURONTIN) 600 MG tablet Take 600 mg by mouth 3 (three) times daily.   Galcanezumab -gnlm (EMGALITY ) 120 MG/ML SOSY Inject into the skin.   glucose blood (ACCU-CHEK GUIDE) test strip Check blood sugar 2 times daily as needed   insulin  aspart (NOVOLOG  FLEXPEN) 100 UNIT/ML FlexPen Max daily 30 units   Insulin  Glargine (BASAGLAR  KWIKPEN) 100 UNIT/ML Inject 70 Units into the skin daily.   Insulin  Pen Needle 32G X 4 MM MISC 1 Device by Does not apply route in the morning, at noon, in the evening, and at bedtime.   ipratropium (ATROVENT ) 0.03 % nasal spray Place 2 sprays into both nostrils 2 (two) times daily.   JARDIANCE  25 MG TABS tablet Take 1 tablet (25 mg total) by mouth daily.   Lancets (ACCU-CHEK MULTICLIX) lancets Check sugar 2 times daily as needed.   levocetirizine (XYZAL ) 5 MG tablet Take 1 tablet (5 mg total) by mouth in the morning.   nitroGLYCERIN  (NITROSTAT ) 0.4 MG SL tablet PLACE 1 TABLET UNDER THE TONGUE EVERY 5 MINUTES AS NEEDED FOR CHEST PAIN.   prednisoLONE  acetate (PRED FORTE ) 1 % ophthalmic suspension Place 1 drop into the left eye 4 (four) times daily.   Rimegepant Sulfate (NURTEC) 75 MG TBDP Take 1 tablet (75 mg total) by mouth daily as needed. Max of every other day prn   Semaglutide , 1 MG/DOSE, 4 MG/3ML SOPN Inject into the skin.   spironolactone  (ALDACTONE ) 25 MG tablet Take 1 tablet (25 mg total) by mouth daily.   Vitamin D, Ergocalciferol, (DRISDOL) 1.25 MG (50000 UNIT) CAPS capsule Take 50,000 Units by mouth once a week.   [DISCONTINUED] VENTOLIN  HFA 108 (90 Base) MCG/ACT inhaler INHALE 2 PUFFS BY MOUTH EVERY 4 HOURS AS NEEDED FOR WHEEZE OR FOR SHORTNESS OF BREATH   [DISCONTINUED] butalbital -acetaminophen -caffeine  (FIORICET) 50-325-40 MG tablet Take 1 tablet by mouth every 6 (six) hours as needed for headache. (Patient not taking: Reported on 04/17/2024)   No facility-administered  encounter medications on file as of 04/17/2024.    Social History: Social History   Tobacco Use   Smoking status: Never   Smokeless tobacco: Never  Vaping Use   Vaping status: Never Used  Substance Use Topics   Alcohol use: No    Alcohol/week: 0.0 standard drinks of alcohol   Drug use: No    Family Medical History: Family History  Problem Relation Age of Onset   Diabetes Mother    Ulcers Mother    Heart disease Father    AAA (abdominal aortic aneurysm) Father    Diabetes Father    Hypertension Father    Stroke Father    Alzheimer's disease Father    Heart attack Sister    Seizures Brother    Diabetes Maternal Grandmother    Breast cancer Maternal Grandmother     Physical Examination:   General: Patient is well developed, well nourished, calm, collected, and in no apparent distress. Attention to examination is appropriate.  Psychiatric: Patient is  non-anxious.  ENT:  Oral mucosa appears well hydrated.  Neck:   Supple.  Full range of motion.  Respiratory: Patient is breathing without any difficulty.  Extremities: No edema.  Vascular: Palpable dorsal pedal pulses.  Skin:   On exposed skin, there are no abnormal skin lesions.  NEUROLOGICAL:     Awake, alert, oriented to person, place, and time.  Speech is clear and fluent. Fund of knowledge is appropriate.   Cranial Nerves: Pupils equal round and reactive to light.    Patient is tender to palpation of her lumbar paraspinals.  Strength: Also has weakness in her deltoids approximately 4- out of 5 and intrinsic hand weakness 4 out of 5.  Side Iliopsoas Quads Hamstring PF DF EHL  R 2 5 5 5 4 5   L 2 5 5 5 4 5     Has developed some hyperreflexia at the bilateral brachial radialis and has presence of a right sided Tromner reflex.  Clonus is not present.    Medical Decision Making  Imaging: MRI Lumbar Spine: IMPRESSION: 1. Multilevel degenerative changes of the lumbar spine as described above. Severe  spinal canal and bilateral lateral recess stenosis at L4-L5. 2. Moderate to severe spinal canal and bilateral lateral recess stenosis at L3-L4 and L5-S1.  I have personally reviewed the images and agree with the above interpretation.  Narrative & Impression  CLINICAL DATA:  Ascending weakness. Possible myelopathy. Weakness in both legs since 2024. Gait instability.   EXAM: MRI CERVICAL SPINE WITHOUT CONTRAST   TECHNIQUE: Multiplanar, multisequence MR imaging of the cervical spine was performed. No intravenous contrast was administered.   COMPARISON:  Thoracic and lumbar spine scoliosis series radiographs 11/22/2023   FINDINGS: Alignment: There is minimal, 1 mm grade 1 anterolisthesis of C4 on C5. 1-2 mm retrolisthesis of C5 on C6 and C6 on C7. Mild kyphotic angulation centered at C5.   Vertebrae: Vertebral body heights are maintained. Moderate to severe anterior and right C5-6 and diffuse C6-7 disc space narrowing. Mild anterior superior right C6-7 edematous marrow endplate degenerative changes. Large anterior C5-6 and C6-7 endplate osteophytes. The atlantodens interval is intact.   Cord: There is mild increased T2 signal within the right and left aspects of the cord at the C5-6 disc level (axial series 4, images 18 and 19) and within the left aspect of the cord just inferior to this (axial images 20 and 21). This is at and below region of C5-6 central canal stenosis described below. This increased T2 signal may represent chronic myelomalacia. Acute cord edema felt less likely.   Posterior Fossa, vertebral arteries, paraspinal tissues: The visualized posterior fossa is unremarkable. The vertebral artery flow voids are maintained. The visualized paraspinal soft tissues are otherwise unremarkable.   Disc levels:   C2-3: Moderate left and moderate right facet joint hypertrophy. No posterior disc bulge. No central canal or neuroforaminal stenosis.   C3-4: Moderate  bilateral facet joint hypertrophy. Note is made that the right vertebral artery extends deep into the right neural foramen (axial series 4, images 7 through 9). The left vertebral artery extends more moderately into the left neural foramen (axial series 4 images 5 through 7). Care should be taken during any neuroforaminal intervention at this level. Minimal midline posterior disc protrusion. No central canal or neuroforaminal stenosis.   C4-5: Moderate to severe right and moderate left facet joint hypertrophy. Moderate right intraforaminal disc and endplate spurring with moderate to severe right neuroforaminal stenosis. Mild right lateral recess narrowing. The left neural  foramina is patent. No central canal stenosis.   C5-6: Mild kyphotic angulation centered at C5-6. Moderate broad-based posterior disc osteophyte complex with mild midline posterior disc protrusion mildly to moderately impresses on the midline ventral cord. CSF is focally effaced posterior the cord. Moderate to high-grade bilateral intraforaminal disc and endplate spurring. Moderate to severe right greater than left neuroforaminal stenosis. Moderate central canal stenosis. The AP dimension of the central canal is narrowed down to 6 mm.   C6-7: Mild-to-moderate bilateral facet joint hypertrophy. Moderate broad-based posterior disc osteophyte complex with moderate to high-grade left and moderate right intraforaminal extension. Severe left and moderate to severe right neuroforaminal stenosis. Moderate left and mild right lateral recess narrowing. Mild to moderate central canal stenosis.   C7-T1: Mild to moderate bilateral facet joint hypertrophy. No posterior disc bulge. No central canal or neuroforaminal stenosis.   Please see report from contemporaneous MRI of the thoracic spine for evaluation of the upper thoracic spine levels.   IMPRESSION: 1. Moderate C5-6 central canal stenosis with posterior disc osteophyte  complex mildly to mildly impressing on the midline ventral cord. Mild increased T2 signal within the right and left aspects of the cord at the C5-6 disc level and within the left aspect of the cord just inferior to this. This is favored to represent chronic myelomalacia. Acute cord edema is felt less likely. 2. Additional multilevel degenerative disc and joint changes as above. 3. C5-6 moderate to severe right greater than left neuroforaminal stenosis. 4. C6-7 mild to moderate central canal stenosis. Severe left and moderate to severe right neuroforaminal stenosis. Moderate left and mild right lateral recess narrowing. 5. C4-5 moderate to severe right neuroforaminal stenosis. 6. Note is made that the right vertebral artery extends deep into the right C3-4 neural foramen. The left vertebral artery extends more moderately into the left C3-4 neural foramen. Although these are normal variants, care should be taken during any neuroforaminal intervention at this level.     Electronically Signed   By: Bertina Broccoli M.D.   On: 04/13/2024 13:01     She has a mixture of disc herniation with ventral compression as well as a slight cervical kyphosis at this level causing a increased degree of compression, there is also T2 signal change throughout likely representing myelomalacia and indicated in her progressive cervical myelopathy.  Assessment and Plan: Sarah Duffy is a pleasant 53 y.o. female with chief complaint of bilateral hip weakness and ambulation difficulty.  When we first saw her we were concerned for possible myelopathy so had her obtain cervical and thoracic imaging, thoracic imaging was relatively clean, however cervical imaging demonstrated a local kyphosis with a disc herniation and T2 signal change and myelomalacia in the cervical cord.  She does continue to have worsening ambulation issues.  Sometimes on longer commutes she will request a chair and this is a major change from even  just a year ago.  She does feel like her legs are getting progressively worse.  She has very mild upper extremity and neck symptoms but has not lost any dexterity that she recognizes.  On physical examination she has some hyperreflexia now at the bilateral brachioradialis with a Tromner reflex on the right.  Her weakness pattern includes her deltoids and her grip/intrinsics as well as her hips and dorsiflexors, this is consistent with the presentation of cervical myelopathy.  Given the worsening ambulatory issues I do feel like she would benefit from cervical decompression and fusion.  I reached out to her primary  care provider to discuss the safety of this procedure going forward in the setting of her diabetes.  We generally like to have 6 weeks of good sugar control and then will obtain a fructosamine test to decide whether or not we will go forward with a decompression.  Her ambulation does appear to be progressively worsening and this may force us  to move forward earlier in the setting of her progressive disability.   Thank you for involving me in the care of this patient.   [facility] Dept. of Neurosurgery

## 2024-04-17 NOTE — Progress Notes (Signed)
 Chief Complaint  Patient presents with   New Patient (Initial Visit)    Rm15, alone, NP internal referral for Migraines:1/30 days, ha's: 2/30 days, triggers: unidentifiable       ASSESSMENT AND PLAN  Sarah Duffy is a 53 y.o. female   Chronic migraine Cerebral small vessel disease  Multiple vascular risk factor, hypertension, hyperlipidemia, diabetes that was under suboptimal control, most recent A1c was 10.6,  Her migraines overall under good control, taking Emgality  as preventive medication, Nurtec works okay, we will also give her Fioricet as needed,  Continue follow-up with primary care physician, optimize her diabetes control, only return to clinic for new issues  DIAGNOSTIC DATA (LABS, IMAGING, TESTING) - I reviewed patient records, labs, notes, testing and imaging myself where available.   MEDICAL HISTORY:  Sarah Duffy is a 53 year old female, seen in request by her primary care from [address]  Ava Lei, [place], for evaluation of chronic migraine, initial evaluation Apr 17, 2024    History is obtained from the patient and review of electronic medical records. I personally reviewed pertinent available imaging films in PACS.   PMHx of  Chronic migraine. DM HTN HLD CAD DM peripheral neuropathy  She has a history of chronic migraines since high school, tends to be on the left side, severe pounding headache with light, noise sensitivity, lasting for hours or longer  She works at [employer] for many years, sometimes she has to leave work due to her migraines, once or twice each month  She was started on Emgality  since 2024 as preventive medication, reported 75% improvement, only suffered migraine once over past 30 days, Nurtec as needed, lying down in dark quiet room taking a nap usually help, her migraine last for few hours  Now she suffered significant low back pain, ambulate with cane, is under pain management  Many years history of bilateral  feet paresthesia, poorly controlled diabetes, A1c in February 2025 was 10.6, 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  PHYSICAL EXAMNIATION:  Gen: NAD, conversant, well nourised, well groomed                     Cardiovascular: Regular rate rhythm, no peripheral edema, warm, nontender. Eyes: Conjunctivae clear without exudates or hemorrhage Neck: Supple, no carotid bruits. Pulmonary: Clear to auscultation bilaterally   NEUROLOGICAL EXAM:  MENTAL STATUS: Speech/cognition: Awake, alert, oriented to history taking and casual conversation CRANIAL NERVES: CN II: Visual fields are full to confrontation. Pupils are round equal and briskly reactive to light. CN III, IV, VI: extraocular movement are normal. No ptosis. CN V: Facial sensation is intact to light touch CN VII: Face is symmetric with normal eye closure  CN VIII: Hearing is normal to causal conversation. CN IX, X: Phonation is normal. CN XI: Head turning and shoulder shrug are intact  MOTOR: There is no pronator drift of out-stretched arms. Muscle bulk and tone are normal. Muscle strength is normal.  REFLEXES: Hypoactive symmetric  SENSORY: Length-dependent sensory changes COORDINATION: There is no trunk or limb dysmetria noted.  GAIT/STANCE: Push-up to get up from seated position, antalgic  REVIEW OF SYSTEMS:  Full 14 system review of systems performed and notable only for as above All other review of systems  were negative.   ALLERGIES: No Known Allergies  HOME MEDICATIONS: Current Outpatient Medications  Medication Sig Dispense Refill   amLODipine  (NORVASC ) 5 MG tablet Take 1 tablet (5 mg total) by mouth daily. 30 tablet 5   aspirin  EC 81 MG tablet Take by mouth.     atorvastatin  (LIPITOR) 80 MG tablet Take 1 tablet (80 mg total) by mouth daily. 30 tablet 5   Blood Glucose Monitoring Suppl (ACCU-CHEK GUIDE) w/Device KIT Use to check blood sugar 2 time daily as needed 1 kit 0   carvedilol  (COREG ) 12.5 MG tablet TAKE 1 TABLET BY MOUTH 2 TIMES DAILY. 180 tablet 2   Continuous Glucose Sensor (DEXCOM G7 SENSOR) MISC 1 DEVICE BY DOES NOT APPLY ROUTE AS DIRECTED. 3 each 11   ENTRESTO  24-26 MG Take 1 tablet by mouth 2 (two) times daily. 180 tablet 3   ezetimibe  (ZETIA ) 10 MG tablet Take 1 tablet (10 mg total) by mouth daily. 30 tablet 5   gabapentin (NEURONTIN) 600 MG tablet Take 600 mg by mouth 3 (three) times daily.     Galcanezumab -gnlm (EMGALITY ) 120 MG/ML SOSY Inject into the skin.     glucose blood (ACCU-CHEK GUIDE) test strip Check blood sugar 2 times daily as needed 100 each 12   insulin  aspart (NOVOLOG  FLEXPEN) 100 UNIT/ML FlexPen Max daily 30 units 30 mL 3   Insulin  Glargine (BASAGLAR  KWIKPEN) 100 UNIT/ML Inject 70 Units into the skin daily. 60 mL 3   Insulin  Pen Needle 32G X 4 MM MISC 1 Device by Does not apply route in the morning, at noon, in the evening, and at bedtime. 400 each 3   ipratropium (ATROVENT ) 0.03 % nasal spray Place 2 sprays into both nostrils 2 (two) times daily. 30 mL 2   JARDIANCE  25 MG TABS tablet Take 1 tablet (25 mg total) by mouth daily. 90 tablet 2   Lancets (ACCU-CHEK MULTICLIX) lancets Check sugar 2 times daily as needed. 100 each 12   levocetirizine (XYZAL ) 5 MG tablet Take 1 tablet (5 mg total) by mouth in the morning. 34 tablet 5   nitroGLYCERIN  (NITROSTAT ) 0.4 MG SL tablet PLACE 1 TABLET UNDER THE TONGUE EVERY 5 MINUTES AS NEEDED FOR CHEST PAIN. 25 tablet 0    prednisoLONE  acetate (PRED FORTE ) 1 % ophthalmic suspension Place 1 drop into the left eye 4 (four) times daily. 10 mL 0   Rimegepant Sulfate (NURTEC) 75 MG TBDP Take 1 tablet (75 mg total) by mouth daily as needed. Max of every other day prn 16 tablet 0   Semaglutide , 1 MG/DOSE, 4 MG/3ML SOPN Inject into the skin.     spironolactone  (ALDACTONE ) 25 MG tablet Take 1 tablet (25 mg total) by mouth daily. 90 tablet 3   VENTOLIN  HFA 108 (90 Base) MCG/ACT inhaler INHALE 2 PUFFS BY MOUTH EVERY 4 HOURS AS NEEDED FOR WHEEZE OR FOR SHORTNESS OF BREATH 18 each 1   Vitamin D, Ergocalciferol, (DRISDOL) 1.25 MG (50000 UNIT) CAPS capsule Take 50,000 Units by mouth once a week.     No current facility-administered medications for this visit.    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 02/2021 Echo: EF 35-40%, glob HK, GrI DD, nl RV fxn, mildly dil LA, mod MR; b. 06/2022 Echo: EF 50-55%, no rwma, GrII DD, nl RV fxn, RVSP , mildly dil LA, mild MR, AoV sclerosis.   CKD (chronic kidney disease), stage III (HCC)    Complication of anesthesia 03/23/2023   Possible gastroparesis based off food in stomach during EGD   Coronary artery disease    a. 04/2021 Cath: LM nl, LAD 70p/m, 21m, 80d, LCX 2m, OM1 40, OM2 99 (fills via collats from OM1), RCA 60d, RPDA 75.   COVID-19 2021   Hyperlipidemia LDL goal <70    Hypertension    Ischemic cardiomyopathy    a. 02/2021 Echo: EF 35-40%, glob HK, GrI DD; b. 06/2022 Echo: EF 50-55%.   Moderate mitral regurgitation    a. 02/2021 Echo: Mod MR.   MRSA infection within last 3 months 02/25/2016   Osteomyelitis of foot (HCC) 08/26/2016   Type II diabetes mellitus (HCC)     PAST SURGICAL HISTORY: Past Surgical History:  Procedure Laterality Date   CHOLECYSTECTOMY  1999   COLONOSCOPY WITH PROPOFOL  N/A 03/25/2021   Procedure: COLONOSCOPY WITH PROPOFOL ;  Surgeon: Luke Salaam, MD;   Location: [facility] ENDOSCOPY;  Service: Gastroenterology;  Laterality: N/A;   COLONOSCOPY WITH PROPOFOL  N/A 04/17/2021   Procedure: COLONOSCOPY WITH PROPOFOL ;  Surgeon: Luke Salaam, MD;  Location[facility] ENDOSCOPY;  Service: Gastroenterology;  Laterality: N/A;   ESOPHAGOGASTRODUODENOSCOPY (EGD) WITH PROPOFOL  N/A 03/25/2021   Procedure: ESOPHAGOGASTRODUODENOSCOPY (EGD) WITH PROPOFOL ;  Surgeon: Luke Salaam, MD;  Location: [facility] ENDOSCOPY;  Service: Gastroenterology;  Laterality: N/A;   ESOPHAGOGASTRODUODENOSCOPY (EGD) WITH PROPOFOL  N/A 04/17/2021   Procedure: ESOPHAGOGASTRODUODENOSCOPY (EGD) WITH PROPOFOL ;  Surgeon: Luke Salaam, MD;  Location: [facility] ENDOSCOPY;  Service: Gastroenterology;  Laterality: N/A;   ESOPHAGOGASTRODUODENOSCOPY (EGD) WITH PROPOFOL  N/A 03/23/2023   Procedure: ESOPHAGOGASTRODUODENOSCOPY (EGD) WITH PROPOFOL ;  Surgeon: Luke Salaam, MD;  Location: [facility] ENDOSCOPY;  Service: Gastroenterology;  Laterality: N/A;   RIGHT/LEFT HEART CATH AND CORONARY ANGIOGRAPHY Bilateral 04/14/2021   Procedure: RIGHT/LEFT HEART CATH AND CORONARY ANGIOGRAPHY;  Surgeon: Sammy Crisp, MD;  Location: [facility] INVASIVE CV LAB;  Service: Cardiovascular;  Laterality: Bilateral;   TOE SURGERY Left 02/07/2016   Pinky Toe    FAMILY HISTORY: Family History  Problem Relation Age of Onset   Diabetes Mother    Ulcers Mother    Heart disease Father    AAA (abdominal aortic aneurysm) Father    Diabetes Father    Hypertension Father    Stroke Father    Alzheimer's disease Father    Heart attack Sister    Seizures Brother    Diabetes Maternal Grandmother    Breast cancer Maternal Grandmother     SOCIAL HISTORY: Social History   Socioeconomic History   Marital status: Single    Spouse name: Not on file   Number of children: 1   Years of education: Not on file   Highest education level: Some college, no degree  Occupational History   Occupation: Dealer   Tobacco Use   Smoking status: Never   Smokeless  tobacco: Never  Vaping Use   Vaping status: Never Used  Substance and Sexual Activity   Alcohol use: No    Alcohol/week: 0.0 standard drinks of alcohol   Drug use: No   Sexual activity: Yes    Partners: Male    Birth control/protection: Injection  Other Topics Concern   Not on file  Social History Narrative   She used  to work at [employer], but  Feb 2017 she left work because of  MRSA infection.osteomyelitis and uncontrolled DM. She has been back to work since Feb 2018   Lives alone   Social Drivers of Health   Financial Resource Strain: High Risk (03/22/2024)   Received from [facility]   Overall Financial Resource Strain (CARDIA)    Difficulty of Paying Living Expenses: Very hard  Food Insecurity: Food Insecurity Present (03/22/2024)   Received from [facility]   Hunger Vital Sign    Worried About Running Out of Food in the Last Year: Often true    Ran Out of Food in the Last Year: Often true  Transportation Needs: Unmet Transportation Needs (03/22/2024)   Received from [facility] - Transportation    In the past 12 months, has lack of transportation kept you from medical appointments or from getting medications?: Yes    Lack of Transportation (Non-Medical): Yes  Physical Activity: Insufficiently Active (12/10/2023)   Exercise Vital Sign    Days of Exercise per Week: 1 day    Minutes of Exercise per Session: 20 min  Stress: Stress Concern Present (12/10/2023)   [employer] of Occupational Health - Occupational Stress Questionnaire    Feeling of Stress : To some extent  Social Connections: Moderately Isolated (11/09/2023)   Social Connection and Isolation Panel [NHANES]    Frequency of Communication with Friends and Family: Three times a week    Frequency of Social Gatherings with Friends and Family: Once a week    Attends Religious Services: 1 to 4 times per year    Active Member of [employer] or Organizations:  No    Attends Banker Meetings: Not on file    Marital Status: Never married  Intimate Partner Violence: Not At Risk (05/01/2022)   Humiliation, Afraid, Rape, and Kick questionnaire    Fear of Current or Ex-Partner: No    Emotionally Abused: No    Physically Abused: No    Sexually Abused: No      Phebe Brasil, M.D. Ph.D.  [facility] [address] [place], [place] [address] Ph: [phone] Fax: [phone]  CC:  Arleen Lacer, MD [address] [place],  [place] [address]  Arleen Lacer, MD

## 2024-04-18 ENCOUNTER — Other Ambulatory Visit: Payer: Self-pay | Admitting: Family Medicine

## 2024-04-18 DIAGNOSIS — M4712 Other spondylosis with myelopathy, cervical region: Secondary | ICD-10-CM | POA: Insufficient documentation

## 2024-04-18 DIAGNOSIS — J4520 Mild intermittent asthma, uncomplicated: Secondary | ICD-10-CM

## 2024-04-19 ENCOUNTER — Telehealth: Payer: Self-pay

## 2024-04-19 DIAGNOSIS — M4712 Other spondylosis with myelopathy, cervical region: Secondary | ICD-10-CM

## 2024-04-19 DIAGNOSIS — E1165 Type 2 diabetes mellitus with hyperglycemia: Secondary | ICD-10-CM

## 2024-04-19 NOTE — Telephone Encounter (Signed)
 Per further discussion with Dr Felipe Horton, she can proceed with a fructosamine test at any time (no need to wait until the 6th week). He would also like cervical flexion extension xrays. I have placed orders for both tests. Attempted to call the patient to discuss. Left message. Will also send mychart message.

## 2024-04-19 NOTE — Telephone Encounter (Signed)
-----   Message from Carroll Clamp sent at 04/19/2024  4:17 PM EDT ----- Regarding: FW: Surgical Clearance Sounds like 5 weeks of good control at this point ----- Message ----- From: Arleen Lacer, MD Sent: 04/19/2024   8:41 AM EDT To: Carroll Clamp, MD Subject: RE: Surgical Clearance                         Sounds good. Thank you ----- Message ----- From: Carroll Clamp, MD Sent: 04/18/2024   4:27 PM EDT To: Arleen Lacer, MD Subject: RE: Surgical Clearance                         No we're happy to do the ordering for that, just wanted to make sure that it seemed like a okay plan from your standpoint.  She told me her sugars have been under much better control over the past month so we could do it as soon as 2 weeks from now ----- Message ----- From: [name], Krichna, MD Sent: 04/18/2024  11:47 AM EDT To: Carroll Clamp, MD Subject: RE: Surgical Clearance                         Would you like me to order the fructosamine? She had an endocrinologist but I can order the test if needed.  Thank you ----- Message ----- From: Carroll Clamp, MD Sent: 04/18/2024   9:27 AM EDT To: Arleen Lacer, MD Subject: Surgical Clearance                             Hi Dr. Ava Lei,   I'm concerned that her myelopathy is getting worse and it's impacting her ambulation abilities.   Our usual protocol for folks who have difficulty control diabetes as to monitor them for 6 weeks as long as they are keeping the sugars under 180 then we will get a fructosamine test to see whether or not they will be high risk for the intervention.  If you see any other active issues that would need to address?  Thank so much, [facility]  Neurosurgery

## 2024-04-20 DIAGNOSIS — R531 Weakness: Secondary | ICD-10-CM | POA: Diagnosis not present

## 2024-04-20 DIAGNOSIS — R42 Dizziness and giddiness: Secondary | ICD-10-CM | POA: Diagnosis not present

## 2024-04-20 DIAGNOSIS — R200 Anesthesia of skin: Secondary | ICD-10-CM | POA: Diagnosis not present

## 2024-04-20 DIAGNOSIS — G629 Polyneuropathy, unspecified: Secondary | ICD-10-CM | POA: Diagnosis not present

## 2024-04-20 DIAGNOSIS — M545 Low back pain, unspecified: Secondary | ICD-10-CM | POA: Diagnosis not present

## 2024-04-20 DIAGNOSIS — G479 Sleep disorder, unspecified: Secondary | ICD-10-CM | POA: Diagnosis not present

## 2024-04-20 DIAGNOSIS — R202 Paresthesia of skin: Secondary | ICD-10-CM | POA: Diagnosis not present

## 2024-04-20 DIAGNOSIS — G8929 Other chronic pain: Secondary | ICD-10-CM | POA: Diagnosis not present

## 2024-04-20 DIAGNOSIS — R2689 Other abnormalities of gait and mobility: Secondary | ICD-10-CM | POA: Diagnosis not present

## 2024-04-20 DIAGNOSIS — G43009 Migraine without aura, not intractable, without status migrainosus: Secondary | ICD-10-CM | POA: Diagnosis not present

## 2024-04-25 DIAGNOSIS — I159 Secondary hypertension, unspecified: Secondary | ICD-10-CM | POA: Diagnosis not present

## 2024-04-25 DIAGNOSIS — E119 Type 2 diabetes mellitus without complications: Secondary | ICD-10-CM | POA: Diagnosis not present

## 2024-04-25 DIAGNOSIS — I255 Ischemic cardiomyopathy: Secondary | ICD-10-CM | POA: Diagnosis not present

## 2024-04-25 DIAGNOSIS — E785 Hyperlipidemia, unspecified: Secondary | ICD-10-CM | POA: Diagnosis not present

## 2024-04-25 DIAGNOSIS — I2510 Atherosclerotic heart disease of native coronary artery without angina pectoris: Secondary | ICD-10-CM | POA: Diagnosis not present

## 2024-04-25 DIAGNOSIS — R29898 Other symptoms and signs involving the musculoskeletal system: Secondary | ICD-10-CM | POA: Diagnosis not present

## 2024-04-25 DIAGNOSIS — I429 Cardiomyopathy, unspecified: Secondary | ICD-10-CM | POA: Diagnosis not present

## 2024-04-25 DIAGNOSIS — J45909 Unspecified asthma, uncomplicated: Secondary | ICD-10-CM | POA: Diagnosis not present

## 2024-04-25 DIAGNOSIS — I5022 Chronic systolic (congestive) heart failure: Secondary | ICD-10-CM | POA: Diagnosis not present

## 2024-04-26 ENCOUNTER — Encounter: Payer: Self-pay | Admitting: Neurosurgery

## 2024-04-26 NOTE — Telephone Encounter (Signed)
 Duplicate message.

## 2024-04-27 LAB — NM MYOCAR MULTI W/SPECT W/WALL MOTION / EF
Base ST Depression (mm): 0 mm
Estimated workload: 1
Exercise duration (min): 1 min
Exercise duration (sec): 0 s
LV dias vol: 118 mL (ref 46–106)
LV sys vol: 51 mL
MPHR: 166 {beats}/min
Nuc Stress EF: 57 %
Peak HR: 84 {beats}/min
Percent HR: 50 %
Rest HR: 72 {beats}/min
Rest Nuclear Isotope Dose: 10.5 mCi
SDS: 0
SRS: 5
SSS: 4
Stress Nuclear Isotope Dose: 34.1 mCi
TID: 1.12

## 2024-05-01 DIAGNOSIS — M4800 Spinal stenosis, site unspecified: Secondary | ICD-10-CM | POA: Diagnosis not present

## 2024-05-01 DIAGNOSIS — I2510 Atherosclerotic heart disease of native coronary artery without angina pectoris: Secondary | ICD-10-CM | POA: Diagnosis not present

## 2024-05-01 DIAGNOSIS — Z1331 Encounter for screening for depression: Secondary | ICD-10-CM | POA: Diagnosis not present

## 2024-05-01 DIAGNOSIS — G43009 Migraine without aura, not intractable, without status migrainosus: Secondary | ICD-10-CM | POA: Diagnosis not present

## 2024-05-01 DIAGNOSIS — Z794 Long term (current) use of insulin: Secondary | ICD-10-CM | POA: Diagnosis not present

## 2024-05-01 DIAGNOSIS — Z59819 Housing instability, housed unspecified: Secondary | ICD-10-CM | POA: Diagnosis not present

## 2024-05-01 DIAGNOSIS — I10 Essential (primary) hypertension: Secondary | ICD-10-CM | POA: Diagnosis not present

## 2024-05-01 DIAGNOSIS — E119 Type 2 diabetes mellitus without complications: Secondary | ICD-10-CM | POA: Diagnosis not present

## 2024-05-01 DIAGNOSIS — E113513 Type 2 diabetes mellitus with proliferative diabetic retinopathy with macular edema, bilateral: Secondary | ICD-10-CM | POA: Diagnosis not present

## 2024-05-01 DIAGNOSIS — M216X2 Other acquired deformities of left foot: Secondary | ICD-10-CM | POA: Diagnosis not present

## 2024-05-01 DIAGNOSIS — Z5986 Financial insecurity: Secondary | ICD-10-CM | POA: Diagnosis not present

## 2024-05-01 DIAGNOSIS — Z5941 Food insecurity: Secondary | ICD-10-CM | POA: Diagnosis not present

## 2024-05-01 DIAGNOSIS — M25572 Pain in left ankle and joints of left foot: Secondary | ICD-10-CM | POA: Diagnosis not present

## 2024-05-01 DIAGNOSIS — Z5982 Transportation insecurity: Secondary | ICD-10-CM | POA: Diagnosis not present

## 2024-05-01 DIAGNOSIS — E1165 Type 2 diabetes mellitus with hyperglycemia: Secondary | ICD-10-CM | POA: Diagnosis not present

## 2024-05-01 DIAGNOSIS — I255 Ischemic cardiomyopathy: Secondary | ICD-10-CM | POA: Diagnosis not present

## 2024-05-01 DIAGNOSIS — I5022 Chronic systolic (congestive) heart failure: Secondary | ICD-10-CM | POA: Diagnosis not present

## 2024-05-01 DIAGNOSIS — E785 Hyperlipidemia, unspecified: Secondary | ICD-10-CM | POA: Diagnosis not present

## 2024-05-03 ENCOUNTER — Telehealth: Payer: Self-pay

## 2024-05-03 DIAGNOSIS — E113513 Type 2 diabetes mellitus with proliferative diabetic retinopathy with macular edema, bilateral: Secondary | ICD-10-CM | POA: Diagnosis not present

## 2024-05-03 DIAGNOSIS — E119 Type 2 diabetes mellitus without complications: Secondary | ICD-10-CM | POA: Diagnosis not present

## 2024-05-03 DIAGNOSIS — M79672 Pain in left foot: Secondary | ICD-10-CM | POA: Diagnosis not present

## 2024-05-03 DIAGNOSIS — I2510 Atherosclerotic heart disease of native coronary artery without angina pectoris: Secondary | ICD-10-CM | POA: Diagnosis not present

## 2024-05-03 DIAGNOSIS — Z794 Long term (current) use of insulin: Secondary | ICD-10-CM | POA: Diagnosis not present

## 2024-05-03 DIAGNOSIS — M25375 Other instability, left foot: Secondary | ICD-10-CM | POA: Diagnosis not present

## 2024-05-03 DIAGNOSIS — M5416 Radiculopathy, lumbar region: Secondary | ICD-10-CM | POA: Diagnosis not present

## 2024-05-03 NOTE — Telephone Encounter (Signed)
 Ms Sarah Duffy called and spoke with Sarah Duffy. She informed her that she plans to get her xray and fructosamine test tomorrow. She inquired about getting an epidural in the right side of her back. Per discussion with Dr Felipe Horton, Sarah Duffy informed her that it was OK to proceed with this at this time.

## 2024-05-04 ENCOUNTER — Ambulatory Visit
Admission: RE | Admit: 2024-05-04 | Discharge: 2024-05-04 | Disposition: A | Discharge status: Home / Self Care | Attending: Neurosurgery | Admitting: Neurosurgery

## 2024-05-04 ENCOUNTER — Ambulatory Visit
Admission: RE | Admit: 2024-05-04 | Discharge: 2024-05-04 | Disposition: A | Discharge status: Home / Self Care | Source: Ambulatory Visit | Attending: Neurosurgery | Admitting: Neurosurgery

## 2024-05-04 ENCOUNTER — Other Ambulatory Visit
Admission: RE | Admit: 2024-05-04 | Discharge: 2024-05-04 | Disposition: A | Discharge status: Home / Self Care | Source: Home / Self Care | Attending: Neurosurgery | Admitting: Neurosurgery

## 2024-05-04 ENCOUNTER — Telehealth: Payer: Self-pay | Admitting: Neurosurgery

## 2024-05-04 DIAGNOSIS — E1165 Type 2 diabetes mellitus with hyperglycemia: Secondary | ICD-10-CM

## 2024-05-04 DIAGNOSIS — M4802 Spinal stenosis, cervical region: Secondary | ICD-10-CM | POA: Diagnosis not present

## 2024-05-04 DIAGNOSIS — E113513 Type 2 diabetes mellitus with proliferative diabetic retinopathy with macular edema, bilateral: Secondary | ICD-10-CM | POA: Diagnosis not present

## 2024-05-04 DIAGNOSIS — M4712 Other spondylosis with myelopathy, cervical region: Secondary | ICD-10-CM | POA: Diagnosis present

## 2024-05-04 DIAGNOSIS — M542 Cervicalgia: Secondary | ICD-10-CM | POA: Diagnosis not present

## 2024-05-04 DIAGNOSIS — Z794 Long term (current) use of insulin: Secondary | ICD-10-CM | POA: Insufficient documentation

## 2024-05-04 DIAGNOSIS — I255 Ischemic cardiomyopathy: Secondary | ICD-10-CM | POA: Diagnosis not present

## 2024-05-04 DIAGNOSIS — M47812 Spondylosis without myelopathy or radiculopathy, cervical region: Secondary | ICD-10-CM | POA: Diagnosis not present

## 2024-05-04 LAB — HEMOGLOBIN A1C: Hemoglobin A1C: 9.3

## 2024-05-04 NOTE — Telephone Encounter (Signed)
 Patient would like to inquire about having a handicap placard filled out- states it is hard for her to walk most times.  I did mention to the patient that Dr.Smith is on vacation so the response may be delayed but she said just to give her a call.

## 2024-05-04 NOTE — Telephone Encounter (Signed)
 Ok to fill this out, and if so for how long?

## 2024-05-05 NOTE — Telephone Encounter (Signed)
 Please let her know this has been completed. Would she like to come and pick this up or for us  to mail it to her?

## 2024-05-05 NOTE — Telephone Encounter (Signed)
Placed up front

## 2024-05-05 NOTE — Telephone Encounter (Signed)
She will pick it up today

## 2024-05-06 LAB — FRUCTOSAMINE: Fructosamine: 431 umol/L — ABNORMAL HIGH (ref 0–285)

## 2024-05-07 DIAGNOSIS — G4733 Obstructive sleep apnea (adult) (pediatric): Secondary | ICD-10-CM | POA: Diagnosis not present

## 2024-05-09 DIAGNOSIS — Z0120 Encounter for dental examination and cleaning without abnormal findings: Secondary | ICD-10-CM | POA: Diagnosis not present

## 2024-05-10 ENCOUNTER — Other Ambulatory Visit: Payer: Self-pay

## 2024-05-10 MED ORDER — LINACLOTIDE 290 MCG PO CAPS: 290.0000 ug | ORAL_CAPSULE | Freq: Every day | ORAL | 11 refills | Status: AC

## 2024-05-11 DIAGNOSIS — M5412 Radiculopathy, cervical region: Secondary | ICD-10-CM | POA: Diagnosis not present

## 2024-05-11 DIAGNOSIS — M5416 Radiculopathy, lumbar region: Secondary | ICD-10-CM | POA: Diagnosis not present

## 2024-05-15 NOTE — Progress Notes (Signed)
 [facility] - Clinic Note  05/16/2024   CHIEF COMPLAINT Patient presents for Retina Follow Up  HISTORY OF PRESENT ILLNESS: Sarah Duffy is a 53 y.o. female who presents to the clinic today for:  HPI     Retina Follow Up   Patient presents with  Diabetic Retinopathy.  In both eyes.  This started 6 months ago.  Duration of 6 months.  Since onset it is stable.  I, the attending physician,  performed the HPI with the patient and updated documentation appropriately.        Comments   6 month retina follow up PDR [place] ? IVA OU  pt is reporting some blurred vision she denies any flashes or floaters her last reading 181 and A1C 9      Last edited by Ronelle Coffee, MD on 05/16/2024  5:39 PM.     Pt states her vision in the right eye has decreased over the past several months, but she's been dealing with back pain so it's hard for her to focus on anything else, she went to the dr in October and was told she has a lot of inflammation, she tried physical therapy, which did not help, she was put on gabapentin and lyrica , but she's on the highest dosage of both and it's not helping either, she got a epidural steroid shot on the left side of her back in March and is scheduled to have another one on the right side on Friday, she states her BP has been really good, but she's had several spike and drs don't know why  Referring physician: Arleen Lacer, MD [address] [place],  [place] [address]  HISTORICAL INFORMATION:  Selected notes from the MEDICAL RECORD NUMBER Referred by Glynis Lass, PA-C for diabetic eye exam LEE:  Ocular Hx- PDR OU, previously managed by [facility] and [facility]-   CURRENT MEDICATIONS: Current Outpatient Medications (Ophthalmic Drugs)  Medication Sig   prednisoLONE  acetate (PRED FORTE ) 1 % ophthalmic suspension Place 1 drop into the left eye 4 (four) times daily.   No current facility-administered medications for this visit. (Ophthalmic  Drugs)   Current Outpatient Medications (Other)  Medication Sig   albuterol  (VENTOLIN  HFA) 108 (90 Base) MCG/ACT inhaler INHALE 2 PUFFS BY MOUTH EVERY 4 HOURS AS NEEDED FOR WHEEZE OR FOR SHORTNESS OF BREATH   amLODipine  (NORVASC ) 5 MG tablet Take 1 tablet (5 mg total) by mouth daily.   aspirin  EC 81 MG tablet Take by mouth.   atorvastatin  (LIPITOR) 80 MG tablet Take 1 tablet (80 mg total) by mouth daily.   Blood Glucose Monitoring Suppl (ACCU-CHEK GUIDE) w/Device KIT Use to check blood sugar 2 time daily as needed   carvedilol  (COREG ) 12.5 MG tablet TAKE 1 TABLET BY MOUTH 2 TIMES DAILY.   Continuous Glucose Sensor (DEXCOM G7 SENSOR) MISC 1 DEVICE BY DOES NOT APPLY ROUTE AS DIRECTED.   ENTRESTO  24-26 MG Take 1 tablet by mouth 2 (two) times daily.   ezetimibe  (ZETIA ) 10 MG tablet Take 1 tablet (10 mg total) by mouth daily.   gabapentin (NEURONTIN) 600 MG tablet Take 600 mg by mouth 3 (three) times daily.   Galcanezumab -gnlm (EMGALITY ) 120 MG/ML SOSY Inject into the skin.   glucose blood (ACCU-CHEK GUIDE) test strip Check blood sugar 2 times daily as needed   insulin  aspart (NOVOLOG  FLEXPEN) 100 UNIT/ML FlexPen Max daily 30 units   Insulin  Glargine (BASAGLAR  KWIKPEN) 100 UNIT/ML Inject 70 Units into the  skin daily.   Insulin  Pen Needle 32G X 4 MM MISC 1 Device by Does not apply route in the morning, at noon, in the evening, and at bedtime.   ipratropium (ATROVENT ) 0.03 % nasal spray Place 2 sprays into both nostrils 2 (two) times daily.   JARDIANCE  25 MG TABS tablet Take 1 tablet (25 mg total) by mouth daily.   Lancets (ACCU-CHEK MULTICLIX) lancets Check sugar 2 times daily as needed.   levocetirizine (XYZAL ) 5 MG tablet Take 1 tablet (5 mg total) by mouth in the morning.   linaclotide  (LINZESS ) 290 MCG CAPS capsule Take 1 capsule (290 mcg total) by mouth daily before breakfast.   nitroGLYCERIN  (NITROSTAT ) 0.4 MG SL tablet PLACE 1 TABLET UNDER THE TONGUE EVERY 5 MINUTES AS NEEDED FOR CHEST PAIN.    Rimegepant Sulfate (NURTEC) 75 MG TBDP Take 1 tablet (75 mg total) by mouth daily as needed. Max of every other day prn   Semaglutide , 1 MG/DOSE, 4 MG/3ML SOPN Inject into the skin.   spironolactone  (ALDACTONE ) 25 MG tablet Take 1 tablet (25 mg total) by mouth daily.   Vitamin D, Ergocalciferol, (DRISDOL) 1.25 MG (50000 UNIT) CAPS capsule Take 50,000 Units by mouth once a week.   No current facility-administered medications for this visit. (Other)   REVIEW OF SYSTEMS: ROS   Positive for: Endocrine, Cardiovascular, Eyes, Respiratory, Psychiatric Last edited by Alise Appl, COT on 05/16/2024 10:01 AM.     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 02/2021 Echo: EF 35-40%, glob HK, GrI DD, nl RV fxn, mildly dil LA, mod MR; b. 06/2022 Echo: EF 50-55%, no rwma, GrII DD, nl RV fxn, RVSP , mildly dil LA, mild MR, AoV sclerosis.   CKD (chronic kidney disease), stage III (HCC)    Complication of anesthesia 03/23/2023   Possible gastroparesis based off food in stomach during EGD   Coronary artery disease    a. 04/2021 Cath: LM nl, LAD 70p/m, 74m, 80d, LCX 25m, OM1 40, OM2 99 (fills via collats from OM1), RCA 60d, RPDA 75.   COVID-19 2021   Hyperlipidemia LDL goal <70    Hypertension    Ischemic cardiomyopathy    a. 02/2021 Echo: EF 35-40%, glob HK, GrI DD; b. 06/2022 Echo: EF 50-55%.   Moderate mitral regurgitation    a. 02/2021 Echo: Mod MR.   MRSA infection within last 3 months 02/25/2016   Osteomyelitis of foot (HCC) 08/26/2016   Type II diabetes mellitus (HCC)    Past Surgical History:  Procedure Laterality Date   CHOLECYSTECTOMY  1999   COLONOSCOPY WITH PROPOFOL  N/A 03/25/2021   Procedure: COLONOSCOPY WITH PROPOFOL ;  Surgeon: Luke Salaam, MD;  Location: [facility] ENDOSCOPY;  Service: Gastroenterology;  Laterality: N/A;   COLONOSCOPY WITH PROPOFOL  N/A  04/17/2021   Procedure: COLONOSCOPY WITH PROPOFOL ;  Surgeon: Luke Salaam, MD;  Location: [facility] ENDOSCOPY;  Service: Gastroenterology;  Laterality: N/A;   ESOPHAGOGASTRODUODENOSCOPY (EGD) WITH PROPOFOL  N/A 03/25/2021   Procedure: ESOPHAGOGASTRODUODENOSCOPY (EGD) WITH PROPOFOL ;  Surgeon: Luke Salaam, MD;  Location: [facility] ENDOSCOPY;  Service: Gastroenterology;  Laterality: N/A;   ESOPHAGOGASTRODUODENOSCOPY (EGD) WITH PROPOFOL  N/A 04/17/2021   Procedure: ESOPHAGOGASTRODUODENOSCOPY (EGD) WITH PROPOFOL ;  Surgeon: Luke Salaam, MD;  Location: [facility] ENDOSCOPY;  Service: Gastroenterology;  Laterality: N/A;   ESOPHAGOGASTRODUODENOSCOPY (EGD) WITH PROPOFOL  N/A 03/23/2023   Procedure: ESOPHAGOGASTRODUODENOSCOPY (EGD) WITH PROPOFOL ;  Surgeon: Luke Salaam, MD;  Location: [facility] ENDOSCOPY;  Service: Gastroenterology;  Laterality: N/A;   RIGHT/LEFT HEART CATH AND CORONARY ANGIOGRAPHY Bilateral 04/14/2021   Procedure: RIGHT/LEFT HEART CATH AND CORONARY ANGIOGRAPHY;  Surgeon: Sammy Crisp, MD;  Location: [facility] INVASIVE CV LAB;  Service: Cardiovascular;  Laterality: Bilateral;   TOE SURGERY Left 02/07/2016   Pinky Toe   FAMILY HISTORY Family History  Problem Relation Age of Onset   Diabetes Mother    Ulcers Mother    Heart disease Father    AAA (abdominal aortic aneurysm) Father    Diabetes Father    Hypertension Father    Stroke Father    Alzheimer's disease Father    Heart attack Sister    Seizures Brother    Diabetes Maternal Grandmother    Breast cancer Maternal Grandmother    SOCIAL HISTORY Social History   Tobacco Use   Smoking status: Never   Smokeless tobacco: Never  Vaping Use   Vaping status: Never Used  Substance Use Topics   Alcohol use: No    Alcohol/week: 0.0 standard drinks of alcohol   Drug use: No       OPHTHALMIC EXAM:  Base Eye Exam     Visual Acuity (Snellen - Linear)       Right Left   Dist [place] 20/150 -2 20/60 -2   Dist ph [place] 20/80 -2 20/30 -2         Tonometry (Tonopen,  10:13 AM)       Right Left   Pressure 13 12         Pupils       Pupils Dark Light Shape React APD   Right PERRL 3 2 Round Brisk None   Left PERRL 3 2 Round Brisk None         Visual Fields       Left Right    Full Full         Extraocular Movement       Right Left    Full, Ortho Full, Ortho         Neuro/Psych     Oriented x3: Yes   Mood/Affect: Normal         Dilation     Both eyes: 2.5% Phenylephrine  @ 10:13 AM           Slit Lamp and Fundus Exam     Slit Lamp Exam       Right Left   Lids/Lashes Normal Normal   Conjunctiva/Sclera mild melanosis mild melanosis   Cornea mild arcus, mild tear film debris Trace tear film debris   Anterior Chamber deep and clear deep and clear   Iris Round and dilated, No NVI Round and dilated, No NVI   Lens 1+ Nuclear sclerosis, 1-2+ Cortical cataract 1+ Nuclear sclerosis, 1-2+ Cortical cataract   Anterior Vitreous Vitreous syneresis, +RBCs, blood stained vitreous condensations -- both red and white, focal fibrosis along temporal arcades Vitreous syneresis, +RBCs         Fundus Exam       Right Left   Disc Hazy view, pink and Sharp, no NVD Pink and Sharp, no NVD   C/D Ratio 0.2 0.2   Macula Flat, blunted foveal reflex, scattered MA / DBH, punctate exudates Flat, Blunted foveal reflex, scattered MA, DBH and exudates greatest temporal macula   Vessels attenuated, Tortuous, scattered fibrosis along temporal arcades attenuated, Tortuous, +NVE   Periphery attached, scattered DBH, tractional fibrosis along IT arcades, scattered mild PRP changes 360, large pre-retinal heme  settled inferiorly Attached, scattered MA / DBH, good PRP 360 with room for fill in            IMAGING AND PROCEDURES  Imaging and Procedures for 05/16/2024  OCT, Retina - OU - Both Eyes        Right Eye Quality was poor. Central Foveal Thickness: 287. Progression has worsened. Findings include no SRF, abnormal foveal contour,  intraretinal hyper-reflective material, epiretinal membrane, intraretinal fluid (Interval increase in vitreous opacities, scattered IRF / IRHM greatest temporal macula -- slightly improved, focal central PED, focal tractional edema along IT and ST arcades -- caught on widefield).   Left Eye Quality was good. Central Foveal Thickness: 272. Progression has been stable. Findings include normal foveal contour, no SRF, intraretinal hyper-reflective material, intraretinal fluid, vitreomacular adhesion (mild scattered cystic changes greatest nasal fovea and macula ).   Notes  *Images captured and stored on drive  Diagnosis / Impression:  +DME OU OD: Interval increase in vitreous opacities, scattered IRF / IRHM greatest temporal macula -- slightly improved, focal central PED, focal tractional edema along IT and ST arcades -- caught on widefield OS: mild scattered cystic changes greatest nasal fovea and macula  Clinical management:  See below  Abbreviations: NFP - Normal foveal profile. CME - cystoid macular edema. PED - pigment epithelial detachment. IRF - intraretinal fluid. SRF - subretinal fluid. EZ - ellipsoid zone. ERM - epiretinal membrane. ORA - outer retinal atrophy. ORT - outer retinal tubulation. SRHM - subretinal hyper-reflective material. IRHM - intraretinal hyper-reflective material      Intravitreal Injection, Pharmacologic Agent - OD - Right Eye       Time Out 05/16/2024. 11:27 AM. Confirmed correct patient, procedure, site, and patient consented.   Anesthesia Topical anesthesia was used. Anesthetic medications included Lidocaine  2%, Proparacaine 0.5%.   Procedure Preparation included 5% betadine to ocular surface, eyelid speculum. A (32g) needle was used.   Injection: 1.25 mg Bevacizumab  1.25mg /0.38ml   Route: Intravitreal, Site: Right Eye   NDC: H525437, Lot: [id number], Expiration date: 09/12/2024   Post-op Post injection exam found visual acuity of at least counting  fingers. The patient tolerated the procedure well. There were no complications. The patient received written and verbal post procedure care education. Post injection medications were not given.      Intravitreal Injection, Pharmacologic Agent - OS - Left Eye       Time Out 05/16/2024. 11:28 AM. Confirmed correct patient, procedure, site, and patient consented.   Anesthesia Topical anesthesia was used. Anesthetic medications included Lidocaine  4%, Proparacaine 0.5%.   Procedure Preparation included 5% betadine to ocular surface, eyelid speculum. A (32g) needle was used.   Injection: 1.25 mg Bevacizumab  1.25mg /0.65ml   Route: Intravitreal, Site: Left Eye   NDC: H525437, Lot: [id number], Expiration date: 07/18/2024   Post-op Post injection exam found visual acuity of at least counting fingers. The patient tolerated the procedure well. There were no complications. The patient received written and verbal post procedure care education. Post injection medications were not given.           ASSESSMENT/PLAN:   ICD-10-CM   1. 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 edema associated with type 2 diabetes mellitus (HCC)  E11.3513 OCT, Retina - OU - Both Eyes    Intravitreal Injection, Pharmacologic Agent - OD - Right Eye    Intravitreal Injection, Pharmacologic Agent - OS - Left Eye    Bevacizumab  (AVASTIN ) SOLN 1.25 mg    Bevacizumab  (AVASTIN ) SOLN 1.25 mg  2. Encounter for long-term (current) use of insulin  (HCC)  Z79.4     3. Long term (current) use of oral hypoglycemic drugs  Z79.84     4. Vitreous hemorrhage, right eye (HCC)  H43.11     5. Essential hypertension  I10     6. Hypertensive retinopathy of both eyes  H35.033     7. Combined forms of age-related cataract of both eyes  H25.813      1-4. Proliferative diabetic retinopathy, both eyes  - lost to f/u from 12.10.24 to 06.03.25   - previously managed by [facility] and [facility]  - h/o anti-VEGF therapy  and PRP OS  - last A1c 8.4 on 07.08.24  - s/p IVA OU #1 (11.05.24), #2 (12.03.24)  - s/p PRP OS (11.18.24)  - s/p PRP OD (12.10.24) - exam shows blood stained vit condensations and tractional fibrosis OD; scattered MA/DBH OU - FA (11.05.24) shows OD: Scattered punctate leaking NV, scattered blockage from [facility], scattered patches of vascular non-perfusion, leaking MA; OS: Scattered leaking NV -- greatest SN midzone, scattered patches of vascular non-perfusion, leaking MA, scattered incomplete PRP - OCT shows OD: Interval increase in vitreous opacities, scattered IRF / IRHM greatest temporal macula -- slightly improved, focal central PED, focal tractional edema along IT and ST arcades -- caught on widefield; OS: mild scattered cystic changes greatest nasal fovea and macula at 6 mos since last injxn  - recommend IVA OU #3 today 06.03.24 - pt wishes to proceed with laser - RBA of procedure discussed, questions answered - informed consent obtained and signed - see procedure note - f/u 4 weeks -- DFE/OCT, possible injxn  5,6. Hypertensive retinopathy OU - discussed importance of tight BP control - monitor  7. Mixed Cataract OU - The symptoms of cataract, surgical options, and treatments and risks were discussed with patient. - discussed diagnosis and progression - monitor  Ophthalmic Meds Ordered this visit:  Meds ordered this encounter  Medications   Bevacizumab  (AVASTIN ) SOLN 1.25 mg   Bevacizumab  (AVASTIN ) SOLN 1.25 mg     Return in about 4 weeks (around 06/13/2024) for f/u PDR OU, DFE, OCT, Possible Injxn.  There are no Patient Instructions on file for this visit.  Explained the diagnoses, plan, and follow up with the patient and they expressed understanding.  Patient expressed understanding of the importance of proper follow up care.   This document serves as a record of services personally performed by Jeanice Millard, MD, PhD. It was created on their behalf by Morley Arabia. Bevin Bucks, OA an  ophthalmic technician. The creation of this record is the provider's dictation and/or activities during the visit.    Electronically signed by: Morley Arabia. Bevin Bucks, OA 05/22/24 12:03 AM  Jeanice Millard, M.D., Ph.D. Diseases & Surgery of the Retina and Vitreous [facility] 05/16/2024  I have reviewed the above documentation for accuracy and completeness, and I agree with the above. Jeanice Millard, M.D., Ph.D. 05/22/24 12:13 AM    Abbreviations: M myopia (nearsighted); A astigmatism; H hyperopia (farsighted); P presbyopia; Mrx spectacle prescription;  CTL contact lenses; OD right eye; OS left eye; OU both eyes  XT exotropia; ET esotropia; PEK punctate epithelial keratitis; PEE punctate epithelial erosions; DES dry eye syndrome; MGD meibomian gland dysfunction; ATs artificial tears; PFAT's preservative free artificial tears; NSC nuclear sclerotic cataract; PSC posterior subcapsular cataract; ERM epi-retinal membrane; PVD posterior vitreous detachment; RD retinal detachment; DM diabetes mellitus; DR diabetic retinopathy; NPDR non-proliferative diabetic  retinopathy; PDR proliferative diabetic retinopathy; CSME clinically significant macular edema; DME diabetic macular edema; dbh dot blot hemorrhages; CWS cotton wool spot; POAG primary open angle glaucoma; C/D cup-to-disc ratio; HVF humphrey visual field; GVF goldmann visual field; OCT optical coherence tomography; IOP intraocular pressure; BRVO Branch retinal vein occlusion; CRVO central retinal vein occlusion; CRAO central retinal artery occlusion; BRAO branch retinal artery occlusion; RT retinal tear; SB scleral buckle; PPV pars plana vitrectomy; VH Vitreous hemorrhage; PRP panretinal laser photocoagulation; IVK intravitreal kenalog ; VMT vitreomacular traction; MH Macular hole;  NVD neovascularization of the disc; NVE neovascularization elsewhere; AREDS age related eye disease study; ARMD age related macular degeneration; POAG primary  open angle glaucoma; EBMD epithelial/anterior basement membrane dystrophy; ACIOL anterior chamber intraocular lens; IOL intraocular lens; PCIOL posterior chamber intraocular lens; Phaco/IOL phacoemulsification with intraocular lens placement; PRK photorefractive keratectomy; LASIK laser assisted in situ keratomileusis; HTN hypertension; DM diabetes mellitus; COPD chronic obstructive pulmonary disease

## 2024-05-16 ENCOUNTER — Ambulatory Visit (INDEPENDENT_AMBULATORY_CARE_PROVIDER_SITE_OTHER): Admitting: Ophthalmology

## 2024-05-16 ENCOUNTER — Encounter (INDEPENDENT_AMBULATORY_CARE_PROVIDER_SITE_OTHER): Payer: Self-pay | Admitting: Ophthalmology

## 2024-05-16 ENCOUNTER — Other Ambulatory Visit: Payer: Self-pay | Admitting: Family Medicine

## 2024-05-16 DIAGNOSIS — Z7984 Long term (current) use of oral hypoglycemic drugs: Secondary | ICD-10-CM

## 2024-05-16 DIAGNOSIS — H4311 Vitreous hemorrhage, right eye: Secondary | ICD-10-CM | POA: Diagnosis not present

## 2024-05-16 DIAGNOSIS — H35033 Hypertensive retinopathy, bilateral: Secondary | ICD-10-CM

## 2024-05-16 DIAGNOSIS — H25813 Combined forms of age-related cataract, bilateral: Secondary | ICD-10-CM

## 2024-05-16 DIAGNOSIS — I10 Essential (primary) hypertension: Secondary | ICD-10-CM

## 2024-05-16 DIAGNOSIS — J4520 Mild intermittent asthma, uncomplicated: Secondary | ICD-10-CM

## 2024-05-16 DIAGNOSIS — Z794 Long term (current) use of insulin: Secondary | ICD-10-CM

## 2024-05-16 DIAGNOSIS — E113513 Type 2 diabetes mellitus with proliferative diabetic retinopathy with macular edema, bilateral: Secondary | ICD-10-CM

## 2024-05-16 MED ORDER — BEVACIZUMAB CHEMO INJECTION 1.25MG/0.05ML SYRINGE FOR KALEIDOSCOPE
1.2500 mg | INTRAVITREAL | Status: AC | PRN
Start: 2024-05-16 — End: 2024-05-16
  Administered 2024-05-16: 1.25 mg via INTRAVITREAL

## 2024-05-16 NOTE — Telephone Encounter (Signed)
 Requested Prescriptions  Pending Prescriptions Disp Refills   albuterol  (VENTOLIN  HFA) 108 (90 Base) MCG/ACT inhaler [Pharmacy Med Name: VENTOLIN  HFA 90 MCG INHALER] 18 each 1    Sig: INHALE 2 PUFFS BY MOUTH EVERY 4 HOURS AS NEEDED FOR WHEEZE OR FOR SHORTNESS OF BREATH     Pulmonology:  Beta Agonists 2 Passed - 05/16/2024  4:45 PM      Passed - Last BP in normal range    BP Readings from Last 1 Encounters:  04/17/24 138/70         Passed - Last Heart Rate in normal range    Pulse Readings from Last 1 Encounters:  04/17/24 81         Passed - Valid encounter within last 12 months    Recent Outpatient Visits           1 month ago Dyslipidemia due to type 2 diabetes mellitus [facility])   [place] [facility] Arleen Lacer, MD   2 months ago Hypertension associated with diabetes [facility])   [facility] [name], Krichna, MD       Future Appointments             In 1 month Ava Lei, Krichna, MD [facility]

## 2024-05-17 DIAGNOSIS — K029 Dental caries, unspecified: Secondary | ICD-10-CM | POA: Diagnosis not present

## 2024-05-19 DIAGNOSIS — M5416 Radiculopathy, lumbar region: Secondary | ICD-10-CM | POA: Diagnosis not present

## 2024-05-23 ENCOUNTER — Telehealth: Payer: Self-pay

## 2024-05-23 NOTE — Telephone Encounter (Signed)
 Pharmacy Patient Advocate Encounter   Received notification from CoverMyMeds that prior authorization for Jardiance  is required/requested.   Insurance verification completed.   The patient is insured through [facility] .   Pt last seen 06/2023. Based on more recent chart notes, It looks like she is no longer being seen by this office. Please advise

## 2024-05-24 ENCOUNTER — Telehealth: Payer: Self-pay | Admitting: Neurosurgery

## 2024-05-24 DIAGNOSIS — G4733 Obstructive sleep apnea (adult) (pediatric): Secondary | ICD-10-CM | POA: Diagnosis not present

## 2024-05-24 NOTE — Telephone Encounter (Signed)
 Patient is calling to see what the next step is in her treatment plan now that her x-ray results are back. Please advise.

## 2024-05-25 DIAGNOSIS — E113513 Type 2 diabetes mellitus with proliferative diabetic retinopathy with macular edema, bilateral: Secondary | ICD-10-CM | POA: Diagnosis not present

## 2024-05-25 DIAGNOSIS — N1831 Chronic kidney disease, stage 3a: Secondary | ICD-10-CM | POA: Diagnosis not present

## 2024-05-25 DIAGNOSIS — I255 Ischemic cardiomyopathy: Secondary | ICD-10-CM | POA: Diagnosis not present

## 2024-05-25 DIAGNOSIS — E1159 Type 2 diabetes mellitus with other circulatory complications: Secondary | ICD-10-CM | POA: Diagnosis not present

## 2024-05-25 DIAGNOSIS — E1165 Type 2 diabetes mellitus with hyperglycemia: Secondary | ICD-10-CM | POA: Diagnosis not present

## 2024-05-25 DIAGNOSIS — Z794 Long term (current) use of insulin: Secondary | ICD-10-CM | POA: Diagnosis not present

## 2024-05-26 NOTE — Telephone Encounter (Signed)
 Patient scheduled for 05/31/2024 and patient confirmed.

## 2024-05-31 ENCOUNTER — Ambulatory Visit: Admitting: Neurosurgery

## 2024-05-31 ENCOUNTER — Encounter: Payer: Self-pay | Admitting: Neurosurgery

## 2024-05-31 ENCOUNTER — Other Ambulatory Visit (HOSPITAL_COMMUNITY): Payer: Self-pay

## 2024-05-31 VITALS — BP 142/80 | Ht 69.0 in | Wt 187.0 lb

## 2024-05-31 DIAGNOSIS — E1165 Type 2 diabetes mellitus with hyperglycemia: Secondary | ICD-10-CM | POA: Diagnosis not present

## 2024-05-31 DIAGNOSIS — Z794 Long term (current) use of insulin: Secondary | ICD-10-CM

## 2024-05-31 DIAGNOSIS — M4712 Other spondylosis with myelopathy, cervical region: Secondary | ICD-10-CM | POA: Diagnosis not present

## 2024-06-01 NOTE — Progress Notes (Signed)
 Referring Physician:  [name], Krichna, MD [address] [place],  [place] [address]  Primary Physician:  [name], Krichna, MD  History of Present Illness: 05/31/24 Sarah Duffy is here today to follow-up.  She continues to have signs and symptoms consistent with cervical myelopathy.  We have been waiting for her blood sugars to become under better control prior to surgery as she is at increased risk for postoperative neurologic decline, fusion failure, infection, as well as perioperative cardiopulmonary risks.  She has been working with a new endocrinologist and is on a different plan.    The symptoms are causing a significant impact on the patient's life.   Review of Systems:  A 10 point review of systems is negative, except for the pertinent positives and negatives detailed in the HPI.  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 02/2021 Echo: EF 35-40%, glob HK, GrI DD, nl RV fxn, mildly dil LA, mod MR; b. 06/2022 Echo: EF 50-55%, no rwma, GrII DD, nl RV fxn, RVSP , mildly dil LA, mild MR, AoV sclerosis.   CKD (chronic kidney disease), stage III (HCC)    Complication of anesthesia 03/23/2023   Possible gastroparesis based off food in stomach during EGD   Coronary artery disease    a. 04/2021 Cath: LM nl, LAD 70p/m, 5m, 80d, LCX 85m, OM1 40, OM2 99 (fills via collats from OM1), RCA 60d, RPDA 75.   COVID-19 2021   Hyperlipidemia LDL goal <70    Hypertension    Ischemic cardiomyopathy    a. 02/2021 Echo: EF 35-40%, glob HK, GrI DD; b. 06/2022 Echo: EF 50-55%.   Moderate mitral regurgitation    a. 02/2021 Echo: Mod MR.   MRSA infection within last 3 months 02/25/2016   Osteomyelitis of foot (HCC) 08/26/2016   Type II diabetes mellitus (HCC)     Past Surgical History: Past Surgical History:  Procedure Laterality Date   CHOLECYSTECTOMY  1999   COLONOSCOPY WITH PROPOFOL  N/A  03/25/2021   Procedure: COLONOSCOPY WITH PROPOFOL ;  Surgeon: Luke Salaam, MD;  Location: [facility] ENDOSCOPY;  Service: Gastroenterology;  Laterality: N/A;   COLONOSCOPY WITH PROPOFOL  N/A 04/17/2021   Procedure: COLONOSCOPY WITH PROPOFOL ;  Surgeon: Luke Salaam, MD;  Location: [facility] ENDOSCOPY;  Service: Gastroenterology;  Laterality: N/A;   ESOPHAGOGASTRODUODENOSCOPY (EGD) WITH PROPOFOL  N/A 03/25/2021   Procedure: ESOPHAGOGASTRODUODENOSCOPY (EGD) WITH PROPOFOL ;  Surgeon: Luke Salaam, MD;  Location: [facility] ENDOSCOPY;  Service: Gastroenterology;  Laterality: N/A;   ESOPHAGOGASTRODUODENOSCOPY (EGD) WITH PROPOFOL  N/A 04/17/2021   Procedure: ESOPHAGOGASTRODUODENOSCOPY (EGD) WITH PROPOFOL ;  Surgeon: Luke Salaam, MD;  Location: [facility] ENDOSCOPY;  Service: Gastroenterology;  Laterality: N/A;   ESOPHAGOGASTRODUODENOSCOPY (EGD) WITH PROPOFOL  N/A 03/23/2023   Procedure: ESOPHAGOGASTRODUODENOSCOPY (EGD) WITH PROPOFOL ;  Surgeon: Luke Salaam, MD;  Location: [facility] ENDOSCOPY;  Service: Gastroenterology;  Laterality: N/A;   RIGHT/LEFT HEART CATH AND CORONARY ANGIOGRAPHY Bilateral 04/14/2021   Procedure: RIGHT/LEFT HEART CATH AND CORONARY ANGIOGRAPHY;  Surgeon: Sammy Crisp, MD;  Location: [facility] INVASIVE CV LAB;  Service: Cardiovascular;  Laterality: Bilateral;   TOE SURGERY Left 02/07/2016   Pinky Toe    Allergies: Allergies as of 05/31/2024   (No Known Allergies)    Medications: Outpatient Encounter Medications as of 05/31/2024  Medication Sig   albuterol  (VENTOLIN  HFA) 108 (90 Base) MCG/ACT inhaler INHALE 2 PUFFS BY MOUTH EVERY 4 HOURS AS NEEDED FOR WHEEZE OR FOR SHORTNESS OF BREATH   amLODipine  (NORVASC )  5 MG tablet Take 1 tablet (5 mg total) by mouth daily.   aspirin  EC 81 MG tablet Take by mouth.   atorvastatin  (LIPITOR) 80 MG tablet Take 1 tablet (80 mg total) by mouth daily.   Blood Glucose Monitoring Suppl (ACCU-CHEK GUIDE) w/Device KIT Use to check blood sugar 2 time daily as needed   carvedilol  (COREG ) 12.5 MG  tablet TAKE 1 TABLET BY MOUTH 2 TIMES DAILY.   Continuous Glucose Sensor (DEXCOM G7 SENSOR) MISC 1 DEVICE BY DOES NOT APPLY ROUTE AS DIRECTED.   ENTRESTO  24-26 MG Take 1 tablet by mouth 2 (two) times daily.   gabapentin (NEURONTIN) 600 MG tablet Take 600 mg by mouth 3 (three) times daily.   Galcanezumab -gnlm (EMGALITY ) 120 MG/ML SOSY Inject into the skin.   glucose blood (ACCU-CHEK GUIDE) test strip Check blood sugar 2 times daily as needed   insulin  aspart (NOVOLOG  FLEXPEN) 100 UNIT/ML FlexPen Max daily 30 units   Insulin  Glargine (BASAGLAR  KWIKPEN) 100 UNIT/ML Inject 70 Units into the skin daily.   Insulin  Pen Needle 32G X 4 MM MISC 1 Device by Does not apply route in the morning, at noon, in the evening, and at bedtime.   ipratropium (ATROVENT ) 0.03 % nasal spray Place 2 sprays into both nostrils 2 (two) times daily.   JARDIANCE  25 MG TABS tablet Take 1 tablet (25 mg total) by mouth daily.   Lancets (ACCU-CHEK MULTICLIX) lancets Check sugar 2 times daily as needed.   levocetirizine (XYZAL ) 5 MG tablet Take 1 tablet (5 mg total) by mouth in the morning.   linaclotide  (LINZESS ) 290 MCG CAPS capsule Take 1 capsule (290 mcg total) by mouth daily before breakfast.   nitroGLYCERIN  (NITROSTAT ) 0.4 MG SL tablet PLACE 1 TABLET UNDER THE TONGUE EVERY 5 MINUTES AS NEEDED FOR CHEST PAIN.   prednisoLONE  acetate (PRED FORTE ) 1 % ophthalmic suspension Place 1 drop into the left eye 4 (four) times daily.   Rimegepant Sulfate (NURTEC) 75 MG TBDP Take 1 tablet (75 mg total) by mouth daily as needed. Max of every other day prn   Semaglutide , 1 MG/DOSE, 4 MG/3ML SOPN Inject into the skin.   spironolactone  (ALDACTONE ) 25 MG tablet Take 1 tablet (25 mg total) by mouth daily.   Vitamin D, Ergocalciferol, (DRISDOL) 1.25 MG (50000 UNIT) CAPS capsule Take 50,000 Units by mouth once a week.   ezetimibe  (ZETIA ) 10 MG tablet Take 1 tablet (10 mg total) by mouth daily.   No facility-administered encounter medications on  file as of 05/31/2024.    Social History: Social History   Tobacco Use   Smoking status: Never   Smokeless tobacco: Never  Vaping Use   Vaping status: Never Used  Substance Use Topics   Alcohol use: No    Alcohol/week: 0.0 standard drinks of alcohol   Drug use: No    Family Medical History: Family History  Problem Relation Age of Onset   Diabetes Mother    Ulcers Mother    Heart disease Father    AAA (abdominal aortic aneurysm) Father    Diabetes Father    Hypertension Father    Stroke Father    Alzheimer's disease Father    Heart attack Sister    Seizures Brother    Diabetes Maternal Grandmother    Breast cancer Maternal Grandmother     Physical Examination:   General: Patient is well developed, well nourished, calm, collected, and in no apparent distress. Attention to examination is appropriate.  Psychiatric: Patient is non-anxious.  ENT:  Oral mucosa appears well hydrated.  Neck:   Supple.  Full range of motion.  Respiratory: Patient is breathing without any difficulty.  Extremities: No edema.  Vascular: Palpable dorsal pedal pulses.  Skin:   On exposed skin, there are no abnormal skin lesions.  NEUROLOGICAL:     Awake, alert, oriented to person, place, and time.  Speech is clear and fluent. Fund of knowledge is appropriate.   Cranial Nerves: Pupils equal round and reactive to light.    Patient is tender to palpation of her lumbar paraspinals.  Strength: Also has weakness in her deltoids approximately 4- out of 5 and intrinsic hand weakness 4 out of 5.  Side Iliopsoas Quads Hamstring PF DF EHL  R 2 5 5 5 4 5   L 2 5 5 5 4 5     Has developed some hyperreflexia at the bilateral brachial radialis and has presence of a right sided Tromner reflex.  Clonus is not present.    Medical Decision Making  Imaging: MRI Lumbar Spine: IMPRESSION: 1. Multilevel degenerative changes of the lumbar spine as described above. Severe spinal canal and bilateral  lateral recess stenosis at L4-L5. 2. Moderate to severe spinal canal and bilateral lateral recess stenosis at L3-L4 and L5-S1.  I have personally reviewed the images and agree with the above interpretation.  Narrative & Impression  CLINICAL DATA:  Ascending weakness. Possible myelopathy. Weakness in both legs since 2024. Gait instability.   EXAM: MRI CERVICAL SPINE WITHOUT CONTRAST   TECHNIQUE: Multiplanar, multisequence MR imaging of the cervical spine was performed. No intravenous contrast was administered.   COMPARISON:  Thoracic and lumbar spine scoliosis series radiographs 11/22/2023   FINDINGS: Alignment: There is minimal, 1 mm grade 1 anterolisthesis of C4 on C5. 1-2 mm retrolisthesis of C5 on C6 and C6 on C7. Mild kyphotic angulation centered at C5.   Vertebrae: Vertebral body heights are maintained. Moderate to severe anterior and right C5-6 and diffuse C6-7 disc space narrowing. Mild anterior superior right C6-7 edematous marrow endplate degenerative changes. Large anterior C5-6 and C6-7 endplate osteophytes. The atlantodens interval is intact.   Cord: There is mild increased T2 signal within the right and left aspects of the cord at the C5-6 disc level (axial series 4, images 18 and 19) and within the left aspect of the cord just inferior to this (axial images 20 and 21). This is at and below region of C5-6 central canal stenosis described below. This increased T2 signal may represent chronic myelomalacia. Acute cord edema felt less likely.   Posterior Fossa, vertebral arteries, paraspinal tissues: The visualized posterior fossa is unremarkable. The vertebral artery flow voids are maintained. The visualized paraspinal soft tissues are otherwise unremarkable.   Disc levels:   C2-3: Moderate left and moderate right facet joint hypertrophy. No posterior disc bulge. No central canal or neuroforaminal stenosis.   C3-4: Moderate bilateral facet joint hypertrophy.  Note is made that the right vertebral artery extends deep into the right neural foramen (axial series 4, images 7 through 9). The left vertebral artery extends more moderately into the left neural foramen (axial series 4 images 5 through 7). Care should be taken during any neuroforaminal intervention at this level. Minimal midline posterior disc protrusion. No central canal or neuroforaminal stenosis.   C4-5: Moderate to severe right and moderate left facet joint hypertrophy. Moderate right intraforaminal disc and endplate spurring with moderate to severe right neuroforaminal stenosis. Mild right lateral recess narrowing. The left neural foramina is  patent. No central canal stenosis.   C5-6: Mild kyphotic angulation centered at C5-6. Moderate broad-based posterior disc osteophyte complex with mild midline posterior disc protrusion mildly to moderately impresses on the midline ventral cord. CSF is focally effaced posterior the cord. Moderate to high-grade bilateral intraforaminal disc and endplate spurring. Moderate to severe right greater than left neuroforaminal stenosis. Moderate central canal stenosis. The AP dimension of the central canal is narrowed down to 6 mm.   C6-7: Mild-to-moderate bilateral facet joint hypertrophy. Moderate broad-based posterior disc osteophyte complex with moderate to high-grade left and moderate right intraforaminal extension. Severe left and moderate to severe right neuroforaminal stenosis. Moderate left and mild right lateral recess narrowing. Mild to moderate central canal stenosis.   C7-T1: Mild to moderate bilateral facet joint hypertrophy. No posterior disc bulge. No central canal or neuroforaminal stenosis.   Please see report from contemporaneous MRI of the thoracic spine for evaluation of the upper thoracic spine levels.   IMPRESSION: 1. Moderate C5-6 central canal stenosis with posterior disc osteophyte complex mildly to mildly impressing  on the midline ventral cord. Mild increased T2 signal within the right and left aspects of the cord at the C5-6 disc level and within the left aspect of the cord just inferior to this. This is favored to represent chronic myelomalacia. Acute cord edema is felt less likely. 2. Additional multilevel degenerative disc and joint changes as above. 3. C5-6 moderate to severe right greater than left neuroforaminal stenosis. 4. C6-7 mild to moderate central canal stenosis. Severe left and moderate to severe right neuroforaminal stenosis. Moderate left and mild right lateral recess narrowing. 5. C4-5 moderate to severe right neuroforaminal stenosis. 6. Note is made that the right vertebral artery extends deep into the right C3-4 neural foramen. The left vertebral artery extends more moderately into the left C3-4 neural foramen. Although these are normal variants, care should be taken during any neuroforaminal intervention at this level.     Electronically Signed   By: Bertina Broccoli M.D.   On: 04/13/2024 13:01     She has a mixture of disc herniation with ventral compression as well as a slight cervical kyphosis at this level causing a increased degree of compression, there is also T2 signal change throughout likely representing myelomalacia and indicated in her progressive cervical myelopathy.  Assessment and Plan: Sarah Duffy is a pleasant 53 y.o. female with chief complaint of bilateral hip weakness and ambulation difficulty.  She has known cervical myelopathy.  We have been following her for optimization of her blood sugars prior to surgery as this is often related to high risk comorbid complications.  Her A1c prior to evaluation was higher than 10, we got a fructosamine test after she felt like she was in better control and unfortunately was still out side of safe ranges.  Her most recent A1c was 9.2.  She wanted to come in today again to discuss her care.  We discussed the importance of  having her blood sugars under control prior to surgery given the increased risk factors.  Will reach out to her PCP to see when we think there might be optimal timing for surgical intervention.  She does state that she recently started with a new endocrinologist and has been working on a new plan.  She states that her blood sugars are often in still in the 200s but are trending down.  Thank you for involving me in the care of this patient.   [facility]  [facility]  Spent a total of 30 minutes with the patient, reviewing her chart, reviewing her results, going over her imaging with her, face-to-face evaluation, and coordination of her care going forward.

## 2024-06-07 DIAGNOSIS — G4733 Obstructive sleep apnea (adult) (pediatric): Secondary | ICD-10-CM | POA: Diagnosis not present

## 2024-06-08 ENCOUNTER — Other Ambulatory Visit: Payer: Self-pay | Admitting: Family Medicine

## 2024-06-13 ENCOUNTER — Encounter (INDEPENDENT_AMBULATORY_CARE_PROVIDER_SITE_OTHER): Admitting: Ophthalmology

## 2024-06-13 DIAGNOSIS — G4733 Obstructive sleep apnea (adult) (pediatric): Secondary | ICD-10-CM | POA: Diagnosis not present

## 2024-06-13 NOTE — Progress Notes (Shared)
 [facility] - Clinic Note  06/19/2024   CHIEF COMPLAINT Patient presents for No chief complaint on file.  HISTORY OF PRESENT ILLNESS: Sarah Duffy is a 53 y.o. female who presents to the clinic today for:    Pt states her vision in the right eye has decreased over the past several months, but she's been dealing with back pain so it's hard for her to focus on anything else, she went to the dr in October and was told she has a lot of inflammation, she tried physical therapy, which did not help, she was put on gabapentin and lyrica , but she's on the highest dosage of both and it's not helping either, she got a epidural steroid shot on the left side of her back in March and is scheduled to have another one on the right side on Friday, she states her BP has been really good, but she's had several spike and drs don't know why  Referring physician: Glenard Mire, MD [address] [place],  [place] [address]  HISTORICAL INFORMATION:  Selected notes from the MEDICAL RECORD NUMBER Referred by [place], PA-C for diabetic eye exam LEE:  Ocular Hx- PDR OU, previously managed by [facility] and [facility]-   CURRENT MEDICATIONS: Current Outpatient Medications (Ophthalmic Drugs)  Medication Sig   prednisoLONE  acetate (PRED FORTE ) 1 % ophthalmic suspension Place 1 drop into the left eye 4 (four) times daily.   No current facility-administered medications for this visit. (Ophthalmic Drugs)   Current Outpatient Medications (Other)  Medication Sig   albuterol  (VENTOLIN  HFA) 108 (90 Base) MCG/ACT inhaler INHALE 2 PUFFS BY MOUTH EVERY 4 HOURS AS NEEDED FOR WHEEZE OR FOR SHORTNESS OF BREATH   amLODipine  (NORVASC ) 5 MG tablet Take 1 tablet (5 mg total) by mouth daily.   aspirin  EC 81 MG tablet Take by mouth.   atorvastatin  (LIPITOR) 80 MG tablet Take 1 tablet (80 mg total) by mouth daily.   Blood Glucose Monitoring Suppl (ACCU-CHEK GUIDE) w/Device KIT Use to check blood  sugar 2 time daily as needed   carvedilol  (COREG ) 12.5 MG tablet TAKE 1 TABLET BY MOUTH 2 TIMES DAILY.   Continuous Glucose Sensor (DEXCOM G7 SENSOR) MISC 1 DEVICE BY DOES NOT APPLY ROUTE AS DIRECTED.   ENTRESTO  24-26 MG Take 1 tablet by mouth 2 (two) times daily.   ezetimibe  (ZETIA ) 10 MG tablet Take 1 tablet (10 mg total) by mouth daily.   gabapentin (NEURONTIN) 600 MG tablet Take 600 mg by mouth 3 (three) times daily.   Galcanezumab -gnlm (EMGALITY ) 120 MG/ML SOSY Inject into the skin.   glucose blood (ACCU-CHEK GUIDE) test strip Check blood sugar 2 times daily as needed   insulin  aspart (NOVOLOG  FLEXPEN) 100 UNIT/ML FlexPen Max daily 30 units   Insulin  Glargine (BASAGLAR  KWIKPEN) 100 UNIT/ML Inject 70 Units into the skin daily.   Insulin  Pen Needle 32G X 4 MM MISC 1 Device by Does not apply route in the morning, at noon, in the evening, and at bedtime.   ipratropium (ATROVENT ) 0.03 % nasal spray Place 2 sprays into both nostrils 2 (two) times daily.   JARDIANCE  25 MG TABS tablet Take 1 tablet (25 mg total) by mouth daily.   Lancets (ACCU-CHEK MULTICLIX) lancets Check sugar 2 times daily as needed.   levocetirizine (XYZAL ) 5 MG tablet Take 1 tablet (5 mg total) by mouth in the morning.   linaclotide  (LINZESS ) 290 MCG CAPS capsule Take 1 capsule (290 mcg total)  by mouth daily before breakfast.   nitroGLYCERIN  (NITROSTAT ) 0.4 MG SL tablet PLACE 1 TABLET UNDER THE TONGUE EVERY 5 MINUTES AS NEEDED FOR CHEST PAIN.   Rimegepant Sulfate (NURTEC) 75 MG TBDP Take 1 tablet (75 mg total) by mouth daily as needed. Max of every other day prn   Semaglutide , 1 MG/DOSE, 4 MG/3ML SOPN Inject into the skin.   spironolactone  (ALDACTONE ) 25 MG tablet Take 1 tablet (25 mg total) by mouth daily.   Vitamin D, Ergocalciferol, (DRISDOL) 1.25 MG (50000 UNIT) CAPS capsule Take 50,000 Units by mouth once a week.   No current facility-administered medications for this visit. 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 02/2021 Echo: EF 35-40%, glob HK, GrI DD, nl RV fxn, mildly dil LA, mod MR; b. 06/2022 Echo: EF 50-55%, no rwma, GrII DD, nl RV fxn, RVSP , mildly dil LA, mild MR, AoV sclerosis.   CKD (chronic kidney disease), stage III (HCC)    Complication of anesthesia 03/23/2023   Possible gastroparesis based off food in stomach during EGD   Coronary artery disease    a. 04/2021 Cath: LM nl, LAD 70p/m, 36m, 80d, LCX 56m, OM1 40, OM2 99 (fills via collats from OM1), RCA 60d, RPDA 75.   COVID-19 2021   Hyperlipidemia LDL goal <70    Hypertension    Ischemic cardiomyopathy    a. 02/2021 Echo: EF 35-40%, glob HK, GrI DD; b. 06/2022 Echo: EF 50-55%.   Moderate mitral regurgitation    a. 02/2021 Echo: Mod MR.   MRSA infection within last 3 months 02/25/2016   Osteomyelitis of foot (HCC) 08/26/2016   Type II diabetes mellitus (HCC)    Past Surgical History:  Procedure Laterality Date   CHOLECYSTECTOMY  1999   COLONOSCOPY WITH PROPOFOL  N/A 03/25/2021   Procedure: COLONOSCOPY WITH PROPOFOL ;  Surgeon: Therisa Bi, MD;  [facility] ENDOSCOPY;  Service: Gastroenterology;  Laterality: N/A;   COLONOSCOPY WITH PROPOFOL  N/A 04/17/2021   Procedure: COLONOSCOPY WITH PROPOFOL ;  Surgeon: Therisa Bi, MD;  Location: [facility] ENDOSCOPY;  Service: Gastroenterology;  Laterality: N/A;   ESOPHAGOGASTRODUODENOSCOPY (EGD) WITH PROPOFOL  N/A 03/25/2021   Procedure: ESOPHAGOGASTRODUODENOSCOPY (EGD) WITH PROPOFOL ;  Surgeon: Therisa Bi, MD;  Location: [facility] ENDOSCOPY;  Service: Gastroenterology;  Laterality: N/A;   ESOPHAGOGASTRODUODENOSCOPY (EGD) WITH PROPOFOL  N/A 04/17/2021   Procedure: ESOPHAGOGASTRODUODENOSCOPY (EGD) WITH PROPOFOL ;  Surgeon: Therisa Bi, MD;  Location: [facility] ENDOSCOPY;  Service: Gastroenterology;  Laterality: N/A;    ESOPHAGOGASTRODUODENOSCOPY (EGD) WITH PROPOFOL  N/A 03/23/2023   Procedure: ESOPHAGOGASTRODUODENOSCOPY (EGD) WITH PROPOFOL ;  Surgeon: Therisa Bi, MD;  Location: [facility] ENDOSCOPY;  Service: Gastroenterology;  Laterality: N/A;   RIGHT/LEFT HEART CATH AND CORONARY ANGIOGRAPHY Bilateral 04/14/2021   Procedure: RIGHT/LEFT HEART CATH AND CORONARY ANGIOGRAPHY;  Surgeon: Mady Bruckner, MD;  Location: [facility] INVASIVE CV LAB;  Service: Cardiovascular;  Laterality: Bilateral;   TOE SURGERY Left 02/07/2016   Pinky Toe   FAMILY HISTORY Family History  Problem Relation Age of Onset   Diabetes Mother    Ulcers Mother    Heart disease Father    AAA (abdominal aortic aneurysm) Father    Diabetes Father    Hypertension Father    Stroke Father    Alzheimer's disease Father    Heart attack Sister    Seizures Brother    Diabetes Maternal Grandmother    Breast  cancer Maternal Grandmother    SOCIAL HISTORY Social History   Tobacco Use   Smoking status: Never   Smokeless tobacco: Never  Vaping Use   Vaping status: Never Used  Substance Use Topics   Alcohol use: No    Alcohol/week: 0.0 standard drinks of alcohol   Drug use: No       OPHTHALMIC EXAM:  Not recorded    IMAGING AND PROCEDURES  Imaging and Procedures for 06/19/2024         ASSESSMENT/PLAN:   ICD-10-CM   1. 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 edema associated with type 2 diabetes mellitus (HCC)  Z88.6486     2. Encounter for long-term (current) use of insulin  (HCC)  Z79.4     3. Long term (current) use of oral hypoglycemic drugs  Z79.84     4. Vitreous hemorrhage, right eye (HCC)  H43.11     5. Essential hypertension  I10     6. Hypertensive retinopathy of both eyes  H35.033     7. 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 Proliferative diabetic retinopathy, both eyes  - lost to f/u from 12.10.24 to 06.03.25   - previously managed by [facility] and [facility]  - h/o  anti-VEGF therapy and PRP OS  - last A1c 8.4 on 07.08.24  - s/p IVA OU #1 (11.05.24), #2 (12.03.24), #3 (06.03.25)  - s/p PRP OS (11.18.24)  - s/p PRP OD (12.10.24) - exam shows blood stained vit condensations and tractional fibrosis OD; scattered MA/DBH OU - FA (11.05.24) shows OD: Scattered punctate leaking NV, scattered blockage from [facility], scattered patches of vascular non-perfusion, leaking MA; OS: Scattered leaking NV -- greatest SN midzone, scattered patches of vascular non-perfusion, leaking MA, scattered incomplete PRP - OCT shows OD: Interval increase in vitreous opacities, scattered IRF / IRHM greatest temporal macula -- slightly improved, focal central PED, focal tractional edema along IT and ST arcades -- caught on widefield; OS: mild scattered cystic changes greatest nasal fovea and macula at 6 mos since last injxn  - recommend IVA OU #4 today 07.07.25 - pt wishes to proceed with laser - RBA of procedure discussed, questions answered - informed consent obtained and signed - see procedure note - f/u 4 weeks -- DFE/OCT, possible injxn  5,6. Hypertensive retinopathy OU - discussed importance of tight BP control - monitor  7. Mixed Cataract OU - The symptoms of cataract, surgical options, and treatments and risks were discussed with patient. - discussed diagnosis and progression - monitor  Ophthalmic Meds Ordered this visit:  No orders of the defined types were placed in this encounter.    No follow-ups on file.  There are no Patient Instructions on file for this visit.  Explained the diagnoses, plan, and follow up with the patient and they expressed understanding.  Patient expressed understanding of the importance of proper follow up care.   This document serves as a record of services personally performed by Redell JUDITHANN Hans, MD, PhD. It was created on their behalf by Avelina Pereyra, COA an ophthalmic technician. The creation of this record is the provider's dictation and/or  activities during the visit.   Electronically signed by: Avelina GORMAN Pereyra, COT  06/13/24  10:53 AM    Redell JUDITHANN Hans, M.D., Ph.D. Diseases & Surgery of the Retina and Vitreous [facility] 06/19/2024     Abbreviations: M myopia (nearsighted); A astigmatism; H  hyperopia (farsighted); P presbyopia; Mrx spectacle prescription;  CTL contact lenses; OD right eye; OS left eye; OU both eyes  XT exotropia; ET esotropia; PEK punctate epithelial keratitis; PEE punctate epithelial erosions; DES dry eye syndrome; MGD meibomian gland dysfunction; ATs artificial tears; PFAT's preservative free artificial tears; NSC nuclear sclerotic cataract; PSC posterior subcapsular cataract; ERM epi-retinal membrane; PVD posterior vitreous detachment; RD retinal detachment; DM diabetes mellitus; DR diabetic retinopathy; NPDR non-proliferative diabetic retinopathy; PDR proliferative diabetic retinopathy; CSME clinically significant macular edema; DME diabetic macular edema; dbh dot blot hemorrhages; CWS cotton wool spot; POAG primary open angle glaucoma; C/D cup-to-disc ratio; HVF humphrey visual field; GVF goldmann visual field; OCT optical coherence tomography; IOP intraocular pressure; BRVO Branch retinal vein occlusion; CRVO central retinal vein occlusion; CRAO central retinal artery occlusion; BRAO branch retinal artery occlusion; RT retinal tear; SB scleral buckle; PPV pars plana vitrectomy; VH Vitreous hemorrhage; PRP panretinal laser photocoagulation; IVK intravitreal kenalog ; VMT vitreomacular traction; MH Macular hole;  NVD neovascularization of the disc; NVE neovascularization elsewhere; AREDS age related eye disease study; ARMD age related macular degeneration; POAG primary open angle glaucoma; EBMD epithelial/anterior basement membrane dystrophy; ACIOL anterior chamber intraocular lens; IOL intraocular lens; PCIOL posterior chamber intraocular lens; Phaco/IOL phacoemulsification with intraocular  lens placement; PRK photorefractive keratectomy; LASIK laser assisted in situ keratomileusis; HTN hypertension; DM diabetes mellitus; COPD chronic obstructive pulmonary disease

## 2024-06-19 ENCOUNTER — Encounter (INDEPENDENT_AMBULATORY_CARE_PROVIDER_SITE_OTHER): Admitting: Ophthalmology

## 2024-06-19 DIAGNOSIS — Z794 Long term (current) use of insulin: Secondary | ICD-10-CM

## 2024-06-19 DIAGNOSIS — Z7984 Long term (current) use of oral hypoglycemic drugs: Secondary | ICD-10-CM

## 2024-06-19 DIAGNOSIS — E113513 Type 2 diabetes mellitus with proliferative diabetic retinopathy with macular edema, bilateral: Secondary | ICD-10-CM

## 2024-06-19 DIAGNOSIS — H25813 Combined forms of age-related cataract, bilateral: Secondary | ICD-10-CM

## 2024-06-19 DIAGNOSIS — H35033 Hypertensive retinopathy, bilateral: Secondary | ICD-10-CM

## 2024-06-19 DIAGNOSIS — H4311 Vitreous hemorrhage, right eye: Secondary | ICD-10-CM

## 2024-06-19 DIAGNOSIS — I10 Essential (primary) hypertension: Secondary | ICD-10-CM

## 2024-07-04 DIAGNOSIS — M5416 Radiculopathy, lumbar region: Secondary | ICD-10-CM | POA: Diagnosis not present

## 2024-07-04 DIAGNOSIS — M5412 Radiculopathy, cervical region: Secondary | ICD-10-CM | POA: Diagnosis not present

## 2024-07-04 DIAGNOSIS — M25572 Pain in left ankle and joints of left foot: Secondary | ICD-10-CM | POA: Diagnosis not present

## 2024-07-04 DIAGNOSIS — G8929 Other chronic pain: Secondary | ICD-10-CM | POA: Diagnosis not present

## 2024-07-05 ENCOUNTER — Other Ambulatory Visit (HOSPITAL_COMMUNITY): Payer: Self-pay

## 2024-07-05 ENCOUNTER — Other Ambulatory Visit: Payer: Self-pay | Admitting: Family Medicine

## 2024-07-05 ENCOUNTER — Encounter: Payer: Self-pay | Admitting: Family Medicine

## 2024-07-05 ENCOUNTER — Telehealth: Payer: Self-pay | Admitting: Pharmacy Technician

## 2024-07-05 ENCOUNTER — Ambulatory Visit: Admitting: Family Medicine

## 2024-07-05 VITALS — BP 102/62 | HR 92 | Resp 16 | Ht 69.0 in | Wt 181.6 lb

## 2024-07-05 DIAGNOSIS — E1169 Type 2 diabetes mellitus with other specified complication: Secondary | ICD-10-CM

## 2024-07-05 DIAGNOSIS — G43009 Migraine without aura, not intractable, without status migrainosus: Secondary | ICD-10-CM

## 2024-07-05 DIAGNOSIS — Z794 Long term (current) use of insulin: Secondary | ICD-10-CM | POA: Diagnosis not present

## 2024-07-05 DIAGNOSIS — N1831 Chronic kidney disease, stage 3a: Secondary | ICD-10-CM | POA: Diagnosis not present

## 2024-07-05 DIAGNOSIS — E785 Hyperlipidemia, unspecified: Secondary | ICD-10-CM | POA: Diagnosis not present

## 2024-07-05 DIAGNOSIS — G894 Chronic pain syndrome: Secondary | ICD-10-CM | POA: Diagnosis not present

## 2024-07-05 DIAGNOSIS — I209 Angina pectoris, unspecified: Secondary | ICD-10-CM

## 2024-07-05 DIAGNOSIS — I5022 Chronic systolic (congestive) heart failure: Secondary | ICD-10-CM | POA: Diagnosis not present

## 2024-07-05 DIAGNOSIS — I152 Hypertension secondary to endocrine disorders: Secondary | ICD-10-CM

## 2024-07-05 DIAGNOSIS — E1159 Type 2 diabetes mellitus with other circulatory complications: Secondary | ICD-10-CM

## 2024-07-05 DIAGNOSIS — I429 Cardiomyopathy, unspecified: Secondary | ICD-10-CM | POA: Diagnosis not present

## 2024-07-05 DIAGNOSIS — G959 Disease of spinal cord, unspecified: Secondary | ICD-10-CM

## 2024-07-05 MED ORDER — EMGALITY 120 MG/ML ~~LOC~~ SOSY: 1.0000 mL | PREFILLED_SYRINGE | SUBCUTANEOUS | 5 refills | Status: AC

## 2024-07-05 MED ORDER — NURTEC 75 MG PO TBDP: 1.0000 | ORAL_TABLET | Freq: Every day | ORAL | 0 refills | Status: AC | PRN

## 2024-07-05 NOTE — Progress Notes (Signed)
 Name: Sarah Duffy   MRN: [id number]    DOB: 1971/07/12   Date:07/05/2024       Progress Note  Subjective  Chief Complaint  Chief Complaint  Patient presents with   Medical Management of Chronic Issues   Discussed the use of AI scribe software for clinical note transcription with the patient, who gave verbal consent to proceed.  History of Present Illness Sarah Duffy is a 53 year old female with diabetes and chronic pain syndrome who presents for management of her diabetes and chronic pain.  Her diabetes management includes Ozempic  at 2 mg weekly, Basaglar  at 70 units, and Novolog  at 10 units before meals. She uses a Dexcom for monitoring. Her A1c has improved from 11.7% in March to 9.3% in May. She experiences increased hunger and frequent urination but notes some improvement in symptoms.  She has chronic pain syndrome with midline low back pain associated with spinal stenosis of the lumbar spine. The pain is exacerbated by movement and sitting, and she describes it as feeling 'locked' when trying to stand. She received an epidural injection in June which provided temporary relief. She takes gabapentin 800 mg three times daily for pain management. She has difficulty walking and is currently not working due to her back pain.  She has a history of cervical myelopathy, and neck surgery has been discussed by her doctors, but her primary concern is her back pain.  Her cardiovascular history includes coronary artery disease, angina, cardiomyopathy and chronic systolic heart failure. She takes multiple medications including Entresto , amlodipine , atorvastatin , carvedilol , ezetimibe , and spironolactone . No recent chest pain or shortness of breath but uses an extra pillow at night.  She also has a history of migraines managed with Nurtec and Emgality , noting only two episodes since starting treatment. She missed her Emgality  dose this month and requires a refill.  She has sleep apnea and uses a  CPAP machine, though she reports issues with mask fit and pressure adjustments. Current CPAP at 5-20 cm H2O    Patient Active Problem List   Diagnosis Date Noted   Spondylosis, cervical, with myelopathy 04/18/2024   Hip weakness 04/03/2024   Gait instability 04/03/2024   Chronic anemia 03/14/2024   Gait abnormality 11/30/2023   Spinal stenosis of lumbar region 11/30/2023   Chronic kidney disease, stage 3a (HCC) 11/09/2023   Weakness of extremity 09/20/2023   Arthropathy of lumbar facet joint 07/29/2023   Low back pain 07/29/2023   Pain in finger of left hand 07/29/2023   Sacroiliac joint pain 07/29/2023   Contact dermatitis and eczema 06/08/2023   Dysphagia 03/23/2023   Asthma, well controlled 03/09/2023   Depression, major, in remission (HCC) 03/09/2023   Perennial allergic rhinitis with seasonal variation 03/09/2023   Type 2 diabetes mellitus with diabetic polyneuropathy, with long-term current use of insulin  (HCC) 08/31/2022   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(HCC) 08/31/2022   Hypertension 06/11/2022   Ischemic cardiomyopathy 06/11/2022   Coronary artery disease 06/11/2022   Chronic systolic heart failure (HCC) 06/11/2022   Angina pectoris associated with type 2 diabetes mellitus (HCC) 01/05/2022   Cardiomyopathy (HCC) 04/14/2021   History of 2019 novel coronavirus disease (COVID-19) 06/23/2019   Chronic migraine w/o aura, not intractable, w/o stat migr 01/14/2018   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 associated with type 2 diabetes mellitus (HCC) 05/21/2016   Hyperlipidemia LDL goal <70 03/10/2016   MI (mitral incompetence) 02/25/2016   Insomnia 02/25/2016   Anxiety 09/04/2014  Past Surgical History:  Procedure Laterality Date   CHOLECYSTECTOMY  1999   COLONOSCOPY WITH PROPOFOL  N/A 03/25/2021   Procedure: COLONOSCOPY WITH PROPOFOL ;  Surgeon: Therisa Bi, MD;  Location: [facility] ENDOSCOPY;  Service: Gastroenterology;   Laterality: N/A;   COLONOSCOPY WITH PROPOFOL  N/A 04/17/2021   Procedure: COLONOSCOPY WITH PROPOFOL ;  Surgeon: Therisa Bi, MD;  Location: [facility] ENDOSCOPY;  Service: Gastroenterology;  Laterality: N/A;   ESOPHAGOGASTRODUODENOSCOPY (EGD) WITH PROPOFOL  N/A 03/25/2021   Procedure: ESOPHAGOGASTRODUODENOSCOPY (EGD) WITH PROPOFOL ;  Surgeon: Therisa Bi, MD;  Location[facility] ENDOSCOPY;  Service: Gastroenterology;  Laterality: N/A;   ESOPHAGOGASTRODUODENOSCOPY (EGD) WITH PROPOFOL  N/A 04/17/2021   Procedure: ESOPHAGOGASTRODUODENOSCOPY (EGD) WITH PROPOFOL ;  Surgeon: Therisa Bi, MD;  Location: [facility] ENDOSCOPY;  Service: Gastroenterology;  Laterality: N/A;   ESOPHAGOGASTRODUODENOSCOPY (EGD) WITH PROPOFOL  N/A 03/23/2023   Procedure: ESOPHAGOGASTRODUODENOSCOPY (EGD) WITH PROPOFOL ;  Surgeon: Therisa Bi, MD;  Location: [facility] ENDOSCOPY;  Service: Gastroenterology;  Laterality: N/A;   RIGHT/LEFT HEART CATH AND CORONARY ANGIOGRAPHY Bilateral 04/14/2021   Procedure: RIGHT/LEFT HEART CATH AND CORONARY ANGIOGRAPHY;  Surgeon: Mady Bruckner, MD;  Location: [facility] INVASIVE CV LAB;  Service: Cardiovascular;  Laterality: Bilateral;   TOE SURGERY Left 02/07/2016   Pinky Toe    Family History  Problem Relation Age of Onset   Diabetes Mother    Ulcers Mother    Heart disease Father    AAA (abdominal aortic aneurysm) Father    Diabetes Father    Hypertension Father    Stroke Father    Alzheimer's disease Father    Heart attack Sister    Seizures Brother    Diabetes Maternal Grandmother    Breast cancer Maternal Grandmother     Social History   Tobacco Use   Smoking status: Never   Smokeless tobacco: Never  Substance Use Topics   Alcohol use: No    Alcohol/week: 0.0 standard drinks of alcohol     Current Outpatient Medications:    albuterol  (VENTOLIN  HFA) 108 (90 Base) MCG/ACT inhaler, INHALE 2 PUFFS BY MOUTH EVERY 4 HOURS AS NEEDED FOR WHEEZE OR FOR SHORTNESS OF BREATH, Disp: 18 each, Rfl: 1   amLODipine  (NORVASC )  5 MG tablet, Take 1 tablet (5 mg total) by mouth daily., Disp: 30 tablet, Rfl: 5   aspirin  EC 81 MG tablet, Take by mouth., Disp: , Rfl:    atorvastatin  (LIPITOR) 80 MG tablet, Take 1 tablet (80 mg total) by mouth daily., Disp: 30 tablet, Rfl: 5   Blood Glucose Monitoring Suppl (ACCU-CHEK GUIDE) w/Device KIT, Use to check blood sugar 2 time daily as needed, Disp: 1 kit, Rfl: 0   carvedilol  (COREG ) 12.5 MG tablet, TAKE 1 TABLET BY MOUTH 2 TIMES DAILY., Disp: 180 tablet, Rfl: 2   Continuous Glucose Sensor (DEXCOM G7 SENSOR) MISC, 1 DEVICE BY DOES NOT APPLY ROUTE AS DIRECTED., Disp: 3 each, Rfl: 11   ENTRESTO  24-26 MG, Take 1 tablet by mouth 2 (two) times daily., Disp: 180 tablet, Rfl: 3   ezetimibe  (ZETIA ) 10 MG tablet, Take 1 tablet (10 mg total) by mouth daily., Disp: 30 tablet, Rfl: 5   gabapentin (NEURONTIN) 800 MG tablet, Take 800 mg by mouth., Disp: , Rfl:    Galcanezumab -gnlm (EMGALITY ) 120 MG/ML SOSY, Inject into the skin., Disp: , Rfl:    glucose blood (ACCU-CHEK GUIDE) test strip, Check blood sugar 2 times daily as needed, Disp: 100 each, Rfl: 12   insulin  aspart (NOVOLOG  FLEXPEN) 100 UNIT/ML FlexPen, Max daily 30 units, Disp: 30 mL, Rfl: 3   Insulin   Glargine (BASAGLAR  KWIKPEN) 100 UNIT/ML, Inject 70 Units into the skin daily., Disp: 60 mL, Rfl: 3   Insulin  Pen Needle 32G X 4 MM MISC, 1 Device by Does not apply route in the morning, at noon, in the evening, and at bedtime., Disp: 400 each, Rfl: 3   ipratropium (ATROVENT ) 0.03 % nasal spray, Place 2 sprays into both nostrils 2 (two) times daily., Disp: 30 mL, Rfl: 2   JARDIANCE  25 MG TABS tablet, Take 1 tablet (25 mg total) by mouth daily., Disp: 90 tablet, Rfl: 2   Lancets (ACCU-CHEK MULTICLIX) lancets, Check sugar 2 times daily as needed., Disp: 100 each, Rfl: 12   levocetirizine (XYZAL ) 5 MG tablet, Take 1 tablet (5 mg total) by mouth in the morning., Disp: 34 tablet, Rfl: 5   linaclotide  (LINZESS ) 290 MCG CAPS capsule, Take 1 capsule  (290 mcg total) by mouth daily before breakfast., Disp: 30 capsule, Rfl: 11   nitroGLYCERIN  (NITROSTAT ) 0.4 MG SL tablet, PLACE 1 TABLET UNDER THE TONGUE EVERY 5 MINUTES AS NEEDED FOR CHEST PAIN., Disp: 25 tablet, Rfl: 0   OZEMPIC , 2 MG/DOSE, 8 MG/3ML SOPN, Inject 2 mg into the skin once a week., Disp: , Rfl:    prednisoLONE  acetate (PRED FORTE ) 1 % ophthalmic suspension, Place 1 drop into the left eye 4 (four) times daily., Disp: 10 mL, Rfl: 0   Rimegepant Sulfate (NURTEC) 75 MG TBDP, Take 1 tablet (75 mg total) by mouth daily as needed. Max of every other day prn, Disp: 16 tablet, Rfl: 0   spironolactone  (ALDACTONE ) 25 MG tablet, Take 1 tablet (25 mg total) by mouth daily., Disp: 90 tablet, Rfl: 3   Vitamin D, Ergocalciferol, (DRISDOL) 1.25 MG (50000 UNIT) CAPS capsule, Take 50,000 Units by mouth once a week., Disp: , Rfl:    gabapentin (NEURONTIN) 600 MG tablet, Take 600 mg by mouth 3 (three) times daily. (Patient not taking: Reported on 07/05/2024), Disp: , Rfl:    Semaglutide , 1 MG/DOSE, 4 MG/3ML SOPN, Inject into the skin. (Patient not taking: Reported on 07/05/2024), Disp: , Rfl:   No Known Allergies  I personally reviewed active problem list, medication list, allergies with the patient/caregiver today.   ROS  Ten systems reviewed and is negative except as mentioned in HPI    Objective Physical Exam CONSTITUTIONAL: Patient appears well-developed and well-nourished. No distress. HEENT: Head atraumatic, normocephalic, neck supple. CARDIOVASCULAR: Normal rate, regular rhythm and normal heart sounds. No murmur heard. No BLE edema. PULMONARY: Effort normal and breath sounds normal. Lungs clear to auscultation. No respiratory distress. ABDOMINAL: There is no tenderness or distention. MUSCULOSKELETAL: Normal gait. Without gross motor or sensory deficit. PSYCHIATRIC: Patient has a normal mood and affect. Behavior is normal. Judgment and thought content normal.  Vitals:   07/05/24 1105   BP: 102/62  Pulse: 92  Resp: 16  SpO2: 98%  Weight: 181 lb 9.6 oz (82.4 kg)  Height: 5' 9 (1.753 m)    Body mass index is 26.82 kg/m.  Recent Results (from the past 2160 hours)  Hemoglobin A1c     Status: None   Collection Time: 05/04/24 12:00 AM  Result Value Ref Range   Hemoglobin A1C 9.3   Fructosamine     Status: Abnormal   Collection Time: 05/04/24 10:39 AM  Result Value Ref Range   Fructosamine 431 (H) 0 - 285 umol/L    Comment: (NOTE) Published reference interval for apparently healthy subjects between age 64 and 50 is 80 - 285 umol/L and  in a poorly controlled diabetic population is 228 - 563 umol/L with a mean of 396 umol/L. Performed At: [facility] [address] [place], [place] [id number] Jennette Shorter MD Ey:[id number]     Diabetic Foot Exam:     PHQ2/9:    07/05/2024   10:56 AM 04/10/2024   10:43 AM 03/09/2024   10:42 AM 12/27/2023    3:30 PM 12/10/2023    1:26 PM  Depression screen PHQ 2/9  Decreased Interest 0 0 1 0 0  Down, Depressed, Hopeless 0 0 1 0 0  PHQ - 2 Score 0 0 2 0 0  Altered sleeping  0 1 0 0  Tired, decreased energy  0 1 0 0  Change in appetite  0 0 0 0  Feeling bad or failure about yourself   0 1 0 0  Trouble concentrating  0 1 0 0  Moving slowly or fidgety/restless  0 0 0 0  Suicidal thoughts  0 0 0 0  PHQ-9 Score  0 6 0 0  Difficult doing work/chores  Not difficult at all Very difficult Not difficult at all     phq 9 is negative  Fall Risk:    07/05/2024   10:55 AM 12/27/2023    3:30 PM 11/09/2023    7:42 AM 10/15/2023    2:32 PM 10/04/2023   10:24 AM  Fall Risk   Falls in the past year? 0 0 0 0 0  Number falls in past yr: 0 0 0  0  Injury with Fall? 0 0 0  0  Risk for fall due to : No Fall Risks No Fall Risks No Fall Risks  No Fall Risks  Follow up Falls evaluation completed Falls prevention discussed;Education provided;Falls evaluation completed Falls prevention discussed  Falls prevention  discussed;Education provided;Falls evaluation completed     Assessment & Plan Type 2 diabetes mellitus, insulin  requiring Type 2 diabetes with improved A1c. Hunger possibly due to low protein intake. Omnipod approved, requires carb counting education. - Continue current diabetes medication regimen. - Refer to diabetic education for carb counting prior to starting Omnipod. - Encourage increased protein intake to manage hunger. - Monitor blood glucose levels regularly.  Coronary artery disease with angina/Cardiomyopathy  Coronary artery disease managed with medications. No recent angina. Bypass surgery pending diabetes control. - Continue current cardiac medication regimen.  Chronic systolic heart failure Chronic systolic heart failure managed with medications. - Continue current heart failure medication regimen. - Maintain 45-degree sleeping position as advised.  Chronic kidney disease stage 3a Chronic kidney disease stage 3a managed with renal protective medications. - Continue current renal protective medications.  Chronic low back pain with lumbar spinal stenosis and chronic pain syndrome Chronic low back pain with lumbar spinal stenosis. Recent epidural provided temporary relief. Gabapentin increased. - Continue gabapentin 800 mg three times daily. - Follow up with pain management at [facility] in August.  Cervical myelopathy Cervical myelopathy with bilateral hip weakness. Surgery high risk due to cardiac issues. Prioritizes low back pain management. - Monitor symptoms and reassess surgical options as cardiac condition allows.  Migraine without aura Migraine without aura controlled with Nurtec and Emgality . Missed Emgality  dose led to slight headache recurrence. - Refill Nurtec prescription. - Administer Emgality  as scheduled.  Obstructive sleep apnea Obstructive sleep apnea with CPAP intolerance due to mask and pressure issues. Prescriber unknown. - Investigate records to  identify CPAP prescriber for adjustment. - Contact CPAP provider to determine prescriber and adjust settings.  Hypertension Hypertension controlled with current regimen. - Continue current antihypertensive medications.  Dyslipidemia Dyslipidemia managed with atorvastatin  and ezetimibe . - Continue current lipid-lowering medications.

## 2024-07-05 NOTE — Telephone Encounter (Signed)
 Pharmacy Patient Advocate Encounter   Received notification from CoverMyMeds that prior authorization for Nurtec 75MG  dispersible tablets is required/requested.   Insurance verification completed.   The patient is insured through [facility] MEDICAID .   Per test claim: PA required; PA submitted to above mentioned insurance via CoverMyMeds Key/confirmation #/EOC [id number] Status is pending

## 2024-07-05 NOTE — Telephone Encounter (Signed)
 Pharmacy Patient Advocate Encounter  Received notification from [facility] MEDICAID that Prior Authorization for Nurtec 75MG  dispersible tablets has been APPROVED from 07/05/24 to 07/05/25. Ran test claim, Copay is $4.00. This test claim was processed through [facility]- copay amounts may vary at other pharmacies due to pharmacy/plan contracts, or as the patient moves through the different stages of their insurance plan.   PA #/Case ID/Reference #: [id number]

## 2024-07-07 DIAGNOSIS — G4733 Obstructive sleep apnea (adult) (pediatric): Secondary | ICD-10-CM | POA: Diagnosis not present

## 2024-07-10 ENCOUNTER — Ambulatory Visit: Admitting: Family Medicine

## 2024-07-19 ENCOUNTER — Emergency Department

## 2024-07-19 ENCOUNTER — Encounter: Payer: Self-pay | Admitting: Emergency Medicine

## 2024-07-19 ENCOUNTER — Other Ambulatory Visit: Payer: Self-pay

## 2024-07-19 ENCOUNTER — Observation Stay
Admission: EM | Admit: 2024-07-19 | Discharge: 2024-07-27 | Disposition: A | Discharge status: Skilled Nursing Facility | Attending: Obstetrics and Gynecology | Admitting: Obstetrics and Gynecology

## 2024-07-19 DIAGNOSIS — J4520 Mild intermittent asthma, uncomplicated: Secondary | ICD-10-CM | POA: Diagnosis not present

## 2024-07-19 DIAGNOSIS — M5000 Cervical disc disorder with myelopathy, unspecified cervical region: Secondary | ICD-10-CM | POA: Diagnosis not present

## 2024-07-19 DIAGNOSIS — I2510 Atherosclerotic heart disease of native coronary artery without angina pectoris: Secondary | ICD-10-CM | POA: Diagnosis present

## 2024-07-19 DIAGNOSIS — E663 Overweight: Secondary | ICD-10-CM | POA: Diagnosis present

## 2024-07-19 DIAGNOSIS — I130 Hypertensive heart and chronic kidney disease with heart failure and stage 1 through stage 4 chronic kidney disease, or unspecified chronic kidney disease: Secondary | ICD-10-CM | POA: Insufficient documentation

## 2024-07-19 DIAGNOSIS — I5042 Chronic combined systolic (congestive) and diastolic (congestive) heart failure: Secondary | ICD-10-CM | POA: Diagnosis not present

## 2024-07-19 DIAGNOSIS — M5442 Lumbago with sciatica, left side: Secondary | ICD-10-CM | POA: Diagnosis not present

## 2024-07-19 DIAGNOSIS — E785 Hyperlipidemia, unspecified: Secondary | ICD-10-CM | POA: Diagnosis not present

## 2024-07-19 DIAGNOSIS — R269 Unspecified abnormalities of gait and mobility: Secondary | ICD-10-CM | POA: Diagnosis not present

## 2024-07-19 DIAGNOSIS — G959 Disease of spinal cord, unspecified: Secondary | ICD-10-CM

## 2024-07-19 DIAGNOSIS — J45909 Unspecified asthma, uncomplicated: Secondary | ICD-10-CM | POA: Diagnosis not present

## 2024-07-19 DIAGNOSIS — Z794 Long term (current) use of insulin: Secondary | ICD-10-CM | POA: Insufficient documentation

## 2024-07-19 DIAGNOSIS — Y92009 Unspecified place in unspecified non-institutional (private) residence as the place of occurrence of the external cause: Secondary | ICD-10-CM

## 2024-07-19 DIAGNOSIS — W19XXXA Unspecified fall, initial encounter: Secondary | ICD-10-CM

## 2024-07-19 DIAGNOSIS — R2689 Other abnormalities of gait and mobility: Secondary | ICD-10-CM | POA: Diagnosis not present

## 2024-07-19 DIAGNOSIS — Z6825 Body mass index (BMI) 25.0-25.9, adult: Secondary | ICD-10-CM | POA: Insufficient documentation

## 2024-07-19 DIAGNOSIS — G43719 Chronic migraine without aura, intractable, without status migrainosus: Secondary | ICD-10-CM | POA: Diagnosis not present

## 2024-07-19 DIAGNOSIS — M545 Low back pain, unspecified: Secondary | ICD-10-CM | POA: Diagnosis present

## 2024-07-19 DIAGNOSIS — E1129 Type 2 diabetes mellitus with other diabetic kidney complication: Secondary | ICD-10-CM | POA: Diagnosis present

## 2024-07-19 DIAGNOSIS — I10 Essential (primary) hypertension: Secondary | ICD-10-CM | POA: Diagnosis present

## 2024-07-19 DIAGNOSIS — G8929 Other chronic pain: Secondary | ICD-10-CM | POA: Diagnosis not present

## 2024-07-19 DIAGNOSIS — M4807 Spinal stenosis, lumbosacral region: Secondary | ICD-10-CM | POA: Diagnosis not present

## 2024-07-19 DIAGNOSIS — N1831 Chronic kidney disease, stage 3a: Secondary | ICD-10-CM | POA: Diagnosis not present

## 2024-07-19 DIAGNOSIS — E1122 Type 2 diabetes mellitus with diabetic chronic kidney disease: Secondary | ICD-10-CM | POA: Diagnosis not present

## 2024-07-19 DIAGNOSIS — E669 Obesity, unspecified: Secondary | ICD-10-CM | POA: Diagnosis not present

## 2024-07-19 DIAGNOSIS — M47816 Spondylosis without myelopathy or radiculopathy, lumbar region: Secondary | ICD-10-CM | POA: Diagnosis not present

## 2024-07-19 DIAGNOSIS — G43709 Chronic migraine without aura, not intractable, without status migrainosus: Secondary | ICD-10-CM | POA: Diagnosis present

## 2024-07-19 DIAGNOSIS — M5441 Lumbago with sciatica, right side: Secondary | ICD-10-CM | POA: Diagnosis not present

## 2024-07-19 DIAGNOSIS — M48061 Spinal stenosis, lumbar region without neurogenic claudication: Principal | ICD-10-CM | POA: Diagnosis present

## 2024-07-19 DIAGNOSIS — M5126 Other intervertebral disc displacement, lumbar region: Secondary | ICD-10-CM | POA: Diagnosis not present

## 2024-07-19 MED ORDER — GABAPENTIN 300 MG PO CAPS
800.0000 mg | ORAL_CAPSULE | Freq: Once | ORAL | Status: AC
  Administered 2024-07-19: 800 mg via ORAL
  Filled 2024-07-19: qty 2

## 2024-07-19 MED ORDER — FENTANYL CITRATE PF 50 MCG/ML IJ SOSY
50.0000 ug | PREFILLED_SYRINGE | Freq: Once | INTRAMUSCULAR | Status: AC
  Administered 2024-07-19: 50 ug via INTRAMUSCULAR
  Filled 2024-07-19: qty 1

## 2024-07-19 MED ORDER — TIZANIDINE HCL 4 MG PO TABS
2.0000 mg | ORAL_TABLET | Freq: Once | ORAL | Status: AC
  Administered 2024-07-19: 2 mg via ORAL
  Filled 2024-07-19: qty 1

## 2024-07-19 NOTE — ED Provider Notes (Signed)
 [facility] Provider Note    Event Date/Time   First MD Initiated Contact with Patient 07/19/24 1825     (approximate)  History   Chief Complaint: Back Pain  HPI  Sarah Duffy is a 53 y.o. female with a past medical history of CAD, hypertension, hyperlipidemia, diabetes, chronic lower back pain presents to the emergency department for lower back pain shooting down her legs as well as falls.  According to the patient she has a history of lower back pain, sees pain management gets injections every 3 months or so.  However this injection is delayed by a month or 2 per patient.  She states over the past 1 week she has had significant worsening of her lower back pain to the point where she is now having weakness in her legs at times as well as pain shooting down both of her legs.  She states the pain shooting down the legs is not uncommon for her but it has been getting much worse in fact the patient has had 2 falls over the past week or so what she states has never happened previously.  Patient denies any incontinence.  Physical Exam   Triage Vital Signs: ED Triage Vitals  Encounter Vitals Group     BP 07/19/24 1810 127/73     Girls Systolic BP Percentile --      Girls Diastolic BP Percentile --      Boys Systolic BP Percentile --      Boys Diastolic BP Percentile --      Pulse Rate 07/19/24 1810 74     Resp 07/19/24 1810 18     Temp 07/19/24 1810 97.7 F (36.5 C)     Temp Source 07/19/24 1810 Oral     SpO2 07/19/24 1810 100 %     Weight 07/19/24 1809 190 lb (86.2 kg)     Height 07/19/24 1809 5' 9 (1.753 m)     Head Circumference --      Peak Flow --      Pain Score 07/19/24 1809 9     Pain Loc --      Pain Education --      Exclude from Growth Chart --     Most recent vital signs: Vitals:   07/19/24 1810 07/19/24 1825  BP: 127/73   Pulse: 74   Resp: 18   Temp: 97.7 F (36.5 C)   SpO2: 100% 100%    General: Awake, no distress.  CV:  Good  peripheral perfusion.  Regular rate and rhythm  Resp:  Normal effort.  Equal breath sounds bilaterally.  Abd:  No distention.  Other:  Normal sensation of bilateral lower extremities.  4/5 strength in left lower extremity 4+/5 in right lower extremity, somewhat limited by pain in the lower back.  Vascularly intact distally bilaterally.   ED Results / Procedures / Treatments   RADIOLOGY  MRI pending   MEDICATIONS ORDERED IN ED: Medications - No data to display   IMPRESSION / MDM / ASSESSMENT AND PLAN / ED COURSE  I reviewed the triage vital signs and the nursing notes.  Patient's presentation is most consistent with acute presentation with potential threat to life or bodily function.  Patient presents to the emergency department for increased lower back pain now with 2 falls over the past week or so due to pain in the back as well as feelings of weakness/shakiness in the legs.  No incontinence.  No sensory deficits or changes.  However given the patient's worsening pain now with 2 falls will obtain an MRI of the back to further evaluate.  Patient agreeable to plan of care.  Patient care signed out to oncoming provider.  FINAL CLINICAL IMPRESSION(S) / ED DIAGNOSES   Back pain   Note:  This document was prepared using Dragon voice recognition software and may include unintentional dictation errors.   Dorothyann Drivers, MD 07/19/24 ALVIE

## 2024-07-19 NOTE — ED Notes (Addendum)
 Pt back to room from MRI. Pt unable to get into bed without 2 person assist due to pain in lower legs. Menshew PA made aware.

## 2024-07-19 NOTE — ED Notes (Signed)
 Pt provided with additional warm blankets, no other comfort measures requested at this time.

## 2024-07-19 NOTE — ED Provider Notes (Signed)
-----------------------------------------   9:45 PM on 07/19/2024 -----------------------------------------  Blood pressure (!) 161/79, pulse 80, temperature 97.7 F (36.5 C), temperature source Oral, resp. rate 18, height 5' 9 (1.753 m), weight 86.2 kg, SpO2 99%.  Assuming care from Dr. Franky Moores, PA-C/NP-C.  In short, Sarah Duffy is a 53 y.o. female with a chief complaint of Back Pain .  Refer to the original H&P for additional details.  The current plan of care is to await pending MRI disposition the patient accordingly.  Patient to this point is has not asked for any medication.   ____________________________________________    ED Results / Procedures / Treatments   Labs (all labs ordered are listed, but only abnormal results are displayed) Labs Reviewed - No data to display   EKG   RADIOLOGY  I personally viewed and evaluated these images as part of my medical decision making, as well as reviewing the written report by the radiologist.  ED Provider Interpretation: ***}  No results found.   PROCEDURES:  Critical Care performed: No  Procedures   MEDICATIONS ORDERED IN ED: Medications - No data to display   IMPRESSION / MDM / ASSESSMENT AND PLAN / ED COURSE  I reviewed the triage vital signs and the nursing notes.                              Differential diagnosis includes, but is not limited to, lumbar radiculopathy, lumbar compression injury, stenosis, DDD  Patient's presentation is most consistent with acute complicated illness / injury requiring diagnostic workup.  Patient's diagnosis is consistent with ***. Patient will be discharged home with prescriptions for ***. Patient is to follow up with *** as needed or otherwise directed. Patient is given ED precautions to return to the ED for any worsening or new symptoms.   FINAL CLINICAL IMPRESSION(S) / ED DIAGNOSES   Final diagnoses:  Chronic bilateral low back pain with bilateral sciatica      Rx / DC Orders   ED Discharge Orders     None        Note:  This document was prepared using Dragon voice recognition software and may include unintentional dictation errors.

## 2024-07-19 NOTE — ED Triage Notes (Signed)
 Patient to ED via POV fro lower back pain that radiates down legs. Ongoing since March- had epidural injections for same. Pain worsening.

## 2024-07-19 NOTE — H&P (Signed)
 History and Physical    Sarah Duffy FMW:[id number] DOB: 1971-04-21 DOA: 07/19/2024  Referring MD/NP/PA:   PCP: [name], Krichna, MD   Patient coming from:  The patient is coming from home.     Chief Complaint: Worsening lower back pain, fall  HPI: Sarah Duffy is a 53 y.o. female with medical history significant of chronic lower back pain, HLD, MD, CAD, sCHF with EF 49%, CKD-3a, migraine, depression with anxiety, who presents with worsening lower back pain.  Pt states that she has chronic lower back pain since March.  She is following up with [facility] pain management clinic and getting steroid injections every 3 months. However this injection is delayed by more than a month per patient. She states over the past 1 week, her lower back pain has been significantly worsening.  The pain is constant, sharp, severe, radiating to posterior legs, associated with leg weakness and gait instability.  She cannot walk normally.  Denies numbness in the legs.  No loss control of bladder or bowel movement.  She states that she fell twice in the past week, no loss of consciousness.  She hit the back of her head 5 days ago during the fall.  Denies headache or neck pain.  Patient not on blood thinner. Patient does not have chest pain, cough, SOB.  No nausea, vomiting, diarrhea or abdominal pain.  No symptoms of UTI.  No fever or chills.  Data reviewed independently and ED Course: pt was found to have temperature normal, blood pressure 137/88, heart rate 80, RR 18, oxygen saturation 98% on room air.  Pending CBC and BMP.  Patient is placed in telemetry bed for observation.  ED physician discussed with Dr. Katrina of neurosurgery.  MRI-L spin: 1. At L3-L4, moderate to severe canal stenosis with severe right and moderate left subarticular recess stenosis. 2. At L4-L5, moderate to severe canal stenosis with severe bilateral subarticular recess stenosis. 3. At L5-S1, moderate canal stenosis with moderate to  severe bilateral subarticular recess stenosis. Moderate left and mild right foraminal stenosis.   EKG: I have personally reviewed.  Sinus rhythm, QTc 419, LAE, LAD, poor R wave progression   Review of Systems:   General: no fevers, chills, no body weight gain, has fatigue HEENT: no blurry vision, hearing changes or sore throat Respiratory: no dyspnea, coughing, wheezing CV: no chest pain, no palpitations GI: no nausea, vomiting, abdominal pain, diarrhea, constipation GU: no dysuria, burning on urination, increased urinary frequency, hematuria  Ext: has trace leg edema Neuro:  no vision change or hearing loss. Has fall and gait instability. Has bilateral leg weakness. Skin: no rash, no skin tear. MSK: has lower back pain.   Heme: No easy bruising.  Travel history: No recent long distant travel.   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 02/2021 Echo: EF 35-40%, glob HK, GrI DD, nl RV fxn, mildly dil LA, mod MR; b. 06/2022 Echo: EF 50-55%, no rwma, GrII DD, nl RV fxn, RVSP , mildly dil LA, mild MR, AoV sclerosis.   CKD (chronic kidney disease), stage III (HCC)    Complication of anesthesia 03/23/2023   Possible gastroparesis based off food in stomach during EGD   Coronary artery disease    a. 04/2021 Cath: LM nl, LAD 70p/m, 44m, 80d, LCX 1m, OM1 40, OM2 99 (fills via collats from OM1), RCA 60d, RPDA 75.   COVID-19 2021  Hyperlipidemia LDL goal <70    Hypertension    Ischemic cardiomyopathy    a. 02/2021 Echo: EF 35-40%, glob HK, GrI DD; b. 06/2022 Echo: EF 50-55%.   Moderate mitral regurgitation    a. 02/2021 Echo: Mod MR.   MRSA infection within last 3 months 02/25/2016   Osteomyelitis of foot (HCC) 08/26/2016   Type II diabetes mellitus (HCC)     Past Surgical History:  Procedure Laterality Date   CHOLECYSTECTOMY  1999   COLONOSCOPY WITH PROPOFOL  N/A 03/25/2021    Procedure: COLONOSCOPY WITH PROPOFOL ;  Surgeon: Therisa Bi, MD;  Location: [facility] ENDOSCOPY;  Service: Gastroenterology;  Laterality: N/A;   COLONOSCOPY WITH PROPOFOL  N/A 04/17/2021   Procedure: COLONOSCOPY WITH PROPOFOL ;  Surgeon: Therisa Bi, MD;  Location: [facility] ENDOSCOPY;  Service: Gastroenterology;  Laterality: N/A;   ESOPHAGOGASTRODUODENOSCOPY (EGD) WITH PROPOFOL  N/A 03/25/2021   Procedure: ESOPHAGOGASTRODUODENOSCOPY (EGD) WITH PROPOFOL ;  Surgeon: Therisa Bi, MD;  Location: [facility] ENDOSCOPY;  Service: Gastroenterology;  Laterality: N/A;   ESOPHAGOGASTRODUODENOSCOPY (EGD) WITH PROPOFOL  N/A 04/17/2021   Procedure: ESOPHAGOGASTRODUODENOSCOPY (EGD) WITH PROPOFOL ;  Surgeon: Therisa Bi, MD;  Location: [facility] ENDOSCOPY;  Service: Gastroenterology;  Laterality: N/A;   ESOPHAGOGASTRODUODENOSCOPY (EGD) WITH PROPOFOL  N/A 03/23/2023   Procedure: ESOPHAGOGASTRODUODENOSCOPY (EGD) WITH PROPOFOL ;  Surgeon: Therisa Bi, MD;  Location: [facility] ENDOSCOPY;  Service: Gastroenterology;  Laterality: N/A;   RIGHT/LEFT HEART CATH AND CORONARY ANGIOGRAPHY Bilateral 04/14/2021   Procedure: RIGHT/LEFT HEART CATH AND CORONARY ANGIOGRAPHY;  Surgeon: Mady Bruckner, MD;  Location: [facility] INVASIVE CV LAB;  Service: Cardiovascular;  Laterality: Bilateral;   TOE SURGERY Left 02/07/2016   Pinky Toe    Social History:  reports that she has never smoked. She has never used smokeless tobacco. She reports that she does not drink alcohol and does not use drugs.  Family History:  Family History  Problem Relation Age of Onset   Diabetes Mother    Ulcers Mother    Heart disease Father    AAA (abdominal aortic aneurysm) Father    Diabetes Father    Hypertension Father    Stroke Father    Alzheimer's disease Father    Heart attack Sister    Seizures Brother    Diabetes Maternal Grandmother    Breast cancer Maternal Grandmother      Prior to Admission medications   Medication Sig Start Date End Date Taking? Authorizing Provider   albuterol  (VENTOLIN  HFA) 108 (90 Base) MCG/ACT inhaler INHALE 2 PUFFS BY MOUTH EVERY 4 HOURS AS NEEDED FOR WHEEZE OR FOR SHORTNESS OF BREATH 05/16/24   [name], Krichna, MD  amLODipine  (NORVASC ) 5 MG tablet Take 1 tablet (5 mg total) by mouth daily. 03/09/24   [name], Krichna, MD  aspirin  EC 81 MG tablet Take by mouth.    [provider]  atorvastatin  (LIPITOR) 80 MG tablet Take 1 tablet (80 mg total) by mouth daily. 03/09/24   [name], Krichna, MD  Blood Glucose Monitoring Suppl (ACCU-CHEK GUIDE) w/Device KIT Use to check blood sugar 2 time daily as needed 03/24/23   [name], Ibtehal Jaralla, MD  carvedilol  (COREG ) 12.5 MG tablet TAKE 1 TABLET BY MOUTH 2 TIMES DAILY. 03/15/24   Gerard Frederick, NP  Continuous Glucose Sensor (DEXCOM G7 SENSOR) MISC 1 DEVICE BY DOES NOT APPLY ROUTE AS DIRECTED. 10/25/23   [name], Ibtehal Jaralla, MD  ENTRESTO  24-26 MG Take 1 tablet by mouth 2 (two) times daily. 04/13/24 04/13/25  Gerard Frederick, NP  ezetimibe  (ZETIA ) 10 MG tablet Take 1 tablet (10 mg total) by mouth daily.  03/09/24   [name], Krichna, MD  gabapentin  (NEURONTIN ) 800 MG tablet Take 800 mg by mouth 3 (three) times daily. 05/03/24   [provider]  Galcanezumab -gnlm (EMGALITY ) 120 MG/ML SOSY Inject 1 mL into the skin every 30 (thirty) days. 07/05/24   [name], Krichna, MD  glucose blood (ACCU-CHEK GUIDE) test strip Check blood sugar 2 times daily as needed 03/24/23   [name], Ibtehal Jaralla, MD  insulin  aspart (NOVOLOG  FLEXPEN) 100 UNIT/ML FlexPen Max daily 30 units 06/21/23   [name], Ibtehal Jaralla, MD  Insulin  Glargine (BASAGLAR  KWIKPEN) 100 UNIT/ML Inject 70 Units into the skin daily. 06/21/23   [name], Ibtehal Jaralla, MD  Insulin  Pen Needle 32G X 4 MM MISC 1 Device by Does not apply route in the morning, at noon, in the evening, and at bedtime. 06/21/23   [name], Ibtehal Jaralla, MD  ipratropium (ATROVENT ) 0.03 % nasal spray Place 2 sprays into both nostrils 2 (two) times daily.  12/10/23   [name], Krichna, MD  JARDIANCE  25 MG TABS tablet Take 1 tablet (25 mg total) by mouth daily. 06/21/23   [name], Ibtehal Jaralla, MD  Lancets (ACCU-CHEK MULTICLIX) lancets Check sugar 2 times daily as needed. 03/24/23   [name], Ibtehal Jaralla, MD  levocetirizine (XYZAL ) 5 MG tablet Take 1 tablet (5 mg total) by mouth in the morning. 01/31/24   [name], Krichna, MD  linaclotide  (LINZESS ) 290 MCG CAPS capsule Take 1 capsule (290 mcg total) by mouth daily before breakfast. 05/10/24   Therisa Bi, MD  nitroGLYCERIN  (NITROSTAT ) 0.4 MG SL tablet PLACE 1 TABLET UNDER THE TONGUE EVERY 5 MINUTES AS NEEDED FOR CHEST PAIN. 01/24/24   Darliss Rogue, MD  OZEMPIC , 2 MG/DOSE, 8 MG/3ML SOPN Inject 2 mg into the skin once a week.    [provider]  prednisoLONE  acetate (PRED FORTE ) 1 % ophthalmic suspension Place 1 drop into the left eye 4 (four) times daily. 11/02/23   Valdemar Rogue, MD  Rimegepant Sulfate  (NURTEC) 75 MG TBDP Take 1 tablet (75 mg total) by mouth daily as needed. Max of every other day prn 07/05/24   [name], Krichna, MD  spironolactone  (ALDACTONE ) 25 MG tablet Take 1 tablet (25 mg total) by mouth daily. 12/30/23   Darliss Rogue, MD  Vitamin D, Ergocalciferol, (DRISDOL) 1.25 MG (50000 UNIT) CAPS capsule Take 50,000 Units by mouth once a week. 02/28/24   [provider]    Physical Exam: Vitals:   07/19/24 1825 07/19/24 2132 07/19/24 2249 07/20/24 0035  BP:  (!) 161/79 137/88 (!) 145/78  Pulse:  80 79 79  Resp:  18 18 15   Temp:    97.9 F (36.6 C)  TempSrc:    Oral  SpO2: 100% 99% 98% 98%  Weight:      Height:       General: Not in acute distress HEENT:       Eyes: PERRL, EOMI, no jaundice       ENT: No discharge from the ears and nose, no pharynx injection, no tonsillar enlargement.        Neck: No JVD, no bruit, no mass felt. Heme: No neck lymph node enlargement. Cardiac: S1/S2, RRR, no gallops or rubs. Respiratory: No rales, wheezing, rhonchi or  rubs. GI: Soft, nondistended, nontender, no rebound pain, no organomegaly, BS present. GU: No hematuria Ext: has trace leg edema bilaterally. 1+DP/PT pulse bilaterally. Musculoskeletal: has tenderness in the midline of lower back Skin: No rashes.  Neuro: Alert, oriented X3, cranial nerves II-XII grossly intact, moves all extremities  normally. Muscle strength 4/5 in both legs, and 5/5 in arms. Sensation to light touch intact.  Psych: Patient is not psychotic, no suicidal or hemocidal ideation.  Labs on Admission: I have personally reviewed following labs and imaging studies  CBC: Recent Labs  Lab 07/20/24 0040  WBC 6.2  HGB 10.1*  HCT 30.5*  MCV 87.1  PLT 200   Basic Metabolic Panel: No results for input(s): NA, K, CL, CO2, GLUCOSE, BUN, CREATININE, CALCIUM , MG, PHOS in the last 168 hours. GFR: CrCl cannot be calculated (Patient's most recent lab result is older than the maximum 21 days allowed.). Liver Function Tests: No results for input(s): AST, ALT, ALKPHOS, BILITOT, PROT, ALBUMIN in the last 168 hours. No results for input(s): LIPASE, AMYLASE in the last 168 hours. No results for input(s): AMMONIA in the last 168 hours. Coagulation Profile: No results for input(s): INR, PROTIME in the last 168 hours. Cardiac Enzymes: No results for input(s): CKTOTAL, CKMB, CKMBINDEX, TROPONINI in the last 168 hours. BNP (last 3 results) No results for input(s): PROBNP in the last 8760 hours. HbA1C: No results for input(s): HGBA1C in the last 72 hours. CBG: No results for input(s): GLUCAP in the last 168 hours. Lipid Profile: No results for input(s): CHOL, HDL, LDLCALC, TRIG, CHOLHDL, LDLDIRECT in the last 72 hours. Thyroid  Function Tests: No results for input(s): TSH, T4TOTAL, FREET4, T3FREE, THYROIDAB in the last 72 hours. Anemia Panel: No results for input(s): VITAMINB12, FOLATE, FERRITIN, TIBC,  IRON, RETICCTPCT in the last 72 hours. Urine analysis:    Component Value Date/Time   COLORURINE STRAW (A) 09/17/2021 1414   APPEARANCEUR CLEAR 09/17/2021 1414   LABSPEC 1.024 09/17/2021 1414   PHURINE 5.0 09/17/2021 1414   GLUCOSEU >=500 (A) 09/17/2021 1414   HGBUR SMALL (A) 09/17/2021 1414   BILIRUBINUR NEGATIVE 09/17/2021 1414   KETONESUR NEGATIVE 09/17/2021 1414   PROTEINUR NEGATIVE 09/17/2021 1414   NITRITE NEGATIVE 09/17/2021 1414   LEUKOCYTESUR NEGATIVE 09/17/2021 1414   Sepsis Labs: @LABRCNTIP (procalcitonin:4,lacticidven:4) )No results found for this or any previous visit (from the past 240 hours).   Radiological Exams on Admission:   Assessment/Plan Principal Problem:   Lumbar spinal stenosis Active Problems:   Fall at home, initial encounter   Coronary artery disease   Hypertension   Chronic combined systolic and diastolic CHF (congestive heart failure) (HCC)   Hyperlipidemia LDL goal <70   Type II diabetes mellitus with renal manifestations (HCC)   Asthma, well controlled   Chronic kidney disease, stage 3a (HCC)   Chronic migraine w/o aura, not intractable, w/o stat migr   Overweight (BMI 25.0-29.9)   Assessment and Plan:  Lumbar spinal stenosis: MRI of lumbar spine showed moderate to severe canal stenosis with subarticular recess stenosis in L3-L4, L4-L5, L5-S1 levels.  Patient has severe pain, leg weakness and gait instability.  Patient lives alone at home.  ED physician discussed with Dr. Katrina of neurosurgery, patient is not a candidate for surgical treatment.  -Place in telemetry bed for observation. - As needed morphine , Percocet, Tylenol  - Lidoderm  patch -Prednisone  50 mg daily - As needed Robaxin  - Continue home gabapentin  - PT/OT - Consult TOC for home health need  Fall at home, initial encounter: Reports head injury. - Fall precaution - PT/OT - Follow-up CT of head - Follow-up CBC and BMP which are pending  Coronary artery  disease: No chest pain -Continue aspirin , Lipitor, Zetia  - As needed nitroglycerin   Hypertension -IV hydralazine  as needed - Patient is on spironolactone , amlodipine  and  Entresto   Chronic combined systolic and diastolic CHF (congestive heart failure) (HCC): 2D echo on 04/02/2024 showed EF of 49% with grade 1 diastolic dysfunction.  Patient has trace leg edema, no SOB, no JVD, CHF seem to be compensated. -Continue home spironolactone , Entresto , Coreg  - Check BNP  Hyperlipidemia LDL goal <70 -Lipitor, Zetia   Type II diabetes mellitus with renal manifestations [facility]): Recent A1c 9.3, poorly controlled.  Patient taking NovoLog , Jardiance , Ozempic  and glargine insulin  70 unit nightly -SSI - Glargine insulin  50 units daily  Asthma, well controlled: Stable -Code atelectasis as needed Mucinex   Chronic kidney disease, stage 3a (HCC): Recent baseline creatinine 1.0 on 03/22/24. -Follow-up with BMP  Chronic migraine w/o aura, not intractable, w/o stat migraine: Patient is on Emgality  injection every month for prophylaxis. -As needed Nurtec  Overweight (BMI 25.0-29.9): Body weight 86.2 kg, BMI 28.04 - Encourage losing weight - Exercise and healthy diet       DVT ppx: SQ Lovenox   Code Status: Full code   Family Communication:     not done, no family member is at bed side.      Disposition Plan:  Anticipate discharge back to previous environment  Consults called:  ED physician discussed with Dr. Katrina of neurosurgery.  Admission status and Level of care: Telemetry Medical:    for obs       Dispo: The patient is from: Home              Anticipated d/c is to: Home              Anticipated d/c date is: 1 day              Patient currently is not medically stable to d/c.    Severity of Illness:  The appropriate patient status for this patient is OBSERVATION. Observation status is judged to be reasonable and necessary in order to provide the required intensity of service to  ensure the patient's safety. The patient's presenting symptoms, physical exam findings, and initial radiographic and laboratory data in the context of their medical condition is felt to place them at decreased risk for further clinical deterioration. Furthermore, it is anticipated that the patient will be medically stable for discharge from the hospital within 2 midnights of admission.        Date of Service 07/20/2024    Caleb Exon [facility]   If 7PM-7AM, please contact night-coverage [email or web address] 07/20/2024, 12:55 AM

## 2024-07-19 NOTE — ED Notes (Signed)
Pt ambulatory to toilet with walker.  

## 2024-07-20 ENCOUNTER — Observation Stay

## 2024-07-20 DIAGNOSIS — I6523 Occlusion and stenosis of bilateral carotid arteries: Secondary | ICD-10-CM | POA: Diagnosis not present

## 2024-07-20 DIAGNOSIS — E663 Overweight: Secondary | ICD-10-CM | POA: Diagnosis present

## 2024-07-20 DIAGNOSIS — G959 Disease of spinal cord, unspecified: Secondary | ICD-10-CM | POA: Diagnosis not present

## 2024-07-20 DIAGNOSIS — R2689 Other abnormalities of gait and mobility: Secondary | ICD-10-CM

## 2024-07-20 DIAGNOSIS — M48061 Spinal stenosis, lumbar region without neurogenic claudication: Secondary | ICD-10-CM | POA: Diagnosis not present

## 2024-07-20 DIAGNOSIS — S0990XA Unspecified injury of head, initial encounter: Secondary | ICD-10-CM | POA: Diagnosis not present

## 2024-07-20 DIAGNOSIS — I5042 Chronic combined systolic (congestive) and diastolic (congestive) heart failure: Secondary | ICD-10-CM | POA: Diagnosis present

## 2024-07-20 DIAGNOSIS — M48062 Spinal stenosis, lumbar region with neurogenic claudication: Secondary | ICD-10-CM

## 2024-07-20 DIAGNOSIS — Y92009 Unspecified place in unspecified non-institutional (private) residence as the place of occurrence of the external cause: Secondary | ICD-10-CM

## 2024-07-20 DIAGNOSIS — E1129 Type 2 diabetes mellitus with other diabetic kidney complication: Secondary | ICD-10-CM | POA: Diagnosis present

## 2024-07-20 LAB — BASIC METABOLIC PANEL WITH GFR
Anion gap: 9 (ref 5–15)
BUN: 34 mg/dL — ABNORMAL HIGH (ref 6–20)
CO2: 23 mmol/L (ref 22–32)
Calcium: 9.1 mg/dL (ref 8.9–10.3)
Chloride: 107 mmol/L (ref 98–111)
Creatinine, Ser: 1.28 mg/dL — ABNORMAL HIGH (ref 0.44–1.00)
GFR, Estimated: 50 mL/min — ABNORMAL LOW (ref >60)
Glucose, Bld: 286 mg/dL — ABNORMAL HIGH (ref 70–99)
Potassium: 5 mmol/L (ref 3.5–5.1)
Sodium: 139 mmol/L (ref 135–145)

## 2024-07-20 LAB — CBC
HCT: 30.5 % — ABNORMAL LOW (ref 36.0–46.0)
Hemoglobin: 10.1 g/dL — ABNORMAL LOW (ref 12.0–15.0)
MCH: 28.9 pg (ref 26.0–34.0)
MCHC: 33.1 g/dL (ref 30.0–36.0)
MCV: 87.1 fL (ref 80.0–100.0)
Platelets: 200 K/uL (ref 150–400)
RBC: 3.5 MIL/uL — ABNORMAL LOW (ref 3.87–5.11)
RDW: 12.7 % (ref 11.5–15.5)
WBC: 6.2 K/uL (ref 4.0–10.5)
nRBC: 0 % (ref 0.0–0.2)

## 2024-07-20 LAB — GLUCOSE, CAPILLARY
Glucose-Capillary: 274 mg/dL — ABNORMAL HIGH (ref 70–99)
Glucose-Capillary: 277 mg/dL — ABNORMAL HIGH (ref 70–99)
Glucose-Capillary: 358 mg/dL — ABNORMAL HIGH (ref 70–99)
Glucose-Capillary: 269 mg/dL — ABNORMAL HIGH (ref 70–99)
Glucose-Capillary: 268 mg/dL — ABNORMAL HIGH (ref 70–99)

## 2024-07-20 LAB — HIV ANTIBODY (ROUTINE TESTING W REFLEX): HIV Screen 4th Generation wRfx: NONREACTIVE

## 2024-07-20 LAB — BRAIN NATRIURETIC PEPTIDE: B Natriuretic Peptide: 84.7 pg/mL (ref 0.0–100.0)

## 2024-07-20 MED ORDER — ALBUTEROL SULFATE (2.5 MG/3ML) 0.083% IN NEBU: 2.5000 mg | INHALATION_SOLUTION | RESPIRATORY_TRACT | Status: DC | PRN

## 2024-07-20 MED ORDER — OXYCODONE-ACETAMINOPHEN 5-325 MG PO TABS
1.0000 | ORAL_TABLET | ORAL | Status: DC | PRN
  Administered 2024-07-20 – 2024-07-21 (×5): 1 via ORAL
  Filled 2024-07-20 (×5): qty 1

## 2024-07-20 MED ORDER — ASPIRIN 81 MG PO TBEC
81.0000 mg | DELAYED_RELEASE_TABLET | Freq: Every day | ORAL | Status: DC
Start: 2024-07-20 — End: 2024-07-27
  Administered 2024-07-20 – 2024-07-26 (×10): 81 mg via ORAL
  Filled 2024-07-20 (×7): qty 1

## 2024-07-20 MED ORDER — ACETAMINOPHEN 325 MG PO TABS
650.0000 mg | ORAL_TABLET | Freq: Four times a day (QID) | ORAL | Status: DC | PRN
  Administered 2024-07-24 – 2024-07-25 (×4): 650 mg via ORAL
  Filled 2024-07-20 (×2): qty 2

## 2024-07-20 MED ORDER — INSULIN GLARGINE-YFGN 100 UNIT/ML ~~LOC~~ SOLN
60.0000 [IU] | Freq: Every day | SUBCUTANEOUS | Status: DC
  Administered 2024-07-20 – 2024-07-23 (×4): 60 [IU] via SUBCUTANEOUS
  Filled 2024-07-20 (×4): qty 0.6

## 2024-07-20 MED ORDER — HYDRALAZINE HCL 20 MG/ML IJ SOLN: 5.0000 mg | INTRAMUSCULAR | Status: DC | PRN

## 2024-07-20 MED ORDER — ATORVASTATIN CALCIUM 20 MG PO TABS
80.0000 mg | ORAL_TABLET | Freq: Every day | ORAL | Status: DC
  Administered 2024-07-20 – 2024-07-27 (×11): 80 mg via ORAL
  Filled 2024-07-20 (×8): qty 4

## 2024-07-20 MED ORDER — INSULIN ASPART 100 UNIT/ML IJ SOLN
0.0000 [IU] | Freq: Every day | INTRAMUSCULAR | Status: DC
  Administered 2024-07-20 (×2): 3 [IU] via SUBCUTANEOUS
  Administered 2024-07-21 – 2024-07-22 (×2): 2 [IU] via SUBCUTANEOUS
  Filled 2024-07-20 (×5): qty 1

## 2024-07-20 MED ORDER — NITROGLYCERIN 0.4 MG SL SUBL: 0.4000 mg | SUBLINGUAL_TABLET | SUBLINGUAL | Status: DC | PRN

## 2024-07-20 MED ORDER — GABAPENTIN 400 MG PO CAPS
800.0000 mg | ORAL_CAPSULE | Freq: Three times a day (TID) | ORAL | Status: DC
  Administered 2024-07-20 – 2024-07-27 (×31): 800 mg via ORAL
  Filled 2024-07-20 (×22): qty 2

## 2024-07-20 MED ORDER — INSULIN GLARGINE-YFGN 100 UNIT/ML ~~LOC~~ SOLN
50.0000 [IU] | Freq: Every day | SUBCUTANEOUS | Status: DC
  Administered 2024-07-20: 50 [IU] via SUBCUTANEOUS
  Filled 2024-07-20: qty 0.5

## 2024-07-20 MED ORDER — INSULIN ASPART 100 UNIT/ML IJ SOLN
8.0000 [IU] | Freq: Three times a day (TID) | INTRAMUSCULAR | Status: DC
  Administered 2024-07-20 – 2024-07-23 (×9): 8 [IU] via SUBCUTANEOUS
  Filled 2024-07-20 (×9): qty 1

## 2024-07-20 MED ORDER — MORPHINE SULFATE (PF) 2 MG/ML IV SOLN: 2.0000 mg | INTRAVENOUS | Status: DC | PRN

## 2024-07-20 MED ORDER — SACUBITRIL-VALSARTAN 24-26 MG PO TABS
1.0000 | ORAL_TABLET | Freq: Two times a day (BID) | ORAL | Status: DC
  Administered 2024-07-20 – 2024-07-27 (×19): 1 via ORAL
  Filled 2024-07-20 (×15): qty 1

## 2024-07-20 MED ORDER — PREDNISONE 50 MG PO TABS
50.0000 mg | ORAL_TABLET | Freq: Every day | ORAL | Status: DC
  Administered 2024-07-20: 50 mg via ORAL
  Filled 2024-07-20: qty 1

## 2024-07-20 MED ORDER — SPIRONOLACTONE 25 MG PO TABS
25.0000 mg | ORAL_TABLET | Freq: Every day | ORAL | Status: DC
  Administered 2024-07-20 – 2024-07-27 (×11): 25 mg via ORAL
  Filled 2024-07-20 (×9): qty 1

## 2024-07-20 MED ORDER — INSULIN ASPART 100 UNIT/ML IJ SOLN
0.0000 [IU] | Freq: Three times a day (TID) | INTRAMUSCULAR | Status: DC
  Administered 2024-07-20: 9 [IU] via SUBCUTANEOUS
  Administered 2024-07-20 (×2): 5 [IU] via SUBCUTANEOUS
  Administered 2024-07-21 – 2024-07-23 (×3): 1 [IU] via SUBCUTANEOUS
  Administered 2024-07-23: 3 [IU] via SUBCUTANEOUS
  Administered 2024-07-24 (×4): 2 [IU] via SUBCUTANEOUS
  Administered 2024-07-25 (×2): 3 [IU] via SUBCUTANEOUS
  Administered 2024-07-25 (×2): 1 [IU] via SUBCUTANEOUS
  Administered 2024-07-26 (×2): 5 [IU] via SUBCUTANEOUS
  Administered 2024-07-26 (×2): 2 [IU] via SUBCUTANEOUS
  Administered 2024-07-27: 3 [IU] via SUBCUTANEOUS
  Filled 2024-07-20 (×12): qty 1

## 2024-07-20 MED ORDER — ALBUTEROL SULFATE HFA 108 (90 BASE) MCG/ACT IN AERS: 2.0000 | INHALATION_SPRAY | RESPIRATORY_TRACT | Status: DC | PRN

## 2024-07-20 MED ORDER — ENOXAPARIN SODIUM 40 MG/0.4ML IJ SOSY
40.0000 mg | PREFILLED_SYRINGE | INTRAMUSCULAR | Status: DC
  Administered 2024-07-20 – 2024-07-27 (×11): 40 mg via SUBCUTANEOUS
  Filled 2024-07-20 (×8): qty 0.4

## 2024-07-20 MED ORDER — DM-GUAIFENESIN ER 30-600 MG PO TB12
1.0000 | ORAL_TABLET | Freq: Two times a day (BID) | ORAL | Status: DC | PRN
  Administered 2024-07-20 – 2024-07-24 (×6): 1 via ORAL
  Filled 2024-07-20 (×6): qty 1

## 2024-07-20 MED ORDER — RIMEGEPANT SULFATE 75 MG PO TBDP: 1.0000 | ORAL_TABLET | Freq: Every day | ORAL | Status: DC | PRN

## 2024-07-20 MED ORDER — ONDANSETRON HCL 4 MG/2ML IJ SOLN
4.0000 mg | Freq: Three times a day (TID) | INTRAMUSCULAR | Status: DC | PRN
  Administered 2024-07-24 – 2024-07-26 (×4): 4 mg via INTRAVENOUS
  Filled 2024-07-20 (×3): qty 2

## 2024-07-20 MED ORDER — CARVEDILOL 6.25 MG PO TABS
12.5000 mg | ORAL_TABLET | Freq: Two times a day (BID) | ORAL | Status: DC
  Administered 2024-07-20 – 2024-07-27 (×17): 12.5 mg via ORAL
  Filled 2024-07-20: qty 2
  Filled 2024-07-20: qty 4
  Filled 2024-07-20 (×2): qty 2
  Filled 2024-07-20: qty 4
  Filled 2024-07-20 (×4): qty 2
  Filled 2024-07-20 (×3): qty 4
  Filled 2024-07-20: qty 2
  Filled 2024-07-20: qty 4
  Filled 2024-07-20: qty 2
  Filled 2024-07-20: qty 4
  Filled 2024-07-20 (×7): qty 2

## 2024-07-20 MED ORDER — AMLODIPINE BESYLATE 5 MG PO TABS
5.0000 mg | ORAL_TABLET | Freq: Every day | ORAL | Status: DC
  Administered 2024-07-20 – 2024-07-26 (×5): 5 mg via ORAL
  Filled 2024-07-20 (×4): qty 1

## 2024-07-20 MED ORDER — LIDOCAINE 5 % EX PTCH
1.0000 | MEDICATED_PATCH | Freq: Every day | CUTANEOUS | Status: DC
  Administered 2024-07-20 – 2024-07-26 (×11): 1 via TRANSDERMAL
  Filled 2024-07-20 (×8): qty 1

## 2024-07-20 MED ORDER — EZETIMIBE 10 MG PO TABS
10.0000 mg | ORAL_TABLET | Freq: Every day | ORAL | Status: DC
  Administered 2024-07-20 – 2024-07-27 (×11): 10 mg via ORAL
  Filled 2024-07-20 (×8): qty 1

## 2024-07-20 MED ORDER — METHOCARBAMOL 500 MG PO TABS
500.0000 mg | ORAL_TABLET | Freq: Three times a day (TID) | ORAL | Status: DC | PRN
  Administered 2024-07-20 – 2024-07-27 (×22): 500 mg via ORAL
  Filled 2024-07-20 (×15): qty 1

## 2024-07-20 NOTE — Inpatient Diabetes Management (Addendum)
 Inpatient Diabetes Program Recommendations  AACE/ADA: New Consensus Statement on Inpatient Glycemic Control  Target Ranges:  Prepandial:   less than 140 mg/dL      Peak postprandial:   less than 180 mg/dL (1-2 hours)      Critically ill patients:  140 - 180 mg/dL    Latest Reference Range & Units 07/20/24 01:04 07/20/24 07:19  Glucose-Capillary 70 - 99 mg/dL 725 (H) 722 (H)   Review of Glycemic Control  Diabetes history: DM2 Outpatient Diabetes medications: Basaglar  70 units at bedtime, Jardiance  25 mg daily, Novolog  10 units TID with meals, Ozempic  2 mg Qweek, Dexcom G7 CGM Current orders for Inpatient glycemic control: Semglee  50 units at bedtime, Novolog  0-9 units TID with meals, Novolog  0-5 units at bedtime; Prednisone  50 mg QAM  Inpatient Diabetes Program Recommendations:    Insulin : If steroids are continued and CBGs remain consistently over 180 mg/dl, please consider ordering Novolog  4 units TID with meals for meal coverage if patient eats at least 50% of meals.  NOTE: Admitted with lumbar spinal stenosis. In reviewing chart, noted patient sees [facility] Endocrinology and was seen by Dr. Damian on 05/25/24 and asked to continue Basaglar  70 at bedtime, continue Jardiance  25 every day, adjust Novolog  to 10 TID, Ozempic  to stop and start Mounjaro  7.5 mg Qweek.  Thanks, Earnie Gainer, RN, MSN, CDCES Diabetes Coordinator Inpatient Diabetes Program [phone] (Team Pager from 8am to 5pm)

## 2024-07-20 NOTE — Plan of Care (Signed)

## 2024-07-20 NOTE — Progress Notes (Signed)
 PROGRESS NOTE    Sarah Duffy  FMW:[id number] DOB: 1971/05/18 DOA: 07/19/2024 PCP: [name], Krichna, MD  Chief Complaint  Patient presents with   Back Pain    Hospital Course:  Sarah Duffy is a 53 y.o. female with medical history significant of chronic lower back pain, HLD, MD, CAD, sCHF with EF 49%, CKD-3a, migraine, depression with anxiety presented with worsening lower back pain, radiating to posterior legs asso with leg weakness and gait instability, no bladder/bowel incontinence, had falls. Has chronic LBP since March, follows up at [facility] pain clinic and getting steroid injections (ESI) every 3 months, recent one delayed > 1 month.   ED physician discussed with Dr. Katrina of neurosurgery, MRI shows moderate to severe canal stenosis with severe subarticular recess stenosis at L3-4, L4-5, L5-S1. Admitted for pain management and PT  Subjective: Seen at bedside, new to me today.  States symptoms slightly better today, some improvement with oral opioids.  Patient requiring assistance to move and symptoms moderately controlled with po opioids. PT recommends SNF, if patient improves will be able to go home therapy (lives alone at home)   Objective: Vitals:   07/20/24 0035 07/20/24 0100 07/20/24 0413 07/20/24 0720  BP: (!) 145/78 126/66 114/67 130/79  Pulse: 79 76 78 79  Resp: 15 16 20 17   Temp: 97.9 F (36.6 C) 98.2 F (36.8 C) 97.8 F (36.6 C) 97.9 F (36.6 C)  TempSrc: Oral Oral Oral Oral  SpO2: 98% 100% 99% 99%  Weight:      Height:       No intake or output data in the 24 hours ending 07/20/24 0835 Filed Weights   07/19/24 1809  Weight: 86.2 kg    Examination: General: Not in acute distress Cardiac: S1/S2, RRR, no gallops or rubs Respiratory: No rales, wheezing, rhonchi or rubs GI: Soft, nondistended, nontender, no rebound pain, no organomegaly, BS present GU: No hematuria Ext: has trace leg edema bilaterally. 1+DP/PT pulse bilaterally. Musculoskeletal: No  midline tenderness Skin: No rashes.  Neuro: Alert, oriented X3, cranial nerves II-XII grossly intact, moves all extremities normally. Muscle strength 4/5 in both legs, and 5/5 in arms. Sensation to light touch intact.   Assessment & Plan:  Principal Problem:   Lumbar spinal stenosis Active Problems:   Fall at home, initial encounter   Coronary artery disease   Hypertension   Chronic combined systolic and diastolic CHF (congestive heart failure) (HCC)   Hyperlipidemia LDL goal <70   Type II diabetes mellitus with renal manifestations (HCC)   Asthma, well controlled   Chronic kidney disease, stage 3a (HCC)   Chronic migraine w/o aura, not intractable, w/o stat migr   Overweight (BMI 25.0-29.9)  Lumbar spinal stenosis - Patient has severe pain, leg weakness and gait instability, lives alone at home - MRI of lumbar spine showed moderate to severe canal stenosis with subarticular recess stenosis in L3-L4, L4-L5, L5-S1 levels - ED physician discussed with Dr. Katrina of neurosurgery, patient is not a candidate for surgical treatment - As needed IV morphine , Percocet, Tylenol , Lidoderm  patch, Robaxin  prn, Gabapentin  home dose - Prednisone  50 mg daily x 5 days - PT/OT rec SNF - Consult TOC for SNF vs Home health - Has appt with [facility] 08/13   Fall at home, initial encounter: Reports head injury. - Fall precaution - PT/OT rec SNF - CT Head negative   Coronary artery disease: No chest pain -Continue aspirin , Lipitor, Zetia  - As needed nitroglycerin    Hypertension -IV hydralazine  as needed -  Patient is on spironolactone , amlodipine  and Entresto    Chronic combined systolic and diastolic CHF (congestive heart failure) (HCC): 2D echo on 04/02/2024 showed EF of 49% with grade 1 diastolic dysfunction.  Patient has trace leg edema, no SOB, no JVD, CHF seem to be compensated. -Continue home spironolactone , Entresto , Coreg    Hyperlipidemia LDL goal <70 -Lipitor, Zetia    Type II  diabetes mellitus with renal manifestations [facility]):  Recent A1c 9.3, poorly controlled.  Patient taking NovoLog , Jardiance , Ozempic  and glargine insulin  70 unit nightly - SSI, add Novolog  8u TID with meals - Inc Glargine insulin  60 units daily   Asthma, well controlled: Stable - as needed Mucinex    Chronic kidney disease, stage 3a (HCC) - Cr baseline 1.1-1.2   Chronic migraine w/o aura, not intractable, w/o stat migraine:  - Patient is on Emgality  injection every month for prophylaxis.   Overweight (BMI 25.0-29.9): Body weight 86.2 kg, BMI 28.04 - Encourage losing weight - Exercise and healthy diet  DVT prophylaxis: SQ Lovenox    Code Status: Full Code Disposition:  SNF vs home with Home health  Consultants:  None  Procedures:  None  Antimicrobials:  Anti-infectives (From admission, onward)    None       Data Reviewed: I have personally reviewed following labs and imaging studies CBC: Recent Labs  Lab 07/20/24 0040  WBC 6.2  HGB 10.1*  HCT 30.5*  MCV 87.1  PLT 200   Basic Metabolic Panel: Recent Labs  Lab 07/20/24 0040  NA 139  K 5.0  CL 107  CO2 23  GLUCOSE 286*  BUN 34*  CREATININE 1.28*  CALCIUM  9.1   GFR: Estimated Creatinine Clearance: 58.9 mL/min (A) (by C-G formula based on SCr of 1.28 mg/dL (H)). Liver Function Tests: No results for input(s): AST, ALT, ALKPHOS, BILITOT, PROT, ALBUMIN in the last 168 hours. CBG: Recent Labs  Lab 07/20/24 0104 07/20/24 0719  GLUCAP 274* 277*    No results found for this or any previous visit (from the past 240 hours).   Radiology Studies: CT HEAD WO CONTRAST ( ) Result Date: 07/20/2024 CLINICAL DATA:  Head trauma, moderate-severe Fall and head injury x Saturday. EXAM: CT HEAD WITHOUT CONTRAST TECHNIQUE: Contiguous axial images were obtained from the base of the skull through the vertex without intravenous contrast. RADIATION DOSE REDUCTION: This exam was performed according to the  departmental dose-optimization program which includes automated exposure control, adjustment of the mA and/or kV according to patient size and/or use of iterative reconstruction technique. COMPARISON:  MRI head 01/02/2024, CT head 09/19/2023 FINDINGS: Brain: No evidence of large-territorial acute infarction. No parenchymal hemorrhage. No mass lesion. No extra-axial collection. No mass effect or midline shift. No hydrocephalus. Basilar cisterns are patent. Vascular: No hyperdense vessel. Atherosclerotic calcifications are present within the cavernous internal carotid arteries. Skull: No acute fracture or focal lesion. Sinuses/Orbits: Paranasal sinuses and mastoid air cells are clear. The orbits are unremarkable. Other: None. IMPRESSION: No acute intracranial abnormality. Electronically Signed   By: Morgane  Naveau M.D.   On: 07/20/2024 01:32   MR LUMBAR SPINE WO CONTRAST Result Date: 07/19/2024 CLINICAL DATA:  Low back pain, cauda equina syndrome suspected EXAM: MRI LUMBAR SPINE WITHOUT CONTRAST TECHNIQUE: Multiplanar, multisequence MR imaging of the lumbar spine was performed. No intravenous contrast was administered. COMPARISON:  None Available. FINDINGS: Segmentation: Standard. Alignment:  No substantial sagittal subluxation. Vertebrae:  No fracture, evidence of discitis, or bone lesion. Conus medullaris and cauda equina: Conus extends to the L1-L2 level. Conus and cauda  equina appear normal. Paraspinal and other soft tissues: Unremarkable. Disc levels: T12-L1: No significant disc protrusion, foraminal stenosis, or canal stenosis. L1-L2: No significant disc protrusion, foraminal stenosis, or canal stenosis. L2-L3: Facet arthropathy.  No significant stenosis. L3-L4: Disc bulging, ligamentum flavum thickening and facet arthropathy with superimposed right paracentral disc protrusion. Prominent dorsal epidural fat. Resulting moderate to severe canal stenosis with severe right and moderate left subarticular recess  stenosis. Mild right foraminal stenosis. L4-L5: Posterior disc bulge with ligamentum flavum thickening and bilateral facet arthropathy. Resulting moderate to severe canal stenosis with severe bilateral subarticular recess stenosis. Mild bilateral foraminal stenosis. L5-S1: Posterior disc bulge with superimposed central disc protrusion with annular fissure. Resulting moderate canal stenosis and moderate to severe bilateral subarticular recess stenosis. Moderate left and mild right foraminal stenosis. IMPRESSION: 1. At L3-L4, moderate to severe canal stenosis with severe right and moderate left subarticular recess stenosis. 2. At L4-L5, moderate to severe canal stenosis with severe bilateral subarticular recess stenosis. 3. At L5-S1, moderate canal stenosis with moderate to severe bilateral subarticular recess stenosis. Moderate left and mild right foraminal stenosis. Electronically Signed   By: Gilmore GORMAN Molt M.D.   On: 07/19/2024 22:40    Scheduled Meds:  amLODipine   5 mg Oral QHS   aspirin  EC  81 mg Oral QHS   atorvastatin   80 mg Oral Daily   carvedilol   12.5 mg Oral BID WC   enoxaparin  (LOVENOX ) injection  40 mg Subcutaneous Q24H   ezetimibe   10 mg Oral Daily   gabapentin   800 mg Oral TID   insulin  aspart  0-5 Units Subcutaneous QHS   insulin  aspart  0-9 Units Subcutaneous TID WC   insulin  glargine-yfgn  50 Units Subcutaneous QHS   lidocaine   1 patch Transdermal QHS   predniSONE   50 mg Oral Q breakfast   sacubitril -valsartan   1 tablet Oral BID   spironolactone   25 mg Oral Daily   Continuous Infusions:   LOS: 0 days  MDM: Patient is high risk for one or more organ failure.  They necessitate ongoing hospitalization for continued IV therapies and subsequent lab monitoring. Total time spent interpreting labs and vitals, reviewing the medical record, coordinating care amongst consultants and care team members, directly assessing and discussing care with the patient and/or family: 55  min Laree Lock, MD [facility]  To contact the attending physician between 7A-7P please use Epic Chat. To contact the covering physician during after hours 7P-7A, please review Amion.  07/20/2024, 8:35 AM   *This document has been created with the assistance of dictation software. Please excuse typographical errors. *

## 2024-07-20 NOTE — Telephone Encounter (Signed)
 Pt left message on the nurse line. I have an appt next week.  Hoping it is for an injection.   I attempted to call pt..  Appt on 8/13 is a clinic appt, not a procedure appt.

## 2024-07-20 NOTE — Evaluation (Signed)
 Physical Therapy Evaluation Patient Details Name: Sarah Duffy MRN: [id number] DOB: 22-Oct-1971 Today's Date: 07/20/2024  History of Present Illness  Pt is a 53 y/o F admitted on 07/19/24 after presenting with c/o worsening lower back pain, reports 2 falls this past week. Lumbar MRI showed moderate to severe canal stenosis with subarticular recess stenosis in L3-L4, L4-L5, L5-S1 levels. Neurosurgery consulted; pt not a candidate for surgical treatment. PMH: chronic lower back pain, HLD, MD, CAD, sCHF, CKD3A, migraine, depression, anxiety  Clinical Impression  Pt seen for PT evaluation with pt agreeable to tx. Pt received sitting EOB reporting pain is much improved but still reporting 8/10 low back pain. Pt reports prior to admission she was independent without AD but has started to furniture walk & has had 2 falls prior to admission. Pt lives alone & has 6 steps without rails to enter, is still driving. On this date, pt requires mod fade to min assist for sit>stand from elevated EOB, min assist to ambulate into hallway with RW. Pt presents with BLE hip weakness impairing gait (decreased hip flexion during swing phase, decreased dorsiflexion during swing phase). At this time, pt remains a high fall risk & is unsafe to d/c home alone. Recommend ongoing acute PT services. Pt would intially benefit from post acute rehab <3 hours therapy/day upon d/c but hopeful pt can progress & d/c home with HHPT services.        If plan is discharge home, recommend the following: A little help with bathing/dressing/bathroom;Assistance with cooking/housework;A lot of help with walking and/or transfers;Direct supervision/assist for medications management;Assist for transportation;Help with stairs or ramp for entrance   Can travel by private vehicle   Yes    Equipment Recommendations Rolling walker (2 wheels);BSC/3in1  Recommendations for Other Services       Functional Status Assessment Patient has had a recent  decline in their functional status and demonstrates the ability to make significant improvements in function in a reasonable and predictable amount of time.     Precautions / Restrictions Precautions Precautions: Fall Restrictions Weight Bearing Restrictions Per Provider Order: No      Mobility  Bed Mobility               General bed mobility comments: not tested, pt received sitting EOB, left sitting in recliner    Transfers Overall transfer level: Needs assistance Equipment used: Rolling walker (2 wheels) Transfers: Sit to/from Stand Sit to Stand: From elevated surface, Mod assist, Min assist           General transfer comment: sit>stand from elevated EOB with mod assist fade to min assist, education re: hand placement to push to standing with RW with good return demo    Ambulation/Gait Ambulation/Gait assistance: Min assist Gait Distance (Feet): 55 Feet Assistive device: Rolling walker (2 wheels) Gait Pattern/deviations: Step-through pattern, Decreased step length - right, Decreased step length - left, Decreased dorsiflexion - right, Decreased dorsiflexion - left, Decreased stride length Gait velocity: decreased     General Gait Details: Education re: need to ambulatewithin base of AD & upright/forward vs downward gaze, cuing for turning technique  Stairs            Wheelchair Mobility     Tilt Bed    Modified Rankin (Stroke Patients Only)       Balance Overall balance assessment: Needs assistance   Sitting balance-Leahy Scale: Good     Standing balance support: Reliant on assistive device for balance, Bilateral upper extremity supported, During functional  activity Standing balance-Leahy Scale: Fair                               Pertinent Vitals/Pain Pain Assessment Pain Assessment: 0-10 Pain Score: 8  Pain Location: low back Pain Descriptors / Indicators: Discomfort Pain Intervention(s): Monitored during session, Limited  activity within patient's tolerance    Home Living Family/patient expects to be discharged to:: Private residence Living Arrangements: Alone Available Help at Discharge: Family;Available PRN/intermittently Type of Home: House Home Access: Stairs to enter Entrance Stairs-Rails: None Entrance Stairs-Number of Steps: 6   Home Layout: One level        Prior Function Prior Level of Function : Independent/Modified Independent;Driving             Mobility Comments: Reports 1-2 falls in the past 6 months, most recently was furniture walking, was pulled out of work in March. ADLs Comments: independent     Extremity/Trunk Assessment   Upper Extremity Assessment Upper Extremity Assessment: Overall WFL for tasks assessed    Lower Extremity Assessment Lower Extremity Assessment: RLE deficits/detail;LLE deficits/detail (BLE sensation to light touch & proprioception intact) RLE Deficits / Details: 2/5 hip flexion in sitting, 2/5 dorsiflexion LLE Deficits / Details: 2/5 hip flexion in sitting,2/5 dorsiflexion       Communication   Communication Communication: No apparent difficulties    Cognition Arousal: Alert Behavior During Therapy: WFL for tasks assessed/performed   PT - Cognitive impairments: No apparent impairments                         Following commands: Intact       Cueing Cueing Techniques: Verbal cues     General Comments      Exercises     Assessment/Plan    PT Assessment Patient needs continued PT services  PT Problem List Decreased balance;Decreased strength;Decreased mobility;Decreased activity tolerance;Pain;Decreased knowledge of use of DME       PT Treatment Interventions DME instruction;Balance training;Gait training;Neuromuscular re-education;Stair training;Functional mobility training;Therapeutic activities;Therapeutic exercise;Patient/family education    PT Goals (Current goals can be found in the Care Plan section)  Acute  Rehab PT Goals Patient Stated Goal: get better PT Goal Formulation: With patient Time For Goal Achievement: 08/03/24 Potential to Achieve Goals: Good    Frequency Min 3X/week     Co-evaluation               AM-PAC PT 6 Clicks Mobility  Outcome Measure Help needed turning from your back to your side while in a flat bed without using bedrails?: A Little Help needed moving from lying on your back to sitting on the side of a flat bed without using bedrails?: A Little Help needed moving to and from a bed to a chair (including a wheelchair)?: A Little Help needed standing up from a chair using your arms (e.g., wheelchair or bedside chair)?: A Lot Help needed to walk in hospital room?: A Little Help needed climbing 3-5 steps with a railing? : A Lot 6 Click Score: 16    End of Session   Activity Tolerance: Patient tolerated treatment well Patient left: in chair;with call bell/phone within reach;with chair alarm set Nurse Communication: Mobility status PT Visit Diagnosis: Muscle weakness (generalized) (M62.81);Other abnormalities of gait and mobility (R26.89);Difficulty in walking, not elsewhere classified (R26.2);Pain Pain - part of body:  (low back)    Time: 8862-8799 PT Time Calculation (min) (ACUTE ONLY): 23  min   Charges:   PT Evaluation $PT Eval Low Complexity: 1 Low   PT General Charges $$ ACUTE PT VISIT: 1 Visit         Richerd Pinal, PT, DPT 07/20/24, 1:14 PM   Richerd CHRISTELLA Pinal 07/20/2024, 1:11 PM

## 2024-07-20 NOTE — Evaluation (Signed)
 Occupational Therapy Evaluation Patient Details Name: Sarah Duffy MRN: [id number] DOB: [birth year] Today's Date: 07/20/2024   History of Present Illness   Pt is a 53 y/o F admitted on 07/19/24 after presenting with c/o worsening lower back pain, reports 2 falls this past week. Lumbar MRI showed moderate to severe canal stenosis with subarticular recess stenosis in L3-L4, L4-L5, L5-S1 levels. Neurosurgery consulted; pt not a candidate for surgical treatment. PMH: chronic lower back pain, HLD, MD, CAD, sCHF, CKD3A, migraine, depression, anxiety     Clinical Impressions Chart reviewed, pt greeted in chair, agreeable to OT evaluation. PTA pt is MOD I- in ADL/IADL, amb with no AD. She reports she has had to furniture walk recently, increased difficulties with ADLs. Pt presents with deficits in strength, endurance, activity tolerance, balance, affecting safe and optimal ADL completion. STS completed with MIN A from cair, MAX A from regular toilet height. Pt amb with CGA-MIN A with RW, MIN A without AD. MAX A required for LB dressing. Pt is performing ADL below PLOF, will benefit from acute OT to address deficits and to facilitate optimal ADL performance. Pt is left as received, all needs met. OT will follow.      If plan is discharge home, recommend the following:   A little help with walking and/or transfers;A little help with bathing/dressing/bathroom;Help with stairs or ramp for entrance;Assistance with cooking/housework     Functional Status Assessment   Patient has had a recent decline in their functional status and demonstrates the ability to make significant improvements in function in a reasonable and predictable amount of time.     Equipment Recommendations   BSC/3in1;Tub/shower seat     Recommendations for Other Services         Precautions/Restrictions   Precautions Precautions: Fall Recall of Precautions/Restrictions: Intact Restrictions Weight Bearing Restrictions  Per Provider Order: No     Mobility Bed Mobility               General bed mobility comments: NT in recliner pre/post session    Transfers Overall transfer level: Needs assistance Equipment used: Rolling walker (2 wheels) Transfers: Sit to/from Stand Sit to Stand: Mod assist, Max assist                  Balance Overall balance assessment: Needs assistance Sitting-balance support: Feet supported Sitting balance-Leahy Scale: Good     Standing balance support: Reliant on assistive device for balance, Bilateral upper extremity supported, During functional activity Standing balance-Leahy Scale: Fair                             ADL either performed or assessed with clinical judgement   ADL Overall ADL's : Needs assistance/impaired     Grooming: Wash/dry hands;Standing;Contact guard assist Grooming Details (indicate cue type and reason): with RW at sink level             Lower Body Dressing: Maximal assistance   Toilet Transfer: Maximal assistance Toilet Transfer Details (indicate cue type and reason): MIN A for stand>sit on regular toilet, MAX A with knees blocked to attempt STS; advised pt to use bsc over toilet Toileting- Clothing Manipulation and Hygiene: Supervision/safety;Sitting/lateral lean       Functional mobility during ADLs: Contact guard assist;Minimal assistance;Rolling walker (2 wheels) (approx 15' two attempts)       Vision Patient Visual Report: No change from baseline       Perception  Praxis         Pertinent Vitals/Pain Pain Assessment Pain Assessment: No/denies pain Pain Score: 8  Pain Location: low back Pain Descriptors / Indicators: Discomfort Pain Intervention(s): Monitored during session, Limited activity within patient's tolerance     Extremity/Trunk Assessment Upper Extremity Assessment Upper Extremity Assessment: Generalized weakness   Lower Extremity Assessment Lower Extremity Assessment:  Generalized weakness;Defer to PT evaluation RLE Deficits / Details: 2/5 hip flexion in sitting, 2/5 dorsiflexion LLE Deficits / Details: 2/5 hip flexion in sitting,2/5 dorsiflexion       Communication Communication Communication: Impaired Factors Affecting Communication: Reduced clarity of speech   Cognition Arousal: Alert Behavior During Therapy: WFL for tasks assessed/performed Cognition: No apparent impairments                               Following commands: Intact       Cueing  General Comments   Cueing Techniques: Verbal cues  vss throughout   Exercises Other Exercises Other Exercises: edu re: role of OT, role of rehab, discharge recommendations, safe ADL with DME/AE   Shoulder Instructions      Home Living Family/patient expects to be discharged to:: Private residence Living Arrangements: Alone Available Help at Discharge: Family;Available PRN/intermittently Type of Home: House Home Access: Stairs to enter [employer] of Steps: 6 Entrance Stairs-Rails: None Home Layout: One level     Bathroom Shower/Tub: Chief Strategy Officer: Standard     Home Equipment: None          Prior Functioning/Environment Prior Level of Function : Independent/Modified Independent;Driving;History of Falls (last six months)             Mobility Comments: pt reports she fell 1-2x in the last six months, furniture walks ADLs Comments: recently MOD I-I in ADL/IADL but she reports recent weakness has made it difficult to bathe, get dressed, get up from the toilet; she has had to stop working at [employer] because she is so unsteady    OT Problem List: Decreased strength;Impaired balance (sitting and/or standing);Decreased activity tolerance;Decreased knowledge of use of DME or AE   OT Treatment/Interventions: Self-care/ADL training;DME and/or AE instruction;Therapeutic activities;Balance training;Therapeutic exercise;Energy  conservation;Patient/family education      OT Goals(Current goals can be found in the care plan section)   Acute Rehab OT Goals Patient Stated Goal: improve function OT Goal Formulation: With patient Time For Goal Achievement: 08/03/24 Potential to Achieve Goals: Good ADL Goals Pt Will Perform Grooming: with modified independence;sitting;standing Pt Will Perform Lower Body Dressing: with modified independence;sitting/lateral leans;sit to/from stand;with adaptive equipment Pt Will Transfer to Toilet: with modified independence;ambulating;bedside commode Pt Will Perform Toileting - Clothing Manipulation and hygiene: with modified independence;sitting/lateral leans;sit to/from stand   OT Frequency:  Min 3X/week    Co-evaluation              AM-PAC OT 6 Clicks Daily Activity     Outcome Measure Help from another person eating meals?: None Help from another person taking care of personal grooming?: None Help from another person toileting, which includes using toliet, bedpan, or urinal?: A Lot Help from another person bathing (including washing, rinsing, drying)?: A Lot Help from another person to put on and taking off regular upper body clothing?: None Help from another person to put on and taking off regular lower body clothing?: A Lot 6 Click Score: 18   End of Session Equipment Utilized During Treatment: Rolling  walker (2 wheels) Nurse Communication: Mobility status;Patient requests pain meds  Activity Tolerance: Patient tolerated treatment well Patient left: in chair;with chair alarm set;with call bell/phone within reach  OT Visit Diagnosis: Other abnormalities of gait and mobility (R26.89);Muscle weakness (generalized) (M62.81)                Time: 8692-8677 OT Time Calculation (min): 15 min Charges:  OT General Charges $OT Visit: 1 Visit OT Evaluation $OT Eval Moderate Complexity: 1 Mod  Therisa Sheffield, OTD OTR/L  07/20/24, 3:36 PM

## 2024-07-20 NOTE — Consult Note (Signed)
 Consult requested by:  Dr. Jerelene   Consult requested for:  Lumbar and cervical stenosis  Primary Physician:  [name], Krichna, MD  History of Present Illness: 07/20/2024 [name] is here today with a chief complaint of worsening lower back pain and pain down her legs.  She has no bowel or bladder incontinence.  She has history of difficulty with her balance.  She has been seen by my partner in the outpatient clinic.  She presented with worsening of her severe pain.  She was started on steroids in the emergency department.  She is able to walk but has difficulty with her balance.  I have utilized the care everywhere function in epic to review the outside records available from external health systems.  Review of Systems:  A 10 point review of systems is negative, except for the pertinent positives and negatives detailed in the HPI.  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 02/2021 Echo: EF 35-40%, glob HK, GrI DD, nl RV fxn, mildly dil LA, mod MR; b. 06/2022 Echo: EF 50-55%, no rwma, GrII DD, nl RV fxn, RVSP , mildly dil LA, mild MR, AoV sclerosis.   CKD (chronic kidney disease), stage III (HCC)    Complication of anesthesia 03/23/2023   Possible gastroparesis based off food in stomach during EGD   Coronary artery disease    a. 04/2021 Cath: LM nl, LAD 70p/m, 40m, 80d, LCX 80m, OM1 40, OM2 99 (fills via collats from OM1), RCA 60d, RPDA 75.   COVID-19 2021   Hyperlipidemia LDL goal <70    Hypertension    Ischemic cardiomyopathy    a. 02/2021 Echo: EF 35-40%, glob HK, GrI DD; b. 06/2022 Echo: EF 50-55%.   Moderate mitral regurgitation    a. 02/2021 Echo: Mod MR.   MRSA infection within last 3 months 02/25/2016   Osteomyelitis of foot (HCC) 08/26/2016   Type II diabetes mellitus (HCC)     Past Surgical History: Past Surgical History:  Procedure Laterality Date    CHOLECYSTECTOMY  1999   COLONOSCOPY WITH PROPOFOL  N/A 03/25/2021   Procedure: COLONOSCOPY WITH PROPOFOL ;  Surgeon: Therisa Bi, MD;  Location: [facility] ENDOSCOPY;  Service: Gastroenterology;  Laterality: N/A;   COLONOSCOPY WITH PROPOFOL  N/A 04/17/2021   Procedure: COLONOSCOPY WITH PROPOFOL ;  Surgeon: Therisa Bi, MD;  Location: [facility] ENDOSCOPY;  Service: Gastroenterology;  Laterality: N/A;   ESOPHAGOGASTRODUODENOSCOPY (EGD) WITH PROPOFOL  N/A 03/25/2021   Procedure: ESOPHAGOGASTRODUODENOSCOPY (EGD) WITH PROPOFOL ;  Surgeon: Therisa Bi, MD;  Location: [facility] ENDOSCOPY;  Service: Gastroenterology;  Laterality: N/A;   ESOPHAGOGASTRODUODENOSCOPY (EGD) WITH PROPOFOL  N/A 04/17/2021   Procedure: ESOPHAGOGASTRODUODENOSCOPY (EGD) WITH PROPOFOL ;  Surgeon: Therisa Bi, MD;  Location: [facility] ENDOSCOPY;  Service: Gastroenterology;  Laterality: N/A;   ESOPHAGOGASTRODUODENOSCOPY (EGD) WITH PROPOFOL  N/A 03/23/2023   Procedure: ESOPHAGOGASTRODUODENOSCOPY (EGD) WITH PROPOFOL ;  Surgeon: Therisa Bi, MD;  Location: [facility] ENDOSCOPY;  Service: Gastroenterology;  Laterality: N/A;   RIGHT/LEFT HEART CATH AND CORONARY ANGIOGRAPHY Bilateral 04/14/2021   Procedure: RIGHT/LEFT HEART CATH AND CORONARY ANGIOGRAPHY;  Surgeon: Mady Bruckner, MD;  Location: [facility] INVASIVE CV LAB;  Service: Cardiovascular;  Laterality: Bilateral;   TOE SURGERY Left 02/07/2016   Pinky Toe    Allergies: Allergies as of 07/19/2024   (No Known Allergies)    Medications: Current Meds  Medication Sig   albuterol  (VENTOLIN  HFA) 108 (90 Base) MCG/ACT inhaler INHALE 2 PUFFS BY MOUTH EVERY 4 HOURS  AS NEEDED FOR WHEEZE OR FOR SHORTNESS OF BREATH   amLODipine  (NORVASC ) 5 MG tablet Take 1 tablet (5 mg total) by mouth daily.   aspirin  EC 81 MG tablet Take by mouth.   atorvastatin  (LIPITOR) 80 MG tablet Take 1 tablet (80 mg total) by mouth daily.   carvedilol  (COREG ) 12.5 MG tablet TAKE 1 TABLET BY MOUTH 2 TIMES DAILY.   ENTRESTO  24-26 MG Take 1 tablet by mouth 2 (two)  times daily.   ezetimibe  (ZETIA ) 10 MG tablet Take 1 tablet (10 mg total) by mouth daily.   gabapentin  (NEURONTIN ) 800 MG tablet Take 800 mg by mouth 3 (three) times daily.   Galcanezumab -gnlm (EMGALITY ) 120 MG/ML SOSY Inject 1 mL into the skin every 30 (thirty) days.   insulin  aspart (NOVOLOG  FLEXPEN) 100 UNIT/ML FlexPen Max daily 30 units (Patient taking differently: 10 Units 3 (three) times daily with meals. Max daily 30 units)   Insulin  Glargine (BASAGLAR  KWIKPEN) 100 UNIT/ML Inject 70 Units into the skin daily.   ipratropium (ATROVENT ) 0.03 % nasal spray Place 2 sprays into both nostrils 2 (two) times daily.   JARDIANCE  25 MG TABS tablet Take 1 tablet (25 mg total) by mouth daily.   levocetirizine (XYZAL ) 5 MG tablet Take 1 tablet (5 mg total) by mouth in the morning.   linaclotide  (LINZESS ) 290 MCG CAPS capsule Take 1 capsule (290 mcg total) by mouth daily before breakfast.   nitroGLYCERIN  (NITROSTAT ) 0.4 MG SL tablet PLACE 1 TABLET UNDER THE TONGUE EVERY 5 MINUTES AS NEEDED FOR CHEST PAIN.   OZEMPIC , 2 MG/DOSE, 8 MG/3ML SOPN Inject 2 mg into the skin once a week.   Rimegepant Sulfate  (NURTEC) 75 MG TBDP Take 1 tablet (75 mg total) by mouth daily as needed. Max of every other day prn   spironolactone  (ALDACTONE ) 25 MG tablet Take 1 tablet (25 mg total) by mouth daily.    Social History: Social History   Tobacco Use   Smoking status: Never   Smokeless tobacco: Never  Vaping Use   Vaping status: Never Used  Substance Use Topics   Alcohol use: No    Alcohol/week: 0.0 standard drinks of alcohol   Drug use: No    Family Medical History: Family History  Problem Relation Age of Onset   Diabetes Mother    Ulcers Mother    Heart disease Father    AAA (abdominal aortic aneurysm) Father    Diabetes Father    Hypertension Father    Stroke Father    Alzheimer's disease Father    Heart attack Sister    Seizures Brother    Diabetes Maternal Grandmother    Breast cancer Maternal  Grandmother     Physical Examination: Vitals:   07/20/24 0720 07/20/24 1517  BP: 130/79 113/87  Pulse: 79 70  Resp: 17 18  Temp: 97.9 F (36.6 C) 98.2 F (36.8 C)  SpO2: 99% 100%    General: Patient is in no apparent distress. Attention to examination is appropriate.  Neck:   Supple.  Full range of motion.  Respiratory: Patient is breathing without any difficulty.   NEUROLOGICAL:     Awake, alert, oriented to person, place, and time.  Speech is clear and fluent.  Cranial Nerves: Pupils equal round and reactive to light.  Facial tone is symmetric.  Facial sensation is symmetric. Shoulder shrug is symmetric. Tongue protrusion is midline.  There is no pronator drift.  Strength: Side Biceps Triceps Deltoid Interossei Grip Wrist Ext. Wrist Flex.  R 4 4 4 5 4  4+ 5  L 4 4 4 5 4  4+ 5   Side Iliopsoas Quads Hamstring PF DF EHL  R 4- 5 5 5 5 5   L 4- 5 5 5 5 5    Bilateral upper and lower extremity sensation is intact to light touch.    No evidence of dysmetria noted.  Gait is untested.     Medical Decision Making  Imaging: MR L spine 07/19/2024 IMPRESSION: 1. At L3-L4, moderate to severe canal stenosis with severe right and moderate left subarticular recess stenosis. 2. At L4-L5, moderate to severe canal stenosis with severe bilateral subarticular recess stenosis. 3. At L5-S1, moderate canal stenosis with moderate to severe bilateral subarticular recess stenosis. Moderate left and mild right foraminal stenosis.     Electronically Signed   By: Gilmore GORMAN Molt M.D.   On: 07/19/2024 22:40  MR C spine 04/08/2024 IMPRESSION: 1. Moderate C5-6 central canal stenosis with posterior disc osteophyte complex mildly to mildly impressing on the midline ventral cord. Mild increased T2 signal within the right and left aspects of the cord at the C5-6 disc level and within the left aspect of the cord just inferior to this. This is favored to represent chronic myelomalacia.  Acute cord edema is felt less likely. 2. Additional multilevel degenerative disc and joint changes as above. 3. C5-6 moderate to severe right greater than left neuroforaminal stenosis. 4. C6-7 mild to moderate central canal stenosis. Severe left and moderate to severe right neuroforaminal stenosis. Moderate left and mild right lateral recess narrowing. 5. C4-5 moderate to severe right neuroforaminal stenosis. 6. Note is made that the right vertebral artery extends deep into the right C3-4 neural foramen. The left vertebral artery extends more moderately into the left C3-4 neural foramen. Although these are normal variants, care should be taken during any neuroforaminal intervention at this level.     Electronically Signed   By: Tanda Lyons M.D.   On: 04/13/2024 13:01  I have personally reviewed the images and agree with the above interpretation.  Assessment and Plan: Ms. Sarvis is a pleasant 53 y.o. female with cervical lumbar stenosis with neurogenic claudication as well as cervical myelopathy.  She has myelomalacia.  She is struggled with sugar control.  She is working on this with her outpatient physicians.  She comes in today with increased difficulty with walking.  She is having increasing pain in her legs.  She has tried steroids in the past for 4 hours without improvement.  She is not currently an appropriate candidate for surgical intervention given her poor extremity control.  Would wean off steroids given her lack of improvement and exacerbation of her blood sugars.  Continue to work with physical therapy.  We will further discuss surgical intervention with her as an outpatient.    I have communicated my recommendations to the requesting physician and coordinated care to facilitate these recommendations.     Braylee Bosher K. Clois MD, [facility] Neurosurgery

## 2024-07-20 NOTE — Plan of Care (Signed)
  Problem: Education: Goal: Ability to describe self-care measures that may prevent or decrease complications (Diabetes Survival Skills Education) will improve Outcome: Progressing   Problem: Skin Integrity: Goal: Risk for impaired skin integrity will decrease Outcome: Progressing   Problem: Pain Managment: Goal: General experience of comfort will improve and/or be controlled Outcome: Progressing   Problem: Safety: Goal: Ability to remain free from injury will improve Outcome: Progressing   Problem: Skin Integrity: Goal: Risk for impaired skin integrity will decrease Outcome: Progressing

## 2024-07-21 DIAGNOSIS — M48061 Spinal stenosis, lumbar region without neurogenic claudication: Secondary | ICD-10-CM | POA: Diagnosis not present

## 2024-07-21 DIAGNOSIS — M48062 Spinal stenosis, lumbar region with neurogenic claudication: Secondary | ICD-10-CM | POA: Diagnosis not present

## 2024-07-21 LAB — BASIC METABOLIC PANEL WITH GFR
Anion gap: 8 (ref 5–15)
BUN: 44 mg/dL — ABNORMAL HIGH (ref 6–20)
CO2: 25 mmol/L (ref 22–32)
Calcium: 9.5 mg/dL (ref 8.9–10.3)
Chloride: 105 mmol/L (ref 98–111)
Creatinine, Ser: 1.19 mg/dL — ABNORMAL HIGH (ref 0.44–1.00)
GFR, Estimated: 54 mL/min — ABNORMAL LOW (ref >60)
Glucose, Bld: 160 mg/dL — ABNORMAL HIGH (ref 70–99)
Potassium: 4.6 mmol/L (ref 3.5–5.1)
Sodium: 138 mmol/L (ref 135–145)

## 2024-07-21 LAB — GLUCOSE, CAPILLARY
Glucose-Capillary: 112 mg/dL — ABNORMAL HIGH (ref 70–99)
Glucose-Capillary: 95 mg/dL (ref 70–99)
Glucose-Capillary: 138 mg/dL — ABNORMAL HIGH (ref 70–99)
Glucose-Capillary: 220 mg/dL — ABNORMAL HIGH (ref 70–99)

## 2024-07-21 MED ORDER — TRAMADOL HCL 50 MG PO TABS
50.0000 mg | ORAL_TABLET | Freq: Four times a day (QID) | ORAL | Status: DC | PRN
  Administered 2024-07-21 – 2024-07-27 (×23): 50 mg via ORAL
  Filled 2024-07-21 (×15): qty 1

## 2024-07-21 NOTE — Progress Notes (Signed)
 Mobility Specialist - Progress Note   Pre-mobility: HR, BP, SpO2 During mobility: HR, BP, SpO2 Post-mobility: HR, BP, SPO2     07/21/24 1700  Mobility  Activity Ambulated with assistance;Stood at bedside;Dangled on edge of bed  Level of Assistance Contact guard assist, steadying assist  Assistive Device Front wheel walker  Distance Ambulated (ft) 160 ft  Range of Motion/Exercises Active  Activity Response Tolerated well  Mobility Referral Yes  Mobility visit 1 Mobility  Mobility Specialist Start Time (ACUTE ONLY) 1654  Mobility Specialist Stop Time (ACUTE ONLY) 1720  Mobility Specialist Time Calculation (min) (ACUTE ONLY) 26 min    [employer] Mobility Specialist 07/21/24, 5:21 PM

## 2024-07-21 NOTE — Progress Notes (Signed)
 PROGRESS NOTE    Sarah Duffy  FMW:[id number] DOB: 25-Jul-1971 DOA: 07/19/2024 PCP: [name], Krichna, MD  Chief Complaint  Patient presents with   Back Pain    Hospital Course:  Sarah Duffy is a 53 y.o. female with medical history significant of chronic lower back pain, HLD, MD, CAD, sCHF with EF 49%, CKD-3a, migraine, depression with anxiety presented with worsening lower back pain, radiating to posterior legs asso with leg weakness and gait instability, no bladder/bowel incontinence, had falls. Has chronic LBP since March, follows up at [facility] pain clinic and getting steroid injections (ESI) every 3 months, recent one delayed > 1 month.   ED physician discussed with Dr. Katrina of neurosurgery, MRI shows moderate to severe canal stenosis with severe subarticular recess stenosis at L3-4, L4-5, L5-S1. Admitted for pain management and PT  Subjective: Seen at bedside,  States symptoms slightly better today, continues to require assistance and won't be able to go home independently (lives alone) TOC working on SNF placement   Objective: Vitals:   07/20/24 2131 07/21/24 0416 07/21/24 0500 07/21/24 0812  BP: 115/63 106/62  120/65  Pulse: 75 78  74  Resp:  18  16  Temp:  98.7 F (37.1 C)  98.3 F (36.8 C)  TempSrc:  Oral  Oral  SpO2: 100% 98%  99%  Weight:   36.1 kg   Height:        Intake/Output Summary (Last 24 hours) at 07/21/2024 1525 Last data filed at 07/21/2024 1300 Gross per 24 hour  Intake 960 ml  Output --  Net 960 ml   Filed Weights   07/19/24 1809 07/21/24 0500  Weight: 86.2 kg 36.1 kg    Examination: General: Not in acute distress Cardiac: S1/S2, RRR, no gallops or rubs Respiratory: No rales, wheezing, rhonchi or rubs GI: Soft, nondistended, nontender, no rebound pain, no organomegaly, BS present GU: No hematuria Ext: has trace leg edema bilaterally. 1+DP/PT pulse bilaterally. Musculoskeletal: No midline tenderness Skin: No rashes.  Neuro: Alert, oriented  X3, cranial nerves II-XII grossly intact, moves all extremities normally. Muscle strength 4/5 in both legs, and 5/5 in arms. Sensation to light touch intact.   Assessment & Plan:  Principal Problem:   Lumbar spinal stenosis Active Problems:   Fall at home, initial encounter   Coronary artery disease   Hypertension   Chronic combined systolic and diastolic CHF (congestive heart failure) (HCC)   Hyperlipidemia LDL goal <70   Type II diabetes mellitus with renal manifestations (HCC)   Asthma, well controlled   Chronic kidney disease, stage 3a (HCC)   Chronic migraine w/o aura, not intractable, w/o stat migr   Overweight (BMI 25.0-29.9)   Cervical myelopathy (HCC)   Impaired gait and mobility  Lumbar spinal stenosis with neurogenic claudication - Patient has severe pain, leg weakness and gait instability, lives alone at home - MRI of lumbar spine showed moderate to severe canal stenosis with subarticular recess stenosis in L3-L4, L4-L5, L5-S1 levels - Seen by Neurosurgery, appreciate recs. Not an appropriate candidate for surgical intervention due to poor DM control - Multimodal pain control - Tylenol , Lidoderm  patch, Robaxin  prn, Gabapentin . Avoid NSAID's due to CKD. Minimize opioids - Tramadol  prn - Discontinue prednisone  - PT/OT rec SNF, pending placement - Has appt with [facility] 08/13 - Follow up outpatient for surgical intervention once HbA1c controlled   Fall at home, initial encounter: Reports head injury. - Fall precaution - PT/OT rec SNF - CT Head negative   Coronary artery  disease: No chest pain -Continue aspirin , Lipitor, Zetia  - As needed nitroglycerin    Hypertension -IV hydralazine  as needed - Patient is on spironolactone , amlodipine  and Entresto    Chronic combined systolic and diastolic CHF (congestive heart failure) (HCC): 2D echo on 04/02/2024 showed EF of 49% with grade 1 diastolic dysfunction.  Patient has trace leg edema, no SOB, no JVD, CHF seem to be  compensated. -Continue home spironolactone , Entresto , Coreg    Hyperlipidemia LDL goal <70 -Lipitor, Zetia    Type II diabetes mellitus with renal manifestations [facility]):  Recent A1c 9.3, poorly controlled (05/25).  Patient taking NovoLog , Jardiance , Ozempic  and glargine insulin  70 unit nightly - SSI, add Novolog  8u TID with meals - Inc Glargine insulin  60 units daily - Repeat HbA1c pending   Asthma, well controlled: Stable - as needed Mucinex    Chronic kidney disease, stage 3a (HCC) - Cr baseline 1.1-1.2   Chronic migraine w/o aura, not intractable, w/o stat migraine:  - Patient is on Emgality  injection every month for prophylaxis.   Overweight (BMI 25.0-29.9): Body weight 86.2 kg, BMI 28.04 - Encourage losing weight - Exercise and healthy diet  DVT prophylaxis: SQ Lovenox    Code Status: Full Code Disposition:  SNF  Consultants:  None  Procedures:  None  Antimicrobials:  Anti-infectives (From admission, onward)    None       Data Reviewed: I have personally reviewed following labs and imaging studies CBC: Recent Labs  Lab 07/20/24 0040  WBC 6.2  HGB 10.1*  HCT 30.5*  MCV 87.1  PLT 200   Basic Metabolic Panel: Recent Labs  Lab 07/20/24 0040 07/21/24 0211  NA 139 138  K 5.0 4.6  CL 107 105  CO2 23 25  GLUCOSE 286* 160*  BUN 34* 44*  CREATININE 1.28* 1.19*  CALCIUM  9.1 9.5   GFR: Estimated Creatinine Clearance: 30.8 mL/min (A) (by C-G formula based on SCr of 1.19 mg/dL (H)). Liver Function Tests: No results for input(s): AST, ALT, ALKPHOS, BILITOT, PROT, ALBUMIN in the last 168 hours. CBG: Recent Labs  Lab 07/20/24 1112 07/20/24 1613 07/20/24 2127 07/21/24 0811 07/21/24 1130  GLUCAP 358* 269* 268* 112* 95    No results found for this or any previous visit (from the past 240 hours).   Radiology Studies: CT HEAD WO CONTRAST ( ) Result Date: 07/20/2024 CLINICAL DATA:  Head trauma, moderate-severe Fall and head injury x  Saturday. EXAM: CT HEAD WITHOUT CONTRAST TECHNIQUE: Contiguous axial images were obtained from the base of the skull through the vertex without intravenous contrast. RADIATION DOSE REDUCTION: This exam was performed according to the departmental dose-optimization program which includes automated exposure control, adjustment of the mA and/or kV according to patient size and/or use of iterative reconstruction technique. COMPARISON:  MRI head 01/02/2024, CT head 09/19/2023 FINDINGS: Brain: No evidence of large-territorial acute infarction. No parenchymal hemorrhage. No mass lesion. No extra-axial collection. No mass effect or midline shift. No hydrocephalus. Basilar cisterns are patent. Vascular: No hyperdense vessel. Atherosclerotic calcifications are present within the cavernous internal carotid arteries. Skull: No acute fracture or focal lesion. Sinuses/Orbits: Paranasal sinuses and mastoid air cells are clear. The orbits are unremarkable. Other: None. IMPRESSION: No acute intracranial abnormality. Electronically Signed   By: Morgane  Naveau M.D.   On: 07/20/2024 01:32   MR LUMBAR SPINE WO CONTRAST Result Date: 07/19/2024 CLINICAL DATA:  Low back pain, cauda equina syndrome suspected EXAM: MRI LUMBAR SPINE WITHOUT CONTRAST TECHNIQUE: Multiplanar, multisequence MR imaging of the lumbar spine was performed. No intravenous contrast  was administered. COMPARISON:  None Available. FINDINGS: Segmentation: Standard. Alignment:  No substantial sagittal subluxation. Vertebrae:  No fracture, evidence of discitis, or bone lesion. Conus medullaris and cauda equina: Conus extends to the L1-L2 level. Conus and cauda equina appear normal. Paraspinal and other soft tissues: Unremarkable. Disc levels: T12-L1: No significant disc protrusion, foraminal stenosis, or canal stenosis. L1-L2: No significant disc protrusion, foraminal stenosis, or canal stenosis. L2-L3: Facet arthropathy.  No significant stenosis. L3-L4: Disc bulging,  ligamentum flavum thickening and facet arthropathy with superimposed right paracentral disc protrusion. Prominent dorsal epidural fat. Resulting moderate to severe canal stenosis with severe right and moderate left subarticular recess stenosis. Mild right foraminal stenosis. L4-L5: Posterior disc bulge with ligamentum flavum thickening and bilateral facet arthropathy. Resulting moderate to severe canal stenosis with severe bilateral subarticular recess stenosis. Mild bilateral foraminal stenosis. L5-S1: Posterior disc bulge with superimposed central disc protrusion with annular fissure. Resulting moderate canal stenosis and moderate to severe bilateral subarticular recess stenosis. Moderate left and mild right foraminal stenosis. IMPRESSION: 1. At L3-L4, moderate to severe canal stenosis with severe right and moderate left subarticular recess stenosis. 2. At L4-L5, moderate to severe canal stenosis with severe bilateral subarticular recess stenosis. 3. At L5-S1, moderate canal stenosis with moderate to severe bilateral subarticular recess stenosis. Moderate left and mild right foraminal stenosis. Electronically Signed   By: Gilmore GORMAN Molt M.D.   On: 07/19/2024 22:40    Scheduled Meds:  amLODipine   5 mg Oral QHS   aspirin  EC  81 mg Oral QHS   atorvastatin   80 mg Oral Daily   carvedilol   12.5 mg Oral BID WC   enoxaparin  (LOVENOX ) injection  40 mg Subcutaneous Q24H   ezetimibe   10 mg Oral Daily   gabapentin   800 mg Oral TID   insulin  aspart  0-5 Units Subcutaneous QHS   insulin  aspart  0-9 Units Subcutaneous TID WC   insulin  aspart  8 Units Subcutaneous TID with meals   insulin  glargine-yfgn  60 Units Subcutaneous QHS   lidocaine   1 patch Transdermal QHS   sacubitril -valsartan   1 tablet Oral BID   spironolactone   25 mg Oral Daily   Continuous Infusions:   LOS: 0 days  MDM: Patient is high risk for one or more organ failure.  They necessitate ongoing hospitalization for continued IV therapies  and subsequent lab monitoring. Total time spent interpreting labs and vitals, reviewing the medical record, coordinating care amongst consultants and care team members, directly assessing and discussing care with the patient and/or family: 35 min Laree Lock, MD [facility]  To contact the attending physician between 7A-7P please use Epic Chat. To contact the covering physician during after hours 7P-7A, please review Amion.  07/21/2024, 3:25 PM   *This document has been created with the assistance of dictation software. Please excuse typographical errors. *

## 2024-07-21 NOTE — TOC Progression Note (Addendum)
 Transition of Care [facility]) - Progression Note    Patient Details  Name: Sarah Duffy MRN: [id number] Date of Birth: 1971/06/18  Transition of Care [facility]) CM/SW Contact  Alvaro Louder, [place] Phone Number: 07/21/2024, 4:17 PM  Clinical Narrative:   Patient agreeable with PT and OT recommendation of SNF. LCSWA faxed out information to SNF's in [place], awaiting responses.   TOC to follow for discharge                   Expected Discharge Plan and Services                                               Social Drivers of Health (SDOH) Interventions SDOH Screenings   Food Insecurity: No Food Insecurity (07/20/2024)  Recent Concern: Food Insecurity - Food Insecurity Present (05/15/2024)   Received from [facility]  Housing: High Risk (07/20/2024)  Transportation Needs: No Transportation Needs (07/20/2024)  Recent Concern: Transportation Needs - Unmet Transportation Needs (05/15/2024)   Received from [facility]  Utilities: Not At Risk (07/20/2024)  Recent Concern: Utilities - At Risk (05/15/2024)   Received from [facility]  Alcohol Screen: Low Risk  (02/26/2023)  Depression (PHQ2-9): Low Risk  (07/05/2024)  Financial Resource Strain: High Risk (05/15/2024)   Received [facility]  Physical Activity: Insufficiently Active (05/15/2024)   Received from [facility]  Social Connections: Unknown (07/20/2024)  Recent Concern: Social Connections - Moderately Isolated (05/15/2024)   Received from [facility]  Stress: Stress Concern Present (05/15/2024)   Received from [facility]  Tobacco Use: Low Risk  (07/19/2024)  Health Literacy: Adequate Health Literacy (05/15/2024)   Received from [facility]    Readmission Risk Interventions     No data to display

## 2024-07-21 NOTE — Plan of Care (Signed)
  Problem: Education: Goal: Ability to describe self-care measures that may prevent or decrease complications (Diabetes Survival Skills Education) will improve Outcome: Progressing   Problem: Pain Managment: Goal: General experience of comfort will improve and/or be controlled Outcome: Progressing   Problem: Safety: Goal: Ability to remain free from injury will improve Outcome: Progressing   Problem: Skin Integrity: Goal: Risk for impaired skin integrity will decrease Outcome: Progressing

## 2024-07-21 NOTE — NC FL2 (Signed)
 [place]  MEDICAID [id number] LEVEL OF CARE FORM     IDENTIFICATION  Patient Name: Sarah Duffy Birthdate: 1970-12-15 Sex: female Admission Date (Current Location): 07/19/2024  [facility] and [place] Number:  Chiropodist and Address:  [facility], [address] [place], [place] [address]      Provider Number: [id number]  Attending Physician Name and Address:  Jerelene Critchley, MD  Relative Name and Phone Number:       Current Level of Care: Hospital Recommended Level of Care: Skilled Nursing Facility Prior Approval Number:    Date Approved/Denied:   PASRR Number: [id number]  Discharge Plan: SNF    Current Diagnoses: Patient Active Problem List   Diagnosis Date Noted   Chronic combined systolic and diastolic CHF (congestive heart failure) (HCC) 07/20/2024   Fall at home, initial encounter 07/20/2024   Type II diabetes mellitus with renal manifestations (HCC) 07/20/2024   Overweight (BMI 25.0-29.9) 07/20/2024   Cervical myelopathy (HCC) 07/20/2024   Impaired gait and mobility 07/20/2024   Lumbar spinal stenosis 07/19/2024   Chronic pain syndrome 07/05/2024   Dyslipidemia due to type 2 diabetes mellitus (HCC) 07/05/2024   Spondylosis, cervical, with myelopathy 04/18/2024   Hip weakness 04/03/2024   Gait instability 04/03/2024   Chronic anemia 03/14/2024   Gait abnormality 11/30/2023   Spinal stenosis of lumbar region 11/30/2023   Chronic kidney disease, stage 3a (HCC) 11/09/2023   Weakness of extremity 09/20/2023   Arthropathy of lumbar facet joint 07/29/2023   Low back pain 07/29/2023   Pain in finger of left hand 07/29/2023   Sacroiliac joint pain 07/29/2023   Contact dermatitis and eczema 06/08/2023   Dysphagia 03/23/2023   Asthma, well controlled 03/09/2023   Depression, major, in remission (HCC) 03/09/2023   Perennial allergic rhinitis with seasonal variation 03/09/2023   Type 2 diabetes mellitus with diabetic polyneuropathy,  with long-term current use of insulin  (HCC) 08/31/2022   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(HCC) 08/31/2022   Hypertension 06/11/2022   Ischemic cardiomyopathy 06/11/2022   Coronary artery disease 06/11/2022   Chronic systolic heart failure (HCC) 06/11/2022   Angina pectoris associated with type 2 diabetes mellitus (HCC) 01/05/2022   Cardiomyopathy (HCC) 04/14/2021   History of 2019 novel coronavirus disease (COVID-19) 06/23/2019   Chronic migraine w/o aura, not intractable, w/o stat migr 01/14/2018   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 associated with type 2 diabetes mellitus (HCC) 05/21/2016   Hyperlipidemia LDL goal <70 03/10/2016   MI (mitral incompetence) 02/25/2016   Insomnia 02/25/2016   Anxiety 09/04/2014    Orientation RESPIRATION BLADDER Height & Weight     Self, Time, Situation, Place  Normal Continent Weight: 79 lb 9.4 oz (36.1 kg) Height:  5' 9 (175.3 cm)  BEHAVIORAL SYMPTOMS/MOOD NEUROLOGICAL BOWEL NUTRITION STATUS      Continent Diet (Heart Healthy)  AMBULATORY STATUS COMMUNICATION OF NEEDS Skin   Limited Assist Verbally Normal                       Personal Care Assistance Level of Assistance  Feeding, Bathing, Dressing Bathing Assistance: Limited assistance Feeding assistance: Independent Dressing Assistance: Limited assistance     Functional Limitations Info             SPECIAL CARE FACTORS FREQUENCY  PT (By licensed PT), OT (By licensed OT)     PT Frequency: 5x/week OT Frequency: 5x/week            Contractures  Additional Factors Info  Code Status, Allergies Code Status Info: Full Allergies Info: Lumbar spinal stenosis, Impaired gait and mobility, Chronic bilateral low back pain with bilateral sciatica, Acute on chronic low back           Current Medications (07/21/2024):  This is the current hospital active medication list Current Facility-Administered Medications  Medication  Dose Route Frequency Provider Last Rate Last Admin   acetaminophen  (TYLENOL ) tablet 650 mg  650 mg Oral Q6H PRN Niu, Xilin, MD       albuterol  (PROVENTIL ) (2.5 MG/3ML) 0.083% nebulizer solution 2.5 mg  2.5 mg Nebulization Q4H PRN Dail Rankin RAMAN, RPH       amLODipine  (NORVASC ) tablet 5 mg  5 mg Oral QHS Niu, Xilin, MD   5 mg at 07/20/24 2132   aspirin  EC tablet 81 mg  81 mg Oral QHS Niu, Xilin, MD   81 mg at 07/20/24 2131   atorvastatin  (LIPITOR) tablet 80 mg  80 mg Oral Daily Niu, Xilin, MD   80 mg at 07/21/24 0831   carvedilol  (COREG ) tablet 12.5 mg  12.5 mg Oral BID WC Niu, Xilin, MD   12.5 mg at 07/21/24 9167   dextromethorphan -guaiFENesin  (MUCINEX  DM) 30-600 MG per 12 hr tablet 1 tablet  1 tablet Oral BID PRN Niu, Xilin, MD   1 tablet at 07/21/24 0831   enoxaparin  (LOVENOX ) injection 40 mg  40 mg Subcutaneous Q24H Niu, Xilin, MD   40 mg at 07/21/24 0831   ezetimibe  (ZETIA ) tablet 10 mg  10 mg Oral Daily Niu, Xilin, MD   10 mg at 07/21/24 9167   gabapentin  (NEURONTIN ) capsule 800 mg  800 mg Oral TID Niu, Xilin, MD   800 mg at 07/21/24 0831   hydrALAZINE  (APRESOLINE ) injection 5 mg  5 mg Intravenous Q2H PRN Niu, Xilin, MD       insulin  aspart (novoLOG ) injection 0-5 Units  0-5 Units Subcutaneous QHS Niu, Xilin, MD   3 Units at 07/20/24 2131   insulin  aspart (novoLOG ) injection 0-9 Units  0-9 Units Subcutaneous TID WC Niu, Xilin, MD   5 Units at 07/20/24 1727   insulin  aspart (novoLOG ) injection 8 Units  8 Units Subcutaneous TID with meals Ponnala, Shruthi, MD   8 Units at 07/21/24 1157   insulin  glargine-yfgn (SEMGLEE ) injection 60 Units  60 Units Subcutaneous QHS Ponnala, Shruthi, MD   60 Units at 07/20/24 2131   lidocaine  (LIDODERM ) 5 % 1 patch  1 patch Transdermal QHS Niu, Xilin, MD   1 patch at 07/20/24 2132   methocarbamol  (ROBAXIN ) tablet 500 mg  500 mg Oral Q8H PRN Niu, Xilin, MD   500 mg at 07/21/24 9167   nitroGLYCERIN  (NITROSTAT ) SL tablet 0.4 mg  0.4 mg Sublingual Q5 min PRN Niu,  Xilin, MD       ondansetron  (ZOFRAN ) injection 4 mg  4 mg Intravenous Q8H PRN Niu, Xilin, MD       sacubitril -valsartan  (ENTRESTO ) 24-26 mg per tablet  1 tablet Oral BID Niu, Xilin, MD   1 tablet at 07/21/24 0831   spironolactone  (ALDACTONE ) tablet 25 mg  25 mg Oral Daily Niu, Xilin, MD   25 mg at 07/21/24 0831   traMADol  (ULTRAM ) tablet 50 mg  50 mg Oral Q6H PRN Ponnala, Shruthi, MD   50 mg at 07/21/24 1340     Discharge Medications: Please see discharge summary for a list of discharge medications.  Relevant Imaging Results:  Relevant Lab Results:   Additional Information SSN [id number]  Mallerie Blok  [place], LCSW

## 2024-07-21 NOTE — Progress Notes (Signed)
 Physical Therapy Treatment Patient Details Name: Sarah Duffy MRN: [id number] DOB: [birth year] Today's Date: 07/21/2024   History of Present Illness Pt is a 53 y/o F admitted on 07/19/24 after presenting with c/o worsening lower back pain, reports 2 falls this past week. Lumbar MRI showed moderate to severe canal stenosis with subarticular recess stenosis in L3-L4, L4-L5, L5-S1 levels. Neurosurgery consulted; pt not a candidate for surgical treatment. PMH: chronic lower back pain, HLD, MD, CAD, sCHF, CKD3A, migraine, depression, anxiety    PT Comments  Pt demonstrated understanding of log roll technique and use of step-to stepping pattern to use as needed secondary to report of LLE weakness with use of the RW. Pt ambulated 50 ft with min A from PT for sequencing. Pt will benefit from continued PT to work towards returning to PLOF.     If plan is discharge home, recommend the following: A little help with bathing/dressing/bathroom;Assistance with cooking/housework;A lot of help with walking and/or transfers;Direct supervision/assist for medications management;Assist for transportation;Help with stairs or ramp for entrance   Can travel by private vehicle     Yes  Equipment Recommendations  Rolling walker (2 wheels);BSC/3in1    Recommendations for Other Services       Precautions / Restrictions Precautions Precautions: Fall Recall of Precautions/Restrictions: Intact Restrictions Weight Bearing Restrictions Per Provider Order: No     Mobility  Bed Mobility Overal bed mobility: Needs Assistance    Log roll training. Sequencing provided.          General bed mobility comments: Pt required verbal cuing for sequencing. Used bed rails and UEs to push up to sit at EOB    Transfers Overall transfer level: Needs assistance Equipment used: Rolling walker (2 wheels) Transfers: Sit to/from Stand, Bed to chair/wheelchair/BSC Sit to Stand: Mod assist, +2 physical assistance   Step pivot  transfers: Min assist       General transfer comment: Sit to stand from recliner required mod x 2. Pt demonstrated ability step transfer with min A for sequencing    Ambulation/Gait Ambulation/Gait assistance: Contact guard assist Gait Distance (Feet): 50 Feet Assistive device: Rolling walker (2 wheels) Gait Pattern/deviations: Step-to pattern, Decreased dorsiflexion - right, Decreased dorsiflexion - left, Knees buckling Gait velocity: decreased     General Gait Details: Education about use of step-to pattern to avoid LLE buckling. Pt reports LLE feels weaker than RLE. Mod cuing for step-to sequencing.   Stairs             Wheelchair Mobility     Tilt Bed    Modified Rankin (Stroke Patients Only)       Balance Overall balance assessment: Needs assistance Sitting-balance support: Feet supported Sitting balance-Leahy Scale: Good Sitting balance - Comments: Pt able to sit safety at EOB with both feet supported   Standing balance support: Reliant on assistive device for balance, During functional activity Standing balance-Leahy Scale: Fair Standing balance comment: Pt requiring RW for standing balance secondary to LE weakness                            Communication Communication Communication: No apparent difficulties  Cognition Arousal: Alert Behavior During Therapy: WFL for tasks assessed/performed   PT - Cognitive impairments: No apparent impairments                       PT - Cognition Comments: Pt is pleasant and agreeable to PT session Following commands: Intact  Cueing Cueing Techniques: Verbal cues, Visual cues  Exercises Other Exercises Other Exercises: Edu about log roll technique Other Exercises: standing marches    General Comments        Pertinent Vitals/Pain Pain Assessment Pain Assessment: No/denies pain Pain Score: 0-No pain    Home Living                          Prior Function             PT Goals (current goals can now be found in the care plan section) Acute Rehab PT Goals Patient Stated Goal: get better PT Goal Formulation: With patient Time For Goal Achievement: 08/03/24 Potential to Achieve Goals: Good Progress towards PT goals: Progressing toward goals    Frequency    Min 3X/week      PT Plan      Co-evaluation              AM-PAC PT 6 Clicks Mobility   Outcome Measure  Help needed turning from your back to your side while in a flat bed without using bedrails?: A Little Help needed moving from lying on your back to sitting on the side of a flat bed without using bedrails?: A Little Help needed moving to and from a bed to a chair (including a wheelchair)?: A Little Help needed standing up from a chair using your arms (e.g., wheelchair or bedside chair)?: A Lot Help needed to walk in hospital room?: A Little Help needed climbing 3-5 steps with a railing? : A Lot 6 Click Score: 16    End of Session Equipment Utilized During Treatment: Gait belt Activity Tolerance: Patient tolerated treatment well Patient left: in chair;with call bell/phone within reach;with chair alarm set Nurse Communication: Mobility status PT Visit Diagnosis: Muscle weakness (generalized) (M62.81);Other abnormalities of gait and mobility (R26.89);Difficulty in walking, not elsewhere classified (R26.2);Pain     Time: 9066-9043 PT Time Calculation (min) (ACUTE ONLY): 23 min  Charges:                            Keegan Ducey, SPT    Tongela Encinas 07/21/2024, 1:32 PM

## 2024-07-21 NOTE — Progress Notes (Addendum)
 Mobility Specialist - Progress Note     07/21/24 1159  Mobility  Activity Ambulated with assistance;Stood at bedside  Level of Assistance Contact guard assist, steadying assist  Assistive Device Front wheel walker  Distance Ambulated (ft) 90 ft  Range of Motion/Exercises Active  Activity Response Tolerated well  Mobility Referral Yes  Mobility visit 1 Mobility  Mobility Specialist Start Time (ACUTE ONLY) 1110  Mobility Specialist Stop Time (ACUTE ONLY) 1132  Mobility Specialist Time Calculation (min) (ACUTE ONLY) 22 min   Pt resting bed recliner on RA upon entry. Pt STS Mod/MaxA (Pt required several attempts to stand due to low chair sitting height) and ambulates to hallway around NS CGA with RW for safety. Pt requires extra time due to foot placement and pain in back, heavy limp on left side. P}t gait is slow and mildly stable. Pt returned to bed and left with needs in reach and bed alarm activated.   Guido Rumble Mobility Specialist 07/21/24, 12:19 PM

## 2024-07-22 DIAGNOSIS — M48061 Spinal stenosis, lumbar region without neurogenic claudication: Secondary | ICD-10-CM | POA: Diagnosis not present

## 2024-07-22 DIAGNOSIS — M48062 Spinal stenosis, lumbar region with neurogenic claudication: Secondary | ICD-10-CM | POA: Diagnosis not present

## 2024-07-22 LAB — HEMOGLOBIN A1C
Hgb A1c MFr Bld: 10.4 % — ABNORMAL HIGH (ref 4.8–5.6)
Mean Plasma Glucose: 252 mg/dL

## 2024-07-22 LAB — GLUCOSE, CAPILLARY
Glucose-Capillary: 142 mg/dL — ABNORMAL HIGH (ref 70–99)
Glucose-Capillary: 91 mg/dL (ref 70–99)
Glucose-Capillary: 114 mg/dL — ABNORMAL HIGH (ref 70–99)
Glucose-Capillary: 249 mg/dL — ABNORMAL HIGH (ref 70–99)

## 2024-07-22 NOTE — Progress Notes (Signed)
 Mobility Specialist - Progress Note   Pre-mobility: HR, BP, SpO2 During mobility: HR, BP, SpO2 Post-mobility: HR, BP, SPO2     07/22/24 1706  Mobility  Activity Stood at bedside;Ambulated with assistance  Level of Assistance Standby assist, set-up cues, supervision of patient - no hands on  Assistive Device Front wheel walker  Distance Ambulated (ft) 140 ft  Range of Motion/Exercises Active  Activity Response Tolerated well  Mobility Referral Yes  Mobility visit 1 Mobility  Mobility Specialist Start Time (ACUTE ONLY) 1657  Mobility Specialist Stop Time (ACUTE ONLY) 1721  Mobility Specialist Time Calculation (min) (ACUTE ONLY) 24 min   Pt resting in recliner on RA upon entry. Pt STS and ambulates to hallway around SBA with RW. Pt gait is slow but moderately steady with limp. Pt returned to recliner and left with needs in reach.   Guido Rumble Mobility Specialist 07/22/24, 5:29 PM

## 2024-07-22 NOTE — Progress Notes (Signed)
 PROGRESS NOTE    Sarah Duffy  FMW:[id number] DOB: July 10, 1971 DOA: 07/19/2024 PCP: [name], Krichna, MD  Chief Complaint  Patient presents with   Back Pain    Hospital Course:  Sarah Duffy is a 53 y.o. female with medical history significant of chronic lower back pain, HLD, MD, CAD, sCHF with EF 49%, CKD-3a, migraine, depression with anxiety presented with worsening lower back pain, radiating to posterior legs asso with leg weakness and gait instability, no bladder/bowel incontinence, had falls. Has chronic LBP since March, follows up at [facility] pain clinic and getting steroid injections (ESI) every 3 months, recent one delayed > 1 month.   Seen by neurosurgery, MRI shows moderate to severe canal stenosis with severe subarticular recess stenosis at L3-4, L4-5, L5-S1. Not a surgical candidate at this time due to uncontrolled DM. Pain control and discharge to SNF when place aavailable  Subjective: Seen at bedside,  States symptoms slightly better today, continues to require assistance and won't be able to go home independently (lives alone). Discussed HbA1c 10.4 TOC working on SNF placement   Objective: Vitals:   07/22/24 0427 07/22/24 0500 07/22/24 0730 07/22/24 1443  BP: (!) 113/59  117/63 (!) 93/51  Pulse: 79  71 61  Resp: 16  17 17   Temp: 98 F (36.7 C)  97.9 F (36.6 C) (!) 97.5 F (36.4 C)  TempSrc:    Oral  SpO2: 99%  100% 99%  Weight:  36.2 kg    Height:        Intake/Output Summary (Last 24 hours) at 07/22/2024 1612 Last data filed at 07/22/2024 1300 Gross per 24 hour  Intake 720 ml  Output --  Net 720 ml   Filed Weights   07/19/24 1809 07/21/24 0500 07/22/24 0500  Weight: 86.2 kg 36.1 kg 36.2 kg    Examination: General: Not in acute distress Cardiac: S1/S2, RRR, no gallops or rubs Respiratory: No rales, wheezing, rhonchi or rubs GI: Soft, nondistended, nontender, no rebound pain, no organomegaly, BS present GU: No hematuria Ext: has trace leg edema bilaterally.  1+DP/PT pulse bilaterally. Musculoskeletal: No midline tenderness Skin: No rashes.  Neuro: Alert, oriented X3, cranial nerves II-XII grossly intact, moves all extremities normally. Muscle strength 4/5 in both legs, and 5/5 in arms. Sensation to light touch intact.   Assessment & Plan:  Principal Problem:   Lumbar spinal stenosis Active Problems:   Fall at home, initial encounter   Coronary artery disease   Hypertension   Chronic combined systolic and diastolic CHF (congestive heart failure) (HCC)   Hyperlipidemia LDL goal <70   Type II diabetes mellitus with renal manifestations (HCC)   Asthma, well controlled   Chronic kidney disease, stage 3a (HCC)   Chronic migraine w/o aura, not intractable, w/o stat migr   Overweight (BMI 25.0-29.9)   Cervical myelopathy (HCC)   Impaired gait and mobility  Lumbar spinal stenosis with neurogenic claudication - Patient has severe pain, leg weakness and gait instability, lives alone at home - MRI of lumbar spine showed moderate to severe canal stenosis with subarticular recess stenosis in L3-L4, L4-L5, L5-S1 levels - Seen by Neurosurgery, appreciate recs. Not an appropriate candidate for surgical intervention due to poor DM control - Multimodal pain control - Tylenol , Lidoderm  patch, Robaxin  prn, Gabapentin . Avoid NSAID's due to CKD. Minimize opioids - Tramadol  prn - Discontinue prednisone  - PT/OT rec SNF, pending placement - Has appt with [facility] 08/13 - Follow up outpatient for surgical intervention once HbA1c controlled   Fall  at home, initial encounter: Reports head injury. - Fall precaution - PT/OT rec SNF - CT Head negative   Coronary artery disease: No chest pain -Continue aspirin , Lipitor, Zetia  - As needed nitroglycerin    Hypertension -IV hydralazine  as needed - Patient is on spironolactone , amlodipine  and Entresto    Chronic combined systolic and diastolic CHF (congestive heart failure) (HCC): 2D echo on 04/02/2024 showed EF  of 49% with grade 1 diastolic dysfunction.  Patient has trace leg edema, no SOB, no JVD, CHF seem to be compensated. -Continue home spironolactone , Entresto , Coreg    Hyperlipidemia LDL goal <70 -Lipitor, Zetia    Type II diabetes mellitus with renal manifestations [facility]):  Recent A1c 10.4, poorly controlled (08/25).  Patient taking NovoLog , Jardiance , Ozempic  and glargine insulin  70 unit nightly - SSI, add Novolog  8u TID with meals - Inc Glargine insulin  60 units daily   Asthma, well controlled: Stable - as needed Mucinex    Chronic kidney disease, stage 3a (HCC) - Cr baseline 1.1-1.2   Chronic migraine w/o aura, not intractable, w/o stat migraine:  - Patient is on Emgality  injection every month for prophylaxis.   Overweight (BMI 25.0-29.9): Body weight 86.2 kg, BMI 28.04 - Encourage losing weight - Exercise and healthy diet  DVT prophylaxis: SQ Lovenox    Code Status: Full Code Disposition:  SNF  Consultants:  None  Procedures:  None  Antimicrobials:  Anti-infectives (From admission, onward)    None       Data Reviewed: I have personally reviewed following labs and imaging studies CBC: Recent Labs  Lab 07/20/24 0040  WBC 6.2  HGB 10.1*  HCT 30.5*  MCV 87.1  PLT 200   Basic Metabolic Panel: Recent Labs  Lab 07/20/24 0040 07/21/24 0211  NA 139 138  K 5.0 4.6  CL 107 105  CO2 23 25  GLUCOSE 286* 160*  BUN 34* 44*  CREATININE 1.28* 1.19*  CALCIUM  9.1 9.5   GFR: Estimated Creatinine Clearance: 30.9 mL/min (A) (by C-G formula based on SCr of 1.19 mg/dL (H)). Liver Function Tests: No results for input(s): AST, ALT, ALKPHOS, BILITOT, PROT, ALBUMIN in the last 168 hours. CBG: Recent Labs  Lab 07/21/24 1646 07/21/24 2129 07/22/24 0758 07/22/24 1126 07/22/24 1607  GLUCAP 138* 220* 142* 91 114*    No results found for this or any previous visit (from the past 240 hours).   Radiology Studies: No results found.   Scheduled Meds:   amLODipine   5 mg Oral QHS   aspirin  EC  81 mg Oral QHS   atorvastatin   80 mg Oral Daily   carvedilol   12.5 mg Oral BID WC   enoxaparin  (LOVENOX ) injection  40 mg Subcutaneous Q24H   ezetimibe   10 mg Oral Daily   gabapentin   800 mg Oral TID   insulin  aspart  0-5 Units Subcutaneous QHS   insulin  aspart  0-9 Units Subcutaneous TID WC   insulin  aspart  8 Units Subcutaneous TID with meals   insulin  glargine-yfgn  60 Units Subcutaneous QHS   lidocaine   1 patch Transdermal QHS   sacubitril -valsartan   1 tablet Oral BID   spironolactone   25 mg Oral Daily   Continuous Infusions:   LOS: 0 days  MDM: Patient is high risk for one or more organ failure.  They necessitate ongoing hospitalization for continued IV therapies and subsequent lab monitoring. Total time spent interpreting labs and vitals, reviewing the medical record, coordinating care amongst consultants and care team members, directly assessing and discussing care with the patient and/or  family: 35 min Laree Lock, MD [facility]  To contact the attending physician between 7A-7P please use Epic Chat. To contact the covering physician during after hours 7P-7A, please review Amion.  07/22/2024, 4:12 PM   *This document has been created with the assistance of dictation software. Please excuse typographical errors. *

## 2024-07-22 NOTE — Progress Notes (Signed)
 Occupational Therapy Treatment Patient Details Name: Sarah Duffy MRN: [id number] DOB: 1971-11-08 Today's Date: 07/22/2024   History of present illness Pt is a 52 y/o F admitted on 07/19/24 after presenting with c/o worsening lower back pain, reports 2 falls this past week. Lumbar MRI showed moderate to severe canal stenosis with subarticular recess stenosis in L3-L4, L4-L5, L5-S1 levels. Neurosurgery consulted; pt not a candidate for surgical treatment. PMH: chronic lower back pain, HLD, MD, CAD, sCHF, CKD3A, migraine, depression, anxiety   OT comments  Pt seen for OT tx. Pt pleasant and agreeable. Pt required initial MOD A for STS from recliner, improving to MIN A on repeated transfers from both the recliner and BSC over the toilet after cues for hand/foot placement and anterior weight shift prior to standing attempts. Pt completed pericare in sitting independently, requiring SBA-CGA to complete clothing mgt in standing. Once returned to the recliner, pt instructed in AE/DME for LB ADL and IADL to improve safety, minimize pain, and maximize independence. Pt able to return demo proper technique with reacher to doff/don underwear without direct assist required (aside from the transfer to standing). Pt educated in cognitive behavioral pain coping strategies to support overall pain mgt. Encouraged her to reach out to family/friends for assist with housekeeping tasks in the short term so she does not overdo it. Pt verbalized understanding and very appreciative. Pt continues to benefit from skilled OT services.       If plan is discharge home, recommend the following:  A little help with walking and/or transfers;A little help with bathing/dressing/bathroom;Help with stairs or ramp for entrance;Assistance with cooking/housework   Equipment Recommendations  BSC/3in1;Tub/shower seat    Recommendations for Other Services      Precautions / Restrictions Precautions Precautions: Fall Recall of  Precautions/Restrictions: Intact Restrictions Weight Bearing Restrictions Per Provider Order: No       Mobility Bed Mobility               General bed mobility comments: NT, in recliner pre and post session    Transfers Overall transfer level: Needs assistance Equipment used: Rolling walker (2 wheels) Transfers: Sit to/from Stand Sit to Stand: Mod assist, Min assist           General transfer comment: MOD A initially, improving to MIN A from recliner and from [facility] over toilet, VC for feet positioning and anterior weight shift prior to standing     Balance Overall balance assessment: Needs assistance Sitting-balance support: Feet supported Sitting balance-Leahy Scale: Good     Standing balance support: Reliant on assistive device for balance, During functional activity Standing balance-Leahy Scale: Fair          ADL either performed or assessed with clinical judgement   ADL Overall ADL's : Needs assistance/impaired     Grooming: Wash/dry hands;Standing;Contact guard assist Grooming Details (indicate cue type and reason): with RW at sink level             Lower Body Dressing: Sit to/from stand;With adaptive equipment;Minimal assistance Lower Body Dressing Details (indicate cue type and reason): MIN-MOD A to stand but pt able to use reacher after instruction to doff/don underwear in standing without additional assistance. Toilet Transfer: Minimal assistance;Rolling walker (2 wheels);BSC/3in1;Regular Toilet   Toileting- Clothing Manipulation and Hygiene: Supervision/safety;Sitting/lateral lean       Functional mobility during ADLs: Contact guard assist;Minimal assistance;Rolling walker (2 wheels)      Extremity/Trunk Assessment Upper Extremity Assessment Upper Extremity Assessment: Generalized weakness   Lower Extremity Assessment  Lower Extremity Assessment: Generalized weakness         Cognition Arousal: Alert Behavior During Therapy: WFL for  tasks assessed/performed Cognition: No apparent impairments        Following commands: Intact        Cueing      Exercises Other Exercises Other Exercises: Pt educated in AE/DME for LB ADL tasks and bathroom set up to maximize safety and indep with ADL and IADL     Pertinent Vitals/ Pain       Pain Assessment Pain Assessment: 0-10 Pain Score: 8  Pain Location: low back Pain Descriptors / Indicators: Discomfort, Aching Pain Intervention(s): Limited activity within patient's tolerance, Monitored during session, Premedicated before session, Repositioned   Frequency  Min 3X/week        Progress Toward Goals  OT Goals(current goals can now be found in the care plan section)  Progress towards OT goals: Progressing toward goals  Acute Rehab OT Goals Patient Stated Goal: improve function OT Goal Formulation: With patient Time For Goal Achievement: 08/03/24 Potential to Achieve Goals: Good   AM-PAC OT 6 Clicks Daily Activity     Outcome Measure   Help from another person eating meals?: None Help from another person taking care of personal grooming?: None Help from another person toileting, which includes using toliet, bedpan, or urinal?: A Little Help from another person bathing (including washing, rinsing, drying)?: A Little Help from another person to put on and taking off regular upper body clothing?: None Help from another person to put on and taking off regular lower body clothing?: A Little 6 Click Score: 21    End of Session Equipment Utilized During Treatment: Rolling walker (2 wheels)  OT Visit Diagnosis: Other abnormalities of gait and mobility (R26.89);Muscle weakness (generalized) (M62.81)   Activity Tolerance Patient tolerated treatment well   Patient Left in chair;with call bell/phone within reach   Nurse Communication          Time: 9092-8998 OT Time Calculation (min): 54 min  Charges: OT General Charges $OT Visit: 1 Visit OT  Treatments $Self Care/Home Management : 53-67 mins  Warren SAUNDERS., MPH, MS, OTR/L ascom [phone] 07/22/24, 10:48 AM

## 2024-07-22 NOTE — Plan of Care (Signed)

## 2024-07-23 DIAGNOSIS — M48062 Spinal stenosis, lumbar region with neurogenic claudication: Secondary | ICD-10-CM | POA: Diagnosis not present

## 2024-07-23 DIAGNOSIS — M48061 Spinal stenosis, lumbar region without neurogenic claudication: Secondary | ICD-10-CM | POA: Diagnosis not present

## 2024-07-23 LAB — GLUCOSE, CAPILLARY
Glucose-Capillary: 76 mg/dL (ref 70–99)
Glucose-Capillary: 221 mg/dL — ABNORMAL HIGH (ref 70–99)
Glucose-Capillary: 144 mg/dL — ABNORMAL HIGH (ref 70–99)
Glucose-Capillary: 181 mg/dL — ABNORMAL HIGH (ref 70–99)

## 2024-07-23 MED ORDER — HYDROMORPHONE HCL 1 MG/ML IJ SOLN
0.4000 mg | Freq: Four times a day (QID) | INTRAMUSCULAR | Status: DC | PRN
  Administered 2024-07-23: 0.4 mg via INTRAVENOUS
  Filled 2024-07-23: qty 0.5

## 2024-07-23 MED ORDER — INSULIN ASPART 100 UNIT/ML IJ SOLN
10.0000 [IU] | Freq: Three times a day (TID) | INTRAMUSCULAR | Status: DC
  Administered 2024-07-23 – 2024-07-27 (×14): 10 [IU] via SUBCUTANEOUS
  Filled 2024-07-23 (×10): qty 1

## 2024-07-23 NOTE — TOC Progression Note (Addendum)
 Transition of Care [facility]) - Progression Note    Patient Details  Name: Sarah Duffy MRN: [id number] Date of Birth: 10-Jan-1971  Transition of Care [facility] CM/SW Contact  [place], [place] Phone Number: 07/23/2024, 10:46 AM  Clinical Narrative:     CSW spoke with patient and advised of bed offer for [facility]. Patient advised that she was uncertain about going to a SNF. CSW and patient discussed the benefits of SNF/Rehab and outpatient Physical Therapy. Patient advised that she will speak with her son about her options and she will call CSW to advise of her decision.   Attending notified. CSW will await patient response. [name] offered patient a bed however, patient would not be able to transfer until Monday as there is no weekend staff available at the facility.    4:28pm: Patient accepted bed offer at [facility]. Admissions coordinator notified but response not received.                  Expected Discharge Plan and Services                                               Social Drivers of Health (SDOH) Interventions SDOH Screenings   Food Insecurity: No Food Insecurity (07/20/2024)  Recent Concern: Food Insecurity - Food Insecurity Present (05/15/2024)   Received from [facility]  Housing: High Risk (07/20/2024)  Transportation Needs: No Transportation Needs (07/20/2024)  Recent Concern: Transportation Needs - Unmet Transportation Needs (05/15/2024)   Received from [facility]  Utilities: Not At Risk (07/20/2024)  Recent Concern: Utilities - At Risk (05/15/2024)   Received from [facility]  Alcohol Screen: Low Risk  (02/26/2023)  Depression (PHQ2-9): Low Risk  (07/05/2024)  Financial Resource Strain: High Risk (05/15/2024)   Received from [facility]  Physical Activity: Insufficiently Active (05/15/2024)   Received from [facility]  Social Connections: Unknown  (07/20/2024)  Recent Concern: Social Connections - Moderately Isolated (05/15/2024)   Received from [facility]  Stress: Stress Concern Present (05/15/2024)   Received from [facility]  Tobacco Use: Low Risk  (07/19/2024)  Health Literacy: Adequate Health Literacy (05/15/2024)   Received from [facility]    Readmission Risk Interventions     No data to display

## 2024-07-23 NOTE — Progress Notes (Signed)
 PROGRESS NOTE    Sarah Duffy  FMW:[id number] DOB: 11/15/1971 DOA: 07/19/2024 PCP: [name], Krichna, MD  Chief Complaint  Patient presents with   Back Pain    Hospital Course:  Al Gagen is a 53 y.o. female with medical history significant of chronic lower back pain, HLD, MD, CAD, sCHF with EF 49%, CKD-3a, migraine, depression with anxiety presented with worsening lower back pain, radiating to posterior legs asso with leg weakness and gait instability, no bladder/bowel incontinence, had falls. Has chronic LBP since March, follows up at [facility] pain clinic and getting steroid injections (ESI) every 3 months, recent one delayed > 1 month.   Seen by neurosurgery, MRI shows moderate to severe canal stenosis with severe subarticular recess stenosis at L3-4, L4-5, L5-S1. Not a surgical candidate at this time due to uncontrolled DM. Pain control and discharge to SNF when place available  Subjective: Seen at bedside,  States had symptoms early today and also in the morning requiring IV Dilaudid . TOC working on SNF placement, anticipate discharge tomorrow   Objective: Vitals:   07/22/24 1935 07/23/24 0356 07/23/24 0500 07/23/24 0735  BP: 108/70 131/70  131/62  Pulse: 72 73  75  Resp: 16 16  17   Temp: (!) 97.5 F (36.4 C) 98.1 F (36.7 C)  98.1 F (36.7 C)  TempSrc:    Oral  SpO2: 100% 99%  100%  Weight:   85.7 kg   Height:        Intake/Output Summary (Last 24 hours) at 07/23/2024 1516 Last data filed at 07/23/2024 0900 Gross per 24 hour  Intake 480 ml  Output --  Net 480 ml   Filed Weights   07/21/24 0500 07/22/24 0500 07/23/24 0500  Weight: 36.1 kg 36.2 kg 85.7 kg    Examination: General: Not in acute distress Cardiac: S1/S2, RRR, no gallops or rubs Respiratory: No rales, wheezing, rhonchi or rubs GI: Soft, nondistended, nontender, no rebound pain, no organomegaly, BS present GU: No hematuria Ext: has trace leg edema bilaterally. 1+DP/PT pulse  bilaterally. Musculoskeletal: No midline tenderness Skin: No rashes.  Neuro: Alert, oriented X3, cranial nerves II-XII grossly intact, moves all extremities normally. Muscle strength 4/5 in both legs, and 5/5 in arms. Sensation to light touch intact.   Assessment & Plan:  Principal Problem:   Lumbar spinal stenosis Active Problems:   Fall at home, initial encounter   Coronary artery disease   Hypertension   Chronic combined systolic and diastolic CHF (congestive heart failure) (HCC)   Hyperlipidemia LDL goal <70   Type II diabetes mellitus with renal manifestations (HCC)   Asthma, well controlled   Chronic kidney disease, stage 3a (HCC)   Chronic migraine w/o aura, not intractable, w/o stat migr   Overweight (BMI 25.0-29.9)   Cervical myelopathy (HCC)   Impaired gait and mobility  Lumbar spinal stenosis with neurogenic claudication - Patient has severe pain, leg weakness and gait instability, lives alone at home - MRI of lumbar spine showed moderate to severe canal stenosis with subarticular recess stenosis in L3-L4, L4-L5, L5-S1 levels - Seen by Neurosurgery, appreciate recs. Not an appropriate candidate for surgical intervention due to poor DM control - Multimodal pain control - Tylenol , Lidoderm  patch, Robaxin  prn, Gabapentin . Avoid NSAID's due to CKD. Minimize opioids - Tramadol  prn. Required one dose IV Dilaudid  today 08/10 - Discontinue prednisone  - PT/OT rec SNF, pending placement - Has appt with [facility] 08/13 - Follow up outpatient for surgical intervention once HbA1c controlled   Fall at  home, initial encounter: Reports head injury. - Fall precaution - PT/OT rec SNF - CT Head negative   Coronary artery disease: No chest pain -Continue aspirin , Lipitor, Zetia  - As needed nitroglycerin    Hypertension -IV hydralazine  as needed - Patient is on spironolactone , amlodipine  and Entresto    Chronic combined systolic and diastolic CHF (congestive heart failure) (HCC):  2D echo on 04/02/2024 showed EF of 49% with grade 1 diastolic dysfunction.  Patient has trace leg edema, no SOB, no JVD, CHF seem to be compensated. -Continue home spironolactone , Entresto , Coreg    Hyperlipidemia LDL goal <70 -Lipitor, Zetia    Type II diabetes mellitus with renal manifestations [facility]):  Recent A1c 10.4, poorly controlled (08/25).  Patient taking NovoLog , Jardiance , Ozempic  and glargine insulin  70 unit nightly - SSI, Inc Novolog  10u TID with meals - Inc Glargine insulin  60 units daily   Asthma, well controlled: Stable - as needed Mucinex    Chronic kidney disease, stage 3a (HCC) - Cr baseline 1.1-1.2   Chronic migraine w/o aura, not intractable, w/o stat migraine:  - Patient is on Emgality  injection every month for prophylaxis.   Overweight (BMI 25.0-29.9): Body weight 86.2 kg, BMI 28.04 - Encourage losing weight - Exercise and healthy diet  DVT prophylaxis: SQ Lovenox    Code Status: Full Code Disposition:  SNF - awaiting placement  Consultants:  None  Procedures:  None  Antimicrobials:  Anti-infectives (From admission, onward)    None       Data Reviewed: I have personally reviewed following labs and imaging studies CBC: Recent Labs  Lab 07/20/24 0040  WBC 6.2  HGB 10.1*  HCT 30.5*  MCV 87.1  PLT 200   Basic Metabolic Panel: Recent Labs  Lab 07/20/24 0040 07/21/24 0211  NA 139 138  K 5.0 4.6  CL 107 105  CO2 23 25  GLUCOSE 286* 160*  BUN 34* 44*  CREATININE 1.28* 1.19*  CALCIUM  9.1 9.5   GFR: Estimated Creatinine Clearance: 63.1 mL/min (A) (by C-G formula based on SCr of 1.19 mg/dL (H)). Liver Function Tests: No results for input(s): AST, ALT, ALKPHOS, BILITOT, PROT, ALBUMIN in the last 168 hours. CBG: Recent Labs  Lab 07/22/24 1126 07/22/24 1607 07/22/24 2107 07/23/24 0810 07/23/24 1140  GLUCAP 91 114* 249* 76 221*    No results found for this or any previous visit (from the past 240 hours).   Radiology  Studies: No results found.   Scheduled Meds:  amLODipine   5 mg Oral QHS   aspirin  EC  81 mg Oral QHS   atorvastatin   80 mg Oral Daily   carvedilol   12.5 mg Oral BID WC   enoxaparin  (LOVENOX ) injection  40 mg Subcutaneous Q24H   ezetimibe   10 mg Oral Daily   gabapentin   800 mg Oral TID   insulin  aspart  0-5 Units Subcutaneous QHS   insulin  aspart  0-9 Units Subcutaneous TID WC   insulin  aspart  8 Units Subcutaneous TID with meals   insulin  glargine-yfgn  60 Units Subcutaneous QHS   lidocaine   1 patch Transdermal QHS   sacubitril -valsartan   1 tablet Oral BID   spironolactone   25 mg Oral Daily   Continuous Infusions:   LOS: 0 days  MDM: Patient is high risk for one or more organ failure.  They necessitate ongoing hospitalization for continued IV therapies and subsequent lab monitoring. Total time spent interpreting labs and vitals, reviewing the medical record, coordinating care amongst consultants and care team members, directly assessing and discussing care with the  patient and/or family: 35 min Laree Lock, MD [facility]  To contact the attending physician between 7A-7P please use Epic Chat. To contact the covering physician during after hours 7P-7A, please review Amion.  07/23/2024, 3:16 PM   *This document has been created with the assistance of dictation software. Please excuse typographical errors. *

## 2024-07-23 NOTE — Plan of Care (Signed)

## 2024-07-23 NOTE — Progress Notes (Signed)
 Mobility Specialist - Progress Note   07/23/24 1500  Mobility  Activity Ambulated with assistance;Stood at bedside  Level of Assistance Standby assist, set-up cues, supervision of patient - no hands on  Assistive Device Front wheel walker  Distance Ambulated (ft) 160 ft  Range of Motion/Exercises Active  Activity Response Tolerated well  Mobility Referral Yes  Mobility visit 1 Mobility  Mobility Specialist Start Time (ACUTE ONLY) 1456  Mobility Specialist Stop Time (ACUTE ONLY) 1513  Mobility Specialist Time Calculation (min) (ACUTE ONLY) 17 min   Pt resting in recliner on RA upon entry. Pt STS MinA and ambulates to hallway around NS SBA with RW. Pt gait is moderately steady with limp. Pt returned to recliner and left with needs in reach. Sister present at bedside.   Guido Rumble Mobility Specialist 07/23/24, 3:39 PM

## 2024-07-24 ENCOUNTER — Other Ambulatory Visit (HOSPITAL_COMMUNITY): Payer: Self-pay

## 2024-07-24 ENCOUNTER — Telehealth: Payer: Self-pay | Admitting: Pharmacy Technician

## 2024-07-24 DIAGNOSIS — M48061 Spinal stenosis, lumbar region without neurogenic claudication: Secondary | ICD-10-CM | POA: Diagnosis not present

## 2024-07-24 DIAGNOSIS — M48062 Spinal stenosis, lumbar region with neurogenic claudication: Secondary | ICD-10-CM | POA: Diagnosis not present

## 2024-07-24 LAB — GLUCOSE, CAPILLARY
Glucose-Capillary: 230 mg/dL — ABNORMAL HIGH (ref 70–99)
Glucose-Capillary: 71 mg/dL (ref 70–99)
Glucose-Capillary: 192 mg/dL — ABNORMAL HIGH (ref 70–99)
Glucose-Capillary: 193 mg/dL — ABNORMAL HIGH (ref 70–99)

## 2024-07-24 MED ORDER — INSULIN GLARGINE-YFGN 100 UNIT/ML ~~LOC~~ SOLN
65.0000 [IU] | Freq: Every day | SUBCUTANEOUS | Status: DC
  Filled 2024-07-24: qty 0.65

## 2024-07-24 MED ORDER — INSULIN GLARGINE-YFGN 100 UNIT/ML ~~LOC~~ SOLN
60.0000 [IU] | Freq: Every day | SUBCUTANEOUS | Status: DC
  Administered 2024-07-24 – 2024-07-26 (×6): 60 [IU] via SUBCUTANEOUS
  Filled 2024-07-24 (×4): qty 0.6

## 2024-07-24 NOTE — Plan of Care (Signed)
   Problem: Pain Managment: Goal: General experience of comfort will improve and/or be controlled Outcome: Not Progressing

## 2024-07-24 NOTE — Telephone Encounter (Signed)
 Pharmacy Patient Advocate Encounter   Received notification from Onbase that prior authorization for Emgality  120MG /ML auto-injectors (migraine) is due for renewal.   Insurance verification completed.   The patient is insured through [employer] [place] MEDICAID.  Action: PA required; PA submitted to above mentioned insurance via CoverMyMeds Key/confirmation #/EOC [id number] Status is pending

## 2024-07-24 NOTE — Inpatient Diabetes Management (Signed)
 Inpatient Diabetes Program Recommendations  AACE/ADA: New Consensus Statement on Inpatient Glycemic Control   Target Ranges:  Prepandial:   less than 140 mg/dL      Peak postprandial:   less than 180 mg/dL (1-2 hours)      Critically ill patients:  140 - 180 mg/dL    Latest Reference Range & Units 07/23/24 08:10 07/23/24 11:40 07/23/24 16:22 07/23/24 21:37 07/24/24 07:12 07/24/24 12:03  Glucose-Capillary 70 - 99 mg/dL 76 778 (H) 855 (H) 818 (H) 230 (H) 71    Review of Glycemic Control  Diabetes history: DM2 Outpatient Diabetes medications:  Basaglar  70 units at bedtime, Jardiance  25 mg daily, Novolog  10 units TID with meals, Ozempic  2 mg Qweek, Dexcom G7 CGM  Current orders for Inpatient glycemic control: Semglee  65 units at bedtime, Novolog  10 units TID with meals, Novolog  0-9 units TID with meals, Novolog  0-5 units QHS  Inpatient Diabetes Program Recommendations:    Insulin : Fasting CBG 76 on 8/10 and 230 mg/dl today. CBG 71 mg/dl at 87:96 today.   Please decrease Semglee  back down to 60 units at bedtime and decrease Novolog  meal coverage to Novolog  8 units TID with meals.  Thanks, Earnie Gainer, RN, MSN, CDCES Diabetes Coordinator Inpatient Diabetes Program [phone] (Team Pager from 8am to 5pm)

## 2024-07-24 NOTE — Progress Notes (Signed)
 PROGRESS NOTE    Sarah Duffy  FMW:[id number] DOB: 1971-02-02 DOA: 07/19/2024 PCP: [name], Krichna, MD  Chief Complaint  Patient presents with   Back Pain    Hospital Course:  Roann Merk is a 53 y.o. female with medical history significant of chronic lower back pain, HLD, MD, CAD, sCHF with EF 49%, CKD-3a, migraine, depression with anxiety presented with worsening lower back pain, radiating to posterior legs asso with leg weakness and gait instability, no bladder/bowel incontinence, had falls. Has chronic LBP since March, follows up at [facility] pain clinic and getting steroid injections (ESI) every 3 months, recent one delayed > 1 month.   Seen by neurosurgery, MRI shows moderate to severe canal stenosis with severe subarticular recess stenosis at L3-4, L4-5, L5-S1. Not a surgical candidate at this time due to uncontrolled DM. Pain control and discharge to SNF when place available  Subjective: Seen at bedside,  States had symptoms earlier today which resolved with Tramadol . Medically ready TOC working on SNF placement, anticipate bed availability on Thursday 08/14   Objective: Vitals:   07/23/24 1944 07/23/24 2133 07/24/24 0435 07/24/24 0713  BP: (!) 97/52 108/63 (!) 103/52 114/75  Pulse: 67 77 72 87  Resp: 18  18 17   Temp: 97.7 F (36.5 C)  98.3 F (36.8 C) 97.9 F (36.6 C)  TempSrc: Oral  Oral Oral  SpO2: 99% 99% 100% 100%  Weight:      Height:        Intake/Output Summary (Last 24 hours) at 07/24/2024 1527 Last data filed at 07/24/2024 1300 Gross per 24 hour  Intake 720 ml  Output --  Net 720 ml   Filed Weights   07/21/24 0500 07/22/24 0500 07/23/24 0500  Weight: 36.1 kg 36.2 kg 85.7 kg    Examination: General: Not in acute distress Cardiac: S1/S2, RRR, no gallops or rubs Respiratory: No rales, wheezing, rhonchi or rubs GI: Soft, nondistended, nontender, no rebound pain, no organomegaly, BS present GU: No hematuria Ext: has trace leg edema bilaterally. 1+DP/PT  pulse bilaterally. Musculoskeletal: No midline tenderness Skin: No rashes.  Neuro: Alert, oriented X3, cranial nerves II-XII grossly intact, moves all extremities normally. Muscle strength 4/5 in both legs, and 5/5 in arms. Sensation to light touch intact.   Assessment & Plan:  Principal Problem:   Lumbar spinal stenosis Active Problems:   Fall at home, initial encounter   Coronary artery disease   Hypertension   Chronic combined systolic and diastolic CHF (congestive heart failure) (HCC)   Hyperlipidemia LDL goal <70   Type II diabetes mellitus with renal manifestations (HCC)   Asthma, well controlled   Chronic kidney disease, stage 3a (HCC)   Chronic migraine w/o aura, not intractable, w/o stat migr   Overweight (BMI 25.0-29.9)   Cervical myelopathy (HCC)   Impaired gait and mobility  Lumbar spinal stenosis with neurogenic claudication - Patient has severe pain, leg weakness and gait instability, lives alone at home - MRI of lumbar spine showed moderate to severe canal stenosis with subarticular recess stenosis in L3-L4, L4-L5, L5-S1 levels - Seen by Neurosurgery, appreciate recs. Not an appropriate candidate for surgical intervention due to poor DM control - Multimodal pain control - Tylenol , Lidoderm  patch, Robaxin  prn, Gabapentin . Avoid NSAID's due to CKD. Minimize opioids - Tramadol  prn - Discontinue prednisone  - PT/OT rec SNF, pending placement - Has appt with [facility] 08/13 - Follow up outpatient for surgical intervention once HbA1c controlled   Fall at home, initial encounter: Reports head injury. -  Fall precaution - PT/OT rec SNF - CT Head negative   Coronary artery disease: No chest pain -Continue aspirin , Lipitor, Zetia  - As needed nitroglycerin    Hypertension -IV hydralazine  as needed - Patient is on spironolactone , amlodipine  and Entresto    Chronic combined systolic and diastolic CHF (congestive heart failure) (HCC): 2D echo on 04/02/2024 showed EF of 49%  with grade 1 diastolic dysfunction.  Patient has trace leg edema, no SOB, no JVD, CHF seem to be compensated. -Continue home spironolactone , Entresto , Coreg    Hyperlipidemia LDL goal <70 -Lipitor, Zetia    Type II diabetes mellitus with renal manifestations [facility]):  Recent A1c 10.4, poorly controlled (08/25).  Patient taking NovoLog , Jardiance , Ozempic  and glargine insulin  70 unit nightly - SSI[employer] Inc Novolog  10u TID with meals - On Glargine insulin  60 units daily - Follows up with Endocrinology outpatient   Asthma, well controlled: Stable - as needed Mucinex    Chronic kidney disease, stage 3a (HCC) - Cr baseline 1.1-1.2   Chronic migraine w/o aura, not intractable, w/o stat migraine:  - Patient is on Emgality  injection every month for prophylaxis.   Overweight (BMI 25.0-29.9): Body weight 86.2 kg, BMI 28.04 - Encourage losing weight - Exercise and healthy diet  DVT prophylaxis: SQ Lovenox    Code Status: Full Code Disposition:  SNF - awaiting placement  Consultants:  None  Procedures:  None  Antimicrobials:  Anti-infectives (From admission, onward)    None       Data Reviewed: I have personally reviewed following labs and imaging studies CBC: Recent Labs  Lab 07/20/24 0040  WBC 6.2  HGB 10.1*  HCT 30.5*  MCV 87.1  PLT 200   Basic Metabolic Panel: Recent Labs  Lab 07/20/24 0040 07/21/24 0211  NA 139 138  K 5.0 4.6  CL 107 105  CO2 23 25  GLUCOSE 286* 160*  BUN 34* 44*  CREATININE 1.28* 1.19*  CALCIUM  9.1 9.5   GFR: Estimated Creatinine Clearance: 63.1 mL/min (A) (by C-G formula based on SCr of 1.19 mg/dL (H)). Liver Function Tests: No results for input(s): AST, ALT, ALKPHOS, BILITOT, PROT, ALBUMIN in the last 168 hours. CBG: Recent Labs  Lab 07/23/24 1140 07/23/24 1622 07/23/24 2137 07/24/24 0712 07/24/24 1203  GLUCAP 221* 144* 181* 230* 71    No results found for this or any previous visit (from the past 240 hours).    Radiology Studies: No results found.   Scheduled Meds:  amLODipine   5 mg Oral QHS   aspirin  EC  81 mg Oral QHS   atorvastatin   80 mg Oral Daily   carvedilol   12.5 mg Oral BID WC   enoxaparin  (LOVENOX ) injection  40 mg Subcutaneous Q24H   ezetimibe   10 mg Oral Daily   gabapentin   800 mg Oral TID   insulin  aspart  0-5 Units Subcutaneous QHS   insulin  aspart  0-9 Units Subcutaneous TID WC   insulin  aspart  10 Units Subcutaneous TID with meals   insulin  glargine-yfgn  65 Units Subcutaneous QHS   lidocaine   1 patch Transdermal QHS   sacubitril -valsartan   1 tablet Oral BID   spironolactone   25 mg Oral Daily   Continuous Infusions:   LOS: 0 days  MDM: Patient is high risk for one or more organ failure.  They necessitate ongoing hospitalization for continued IV therapies and subsequent lab monitoring. Total time spent interpreting labs and vitals, reviewing the medical record, coordinating care amongst consultants and care team members, directly assessing and discussing care with the patient  and/or family: 35 min Laree Lock, MD [facility]  To contact the attending physician between 7A-7P please use Epic Chat. To contact the covering physician during after hours 7P-7A, please review Amion.  07/24/2024, 3:27 PM   *This document has been created with the assistance of dictation software. Please excuse typographical errors. *

## 2024-07-24 NOTE — Progress Notes (Signed)
 Physical Therapy Treatment Patient Details Name: Sarah Duffy MRN: [id number] DOB: 02-27-1971 Today's Date: 07/24/2024   History of Present Illness Pt is a 53 y/o F admitted on 07/19/24 after presenting with c/o worsening lower back pain, reports 2 falls this past week. Lumbar MRI showed moderate to severe canal stenosis with subarticular recess stenosis in L3-L4, L4-L5, L5-S1 levels. Neurosurgery consulted; pt not a candidate for surgical treatment. PMH: chronic lower back pain, HLD, MD, CAD, sCHF, CKD3A, migraine, depression, anxiety    PT Comments  Pt in chair, ready for session.  She stands with mod a x 1 for assist/balance on first attempt.  Fall likely without intervention.  She is able to progress gait 1 1/2 lap with heavy reliance on RW and generally unsteady gait.  Berg balance test is given with score 12/56 indicating high risk for falls.  Balance and pain barriers both affect low score.  Standing exercises and 5x sit to stand with improving transition to standing during session.  Pt stated she lives alone and does not have help. Pt will need continued therapies.  She continues to need +1 assist for mobility skills for safety.  Would struggle at home with basic ADL and mobility unassisted.   If plan is discharge home, recommend the following: A little help with bathing/dressing/bathroom;Assistance with cooking/housework;A lot of help with walking and/or transfers;Direct supervision/assist for medications management;Assist for transportation;Help with stairs or ramp for entrance   Can travel by private vehicle        Equipment Recommendations  Rolling walker (2 wheels);BSC/3in1    Recommendations for Other Services       Precautions / Restrictions Precautions Precautions: Fall Recall of Precautions/Restrictions: Intact Restrictions Weight Bearing Restrictions Per Provider Order: No     Mobility  Bed Mobility               General bed mobility comments: NT, in recliner  pre and post session Patient Response: Cooperative  Transfers Overall transfer level: Needs assistance Equipment used: Rolling walker (2 wheels) Transfers: Sit to/from Stand Sit to Stand: Mod assist, Min assist           General transfer comment: mod a initially then progressing to min a x 1 after session and 5x sit to stand    Ambulation/Gait Ambulation/Gait assistance: Contact guard assist Gait Distance (Feet): 220 Feet Assistive device: Rolling walker (2 wheels) Gait Pattern/deviations: Step-to pattern, Decreased dorsiflexion - right, Decreased dorsiflexion - left, Knees buckling Gait velocity: decreased     General Gait Details: heavy reliance on RW and geneally unsteady with inc fall risk   Stairs             Wheelchair Mobility     Tilt Bed Tilt Bed Patient Response: Cooperative  Modified Rankin (Stroke Patients Only)       Balance Overall balance assessment: Needs assistance Sitting-balance support: Feet supported Sitting balance-Leahy Scale: Good Sitting balance - Comments: Pt able to sit safety at EOB with both feet supported   Standing balance support: Bilateral upper extremity supported Standing balance-Leahy Scale: Poor Standing balance comment: Pt requiring RW for standing balance secondary to LE weakness                 Standardized Balance Assessment Standardized Balance Assessment : Berg Balance Test Berg Balance Test Sit to Stand: Needs moderate or maximal assist to stand Standing Unsupported: Able to stand 30 seconds unsupported Sitting with Back Unsupported but Feet Supported on Floor or Stool: Able to sit safely  and securely 2 minutes Stand to Sit: Controls descent by using hands Transfers: Able to transfer with verbal cueing and /or supervision Standing Unsupported with Eyes Closed: Unable to keep eyes closed 3 seconds but stays steady Standing Ubsupported with Feet Together: Needs help to attain position and unable to hold  for 15 seconds From Standing, Reach Forward with Outstretched Arm: Loses balance while trying/requires external support From Standing Position, Pick up Object from Floor: Unable to try/needs assist to keep balance From Standing Position, Turn to Look Behind Over each Shoulder: Needs assist to keep from losing balance and falling Turn 360 Degrees: Needs assistance while turning Standing Unsupported, Alternately Place Feet on Step/Stool: Needs assistance to keep from falling or unable to try Standing Unsupported, One Foot in Front: Loses balance while stepping or standing Standing on One Leg: Unable to try or needs assist to prevent fall Total Score: 12        Communication Communication Communication: No apparent difficulties  Cognition Arousal: Alert Behavior During Therapy: WFL for tasks assessed/performed   PT - Cognitive impairments: No apparent impairments                                Cueing Cueing Techniques: Verbal cues, Visual cues  Exercises Other Exercises Other Exercises: standing ex with walker support    General Comments General comments (skin integrity, edema, etc.): 12/56 - balance deficits along with pain barriers affect score      Pertinent Vitals/Pain Pain Assessment Pain Assessment: Faces Faces Pain Scale: Hurts little more Pain Location: low back Pain Descriptors / Indicators: Discomfort, Aching Pain Intervention(s): Limited activity within patient's tolerance, Monitored during session, Repositioned    Home Living                          Prior Function            PT Goals (current goals can now be found in the care plan section)      Frequency    Min 3X/week      PT Plan      Co-evaluation              AM-PAC PT 6 Clicks Mobility   Outcome Measure  Help needed turning from your back to your side while in a flat bed without using bedrails?: A Little Help needed moving from lying on your back to sitting  on the side of a flat bed without using bedrails?: A Little Help needed moving to and from a bed to a chair (including a wheelchair)?: A Little Help needed standing up from a chair using your arms (e.g., wheelchair or bedside chair)?: A Lot Help needed to walk in hospital room?: A Little Help needed climbing 3-5 steps with a railing? : A Lot 6 Click Score: 16    End of Session Equipment Utilized During Treatment: Gait belt Activity Tolerance: Patient tolerated treatment well Patient left: in chair;with call bell/phone within reach;with chair alarm set Nurse Communication: Mobility status PT Visit Diagnosis: Muscle weakness (generalized) (M62.81);Other abnormalities of gait and mobility (R26.89);Difficulty in walking, not elsewhere classified (R26.2);Pain     Time: 8888-8863 PT Time Calculation (min) (ACUTE ONLY): 25 min  Charges:    $Gait Training: 8-22 mins $Therapeutic Activity: 8-22 mins PT General Charges $$ ACUTE PT VISIT: 1 Visit  Lauraine Gills, PTA 07/24/24, 1:18 PM

## 2024-07-24 NOTE — Plan of Care (Signed)

## 2024-07-24 NOTE — Progress Notes (Signed)
 Informed doc about pt's left labia has a little bit of lesion it almost looks like a small ulcer. it bleeds a little. pt stated that she has not been having any sexual intercourse since last year.

## 2024-07-24 NOTE — TOC Progression Note (Signed)
 Transition of Care [facility] - Progression Note    Patient Details  Name: Sarah Duffy MRN: [id number] Date of Birth: 09/13/1971  Transition of Care [facility]) CM/SW Contact  Alvaro Louder, [place] Phone Number: 07/24/2024, 11:43 AM  Clinical Narrative: LCSWA met patient at bedside to discuss HH vs OP Rehab vs SNF, patient was agreeable with SNF [facility]. LCSWA contacted Photographer. Admin coordinator stated that insurance auth at their facility is pending. Coordinator stated that they have the patient slated for a Thursday Admission.   TOC to follow for discharge                       Expected Discharge Plan and Services                                               Social Drivers of Health (SDOH) Interventions SDOH Screenings   Food Insecurity: No Food Insecurity (07/20/2024)  Recent Concern: Food Insecurity - Food Insecurity Present (05/15/2024)   Received from [facility]  Housing: High Risk (07/20/2024)  Transportation Needs: No Transportation Needs (07/20/2024)  Recent Concern: Transportation Needs - Unmet Transportation Needs (05/15/2024)   Received from [facility]  Utilities: Not At Risk (07/20/2024)  Recent Concern: Utilities - At Risk (05/15/2024)   Received from [facility]  Alcohol Screen: Low Risk  (02/26/2023)  Depression (PHQ2-9): Low Risk  (07/05/2024)  Financial Resource Strain: High Risk (05/15/2024)   Received from [facility]  Physical Activity: Insufficiently Active (05/15/2024)   Received from [facility]  Social Connections: Unknown (07/20/2024)  Recent Concern: Social Connections - Moderately Isolated (05/15/2024)   Received from [facility]  Stress: Stress Concern Present (05/15/2024)   Received from [facility]  Tobacco Use: Low Risk  (07/19/2024)  Health Literacy: Adequate Health Literacy (05/15/2024)    Received from [facility]    Readmission Risk Interventions     No data to display

## 2024-07-24 NOTE — Progress Notes (Signed)
 Occupational Therapy Treatment Patient Details Name: Sarah Duffy MRN: [id number] DOB: 1971-01-11 Today's Date: 07/24/2024   History of present illness Pt is a 53 y/o F admitted on 07/19/24 after presenting with c/o worsening lower back pain, reports 2 falls this past week. Lumbar MRI showed moderate to severe canal stenosis with subarticular recess stenosis in L3-L4, L4-L5, L5-S1 levels. Neurosurgery consulted; pt not a candidate for surgical treatment. PMH: chronic lower back pain, HLD, MD, CAD, sCHF, CKD3A, migraine, depression, anxiety   OT comments  Chart reviewed to date, pt greeted in bathroom in the process of performing bathing. Pt is in agreement for OT intervention to improve ADL independence. Pt performs UB bathing with MIN A for thoroughness, LB with MOD A, especially for assist with BLE. Set up for UB dressing, MIN A for donning underwear, MAX A for socks. Multiple STS performed with CGA-MIN A. Pt amb aprox 15' with RW with CGA. Educated pt re: use of AE/DME for safer ADL completion. Pt is left in bed, all needs met. OT will continue to follow.       If plan is discharge home, recommend the following:  A little help with walking and/or transfers;A little help with bathing/dressing/bathroom;Help with stairs or ramp for entrance;Assistance with cooking/housework   Equipment Recommendations  BSC/3in1;Tub/shower seat    Recommendations for Other Services      Precautions / Restrictions Precautions Precautions: Fall Recall of Precautions/Restrictions: Intact Restrictions Weight Bearing Restrictions Per Provider Order: No       Mobility Bed Mobility Overal bed mobility: Needs Assistance Bed Mobility: Supine to Sit     Supine to sit: Max assist     General bed mobility comments: for BLE    Transfers Overall transfer level: Needs assistance Equipment used: Rolling walker (2 wheels) Transfers: Sit to/from Stand Sit to Stand: Min assist, Contact guard assist                  Balance Overall balance assessment: Needs assistance Sitting-balance support: Feet supported Sitting balance-Leahy Scale: Good     Standing balance support: Bilateral upper extremity supported Standing balance-Leahy Scale: Fair                             ADL either performed or assessed with clinical judgement   ADL Overall ADL's : Needs assistance/impaired     Grooming: Oral care;Wash/dry hands;Sitting;Set up Grooming Details (indicate cue type and reason): on bsc at sink Upper Body Bathing: Minimal assistance;Sitting   Lower Body Bathing: Moderate assistance;Sitting/lateral leans;Sit to/from stand Lower Body Bathing Details (indicate cue type and reason): for feet, pt able to wash upper/lower legs;    Upper body dressing: SET UP  Lower Body Dressing: Sit to/from stand;With adaptive equipment;Minimal assistance Lower Body Dressing Details (indicate cue type and reason): MAX A to donn socks, MIN A to thread underwear Toilet Transfer: Minimal assistance;Rolling walker (2 wheels);BSC/3in1 Toilet Transfer Details (indicate cue type and reason): during bathing         Functional mobility during ADLs: Contact guard assist;Rolling walker (2 wheels) (approx 15')      Extremity/Trunk Assessment              Vision       Perception     Praxis     Communication Communication Communication: No apparent difficulties   Cognition Arousal: Alert Behavior During Therapy: WFL for tasks assessed/performed Cognition: No apparent impairments  Following commands: Intact        Cueing   Cueing Techniques: Verbal cues, Visual cues  Exercises Other Exercises Other Exercises: edu re: role of OT, role of rehab, discharge recommendations, use of DME/AE for safer showering, dressing    Shoulder Instructions       General Comments red/pink discharge from peri area after bathing on towel that was placed on  bsc surface, nurse notified    Pertinent Vitals/ Pain       Pain Assessment Pain Assessment: Faces Faces Pain Scale: Hurts little more Pain Location: lower back Pain Descriptors / Indicators: Discomfort, Aching Pain Intervention(s): Monitored during session, Repositioned  Home Living                                          Prior Functioning/Environment              Frequency  Min 3X/week        Progress Toward Goals  OT Goals(current goals can now be found in the care plan section)  Progress towards OT goals: Progressing toward goals  Acute Rehab OT Goals Time For Goal Achievement: 08/03/24  Plan      Co-evaluation                 AM-PAC OT 6 Clicks Daily Activity     Outcome Measure   Help from another person eating meals?: None Help from another person taking care of personal grooming?: None Help from another person toileting, which includes using toliet, bedpan, or urinal?: A Little Help from another person bathing (including washing, rinsing, drying)?: A Little Help from another person to put on and taking off regular upper body clothing?: None Help from another person to put on and taking off regular lower body clothing?: A Little 6 Click Score: 21    End of Session Equipment Utilized During Treatment: Rolling walker (2 wheels)  OT Visit Diagnosis: Other abnormalities of gait and mobility (R26.89);Muscle weakness (generalized) (M62.81)   Activity Tolerance Patient tolerated treatment well   Patient Left with call bell/phone within reach;in bed   Nurse Communication Mobility status (discharge from peri area)        Time: 8566-8494 OT Time Calculation (min): 32 min  Charges: OT General Charges $OT Visit: 1 Visit OT Treatments $Self Care/Home Management : 23-37 mins  Therisa Sheffield, OTD OTR/L  07/24/24, 3:28 PM

## 2024-07-24 NOTE — Telephone Encounter (Signed)
 Pharmacy Patient Advocate Encounter  Received notification from [facility] that Prior Authorization for Emgality  120MG /ML auto-injectors (migraine) has been APPROVED from 07/24/24 to 07/24/25   PA #/Case ID/Reference #: [id number]

## 2024-07-25 DIAGNOSIS — M48061 Spinal stenosis, lumbar region without neurogenic claudication: Secondary | ICD-10-CM | POA: Diagnosis not present

## 2024-07-25 DIAGNOSIS — M48062 Spinal stenosis, lumbar region with neurogenic claudication: Secondary | ICD-10-CM | POA: Diagnosis not present

## 2024-07-25 LAB — BASIC METABOLIC PANEL WITH GFR
Anion gap: 4 — ABNORMAL LOW (ref 5–15)
BUN: 50 mg/dL — ABNORMAL HIGH (ref 6–20)
CO2: 24 mmol/L (ref 22–32)
Calcium: 9.3 mg/dL (ref 8.9–10.3)
Chloride: 108 mmol/L (ref 98–111)
Creatinine, Ser: 1.13 mg/dL — ABNORMAL HIGH (ref 0.44–1.00)
GFR, Estimated: 58 mL/min — ABNORMAL LOW (ref >60)
Glucose, Bld: 94 mg/dL (ref 70–99)
Potassium: 4.8 mmol/L (ref 3.5–5.1)
Sodium: 136 mmol/L (ref 135–145)

## 2024-07-25 LAB — CBC
HCT: 29.6 % — ABNORMAL LOW (ref 36.0–46.0)
Hemoglobin: 9.7 g/dL — ABNORMAL LOW (ref 12.0–15.0)
MCH: 28.9 pg (ref 26.0–34.0)
MCHC: 32.8 g/dL (ref 30.0–36.0)
MCV: 88.1 fL (ref 80.0–100.0)
Platelets: 212 K/uL (ref 150–400)
RBC: 3.36 MIL/uL — ABNORMAL LOW (ref 3.87–5.11)
RDW: 13 % (ref 11.5–15.5)
WBC: 5.9 K/uL (ref 4.0–10.5)
nRBC: 0 % (ref 0.0–0.2)

## 2024-07-25 LAB — GLUCOSE, CAPILLARY
Glucose-Capillary: 95 mg/dL (ref 70–99)
Glucose-Capillary: 238 mg/dL — ABNORMAL HIGH (ref 70–99)
Glucose-Capillary: 139 mg/dL — ABNORMAL HIGH (ref 70–99)
Glucose-Capillary: 102 mg/dL — ABNORMAL HIGH (ref 70–99)

## 2024-07-25 NOTE — Progress Notes (Signed)
 Physical Therapy Treatment Patient Details Name: Sarah Duffy MRN: [id number] DOB: 1971/03/08 Today's Date: 07/25/2024   History of Present Illness Pt is a 53 y/o F admitted on 07/19/24 after presenting with c/o worsening lower back pain, reports 2 falls this past week. Lumbar MRI showed moderate to severe canal stenosis with subarticular recess stenosis in L3-L4, L4-L5, L5-S1 levels. Neurosurgery consulted; pt not a candidate for surgical treatment. PMH: chronic lower back pain, HLD, MD, CAD, sCHF, CKD3A, migraine, depression, anxiety    PT Comments  Pt in bathroom finishing her bath.  Assisted with sock.  She is able stand and walk x 2 laps with heavy RW support.  Seated rest in room where she focused on 10x sit to stand with improving awareness of positioning to make transition easier.  She walks an additional lap at end of session.   Pt keeps L LE externally rotated to about 80 degrees at hip during gait.  When questioned she stated she feels more stable this way.  [place] he has been to podiatry and they said it was from her neuropathy.     If plan is discharge home, recommend the following: A little help with bathing/dressing/bathroom;Assistance with cooking/housework;Direct supervision/assist for medications management;Assist for transportation;Help with stairs or ramp for entrance;A little help with walking and/or transfers   Can travel by private vehicle        Equipment Recommendations  Rolling walker (2 wheels);BSC/3in1    Recommendations for Other Services       Precautions / Restrictions Precautions Precautions: Fall Recall of Precautions/Restrictions: Intact Restrictions Weight Bearing Restrictions Per Provider Order: No     Mobility  Bed Mobility               General bed mobility comments: in chair before and after Patient Response: Cooperative  Transfers Overall transfer level: Needs assistance Equipment used: Rolling walker (2 wheels) Transfers: Sit  to/from Stand Sit to Stand: Min assist, Contact guard assist           General transfer comment: continues to need +1 for safety    Ambulation/Gait Ambulation/Gait assistance: Contact guard assist Gait Distance (Feet): 300 Feet Assistive device: Rolling walker (2 wheels) Gait Pattern/deviations: Step-to pattern, Decreased dorsiflexion - right, Decreased dorsiflexion - left, Knees buckling Gait velocity: decreased     General Gait Details: heavy reliance on RW and geneally unsteady with inc fall risk   Stairs             Wheelchair Mobility     Tilt Bed Tilt Bed Patient Response: Cooperative  Modified Rankin (Stroke Patients Only)       Balance Overall balance assessment: Needs assistance Sitting-balance support: Feet supported Sitting balance-Leahy Scale: Good     Standing balance support: Bilateral upper extremity supported Standing balance-Leahy Scale: Fair Standing balance comment: Pt requiring RW for standing balance secondary to LE weakness                            Communication Communication Communication: No apparent difficulties  Cognition Arousal: Alert Behavior During Therapy: WFL for tasks assessed/performed   PT - Cognitive impairments: No apparent impairments                         Following commands: Intact      Cueing Cueing Techniques: Verbal cues, Visual cues  Exercises Other Exercises Other Exercises: assisted with finishing bathing , 10 x sit to stand,  standing ex with walker support    General Comments        Pertinent Vitals/Pain Pain Assessment Pain Assessment: Faces Faces Pain Scale: Hurts little more Pain Location: lower back Pain Descriptors / Indicators: Discomfort, Aching Pain Intervention(s): Limited activity within patient's tolerance, Monitored during session, Repositioned    Home Living                          Prior Function            PT Goals (current goals can  now be found in the care plan section) Progress towards PT goals: Progressing toward goals    Frequency    Min 3X/week      PT Plan      Co-evaluation              AM-PAC PT 6 Clicks Mobility   Outcome Measure  Help needed turning from your back to your side while in a flat bed without using bedrails?: A Little Help needed moving from lying on your back to sitting on the side of a flat bed without using bedrails?: A Little Help needed moving to and from a bed to a chair (including a wheelchair)?: A Little Help needed standing up from a chair using your arms (e.g., wheelchair or bedside chair)?: A Lot Help needed to walk in hospital room?: A Little Help needed climbing 3-5 steps with a railing? : A Lot 6 Click Score: 16    End of Session Equipment Utilized During Treatment: Gait belt Activity Tolerance: Patient tolerated treatment well Patient left: in chair;with call bell/phone within reach;with chair alarm set Nurse Communication: Mobility status PT Visit Diagnosis: Muscle weakness (generalized) (M62.81);Other abnormalities of gait and mobility (R26.89);Difficulty in walking, not elsewhere classified (R26.2);Pain     Time: 1025-1051 PT Time Calculation (min) (ACUTE ONLY): 26 min  Charges:    $Gait Training: 8-22 mins $Therapeutic Activity: 8-22 mins PT General Charges $$ ACUTE PT VISIT: 1 Visit                   Lauraine Gills, PTA 07/25/24, 11:41 AM

## 2024-07-25 NOTE — Plan of Care (Signed)
  Problem: Coping: Goal: Ability to adjust to condition or change in health will improve Outcome: Progressing   Problem: Health Behavior/Discharge Planning: Goal: Ability to manage health-related needs will improve Outcome: Progressing   Problem: Metabolic: Goal: Ability to maintain appropriate glucose levels will improve Outcome: Progressing

## 2024-07-25 NOTE — Progress Notes (Signed)
 Occupational Therapy Treatment Patient Details Name: Sarah Duffy MRN: [id number] DOB: 1971/01/20 Today's Date: 07/25/2024   History of present illness Pt is a 53 y/o F admitted on 07/19/24 after presenting with c/o worsening lower back pain, reports 2 falls this past week. Lumbar MRI showed moderate to severe canal stenosis with subarticular recess stenosis in L3-L4, L4-L5, L5-S1 levels. Neurosurgery consulted; pt not a candidate for surgical treatment. PMH: chronic lower back pain, HLD, MD, CAD, sCHF, CKD3A, migraine, depression, anxiety   OT comments  Pt seen for OT tx. Pt endorsing 8/10 pain but already made a plan with nursing for pain medication at future time and pt agreeable to session despite the pain. Pt required MIN A to stand from elevated bed 2/2 pain. Pt educated in additional ECS to support ADL/IADL/mobility safety and independence with handout provided to support recall and carryover. Pt ambulated in hall with RW and requiring CGA while engaging in problem solving. Pt required cues to help identify optimal choices to support better balance between activity and rest and prioritizing meaningful activities. Pt progressing towards goals. Continues to be limited in independence with ADL transfers and exertional activity 2/2 significant back pain. Will continue to progress.       If plan is discharge home, recommend the following:  A little help with walking and/or transfers;A little help with bathing/dressing/bathroom;Help with stairs or ramp for entrance;Assistance with cooking/housework   Equipment Recommendations  BSC/3in1;Tub/shower seat    Recommendations for Other Services      Precautions / Restrictions Precautions Precautions: Fall Recall of Precautions/Restrictions: Intact Restrictions Weight Bearing Restrictions Per Provider Order: No       Mobility Bed Mobility Overal bed mobility: Needs Assistance Bed Mobility: Rolling, Sidelying to Sit, Sit to  Sidelying Rolling: Modified independent (Device/Increase time) Sidelying to sit: Supervision, HOB elevated, Used rails     Sit to sidelying: Min assist, Mod assist General bed mobility comments: BLE mgt back to bed with cues for log rolling    Transfers Overall transfer level: Needs assistance Equipment used: Rolling walker (2 wheels) Transfers: Sit to/from Stand Sit to Stand: Min assist, From elevated surface                 Balance Overall balance assessment: Needs assistance Sitting-balance support: Feet supported Sitting balance-Leahy Scale: Good     Standing balance support: Bilateral upper extremity supported Standing balance-Leahy Scale: Fair Standing balance comment: Pt requiring RW for standing balance secondary to LE weakness                           ADL either performed or assessed with clinical judgement   ADL                                              Extremity/Trunk Assessment              Vision       Perception     Praxis     Communication     Cognition Arousal: Alert Behavior During Therapy: WFL for tasks assessed/performed Cognition: No apparent impairments                               Following commands: Intact        Cueing  Cueing Techniques: Verbal cues, Visual cues  Exercises Other Exercises Other Exercises: Pt educated in additional ECS to support ADL/IADL/mobility safety and independence with handout provided to support recall and carryover. Pt ambulated in hall with RW and requiring CGA while engaging in problem solving. Pt required cues to help identify optimal choices to support better balance between activity and rest and prioritizing meaningful activities.    Shoulder Instructions       General Comments      Pertinent Vitals/ Pain       Pain Assessment Pain Assessment: 0-10 Pain Score: 8  Pain Location: lower back Pain Descriptors / Indicators: Discomfort,  Aching Pain Intervention(s): Limited activity within patient's tolerance, Monitored during session, Repositioned (reports getting pain medication soon)  Home Living                                          Prior Functioning/Environment              Frequency  Min 3X/week        Progress Toward Goals  OT Goals(current goals can now be found in the care plan section)  Progress towards OT goals: Progressing toward goals  Acute Rehab OT Goals Patient Stated Goal: improve function OT Goal Formulation: With patient Time For Goal Achievement: 08/03/24 Potential to Achieve Goals: Good  Plan      Co-evaluation                 AM-PAC OT 6 Clicks Daily Activity     Outcome Measure   Help from another person eating meals?: None Help from another person taking care of personal grooming?: None Help from another person toileting, which includes using toliet, bedpan, or urinal?: A Little Help from another person bathing (including washing, rinsing, drying)?: A Little Help from another person to put on and taking off regular upper body clothing?: None Help from another person to put on and taking off regular lower body clothing?: A Little 6 Click Score: 21    End of Session Equipment Utilized During Treatment: Rolling walker (2 wheels);Gait belt  OT Visit Diagnosis: Other abnormalities of gait and mobility (R26.89);Muscle weakness (generalized) (M62.81)   Activity Tolerance Patient tolerated treatment well   Patient Left in bed;with call bell/phone within reach;with bed alarm set;with nursing/sitter in room;with family/visitor present   Nurse Communication          Time: 8453-8398 OT Time Calculation (min): 15 min  Charges: OT General Charges $OT Visit: 1 Visit OT Treatments $Therapeutic Activity: 8-22 mins  Warren SAUNDERS., MPH, MS, OTR/L ascom [phone] 07/25/24, 4:43 PM

## 2024-07-25 NOTE — Progress Notes (Signed)
 Occupational Therapy Treatment Patient Details Name: Sarah Duffy MRN: [id number] DOB: [birth year] Today's Date: 07/25/2024   History of present illness Pt is a 53 y/o F admitted on 07/19/24 after presenting with c/o worsening lower back pain, reports 2 falls this past week. Lumbar MRI showed moderate to severe canal stenosis with subarticular recess stenosis in L3-L4, L4-L5, L5-S1 levels. Neurosurgery consulted; pt not a candidate for surgical treatment. PMH: chronic lower back pain, HLD, MD, CAD, sCHF, CKD3A, migraine, depression, anxiety   OT comments  Pt seen for OT tx. Pt endorsing 8/10 pain but already made a plan with nursing for pain medication at future time and pt agreeable to session despite the pain. Pt required MIN A to stand from elevated bed 2/2 pain. Pt educated in additional ECS to support ADL/IADL/mobility safety and independence with handout provided to support recall and carryover. Pt ambulated in hall with RW and requiring CGA while engaging in problem solving. Pt required cues to help identify optimal choices to support better balance between activity and rest and prioritizing meaningful activities. Pt progressing towards goals. Continues to be limited in independence with ADL transfers and exertional activity 2/2 significant back pain. Will continue to progress.       If plan is discharge home, recommend the following:  A little help with walking and/or transfers;A little help with bathing/dressing/bathroom;Help with stairs or ramp for entrance;Assistance with cooking/housework   Equipment Recommendations  BSC/3in1;Tub/shower seat    Recommendations for Other Services      Precautions / Restrictions Precautions Precautions: Fall Recall of Precautions/Restrictions: Intact Restrictions Weight Bearing Restrictions Per Provider Order: No       Mobility Bed Mobility Overal bed mobility: Needs Assistance Bed Mobility: Rolling, Sidelying to Sit, Sit to  Sidelying Rolling: Modified independent (Device/Increase time) Sidelying to sit: Supervision, HOB elevated, Used rails     Sit to sidelying: Min assist, Mod assist General bed mobility comments: BLE mgt back to bed with cues for log rolling    Transfers Overall transfer level: Needs assistance Equipment used: Rolling walker (2 wheels) Transfers: Sit to/from Stand Sit to Stand: Min assist, From elevated surface                 Balance Overall balance assessment: Needs assistance Sitting-balance support: Feet supported Sitting balance-Leahy Scale: Good     Standing balance support: Bilateral upper extremity supported Standing balance-Leahy Scale: Fair Standing balance comment: Pt requiring RW for standing balance secondary to LE weakness                           ADL either performed or assessed with clinical judgement   ADL                                              Extremity/Trunk Assessment              Vision       Perception     Praxis     Communication     Cognition Arousal: Alert Behavior During Therapy: WFL for tasks assessed/performed Cognition: No apparent impairments                               Following commands: Intact        Cueing  Cueing Techniques: Verbal cues, Visual cues  Exercises Other Exercises Other Exercises: Pt educated in additional ECS to support ADL/IADL/mobility safety and independence with handout provided to support recall and carryover. Pt ambulated in hall with RW and requiring CGA while engaging in problem solving. Pt required cues to help identify optimal choices to support better balance between activity and rest and prioritizing meaningful activities.    Shoulder Instructions       General Comments      Pertinent Vitals/ Pain       Pain Assessment Pain Assessment: 0-10 Pain Score: 8  Pain Location: lower back Pain Descriptors / Indicators: Discomfort,  Aching Pain Intervention(s): Limited activity within patient's tolerance, Monitored during session, Repositioned (reports getting pain medication soon)  Home Living                                          Prior Functioning/Environment              Frequency  Min 3X/week        Progress Toward Goals  OT Goals(current goals can now be found in the care plan section)  Progress towards OT goals: Progressing toward goals  Acute Rehab OT Goals Patient Stated Goal: improve function OT Goal Formulation: With patient Time For Goal Achievement: 08/03/24 Potential to Achieve Goals: Good  Plan      Co-evaluation                 AM-PAC OT 6 Clicks Daily Activity     Outcome Measure   Help from another person eating meals?: None Help from another person taking care of personal grooming?: None Help from another person toileting, which includes using toliet, bedpan, or urinal?: A Little Help from another person bathing (including washing, rinsing, drying)?: A Little Help from another person to put on and taking off regular upper body clothing?: None Help from another person to put on and taking off regular lower body clothing?: A Little 6 Click Score: 21    End of Session Equipment Utilized During Treatment: Rolling walker (2 wheels);Gait belt  OT Visit Diagnosis: Other abnormalities of gait and mobility (R26.89);Muscle weakness (generalized) (M62.81)   Activity Tolerance Patient tolerated treatment well   Patient Left in bed;with call bell/phone within reach;with bed alarm set;with nursing/sitter in room;with family/visitor present   Nurse Communication          Time: 8453-8398 OT Time Calculation (min): 15 min  Charges: OT General Charges $OT Visit: 1 Visit OT Treatments $Therapeutic Activity: 8-22 mins  Warren SAUNDERS., MPH, MS, OTR/L ascom [phone] 07/25/24, 4:43 PM

## 2024-07-25 NOTE — Progress Notes (Addendum)
 PROGRESS NOTE    Sarah Duffy  FMW:[id number] DOB: 02-26-1971 DOA: 07/19/2024 PCP: [name], Krichna, MD  Chief Complaint  Patient presents with   Back Pain    Hospital Course:  Lashonne Shull is a 53 y.o. female with medical history significant of chronic lower back pain, HLD, MD, CAD, sCHF with EF 49%, CKD-3a, migraine, depression with anxiety presented with worsening lower back pain, radiating to posterior legs asso with leg weakness and gait instability, no bladder/bowel incontinence, had falls. Has chronic LBP since March, follows up at [facility] pain clinic and getting steroid injections (ESI) every 3 months, recent one delayed > 1 month.   Seen by neurosurgery, MRI shows moderate to severe canal stenosis with severe subarticular recess stenosis at L3-4, L4-5, L5-S1. Not a surgical candidate at this time due to uncontrolled DM. Pain control and discharge to SNF when place available.  TOC working on SNF placement, anticipate bed availability on Thursday 08/14, lives alone  Subjective: Seen at bedside,  States had symptoms slowly improving Awaiting SNF placement   Objective: Vitals:   07/25/24 0213 07/25/24 0500 07/25/24 0727 07/25/24 1602  BP: (!) 109/53  118/60 139/64  Pulse: 76  83 80  Resp: 18  16 16   Temp: 98.2 F (36.8 C)  98.4 F (36.9 C) 98 F (36.7 C)  TempSrc: Oral  Oral Oral  SpO2: 99%  100% 100%  Weight:  86.5 kg    Height:        Intake/Output Summary (Last 24 hours) at 07/25/2024 1604 Last data filed at 07/25/2024 1300 Gross per 24 hour  Intake 840 ml  Output --  Net 840 ml   Filed Weights   07/22/24 0500 07/23/24 0500 07/25/24 0500  Weight: 36.2 kg 85.7 kg 86.5 kg    Examination: General: Not in acute distress Cardiac: S1/S2, RRR, no gallops or rubs Respiratory: No rales, wheezing, rhonchi or rubs GI: Soft, nondistended, nontender, no rebound pain, no organomegaly, BS present GU: No hematuria Ext: has trace leg edema bilaterally. 1+DP/PT pulse  bilaterally. Musculoskeletal: No midline tenderness Skin: No rashes.  Neuro: Alert, oriented X3, cranial nerves II-XII grossly intact, moves all extremities normally. Muscle strength 4/5 in both legs, and 5/5 in arms. Sensation to light touch intact.   Assessment & Plan:  Principal Problem:   Lumbar spinal stenosis Active Problems:   Fall at home, initial encounter   Coronary artery disease   Hypertension   Chronic combined systolic and diastolic CHF (congestive heart failure) (HCC)   Hyperlipidemia LDL goal <70   Type II diabetes mellitus with renal manifestations (HCC)   Asthma, well controlled   Chronic kidney disease, stage 3a (HCC)   Chronic migraine w/o aura, not intractable, w/o stat migr   Overweight (BMI 25.0-29.9)   Cervical myelopathy (HCC)   Impaired gait and mobility  Lumbar spinal stenosis with neurogenic claudication - Patient has severe pain, leg weakness and gait instability, lives alone at home - MRI of lumbar spine showed moderate to severe canal stenosis with subarticular recess stenosis in L3-L4, L4-L5, L5-S1 levels - Seen by Neurosurgery, appreciate recs. Not an appropriate candidate for surgical intervention due to poor DM control - Multimodal pain control - Tylenol , Lidoderm  patch, Robaxin  prn, Gabapentin . Avoid NSAID's due to CKD. Minimize opioids - Tramadol  prn - Discontinue prednisone  - PT/OT rec SNF, pending placement - Has appt with [facility] 08/13 - Follow up outpatient for surgical intervention once HbA1c controlled   Fall at home, initial encounter: Reports head injury. -  Fall precaution - PT/OT rec SNF - CT Head negative   Coronary artery disease: No chest pain -Continue aspirin , Lipitor, Zetia  - As needed nitroglycerin    Hypertension -IV hydralazine  as needed - Patient is on spironolactone , amlodipine  and Entresto    Chronic combined systolic and diastolic CHF (congestive heart failure) (HCC): 2D echo on 04/02/2024 showed EF of 49% with  grade 1 diastolic dysfunction.  Patient has trace leg edema, no SOB, no JVD, CHF seem to be compensated. -Continue home spironolactone , Entresto , Coreg    Hyperlipidemia LDL goal <70 -Lipitor, Zetia    Type II diabetes mellitus with renal manifestations [facility]):  Recent A1c 10.4, poorly controlled (08/25).  Patient taking NovoLog , Jardiance , Ozempic  and glargine insulin  70 unit nightly - [employer] Novolog  10u TID with meals - On Glargine insulin  60 units daily - Follows up with Endocrinology outpatient   Asthma, well controlled: Stable - as needed Mucinex    Chronic kidney disease, stage 3a (HCC) - Cr baseline 1.1-1.2   Chronic migraine w/o aura, not intractable, w/o stat migraine:  - Patient is on Emgality  injection every month for prophylaxis.  Right labial lesion - not sexually active > 1 year now, denies any vaginal discharge but states has foul odor - send Chlamydia/Gonorrhea testing - Gyn outpatient referral   Overweight (BMI 25.0-29.9): Body weight 86.2 kg, BMI 28.04 - Encourage losing weight - Exercise and healthy diet  DVT prophylaxis: SQ Lovenox    Code Status: Full Code Disposition:  SNF - awaiting placement  Consultants:  None  Procedures:  None  Antimicrobials:  Anti-infectives (From admission, onward)    None       Data Reviewed: I have personally reviewed following labs and imaging studies CBC: Recent Labs  Lab 07/20/24 0040 07/25/24 0416  WBC 6.2 5.9  HGB 10.1* 9.7*  HCT 30.5* 29.6*  MCV 87.1 88.1  PLT 200 212   Basic Metabolic Panel: Recent Labs  Lab 07/20/24 0040 07/21/24 0211 07/25/24 0416  NA 139 138 136  K 5.0 4.6 4.8  CL 107 105 108  CO2 23 25 24   GLUCOSE 286* 160* 94  BUN 34* 44* 50*  CREATININE 1.28* 1.19* 1.13*  CALCIUM  9.1 9.5 9.3   GFR: Estimated Creatinine Clearance: 66.8 mL/min (A) (by C-G formula based on SCr of 1.13 mg/dL (H)). Liver Function Tests: No results for input(s): AST, ALT, ALKPHOS, BILITOT,  PROT, ALBUMIN in the last 168 hours. CBG: Recent Labs  Lab 07/24/24 1203 07/24/24 1711 07/24/24 2108 07/25/24 0725 07/25/24 1140  GLUCAP 71 192* 193* 95 238*    No results found for this or any previous visit (from the past 240 hours).   Radiology Studies: No results found.   Scheduled Meds:  amLODipine   5 mg Oral QHS   aspirin  EC  81 mg Oral QHS   atorvastatin   80 mg Oral Daily   carvedilol   12.5 mg Oral BID WC   enoxaparin  (LOVENOX ) injection  40 mg Subcutaneous Q24H   ezetimibe   10 mg Oral Daily   gabapentin   800 mg Oral TID   insulin  aspart  0-5 Units Subcutaneous QHS   insulin  aspart  0-9 Units Subcutaneous TID WC   insulin  aspart  10 Units Subcutaneous TID with meals   insulin  glargine-yfgn  60 Units Subcutaneous QHS   lidocaine   1 patch Transdermal QHS   sacubitril -valsartan   1 tablet Oral BID   spironolactone   25 mg Oral Daily   Continuous Infusions:   LOS: 0 days  MDM: Patient is high risk  for one or more organ failure.  They necessitate ongoing hospitalization for continued IV therapies and subsequent lab monitoring. Total time spent interpreting labs and vitals, reviewing the medical record, coordinating care amongst consultants and care team members, directly assessing and discussing care with the patient and/or family: 35 min Laree Lock, MD [facility]  To contact the attending physician between 7A-7P please use Epic Chat. To contact the covering physician during after hours 7P-7A, please review Amion.  07/25/2024, 4:04 PM   *This document has been created with the assistance of dictation software. Please excuse typographical errors. *

## 2024-07-26 ENCOUNTER — Other Ambulatory Visit (HOSPITAL_COMMUNITY): Payer: Self-pay

## 2024-07-26 DIAGNOSIS — M48061 Spinal stenosis, lumbar region without neurogenic claudication: Secondary | ICD-10-CM | POA: Diagnosis not present

## 2024-07-26 LAB — CHLAMYDIA/NGC RT PCR (ARMC ONLY)
Chlamydia Tr: NOT DETECTED
N gonorrhoeae: NOT DETECTED

## 2024-07-26 LAB — GLUCOSE, CAPILLARY
Glucose-Capillary: 173 mg/dL — ABNORMAL HIGH (ref 70–99)
Glucose-Capillary: 87 mg/dL (ref 70–99)
Glucose-Capillary: 290 mg/dL — ABNORMAL HIGH (ref 70–99)
Glucose-Capillary: 156 mg/dL — ABNORMAL HIGH (ref 70–99)

## 2024-07-26 NOTE — Plan of Care (Signed)
  Problem: Nutritional: Goal: Maintenance of adequate nutrition will improve Outcome: Progressing Goal: Progress toward achieving an optimal weight will improve Outcome: Progressing   Problem: Tissue Perfusion: Goal: Adequacy of tissue perfusion will improve Outcome: Progressing   Problem: Education: Goal: Knowledge of General Education information will improve Description: Including pain rating scale, medication(s)/side effects and non-pharmacologic comfort measures Outcome: Progressing

## 2024-07-26 NOTE — Progress Notes (Signed)
 Physical Therapy Treatment Patient Details Name: Sarah Duffy MRN: [id number] DOB: [birth year] Today's Date: 07/26/2024   History of Present Illness Pt is a 53 y/o F admitted on 07/19/24 after presenting with c/o worsening lower back pain, reports 2 falls this past week. Lumbar MRI showed moderate to severe canal stenosis with subarticular recess stenosis in L3-L4, L4-L5, L5-S1 levels. Neurosurgery consulted; pt not a candidate for surgical treatment at this time. PMH: chronic lower back pain, HLD, MD, CAD, sCHF, CKD3A, migraine, depression, anxiety    PT Comments  Pt pleasant and motivated, still significantly limited due to generalized weakness, no notedly in b/l quads, L ankle, b/l triceps.  Pt with very little oomph to get up from standard height recliner and though she was able to do reasonably well ambulating on flat surface with (and briefly w/o) walker she struggled a lot of mobility and especially stair negotiation.  Pt continues to have functional limitations that require further PT intervention, continue with POC.     If plan is discharge home, recommend the following: A little help with bathing/dressing/bathroom;Assistance with cooking/housework;Direct supervision/assist for medications management;Assist for transportation;Help with stairs or ramp for entrance;A little help with walking and/or transfers   Can travel by private vehicle     Yes  Equipment Recommendations  Rolling walker (2 wheels);BSC/3in1    Recommendations for Other Services       Precautions / Restrictions Precautions Precautions: Fall Recall of Precautions/Restrictions: Intact Restrictions Weight Bearing Restrictions Per Provider Order: No     Mobility  Bed Mobility               General bed mobility comments: in recliner pre/post session    Transfers Overall transfer level: Needs assistance Equipment used: Rolling walker (2 wheels) Transfers: Sit to/from Stand Sit to Stand: Min assist,  From elevated surface           General transfer comment: Pt with significant weakness in quads and triceps making transition to standing more of a physics/weight shift effort than purely phyiscal mechanics.  She was unable to attain standing from standard height recliner on first 2 attempts w/o direct assist.  After heavy cuing for forward lean and sequencing of positions she did attain standing with CGA and only very light assist to shift weight forward over feet and attain upright in walker    Ambulation/Gait Ambulation/Gait assistance: Contact guard assist Gait Distance (Feet): 250 Feet Assistive device: Rolling walker (2 wheels)         General Gait Details: Pt displays poor overall ability to safely and confidently control LEs during ambulation despite being about to ambulate >200 ft with rest break. L LE maintained in significant ER and with poor mechanics (did improve some with cuing) and poor confidence with weight acceptance.  She did manage to go ~20 ft w/o UE support but with slower and very guarded effort close to the rail in the hallway.  Pt with no overt buckling but clearly relying on skeletal alignment rather than muscular control t/o much of the effort.   Stairs Stairs: Yes Stairs assistance: Mod assist Stair Management: No rails, One rail Left Number of Stairs: 4 General stair comments: Pt showed great effort with stair negotiation, but ultimately was very limited and displayed some safety issues.  On first attempt with single L rail (pt has no rails at home) she had to lean forearm/elbows on the rail, struggled to get R LE up a single step with heavy lateral lean to do so, then  leverage her L side up enough to attain the step with poor confidence.  Pt did descend the steps (requiring less quad control) with much impoved efficiency and confidence.  Attempts at retro negotiation with walker (no rails) was unsafe and after just one steps pt and PT agreed that she is not  able/ready to safey attempt at this time.   Wheelchair Mobility     Tilt Bed    Modified Rankin (Stroke Patients Only)       Balance Overall balance assessment: Needs assistance Sitting-balance support: Feet supported Sitting balance-Leahy Scale: Good     Standing balance support: Bilateral upper extremity supported Standing balance-Leahy Scale: Fair Standing balance comment: poor confidence and exectuaion with standing balance secondary to LE weakness                            Communication Communication Communication: No apparent difficulties  Cognition Arousal: Alert Behavior During Therapy: WFL for tasks assessed/performed   PT - Cognitive impairments: No apparent impairments                       PT - Cognition Comments: Pt is pleasant and agreeable t/o PT session Following commands: Intact      Cueing Cueing Techniques: Verbal cues, Visual cues  Exercises      General Comments        Pertinent Vitals/Pain Pain Assessment Pain Score: 5  Pain Location: lower back    Home Living                          Prior Function            PT Goals (current goals can now be found in the care plan section) Progress towards PT goals: Progressing toward goals    Frequency    Min 2-3X/week      PT Plan      Co-evaluation              AM-PAC PT 6 Clicks Mobility   Outcome Measure  Help needed turning from your back to your side while in a flat bed without using bedrails?: A Little Help needed moving from lying on your back to sitting on the side of a flat bed without using bedrails?: A Little Help needed moving to and from a bed to a chair (including a wheelchair)?: A Little Help needed standing up from a chair using your arms (e.g., wheelchair or bedside chair)?: A Lot Help needed to walk in hospital room?: A Little Help needed climbing 3-5 steps with a railing? : A Lot 6 Click Score: 16    End of Session  Equipment Utilized During Treatment: Gait belt Activity Tolerance: Patient tolerated treatment well Patient left: in chair;with call bell/phone within reach;with chair alarm set Nurse Communication: Mobility status PT Visit Diagnosis: Muscle weakness (generalized) (M62.81);Other abnormalities of gait and mobility (R26.89);Difficulty in walking, not elsewhere classified (R26.2);Pain Pain - part of body:  (lumbago)     Time: 9040-8951 PT Time Calculation (min) (ACUTE ONLY): 49 min  Charges:    $Gait Training: 23-37 mins $Therapeutic Exercise: 8-22 mins PT General Charges $$ ACUTE PT VISIT: 1 Visit                     Carmin JONELLE Deed, DPT 07/26/2024, 12:48 PM

## 2024-07-26 NOTE — Progress Notes (Signed)
 PROGRESS NOTE    Sarah Duffy  FMW:[id number] DOB: 1971-08-29 DOA: 07/19/2024 PCP: [name], Krichna, MD  Chief Complaint  Patient presents with   Back Pain    Hospital Course:  Sarah Duffy is a 53 y.o. female with medical history significant of chronic lower back pain, HLD, MD, CAD, sCHF with EF 49%, CKD-3a, migraine, depression with anxiety presented with worsening lower back pain, radiating to posterior legs asso with leg weakness and gait instability, no bladder/bowel incontinence, had falls. Has chronic LBP since March, follows up at [facility] pain clinic and getting steroid injections (ESI) every 3 months, recent one delayed > 1 month.   Seen by neurosurgery, MRI shows moderate to severe canal stenosis with severe subarticular recess stenosis at L3-4, L4-5, L5-S1. Not a surgical candidate at this time due to uncontrolled DM. Pain control and discharge to SNF when place available.  TOC working on SNF placement, anticipate bed availability on Thursday 08/14, lives alone  Subjective: Seen at bedside,  stable low back pain   Objective: Vitals:   07/25/24 2232 07/26/24 0316 07/26/24 0500 07/26/24 0732  BP: 115/67 (!) 113/56  (!) 105/91  Pulse:  86  91  Resp:  16  16  Temp:  98.1 F (36.7 C)  98.1 F (36.7 C)  TempSrc:  Oral  Oral  SpO2:  98%  100%  Weight:   87.8 kg   Height:        Intake/Output Summary (Last 24 hours) at 07/26/2024 1433 Last data filed at 07/26/2024 1004 Gross per 24 hour  Intake 480 ml  Output --  Net 480 ml   Filed Weights   07/23/24 0500 07/25/24 0500 07/26/24 0500  Weight: 85.7 kg 86.5 kg 87.8 kg    Examination: General: Not in acute distress Cardiac: S1/S2, RRR  Respiratory: ctab GI: Soft, nondistended, nontender,  Ext: has trace leg edema bilaterally. warm Skin: No rashes.  Neuro: Alert, oriented X3,  symmetric LE strength, distal sensation intact  Assessment & Plan:  Principal Problem:   Lumbar spinal stenosis Active Problems:   Fall at  home, initial encounter   Coronary artery disease   Hypertension   Chronic combined systolic and diastolic CHF (congestive heart failure) (HCC)   Hyperlipidemia LDL goal <70   Type II diabetes mellitus with renal manifestations (HCC)   Asthma, well controlled   Chronic kidney disease, stage 3a (HCC)   Chronic migraine w/o aura, not intractable, w/o stat migr   Overweight (BMI 25.0-29.9)   Cervical myelopathy (HCC)   Impaired gait and mobility  Lumbar spinal stenosis with neurogenic claudication - Patient has severe pain, leg weakness and gait instability, lives alone at home - MRI of lumbar spine showed moderate to severe canal stenosis with subarticular recess stenosis in L3-L4, L4-L5, L5-S1 levels - Seen by Neurosurgery, appreciate recs. Not an appropriate candidate for surgical intervention for the time being, advise outpt f/u - Multimodal pain control - Tylenol , Lidoderm  patch, Robaxin  prn, Gabapentin . Avoid NSAID's due to CKD. Minimize opioids - Tramadol  prn - Discontinued prednisone  - PT/OT rec SNF, pending placement. Has bed can go tomorrow - follows w/ dr. Claudene outpt   Fall at home, initial encounter: Reports head injury. - Fall precaution - PT/OT rec SNF - CT Head negative   Coronary artery disease: No chest pain -Continue aspirin , Lipitor, Zetia  - As needed nitroglycerin    Hypertension -IV hydralazine  as needed - Patient is on spironolactone , amlodipine  and Entresto    Chronic combined systolic and diastolic CHF (congestive  heart failure) (HCC): 2D echo on 04/02/2024 showed EF of 49% with grade 1 diastolic dysfunction.  Patient has trace leg edema, no SOB, no JVD, CHF seem to be compensated. -Continue home spironolactone , Entresto , Coreg    Hyperlipidemia LDL goal <70 -Lipitor, Zetia    Type II diabetes mellitus with renal manifestations [facility]):  Recent A1c 10.4, poorly controlled (08/25).  Patient taking NovoLog , Jardiance , Ozempic  and glargine insulin  70 unit  nightly - [employer] Novolog  10u TID with meals - On Glargine insulin  60 units daily - Follows up with Endocrinology outpatient   Asthma, well controlled: Stable - as needed Mucinex    Chronic kidney disease, stage 3a (HCC) - Cr baseline 1.1-1.2, stable   Chronic migraine w/o aura, not intractable, w/o stat migraine:  - Patient is on Emgality  injection every month for prophylaxis.  Right labial lesion - not sexually active > 1 year now, denies any vaginal discharge but states has foul odor - evaluated by prior provider - Chlamydia/Gonorrhea negative - Gyn or pcp outpatient f/u   Overweight (BMI 25.0-29.9): Body weight 86.2 kg, BMI 28.04 - Encourage losing weight - Exercise and healthy diet  DVT prophylaxis: SQ Lovenox    Code Status: Full Code Disposition:  SNF - has bed can go tomorrow per toc  Consultants:  neurosurgery  Procedures:  None  Antimicrobials:  Anti-infectives (From admission, onward)    None       Data Reviewed: I have personally reviewed following labs and imaging studies CBC: Recent Labs  Lab 07/20/24 0040 07/25/24 0416  WBC 6.2 5.9  HGB 10.1* 9.7*  HCT 30.5* 29.6*  MCV 87.1 88.1  PLT 200 212   Basic Metabolic Panel: Recent Labs  Lab 07/20/24 0040 07/21/24 0211 07/25/24 0416  NA 139 138 136  K 5.0 4.6 4.8  CL 107 105 108  CO2 23 25 24   GLUCOSE 286* 160* 94  BUN 34* 44* 50*  CREATININE 1.28* 1.19* 1.13*  CALCIUM  9.1 9.5 9.3   GFR: Estimated Creatinine Clearance: 67.2 mL/min (A) (by C-G formula based on SCr of 1.13 mg/dL (H)). Liver Function Tests: No results for input(s): AST, ALT, ALKPHOS, BILITOT, PROT, ALBUMIN in the last 168 hours. CBG: Recent Labs  Lab 07/25/24 1140 07/25/24 1659 07/25/24 1955 07/26/24 0732 07/26/24 1122  GLUCAP 238* 139* 102* 173* 87    Recent Results (from the past 240 hours)  Chlamydia/NGC rt PCR ([facility] only)     Status: None   Collection Time: 07/26/24 12:17 AM   Specimen: Urine   Result Value Ref Range Status   Specimen source GC/Chlam URINE, RANDOM  Final   Chlamydia Tr NOT DETECTED NOT DETECTED Final   N gonorrhoeae NOT DETECTED NOT DETECTED Final    Comment: (NOTE) This CT/NG assay has not been evaluated in patients with a history of  hysterectomy. Performed at [facility], [address], [place], [place] [address]      Radiology Studies: No results found.   Scheduled Meds:  amLODipine   5 mg Oral QHS   aspirin  EC  81 mg Oral QHS   atorvastatin   80 mg Oral Daily   carvedilol   12.5 mg Oral BID WC   enoxaparin  (LOVENOX ) injection  40 mg Subcutaneous Q24H   ezetimibe   10 mg Oral Daily   gabapentin   800 mg Oral TID   insulin  aspart  0-5 Units Subcutaneous QHS   insulin  aspart  0-9 Units Subcutaneous TID WC   insulin  aspart  10 Units Subcutaneous TID with meals  insulin  glargine-yfgn  60 Units Subcutaneous QHS   lidocaine   1 patch Transdermal QHS   sacubitril -valsartan   1 tablet Oral BID   spironolactone   25 mg Oral Daily   Continuous Infusions:   LOS: 0 days   Devaughn KATHEE Ban, MD [facility]  To contact the attending physician between 7A-7P please use Epic Chat. To contact the covering physician during after hours 7P-7A, please review Amion.  07/26/2024, 2:33 PM

## 2024-07-27 ENCOUNTER — Telehealth: Payer: Self-pay | Admitting: Neurosurgery

## 2024-07-27 DIAGNOSIS — G959 Disease of spinal cord, unspecified: Secondary | ICD-10-CM | POA: Diagnosis not present

## 2024-07-27 DIAGNOSIS — F418 Other specified anxiety disorders: Secondary | ICD-10-CM | POA: Diagnosis not present

## 2024-07-27 DIAGNOSIS — E1122 Type 2 diabetes mellitus with diabetic chronic kidney disease: Secondary | ICD-10-CM | POA: Diagnosis not present

## 2024-07-27 DIAGNOSIS — I255 Ischemic cardiomyopathy: Secondary | ICD-10-CM | POA: Diagnosis not present

## 2024-07-27 DIAGNOSIS — E875 Hyperkalemia: Secondary | ICD-10-CM | POA: Diagnosis not present

## 2024-07-27 DIAGNOSIS — I2510 Atherosclerotic heart disease of native coronary artery without angina pectoris: Secondary | ICD-10-CM | POA: Diagnosis not present

## 2024-07-27 DIAGNOSIS — U071 COVID-19: Secondary | ICD-10-CM | POA: Diagnosis not present

## 2024-07-27 DIAGNOSIS — M4808 Spinal stenosis, sacral and sacrococcygeal region: Secondary | ICD-10-CM | POA: Diagnosis not present

## 2024-07-27 DIAGNOSIS — M545 Low back pain, unspecified: Secondary | ICD-10-CM | POA: Diagnosis not present

## 2024-07-27 DIAGNOSIS — Z7982 Long term (current) use of aspirin: Secondary | ICD-10-CM | POA: Diagnosis not present

## 2024-07-27 DIAGNOSIS — J45909 Unspecified asthma, uncomplicated: Secondary | ICD-10-CM | POA: Diagnosis not present

## 2024-07-27 DIAGNOSIS — M48062 Spinal stenosis, lumbar region with neurogenic claudication: Secondary | ICD-10-CM | POA: Diagnosis not present

## 2024-07-27 DIAGNOSIS — Z7984 Long term (current) use of oral hypoglycemic drugs: Secondary | ICD-10-CM | POA: Diagnosis not present

## 2024-07-27 DIAGNOSIS — I5042 Chronic combined systolic (congestive) and diastolic (congestive) heart failure: Secondary | ICD-10-CM | POA: Diagnosis not present

## 2024-07-27 DIAGNOSIS — Z7985 Long-term (current) use of injectable non-insulin antidiabetic drugs: Secondary | ICD-10-CM | POA: Diagnosis not present

## 2024-07-27 DIAGNOSIS — Z794 Long term (current) use of insulin: Secondary | ICD-10-CM | POA: Diagnosis not present

## 2024-07-27 DIAGNOSIS — E1140 Type 2 diabetes mellitus with diabetic neuropathy, unspecified: Secondary | ICD-10-CM | POA: Diagnosis not present

## 2024-07-27 DIAGNOSIS — N1831 Chronic kidney disease, stage 3a: Secondary | ICD-10-CM | POA: Diagnosis not present

## 2024-07-27 DIAGNOSIS — G8929 Other chronic pain: Secondary | ICD-10-CM | POA: Diagnosis not present

## 2024-07-27 DIAGNOSIS — E785 Hyperlipidemia, unspecified: Secondary | ICD-10-CM | POA: Diagnosis not present

## 2024-07-27 DIAGNOSIS — Z9181 History of falling: Secondary | ICD-10-CM | POA: Diagnosis not present

## 2024-07-27 DIAGNOSIS — I130 Hypertensive heart and chronic kidney disease with heart failure and stage 1 through stage 4 chronic kidney disease, or unspecified chronic kidney disease: Secondary | ICD-10-CM | POA: Diagnosis not present

## 2024-07-27 DIAGNOSIS — M48061 Spinal stenosis, lumbar region without neurogenic claudication: Secondary | ICD-10-CM | POA: Diagnosis not present

## 2024-07-27 DIAGNOSIS — K589 Irritable bowel syndrome without diarrhea: Secondary | ICD-10-CM | POA: Diagnosis not present

## 2024-07-27 DIAGNOSIS — G43009 Migraine without aura, not intractable, without status migrainosus: Secondary | ICD-10-CM | POA: Diagnosis not present

## 2024-07-27 LAB — GLUCOSE, CAPILLARY
Glucose-Capillary: 241 mg/dL — ABNORMAL HIGH (ref 70–99)
Glucose-Capillary: 71 mg/dL (ref 70–99)

## 2024-07-27 MED ORDER — ONDANSETRON 4 MG PO TBDP
4.0000 mg | ORAL_TABLET | Freq: Four times a day (QID) | ORAL | Status: DC | PRN
  Administered 2024-07-27: 4 mg via ORAL
  Filled 2024-07-27 (×2): qty 1

## 2024-07-27 MED ORDER — TRAMADOL HCL 50 MG PO TABS: 50.0000 mg | ORAL_TABLET | Freq: Four times a day (QID) | ORAL | 0 refills | Status: DC | PRN

## 2024-07-27 MED ORDER — INSULIN ASPART 100 UNIT/ML IJ SOLN: 10.0000 [IU] | Freq: Three times a day (TID) | INTRAMUSCULAR | 11 refills | Status: AC

## 2024-07-27 NOTE — Progress Notes (Signed)
 Occupational Therapy Treatment Patient Details Name: Sarah Duffy MRN: [id number] DOB: 02/19/1971 Today's Date: 07/27/2024   History of present illness Pt is a 53 y/o F admitted on 07/19/24 after presenting with c/o worsening lower back pain, reports 2 falls this past week. Lumbar MRI showed moderate to severe canal stenosis with subarticular recess stenosis in L3-L4, L4-L5, L5-S1 levels. Neurosurgery consulted; pt not a candidate for surgical treatment at this time. PMH: chronic lower back pain, HLD, MD, CAD, sCHF, CKD3A, migraine, depression, anxiety   OT comments  Pt seen for OT treatment on this date. Upon arrival to room pt supine in bed, agreeable to tx. Education provided on AE for LB ADL engagement, review of back precaution/recommendations for comfort, education provided on benefits of advocating for self throughout continuum of care to promote autonomy through therapeutic use of self.  Pt making good progress toward goals, will continue to follow POC. Discharge recommendation remains appropriate.        If plan is discharge home, recommend the following:  A little help with walking and/or transfers;A little help with bathing/dressing/bathroom;Help with stairs or ramp for entrance;Assistance with cooking/housework   Equipment Recommendations  BSC/3in1;Tub/shower seat    Recommendations for Other Services      Precautions / Restrictions Precautions Precautions: Fall Recall of Precautions/Restrictions: Intact Restrictions Weight Bearing Restrictions Per Provider Order: No       Mobility Bed Mobility Overal bed mobility: Needs Assistance Bed Mobility: Rolling, Sidelying to Sit, Sit to Sidelying Rolling: Modified independent (Device/Increase time) Sidelying to sit: Supervision, HOB elevated, Used rails            Transfers                         Balance                                           ADL either performed or assessed with  clinical judgement   ADL Overall ADL's : Needs assistance/impaired                                            Extremity/Trunk Assessment Upper Extremity Assessment Upper Extremity Assessment: Generalized weakness            Vision       Perception     Praxis     Communication Communication Communication: No apparent difficulties   Cognition Arousal: Alert Behavior During Therapy: WFL for tasks assessed/performed Cognition: No apparent impairments                               Following commands: Intact        Cueing   Cueing Techniques: Verbal cues  Exercises Other Exercises Other Exercises: Education on use of AE for LB ADL engagement and back precautions/recommendations for comfort Other Exercises: Education on energy conservation techniques in next venue of care Other Exercises: Education on benefits of advocating for self throughout continuum of care to promote autonomy.    Shoulder Instructions       General Comments      Pertinent Vitals/ Pain       Pain Assessment Pain Assessment: Faces Faces Pain Scale: Hurts little more  Pain Location: lower back  Home Living                                          Prior Functioning/Environment              Frequency  Min 3X/week        Progress Toward Goals  OT Goals(current goals can now be found in the care plan section)  Progress towards OT goals: Progressing toward goals     Plan      Co-evaluation                 AM-PAC OT 6 Clicks Daily Activity     Outcome Measure   Help from another person eating meals?: None Help from another person taking care of personal grooming?: None Help from another person toileting, which includes using toliet, bedpan, or urinal?: A Little Help from another person bathing (including washing, rinsing, drying)?: A Little Help from another person to put on and taking off regular upper body  clothing?: None Help from another person to put on and taking off regular lower body clothing?: A Little 6 Click Score: 21    End of Session    OT Visit Diagnosis: Other abnormalities of gait and mobility (R26.89);Muscle weakness (generalized) (M62.81)   Activity Tolerance Patient tolerated treatment well   Patient Left in bed;with call bell/phone within reach   Nurse Communication          Time: 8587-8547 OT Time Calculation (min): 40 min  Charges: OT General Charges $OT Visit: 1 Visit OT Treatments $Therapeutic Activity: 38-52 mins  Harlene Sharps OTR/L   Harlene LITTIE Sharps 07/27/2024, 3:29 PM

## 2024-07-27 NOTE — Progress Notes (Signed)
 Mobility Specialist - Progress Note     07/27/24 1300  Mobility  Activity Ambulated with assistance;Stood at bedside  Level of Assistance Standby assist, set-up cues, supervision of patient - no hands on  Assistive Device Four wheel walker  Distance Ambulated (ft) 160 ft  Range of Motion/Exercises Active  Activity Response Tolerated well  Mobility Referral Yes  Mobility visit 1 Mobility  Mobility Specialist Start Time (ACUTE ONLY) 1135  Mobility Specialist Stop Time (ACUTE ONLY) 1157  Mobility Specialist Time Calculation (min) (ACUTE ONLY) 22 min   Pt resting in recliner on RA upon entry. Pt STS and ambulates to hallway around NS SBA. Pt returned to recliner and left with needs in reach. Chair alarm activated.   Guido Rumble Mobility Specialist 07/27/24, 2:59 PM

## 2024-07-27 NOTE — Discharge Summary (Signed)
 Sarah Duffy FMW:[id number] DOB: August 19, 1971 DOA: 07/19/2024  PCP: [name], Krichna, MD  Admit date: 07/19/2024 Discharge date: 07/27/2024  Time spent: 35 minutes  Recommendations for Outpatient Follow-up:  Pcp and neurosurgery f/u     Discharge Diagnoses:  Principal Problem:   Lumbar spinal stenosis Active Problems:   Fall at home, initial encounter   Coronary artery disease   Hypertension   Chronic combined systolic and diastolic CHF (congestive heart failure) (HCC)   Hyperlipidemia LDL goal <70   Type II diabetes mellitus with renal manifestations (HCC)   Asthma, well controlled   Chronic kidney disease, stage 3a (HCC)   Chronic migraine w/o aura, not intractable, w/o stat migr   Overweight (BMI 25.0-29.9)   Cervical myelopathy (HCC)   Impaired gait and mobility   Discharge Condition: stable  Diet recommendation: carb modified  Filed Weights   07/25/24 0500 07/26/24 0500 07/27/24 0500  Weight: 86.5 kg 87.8 kg 94.7 kg    History of present illness:  From admission h and p  Sarah Duffy is a 53 y.o. female with medical history significant of chronic lower back pain, HLD, MD, CAD, sCHF with EF 49%, CKD-3a, migraine, depression with anxiety, who presents with worsening lower back pain.   Pt states that she has chronic lower back pain since March.  She is following up with [facility] pain management clinic and getting steroid injections every 3 months. However this injection is delayed by more than a month per patient. She states over the past 1 week, her lower back pain has been significantly worsening.  The pain is constant, sharp, severe, radiating to posterior legs, associated with leg weakness and gait instability.  She cannot walk normally.  Denies numbness in the legs.  No loss control of bladder or bowel movement.  She states that she fell twice in the past week, no loss of consciousness.  She hit the back of her head 5 days ago during the fall.  Denies headache or neck pain.   Patient not on blood thinner. Patient does not have chest pain, cough, SOB.  No nausea, vomiting, diarrhea or abdominal pain.  No symptoms of UTI.  No fever or chills.   Hospital Course:   Lumbar spinal stenosis with neurogenic claudication - Patient has severe pain, leg weakness and gait instability, lives alone at home - MRI of lumbar spine showed moderate to severe canal stenosis with subarticular recess stenosis in L3-L4, L4-L5, L5-S1 levels - Seen by Neurosurgery, appreciate recs. Not an appropriate candidate for surgical intervention for the time being, advise outpt f/u. Is already established with them - Multimodal pain control   - PT/OT rec SNF, pending placement   Coronary artery disease: No chest pain -Continue aspirin , Lipitor, Zetia   Hypertension -IV hydralazine  as needed - Patient is on spironolactone , amlodipine  and Entresto    Chronic combined systolic and diastolic CHF (congestive heart failure) (HCC): 2D echo on 04/02/2024 showed EF of 49% with grade 1 diastolic dysfunction.  Patient has trace leg edema, no SOB, no JVD, CHF seem to be compensated. -Continue home spironolactone , Entresto , Coreg    Hyperlipidemia LDL goal <70 -Lipitor, Zetia    Type II diabetes mellitus with renal manifestations [facility]):  Recent A1c 10.4, poorly controlled (08/25).   - Follows up with Endocrinology outpatient   Asthma, well controlled: Stable   Chronic kidney disease, stage 3a (HCC)stable   Chronic migraine w/o aura, not intractable, w/o stat migraine:  - Patient is on Emgality  injection every month for prophylaxis.  Procedures: none   Consultations: neurosurgery  Discharge Exam: Vitals:   07/27/24 0753 07/27/24 0754  BP: (!) 127/55 (!) 127/55  Pulse: 77 79  Resp: 16 16  Temp: 98.3 F (36.8 C) 98.3 F (36.8 C)  SpO2: 99% 99%    General: NAD Cardiovascular: RRR Respiratory: CTAB  Discharge Instructions   Discharge Instructions     Diet Carb Modified   Complete  by: As directed    Increase activity slowly   Complete by: As directed       Allergies as of 07/27/2024   No Known Allergies      Medication List     STOP taking these medications    NovoLOG  FlexPen 100 UNIT/ML FlexPen Generic drug: insulin  aspart Replaced by: insulin  aspart 100 UNIT/ML injection   prednisoLONE  acetate 1 % ophthalmic suspension Commonly known as: PRED FORTE    Vitamin D (Ergocalciferol) 1.25 MG (50000 UNIT) Caps capsule Commonly known as: DRISDOL       TAKE these medications    Accu-Chek Guide test strip Generic drug: glucose blood Check blood sugar 2 times daily as needed   Accu-Chek Guide w/Device Kit Use to check blood sugar 2 time daily as needed   accu-chek multiclix lancets Check sugar 2 times daily as needed.   amLODipine  5 MG tablet Commonly known as: NORVASC  Take 1 tablet (5 mg total) by mouth daily.   aspirin  EC 81 MG tablet Take by mouth.   atorvastatin  80 MG tablet Commonly known as: LIPITOR Take 1 tablet (80 mg total) by mouth daily.   Basaglar  KwikPen 100 UNIT/ML Inject 70 Units into the skin daily.   carvedilol  12.5 MG tablet Commonly known as: COREG  TAKE 1 TABLET BY MOUTH 2 TIMES DAILY.   Dexcom G7 Sensor Misc 1 DEVICE BY DOES NOT APPLY ROUTE AS DIRECTED.   Emgality  120 MG/ML Sosy Generic drug: Galcanezumab -gnlm Inject 1 mL into the skin every 30 (thirty) days.   Entresto  24-26 MG Generic drug: sacubitril -valsartan  Take 1 tablet by mouth 2 (two) times daily.   ezetimibe  10 MG tablet Commonly known as: ZETIA  Take 1 tablet (10 mg total) by mouth daily.   gabapentin  800 MG tablet Commonly known as: NEURONTIN  Take 800 mg by mouth 3 (three) times daily.   insulin  aspart 100 UNIT/ML injection Commonly known as: novoLOG  Inject 10 Units into the skin with breakfast, with lunch, and with evening meal. Replaces: NovoLOG  FlexPen 100 UNIT/ML FlexPen   Insulin  Pen Needle 32G X 4 MM Misc 1 Device by Does not apply  route in the morning, at noon, in the evening, and at bedtime.   ipratropium 0.03 % nasal spray Commonly known as: ATROVENT  Place 2 sprays into both nostrils 2 (two) times daily.   Jardiance  25 MG Tabs tablet Generic drug: empagliflozin  Take 1 tablet (25 mg total) by mouth daily.   levocetirizine 5 MG tablet Commonly known as: XYZAL  Take 1 tablet (5 mg total) by mouth in the morning.   linaclotide  290 MCG Caps capsule Commonly known as: Linzess  Take 1 capsule (290 mcg total) by mouth daily before breakfast.   nitroGLYCERIN  0.4 MG SL tablet Commonly known as: NITROSTAT  PLACE 1 TABLET UNDER THE TONGUE EVERY 5 MINUTES AS NEEDED FOR CHEST PAIN.   Nurtec 75 MG Tbdp Generic drug: Rimegepant Sulfate  Take 1 tablet (75 mg total) by mouth daily as needed. Max of every other day prn   Ozempic  (2 MG/DOSE) 8 MG/3ML Sopn Generic drug: Semaglutide  (2 MG/DOSE) Inject 2 mg into the  skin once a week.   spironolactone  25 MG tablet Commonly known as: ALDACTONE  Take 1 tablet (25 mg total) by mouth daily.   traMADol  50 MG tablet Commonly known as: ULTRAM  Take 1 tablet (50 mg total) by mouth every 6 (six) hours as needed for severe pain (pain score 7-10).   Ventolin  HFA 108 (90 Base) MCG/ACT inhaler Generic drug: albuterol  INHALE 2 PUFFS BY MOUTH EVERY 4 HOURS AS NEEDED FOR WHEEZE OR FOR SHORTNESS OF BREATH       No Known Allergies  Contact information for follow-up providers     [name], Krichna, MD Follow up.   Specialty: Family Medicine Contact information: [address] [place] [address] [phone]         Claudene Penne ORN, MD Follow up.   Specialty: Neurosurgery Contact information: [address] [place] [address] [phone]              Contact information for after-discharge care     Destination     [facility] .   Service: Skilled Nursing Contact  information: [address] [place]  [address] [phone]                      The results of significant diagnostics from this hospitalization (including imaging, microbiology, ancillary and laboratory) are listed below for reference.    Significant Diagnostic Studies: CT HEAD WO CONTRAST ( ) Result Date: 07/20/2024 CLINICAL DATA:  Head trauma, moderate-severe Fall and head injury x Saturday. EXAM: CT HEAD WITHOUT CONTRAST TECHNIQUE: Contiguous axial images were obtained from the base of the skull through the vertex without intravenous contrast. RADIATION DOSE REDUCTION: This exam was performed according to the departmental dose-optimization program which includes automated exposure control, adjustment of the mA and/or kV according to patient size and/or use of iterative reconstruction technique. COMPARISON:  MRI head 01/02/2024, CT head 09/19/2023 FINDINGS: Brain: No evidence of large-territorial acute infarction. No parenchymal hemorrhage. No mass lesion. No extra-axial collection. No mass effect or midline shift. No hydrocephalus. Basilar cisterns are patent. Vascular: No hyperdense vessel. Atherosclerotic calcifications are present within the cavernous internal carotid arteries. Skull: No acute fracture or focal lesion. Sinuses/Orbits: Paranasal sinuses and mastoid air cells are clear. The orbits are unremarkable. Other: None. IMPRESSION: No acute intracranial abnormality. Electronically Signed   By: Morgane  Naveau M.D.   On: 07/20/2024 01:32   MR LUMBAR SPINE WO CONTRAST Result Date: 07/19/2024 CLINICAL DATA:  Low back pain, cauda equina syndrome suspected EXAM: MRI LUMBAR SPINE WITHOUT CONTRAST TECHNIQUE: Multiplanar, multisequence MR imaging of the lumbar spine was performed. No intravenous contrast was administered. COMPARISON:  None Available. FINDINGS: Segmentation: Standard. Alignment:  No substantial sagittal subluxation. Vertebrae:  No fracture,  evidence of discitis, or bone lesion. Conus medullaris and cauda equina: Conus extends to the L1-L2 level. Conus and cauda equina appear normal. Paraspinal and other soft tissues: Unremarkable. Disc levels: T12-L1: No significant disc protrusion, foraminal stenosis, or canal stenosis. L1-L2: No significant disc protrusion, foraminal stenosis, or canal stenosis. L2-L3: Facet arthropathy.  No significant stenosis. L3-L4: Disc bulging, ligamentum flavum thickening and facet arthropathy with superimposed right paracentral disc protrusion. Prominent dorsal epidural fat. Resulting moderate to severe canal stenosis with severe right and moderate left subarticular recess stenosis. Mild right foraminal stenosis. L4-L5: Posterior disc bulge with ligamentum flavum thickening and bilateral facet arthropathy. Resulting moderate to severe canal stenosis with severe bilateral subarticular recess  stenosis. Mild bilateral foraminal stenosis. L5-S1: Posterior disc bulge with superimposed central disc protrusion with annular fissure. Resulting moderate canal stenosis and moderate to severe bilateral subarticular recess stenosis. Moderate left and mild right foraminal stenosis. IMPRESSION: 1. At L3-L4, moderate to severe canal stenosis with severe right and moderate left subarticular recess stenosis. 2. At L4-L5, moderate to severe canal stenosis with severe bilateral subarticular recess stenosis. 3. At L5-S1, moderate canal stenosis with moderate to severe bilateral subarticular recess stenosis. Moderate left and mild right foraminal stenosis. Electronically Signed   By: Gilmore GORMAN Molt M.D.   On: 07/19/2024 22:40    Microbiology: Recent Results (from the past 240 hours)  Chlamydia/NGC rt PCR ([facility] only)     Status: None   Collection Time: 07/26/24 12:17 AM   Specimen: Urine  Result Value Ref Range Status   Specimen source GC/Chlam URINE, RANDOM  Final   Chlamydia Tr NOT DETECTED NOT DETECTED Final   N gonorrhoeae NOT  DETECTED NOT DETECTED Final    Comment: (NOTE) This CT/NG assay has not been evaluated in patients with a history of  hysterectomy. Performed at [facility], [address] [place], [place] [address]      Labs: Basic Metabolic Panel: Recent Labs  Lab 07/21/24 0211 07/25/24 0416  NA 138 136  K 4.6 4.8  CL 105 108  CO2 25 24  GLUCOSE 160* 94  BUN 44* 50*  CREATININE 1.19* 1.13*  CALCIUM  9.5 9.3   Liver Function Tests: No results for input(s): AST, ALT, ALKPHOS, BILITOT, PROT, ALBUMIN in the last 168 hours. No results for input(s): LIPASE, AMYLASE in the last 168 hours. No results for input(s): AMMONIA in the last 168 hours. CBC: Recent Labs  Lab 07/25/24 0416  WBC 5.9  HGB 9.7*  HCT 29.6*  MCV 88.1  PLT 212   Cardiac Enzymes: No results for input(s): CKTOTAL, CKMB, CKMBINDEX, TROPONINI in the last 168 hours. BNP: BNP (last 3 results) Recent Labs    07/20/24 0040  BNP 84.7    ProBNP (last 3 results) No results for input(s): PROBNP in the last 8760 hours.  CBG: Recent Labs  Lab 07/26/24 0732 07/26/24 1122 07/26/24 1650 07/26/24 2129 07/27/24 0751  GLUCAP 173* 87 290* 156* 241*       Signed:  Devaughn KATHEE Ban MD.  [facility] 07/27/2024, 10:15 AM

## 2024-07-27 NOTE — TOC Transition Note (Addendum)
 Transition of Care [facility]) - Discharge Note   Patient Details  Name: Sarah Duffy MRN: [id number] Date of Birth: 03-Apr-1971  [facility]) CM/SW Contact:  Alvaro Louder, LCSW Phone Number: 07/27/2024, 12:19 PM   Clinical Narrative:     LCSWA received insurance approval for patient to admit to SNF [facility] . LCSWA confirmed with MD that patient is stable for discharge. LCSWA notified the patient and they are in agreement with discharge. LCSWA confirmed bed is available at. Transport arranged with patients Family/Friend.   RM: 601 Number: [phone]   TOC Signing off  Final next level of care: Skilled Nursing Facility Barriers to Discharge: No Barriers Identified   Patient Goals and CMS Choice            Discharge Placement              Patient chooses bed at: [facility] Patient to be transferred to facility by: Friend Name of family member notified: Self Patient and family notified of of transfer: 07/27/24  Discharge Plan and Services Additional resources added to the After Visit Summary for                                       Social Drivers of Health (SDOH) Interventions SDOH Screenings   Food Insecurity: No Food Insecurity (07/20/2024)  Recent Concern: Food Insecurity - Food Insecurity Present (05/15/2024)   Received from [facility]  Housing: High Risk (07/20/2024)  Transportation Needs: No Transportation Needs (07/20/2024)  Recent Concern: Transportation Needs - Unmet Transportation Needs (05/15/2024)   Received from [facility]  Utilities: Not At Risk (07/20/2024)  Recent Concern: Utilities - At Risk (05/15/2024)   Received from [facility]  Alcohol Screen: Low Risk  (02/26/2023)  Depression (PHQ2-9): Low Risk  (07/05/2024)  Financial Resource Strain: High Risk (05/15/2024)   Received from [facility]  Physical Activity: Insufficiently Active  (05/15/2024)   Received from [facility]  Social Connections: Unknown (07/20/2024)  Recent Concern: Social Connections - Moderately Isolated (05/15/2024)   Received from [facility]  Stress: Stress Concern Present (05/15/2024)   Received from [facility]  Tobacco Use: Low Risk  (07/19/2024)  Health Literacy: Adequate Health Literacy (05/15/2024)   Received from [facility]     Readmission Risk Interventions     No data to display

## 2024-07-27 NOTE — Plan of Care (Signed)

## 2024-07-27 NOTE — Telephone Encounter (Signed)
 Patient called stating that she in currently in the hospital and she is supposed to go to physical therapy. She wants to make sure this PT is completed at an outpatient facility.  I mentioned to the patient that since this being set up within the hospital, she should mention it to the nurse that is on call at that time.

## 2024-07-28 DIAGNOSIS — I2510 Atherosclerotic heart disease of native coronary artery without angina pectoris: Secondary | ICD-10-CM | POA: Diagnosis not present

## 2024-07-28 DIAGNOSIS — N183 Chronic kidney disease, stage 3 unspecified: Secondary | ICD-10-CM | POA: Diagnosis not present

## 2024-07-28 DIAGNOSIS — E1122 Type 2 diabetes mellitus with diabetic chronic kidney disease: Secondary | ICD-10-CM | POA: Diagnosis not present

## 2024-07-28 DIAGNOSIS — I129 Hypertensive chronic kidney disease with stage 1 through stage 4 chronic kidney disease, or unspecified chronic kidney disease: Secondary | ICD-10-CM | POA: Diagnosis not present

## 2024-07-28 DIAGNOSIS — J45909 Unspecified asthma, uncomplicated: Secondary | ICD-10-CM | POA: Diagnosis not present

## 2024-07-28 DIAGNOSIS — R600 Localized edema: Secondary | ICD-10-CM | POA: Diagnosis not present

## 2024-07-28 DIAGNOSIS — G43009 Migraine without aura, not intractable, without status migrainosus: Secondary | ICD-10-CM | POA: Diagnosis not present

## 2024-07-28 DIAGNOSIS — M48062 Spinal stenosis, lumbar region with neurogenic claudication: Secondary | ICD-10-CM | POA: Diagnosis not present

## 2024-07-31 ENCOUNTER — Inpatient Hospital Stay: Admitting: Family Medicine

## 2024-07-31 DIAGNOSIS — M48062 Spinal stenosis, lumbar region with neurogenic claudication: Secondary | ICD-10-CM | POA: Diagnosis not present

## 2024-07-31 DIAGNOSIS — N183 Chronic kidney disease, stage 3 unspecified: Secondary | ICD-10-CM | POA: Diagnosis not present

## 2024-07-31 DIAGNOSIS — E875 Hyperkalemia: Secondary | ICD-10-CM | POA: Diagnosis not present

## 2024-07-31 DIAGNOSIS — R600 Localized edema: Secondary | ICD-10-CM | POA: Diagnosis not present

## 2024-07-31 DIAGNOSIS — I130 Hypertensive heart and chronic kidney disease with heart failure and stage 1 through stage 4 chronic kidney disease, or unspecified chronic kidney disease: Secondary | ICD-10-CM | POA: Diagnosis not present

## 2024-07-31 DIAGNOSIS — I2510 Atherosclerotic heart disease of native coronary artery without angina pectoris: Secondary | ICD-10-CM | POA: Diagnosis not present

## 2024-07-31 DIAGNOSIS — E1122 Type 2 diabetes mellitus with diabetic chronic kidney disease: Secondary | ICD-10-CM | POA: Diagnosis not present

## 2024-08-01 ENCOUNTER — Ambulatory Visit: Admitting: Dietician

## 2024-08-02 ENCOUNTER — Telehealth: Payer: Self-pay

## 2024-08-02 DIAGNOSIS — M48062 Spinal stenosis, lumbar region with neurogenic claudication: Secondary | ICD-10-CM | POA: Diagnosis not present

## 2024-08-02 DIAGNOSIS — K5900 Constipation, unspecified: Secondary | ICD-10-CM | POA: Diagnosis not present

## 2024-08-02 DIAGNOSIS — I5022 Chronic systolic (congestive) heart failure: Secondary | ICD-10-CM

## 2024-08-02 DIAGNOSIS — E1122 Type 2 diabetes mellitus with diabetic chronic kidney disease: Secondary | ICD-10-CM | POA: Diagnosis not present

## 2024-08-02 DIAGNOSIS — R600 Localized edema: Secondary | ICD-10-CM | POA: Diagnosis not present

## 2024-08-02 DIAGNOSIS — E875 Hyperkalemia: Secondary | ICD-10-CM | POA: Diagnosis not present

## 2024-08-02 DIAGNOSIS — I130 Hypertensive heart and chronic kidney disease with heart failure and stage 1 through stage 4 chronic kidney disease, or unspecified chronic kidney disease: Secondary | ICD-10-CM | POA: Diagnosis not present

## 2024-08-02 DIAGNOSIS — I10 Essential (primary) hypertension: Secondary | ICD-10-CM

## 2024-08-02 DIAGNOSIS — E1165 Type 2 diabetes mellitus with hyperglycemia: Secondary | ICD-10-CM

## 2024-08-02 DIAGNOSIS — I2510 Atherosclerotic heart disease of native coronary artery without angina pectoris: Secondary | ICD-10-CM | POA: Diagnosis not present

## 2024-08-02 DIAGNOSIS — G43009 Migraine without aura, not intractable, without status migrainosus: Secondary | ICD-10-CM | POA: Diagnosis not present

## 2024-08-02 DIAGNOSIS — N183 Chronic kidney disease, stage 3 unspecified: Secondary | ICD-10-CM | POA: Diagnosis not present

## 2024-08-04 DIAGNOSIS — M48062 Spinal stenosis, lumbar region with neurogenic claudication: Secondary | ICD-10-CM | POA: Diagnosis not present

## 2024-08-04 DIAGNOSIS — E1122 Type 2 diabetes mellitus with diabetic chronic kidney disease: Secondary | ICD-10-CM | POA: Diagnosis not present

## 2024-08-04 DIAGNOSIS — I130 Hypertensive heart and chronic kidney disease with heart failure and stage 1 through stage 4 chronic kidney disease, or unspecified chronic kidney disease: Secondary | ICD-10-CM | POA: Diagnosis not present

## 2024-08-04 DIAGNOSIS — G43009 Migraine without aura, not intractable, without status migrainosus: Secondary | ICD-10-CM | POA: Diagnosis not present

## 2024-08-04 DIAGNOSIS — G894 Chronic pain syndrome: Secondary | ICD-10-CM | POA: Diagnosis not present

## 2024-08-04 DIAGNOSIS — R600 Localized edema: Secondary | ICD-10-CM | POA: Diagnosis not present

## 2024-08-04 DIAGNOSIS — I2510 Atherosclerotic heart disease of native coronary artery without angina pectoris: Secondary | ICD-10-CM | POA: Diagnosis not present

## 2024-08-07 ENCOUNTER — Ambulatory Visit: Admitting: Neurosurgery

## 2024-08-07 ENCOUNTER — Telehealth: Payer: Self-pay

## 2024-08-07 DIAGNOSIS — R600 Localized edema: Secondary | ICD-10-CM | POA: Diagnosis not present

## 2024-08-07 DIAGNOSIS — N1832 Chronic kidney disease, stage 3b: Secondary | ICD-10-CM | POA: Diagnosis not present

## 2024-08-07 DIAGNOSIS — G4733 Obstructive sleep apnea (adult) (pediatric): Secondary | ICD-10-CM | POA: Diagnosis not present

## 2024-08-07 DIAGNOSIS — M48062 Spinal stenosis, lumbar region with neurogenic claudication: Secondary | ICD-10-CM | POA: Diagnosis not present

## 2024-08-07 DIAGNOSIS — M549 Dorsalgia, unspecified: Secondary | ICD-10-CM | POA: Diagnosis not present

## 2024-08-07 DIAGNOSIS — G43909 Migraine, unspecified, not intractable, without status migrainosus: Secondary | ICD-10-CM | POA: Diagnosis not present

## 2024-08-07 DIAGNOSIS — J45909 Unspecified asthma, uncomplicated: Secondary | ICD-10-CM | POA: Diagnosis not present

## 2024-08-07 DIAGNOSIS — Z794 Long term (current) use of insulin: Secondary | ICD-10-CM | POA: Diagnosis not present

## 2024-08-07 DIAGNOSIS — E785 Hyperlipidemia, unspecified: Secondary | ICD-10-CM | POA: Diagnosis not present

## 2024-08-07 DIAGNOSIS — E1122 Type 2 diabetes mellitus with diabetic chronic kidney disease: Secondary | ICD-10-CM | POA: Diagnosis not present

## 2024-08-07 DIAGNOSIS — I509 Heart failure, unspecified: Secondary | ICD-10-CM | POA: Diagnosis not present

## 2024-08-07 DIAGNOSIS — I130 Hypertensive heart and chronic kidney disease with heart failure and stage 1 through stage 4 chronic kidney disease, or unspecified chronic kidney disease: Secondary | ICD-10-CM | POA: Diagnosis not present

## 2024-08-07 DIAGNOSIS — E1169 Type 2 diabetes mellitus with other specified complication: Secondary | ICD-10-CM | POA: Diagnosis not present

## 2024-08-07 NOTE — Progress Notes (Signed)
 Complex Care Management Note Care Guide Note  08/07/2024 Name: Sarah Duffy MRN: [id number] DOB: 12/23/1970   Complex Care Management Outreach Attempts: An unsuccessful telephone outreach was attempted today to offer the patient information about available complex care management services.  Follow Up Plan:  Additional outreach attempts will be made to offer the patient complex care management information and services.   Encounter Outcome:  No Answer  D[facility]  [facility], [facility] VBCI Assistant Direct Dial : [phone]  Fax: [phone]

## 2024-08-07 NOTE — Progress Notes (Signed)
 Complex Care Management Note  Care Guide Note 08/07/2024 Name: Sarah Duffy MRN: [id number] DOB: [birth year]  Sarah Duffy is a 53 y.o. year old female who sees [name], Krichna, MD for primary care. I reached out to Shambhavi Rady by phone today to offer complex care management services.  Sarah Duffy was given information about Complex Care Management services today including:   The Complex Care Management services include support from the care team which includes your Nurse Care Manager, Clinical Social Worker, or Pharmacist.  The Complex Care Management team is here to help remove barriers to the health concerns and goals most important to you. Complex Care Management services are voluntary, and the patient may decline or stop services at any time by request to their care team member.   Complex Care Management Consent Status: Patient agreed to services and verbal consent obtained.   Follow up plan:  Telephone appointment with complex care management team member scheduled for:  08/16/24 at 11:00 a.m,   Encounter Outcome:  Patient Scheduled  Sarah Duffy [facility]  [facility], [facility] VBCI Assistant Direct Dial : [phone]  Fax: [phone]

## 2024-08-09 DIAGNOSIS — M48062 Spinal stenosis, lumbar region with neurogenic claudication: Secondary | ICD-10-CM | POA: Diagnosis not present

## 2024-08-09 DIAGNOSIS — I509 Heart failure, unspecified: Secondary | ICD-10-CM | POA: Diagnosis not present

## 2024-08-09 DIAGNOSIS — N1832 Chronic kidney disease, stage 3b: Secondary | ICD-10-CM | POA: Diagnosis not present

## 2024-08-09 DIAGNOSIS — I130 Hypertensive heart and chronic kidney disease with heart failure and stage 1 through stage 4 chronic kidney disease, or unspecified chronic kidney disease: Secondary | ICD-10-CM | POA: Diagnosis not present

## 2024-08-09 DIAGNOSIS — U071 COVID-19: Secondary | ICD-10-CM | POA: Diagnosis not present

## 2024-08-09 DIAGNOSIS — G894 Chronic pain syndrome: Secondary | ICD-10-CM | POA: Diagnosis not present

## 2024-08-09 DIAGNOSIS — R600 Localized edema: Secondary | ICD-10-CM | POA: Diagnosis not present

## 2024-08-09 DIAGNOSIS — E1122 Type 2 diabetes mellitus with diabetic chronic kidney disease: Secondary | ICD-10-CM | POA: Diagnosis not present

## 2024-08-09 DIAGNOSIS — E875 Hyperkalemia: Secondary | ICD-10-CM | POA: Diagnosis not present

## 2024-08-09 DIAGNOSIS — I2510 Atherosclerotic heart disease of native coronary artery without angina pectoris: Secondary | ICD-10-CM | POA: Diagnosis not present

## 2024-08-09 DIAGNOSIS — Z794 Long term (current) use of insulin: Secondary | ICD-10-CM | POA: Diagnosis not present

## 2024-08-09 DIAGNOSIS — G43909 Migraine, unspecified, not intractable, without status migrainosus: Secondary | ICD-10-CM | POA: Diagnosis not present

## 2024-08-10 DIAGNOSIS — E1159 Type 2 diabetes mellitus with other circulatory complications: Secondary | ICD-10-CM | POA: Diagnosis not present

## 2024-08-10 DIAGNOSIS — E1165 Type 2 diabetes mellitus with hyperglycemia: Secondary | ICD-10-CM | POA: Diagnosis not present

## 2024-08-10 DIAGNOSIS — N1831 Chronic kidney disease, stage 3a: Secondary | ICD-10-CM | POA: Diagnosis not present

## 2024-08-10 DIAGNOSIS — E113513 Type 2 diabetes mellitus with proliferative diabetic retinopathy with macular edema, bilateral: Secondary | ICD-10-CM | POA: Diagnosis not present

## 2024-08-10 DIAGNOSIS — M48062 Spinal stenosis, lumbar region with neurogenic claudication: Secondary | ICD-10-CM | POA: Diagnosis not present

## 2024-08-10 DIAGNOSIS — Z794 Long term (current) use of insulin: Secondary | ICD-10-CM | POA: Diagnosis not present

## 2024-08-11 DIAGNOSIS — E1122 Type 2 diabetes mellitus with diabetic chronic kidney disease: Secondary | ICD-10-CM | POA: Diagnosis not present

## 2024-08-11 DIAGNOSIS — Z794 Long term (current) use of insulin: Secondary | ICD-10-CM | POA: Diagnosis not present

## 2024-08-11 DIAGNOSIS — I130 Hypertensive heart and chronic kidney disease with heart failure and stage 1 through stage 4 chronic kidney disease, or unspecified chronic kidney disease: Secondary | ICD-10-CM | POA: Diagnosis not present

## 2024-08-11 DIAGNOSIS — N1832 Chronic kidney disease, stage 3b: Secondary | ICD-10-CM | POA: Diagnosis not present

## 2024-08-11 DIAGNOSIS — R600 Localized edema: Secondary | ICD-10-CM | POA: Diagnosis not present

## 2024-08-11 DIAGNOSIS — G894 Chronic pain syndrome: Secondary | ICD-10-CM | POA: Diagnosis not present

## 2024-08-11 DIAGNOSIS — I509 Heart failure, unspecified: Secondary | ICD-10-CM | POA: Diagnosis not present

## 2024-08-11 DIAGNOSIS — M48062 Spinal stenosis, lumbar region with neurogenic claudication: Secondary | ICD-10-CM | POA: Diagnosis not present

## 2024-08-11 DIAGNOSIS — U071 COVID-19: Secondary | ICD-10-CM | POA: Diagnosis not present

## 2024-08-11 DIAGNOSIS — E875 Hyperkalemia: Secondary | ICD-10-CM | POA: Diagnosis not present

## 2024-08-11 DIAGNOSIS — I2510 Atherosclerotic heart disease of native coronary artery without angina pectoris: Secondary | ICD-10-CM | POA: Diagnosis not present

## 2024-08-14 DIAGNOSIS — F418 Other specified anxiety disorders: Secondary | ICD-10-CM | POA: Diagnosis not present

## 2024-08-14 DIAGNOSIS — M4808 Spinal stenosis, sacral and sacrococcygeal region: Secondary | ICD-10-CM | POA: Diagnosis not present

## 2024-08-14 DIAGNOSIS — N1831 Chronic kidney disease, stage 3a: Secondary | ICD-10-CM | POA: Diagnosis not present

## 2024-08-14 DIAGNOSIS — Z7982 Long term (current) use of aspirin: Secondary | ICD-10-CM | POA: Diagnosis not present

## 2024-08-14 DIAGNOSIS — E1122 Type 2 diabetes mellitus with diabetic chronic kidney disease: Secondary | ICD-10-CM | POA: Diagnosis not present

## 2024-08-14 DIAGNOSIS — Z794 Long term (current) use of insulin: Secondary | ICD-10-CM | POA: Diagnosis not present

## 2024-08-14 DIAGNOSIS — G43009 Migraine without aura, not intractable, without status migrainosus: Secondary | ICD-10-CM | POA: Diagnosis not present

## 2024-08-14 DIAGNOSIS — G959 Disease of spinal cord, unspecified: Secondary | ICD-10-CM | POA: Diagnosis not present

## 2024-08-14 DIAGNOSIS — I2510 Atherosclerotic heart disease of native coronary artery without angina pectoris: Secondary | ICD-10-CM | POA: Diagnosis not present

## 2024-08-14 DIAGNOSIS — I5042 Chronic combined systolic (congestive) and diastolic (congestive) heart failure: Secondary | ICD-10-CM | POA: Diagnosis not present

## 2024-08-14 DIAGNOSIS — E1140 Type 2 diabetes mellitus with diabetic neuropathy, unspecified: Secondary | ICD-10-CM | POA: Diagnosis not present

## 2024-08-14 DIAGNOSIS — Z7985 Long-term (current) use of injectable non-insulin antidiabetic drugs: Secondary | ICD-10-CM | POA: Diagnosis not present

## 2024-08-14 DIAGNOSIS — M48062 Spinal stenosis, lumbar region with neurogenic claudication: Secondary | ICD-10-CM | POA: Diagnosis not present

## 2024-08-14 DIAGNOSIS — U071 COVID-19: Secondary | ICD-10-CM | POA: Diagnosis not present

## 2024-08-14 DIAGNOSIS — M545 Low back pain, unspecified: Secondary | ICD-10-CM | POA: Diagnosis not present

## 2024-08-14 DIAGNOSIS — I130 Hypertensive heart and chronic kidney disease with heart failure and stage 1 through stage 4 chronic kidney disease, or unspecified chronic kidney disease: Secondary | ICD-10-CM | POA: Diagnosis not present

## 2024-08-14 DIAGNOSIS — G8929 Other chronic pain: Secondary | ICD-10-CM | POA: Diagnosis not present

## 2024-08-14 DIAGNOSIS — E785 Hyperlipidemia, unspecified: Secondary | ICD-10-CM | POA: Diagnosis not present

## 2024-08-14 DIAGNOSIS — E875 Hyperkalemia: Secondary | ICD-10-CM | POA: Diagnosis not present

## 2024-08-14 DIAGNOSIS — K589 Irritable bowel syndrome without diarrhea: Secondary | ICD-10-CM | POA: Diagnosis not present

## 2024-08-14 DIAGNOSIS — Z7984 Long term (current) use of oral hypoglycemic drugs: Secondary | ICD-10-CM | POA: Diagnosis not present

## 2024-08-14 DIAGNOSIS — I255 Ischemic cardiomyopathy: Secondary | ICD-10-CM | POA: Diagnosis not present

## 2024-08-14 DIAGNOSIS — J45909 Unspecified asthma, uncomplicated: Secondary | ICD-10-CM | POA: Diagnosis not present

## 2024-08-14 DIAGNOSIS — Z9181 History of falling: Secondary | ICD-10-CM | POA: Diagnosis not present

## 2024-08-16 ENCOUNTER — Telehealth: Payer: Self-pay

## 2024-08-16 DIAGNOSIS — G43009 Migraine without aura, not intractable, without status migrainosus: Secondary | ICD-10-CM | POA: Diagnosis not present

## 2024-08-16 DIAGNOSIS — I130 Hypertensive heart and chronic kidney disease with heart failure and stage 1 through stage 4 chronic kidney disease, or unspecified chronic kidney disease: Secondary | ICD-10-CM | POA: Diagnosis not present

## 2024-08-16 DIAGNOSIS — J45909 Unspecified asthma, uncomplicated: Secondary | ICD-10-CM | POA: Diagnosis not present

## 2024-08-16 DIAGNOSIS — E1122 Type 2 diabetes mellitus with diabetic chronic kidney disease: Secondary | ICD-10-CM | POA: Diagnosis not present

## 2024-08-16 DIAGNOSIS — G8929 Other chronic pain: Secondary | ICD-10-CM | POA: Diagnosis not present

## 2024-08-16 DIAGNOSIS — U071 COVID-19: Secondary | ICD-10-CM | POA: Diagnosis not present

## 2024-08-16 DIAGNOSIS — K589 Irritable bowel syndrome without diarrhea: Secondary | ICD-10-CM | POA: Diagnosis not present

## 2024-08-16 DIAGNOSIS — E785 Hyperlipidemia, unspecified: Secondary | ICD-10-CM | POA: Diagnosis not present

## 2024-08-16 DIAGNOSIS — M545 Low back pain, unspecified: Secondary | ICD-10-CM | POA: Diagnosis not present

## 2024-08-16 DIAGNOSIS — Z7982 Long term (current) use of aspirin: Secondary | ICD-10-CM | POA: Diagnosis not present

## 2024-08-16 DIAGNOSIS — Z9181 History of falling: Secondary | ICD-10-CM | POA: Diagnosis not present

## 2024-08-16 DIAGNOSIS — I5042 Chronic combined systolic (congestive) and diastolic (congestive) heart failure: Secondary | ICD-10-CM | POA: Diagnosis not present

## 2024-08-16 DIAGNOSIS — G959 Disease of spinal cord, unspecified: Secondary | ICD-10-CM | POA: Diagnosis not present

## 2024-08-16 DIAGNOSIS — M4808 Spinal stenosis, sacral and sacrococcygeal region: Secondary | ICD-10-CM | POA: Diagnosis not present

## 2024-08-16 DIAGNOSIS — E1140 Type 2 diabetes mellitus with diabetic neuropathy, unspecified: Secondary | ICD-10-CM | POA: Diagnosis not present

## 2024-08-16 DIAGNOSIS — E875 Hyperkalemia: Secondary | ICD-10-CM | POA: Diagnosis not present

## 2024-08-16 DIAGNOSIS — I255 Ischemic cardiomyopathy: Secondary | ICD-10-CM | POA: Diagnosis not present

## 2024-08-16 DIAGNOSIS — Z794 Long term (current) use of insulin: Secondary | ICD-10-CM | POA: Diagnosis not present

## 2024-08-16 DIAGNOSIS — N1831 Chronic kidney disease, stage 3a: Secondary | ICD-10-CM | POA: Diagnosis not present

## 2024-08-16 DIAGNOSIS — Z7984 Long term (current) use of oral hypoglycemic drugs: Secondary | ICD-10-CM | POA: Diagnosis not present

## 2024-08-16 DIAGNOSIS — I2510 Atherosclerotic heart disease of native coronary artery without angina pectoris: Secondary | ICD-10-CM | POA: Diagnosis not present

## 2024-08-16 DIAGNOSIS — F418 Other specified anxiety disorders: Secondary | ICD-10-CM | POA: Diagnosis not present

## 2024-08-16 DIAGNOSIS — Z7985 Long-term (current) use of injectable non-insulin antidiabetic drugs: Secondary | ICD-10-CM | POA: Diagnosis not present

## 2024-08-16 NOTE — Patient Instructions (Signed)
 Prerna Quintanilla - I am sorry I was unable to reach you today for our initial scheduled appointment. I work with [name], Krichna, MD and am calling to support your healthcare needs. Please contact me at [phone] at your earliest convenience. I look forward to speaking with you soon.   Thank you,  [name], MSN, RN [facility]  [facility] RN Care Manager Direct Dial : [phone] Fax: [phone]

## 2024-08-17 DIAGNOSIS — I509 Heart failure, unspecified: Secondary | ICD-10-CM | POA: Diagnosis not present

## 2024-08-17 DIAGNOSIS — E1122 Type 2 diabetes mellitus with diabetic chronic kidney disease: Secondary | ICD-10-CM | POA: Diagnosis not present

## 2024-08-17 DIAGNOSIS — R600 Localized edema: Secondary | ICD-10-CM | POA: Diagnosis not present

## 2024-08-17 DIAGNOSIS — U099 Post covid-19 condition, unspecified: Secondary | ICD-10-CM | POA: Diagnosis not present

## 2024-08-17 DIAGNOSIS — M48062 Spinal stenosis, lumbar region with neurogenic claudication: Secondary | ICD-10-CM | POA: Diagnosis not present

## 2024-08-17 DIAGNOSIS — G894 Chronic pain syndrome: Secondary | ICD-10-CM | POA: Diagnosis not present

## 2024-08-17 DIAGNOSIS — I130 Hypertensive heart and chronic kidney disease with heart failure and stage 1 through stage 4 chronic kidney disease, or unspecified chronic kidney disease: Secondary | ICD-10-CM | POA: Diagnosis not present

## 2024-08-17 DIAGNOSIS — N1832 Chronic kidney disease, stage 3b: Secondary | ICD-10-CM | POA: Diagnosis not present

## 2024-08-17 DIAGNOSIS — Z794 Long term (current) use of insulin: Secondary | ICD-10-CM | POA: Diagnosis not present

## 2024-08-17 DIAGNOSIS — I2510 Atherosclerotic heart disease of native coronary artery without angina pectoris: Secondary | ICD-10-CM | POA: Diagnosis not present

## 2024-08-18 DIAGNOSIS — E875 Hyperkalemia: Secondary | ICD-10-CM | POA: Diagnosis not present

## 2024-08-18 DIAGNOSIS — G43909 Migraine, unspecified, not intractable, without status migrainosus: Secondary | ICD-10-CM | POA: Diagnosis not present

## 2024-08-18 DIAGNOSIS — U099 Post covid-19 condition, unspecified: Secondary | ICD-10-CM | POA: Diagnosis not present

## 2024-08-18 DIAGNOSIS — G894 Chronic pain syndrome: Secondary | ICD-10-CM | POA: Diagnosis not present

## 2024-08-18 DIAGNOSIS — M48062 Spinal stenosis, lumbar region with neurogenic claudication: Secondary | ICD-10-CM | POA: Diagnosis not present

## 2024-08-18 DIAGNOSIS — I509 Heart failure, unspecified: Secondary | ICD-10-CM | POA: Diagnosis not present

## 2024-08-18 DIAGNOSIS — N1832 Chronic kidney disease, stage 3b: Secondary | ICD-10-CM | POA: Diagnosis not present

## 2024-08-18 DIAGNOSIS — I2510 Atherosclerotic heart disease of native coronary artery without angina pectoris: Secondary | ICD-10-CM | POA: Diagnosis not present

## 2024-08-18 DIAGNOSIS — I130 Hypertensive heart and chronic kidney disease with heart failure and stage 1 through stage 4 chronic kidney disease, or unspecified chronic kidney disease: Secondary | ICD-10-CM | POA: Diagnosis not present

## 2024-08-18 DIAGNOSIS — Z794 Long term (current) use of insulin: Secondary | ICD-10-CM | POA: Diagnosis not present

## 2024-08-18 DIAGNOSIS — E1122 Type 2 diabetes mellitus with diabetic chronic kidney disease: Secondary | ICD-10-CM | POA: Diagnosis not present

## 2024-08-18 DIAGNOSIS — R600 Localized edema: Secondary | ICD-10-CM | POA: Diagnosis not present

## 2024-08-21 ENCOUNTER — Ambulatory Visit: Admitting: Neurosurgery

## 2024-08-21 ENCOUNTER — Telehealth: Payer: Self-pay

## 2024-08-21 NOTE — Transitions of Care (Post Inpatient/ED Visit) (Unsigned)
   08/21/2024  Name: Sarah Duffy MRN: [id number] DOB: Mar 21, 1971  Today's TOC FU Call Status: Today's TOC FU Call Status:: Unsuccessful Call (1st Attempt) Unsuccessful Call (1st Attempt) Date: 08/21/24  Attempted to reach the patient regarding the most recent Inpatient/ED visit.  Follow Up Plan: Additional outreach attempts will be made to reach the patient to complete the Transitions of Care (Post Inpatient/ED visit) call.   Signature Julian Lemmings, LPN [facility] Nurse Health Advisor Direct Dial  [phone]

## 2024-08-22 NOTE — Transitions of Care (Post Inpatient/ED Visit) (Unsigned)
   08/22/2024  Name: Sarah Duffy MRN: [id number] DOB: 1971/10/11  Today's TOC FU Call Status: Today's TOC FU Call Status:: Unsuccessful Call (2nd Attempt) Unsuccessful Call (1st Attempt) Date: 08/21/24 Unsuccessful Call (2nd Attempt) Date: 08/22/24  Attempted to reach the patient regarding the most recent Inpatient/ED visit.  Follow Up Plan: Additional outreach attempts will be made to reach the patient to complete the Transitions of Care (Post Inpatient/ED visit) call.   Signature Julian Lemmings, LPN [facility] Nurse Health Advisor Direct Dial  [phone]

## 2024-08-23 NOTE — Transitions of Care (Post Inpatient/ED Visit) (Signed)
 08/23/2024  Name: Sarah Duffy MRN: [id number] DOB: 1971-12-05  Today's TOC FU Call Status: Today's TOC FU Call Status:: Successful TOC FU Call Completed Unsuccessful Call (1st Attempt) Date: 08/21/24 Unsuccessful Call (2nd Attempt) Date: 08/22/24 [facility] FU Call Complete Date: 08/23/24 Patient's Name and Date of Birth confirmed.  Transition Care Management Follow-up Telephone Call Date of Discharge: 08/18/24 Discharge Facility: Other [facility]) Name of Other ([facility]) Discharge Facility: [name] Type of Discharge: Inpatient Admission Primary Inpatient Discharge Diagnosis:: spinal stenosis How have you been since you were released from the hospital?: Better Any questions or concerns?: No  Items Reviewed: Did you receive and understand the discharge instructions provided?: Yes Medications obtained,verified, and reconciled?: Yes (Medications Reviewed) Any new allergies since your discharge?: No Dietary orders reviewed?: Yes Do you have support at home?: Yes People in Home [RPT]: sibling(s)  Medications Reviewed Today: Medications Reviewed Today     Reviewed by Emmitt Pan, LPN (Licensed Practical Nurse) on 08/23/24 at 1034  Med List Status: <None>   Medication Order Taking? Sig Documenting Provider Last Dose Status Informant  albuterol  (VENTOLIN  HFA) 108 (90 Base) MCG/ACT inhaler [id number] Yes INHALE 2 PUFFS BY MOUTH EVERY 4 HOURS AS NEEDED FOR WHEEZE OR FOR SHORTNESS OF BREATH [name], Krichna, MD  Active Self, Pharmacy Records           Med Note [facility]   Thu Jul 20, 2024  2:11 PM)    amLODipine  (NORVASC ) 5 MG tablet [id number] Yes Take 1 tablet (5 mg total) by mouth daily. [name], Krichna, MD  Active Self, Pharmacy Records  aspirin  EC 81 MG tablet [id number] Yes Take by mouth. [provider]  Active Self, Pharmacy Records  atorvastatin  (LIPITOR) 80 MG tablet [id number] Yes Take 1 tablet (80 mg total) by mouth daily. [name], Krichna, MD   Active Self, Pharmacy Records  Blood Glucose Monitoring Suppl (ACCU-CHEK GUIDE) w/Device PRESSLEY [id number] Yes Use to check blood sugar 2 time daily as needed [name], Ibtehal Jaralla, MD  Active Self, Pharmacy Records  carvedilol  (COREG ) 12.5 MG tablet [id number] Yes TAKE 1 TABLET BY MOUTH 2 TIMES DAILY. Gerard Frederick, NP  Active Self, Pharmacy Records  Continuous Glucose Sensor (DEXCOM G7 SENSOR) [place] [id number] Yes 1 DEVICE BY DOES NOT APPLY ROUTE AS DIRECTED. [name], Donell Cardinal, MD  Active Self, Pharmacy Records  ENTRESTO  24-26 MG [id number] Yes Take 1 tablet by mouth 2 (two) times daily. Gerard Frederick, NP  Active Self, Pharmacy Records  ezetimibe  (ZETIA ) 10 MG tablet [id number] Yes Take 1 tablet (10 mg total) by mouth daily. [name], Krichna, MD  Active Self, Pharmacy Records  gabapentin  (NEURONTIN ) 800 MG tablet [id number] Yes Take 800 mg by mouth 3 (three) times daily. [provider]  Active Self, Pharmacy Records  Galcanezumab -gnlm (EMGALITY ) 120 MG/ML SOSY [id number] Yes Inject 1 mL into the skin every 30 (thirty) days. [name], Krichna, MD  Active Self, Pharmacy Records  glucose blood (ACCU-CHEK GUIDE) test strip [id number] Yes Check blood sugar 2 times daily as needed [name], Ibtehal Jaralla, MD  Active Self, Pharmacy Records  insulin  aspart (NOVOLOG ) 100 UNIT/ML injection [id number] Yes Inject 10 Units into the skin with breakfast, with lunch, and with evening meal. Wouk, Devaughn Sayres, MD  Active   Insulin  Glargine (BASAGLAR  [facility]) 100 UNIT/ML [id number] Yes Inject 70 Units into the skin daily. [name], Donell Cardinal, MD  Active Self, Pharmacy Records  Insulin  Pen Needle 32G X 4 MM MISC [id number] Yes 1 Device by Does not apply route in the  morning, at noon, in the evening, and at bedtime. [name], Donell Cardinal, MD  Active Self, Pharmacy Records  ipratropium (ATROVENT ) 0.03 % nasal spray [id number] Yes Place 2 sprays into both nostrils 2 (two) times daily. [name],  Krichna, MD  Active Self, Pharmacy Records  JARDIANCE  25 MG TABS tablet [id number] Yes Take 1 tablet (25 mg total) by mouth daily. [name], Donell Cardinal, MD  Active Self, Pharmacy Records  Lancets [facility] MULTICLIX) lancets [id number] Yes Check sugar 2 times daily as needed. [name], Donell Cardinal, MD  Active Self, Pharmacy Records  levocetirizine (XYZAL ) 5 MG tablet [id number] Yes Take 1 tablet (5 mg total) by mouth in the morning. [name], Krichna, MD  Active Self, Pharmacy Records  linaclotide  (LINZESS ) 290 MCG CAPS capsule [id number] Yes Take 1 capsule (290 mcg total) by mouth daily before breakfast. Therisa Bi, MD  Active Self, Pharmacy Records  nitroGLYCERIN  (NITROSTAT ) 0.4 MG SL tablet [id number] Yes PLACE 1 TABLET UNDER THE TONGUE EVERY 5 MINUTES AS NEEDED FOR CHEST PAIN. Darliss Rogue, MD  Active Self, Pharmacy Records  OZEMPIC , 2 MG/DOSE, 8 MG/3ML SOPN [id number] Yes Inject 2 mg into the skin once a week. [provider]  Active Self, Pharmacy Records  Rimegepant Sulfate  (NURTEC) 75 MG TBDP [id number] Yes Take 1 tablet (75 mg total) by mouth daily as needed. Max of every other day prn [name], Krichna, MD  Active Self, Pharmacy Records  spironolactone  (ALDACTONE ) 25 MG tablet [id number] Yes Take 1 tablet (25 mg total) by mouth daily. Darliss Rogue, MD  Active Self, Pharmacy Records  traMADol  (ULTRAM ) 50 MG tablet [id number] Yes Take 1 tablet (50 mg total) by mouth every 6 (six) hours as needed for severe pain (pain score 7-10). Wouk, Devaughn Sayres, MD  Active             Home Care and Equipment/Supplies: Were Home Health Services Ordered?: NA Any new equipment or medical supplies ordered?: NA  Functional Questionnaire: Do you need assistance with bathing/showering or dressing?: No Do you need assistance with meal preparation?: No Do you need assistance with eating?: No Do you have difficulty maintaining continence: No Do you need assistance with getting out  of bed/getting out of a chair/moving?: No Do you have difficulty managing or taking your medications?: No  Follow up appointments reviewed: PCP Follow-up appointment confirmed?: Yes Date of PCP follow-up appointment?: 08/28/24 Follow-up Provider: [facility] Follow-up appointment confirmed?: NA Do you need transportation to your follow-up appointment?: No Do you understand care options if your condition(s) worsen?: Yes-patient verbalized understanding    SIGNATURE Julian Lemmings, LPN [facility] Nurse Health Advisor Direct Dial  [phone]

## 2024-08-25 ENCOUNTER — Other Ambulatory Visit: Payer: Self-pay

## 2024-08-25 VITALS — BP 131/79

## 2024-08-25 DIAGNOSIS — F32A Depression, unspecified: Secondary | ICD-10-CM

## 2024-08-25 DIAGNOSIS — Z599 Problem related to housing and economic circumstances, unspecified: Secondary | ICD-10-CM

## 2024-08-25 NOTE — Patient Instructions (Signed)
 Visit Information  Sarah Duffy was given information about Medicaid Managed Care team care coordination services as a part of their [facility] Community Plan Medicaid benefit.   If you would like to schedule transportation through your [facility], please call the following number at least 2 days in advance of your appointment: [phone]   Rides for urgent appointments can also be made after hours by calling Member Services.  Call the Behavioral Health Crisis Line at [phone], at any time, 24 hours a day, 7 days a week. If you are in danger or need immediate medical attention call 911.   Sarah Duffy - following are the goals we discussed in your visit today:   Goals Addressed             This Visit's Progress    VBCI RN Care Plan - CHF/HTN       Problems:  Chronic Disease Management support and education needs related to CHF and HTN  Goal: Over the next 90 days the Patient will attend all scheduled medical appointments: Patient will attend all appointments as evidenced by chart review        continue to work with RN Care Manager and/or Social Worker to address care management and care coordination needs related to CHF as evidenced by adherence to care management team scheduled appointments     take all medications exactly as prescribed and will call provider for medication related questions as evidenced by patient report    verbalize basic understanding of CHF and HTN disease process and self health management plan as evidenced by taking medications as prescribed, checking BP daily, purchasing scale and checking weight daily, until scale, assess for other sx of HF using Action plan and education provided, lifestyle modifications including low salt cardiac healthy diet, exercise  Interventions:   Heart Failure Interventions: Basic overview and discussion of pathophysiology of Heart Failure reviewed Provided education on low sodium diet Reviewed Heart  Failure Action Plan in depth and provided written copy Assessed need for readable accurate scales in home Discussed importance of daily weight and advised patient to weigh and record daily Reviewed role of diuretics in prevention of fluid overload and management of heart failure; Discussed the importance of keeping all appointments with provider Advised patient to discuss medications received from Rehab (not taking) with provider Screening for signs and symptoms of depression related to chronic disease state  Assessed social determinant of health barriers   Hypertension Interventions: Last practice recorded BP readings:  BP Readings from Last 3 Encounters:  08/25/24 131/79  07/27/24 130/67  07/05/24 102/62   Most recent eGFR/CrCl:  Lab Results  Component Value Date   EGFR 58 (L) 12/10/2023    No components found for: CRCL  Evaluation of current treatment plan related to hypertension self management and patient's adherence to plan as established by provider Provided education to patient re: stroke prevention, s/s of heart attack and stroke Reviewed medications with patient and discussed importance of compliance Discussed plans with patient for ongoing care management follow up and provided patient with direct contact information for care management team Advised patient, providing education and rationale, to monitor blood pressure daily and record, calling PCP for findings outside established parameters Provided education on prescribed diet low salt cardiac healthy Discussed complications of poorly controlled blood pressure such as heart disease, stroke, circulatory complications, vision complications, kidney impairment, sexual dysfunction Screening for signs and symptoms of depression related to chronic disease state  Assessed social determinant  of health barriers  Patient Self-Care Activities:  Attend all scheduled provider appointments Call pharmacy for medication refills 3-7  days in advance of running out of medications Call provider office for new concerns or questions  Take medications as prescribed   Work with the social worker to address care coordination needs and will continue to work with the clinical team to address health care and disease management related needs call office if I gain more than 2 pounds in one day or 5 pounds in one week do ankle pumps when sitting keep legs up while sitting track weight in diary use salt in moderation watch for swelling in feet, ankles and legs every day weigh myself daily eat more whole grains, fruits and vegetables, lean meats and healthy fats check blood pressure daily learn about high blood pressure keep a blood pressure log take blood pressure log to all doctor appointments call doctor for signs and symptoms of high blood pressure keep all doctor appointments take medications for blood pressure exactly as prescribed report new symptoms to your doctor eat more whole grains, fruits and vegetables, lean meats and healthy fats  Plan:  Telephone follow up appointment with care management team member scheduled for:  09/08/2024 3:00 pm          VBCI RN Care Plan - DM       Problems:  Chronic Disease Management support and education needs related to DMII  Goal: Over the next 90 days the Patient will attend all scheduled medical appointments: patient will attend all appointments as evidenced by chart review        continue to work with RN Care Manager and/or Social Worker to address care management and care coordination needs related to DMII as evidenced by adherence to care management team scheduled appointments     take all medications exactly as prescribed and will call provider for medication related questions as evidenced by reporting compliance with all medications    verbalize basic understanding of DMII disease process and self health management plan as evidenced by taking all medications as ordered,  checking FBG daily, lifestyle modifications including diabetic diet, exercise as tolerated, attending DM classes through endocrinology  Interventions:   Diabetes Interventions: Assessed patient's understanding of A1c goal: <7% Provided education to patient about basic DM disease process Reviewed medications with patient and discussed importance of medication adherence Counseled on importance of regular laboratory monitoring as prescribed Discussed plans with patient for ongoing care management follow up and provided patient with direct contact information for care management team Provided patient with written educational materials related to hypo and hyperglycemia and importance of correct treatment Reviewed scheduled/upcoming provider appointments including: DM Education & PCP Advised patient, providing education and rationale, to check cbg using Dexcom 7 and record, calling Endocrinology  for findings outside established parameters Referral made to social work team for assistance with Financial strain - BSW appointment 08/31/24 at 11:00 am Review of patient status, including review of consultants reports, relevant laboratory and other test results, and medications completed Screening for signs and symptoms of depression related to chronic disease state - Referral to LCSW - sadness, depression 09/13/2024 at 10:00 am Assessed social determinant of health barriers - Multiple needs Lab Results  Component Value Date   HGBA1C 10.4 (H) 07/21/2024    Patient Self-Care Activities:  Attend all scheduled provider appointments Call pharmacy for medication refills 3-7 days in advance of running out of medications Call provider office for new concerns or questions  Take medications  as prescribed   Work with the Child psychotherapist to address care coordination needs and will continue to work with the clinical team to address health care and disease management related needs keep appointment with eye  doctor check blood sugar at prescribed times: FBG and Per endocrinology - Dexcom 7 check feet daily for cuts, sores or redness drink 6 to 8 glasses of water each day fill half of plate with vegetables limit fast food meals to no more than 1 per week manage portion size prepare main meal at home 3 to 5 days each week switch to low-fat or skim milk switch to sugar-free drinks do heel pump exercise 2 to 3 times each day keep feet up while sitting wash and dry feet carefully every day wear comfortable, cotton socks wear comfortable, well-fitting shoes Take dexcom to all appointments for download  Plan:  Telephone follow up appointment with care management team member scheduled for:  09/08/2024 at 3:00 pm             Please see education materials related to DM, HTN, CHF provided by MyChart link.  Patient verbalizes understanding of instructions and care plan provided today and agrees to view in MyChart. Active MyChart status and patient understanding of how to access instructions and care plan via MyChart confirmed with patient.     Telephone follow up appointment with Managed Medicaid care management team member scheduled for:09/08/2024 at 3:00 pm  [name], MSN, RN [facility]  [facility], [facility] RN Care Manager Direct Dial : [phone] Fax: [phone]  Fall Prevention in the Home, Adult Falls can cause injuries and can happen to people of all ages. There are many things you can do to make your home safer and to help prevent falls. What actions can I take to prevent falls? General information Use good lighting in all rooms. Make sure to: Replace any light bulbs that burn out. Turn on the lights in dark areas and use night-lights. Keep items that you use often in easy-to-reach places. Lower the shelves around your home if needed. Move furniture so that there are clear paths around it. Do not use throw rugs or other things on the floor  that can make you trip. If any of your floors are uneven, fix them. Add color or contrast paint or tape to clearly mark and help you see: Grab bars or handrails. First and last steps of staircases. Where the edge of each step is. If you use a ladder or stepladder: Make sure that it is fully opened. Do not climb a closed ladder. Make sure the sides of the ladder are locked in place. Have someone hold the ladder while you use it. Know where your pets are as you move through your home. What can I do in the bathroom?     Keep the floor dry. Clean up any water on the floor right away. Remove soap buildup in the bathtub or shower. Buildup makes bathtubs and showers slippery. Use non-skid mats or decals on the floor of the bathtub or shower. Attach bath mats securely with double-sided, non-slip rug tape. If you need to sit down in the shower, use a non-slip stool. Install grab bars by the toilet and in the bathtub and shower. Do not use towel bars as grab bars. What can I do in the bedroom? Make sure that you have a light by your bed that is easy to reach. Do not use any sheets or blankets on your bed that  hang to the floor. Have a firm chair or bench with side arms that you can use for support when you get dressed. What can I do in the kitchen? Clean up any spills right away. If you need to reach something above you, use a step stool with a grab bar. Keep electrical cords out of the way. Do not use floor polish or wax that makes floors slippery. What can I do with my stairs? Do not leave anything on the stairs. Make sure that you have a light switch at the top and the bottom of the stairs. Make sure that there are handrails on both sides of the stairs. Fix handrails that are broken or loose. Install non-slip stair treads on all your stairs if they do not have carpet. Avoid having throw rugs at the top or bottom of the stairs. Choose a carpet that does not hide the edge of the steps on  the stairs. Make sure that the carpet is firmly attached to the stairs. Fix carpet that is loose or worn. What can I do on the outside of my home? Use bright outdoor lighting. Fix the edges of walkways and driveways and fix any cracks. Clear paths of anything that can make you trip, such as tools or rocks. Add color or contrast paint or tape to clearly mark and help you see anything that might make you trip as you walk through a door, such as a raised step or threshold. Trim any bushes or trees on paths to your home. Check to see if handrails are loose or broken and that both sides of all steps have handrails. Install guardrails along the edges of any raised decks and porches. Have leaves, snow, or ice cleared regularly. Use sand, salt, or ice melter on paths if you live where there is ice and snow during the winter. Clean up any spills in your garage right away. This includes grease or oil spills. What other actions can I take? Review your medicines with your doctor. Some medicines can cause dizziness or changes in blood pressure, which increase your risk of falling. Wear shoes that: Have a low heel. Do not wear high heels. Have rubber bottoms and are closed at the toe. Feel good on your feet and fit well. Use tools that help you move around if needed. These include: Canes. Walkers. Scooters. Crutches. Ask your doctor what else you can do to help prevent falls. This may include seeing a physical therapist to learn to do exercises to move better and get stronger. Where to find more information Centers for Disease Control and Prevention, STEADI: TonerPromos.no [employer] on Aging: BaseRingTones.pl National Institute on Aging: BaseRingTones.pl Contact a doctor if: You are afraid of falling at home. You feel weak, drowsy, or dizzy at home. You fall at home. Get help right away if you: Lose consciousness or have trouble moving after a fall. Have a fall that causes a head injury. These symptoms may  be an emergency. Get help right away. Call 911. Do not wait to see if the symptoms will go away. Do not drive yourself to the hospital. This information is not intended to replace advice given to you by your health care provider. Make sure you discuss any questions you have with your health care provider. Document Revised: 08/03/2022 Document Reviewed: 08/03/2022 Elsevier Patient Education  2024 Elsevier Inc.  Hypocalcemia, Adult Hypocalcemia is when the level of calcium  in a person's blood is below normal. Calcium  is a mineral that is  used by the body in many ways. Not having enough blood calcium  can affect the nervous system. This can lead to problems with the muscles, the heart, and the brain. What are the causes? This condition may be caused by: A deficiency of vitamin D or magnesium  or both. Decreased levels of parathyroid hormone (hypoparathyroidism). Kidney function problems. Low levels of a body protein called albumin. Inflammation of the pancreas (pancreatitis). Not taking in enough vitamins and minerals in the diet or having intestinal problems that interfere with absorption of nutrients. Certain medicines. What are the signs or symptoms? Symptoms of this condition may include: Numbness and tingling in the fingers, toes, or around the mouth. Muscle twitching, aches, or cramps, especially in the legs, feet, and back. Spasm of the voice box. This may make it difficult to breathe or speak. Fast heartbeats (palpitations) and abnormal heart rhythms (dysrhythmias). Shaking uncontrollably (seizures). Memory problems, confusion, or difficulty thinking. Depression, anxiety, irritability, or changes in personality. Long-term (chronic) symptoms of this condition may include: Coarse, brittle hair and nails. Dry skin or lasting skin diseases. These include psoriasis, eczema, or dermatitis. Dental cavities. Clouding of the eye lens (cataracts). Some people may not have any symptoms,  especially if they have chronic hypocalcemia. How is this diagnosed?  This condition is usually diagnosed with a blood test. You may also have other tests to help determine the underlying cause of the condition. This may include more blood tests and imaging tests. How is this treated? This condition may be treated with: Calcium  given by mouth or through an IV. The method used for giving calcium  will depend on the severity of the condition. If your condition is severe, you may need to be closely monitored in the hospital. Giving other minerals (electrolytes), such as magnesium . Other treatment will depend on the cause of the condition. Follow these instructions at home: Follow diet instructions from your health care provider or dietitian. Take supplements only as told by your health care provider. Do not take an iron supplement within 2 hours of taking a calcium  supplement. Keep all follow-up visits. This is important. Contact a health care provider if: You have increased muscle twitching or cramps. You have new swelling in the feet, ankles, or legs. You develop changes in mood, memory, or personality. Get help right away if: You have chest pain. You have persistent rapid or irregular heartbeats. You have trouble breathing. You faint. You start to have seizures. You have confusion. These symptoms may represent a serious problem that is an emergency. Do not wait to see if the symptoms will go away. Get medical help right away. Call your local emergency services (911 in the [place] Do not drive yourself to the hospital. Summary Hypocalcemia is when the level of calcium  in a person's blood is below normal. Not having enough blood calcium  can affect the nervous system. This condition may be treated with calcium  given by mouth or through an IV, or by receiving other minerals. Other treatment depends on the cause of the condition. Take supplements only as told by your health care  provider. Contact a health care provider if you have new or worsening symptoms. Keep all follow-up visits. This is important. This information is not intended to replace advice given to you by your health care provider. Make sure you discuss any questions you have with your health care provider. Document Revised: 05/07/2021 Document Reviewed: 05/07/2021 Elsevier Patient Education  2024 Elsevier Inc.  Heart Failure Action Plan A heart failure action plan  helps you know what to do when you have symptoms of heart failure. Your action plan is a color-coded plan that lists the symptoms to watch for and indicates what actions to take. If you have symptoms in the green zone, you're doing well. If you have symptoms in the yellow zone, you're having problems. If you have symptoms in the red zone, you need medical care right away. Follow the plan that was created by you and your health care provider. Review your plan each time you visit your provider. Green zone These signs mean you're doing well and can continue what you're doing: You don't have new or worsening shortness of breath. You have very little swelling or no new swelling. Your weight is stable (no gain or loss). You have a normal activity level. You don't have chest pain or any other new symptoms. Yellow zone These signs and symptoms mean your condition may be getting worse and you should make some changes: You have trouble breathing when you're active. You have swelling in your feet or legs or have discomfort in your belly. You gain 2-3 lb (0.9-1.4 kg) in 24 hours, or 5 lb (2.3 kg) in a week. This amount may be more or less depending on your condition. You get tired easily. You have trouble sleeping. You have a dry cough. If you have any of these symptoms: Contact your provider within the next day. Your provider may adjust your medicines. Red zone These signs and symptoms mean you should get medical help right away: You have  trouble breathing when resting or cannot lie flat and you need to raise your head to help you breathe. You have a dry cough that's getting worse. You have swelling or pain in your feet or legs or discomfort in your belly that's getting worse. You suddenly gain more than 2-3 lb (0.9-1.4 kg) in 24 hours, or more than 5 lb (2.3 kg) in a week. This amount may be more or less depending on your condition. You have trouble staying awake or you feel confused. You don't have an appetite. You have worsening sadness or depression. These symptoms may be an emergency. Call 911 right away. Do not wait to see if the symptoms will go away. Do not drive yourself to the hospital. Follow these instructions at home: Take medicines only as told. Eat a heart-healthy diet. Work with a dietitian to create an eating plan that's best for you. Weigh yourself each day. Your target weight is __________ lb (__________ kg). Call your provider if you gain more than __________ lb (__________ kg) in 24 hours, or more than __________ lb (__________ kg) in a week. Health care provider name: _____________________________________________________ Health care provider phone number: _____________________________________________________ Where to find more information American Heart Association: heart.org This information is not intended to replace advice given to you by your health care provider. Make sure you discuss any questions you have with your health care provider. Document Revised: 07/15/2023 Document Reviewed: 07/15/2023 Elsevier Patient Education  2024 Elsevier Inc.  Managing Your Hypertension Hypertension, also called high blood pressure, is when the force of the blood pressing against the walls of the arteries is too strong. Arteries are blood vessels that carry blood from your heart throughout your body. Hypertension forces the heart to work harder to pump blood and may cause the arteries to become narrow or  stiff. Understanding blood pressure readings A blood pressure reading includes a higher number over a lower number: The first, or top, number is  called the systolic pressure. It is a measure of the pressure in your arteries as your heart beats. The second, or bottom number, is called the diastolic pressure. It is a measure of the pressure in your arteries as the heart relaxes. For most people, a normal blood pressure is below 120/80. Your personal target blood pressure may vary depending on your medical conditions, your age, and other factors. Blood pressure is classified into four stages. Based on your blood pressure reading, your health care provider may use the following stages to determine what type of treatment you need, if any. Systolic pressure and diastolic pressure are measured in a unit called millimeters of mercury (mmHg). Normal Systolic pressure: below 120. Diastolic pressure: below 80. Elevated Systolic pressure: 120-129. Diastolic pressure: below 80. Hypertension stage 1 Systolic pressure: 130-139. Diastolic pressure: 80-89. Hypertension stage 2 Systolic pressure: 140 or above. Diastolic pressure: 90 or above. How can this condition affect me? Managing your hypertension is very important. Over time, hypertension can damage the arteries and decrease blood flow to parts of the body, including the brain, heart, and kidneys. Having untreated or uncontrolled hypertension can lead to: A heart attack. A stroke. A weakened blood vessel (aneurysm). Heart failure. Kidney damage. Eye damage. Memory and concentration problems. Vascular dementia. What actions can I take to manage this condition? Hypertension can be managed by making lifestyle changes and possibly by taking medicines. Your health care provider will help you make a plan to bring your blood pressure within a normal range. You may be referred for counseling on a healthy diet and physical activity. Nutrition  Eat a diet  that is high in fiber and potassium, and low in salt (sodium), added sugar, and fat. An example eating plan is called the DASH diet. DASH stands for Dietary Approaches to Stop Hypertension. To eat this way: Eat plenty of fresh fruits and vegetables. Try to fill one-half of your plate at each meal with fruits and vegetables. Eat whole grains, such as whole-wheat pasta, brown rice, or whole-grain bread. Fill about one-fourth of your plate with whole grains. Eat low-fat dairy products. Avoid fatty cuts of meat, processed or cured meats, and poultry with skin. Fill about one-fourth of your plate with lean proteins such as fish, chicken without skin, beans, eggs, and tofu. Avoid pre-made and processed foods. These tend to be higher in sodium, added sugar, and fat. Reduce your daily sodium intake. Many people with hypertension should eat less than 1,500 mg of sodium a day. Lifestyle  Work with your health care provider to maintain a healthy body weight or to lose weight. Ask what an ideal weight is for you. Get at least 30 minutes of exercise that causes your heart to beat faster (aerobic exercise) most days of the week. Activities may include walking, swimming, or biking. Include exercise to strengthen your muscles (resistance exercise), such as weight lifting, as part of your weekly exercise routine. Try to do these types of exercises for 30 minutes at least 3 days a week. Do not use any products that contain nicotine or tobacco. These products include cigarettes, chewing tobacco, and vaping devices, such as e-cigarettes. If you need help quitting, ask your health care provider. Control any long-term (chronic) conditions you have, such as high cholesterol or diabetes. Identify your sources of stress and find ways to manage stress. This may include meditation, deep breathing, or making time for fun activities. Alcohol use Do not drink alcohol if: Your health care provider tells you not  to drink. You are  pregnant, may be pregnant, or are planning to become pregnant. If you drink alcohol: Limit how much you have to: 0-1 drink a day for women. 0-2 drinks a day for men. Know how much alcohol is in your drink. In the [place] one drink equals one 12 oz bottle of beer (355 mL), one 5 oz glass of wine (148 mL), or one 1 oz glass of hard liquor (44 mL). Medicines Your health care provider may prescribe medicine if lifestyle changes are not enough to get your blood pressure under control and if: Your systolic blood pressure is 130 or higher. Your diastolic blood pressure is 80 or higher. Take medicines only as told by your health care provider. Follow the directions carefully. Blood pressure medicines must be taken as told by your health care provider. The medicine does not work as well when you skip doses. Skipping doses also puts you at risk for problems. Monitoring Before you monitor your blood pressure: Do not smoke, drink caffeinated beverages, or exercise within 30 minutes before taking a measurement. Use the bathroom and empty your bladder (urinate). Sit quietly for at least 5 minutes before taking measurements. Monitor your blood pressure at home as told by your health care provider. To do this: Sit with your back straight and supported. Place your feet flat on the floor. Do not cross your legs. Support your arm on a flat surface, such as a table. Make sure your upper arm is at heart level. Each time you measure, take two or three readings one minute apart and record the results. You may also need to have your blood pressure checked regularly by your health care provider. General information Talk with your health care provider about your diet, exercise habits, and other lifestyle factors that may be contributing to hypertension. Review all the medicines you take with your health care provider because there may be side effects or interactions. Keep all follow-up visits. Your health care  provider can help you create and adjust your plan for managing your high blood pressure. Where to find more information National Heart, Lung, and Blood Institute: PopSteam.is American Heart Association: www.heart.org Contact a health care provider if: You think you are having a reaction to medicines you have taken. You have repeated (recurrent) headaches. You feel dizzy. You have swelling in your ankles. You have trouble with your vision. Get help right away if: You develop a severe headache or confusion. You have unusual weakness or numbness, or you feel faint. You have severe pain in your chest or abdomen. You vomit repeatedly. You have trouble breathing. These symptoms may be an emergency. Get help right away. Call 911. Do not wait to see if the symptoms will go away. Do not drive yourself to the hospital. Summary Hypertension is when the force of blood pumping through your arteries is too strong. If this condition is not controlled, it may put you at risk for serious complications. Your personal target blood pressure may vary depending on your medical conditions, your age, and other factors. For most people, a normal blood pressure is less than 120/80. Hypertension is managed by lifestyle changes, medicines, or both. Lifestyle changes to help manage hypertension include losing weight, eating a healthy, low-sodium diet, exercising more, stopping smoking, and limiting alcohol. This information is not intended to replace advice given to you by your health care provider. Make sure you discuss any questions you have with your health care provider. Document Revised: 08/14/2021 Document Reviewed: 08/14/2021  Elsevier Patient Education  2024 Elsevier Inc.Type 2 Diabetes Mellitus, Self-Care, Adult Caring for yourself after you have been diagnosed with type 2 diabetes (type 2 diabetes mellitus) means keeping your blood sugar (glucose) under control with a balance  of: Nutrition. Exercise. Lifestyle changes. Medicines or insulin , if needed. Support from your team of health care providers and others. What are the risks? Having type 2 diabetes can put you at risk for other long-term (chronic) conditions, such as heart disease and kidney disease. Your health care provider may prescribe medicines to help prevent complications from diabetes. How to monitor your blood glucose  Check your blood glucose every day or as often as told by your health care provider. Have your A1C (hemoglobin A1C) level checked two or more times a year, or as often as told by your health care provider. Your health care provider will set personalized treatment goals for you. Generally, the goal of treatment is to maintain the following blood glucose levels: Before meals: 80-130 mg/dL (4.4-7.2 mmol/L). After meals: below 180 mg/dL (10 mmol/L). A1C level: less than 7%. How to manage hyperglycemia and hypoglycemia Hyperglycemia symptoms Hyperglycemia, also called high blood glucose, occurs when blood glucose is too high. Make sure you know the early signs of hyperglycemia, such as: Increased thirst. Hunger. Feeling very tired. Needing to urinate more often than usual. Blurry vision. Hypoglycemia symptoms Hypoglycemia, also called low blood glucose, occurs with a blood glucose level at or below 70 mg/dL (3.9 mmol/L). Diabetes medicines lower your blood glucose and can cause hypoglycemia. The risk for hypoglycemia increases during or after exercise, during sleep, during illness, and when skipping meals or not eating for a long time (fasting). It is important to know the symptoms of hypoglycemia and treat it right away. Always have a 15-gram rapid-acting carbohydrate snack with you to treat low blood glucose. Family members and close friends should also know the symptoms and understand how to treat hypoglycemia, in case you are not able to treat yourself. Symptoms may  include: Hunger. Anxiety. Sweating and feeling clammy. Dizziness or feeling light-headed. Sleepiness. Increased heart rate. Irritability. Tingling or numbness around the mouth, lips, or tongue. Restless sleep. Severe hypoglycemia is when your blood glucose level is at or below 54 mg/dL (3 mmol/L). Severe hypoglycemia is an emergency. Do not wait to see if the symptoms will go away. Get medical help right away. Call your local emergency services (911 in the [place] Do not drive yourself to the hospital. If you have severe hypoglycemia and you cannot eat or drink, you may need glucagon. A family member or close friend should learn how to check your blood glucose and how to give you glucagon. Ask your health care provider if you need to have an emergency glucagon kit available. Follow these instructions at home: Medicines Take prescribed insulin  or diabetes medicines as told by your health care provider. Do not run out of insulin  or other diabetes medicines. Plan ahead so you always have these available. If you use insulin , adjust your dosage based on your physical activity and what foods you eat. Your health care provider will tell you how to adjust your dosage. Take over-the-counter and prescription medicines only as told by your health care provider. Eating and drinking  What you eat and drink affects your blood glucose and your insulin  dosage. Making good choices helps to control your diabetes and prevent other health problems. A healthy meal plan includes eating lean proteins, complex carbohydrates, fresh fruits and vegetables, low-fat dairy  products, and healthy fats. Make an appointment to see a registered dietitian to help you create an eating plan that is right for you. Make sure that you: Follow instructions from your health care provider about eating or drinking restrictions. Drink enough fluid to keep your urine pale yellow. Keep a record of the carbohydrates that you eat. Do this by  reading food labels and learning the standard serving sizes of foods. Follow your sick-day plan whenever you cannot eat or drink as usual. Make this plan in advance with your health care provider.  Activity Stay active. Exercise regularly, as told by your health care provider. This may include: Stretching and doing strength exercises, such as yoga or weight lifting, two or more times a week. Doing 150 minutes or more of moderate-intensity or vigorous-intensity exercise each week. This could be brisk walking, biking, or water aerobics. Spread out your activity over 3 or more days of the week. Do not go more than 2 days in a row without doing some kind of physical activity. When you start a new exercise or activity, work with your health care provider to adjust your insulin , medicines, or food intake as needed. Lifestyle Do not use any products that contain nicotine or tobacco. These products include cigarettes, chewing tobacco, and vaping devices, such as e-cigarettes. If you need help quitting, ask your health care provider. If you drink alcohol and your health care provider says that it is safe for you: Limit how much you have to: 0-1 drink a day for women who are not pregnant. 0-2 drinks a day for men. Know how much alcohol is in your drink. In the [place] one drink equals one 12 oz bottle of beer (355 mL), one 5 oz glass of wine (148 mL), or one 1 oz glass of hard liquor (44 mL). Learn to manage stress. If you need help with this, ask your health care provider. Take care of your body  Keep your immunizations up to date. In addition to getting vaccinations as told by your health care provider, it is recommended that you get vaccinated against the following illnesses: The flu (influenza). Get a flu shot every year. Pneumonia. Hepatitis B. Schedule an eye exam soon after your diagnosis, and then one time every year after that. Check your skin and feet every day for cuts, bruises, redness,  blisters, or sores. Schedule a foot exam with your health care provider once every year. Brush your teeth and gums two times a day, and floss one or more times a day. Visit your dentist one or more times every 6 months. Maintain a healthy weight. General instructions Share your diabetes management plan with people in your workplace, school, and household. Carry a medical alert card or wear medical alert jewelry. Keep all follow-up visits. This is important. Questions to ask your health care provider Should I meet with a certified diabetes care and education specialist? Where can I find a support group for people with diabetes? Where to find more information For help and guidance and for more information about diabetes, please visit: American Diabetes Association (ADA): www.diabetes.org American Association of Diabetes Care and Education Specialists (ADCES): www.diabeteseducator.org International Diabetes Federation (IDF): DCOnly.dk Summary Caring for yourself after you have been diagnosed with type 2 diabetes (type 2 diabetes mellitus) means keeping your blood sugar (glucose) under control with a balance of nutrition, exercise, lifestyle changes, and medicine. Check your blood glucose every day, as often as told by your health care provider. Having diabetes can  put you at risk for other long-term (chronic) conditions, such as heart disease and kidney disease. Your health care provider may prescribe medicines to help prevent complications from diabetes. Share your diabetes management plan with people in your workplace, school, and household. Keep all follow-up visits. This is important. This information is not intended to replace advice given to you by your health care provider. Make sure you discuss any questions you have with your health care provider. Document Revised: 04/30/2021 Document Reviewed: 04/30/2021 Elsevier Patient Education  2024 [employer].  Following is a copy of your  plan of care:  There are no care plans that you recently modified to display for this patient.

## 2024-08-25 NOTE — Patient Outreach (Signed)
 Complex Care Management   Visit Note  08/25/2024  Name:  Sarah Duffy MRN: [id number] DOB: [birth year]  Situation: Referral received for Complex Care Management related to Heart Failure, Diabetes with Complications, and HTN I obtained verbal consent from Patient.  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 02/2021 Echo: EF 35-40%, glob HK, GrI DD, nl RV fxn, mildly dil LA, mod MR; b. 06/2022 Echo: EF 50-55%, no rwma, GrII DD, nl RV fxn, RVSP , mildly dil LA, mild MR, AoV sclerosis.   CKD (chronic kidney disease), stage III (HCC)    Complication of anesthesia 03/23/2023   Possible gastroparesis based off food in stomach during EGD   Coronary artery disease    a. 04/2021 Cath: LM nl, LAD 70p/m, 70m, 80d, LCX 82m, OM1 40, OM2 99 (fills via collats from OM1), RCA 60d, RPDA 75.   COVID-19 2021   Hyperlipidemia LDL goal <70    Hypertension    Ischemic cardiomyopathy    a. 02/2021 Echo: EF 35-40%, glob HK, GrI DD; b. 06/2022 Echo: EF 50-55%.   Moderate mitral regurgitation    a. 02/2021 Echo: Mod MR.   MRSA infection within last 3 months 02/25/2016   Osteomyelitis of foot (HCC) 08/26/2016   Type II diabetes mellitus (HCC)     Assessment: Patient Reported Symptoms:  Cognitive Cognitive Status: Alert and oriented to person, place, and time, Insightful and able to interpret abstract concepts, Normal speech and language skills Cognitive/Intellectual Conditions Management [RPT]: None reported or documented in medical history or problem list   Health Maintenance Behaviors: Annual physical exam Healing Pattern: Average  Neurological   Neurological Management Strategies: Routine screening Neurological Comment: back and neck issues - dizziness from cervical nerves per neurosurgeon, at times random dizziness  HEENT HEENT Symptoms Reported: No symptoms  reported HEENT Management Strategies: Routine screening    Cardiovascular Cardiovascular Symptoms Reported: No symptoms reported Does patient have uncontrolled Hypertension?: No Cardiovascular Management Strategies: Medication therapy, Routine screening Cardiovascular Comment: denies HF sx, does not have scale, no swelling except in rehab,  Respiratory Respiratory Symptoms Reported: Dry cough, Shortness of breath Other Respiratory Symptoms: post COVID dry cough and SOB post COVID, recent chills yesterday, also fatigue, given red flags for ED and PCP appointment 08/28/24 Respiratory Management Strategies: Routine screening  Endocrine Is patient diabetic?: Yes Is patient checking blood sugars at home?: Yes List most recent blood sugar readings, include date and time of day: Dexcom G7 - lows 59-60 late night early monring, treating appropriately ranging 59-110 fasting , reports having snacks before bed, if BG < 150 does not take Basaglar  70U - will try taking earlier (was 11-12 pm) and will call endo Endocrine Comment: Dexcom G&, unabl eto pull up FBG currently 201 after lunch about 30 minutes ago, going to [facility] next month - wants omnipod  Gastrointestinal Gastrointestinal Symptoms Reported: No symptoms reported Gastrointestinal Management Strategies: Medication therapy    Genitourinary Genitourinary Symptoms Reported: No symptoms reported    Integumentary Integumentary Symptoms Reported: No symptoms reported    Musculoskeletal Musculoskelatal Symptoms Reviewed: Back pain, Difficulty walking, Unsteady gait, Muscle pain, Limited mobility, Weakness Additional Musculoskeletal Details: back pain 8/10, neck 5-6/10 with feeling of crick in neck when trying to turn, was supposed to get assistive devices after rehab - not received, requesting PT - will message PCP Musculoskeletal Management Strategies: Medication  therapy, Routine screening Falls in the past year?: No Number of falls in past year: 1  or less Was there an injury with Fall?: No Fall Risk Category Calculator: 0 Patient Fall Risk Level: Low Fall Risk Patient at Risk for Falls Due to: Impaired balance/gait, Impaired mobility, Orthopedic patient Fall risk Follow up: Falls evaluation completed, Falls prevention discussed  Psychosocial Psychosocial Symptoms Reported: Sadness - if selected complete PHQ 2-9 Additional Psychological Details: feels like a burden on family, agreed to LCSW, overwhelmed, frustrated with self   Major Change/Loss/Stressor/Fears (CP): Medical condition, self Techniques to Cope with Loss/Stress/Change: Withdraw Quality of Family Relationships: helpful (somewhat helpful - one sister will help when she wants, cannot ask her, feels like burden, other sister friends prn) Do you feel physically threatened by others?: No    08/25/2024    PHQ2-9 Depression Screening   Little interest or pleasure in doing things More than half the days  Feeling down, depressed, or hopeless Several days  PHQ-2 - Total Score 3  Trouble falling or staying asleep, or sleeping too much More than half the days  Feeling tired or having little energy Nearly every day  Poor appetite or overeating  Not at all  Feeling bad about yourself - or that you are a failure or have let yourself or your family down    Trouble concentrating on things, such as reading the newspaper or watching television Several days  Moving or speaking so slowly that other people could have noticed.  Or the opposite - being so fidgety or restless that you have been moving around a lot more than usual Not at all  Thoughts that you would be better off dead, or hurting yourself in some way Not at all  PHQ2-9 Total Score 9  If you checked off any problems, how difficult have these problems made it for you to do your work, take care of things at home, or get along with other people Somewhat difficult  Depression Interventions/Treatment      Vitals:   08/25/24 1348   BP: 131/79    Medications Reviewed Today     Reviewed by Devra Lands, RN (Registered Nurse) on 08/25/24 at 1327  Med List Status: <None>   Medication Order Taking? Sig Documenting Provider Last Dose Status Informant  albuterol  (VENTOLIN  HFA) 108 (90 Base) MCG/ACT inhaler [id number] Yes INHALE 2 PUFFS BY MOUTH EVERY 4 HOURS AS NEEDED FOR WHEEZE OR FOR SHORTNESS OF BREATH [name], Krichna, MD  Active Self, Pharmacy Records           Med Note CATHY MANCHESTER H   Thu Jul 20, 2024  2:11 PM)    amLODipine  (NORVASC ) 5 MG tablet [id number] Yes Take 1 tablet (5 mg total) by mouth daily. [name], Krichna, MD  Active Self, Pharmacy Records  aspirin  EC 81 MG tablet [id number] Yes Take by mouth. [provider]  Active Self, Pharmacy Records  atorvastatin  (LIPITOR) 80 MG tablet [id number] Yes Take 1 tablet (80 mg total) by mouth daily. [name], Krichna, MD  Active Self, Pharmacy Records  Blood Glucose Monitoring Suppl (ACCU-CHEK GUIDE) w/Device PRESSLEY [id number] Yes Use to check blood sugar 2 time daily as needed [name], Ibtehal Jaralla, MD  Active Self, Pharmacy Records  carvedilol  (COREG ) 12.5 MG tablet [id number] Yes TAKE 1 TABLET BY MOUTH 2 TIMES DAILY. Gerard Frederick, NP  Active Self, Pharmacy Records  Continuous Glucose Sensor (DEXCOM G7 SENSOR) [place] [id number]  1 DEVICE BY DOES NOT APPLY ROUTE AS DIRECTED. [name], Ibtehal Jaralla,  MD  Active Self, Pharmacy Records  ENTRESTO  24-26 MG [id number] Yes Take 1 tablet by mouth 2 (two) times daily. Gerard Frederick, NP  Active Self, Pharmacy Records  ezetimibe  (ZETIA ) 10 MG tablet [id number] Yes Take 1 tablet (10 mg total) by mouth daily. [name], Krichna, MD  Active Self, Pharmacy Records  gabapentin  (NEURONTIN ) 800 MG tablet [id number] Yes Take 800 mg by mouth 3 (three) times daily. [provider]  Active Self, Pharmacy Records  Galcanezumab -gnlm (EMGALITY ) 120 MG/ML SOSY [id number] Yes Inject 1 mL into the skin every 30 (thirty) days.  [name], Krichna, MD  Active Self, Pharmacy Records  glucose blood (ACCU-CHEK GUIDE) test strip [id number] Yes Check blood sugar 2 times daily as needed [name], Ibtehal Jaralla, MD  Active Self, Pharmacy Records  insulin  aspart (NOVOLOG ) 100 UNIT/ML injection [id number] Yes Inject 10 Units into the skin with breakfast, with lunch, and with evening meal. Wouk, Devaughn Sayres, MD  Active   Insulin  Glargine (BASAGLAR  [facility]) 100 UNIT/ML [id number] Yes Inject 70 Units into the skin daily. [name], Donell Cardinal, MD  Active Self, Pharmacy Records  Insulin  Pen Needle 32G X 4 MM MISC [id number] Yes 1 Device by Does not apply route in the morning, at noon, in the evening, and at bedtime. [name], Donell Cardinal, MD  Active Self, Pharmacy Records  ipratropium (ATROVENT ) 0.03 % nasal spray [id number] Yes Place 2 sprays into both nostrils 2 (two) times daily. [name], Krichna, MD  Active Self, Pharmacy Records  JARDIANCE  25 MG TABS tablet [id number] Yes Take 1 tablet (25 mg total) by mouth daily. [name], Donell Cardinal, MD  Active Self, Pharmacy Records  Lancets [facility] MULTICLIX) lancets [id number] Yes Check sugar 2 times daily as needed. [name], Donell Cardinal, MD  Active Self, Pharmacy Records  levocetirizine (XYZAL ) 5 MG tablet [id number] Yes Take 1 tablet (5 mg total) by mouth in the morning. [name], Krichna, MD  Active Self, Pharmacy Records  linaclotide  (LINZESS ) 290 MCG CAPS capsule [id number] Yes Take 1 capsule (290 mcg total) by mouth daily before breakfast. Therisa Bi, MD  Active Self, Pharmacy Records  nitroGLYCERIN  (NITROSTAT ) 0.4 MG SL tablet [id number] Yes PLACE 1 TABLET UNDER THE TONGUE EVERY 5 MINUTES AS NEEDED FOR CHEST PAIN. Darliss Rogue, MD  Active Self, Pharmacy Records  OZEMPIC , 2 MG/DOSE, 8 MG/3ML SOPN [id number] Yes Inject 2 mg into the skin once a week. [provider]  Active Self, Pharmacy Records  Rimegepant Sulfate  (NURTEC) 75 MG TBDP [id number] Yes Take 1  tablet (75 mg total) by mouth daily as needed. Max of every other day prn [name], Krichna, MD  Active Self, Pharmacy Records  spironolactone  (ALDACTONE ) 25 MG tablet [id number] Yes Take 1 tablet (25 mg total) by mouth daily. Darliss Rogue, MD  Active Self, Pharmacy Records  traMADol  (ULTRAM ) 50 MG tablet [id number]  Take 1 tablet (50 mg total) by mouth every 6 (six) hours as needed for severe pain (pain score 7-10).  Patient not taking: Reported on 08/25/2024   Kandis Devaughn Sayres, MD  Active             Recommendation:   PCP Follow-up Continue Current Plan of Care BSW 08/31/24 at 11:00 am LCSW 09/13/2024 at 10:00 am  Follow Up Plan:   Telephone follow-up 2 Abiel [place]  [name], MSN, RN [facility], [facility] RN Care Manager Direct Dial : [phone] Fax: [phone]

## 2024-08-28 ENCOUNTER — Encounter: Payer: Self-pay | Admitting: Family Medicine

## 2024-08-28 ENCOUNTER — Other Ambulatory Visit (HOSPITAL_COMMUNITY): Payer: Self-pay

## 2024-08-28 ENCOUNTER — Ambulatory Visit: Admitting: Family Medicine

## 2024-08-28 VITALS — BP 106/62 | HR 82 | Resp 16 | Ht 69.0 in | Wt 194.5 lb

## 2024-08-28 DIAGNOSIS — R52 Pain, unspecified: Secondary | ICD-10-CM

## 2024-08-28 DIAGNOSIS — Z09 Encounter for follow-up examination after completed treatment for conditions other than malignant neoplasm: Secondary | ICD-10-CM

## 2024-08-28 DIAGNOSIS — M48061 Spinal stenosis, lumbar region without neurogenic claudication: Secondary | ICD-10-CM | POA: Diagnosis not present

## 2024-08-28 DIAGNOSIS — J111 Influenza due to unidentified influenza virus with other respiratory manifestations: Secondary | ICD-10-CM

## 2024-08-28 DIAGNOSIS — E118 Type 2 diabetes mellitus with unspecified complications: Secondary | ICD-10-CM

## 2024-08-28 LAB — POC COVID19/FLU A&B COMBO
Covid Antigen, POC: NEGATIVE
Influenza A Antigen, POC: POSITIVE — AB
Influenza B Antigen, POC: POSITIVE — AB

## 2024-08-28 MED ORDER — BENZONATATE 100 MG PO CAPS: 100.0000 mg | ORAL_CAPSULE | Freq: Three times a day (TID) | ORAL | 0 refills | Status: DC | PRN

## 2024-08-28 MED ORDER — OSELTAMIVIR PHOSPHATE 75 MG PO CAPS: 75.0000 mg | ORAL_CAPSULE | Freq: Two times a day (BID) | ORAL | 0 refills | Status: DC

## 2024-08-28 NOTE — Progress Notes (Signed)
 Name: Sarah Duffy   MRN: [id number]    DOB: 13-Oct-1971   Date:08/28/2024       Progress Note  Subjective  Chief Complaint  Chief Complaint  Patient presents with   Hospitalization Follow-up    Req med equipment    Discussed the use of AI scribe software for clinical note transcription with the patient, who gave verbal consent to proceed.  History of Present Illness Sarah Duffy is a 53 year old female with lumbar spinal stenosis and diabetes who presents with flu symptoms and recent COVID-19 infection.  She has been feeling unwell since yesterday, experiencing worsening body aches, chills, cough, and nasal congestion. She tested positive for both influenza A and B, and recently had COVID-19 while at [employer]. She was treated with antiviral medication for COVID-19 but is uncertain if she completed the course as she still feels symptomatic.  In August, she was hospitalized for severe lower back pain due to lumbar spinal stenosis. The pain was described as intense, causing her back to 'stay locked' and preventing her from standing up. During her hospital stay, she received physical and occupational therapy, as well as pain management with oxycodone  and tramadol . She was already on gabapentin  800 mg three times a day prior to hospitalization. Post-discharge, she was referred to [facility] for rehabilitation, but reports limited therapy sessions and persistent swelling in her feet and ankles, attributed to congestive arthritis.  She has a history of diabetes with an A1c that increased from 9 to 10.4, making her ineligible for surgery. She also has chronic cardiomyopathy and requires bypass surgery, but has not been cleared due to her uncontrolled blood sugar levels. She lives alone and finds it challenging to manage her health needs independently.  Currently, she is unable to drive and requires assistance for mobility. She uses a walker as recommended by her physical therapist due to  instability.  No shooting pain down her legs, with pain remaining localized in her lower back. She confirms having all her medications at home.    Patient Active Problem List   Diagnosis Date Noted   Chronic combined systolic and diastolic CHF (congestive heart failure) (HCC) 07/20/2024   Fall at home, initial encounter 07/20/2024   Type II diabetes mellitus with renal manifestations (HCC) 07/20/2024   Overweight (BMI 25.0-29.9) 07/20/2024   Cervical myelopathy (HCC) 07/20/2024   Impaired gait and mobility 07/20/2024   Lumbar spinal stenosis 07/19/2024   Chronic pain syndrome 07/05/2024   Dyslipidemia due to type 2 diabetes mellitus (HCC) 07/05/2024   Spondylosis, cervical, with myelopathy 04/18/2024   Hip weakness 04/03/2024   Gait instability 04/03/2024   Chronic anemia 03/14/2024   Gait abnormality 11/30/2023   Spinal stenosis of lumbar region 11/30/2023   Chronic kidney disease, stage 3a (HCC) 11/09/2023   Weakness of extremity 09/20/2023   Arthropathy of lumbar facet joint 07/29/2023   Low back pain 07/29/2023   Pain in finger of left hand 07/29/2023   Sacroiliac joint pain 07/29/2023   Contact dermatitis and eczema 06/08/2023   Dysphagia 03/23/2023   Asthma, well controlled 03/09/2023   Depression, major, in remission (HCC) 03/09/2023   Perennial allergic rhinitis with seasonal variation 03/09/2023   Type 2 diabetes mellitus with diabetic polyneuropathy, with long-term current use of insulin  (HCC) 08/31/2022   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(HCC) 08/31/2022   Hypertension 06/11/2022   Ischemic cardiomyopathy 06/11/2022   Coronary artery disease 06/11/2022   Chronic systolic heart failure (HCC) 06/11/2022   Angina pectoris associated  with type 2 diabetes mellitus (HCC) 01/05/2022   Cardiomyopathy (HCC) 04/14/2021   History of 2019 novel coronavirus disease (COVID-19) 06/23/2019   Chronic migraine w/o aura, not intractable, w/o stat migr 01/14/2018   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 associated with type 2 diabetes mellitus (HCC) 05/21/2016   Hyperlipidemia LDL goal <70 03/10/2016   MI (mitral incompetence) 02/25/2016   Insomnia 02/25/2016   Anxiety 09/04/2014    Past Surgical History:  Procedure Laterality Date   CHOLECYSTECTOMY  1999   COLONOSCOPY WITH PROPOFOL  N/A 03/25/2021   Procedure: COLONOSCOPY WITH PROPOFOL ;  Surgeon: Therisa Bi, MD;  Location: [facility] ENDOSCOPY;  Service: Gastroenterology;  Laterality: N/A;   COLONOSCOPY WITH PROPOFOL  N/A 04/17/2021   Procedure: COLONOSCOPY WITH PROPOFOL ;  Surgeon: Therisa Bi, MD;  Location: [facility] ENDOSCOPY;  Service: Gastroenterology;  Laterality: N/A;   ESOPHAGOGASTRODUODENOSCOPY (EGD) WITH PROPOFOL  N/A 03/25/2021   Procedure: ESOPHAGOGASTRODUODENOSCOPY (EGD) WITH PROPOFOL ;  Surgeon: Therisa Bi, MD;  Location: [facility] ENDOSCOPY;  Service: Gastroenterology;  Laterality: N/A;   ESOPHAGOGASTRODUODENOSCOPY (EGD) WITH PROPOFOL  N/A 04/17/2021   Procedure: ESOPHAGOGASTRODUODENOSCOPY (EGD) WITH PROPOFOL ;  Surgeon: Therisa Bi, MD;  Location: [facility] ENDOSCOPY;  Service: Gastroenterology;  Laterality: N/A;   ESOPHAGOGASTRODUODENOSCOPY (EGD) WITH PROPOFOL  N/A 03/23/2023   Procedure: ESOPHAGOGASTRODUODENOSCOPY (EGD) WITH PROPOFOL ;  Surgeon: Therisa Bi, MD;  Location: [facility] ENDOSCOPY;  Service: Gastroenterology;  Laterality: N/A;   RIGHT/LEFT HEART CATH AND CORONARY ANGIOGRAPHY Bilateral 04/14/2021   Procedure: RIGHT/LEFT HEART CATH AND CORONARY ANGIOGRAPHY;  Surgeon: Mady Bruckner, MD;  Location: [facility] INVASIVE CV LAB;  Service: Cardiovascular;  Laterality: Bilateral;   TOE SURGERY Left 02/07/2016   Pinky Toe    Family History  Problem Relation Age of Onset   Diabetes Mother    Ulcers Mother    Heart disease Father    AAA (abdominal aortic aneurysm) Father    Diabetes Father    Hypertension Father    Stroke Father    Alzheimer's disease Father    Heart attack Sister     Seizures Brother    Diabetes Maternal Grandmother    Breast cancer Maternal Grandmother     Social History   Tobacco Use   Smoking status: Never   Smokeless tobacco: Never  Substance Use Topics   Alcohol use: Never     Current Outpatient Medications:    albuterol  (VENTOLIN  HFA) 108 (90 Base) MCG/ACT inhaler, INHALE 2 PUFFS BY MOUTH EVERY 4 HOURS AS NEEDED FOR WHEEZE OR FOR SHORTNESS OF BREATH, Disp: 18 each, Rfl: 1   amLODipine  (NORVASC ) 5 MG tablet, Take 1 tablet (5 mg total) by mouth daily., Disp: 30 tablet, Rfl: 5   aspirin  EC 81 MG tablet, Take by mouth., Disp: , Rfl:    atorvastatin  (LIPITOR) 80 MG tablet, Take 1 tablet (80 mg total) by mouth daily., Disp: 30 tablet, Rfl: 5   Blood Glucose Monitoring Suppl (ACCU-CHEK GUIDE) w/Device KIT, Use to check blood sugar 2 time daily as needed, Disp: 1 kit, Rfl: 0   carvedilol  (COREG ) 12.5 MG tablet, TAKE 1 TABLET BY MOUTH 2 TIMES DAILY., Disp: 180 tablet, Rfl: 2   Continuous Glucose Sensor (DEXCOM G7 SENSOR) MISC, 1 DEVICE BY DOES NOT APPLY ROUTE AS DIRECTED., Disp: 3 each, Rfl: 11   ENTRESTO  24-26 MG, Take 1 tablet by mouth 2 (two) times daily., Disp: 180 tablet, Rfl: 3   ezetimibe  (ZETIA ) 10 MG tablet, Take 1 tablet (10 mg total) by mouth daily., Disp: 30 tablet, Rfl: 5   gabapentin  (NEURONTIN )  800 MG tablet, Take 800 mg by mouth 3 (three) times daily., Disp: , Rfl:    Galcanezumab -gnlm (EMGALITY ) 120 MG/ML SOSY, Inject 1 mL into the skin every 30 (thirty) days., Disp: 1.12 mL, Rfl: 5   glucose blood (ACCU-CHEK GUIDE) test strip, Check blood sugar 2 times daily as needed, Disp: 100 each, Rfl: 12   insulin  aspart (NOVOLOG ) 100 UNIT/ML injection, Inject 10 Units into the skin with breakfast, with lunch, and with evening meal., Disp: 10 mL, Rfl: 11   Insulin  Glargine (BASAGLAR  KWIKPEN) 100 UNIT/ML, Inject 70 Units into the skin daily., Disp: 60 mL, Rfl: 3   Insulin  Pen Needle 32G X 4 MM MISC, 1 Device by Does not apply route in the morning,  at noon, in the evening, and at bedtime., Disp: 400 each, Rfl: 3   ipratropium (ATROVENT ) 0.03 % nasal spray, Place 2 sprays into both nostrils 2 (two) times daily., Disp: 30 mL, Rfl: 2   JARDIANCE  25 MG TABS tablet, Take 1 tablet (25 mg total) by mouth daily., Disp: 90 tablet, Rfl: 2   Lancets (ACCU-CHEK MULTICLIX) lancets, Check sugar 2 times daily as needed., Disp: 100 each, Rfl: 12   levocetirizine (XYZAL ) 5 MG tablet, Take 1 tablet (5 mg total) by mouth in the morning., Disp: 34 tablet, Rfl: 5   linaclotide  (LINZESS ) 290 MCG CAPS capsule, Take 1 capsule (290 mcg total) by mouth daily before breakfast., Disp: 30 capsule, Rfl: 11   nitroGLYCERIN  (NITROSTAT ) 0.4 MG SL tablet, PLACE 1 TABLET UNDER THE TONGUE EVERY 5 MINUTES AS NEEDED FOR CHEST PAIN., Disp: 25 tablet, Rfl: 0   OZEMPIC , 2 MG/DOSE, 8 MG/3ML SOPN, Inject 2 mg into the skin once a week., Disp: , Rfl:    Rimegepant Sulfate  (NURTEC) 75 MG TBDP, Take 1 tablet (75 mg total) by mouth daily as needed. Max of every other day prn, Disp: 16 tablet, Rfl: 0   spironolactone  (ALDACTONE ) 25 MG tablet, Take 1 tablet (25 mg total) by mouth daily., Disp: 90 tablet, Rfl: 3   traMADol  (ULTRAM ) 50 MG tablet, Take 1 tablet (50 mg total) by mouth every 6 (six) hours as needed for severe pain (pain score 7-10)., Disp: 30 tablet, Rfl: 0  No Known Allergies  I personally reviewed active problem list, medication list, allergies, family history with the patient/caregiver today.   ROS  Ten systems reviewed and is negative except as mentioned in HPI    Objective Physical Exam CONSTITUTIONAL: Patient appears well-developed and well-nourished.  No distress. HEENT: Head atraumatic, normocephalic, neck supple. CARDIOVASCULAR: Normal rate, regular rhythm and normal heart sounds.  No murmur heard. No BLE edema. PULMONARY: Effort normal and breath sounds normal. No respiratory distress. MUSCULOSKELETAL: slow gait PSYCHIATRIC: Patient has a normal mood and  affect. behavior is normal. Judgment and thought content normal.  Vitals:   08/28/24 1411  BP: 106/62  Pulse: 82  Resp: 16  SpO2: 98%  Weight: 194 lb 8 oz (88.2 kg)  Height: 5' 9 (1.753 m)    Body mass index is 28.72 kg/m.  Recent Results (from the past 2160 hours)  Brain natriuretic peptide     Status: None   Collection Time: 07/20/24 12:40 AM  Result Value Ref Range   B Natriuretic Peptide 84.7 0.0 - 100.0 pg/mL    Comment: Performed at [facility], [address], [place], [place] [address]  CBC     Status: Abnormal   Collection Time: 07/20/24 12:40 AM  Result Value Ref Range  WBC 6.2 4.0 - 10.5 K/uL   RBC 3.50 (L) 3.87 - 5.11 MIL/uL   Hemoglobin 10.1 (L) 12.0 - 15.0 g/dL   HCT 69.4 (L) 63.9 - 53.9 %   MCV 87.1 80.0 - 100.0 fL   MCH 28.9 26.0 - 34.0 pg   MCHC 33.1 30.0 - 36.0 g/dL   RDW 87.2 88.4 - 84.4 %   Platelets 200 150 - 400 K/uL   nRBC 0.0 0.0 - 0.2 %    Comment: Performed at [facility], [address] [place], [place] [address]  Basic metabolic panel with GFR     Status: Abnormal   Collection Time: 07/20/24 12:40 AM  Result Value Ref Range   Sodium 139 135 - 145 mmol/L   Potassium 5.0 3.5 - 5.1 mmol/L   Chloride 107 98 - 111 mmol/L   CO2 23 22 - 32 mmol/L   Glucose, Bld 286 (H) 70 - 99 mg/dL    Comment: Glucose reference range applies only to samples taken after fasting for at least 8 hours.   BUN 34 (H) 6 - 20 mg/dL   Creatinine, Ser 8.71 (H) 0.44 - 1.00 mg/dL   Calcium  9.1 8.9 - 10.3 mg/dL   GFR, Estimated 50 (L) >60 mL/min    Comment: (NOTE) Calculated using the CKD-EPI Creatinine Equation (2021)    Anion gap 9 5 - 15    Comment: Performed at [facility], [address] [place], [place] [address]  HIV Antibody (routine testing w rflx)     Status: None   Collection Time: 07/20/24 12:40 AM  Result Value Ref Range   HIV Screen 4th Generation wRfx Non Reactive Non Reactive    Comment: Performed at [facility], [address] [place], [place] [address]  Glucose, capillary     Status: Abnormal   Collection Time: 07/20/24  1:04 AM  Result Value Ref Range   Glucose-Capillary 274 (H) 70 - 99 mg/dL    Comment: Glucose reference range applies only to samples taken after fasting for at least 8 hours.  Glucose, capillary     Status: Abnormal   Collection Time: 07/20/24  7:19 AM  Result Value Ref Range   Glucose-Capillary 277 (H) 70 - 99 mg/dL    Comment: Glucose reference range applies only to samples taken after fasting for at least 8 hours.  Glucose, capillary     Status: Abnormal   Collection Time: 07/20/24 11:12 AM  Result Value Ref Range   Glucose-Capillary 358 (H) 70 - 99 mg/dL    Comment: Glucose reference range applies only to samples taken after fasting for at least 8 hours.  Glucose, capillary     Status: Abnormal   Collection Time: 07/20/24  4:13 PM  Result Value Ref Range   Glucose-Capillary 269 (H) 70 - 99 mg/dL    Comment: Glucose reference range applies only to samples taken after fasting for at least 8 hours.  Glucose, capillary     Status: Abnormal   Collection Time: 07/20/24  9:27 PM  Result Value Ref Range   Glucose-Capillary 268 (H) 70 - 99 mg/dL    Comment: Glucose reference range applies only to samples taken after fasting for at least 8 hours.  Hemoglobin A1c     Status: Abnormal   Collection Time: 07/21/24  2:10 AM  Result Value Ref Range   Hgb A1c MFr Bld 10.4 (H) 4.8 - 5.6 %    Comment: (NOTE)  Prediabetes: 5.7 - 6.4         Diabetes: >6.4         Glycemic control for adults with diabetes: <7.0    Mean Plasma Glucose 252 mg/dL    Comment: (NOTE) Performed At: [facility] [address] [place], [place] [id number] Jennette Shorter MD Ey:[id number]   Basic metabolic panel     Status: Abnormal   Collection Time: 07/21/24  2:11 AM  Result Value Ref Range   Sodium 138 135 - 145 mmol/L   Potassium 4.6 3.5 - 5.1 mmol/L   Chloride 105 98 - 111  mmol/L   CO2 25 22 - 32 mmol/L   Glucose, Bld 160 (H) 70 - 99 mg/dL    Comment: Glucose reference range applies only to samples taken after fasting for at least 8 hours.   BUN 44 (H) 6 - 20 mg/dL   Creatinine, Ser 8.80 (H) 0.44 - 1.00 mg/dL   Calcium  9.5 8.9 - 10.3 mg/dL   GFR, Estimated 54 (L) >60 mL/min    Comment: (NOTE) Calculated using the CKD-EPI Creatinine Equation (2021)    Anion gap 8 5 - 15    Comment: Performed at [facility], [address] [place], [place] [address]  Glucose, capillary     Status: Abnormal   Collection Time: 07/21/24  8:11 AM  Result Value Ref Range   Glucose-Capillary 112 (H) 70 - 99 mg/dL    Comment: Glucose reference range applies only to samples taken after fasting for at least 8 hours.  Glucose, capillary     Status: None   Collection Time: 07/21/24 11:30 AM  Result Value Ref Range   Glucose-Capillary 95 70 - 99 mg/dL    Comment: Glucose reference range applies only to samples taken after fasting for at least 8 hours.  Glucose, capillary     Status: Abnormal   Collection Time: 07/21/24  4:46 PM  Result Value Ref Range   Glucose-Capillary 138 (H) 70 - 99 mg/dL    Comment: Glucose reference range applies only to samples taken after fasting for at least 8 hours.  Glucose, capillary     Status: Abnormal   Collection Time: 07/21/24  9:29 PM  Result Value Ref Range   Glucose-Capillary 220 (H) 70 - 99 mg/dL    Comment: Glucose reference range applies only to samples taken after fasting for at least 8 hours.  Glucose, capillary     Status: Abnormal   Collection Time: 07/22/24  7:58 AM  Result Value Ref Range   Glucose-Capillary 142 (H) 70 - 99 mg/dL    Comment: Glucose reference range applies only to samples taken after fasting for at least 8 hours.  Glucose, capillary     Status: None   Collection Time: 07/22/24 11:26 AM  Result Value Ref Range   Glucose-Capillary 91 70 - 99 mg/dL    Comment: Glucose reference range applies only to  samples taken after fasting for at least 8 hours.  Glucose, capillary     Status: Abnormal   Collection Time: 07/22/24  4:07 PM  Result Value Ref Range   Glucose-Capillary 114 (H) 70 - 99 mg/dL    Comment: Glucose reference range applies only to samples taken after fasting for at least 8 hours.  Glucose, capillary     Status: Abnormal   Collection Time: 07/22/24  9:07 PM  Result Value Ref Range   Glucose-Capillary 249 (H) 70 - 99 mg/dL    Comment: Glucose reference range  applies only to samples taken after fasting for at least 8 hours.   Comment 1 Notify RN   Glucose, capillary     Status: None   Collection Time: 07/23/24  8:10 AM  Result Value Ref Range   Glucose-Capillary 76 70 - 99 mg/dL    Comment: Glucose reference range applies only to samples taken after fasting for at least 8 hours.  Glucose, capillary     Status: Abnormal   Collection Time: 07/23/24 11:40 AM  Result Value Ref Range   Glucose-Capillary 221 (H) 70 - 99 mg/dL    Comment: Glucose reference range applies only to samples taken after fasting for at least 8 hours.  Glucose, capillary     Status: Abnormal   Collection Time: 07/23/24  4:22 PM  Result Value Ref Range   Glucose-Capillary 144 (H) 70 - 99 mg/dL    Comment: Glucose reference range applies only to samples taken after fasting for at least 8 hours.  Glucose, capillary     Status: Abnormal   Collection Time: 07/23/24  9:37 PM  Result Value Ref Range   Glucose-Capillary 181 (H) 70 - 99 mg/dL    Comment: Glucose reference range applies only to samples taken after fasting for at least 8 hours.   Comment 1 Notify RN   Glucose, capillary     Status: Abnormal   Collection Time: 07/24/24  7:12 AM  Result Value Ref Range   Glucose-Capillary 230 (H) 70 - 99 mg/dL    Comment: Glucose reference range applies only to samples taken after fasting for at least 8 hours.  Glucose, capillary     Status: None   Collection Time: 07/24/24 12:03 PM  Result Value Ref Range    Glucose-Capillary 71 70 - 99 mg/dL    Comment: Glucose reference range applies only to samples taken after fasting for at least 8 hours.  Glucose, capillary     Status: Abnormal   Collection Time: 07/24/24  5:11 PM  Result Value Ref Range   Glucose-Capillary 192 (H) 70 - 99 mg/dL    Comment: Glucose reference range applies only to samples taken after fasting for at least 8 hours.  Glucose, capillary     Status: Abnormal   Collection Time: 07/24/24  9:08 PM  Result Value Ref Range   Glucose-Capillary 193 (H) 70 - 99 mg/dL    Comment: Glucose reference range applies only to samples taken after fasting for at least 8 hours.   Comment 1 Notify RN   Basic metabolic panel     Status: Abnormal   Collection Time: 07/25/24  4:16 AM  Result Value Ref Range   Sodium 136 135 - 145 mmol/L   Potassium 4.8 3.5 - 5.1 mmol/L   Chloride 108 98 - 111 mmol/L   CO2 24 22 - 32 mmol/L   Glucose, Bld 94 70 - 99 mg/dL    Comment: Glucose reference range applies only to samples taken after fasting for at least 8 hours.   BUN 50 (H) 6 - 20 mg/dL   Creatinine, Ser 8.86 (H) 0.44 - 1.00 mg/dL   Calcium  9.3 8.9 - 10.3 mg/dL   GFR, Estimated 58 (L) >60 mL/min    Comment: (NOTE) Calculated using the CKD-EPI Creatinine Equation (2021)    Anion gap 4 (L) 5 - 15    Comment: Performed at [facility], [address] [place], [place] [address]  CBC     Status: Abnormal   Collection Time: 07/25/24  4:16 AM  Result Value Ref Range   WBC 5.9 4.0 - 10.5 K/uL   RBC 3.36 (L) 3.87 - 5.11 MIL/uL   Hemoglobin 9.7 (L) 12.0 - 15.0 g/dL   HCT 70.3 (L) 63.9 - 53.9 %   MCV 88.1 80.0 - 100.0 fL   MCH 28.9 26.0 - 34.0 pg   MCHC 32.8 30.0 - 36.0 g/dL   RDW 86.9 88.4 - 84.4 %   Platelets 212 150 - 400 K/uL   nRBC 0.0 0.0 - 0.2 %    Comment: Performed at [facility], [address] [place], [place] [address]  Glucose, capillary     Status: None   Collection Time: 07/25/24  7:25 AM  Result Value Ref  Range   Glucose-Capillary 95 70 - 99 mg/dL    Comment: Glucose reference range applies only to samples taken after fasting for at least 8 hours.  Glucose, capillary     Status: Abnormal   Collection Time: 07/25/24 11:40 AM  Result Value Ref Range   Glucose-Capillary 238 (H) 70 - 99 mg/dL    Comment: Glucose reference range applies only to samples taken after fasting for at least 8 hours.  Glucose, capillary     Status: Abnormal   Collection Time: 07/25/24  4:59 PM  Result Value Ref Range   Glucose-Capillary 139 (H) 70 - 99 mg/dL    Comment: Glucose reference range applies only to samples taken after fasting for at least 8 hours.  Glucose, capillary     Status: Abnormal   Collection Time: 07/25/24  7:55 PM  Result Value Ref Range   Glucose-Capillary 102 (H) 70 - 99 mg/dL    Comment: Glucose reference range applies only to samples taken after fasting for at least 8 hours.  Chlamydia/NGC rt PCR ([facility] only)     Status: None   Collection Time: 07/26/24 12:17 AM   Specimen: Urine  Result Value Ref Range   Specimen source GC/Chlam URINE, RANDOM    Chlamydia Tr NOT DETECTED NOT DETECTED   N gonorrhoeae NOT DETECTED NOT DETECTED    Comment: (NOTE) This CT/NG assay has not been evaluated in patients with a history of  hysterectomy. Performed at [facility], [address] [place], [place] [address]   Glucose, capillary     Status: Abnormal   Collection Time: 07/26/24  7:32 AM  Result Value Ref Range   Glucose-Capillary 173 (H) 70 - 99 mg/dL    Comment: Glucose reference range applies only to samples taken after fasting for at least 8 hours.  Glucose, capillary     Status: None   Collection Time: 07/26/24 11:22 AM  Result Value Ref Range   Glucose-Capillary 87 70 - 99 mg/dL    Comment: Glucose reference range applies only to samples taken after fasting for at least 8 hours.  Glucose, capillary     Status: Abnormal   Collection Time: 07/26/24  4:50 PM  Result Value Ref  Range   Glucose-Capillary 290 (H) 70 - 99 mg/dL    Comment: Glucose reference range applies only to samples taken after fasting for at least 8 hours.  Glucose, capillary     Status: Abnormal   Collection Time: 07/26/24  9:29 PM  Result Value Ref Range   Glucose-Capillary 156 (H) 70 - 99 mg/dL    Comment: Glucose reference range applies only to samples taken after fasting for at least 8 hours.   Comment 1 Notify RN   Glucose, capillary  Status: Abnormal   Collection Time: 07/27/24  7:51 AM  Result Value Ref Range   Glucose-Capillary 241 (H) 70 - 99 mg/dL    Comment: Glucose reference range applies only to samples taken after fasting for at least 8 hours.  Glucose, capillary     Status: None   Collection Time: 07/27/24 11:42 AM  Result Value Ref Range   Glucose-Capillary 71 70 - 99 mg/dL    Comment: Glucose reference range applies only to samples taken after fasting for at least 8 hours.    Diabetic Foot Exam:     PHQ2/9:    08/28/2024    2:07 PM 08/25/2024    1:58 PM 07/05/2024   10:56 AM 04/10/2024   10:43 AM 03/09/2024   10:42 AM  Depression screen PHQ 2/9  Decreased Interest 2 2 0 0 1  Down, Depressed, Hopeless 1 1 0 0 1  PHQ - 2 Score 3 3 0 0 2  Altered sleeping 2 2  0 1  Tired, decreased energy 3 3  0 1  Change in appetite 0 0  0 0  Feeling bad or failure about yourself  0   0 1  Trouble concentrating 1 1  0 1  Moving slowly or fidgety/restless 0 0  0 0  Suicidal thoughts 0 0  0 0  PHQ-9 Score 9 9  0 6  Difficult doing work/chores Somewhat difficult Somewhat difficult  Not difficult at all Very difficult    phq 9 is positive  Fall Risk:    08/28/2024    2:07 PM 08/25/2024    1:55 PM 07/05/2024   10:55 AM 12/27/2023    3:30 PM 11/09/2023    7:42 AM  Fall Risk   Falls in the past year? 0 0 0 0 0  Number falls in past yr: 0 0 0 0 0  Injury with Fall? 0 0 0 0 0  Risk for fall due to : No Fall Risks Impaired balance/gait;Impaired mobility;Orthopedic patient No  Fall Risks No Fall Risks No Fall Risks  Follow up Falls evaluation completed Falls evaluation completed;Falls prevention discussed Falls evaluation completed Falls prevention discussed;Education provided;Falls evaluation completed Falls prevention discussed     Assessment and Plan Assessment & Plan Influenza A and B infection Acute influenza A and B infection confirmed. Symptoms worsened. - Prescribed Tamiflu . - Advised fluids and rest for symptomatic control.  Congestive heart failure with cardiomyopathy Chronic heart failure with cardiomyopathy. Bypass surgery deferred due to poor glycemic control.  Type 2 diabetes mellitus with poor glycemic control with other complications  Type 2 diabetes with A1c of 10.4. Endocrinologist involved. Emphasized diet and medication adherence. - Continue current diabetes medications. - Emphasize diet and medication adherence. - Arrange nursing support for diabetes management if eligible.  Lumbar spinal stenosis with chronic low back pain Chronic lumbar spinal stenosis with persistent low back pain. Pain managed with gabapentin , tramadol , and oxycodone . - Continue gabapentin , tramadol , and oxycodone  as needed. - Arrange home physical therapy if eligible through Medicaid. - Provide assistive devices: raised commode seat, shower chair, walker.

## 2024-08-30 ENCOUNTER — Telehealth: Payer: Self-pay | Admitting: Family Medicine

## 2024-08-30 DIAGNOSIS — M48061 Spinal stenosis, lumbar region without neurogenic claudication: Secondary | ICD-10-CM | POA: Diagnosis not present

## 2024-08-30 NOTE — Telephone Encounter (Unsigned)
 Copied from CRM [id number]. Topic: Referral - Question >> Aug 30, 2024 12:55 PM Winona SAUNDERS wrote: Stevphen from [facility] is calling to request home health aide be removed from the referral sent over for the. Call back number Lolita [phone]

## 2024-08-31 ENCOUNTER — Telehealth: Payer: Self-pay | Admitting: Family Medicine

## 2024-08-31 ENCOUNTER — Telehealth: Payer: Self-pay

## 2024-08-31 NOTE — Telephone Encounter (Unsigned)
 Copied from CRM [id number]. Topic: Clinical - Home Health Verbal Orders >> Aug 31, 2024  2:03 PM Zebedee SAUNDERS wrote: Caller/Agency: [facility] per [place] Rushing Number: [phone] secure line  Service Requested: Physical Therapy Frequency: Evaluation  Any new concerns about the patient? No

## 2024-08-31 NOTE — Telephone Encounter (Signed)
 Called Stevphen unable to get in touch and unable to leave vm

## 2024-09-01 ENCOUNTER — Ambulatory Visit: Admitting: Neurosurgery

## 2024-09-01 NOTE — Telephone Encounter (Signed)
No answer left detailed vm

## 2024-09-04 ENCOUNTER — Telehealth: Payer: Self-pay

## 2024-09-04 ENCOUNTER — Ambulatory Visit: Admitting: Dietician

## 2024-09-04 NOTE — Telephone Encounter (Unsigned)
 Copied from CRM #[id number]. Topic: Clinical - Home Health Verbal Orders >> Sep 04, 2024 11:46 AM Delon HERO wrote: Darryle PT with [facility] calling for PT verbal order Frequency with a new start of care date, tomorrow, because have been unable to to reach the patient CB- [phone]

## 2024-09-05 ENCOUNTER — Telehealth: Payer: Self-pay

## 2024-09-05 NOTE — Telephone Encounter (Signed)
 Copied from CRM #[id number]. Topic: General - Other >> Sep 05, 2024 11:11 AM Mia F wrote: Reason for CRM: Darryle from [facility] (PT) states he has not been able to get in contact with pt and she is not returning any of their calls. They are going to mark pt as a non-admit

## 2024-09-08 ENCOUNTER — Other Ambulatory Visit: Payer: Self-pay

## 2024-09-08 NOTE — Patient Instructions (Signed)
 Visit Information  Sarah Duffy was given information about Medicaid Managed Care team care coordination services as a part of their [facility] Medicaid benefit.   If you would like to schedule transportation through your [facility], please call the following number at least 2 days in advance of your appointment: [phone]   Rides for urgent appointments can also be made after hours by calling Member Services.  Call the Behavioral Health Crisis Line at [phone], at any time, 24 hours a day, 7 days a week. If you are in danger or need immediate medical attention call 911.  Please see education materials related to Carb Counting provided by MyChart link.  Patient verbalizes understanding of instructions and care plan provided today and agrees to view in MyChart. Active MyChart status and patient understanding of how to access instructions and care plan via MyChart confirmed with patient.     Telephone follow up appointment with Managed Medicaid care management team member scheduled for: 10/03/24 at 3:00 pm  Sarah Duos, MSN, RN [place]  [facility] RN Care Manager Direct Dial : [phone] Fax: [phone]   Following is a copy of your plan of care:  There are no care plans that you recently modified to display for this patient.    Diabetes: Carbohydrate Counting for Adults Carbohydrate counting is a method of keeping track of how many carbohydrates you eat. Eating carbohydrates increases the amount of sugar, also called glucose, in your blood. By counting how many carbohydrates you eat, you can improve how well you manage your blood sugar. This, in turn, helps you manage your diabetes. Carbohydrates are measured in grams (g) per serving. It's important to know how many carbohydrates (in grams or by serving size) you can have in each meal. This is different for every person. A dietitian can help you  make a meal plan and calculate how many carbohydrates you should have at each meal and snack. What foods contain carbohydrates? Carbohydrates are found in these foods: Grains, such as breads and cereals. Dried beans and soy products. Starchy vegetables, such as potatoes, peas, and corn. Fruit and fruit juices. Milk and yogurt. Sweets and snack foods like cake, cookies, candy, chips, and soft drinks. How do I count carbohydrates in foods? There are two ways to count carbohydrates in food. You can read food labels or learn standard serving sizes of foods. You can use either of these methods or a combination of both. Using the Nutrition Facts label The Nutrition Facts list is included on the labels of almost all packaged foods and drinks in the [place] It includes: The serving size. Information about nutrients in each serving. This includes the grams of carbohydrate per serving. To use the Nutrition Facts, decide how many servings you will have. Then, multiply the number of servings by the number of carbohydrates per serving. The resulting number is the total grams of carbohydrates that you'll be having. Learning the standard serving sizes of foods When you eat carbohydrate foods that aren't packaged or don't include Nutrition Facts on the label, you need to measure the servings in order to count the grams of carbohydrates. Measure the foods that you'll eat with a food scale or measuring cup, if needed. Decide how many standard-size servings you'll eat. Multiply the number of servings by 15. For foods that contain carbohydrates, one serving equals 15 g of carbohydrates. For example, if you eat 2 cups or 10 oz (300 g) of strawberries,  you'll have eaten 2 servings and 30 g of carbohydrates (2 servings x 15 g = 30 g). For foods that have more than one food mixed, such as soups and casseroles, you must count the carbohydrates in each food that's included. Here's a list of standard serving sizes for  common carbohydrate-rich foods. Each of these servings has about 15 g of carbohydrates: 1 slice of bread. 1 six-inch (15 cm) tortilla. ? cup or 2 oz (53 g) of cooked rice or pasta.  cup or 3 oz (85 g) of cooked or canned, drained, and rinsed beans or lentils.  cup or 3 oz (85 g) of a starchy vegetable, such as peas, corn, or squash.  cup or 4 oz (120 g) of hot cereal.  cup or 3 oz (85 g) of boiled or mashed potatoes, or  or 3 oz (85 g) of a large baked potato.  cup or 4 fl oz (118 mL) of fruit juice. 1 cup or 8 fl oz (237 mL) of milk. 1 small or 4 oz (106 g) apple.  or 2 oz (63 g) of a medium banana. 1 cup or 5 oz (150 g) of strawberries. 3 cups or 1 oz (28.3 g) of popped popcorn. What is an example of carbohydrate counting? To calculate the grams of carbohydrates in this sample meal, follow the steps below. Sample meal 3 oz (85 g) chicken breast. ? cup or 4 oz (106 g) of brown rice.  cup or 3 oz (85 g) of corn. 1 cup or 8 fl oz (237 mL) of milk. 1 cup or 5 oz (150 g) of strawberries with sugar-free whipped topping. Carbohydrate calculation Identify the foods that have carbohydrates: Rice. Corn. Milk. Strawberries. Calculate how many servings you have of each food: 2 servings of rice. 1 serving of corn. 1 serving of milk. 1 serving of strawberries. Multiply each number of servings by 15 g: 2 servings of rice x 15 g = 30 g. 1 serving of corn x 15 g = 15 g. 1 serving of milk x 15 g = 15 g. 1 serving of strawberries x 15 g = 15 g. Add together all of the amounts to find the total grams of carbohydrates eaten: 30 g + 15 g + 15 g + 15 g = 75 g of carbohydrates total. Where to find more information To learn more, go to: American Diabetes Association at diabetes.org. Click Search and type carb counting. Find the link you need. Centers for Disease Control and Prevention at TonerPromos.no. Click Search and type diabetes. Find the link you need. Academy of Nutrition and  Dietetics: eatright.org This information is not intended to replace advice given to you by your health care provider. Make sure you discuss any questions you have with your health care provider. Document Revised: 11/17/2023 Document Reviewed: 11/17/2023 Elsevier Patient Education  2025 [employer].   Diabetes: Healthy Eating for Adults When you have diabetes, also called diabetes mellitus, it's important to have healthy eating habits. Your blood sugar (glucose) levels are greatly affected by what you eat and drink. You need to eat healthy foods in the right amounts, at about the same times each day. Doing this can help you: Manage your blood sugar. Lower your risk of heart disease. Improve your blood pressure. Reach or stay at a healthy weight. What can affect my meal plan? Every person with diabetes is different. And each person has different needs for a meal plan. Your health care provider may suggest that you  work with an Financial controller in healthy eating called a dietitian. They can help you make a meal plan that's best for you. How do carbohydrates affect me? Carbohydrates, also called carbs, affect your blood sugar level more than any other type of food. Eating carbs raises the amount of sugar in your blood. It's important to know how many carbs you can safely have in each meal. This is different for every person. Your dietitian can help you calculate how many carbs you should have at each meal and for each snack. How does alcohol affect me? Alcohol can cause a decrease in blood sugar (hypoglycemia), especially if you use insulin  or take certain diabetes medicines by mouth. Hypoglycemia can be a life-threatening condition. Symptoms of hypoglycemia are similar to those of having too much alcohol. They include confusion, being sleepy, and feeling dizzy. Do not drink alcohol if: Your provider tells you not to drink. You're pregnant, may be pregnant, or plan to become pregnant. What are tips for  following this plan? Reading food labels Start by checking the serving size on the Nutrition Facts label of packaged foods and drinks. The number of calories and the amount of carbs, fats, and other nutrients listed on the label are based on one serving of the item. Many items contain more than one serving per package. Check the total grams (g) of carbs in one serving. Check the number of grams of saturated fats and trans fats in one serving. Choose foods that have a low amount or none of these fats. Check the number of milligrams (mg) of salt (sodium) in one serving. Most people should limit their total sodium intake to less than 2,300 mg per day. Always check the nutrition information of foods labeled as low-fat or nonfat. These foods may be higher in added sugar or refined carbs and should be avoided. Talk to your dietitian to identify your daily goals for nutrients listed on the label. Shopping Avoid buying canned, pre-made, or processed foods. These foods tend to be high in fat, sodium, and added sugar. Shop around the outside edge of the grocery store. This is where you'll most often find fresh fruits and vegetables, bulk grains, fresh meats, and fresh dairy products. Cooking Use low-heat cooking methods, such as baking, instead of high-heat methods like deep frying. Cook using healthy oils, such as olive, canola, or sunflower oil. Avoid cooking with butter, cream, or high-fat meats. Meal planning  Eat meals and snacks regularly. Try to eat them at the same times every day. Avoid going too long without eating. Eat foods that are high in fiber, such as fresh fruits, vegetables, beans, and whole grains. Eat 4-6 oz (112-168 g) of lean protein each day, such as lean meat, chicken, fish, eggs, or tofu. One ounce (oz) (28 g) of lean protein is equal to: 1 oz (28 g) of meat, chicken, or fish. 1 egg.  cup (62 g) of tofu. Eat some foods each day that contain healthy fats, such as avocado,  nuts, seeds, and fish. What foods should I eat? Fruits Berries. Apples. Oranges. Peaches. Apricots. Plums. Grapes. Mangoes. Papayas. Pomegranates. Kiwi. Cherries. Vegetables Leafy greens, including lettuce, spinach, kale, chard, collard greens, mustard greens, and cabbage. Beets. Cauliflower. Broccoli. Carrots. Green beans. Tomatoes. Peppers. Onions. Cucumbers. Brussels sprouts. Grains Whole grains, such as whole-wheat or whole-grain bread, crackers, tortillas, cereal, and pasta. Unsweetened oatmeal. Quinoa. Brown or wild rice. Meats and other proteins Seafood. Poultry without skin. Lean cuts of poultry and beef. Tofu. Nuts. Seeds.  Dairy Low-fat or fat-free dairy products such as milk, yogurt, and cheese. The items listed above may not be all the foods and drinks you can have. Talk with a dietitian to learn more. What foods should I avoid? Fruits Fruits canned with syrup. Vegetables Canned vegetables. Frozen vegetables with butter or cream sauce. Grains Refined white flour and flour products such as bread, pasta, snack foods, and cereals. Avoid all processed foods. Meats and other proteins Fatty cuts of meat. Poultry with skin. Breaded or fried meats. Processed meat. Avoid saturated fats. Dairy Full-fat yogurt, cheese, or milk. Beverages Sweetened drinks, such as soda or iced tea. The items listed above may not be all the foods and drinks you should avoid. Talk with a dietitian to learn more. Where to find more information: To learn more, go to: Academy of Nutrition and Dietetics at DeathPrevention.it. Click Search and type diabetes. Find the link you need. Centers for Disease Control and Prevention at TonerPromos.no. Click Search and type diabetes. Find the link you need. American Diabetes Association: diabetes.org/food-nutrition [employer] of Diabetes and Digestive and Kidney Diseases: StageSync.si This information is not intended to replace advice given to you by your  health care provider. Make sure you discuss any questions you have with your health care provider. Document Revised: 11/18/2023 Document Reviewed: 11/18/2023 Elsevier Patient Education  2025 [employer].

## 2024-09-08 NOTE — Patient Outreach (Signed)
 Complex Care Management   Visit Note  09/08/2024  Name:  Sarah Duffy MRN: [id number] DOB: [birth year]  Situation: Referral received for Complex Care Management related to Heart Failure, Diabetes with Complications, and HTN I obtained verbal consent from Patient.  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 02/2021 Echo: EF 35-40%, glob HK, GrI DD, nl RV fxn, mildly dil LA, mod MR; b. 06/2022 Echo: EF 50-55%, no rwma, GrII DD, nl RV fxn, RVSP , mildly dil LA, mild MR, AoV sclerosis.   CKD (chronic kidney disease), stage III (HCC)    Complication of anesthesia 03/23/2023   Possible gastroparesis based off food in stomach during EGD   Coronary artery disease    a. 04/2021 Cath: LM nl, LAD 70p/m, 67m, 80d, LCX 42m, OM1 40, OM2 99 (fills via collats from OM1), RCA 60d, RPDA 75.   COVID-19 2021   Hyperlipidemia LDL goal <70    Hypertension    Ischemic cardiomyopathy    a. 02/2021 Echo: EF 35-40%, glob HK, GrI DD; b. 06/2022 Echo: EF 50-55%.   Moderate mitral regurgitation    a. 02/2021 Echo: Mod MR.   MRSA infection within last 3 months 02/25/2016   Osteomyelitis of foot (HCC) 08/26/2016   Type II diabetes mellitus (HCC)     Assessment: Patient Reported Symptoms:  Cognitive Cognitive Status: No symptoms reported Cognitive/Intellectual Conditions Management [RPT]: None reported or documented in medical history or problem list      Neurological Neurological Review of Symptoms: No symptoms reported    HEENT HEENT Symptoms Reported: No symptoms reported      Cardiovascular Cardiovascular Symptoms Reported: No symptoms reported Cardiovascular Management Strategies: Medication therapy, Routine screening Weight: 190 lb (86.2 kg) Cardiovascular Comment: no HF sx, will work on getting scale from [facility], patient has had flu,  Respiratory Respiratory Symptoms  Reported: Dry cough Other Respiratory Symptoms: tamiflu  completed for flu AB positive 08/28/24, still cough non productive though feels mucous Respiratory Management Strategies: Routine screening, Breathing exercise  Endocrine Endocrine Symptoms Reported: No symptoms reported Is patient diabetic?: Yes Is patient checking blood sugars at home?: Yes List most recent blood sugar readings, include date and time of day: Dexcom G7 when sick with flu some highs,  few lows due to not eating as much, appropriate treatement described, forgetting to screen shot FBG, advised to check BG using BGM when dexcom reading low, taking Basaglar  on schedule around 2100-2200, discussed healthy snack before bed to prevent lows, has been off since sick with flu, DM Educator Appointment on 11/3 for omnipod    Gastrointestinal        Genitourinary      Integumentary      Musculoskeletal Musculoskelatal Symptoms Reviewed: Back pain, Difficulty walking, Limited mobility, Muscle pain, Unsteady gait, Weakness Additional Musculoskeletal Details: Using walker, still awaiting elevated Commode Seat - will message PCP as per patient [facility] said not order received, noted PT case closed. patient reported being contacted by [facility] on a Sunday night while ill and thought suspicious call, spoke with OT during week and spoke with Bari Trippit who noted case was closed and will contact sales per patient to reopen. Patient will call RNCM if no return call/[facility] scheduling by Tuesday Musculoskeletal Management Strategies: Medication therapy, Medical device, Routine screening Falls in the past year?: No Number of falls in past year: 1 or less  Was there an injury with Fall?: No Fall Risk Category Calculator: 0 Patient Fall Risk Level: Low Fall Risk Patient at Risk for Falls Due to: Orthopedic patient, Impaired balance/gait, Impaired mobility Fall risk Follow up: Falls evaluation completed, Falls prevention discussed  Psychosocial  Psychosocial Symptoms Reported: No symptoms reported Additional Psychological Details: aware LCSW appointment 10/1     Do you feel physically threatened by others?: No    09/08/2024    PHQ2-9 Depression Screening   Little interest or pleasure in doing things Several days  Feeling down, depressed, or hopeless Not at all  PHQ-2 - Total Score 1  Trouble falling or staying asleep, or sleeping too much    Feeling tired or having little energy    Poor appetite or overeating     Feeling bad about yourself - or that you are a failure or have let yourself or your family down    Trouble concentrating on things, such as reading the newspaper or watching television    Moving or speaking so slowly that other people could have noticed.  Or the opposite - being so fidgety or restless that you have been moving around a lot more than usual    Thoughts that you would be better off dead, or hurting yourself in some way    PHQ2-9 Total Score    If you checked off any problems, how difficult have these problems made it for you to do your work, take care of things at home, or get along with other people    Depression Interventions/Treatment      There were no vitals filed for this visit.  Medications Reviewed Today     Reviewed by Devra Lands, RN (Registered Nurse) on 09/08/24 at 1518  Med List Status: <None>   Medication Order Taking? Sig Documenting Provider Last Dose Status Informant  albuterol  (VENTOLIN  HFA) 108 (90 Base) MCG/ACT inhaler [id number] Yes INHALE 2 PUFFS BY MOUTH EVERY 4 HOURS AS NEEDED FOR WHEEZE OR FOR SHORTNESS OF BREATH [name], Krichna, MD  Active Self, Pharmacy Records           Med Note CATHY MANCHESTER H   Thu Jul 20, 2024  2:11 PM)    amLODipine  (NORVASC ) 5 MG tablet [id number] Yes Take 1 tablet (5 mg total) by mouth daily. [name], Krichna, MD  Active Self, Pharmacy Records  aspirin  EC 81 MG tablet [id number] Yes Take by mouth. [provider]  Active Self, Pharmacy  Records  atorvastatin  (LIPITOR) 80 MG tablet [id number] Yes Take 1 tablet (80 mg total) by mouth daily. [name], Krichna, MD  Active Self, Pharmacy Records  benzonatate  (TESSALON ) 100 MG capsule [id number] Yes Take 1-2 capsules (100-200 mg total) by mouth 3 (three) times daily as needed. [name], Krichna, MD  Active   Blood Glucose Monitoring Suppl (ACCU-CHEK GUIDE) w/Device KIT [id number] Yes Use to check blood sugar 2 time daily as needed [name], Ibtehal Jaralla, MD  Active Self, Pharmacy Records  carvedilol  (COREG ) 12.5 MG tablet [id number] Yes TAKE 1 TABLET BY MOUTH 2 TIMES DAILY. Gerard Frederick, NP  Active Self, Pharmacy Records  Continuous Glucose Sensor (DEXCOM G7 SENSOR) [place] [id number] Yes 1 DEVICE BY DOES NOT APPLY ROUTE AS DIRECTED. [name], Donell Cardinal, MD  Active Self, Pharmacy Records  ENTRESTO  24-26 MG [id number] Yes Take 1 tablet by mouth 2 (two) times daily. Gerard Frederick, NP  Active Self, Pharmacy Records  ezetimibe  (ZETIA ) 10 MG tablet [id number] Yes Take 1 tablet (10 mg total) by mouth daily. [name], Krichna,  MD  Active Self, Pharmacy Records  gabapentin  (NEURONTIN ) 800 MG tablet [id number] Yes Take 800 mg by mouth 3 (three) times daily. [provider]  Active Self, Pharmacy Records  Galcanezumab -gnlm (EMGALITY ) 120 MG/ML SOSY [id number] Yes Inject 1 mL into the skin every 30 (thirty) days. [name], Krichna, MD  Active Self, Pharmacy Records  glucose blood (ACCU-CHEK GUIDE) test strip [id number] Yes Check blood sugar 2 times daily as needed [name], Ibtehal Jaralla, MD  Active Self, Pharmacy Records  insulin  aspart (NOVOLOG ) 100 UNIT/ML injection [id number] Yes Inject 10 Units into the skin with breakfast, with lunch, and with evening meal. Wouk, Devaughn Sayres, MD  Active   Insulin  Glargine (BASAGLAR  [facility]) 100 UNIT/ML [id number] Yes Inject 70 Units into the skin daily. [name], Donell Cardinal, MD  Active Self, Pharmacy Records  Insulin  Pen Needle 32G X 4 MM MISC  [id number] Yes 1 Device by Does not apply route in the morning, at noon, in the evening, and at bedtime. [name], Donell Cardinal, MD  Active Self, Pharmacy Records  ipratropium (ATROVENT ) 0.03 % nasal spray [id number] Yes Place 2 sprays into both nostrils 2 (two) times daily. [name], Krichna, MD  Active Self, Pharmacy Records  JARDIANCE  25 MG TABS tablet [id number] Yes Take 1 tablet (25 mg total) by mouth daily. [name], Donell Cardinal, MD  Active Self, Pharmacy Records  Lancets [facility] MULTICLIX) lancets [id number] Yes Check sugar 2 times daily as needed. [name], Donell Cardinal, MD  Active Self, Pharmacy Records  levocetirizine (XYZAL ) 5 MG tablet [id number] Yes Take 1 tablet (5 mg total) by mouth in the morning. [name], Krichna, MD  Active Self, Pharmacy Records  linaclotide  (LINZESS ) 290 MCG CAPS capsule [id number] Yes Take 1 capsule (290 mcg total) by mouth daily before breakfast. Therisa Bi, MD  Active Self, Pharmacy Records  nitroGLYCERIN  (NITROSTAT ) 0.4 MG SL tablet [id number] Yes PLACE 1 TABLET UNDER THE TONGUE EVERY 5 MINUTES AS NEEDED FOR CHEST PAIN. Darliss Rogue, MD  Active Self, Pharmacy Records  oseltamivir  (TAMIFLU ) 75 MG capsule [id number]  Take 1 capsule (75 mg total) by mouth 2 (two) times daily.  Patient not taking: Reported on 09/08/2024   [name], Krichna, MD  Active   OZEMPIC , 2 MG/DOSE, 8 MG/3ML SOPN [id number] Yes Inject 2 mg into the skin once a week. [provider]  Active Self, Pharmacy Records  Rimegepant Sulfate  (NURTEC) 75 MG TBDP [id number] Yes Take 1 tablet (75 mg total) by mouth daily as needed. Max of every other day prn [name], Krichna, MD  Active Self, Pharmacy Records  spironolactone  (ALDACTONE ) 25 MG tablet [id number] Yes Take 1 tablet (25 mg total) by mouth daily. Darliss Rogue, MD  Active Self, Pharmacy Records  traMADol  (ULTRAM ) 50 MG tablet [id number] Yes Take 1 tablet (50 mg total) by mouth every 6 (six) hours as needed for severe pain  (pain score 7-10). Wouk, Devaughn Sayres, MD  Active             Recommendation:   PCP Follow-up Continue Current Plan of Care BSW 09/11/24, LCSW 09/13/24 and DM Nutrition 10/16/24   Follow Up Plan:   Telephone follow-up in 1 month  [name], MSN, RN [facility]  [facility], [facility] RN Care Manager Direct Dial : [phone] Fax: [phone]

## 2024-09-11 ENCOUNTER — Encounter: Payer: Self-pay | Admitting: Neurosurgery

## 2024-09-11 ENCOUNTER — Other Ambulatory Visit: Payer: Self-pay

## 2024-09-11 ENCOUNTER — Telehealth: Payer: Self-pay

## 2024-09-11 ENCOUNTER — Ambulatory Visit: Admitting: Neurosurgery

## 2024-09-11 ENCOUNTER — Ambulatory Visit: Admitting: Family Medicine

## 2024-09-11 ENCOUNTER — Other Ambulatory Visit: Payer: Self-pay | Admitting: Family Medicine

## 2024-09-11 VITALS — BP 106/62 | Ht 69.0 in | Wt 190.0 lb

## 2024-09-11 DIAGNOSIS — G959 Disease of spinal cord, unspecified: Secondary | ICD-10-CM

## 2024-09-11 DIAGNOSIS — M4802 Spinal stenosis, cervical region: Secondary | ICD-10-CM

## 2024-09-11 DIAGNOSIS — M4712 Other spondylosis with myelopathy, cervical region: Secondary | ICD-10-CM

## 2024-09-11 DIAGNOSIS — Z794 Long term (current) use of insulin: Secondary | ICD-10-CM

## 2024-09-11 NOTE — Telephone Encounter (Signed)
 [employer] Outside Information From: [name], Krichna, MD  To: Penne LELON Sharps, MD  Subject: RE: [facility] patient    RE: [facility] patient Received: Today [name], Krichna, MD  Sharps Penne LELON, MD  She has a scheduled visit with me on October 23 rd. I can order Fructosamine when she comes in. Thank you       Previous Messages    ----- Message ----- From: Sharps Penne LELON, MD [facility]) Sent: 09/11/2024   4:31 PM EDT To: Dorette Loron, MD [facility]); Glinda Natzke, RN [facility]) Subject: [facility] patient  Hi everyone,  I just wanted to put is all in touch with 1 another.  I saw [name] again in clinic today.  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  She continues to have significant weakness in her hip flexors and hands.  Ideally she would be treated with a cervical decompression and fusion procedure to help protect her spinal cord, however we try to mitigate her risk is much as possible prior to surgery.  In order to decrease her perioperative complication profile we often like these patients to have sugar controls less than 180 for 3 weeks prior to doing fructosamine testing.  If we can get a acceptable level at that point then we could move forward with decompression as he has been shown to have some protective effect on perioperative complications.  I know she has had some recent active titrations, do think we should schedule her fructosamine testing in a month or so and then move forward with the procedure if she is within range?  Let me know what you all think.  Thank so much, Penne

## 2024-09-11 NOTE — Progress Notes (Signed)
 Referring Physician:  [name], Krichna, MD [address] [place],  [place] [address]  Primary Physician:  [name], Krichna, MD  History of Present Illness: 09/12/2024 Sarah Duffy is here today to follow-up.  She continues to have signs and symptoms consistent with cervical myelopathy.  We have been waiting for her blood sugars to become under better control prior to surgery as she is at increased risk for postoperative neurologic decline, fusion failure, infection, as well as perioperative cardiopulmonary risks.  She has been working with a new endocrinologist and is on a different plan.    The symptoms are causing a significant impact on the patient's life.   Review of Systems:  A 10 point review of systems is negative, except for the pertinent positives and negatives detailed in the HPI.  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 02/2021 Echo: EF 35-40%, glob HK, GrI DD, nl RV fxn, mildly dil LA, mod MR; b. 06/2022 Echo: EF 50-55%, no rwma, GrII DD, nl RV fxn, RVSP , mildly dil LA, mild MR, AoV sclerosis.   CKD (chronic kidney disease), stage III (HCC)    Complication of anesthesia 03/23/2023   Possible gastroparesis based off food in stomach during EGD   Coronary artery disease    a. 04/2021 Cath: LM nl, LAD 70p/m, 55m, 80d, LCX 48m, OM1 40, OM2 99 (fills via collats from OM1), RCA 60d, RPDA 75.   COVID-19 2021   Hyperlipidemia LDL goal <70    Hypertension    Ischemic cardiomyopathy    a. 02/2021 Echo: EF 35-40%, glob HK, GrI DD; b. 06/2022 Echo: EF 50-55%.   Moderate mitral regurgitation    a. 02/2021 Echo: Mod MR.   MRSA infection within last 3 months 02/25/2016   Osteomyelitis of foot (HCC) 08/26/2016   Type II diabetes mellitus (HCC)     Past Surgical History: Past Surgical History:  Procedure Laterality Date   CHOLECYSTECTOMY  1999   COLONOSCOPY WITH PROPOFOL  N/A  03/25/2021   Procedure: COLONOSCOPY WITH PROPOFOL ;  Surgeon: Therisa Bi, MD;  Location[facility] ENDOSCOPY;  Service: Gastroenterology;  Laterality: N/A;   COLONOSCOPY WITH PROPOFOL  N/A 04/17/2021   Procedure: COLONOSCOPY WITH PROPOFOL ;  Surgeon: Therisa Bi, MD;  Location: [facility] ENDOSCOPY;  Service: Gastroenterology;  Laterality: N/A;   ESOPHAGOGASTRODUODENOSCOPY (EGD) WITH PROPOFOL  N/A 03/25/2021   Procedure: ESOPHAGOGASTRODUODENOSCOPY (EGD) WITH PROPOFOL ;  Surgeon: Therisa Bi, MD;  Location: [facility] ENDOSCOPY;  Service: Gastroenterology;  Laterality: N/A;   ESOPHAGOGASTRODUODENOSCOPY (EGD) WITH PROPOFOL  N/A 04/17/2021   Procedure: ESOPHAGOGASTRODUODENOSCOPY (EGD) WITH PROPOFOL ;  Surgeon: Therisa Bi, MD;  Location: [facility] ENDOSCOPY;  Service: Gastroenterology;  Laterality: N/A;   ESOPHAGOGASTRODUODENOSCOPY (EGD) WITH PROPOFOL  N/A 03/23/2023   Procedure: ESOPHAGOGASTRODUODENOSCOPY (EGD) WITH PROPOFOL ;  Surgeon: Therisa Bi, MD;  Location: [facility] ENDOSCOPY;  Service: Gastroenterology;  Laterality: N/A;   RIGHT/LEFT HEART CATH AND CORONARY ANGIOGRAPHY Bilateral 04/14/2021   Procedure: RIGHT/LEFT HEART CATH AND CORONARY ANGIOGRAPHY;  Surgeon: Mady Bruckner, MD;  Location: [facility] INVASIVE CV LAB;  Service: Cardiovascular;  Laterality: Bilateral;   TOE SURGERY Left 02/07/2016   Pinky Toe    Allergies: Allergies as of 09/11/2024   (No Known Allergies)    Medications: Outpatient Encounter Medications as of 09/11/2024  Medication Sig   albuterol  (VENTOLIN  HFA) 108 (90 Base) MCG/ACT inhaler INHALE 2 PUFFS BY MOUTH EVERY 4 HOURS AS NEEDED FOR WHEEZE OR FOR SHORTNESS OF BREATH   amLODipine  (NORVASC )  5 MG tablet Take 1 tablet (5 mg total) by mouth daily.   aspirin  EC 81 MG tablet Take by mouth.   atorvastatin  (LIPITOR) 80 MG tablet Take 1 tablet (80 mg total) by mouth daily.   Blood Glucose Monitoring Suppl (ACCU-CHEK GUIDE) w/Device KIT Use to check blood sugar 2 time daily as needed   carvedilol  (COREG ) 12.5 MG  tablet TAKE 1 TABLET BY MOUTH 2 TIMES DAILY.   Continuous Glucose Sensor (DEXCOM G7 SENSOR) MISC 1 DEVICE BY DOES NOT APPLY ROUTE AS DIRECTED.   ENTRESTO  24-26 MG Take 1 tablet by mouth 2 (two) times daily.   ezetimibe  (ZETIA ) 10 MG tablet Take 1 tablet (10 mg total) by mouth daily.   gabapentin  (NEURONTIN ) 800 MG tablet Take 800 mg by mouth 3 (three) times daily.   Galcanezumab -gnlm (EMGALITY ) 120 MG/ML SOSY Inject 1 mL into the skin every 30 (thirty) days.   glucose blood (ACCU-CHEK GUIDE) test strip Check blood sugar 2 times daily as needed   insulin  aspart (NOVOLOG ) 100 UNIT/ML injection Inject 10 Units into the skin with breakfast, with lunch, and with evening meal.   Insulin  Glargine (BASAGLAR  KWIKPEN) 100 UNIT/ML Inject 70 Units into the skin daily.   Insulin  Pen Needle 32G X 4 MM MISC 1 Device by Does not apply route in the morning, at noon, in the evening, and at bedtime.   ipratropium (ATROVENT ) 0.03 % nasal spray Place 2 sprays into both nostrils 2 (two) times daily.   JARDIANCE  25 MG TABS tablet Take 1 tablet (25 mg total) by mouth daily.   Lancets (ACCU-CHEK MULTICLIX) lancets Check sugar 2 times daily as needed.   levocetirizine (XYZAL ) 5 MG tablet Take 1 tablet (5 mg total) by mouth in the morning.   linaclotide  (LINZESS ) 290 MCG CAPS capsule Take 1 capsule (290 mcg total) by mouth daily before breakfast.   nitroGLYCERIN  (NITROSTAT ) 0.4 MG SL tablet PLACE 1 TABLET UNDER THE TONGUE EVERY 5 MINUTES AS NEEDED FOR CHEST PAIN.   OZEMPIC , 2 MG/DOSE, 8 MG/3ML SOPN Inject 2 mg into the skin once a week.   Rimegepant Sulfate  (NURTEC) 75 MG TBDP Take 1 tablet (75 mg total) by mouth daily as needed. Max of every other day prn   spironolactone  (ALDACTONE ) 25 MG tablet Take 1 tablet (25 mg total) by mouth daily.   traMADol  (ULTRAM ) 50 MG tablet Take 1 tablet (50 mg total) by mouth every 6 (six) hours as needed for severe pain (pain score 7-10).   [DISCONTINUED] benzonatate  (TESSALON ) 100 MG  capsule Take 1-2 capsules (100-200 mg total) by mouth 3 (three) times daily as needed.   [DISCONTINUED] oseltamivir  (TAMIFLU ) 75 MG capsule Take 1 capsule (75 mg total) by mouth 2 (two) times daily. (Patient not taking: Reported on 09/08/2024)   No facility-administered encounter medications on file as of 09/11/2024.    Social History: Social History   Tobacco Use   Smoking status: Never   Smokeless tobacco: Never  Vaping Use   Vaping status: Never Used  Substance Use Topics   Alcohol use: Never   Drug use: Never    Family Medical History: Family History  Problem Relation Age of Onset   Diabetes Mother    Ulcers Mother    Heart disease Father    AAA (abdominal aortic aneurysm) Father    Diabetes Father    Hypertension Father    Stroke Father    Alzheimer's disease Father    Heart attack Sister    Seizures Brother  Diabetes Maternal Grandmother    Breast cancer Maternal Grandmother     Physical Examination:   General: Patient is well developed, well nourished, calm, collected, and in no apparent distress. Attention to examination is appropriate.  Psychiatric: Patient is non-anxious.  ENT:  Oral mucosa appears well hydrated.  Neck:   Supple.  Full range of motion.  Respiratory: Patient is breathing without any difficulty.  Extremities: No edema.  Vascular: Palpable dorsal pedal pulses.  Skin:   On exposed skin, there are no abnormal skin lesions.  NEUROLOGICAL:     Awake, alert, oriented to person, place, and time.  Speech is clear and fluent. Fund of knowledge is appropriate.   Cranial Nerves: Pupils equal round and reactive to light.    Patient is tender to palpation of her lumbar paraspinals.  Strength: Also has weakness in her deltoids approximately 4- out of 5 and intrinsic hand weakness 4 out of 5.  Side Iliopsoas Quads Hamstring PF DF EHL  R 2 5 5 5 4 5   L 2 5 5 5 4 5     Has developed some hyperreflexia at the bilateral brachial radialis and  has presence of a right sided Tromner reflex.  Clonus is not present.    Medical Decision Making  Imaging: MRI Lumbar Spine: IMPRESSION: 1. Multilevel degenerative changes of the lumbar spine as described above. 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 2. Moderate to severe spinal canal and bilateral lateral recess stenosis at L3-L4 and L5-S1.  I have personally reviewed the images and agree with the above interpretation.  Narrative & Impression  CLINICAL DATA:  Ascending weakness. Possible myelopathy. Weakness in both legs since 2024. Gait instability.   EXAM: MRI CERVICAL SPINE WITHOUT CONTRAST   TECHNIQUE: Multiplanar, multisequence MR imaging of the cervical spine was performed. No intravenous contrast was administered.   COMPARISON:  Thoracic and lumbar spine scoliosis series radiographs 11/22/2023   FINDINGS: Alignment: There is minimal, 1 mm grade 1 anterolisthesis of C4 on C5. 1-2 mm retrolisthesis of C5 on C6 and C6 on C7. Mild kyphotic angulation centered at C5.   Vertebrae: Vertebral body heights are maintained. Moderate to severe anterior and right C5-6 and diffuse C6-7 disc space narrowing. Mild anterior superior right C6-7 edematous marrow endplate degenerative changes. Large anterior C5-6 and C6-7 endplate osteophytes. The atlantodens interval is intact.   Cord: There is mild increased T2 signal within the right and left aspects of the cord at the C5-6 disc level (axial series 4, images 18 and 19) and within the left aspect of the cord just inferior to this (axial images 20 and 21). This is at and below region of C5-6 central canal stenosis described below. This increased T2 signal may represent chronic myelomalacia. Acute cord edema felt less likely.   Posterior Fossa, vertebral arteries, paraspinal tissues: The visualized posterior fossa is unremarkable. The vertebral artery flow voids are maintained. The visualized  paraspinal soft tissues are otherwise unremarkable.   Disc levels:   C2-3: Moderate left and moderate right facet joint hypertrophy. No posterior disc bulge. No central canal or neuroforaminal stenosis.   C3-4: Moderate bilateral facet joint hypertrophy. Note is made that the right vertebral artery extends deep into the right neural foramen (axial series 4, images 7 through 9). The left vertebral artery extends more moderately into the left neural foramen (axial series 4 images 5 through 7). Care should be taken during any neuroforaminal intervention at this level. Minimal midline posterior disc protrusion.  No central canal or neuroforaminal stenosis.   C4-5: Moderate to severe right and moderate left facet joint hypertrophy. Moderate right intraforaminal disc and endplate spurring with moderate to severe right neuroforaminal stenosis. Mild right lateral recess narrowing. The left neural foramina is patent. No central canal stenosis.   C5-6: Mild kyphotic angulation centered at C5-6. Moderate broad-based posterior disc osteophyte complex with mild midline posterior disc protrusion mildly to moderately impresses on the midline ventral cord. CSF is focally effaced posterior the cord. Moderate to high-grade bilateral intraforaminal disc and endplate spurring. Moderate to severe right greater than left neuroforaminal stenosis. Moderate central canal stenosis. The AP dimension of the central canal is narrowed down to 6 mm.   C6-7: Mild-to-moderate bilateral facet joint hypertrophy. Moderate broad-based posterior disc osteophyte complex with moderate to high-grade left and moderate right intraforaminal extension. Severe left and moderate to severe right neuroforaminal stenosis. Moderate left and mild right lateral recess narrowing. Mild to moderate central canal stenosis.   C7-T1: Mild to moderate bilateral facet joint hypertrophy. No posterior disc bulge. No central canal or  neuroforaminal stenosis.   Please see report from contemporaneous MRI of the thoracic spine for evaluation of the upper thoracic spine levels.   IMPRESSION: 1. Moderate C5-6 central canal stenosis with posterior disc osteophyte complex mildly to mildly impressing on the midline ventral cord. Mild increased T2 signal within the right and left aspects of the cord at the C5-6 disc level and within the left aspect of the cord just inferior to this. This is favored to represent chronic myelomalacia. Acute cord edema is felt less likely. 2. Additional multilevel degenerative disc and joint changes as above. 3. C5-6 moderate to severe right greater than left neuroforaminal stenosis. 4. C6-7 mild to moderate central canal stenosis. Severe left and moderate to severe right neuroforaminal stenosis. Moderate left and mild right lateral recess narrowing. 5. C4-5 moderate to severe right neuroforaminal stenosis. 6. Note is made that the right vertebral artery extends deep into the right C3-4 neural foramen. The left vertebral artery extends more moderately into the left C3-4 neural foramen. Although these are normal variants, care should be taken during any neuroforaminal intervention at this level.     Electronically Signed   By: Tanda Lyons M.D.   On: 04/13/2024 13:01     She has a mixture of disc herniation with ventral compression as well as a slight cervical kyphosis at this level causing a increased degree of compression, there is also T2 signal change throughout likely representing myelomalacia and indicated in her progressive cervical myelopathy.  Assessment and Plan: Ms. Tassin is a pleasant 53 y.o. female with chief complaint of bilateral hip weakness and ambulation difficulty.  She has known cervical myelopathy.  We have been following her for optimization of her blood sugars prior to surgery as this is often related to high risk comorbid complications.  She was recently seen in  the hospital and has continued symptoms of cervical myelopathy.  She does feel like her blood sugar control has been improving.  We feel that if she is able to get her sugars under control would like to intervene on her cervical myelopathy as she does continue to have difficulties with ambulation as well as upper extremity and lower extremity weakness.  I will plan to reach out to her PCP as well as her endocrinologist to see when we think she would be able to undergo surgery.  We generally like 3 weeks of tighter sugar control as prior  to fructosamine testing.  For her cervical myelopathy she has C5-C7 stenosis and would require a C5-C7 anterior cervical discectomy and fusion to decompress her cervical spinal cord and spinal nerve rootlets for her cervical myeloradiculopathy.  We discussed risks and benefits of surgery including but not limited to CSF leak, nerve injury, spine injury, need for further surgery, and her case specifically higher risk for cervical spinal infection given the diabetic control, also at higher risk for C5 palsy  Thank you for involving me in the care of this patient.   Penne MICAEL [facility] Dept. of Neurosurgery

## 2024-09-11 NOTE — Patient Outreach (Signed)
 Complex Care Management   Visit Note  09/11/2024  Name:  Sarah Duffy MRN: [id number] DOB: Oct 11, 1971  Situation: Referral received for Complex Care Management related to SDOH Barriers:  Housing rent Food insecurity Lack of essential utilities [place] I obtained verbal consent from Patient.  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 02/2021 Echo: EF 35-40%, glob HK, GrI DD, nl RV fxn, mildly dil LA, mod MR; b. 06/2022 Echo: EF 50-55%, no rwma, GrII DD, nl RV fxn, RVSP , mildly dil LA, mild MR, AoV sclerosis.   CKD (chronic kidney disease), stage III (HCC)    Complication of anesthesia 03/23/2023   Possible gastroparesis based off food in stomach during EGD   Coronary artery disease    a. 04/2021 Cath: LM nl, LAD 70p/m, 32m, 80d, LCX 26m, OM1 40, OM2 99 (fills via collats from OM1), RCA 60d, RPDA 75.   COVID-19 2021   Hyperlipidemia LDL goal <70    Hypertension    Ischemic cardiomyopathy    a. 02/2021 Echo: EF 35-40%, glob HK, GrI DD; b. 06/2022 Echo: EF 50-55%.   Moderate mitral regurgitation    a. 02/2021 Echo: Mod MR.   MRSA infection within last 3 months 02/25/2016   Osteomyelitis of foot (HCC) 08/26/2016   Type II diabetes mellitus (HCC)     Assessment: SW completed a telephone outreach with patient, she states she does not have any income and needs assistance with her utility bill, food, and a ramp. Patient states she has not applied for foodstamps but will. Patient has completed an application with Social Security administration. SW and patient agreed for resources to be emailed to address on file.  SDOH Interventions    Flowsheet Row Patient Outreach Telephone from 09/11/2024 in [place] POPULATION HEALTH DEPARTMENT Patient Outreach Telephone from 09/08/2024 in [place] POPULATION HEALTH DEPARTMENT Office Visit from 08/28/2024 in [facility] Patient Outreach Telephone from 08/25/2024 in [place] POPULATION HEALTH DEPARTMENT Office Visit from 03/09/2024 in [facility] Patient Outreach Telephone from 12/06/2023 in [place] POPULATION HEALTH DEPARTMENT  SDOH Interventions        Food Insecurity Interventions Community Resources Provided -- -- AMB Referral  [BSW financial strain] -- --  Housing Interventions Community Resources Provided Other (Comment)  [Rescheduled with BSW for 09/11/24] -- AMB Referral  [home was left to patient and sister and only patient lives there difficulty paying insurance and taxes] -- Intervention Not Indicated  Transportation Interventions -- -- -- Payor Benefit -- Intervention Not Indicated  Utilities Interventions -- -- -- AMB Referral  [BSW referral financial strain] -- Intervention Not Indicated  Alcohol Usage Interventions -- -- -- Intervention Not Indicated (Score <7) -- --  Depression Interventions/Treatment  -- -- Medication -- Medication --  Financial Strain Interventions -- -- -- Other (Comment)  [BSW referral] -- --  Physical Activity Interventions -- -- -- Intervention Not Indicated -- --  Stress Interventions -- -- -- Other (Comment)  [LCSW referral] -- --        Recommendation:   No recommendations at this time  Follow Up Plan:   Telephone follow-up 09/29/24 at 10:00am  Thersia Hoar, BSW, MHA [place]  [facility] Social Worker, Population Health [phone]

## 2024-09-11 NOTE — Patient Instructions (Signed)
 Visit Information  Ms. Ke was given information about Medicaid Managed Care team care coordination services as a part of their [facility] Community Plan Medicaid benefit.   If you would like to schedule transportation through your [facility], please call the following number at least 2 days in advance of your appointment: [phone]   Rides for urgent appointments can also be made after hours by calling Member Services.  Call the Behavioral Health Crisis Line at [phone], at any time, 24 hours a day, 7 days a week. If you are in danger or need immediate medical attention call 911.   Social Worker will follow up on 09/29/24 at 10am.   Thersia Hoar, BSW, MHA [place]  [facility] Social Worker, Population Health [phone]   Following is a copy of your plan of care:  There are no care plans that you recently modified to display for this patient.

## 2024-09-13 ENCOUNTER — Telehealth: Payer: Self-pay | Admitting: Licensed Clinical Social Worker

## 2024-09-13 ENCOUNTER — Telehealth: Admitting: *Deleted

## 2024-09-13 ENCOUNTER — Encounter: Payer: Self-pay | Admitting: Licensed Clinical Social Worker

## 2024-09-13 DIAGNOSIS — M48061 Spinal stenosis, lumbar region without neurogenic claudication: Secondary | ICD-10-CM | POA: Diagnosis not present

## 2024-09-13 NOTE — Patient Instructions (Signed)
 Sarah Duffy - I am sorry I was unable to reach you today for our scheduled appointment. I work with [name], Krichna, MD and am calling to support your healthcare needs. Please contact me at [phone] at your earliest convenience. I look forward to speaking with you soon.   Thank you,   Cena Ligas, LCSW Clinical Social Worker [facility] Population Health

## 2024-09-13 NOTE — Patient Outreach (Signed)
 CSW received a return call from patient. Reason for referral discussed. CSW scheduled patient for 09/20/24 at 10am to complete initial assessment.   Cena Ligas, LCSW Clinical Social Worker [facility] Population Health

## 2024-09-13 NOTE — Patient Outreach (Signed)
 CSW received a missed call from patient. CSW returned call and patient did not answer and voicemail box is now full.   Cena Ligas, LCSW Clinical Social Worker [facility] Population Health

## 2024-09-18 ENCOUNTER — Ambulatory Visit

## 2024-09-20 ENCOUNTER — Telehealth: Payer: Self-pay | Admitting: Licensed Clinical Social Worker

## 2024-09-20 ENCOUNTER — Encounter: Payer: Self-pay | Admitting: Licensed Clinical Social Worker

## 2024-09-20 ENCOUNTER — Other Ambulatory Visit: Payer: Self-pay | Admitting: Family Medicine

## 2024-09-20 DIAGNOSIS — G43009 Migraine without aura, not intractable, without status migrainosus: Secondary | ICD-10-CM

## 2024-09-20 DIAGNOSIS — E1159 Type 2 diabetes mellitus with other circulatory complications: Secondary | ICD-10-CM

## 2024-09-20 DIAGNOSIS — J111 Influenza due to unidentified influenza virus with other respiratory manifestations: Secondary | ICD-10-CM

## 2024-09-20 DIAGNOSIS — J4520 Mild intermittent asthma, uncomplicated: Secondary | ICD-10-CM

## 2024-09-20 NOTE — Patient Instructions (Signed)
 Sarah Duffy - I am sorry I was unable to reach you today for our scheduled appointment. I work with [name], Krichna, MD and am calling to support your healthcare needs. Please contact me at [phone] at your earliest convenience. I look forward to speaking with you soon.   Thank you,  Cena Ligas, LCSW Clinical Social Worker [facility] Population Health

## 2024-09-21 NOTE — Progress Notes (Shared)
 [facility] - Clinic Note  09/26/2024   CHIEF COMPLAINT Patient presents for No chief complaint on file.  HISTORY OF PRESENT ILLNESS: Sarah Duffy is a 53 y.o. female who presents to the clinic today for:    Pt states her vision in the right eye has decreased over the past several months, but she's been dealing with back pain so it's hard for her to focus on anything else, she went to the dr in October and was told she has a lot of inflammation, she tried physical therapy, which did not help, she was put on gabapentin  and lyrica , but she's on the highest dosage of both and it's not helping either, she got a epidural steroid shot on the left side of her back in March and is scheduled to have another one on the right side on Friday, she states her BP has been really good, but she's had several spike and drs don't know why  Referring physician: Glenard Mire, MD [address] [place],  [place] [address]  HISTORICAL INFORMATION:  Selected notes from the MEDICAL RECORD NUMBER Referred by [place], PA-C for diabetic eye exam LEE:  Ocular Hx- PDR OU, previously managed by [facility] and [facility]H-   CURRENT MEDICATIONS: No current outpatient medications on file. (Ophthalmic Drugs)   No current facility-administered medications for this visit. (Ophthalmic Drugs)   Current Outpatient Medications (Other)  Medication Sig   albuterol  (VENTOLIN  HFA) 108 (90 Base) MCG/ACT inhaler INHALE 2 PUFFS BY MOUTH EVERY 4 HOURS AS NEEDED FOR WHEEZE OR FOR SHORTNESS OF BREATH   amLODipine  (NORVASC ) 5 MG tablet Take 1 tablet (5 mg total) by mouth daily.   aspirin  EC 81 MG tablet Take by mouth.   atorvastatin  (LIPITOR) 80 MG tablet Take 1 tablet (80 mg total) by mouth daily.   Blood Glucose Monitoring Suppl (ACCU-CHEK GUIDE) w/Device KIT Use to check blood sugar 2 time daily as needed   carvedilol  (COREG ) 12.5 MG tablet TAKE 1 TABLET BY MOUTH 2 TIMES DAILY.   Continuous  Glucose Sensor (DEXCOM G7 SENSOR) MISC 1 DEVICE BY DOES NOT APPLY ROUTE AS DIRECTED.   ENTRESTO  24-26 MG Take 1 tablet by mouth 2 (two) times daily.   ezetimibe  (ZETIA ) 10 MG tablet Take 1 tablet (10 mg total) by mouth daily.   gabapentin  (NEURONTIN ) 800 MG tablet Take 800 mg by mouth 3 (three) times daily.   Galcanezumab -gnlm (EMGALITY ) 120 MG/ML SOSY Inject 1 mL into the skin every 30 (thirty) days.   glucose blood (ACCU-CHEK GUIDE) test strip Check blood sugar 2 times daily as needed   insulin  aspart (NOVOLOG ) 100 UNIT/ML injection Inject 10 Units into the skin with breakfast, with lunch, and with evening meal.   Insulin  Glargine (BASAGLAR  KWIKPEN) 100 UNIT/ML Inject 70 Units into the skin daily.   Insulin  Pen Needle 32G X 4 MM MISC 1 Device by Does not apply route in the morning, at noon, in the evening, and at bedtime.   ipratropium (ATROVENT ) 0.03 % nasal spray Place 2 sprays into both nostrils 2 (two) times daily.   JARDIANCE  25 MG TABS tablet Take 1 tablet (25 mg total) by mouth daily.   Lancets (ACCU-CHEK MULTICLIX) lancets Check sugar 2 times daily as needed.   levocetirizine (XYZAL ) 5 MG tablet Take 1 tablet (5 mg total) by mouth in the morning.   linaclotide  (LINZESS ) 290 MCG CAPS capsule Take 1 capsule (290 mcg total) by mouth daily before breakfast.  nitroGLYCERIN  (NITROSTAT ) 0.4 MG SL tablet PLACE 1 TABLET UNDER THE TONGUE EVERY 5 MINUTES AS NEEDED FOR CHEST PAIN.   OZEMPIC , 2 MG/DOSE, 8 MG/3ML SOPN Inject 2 mg into the skin once a week.   Rimegepant Sulfate  (NURTEC) 75 MG TBDP Take 1 tablet (75 mg total) by mouth daily as needed. Max of every other day prn   spironolactone  (ALDACTONE ) 25 MG tablet Take 1 tablet (25 mg total) by mouth daily.   traMADol  (ULTRAM ) 50 MG tablet Take 1 tablet (50 mg total) by mouth every 6 (six) hours as needed for severe pain (pain score 7-10).   No current facility-administered medications for this visit. 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 02/2021 Echo: EF 35-40%, glob HK, GrI DD, nl RV fxn, mildly dil LA, mod MR; b. 06/2022 Echo: EF 50-55%, no rwma, GrII DD, nl RV fxn, RVSP , mildly dil LA, mild MR, AoV sclerosis.   CKD (chronic kidney disease), stage III (HCC)    Complication of anesthesia 03/23/2023   Possible gastroparesis based off food in stomach during EGD   Coronary artery disease    a. 04/2021 Cath: LM nl, LAD 70p/m, 19m, 80d, LCX 39m, OM1 40, OM2 99 (fills via collats from OM1), RCA 60d, RPDA 75.   COVID-19 2021   Hyperlipidemia LDL goal <70    Hypertension    Ischemic cardiomyopathy    a. 02/2021 Echo: EF 35-40%, glob HK, GrI DD; b. 06/2022 Echo: EF 50-55%.   Moderate mitral regurgitation    a. 02/2021 Echo: Mod MR.   MRSA infection within last 3 months 02/25/2016   Osteomyelitis of foot (HCC) 08/26/2016   Type II diabetes mellitus (HCC)    Past Surgical History:  Procedure Laterality Date   CHOLECYSTECTOMY  1999   COLONOSCOPY WITH PROPOFOL  N/A 03/25/2021   Procedure: COLONOSCOPY WITH PROPOFOL ;  Surgeon: Therisa Bi, MD;  Location: [facility] ENDOSCOPY;  Service: Gastroenterology;  Laterality: N/A;   COLONOSCOPY WITH PROPOFOL  N/A 04/17/2021   Procedure: COLONOSCOPY WITH PROPOFOL ;  Surgeon: Therisa Bi, MD;  Location: [facility] ENDOSCOPY;  Service: Gastroenterology;  Laterality: N/A;   ESOPHAGOGASTRODUODENOSCOPY (EGD) WITH PROPOFOL  N/A 03/25/2021   Procedure: ESOPHAGOGASTRODUODENOSCOPY (EGD) WITH PROPOFOL ;  Surgeon: Therisa Bi, MD;  Location: [facility] ENDOSCOPY;  Service: Gastroenterology;  Laterality: N/A;   ESOPHAGOGASTRODUODENOSCOPY (EGD) WITH PROPOFOL  N/A 04/17/2021   Procedure: ESOPHAGOGASTRODUODENOSCOPY (EGD) WITH PROPOFOL ;  Surgeon: Therisa Bi, MD;  Location: [facility] ENDOSCOPY;  Service: Gastroenterology;  Laterality: N/A;    ESOPHAGOGASTRODUODENOSCOPY (EGD) WITH PROPOFOL  N/A 03/23/2023   Procedure: ESOPHAGOGASTRODUODENOSCOPY (EGD) WITH PROPOFOL ;  Surgeon: Therisa Bi, MD;  Location: [facility] ENDOSCOPY;  Service: Gastroenterology;  Laterality: N/A;   RIGHT/LEFT HEART CATH AND CORONARY ANGIOGRAPHY Bilateral 04/14/2021   Procedure: RIGHT/LEFT HEART CATH AND CORONARY ANGIOGRAPHY;  Surgeon: Mady Bruckner, MD;  Location: [facility] INVASIVE CV LAB;  Service: Cardiovascular;  Laterality: Bilateral;   TOE SURGERY Left 02/07/2016   Pinky Toe   FAMILY HISTORY Family History  Problem Relation Age of Onset   Diabetes Mother    Ulcers Mother    Heart disease Father    AAA (abdominal aortic aneurysm) Father    Diabetes Father    Hypertension Father    Stroke Father    Alzheimer's disease Father    Heart attack Sister    Seizures Brother    Diabetes Maternal  Grandmother    Breast cancer Maternal Grandmother    SOCIAL HISTORY Social History   Tobacco Use   Smoking status: Never   Smokeless tobacco: Never  Vaping Use   Vaping status: Never Used  Substance Use Topics   Alcohol use: Never   Drug use: Never       OPHTHALMIC EXAM:  Not recorded    IMAGING AND PROCEDURES  Imaging and Procedures for 09/26/2024         ASSESSMENT/PLAN: No diagnosis found.  1-4. Proliferative diabetic retinopathy, both eyes  - lost to f/u from 12.10.24 to 06.03.25   - previously managed by [facility] and [facility]  - h/o anti-VEGF therapy and PRP OS  - last A1c 8.4 on 07.08.24  - s/p IVA OU #1 (11.05.24), #2 (12.03.24), #3 (06.03.24)  - s/p PRP OS (11.18.24)  - s/p PRP OD (12.10.24) - exam shows blood stained vit condensations and tractional fibrosis OD; scattered MA/DBH OU - FA (11.05.24) shows OD: Scattered punctate leaking NV, scattered blockage from [facility], scattered patches of vascular non-perfusion, leaking MA; OS: Scattered leaking NV -- greatest SN midzone, scattered patches of vascular non-perfusion, leaking MA, scattered  incomplete PRP - OCT shows OD: Interval increase in vitreous opacities, scattered IRF / IRHM greatest temporal macula -- slightly improved, focal central PED, focal tractional edema along IT and ST arcades -- caught on widefield; OS: mild scattered cystic changes greatest nasal fovea and macula at 6 mos since last injxn  - recommend IVA OU #4 today 10.09.25 - pt wishes to proceed with laser - RBA of procedure discussed, questions answered - informed consent obtained and signed - see procedure note - f/u 4 weeks -- DFE/OCT, possible injxn  5,6. Hypertensive retinopathy OU - discussed importance of tight BP control - monitor  7. Mixed Cataract OU - The symptoms of cataract, surgical options, and treatments and risks were discussed with patient. - discussed diagnosis and progression - monitor  Ophthalmic Meds Ordered this visit:  No orders of the defined types were placed in this encounter.    No follow-ups on file.  There are no Patient Instructions on file for this visit.  Explained the diagnoses, plan, and follow up with the patient and they expressed understanding.  Patient expressed understanding of the importance of proper follow up care.   This document serves as a record of services personally performed by Redell JUDITHANN Hans, MD, PhD. It was created on their behalf by Wanda GEANNIE Keens, COT an ophthalmic technician. The creation of this record is the provider's dictation and/or activities during the visit.    Electronically signed by:  Wanda GEANNIE Keens, COT  09/21/24 7:23 AM   Redell JUDITHANN Hans, M.D., Ph.D. Diseases & Surgery of the Retina and Vitreous [facility] 09/26/2024    Abbreviations: M myopia (nearsighted); A astigmatism; H hyperopia (farsighted); P presbyopia; Mrx spectacle prescription;  CTL contact lenses; OD right eye; OS left eye; OU both eyes  XT exotropia; ET esotropia; PEK punctate epithelial keratitis; PEE punctate epithelial  erosions; DES dry eye syndrome; MGD meibomian gland dysfunction; ATs artificial tears; PFAT's preservative free artificial tears; NSC nuclear sclerotic cataract; PSC posterior subcapsular cataract; ERM epi-retinal membrane; PVD posterior vitreous detachment; RD retinal detachment; DM diabetes mellitus; DR diabetic retinopathy; NPDR non-proliferative diabetic retinopathy; PDR proliferative diabetic retinopathy; CSME clinically significant macular edema; DME diabetic macular edema; dbh dot blot hemorrhages; CWS cotton wool spot; POAG primary open angle glaucoma; C/D cup-to-disc ratio; HVF humphrey  visual field; GVF goldmann visual field; OCT optical coherence tomography; IOP intraocular pressure; BRVO Branch retinal vein occlusion; CRVO central retinal vein occlusion; CRAO central retinal artery occlusion; BRAO branch retinal artery occlusion; RT retinal tear; SB scleral buckle; PPV pars plana vitrectomy; VH Vitreous hemorrhage; PRP panretinal laser photocoagulation; IVK intravitreal kenalog ; VMT vitreomacular traction; MH Macular hole;  NVD neovascularization of the disc; NVE neovascularization elsewhere; AREDS age related eye disease study; ARMD age related macular degeneration; POAG primary open angle glaucoma; EBMD epithelial/anterior basement membrane dystrophy; ACIOL anterior chamber intraocular lens; IOL intraocular lens; PCIOL posterior chamber intraocular lens; Phaco/IOL phacoemulsification with intraocular lens placement; PRK photorefractive keratectomy; LASIK laser assisted in situ keratomileusis; HTN hypertension; DM diabetes mellitus; COPD chronic obstructive pulmonary disease

## 2024-09-25 ENCOUNTER — Ambulatory Visit

## 2024-09-25 DIAGNOSIS — M86172 Other acute osteomyelitis, left ankle and foot: Secondary | ICD-10-CM | POA: Diagnosis not present

## 2024-09-25 DIAGNOSIS — M25572 Pain in left ankle and joints of left foot: Secondary | ICD-10-CM | POA: Diagnosis not present

## 2024-09-25 DIAGNOSIS — I429 Cardiomyopathy, unspecified: Secondary | ICD-10-CM | POA: Diagnosis not present

## 2024-09-25 DIAGNOSIS — I5022 Chronic systolic (congestive) heart failure: Secondary | ICD-10-CM | POA: Diagnosis not present

## 2024-09-25 DIAGNOSIS — E113412 Type 2 diabetes mellitus with severe nonproliferative diabetic retinopathy with macular edema, left eye: Secondary | ICD-10-CM | POA: Diagnosis not present

## 2024-09-26 ENCOUNTER — Encounter (INDEPENDENT_AMBULATORY_CARE_PROVIDER_SITE_OTHER): Admitting: Ophthalmology

## 2024-09-26 DIAGNOSIS — Z7984 Long term (current) use of oral hypoglycemic drugs: Secondary | ICD-10-CM

## 2024-09-26 DIAGNOSIS — H35033 Hypertensive retinopathy, bilateral: Secondary | ICD-10-CM

## 2024-09-26 DIAGNOSIS — Z794 Long term (current) use of insulin: Secondary | ICD-10-CM

## 2024-09-26 DIAGNOSIS — H25813 Combined forms of age-related cataract, bilateral: Secondary | ICD-10-CM

## 2024-09-26 DIAGNOSIS — I10 Essential (primary) hypertension: Secondary | ICD-10-CM

## 2024-09-26 DIAGNOSIS — H4311 Vitreous hemorrhage, right eye: Secondary | ICD-10-CM

## 2024-09-26 DIAGNOSIS — E113513 Type 2 diabetes mellitus with proliferative diabetic retinopathy with macular edema, bilateral: Secondary | ICD-10-CM

## 2024-09-27 ENCOUNTER — Other Ambulatory Visit: Payer: Self-pay | Admitting: Licensed Clinical Social Worker

## 2024-09-27 ENCOUNTER — Other Ambulatory Visit: Payer: Self-pay | Admitting: Family Medicine

## 2024-09-27 NOTE — Patient Instructions (Signed)
 Visit Information  Thank you for taking time to visit with me today. Please don't hesitate to contact me if I can be of assistance to you before our next scheduled appointment.  Our next appointment is by telephone on 10/18/24 at 10am. Please call the care guide team at [phone] if you need to cancel or reschedule your appointment.   Following is a copy of your care plan:   Goals Addressed             This Visit's Progress    [facility] Social Work Care Plan LCSW       Problems:   Mental Health Concerns   CSW Clinical Goal(s):   Over the next 90 days the Patient will demonstrate a reduction in symptoms related to Depression: depressed mood as evidenced by a reduction of PHQ9 score and to begin therapy.  Interventions:  Mental Health:  Evaluation of current treatment plan related to Depression: depressed mood Active listening / Reflection utilized Financial risk analyst / information provided Depression screen reviewed Emotional Support Provided Made referral to [facility] Motivational Interviewing employed PHQ2/PHQ9 completed  Patient Goals/Self-Care Activities: Connect with provider to initiate mental health treatment.   Continue taking your medication as prescribed.   Use coping skills and relaxation tools  Plan:   Telephone follow up appointment with care management team member scheduled for:  10/18/24 at 10am        Please call the Suicide and Crisis Lifeline: 988 call the [place]  National Suicide Prevention Lifeline: 445 010 3834 or TTY: (563)267-1514 TTY (402)221-7203) to talk to a trained counselor call 1-800-273-TALK (toll free, 24 hour hotline) call 911 if you are experiencing a Mental Health or Behavioral Health Crisis or need someone to talk to.  Patient verbalizes understanding of instructions and care plan provided today and agrees to view in MyChart. Active MyChart status and patient understanding of how to access instructions and care plan  via MyChart confirmed with patient.     Cena Ligas, LCSW Clinical Social Worker [facility] Population [facility]

## 2024-09-27 NOTE — Patient Outreach (Signed)
 Complex Care Management   Visit Note  09/27/2024  Name:  Sarah Duffy MRN: [id number] DOB: 1971/11/27  Situation: Referral received for Complex Care Management related to Mental/Behavioral Health diagnosis Depression  I obtained verbal consent from Patient.  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 02/2021 Echo: EF 35-40%, glob HK, GrI DD, nl RV fxn, mildly dil LA, mod MR; b. 06/2022 Echo: EF 50-55%, no rwma, GrII DD, nl RV fxn, RVSP , mildly dil LA, mild MR, AoV sclerosis.   CKD (chronic kidney disease), stage III (HCC)    Complication of anesthesia 03/23/2023   Possible gastroparesis based off food in stomach during EGD   Coronary artery disease    a. 04/2021 Cath: LM nl, LAD 70p/m, 30m, 80d, LCX 41m, OM1 40, OM2 99 (fills via collats from OM1), RCA 60d, RPDA 75.   COVID-19 2021   Hyperlipidemia LDL goal <70    Hypertension    Ischemic cardiomyopathy    a. 02/2021 Echo: EF 35-40%, glob HK, GrI DD; b. 06/2022 Echo: EF 50-55%.   Moderate mitral regurgitation    a. 02/2021 Echo: Mod MR.   MRSA infection within last 3 months 02/25/2016   Osteomyelitis of foot (HCC) 08/26/2016   Type II diabetes mellitus (HCC)     Assessment: Patient Reported Symptoms:  Cognitive        Neurological      HEENT        Cardiovascular      Respiratory      Endocrine      Gastrointestinal        Genitourinary      Integumentary      Musculoskeletal          Psychosocial            09/27/2024    PHQ2-9 Depression Screening   Little interest or pleasure in doing things    Feeling down, depressed, or hopeless    PHQ-2 - Total Score    Trouble falling or staying asleep, or sleeping too much    Feeling tired or having little energy    Poor appetite or overeating     Feeling bad about yourself - or that you are a failure or have let yourself  or your family down    Trouble concentrating on things, such as reading the newspaper or watching television    Moving or speaking so slowly that other people could have noticed.  Or the opposite - being so fidgety or restless that you have been moving around a lot more than usual    Thoughts that you would be better off dead, or hurting yourself in some way    PHQ2-9 Total Score    If you checked off any problems, how difficult have these problems made it for you to do your work, take care of things at home, or get along with other people    Depression Interventions/Treatment      There were no vitals filed for this visit.  Medications Reviewed Today     Reviewed by Veva Bolt, LCSW (Social Worker) on 09/27/24 at 1117  Med List Status: <None>   Medication Order Taking? Sig Documenting Provider Last Dose Status Informant  albuterol  (VENTOLIN  HFA) 108 (90 Base) MCG/ACT inhaler [id number]  INHALE 2 PUFFS BY MOUTH EVERY 4 HOURS AS NEEDED FOR WHEEZE OR  FOR SHORTNESS OF BREATH [name], Krichna, MD  Active Self, Pharmacy Records           Med Note CATHY MANCHESTER H   Thu Jul 20, 2024  2:11 PM)    amLODipine  (NORVASC ) 5 MG tablet [id number]  Take 1 tablet (5 mg total) by mouth daily. [name], Krichna, MD  Active Self, Pharmacy Records  aspirin  EC 81 MG tablet [id number]  Take by mouth. [provider]  Active Self, Pharmacy Records  atorvastatin  (LIPITOR) 80 MG tablet [id number]  Take 1 tablet (80 mg total) by mouth daily. [name], Krichna, MD  Active Self, Pharmacy Records  Blood Glucose Monitoring Suppl (ACCU-CHEK GUIDE) w/Device PRESSLEY [id number]  Use to check blood sugar 2 time daily as needed [name], Ibtehal Jaralla, MD  Active Self, Pharmacy Records  carvedilol  (COREG ) 12.5 MG tablet [id number]  TAKE 1 TABLET BY MOUTH 2 TIMES DAILY. Gerard Frederick, NP  Active Self, Pharmacy Records  Continuous Glucose Sensor (DEXCOM G7 SENSOR) [place] [id number]  1 DEVICE BY DOES NOT APPLY ROUTE AS  DIRECTED. [name], Donell Cardinal, MD  Active Self, Pharmacy Records  ENTRESTO  24-26 MG [id number]  Take 1 tablet by mouth 2 (two) times daily. Gerard Frederick, NP  Active Self, Pharmacy Records  ezetimibe  (ZETIA ) 10 MG tablet [id number]  Take 1 tablet (10 mg total) by mouth daily. [name], Krichna, MD  Active Self, Pharmacy Records  gabapentin  (NEURONTIN ) 800 MG tablet [id number]  Take 800 mg by mouth 3 (three) times daily. [provider]  Active Self, Pharmacy Records  Galcanezumab -gnlm (EMGALITY ) 120 MG/ML SOSY [id number]  Inject 1 mL into the skin every 30 (thirty) days. [name], Krichna, MD  Active Self, Pharmacy Records  glucose blood (ACCU-CHEK GUIDE) test strip [id number]  Check blood sugar 2 times daily as needed [name], Ibtehal Jaralla, MD  Active Self, Pharmacy Records  insulin  aspart (NOVOLOG ) 100 UNIT/ML injection [id number]  Inject 10 Units into the skin with breakfast, with lunch, and with evening meal. Wouk, Devaughn Sayres, MD  Active   Insulin  Glargine (BASAGLAR  [facility]) 100 UNIT/ML [id number]  Inject 70 Units into the skin daily. [name], Donell Cardinal, MD  Active Self, Pharmacy Records  Insulin  Pen Needle 32G X 4 MM MISC [id number]  1 Device by Does not apply route in the morning, at noon, in the evening, and at bedtime. [name], Donell Cardinal, MD  Active Self, Pharmacy Records  ipratropium (ATROVENT ) 0.03 % nasal spray [id number]  PLACE 2 SPRAYS INTO BOTH NOSTRILS 2 (TWO) TIMES DAILY [name], Krichna, MD  Active   JARDIANCE  25 MG TABS tablet [id number]  Take 1 tablet (25 mg total) by mouth daily. [name], Donell Cardinal, MD  Active Self, Pharmacy Records  Lancets [facility] MULTICLIX) lancets [id number]  Check sugar 2 times daily as needed. [name], Donell Cardinal, MD  Active Self, Pharmacy Records  levocetirizine (XYZAL ) 5 MG tablet [id number]  Take 1 tablet (5 mg total) by mouth in the morning. [name], Krichna, MD  Active Self, Pharmacy Records  linaclotide   (LINZESS ) 290 MCG CAPS capsule [id number]  Take 1 capsule (290 mcg total) by mouth daily before breakfast. Therisa Bi, MD  Active Self, Pharmacy Records  nitroGLYCERIN  (NITROSTAT ) 0.4 MG SL tablet [id number]  PLACE 1 TABLET UNDER THE TONGUE EVERY 5 MINUTES AS NEEDED FOR CHEST PAIN. Darliss Rogue, MD  Active Self, Pharmacy Records  OZEMPIC , 2 MG/DOSE, 8 MG/3ML SOPN [id number]  Inject 2 mg into the skin once a week. [provider]  Active Self, Pharmacy Records  Rimegepant Sulfate  (NURTEC) 75  MG TBDP [id number]  Take 1 tablet (75 mg total) by mouth daily as needed. Max of every other day prn [name], Krichna, MD  Active Self, Pharmacy Records  spironolactone  (ALDACTONE ) 25 MG tablet [id number]  Take 1 tablet (25 mg total) by mouth daily. Darliss Rogue, MD  Active Self, Pharmacy Records  traMADol  (ULTRAM ) 50 MG tablet [id number]  Take 1 tablet (50 mg total) by mouth every 6 (six) hours as needed for severe pain (pain score 7-10). Wouk, Devaughn Sayres, MD  Active             Recommendation:   PCP Follow-up Complete intake with [facility]  Follow Up Plan:   Telephone follow up appointment date/time:  10/18/24 at 10am  Cena Ligas, LCSW Clinical Social Worker [facility] Population [facility]

## 2024-09-29 ENCOUNTER — Telehealth: Payer: Self-pay

## 2024-09-29 NOTE — Patient Instructions (Signed)
 Linsie Obi - I am sorry I was unable to reach you today for our scheduled appointment. I work with [name], Krichna, MD and am calling to support your healthcare needs. Please contact me at [phone] at your earliest convenience. I look forward to speaking with you soon.   Thank you,  Thersia Hoar, BSW, MHA [place]  [facility] Social Worker, Population Health [phone]

## 2024-10-02 ENCOUNTER — Ambulatory Visit

## 2024-10-02 DIAGNOSIS — M545 Low back pain, unspecified: Secondary | ICD-10-CM | POA: Diagnosis not present

## 2024-10-03 ENCOUNTER — Other Ambulatory Visit: Payer: Self-pay

## 2024-10-03 NOTE — Telephone Encounter (Unsigned)
 Copied from CRM #[id number]. Topic: Referral - Status >> Oct 03, 2024 10:45 AM [place] MATSU wrote: Reason for CRM:  Earnie from Dr. Sadie office called in requesting for a copy of the [place] healthcare authorization for the sleep study.  CB# [phone] Fax: [phone]

## 2024-10-03 NOTE — Patient Outreach (Signed)
 Complex Care Management   Visit Note  10/03/2024  Name:  Sarah Duffy MRN: [id number] DOB: 1971-08-05  Situation: Referral received for Complex Care Management related to Heart Failure, Diabetes with Complications, and HTN I obtained verbal consent from Patient.  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 02/2021 Echo: EF 35-40%, glob HK, GrI DD, nl RV fxn, mildly dil LA, mod MR; b. 06/2022 Echo: EF 50-55%, no rwma, GrII DD, nl RV fxn, RVSP , mildly dil LA, mild MR, AoV sclerosis.   CKD (chronic kidney disease), stage III (HCC)    Complication of anesthesia 03/23/2023   Possible gastroparesis based off food in stomach during EGD   Coronary artery disease    a. 04/2021 Cath: LM nl, LAD 70p/m, 35m, 80d, LCX 51m, OM1 40, OM2 99 (fills via collats from OM1), RCA 60d, RPDA 75.   COVID-19 2021   Hyperlipidemia LDL goal <70    Hypertension    Ischemic cardiomyopathy    a. 02/2021 Echo: EF 35-40%, glob HK, GrI DD; b. 06/2022 Echo: EF 50-55%.   Moderate mitral regurgitation    a. 02/2021 Echo: Mod MR.   MRSA infection within last 3 months 02/25/2016   Osteomyelitis of foot (HCC) 08/26/2016   Type II diabetes mellitus (HCC)     Assessment: Patient Reported Symptoms:  Cognitive Cognitive Status: No symptoms reported Cognitive/Intellectual Conditions Management [RPT]: None reported or documented in medical history or problem list   Health Maintenance Behaviors: Annual physical exam  Neurological Neurological Review of Symptoms: No symptoms reported Neurological Comment: no recent headaches, needs cervical decompression surgery  HEENT HEENT Symptoms Reported: Not assessed HEENT Management Strategies: Routine screening    Cardiovascular Cardiovascular Symptoms Reported: No symptoms reported Cardiovascular Management Strategies: Medication therapy, Routine  screening Weight: 190 lb (86.2 kg)  Respiratory Respiratory Symptoms Reported: No symptoms reported    Endocrine Is patient diabetic?: Yes List most recent blood sugar readings, include date and time of day: FBG 108 todat ranging 86-125 feels lows at 90 healthy snacks before bed    Gastrointestinal Gastrointestinal Symptoms Reported: No symptoms reported      Genitourinary Genitourinary Symptoms Reported: No symptoms reported    Integumentary Integumentary Symptoms Reported: No symptoms reported    Musculoskeletal Additional Musculoskeletal Details: using walker, in need of cervical decompression surgery pain 8/10 neck and back, needs labs including A!C before surgery scheduled, calling company to see if can exchange [facility] for higher   Falls in the past year?: No Number of falls in past year: 1 or less Was there an injury with Fall?: No Fall Risk Category Calculator: 0 Patient Fall Risk Level: Low Fall Risk Patient at Risk for Falls Due to: Orthopedic patient, Impaired mobility, Impaired balance/gait  Psychosocial Psychosocial Symptoms Reported: Irritability Additional Psychological Details: irritability re finances, health conditions   Major Change/Loss/Stressor/Fears (CP): Medical condition, self      10/03/2024    PHQ2-9 Depression Screening   Little interest or pleasure in doing things More than half the days  Feeling down, depressed, or hopeless Several days (just feeling down)  PHQ-2 - Total Score 3  Trouble falling or staying asleep, or sleeping too much Several days  Feeling tired or having little energy Several days  Poor appetite or overeating  Not at all  Feeling bad about yourself - or that you are a failure or  have let yourself or your family down Not at all  Trouble concentrating on things, such as reading the newspaper or watching television Several days  Moving or speaking so slowly that other people could have noticed.  Or the opposite - being so fidgety or  restless that you have been moving around a lot more than usual Not at all  Thoughts that you would be better off dead, or hurting yourself in some way Not at all  PHQ2-9 Total Score 6  If you checked off any problems, how difficult have these problems made it for you to do your work, take care of things at home, or get along with other people Somewhat difficult  Depression Interventions/Treatment Medication (in process of scheduling with community based [facility])    Vitals:   10/03/24 1518  BP: 120/72    Medications Reviewed Today     Reviewed by Devra Lands, RN (Registered Nurse) on 10/03/24 at 1510  Med List Status: <None>   Medication Order Taking? Sig Documenting Provider Last Dose Status Informant  albuterol  (VENTOLIN  HFA) 108 (90 Base) MCG/ACT inhaler [id number] Yes INHALE 2 PUFFS BY MOUTH EVERY 4 HOURS AS NEEDED FOR WHEEZE OR FOR SHORTNESS OF BREATH [name], Krichna, MD  Active Self, Pharmacy Records           Med Note CATHY MANCHESTER H   Thu Jul 20, 2024  2:11 PM)    amLODipine  (NORVASC ) 5 MG tablet [id number] Yes Take 1 tablet (5 mg total) by mouth daily. [name], Krichna, MD  Active Self, Pharmacy Records  aspirin  EC 81 MG tablet [id number] Yes Take by mouth. [provider]  Active Self, Pharmacy Records  atorvastatin  (LIPITOR) 80 MG tablet [id number] Yes Take 1 tablet (80 mg total) by mouth daily. [name], Krichna, MD  Active Self, Pharmacy Records  Blood Glucose Monitoring Suppl (ACCU-CHEK GUIDE) w/Device PRESSLEY [id number] Yes Use to check blood sugar 2 time daily as needed [name], Ibtehal Jaralla, MD  Active Self, Pharmacy Records  carvedilol  (COREG ) 12.5 MG tablet [id number] Yes TAKE 1 TABLET BY MOUTH 2 TIMES DAILY. Gerard Frederick, NP  Active Self, Pharmacy Records  Continuous Glucose Sensor (DEXCOM G7 SENSOR) [place] [id number] Yes 1 DEVICE BY DOES NOT APPLY ROUTE AS DIRECTED. [name], Donell Cardinal, MD  Active Self, Pharmacy Records  ENTRESTO  24-26 MG [id number] Yes Take  1 tablet by mouth 2 (two) times daily. Gerard Frederick, NP  Active Self, Pharmacy Records  ezetimibe  (ZETIA ) 10 MG tablet [id number] Yes Take 1 tablet (10 mg total) by mouth daily. [name], Krichna, MD  Active Self, Pharmacy Records  gabapentin  (NEURONTIN ) 800 MG tablet [id number] Yes Take 800 mg by mouth 3 (three) times daily. [provider]  Active Self, Pharmacy Records  Galcanezumab -gnlm (EMGALITY ) 120 MG/ML SOSY [id number] Yes Inject 1 mL into the skin every 30 (thirty) days. [name], Krichna, MD  Active Self, Pharmacy Records  glucose blood (ACCU-CHEK GUIDE) test strip [id number] Yes Check blood sugar 2 times daily as needed [name], Ibtehal Jaralla, MD  Active Self, Pharmacy Records  insulin  aspart (NOVOLOG ) 100 UNIT/ML injection [id number] Yes Inject 10 Units into the skin with breakfast, with lunch, and with evening meal. Wouk, Devaughn Sayres, MD  Active   Insulin  Glargine (BASAGLAR  [facility]) 100 UNIT/ML [id number] Yes Inject 70 Units into the skin daily. [name], Donell Cardinal, MD  Active Self, Pharmacy Records  Insulin  Pen Needle 32G X 4 MM MISC [id number] Yes 1 Device by Does not apply route in the morning, at noon, in the evening,  and at bedtime. [name], Donell Cardinal, MD  Active Self, Pharmacy Records  ipratropium (ATROVENT ) 0.03 % nasal spray [id number] Yes PLACE 2 SPRAYS INTO BOTH NOSTRILS 2 (TWO) TIMES DAILY [name], Krichna, MD  Active   JARDIANCE  25 MG TABS tablet [id number] Yes Take 1 tablet (25 mg total) by mouth daily. [name], Donell Cardinal, MD  Active Self, Pharmacy Records  Lancets [facility] MULTICLIX) lancets [id number] Yes Check sugar 2 times daily as needed. [name], Donell Cardinal, MD  Active Self, Pharmacy Records  levocetirizine (XYZAL ) 5 MG tablet [id number] Yes Take 1 tablet (5 mg total) by mouth in the morning. [name], Krichna, MD  Active Self, Pharmacy Records  linaclotide  (LINZESS ) 290 MCG CAPS capsule [id number] Yes Take 1 capsule (290 mcg total) by  mouth daily before breakfast. Therisa Bi, MD  Active Self, Pharmacy Records  nitroGLYCERIN  (NITROSTAT ) 0.4 MG SL tablet [id number] Yes PLACE 1 TABLET UNDER THE TONGUE EVERY 5 MINUTES AS NEEDED FOR CHEST PAIN. Darliss Rogue, MD  Active Self, Pharmacy Records  OZEMPIC , 2 MG/DOSE, 8 MG/3ML SOPN [id number] Yes Inject 2 mg into the skin once a week. [provider]  Active Self, Pharmacy Records  Rimegepant Sulfate  (NURTEC) 75 MG TBDP [id number] Yes Take 1 tablet (75 mg total) by mouth daily as needed. Max of every other day prn [name], Krichna, MD  Active Self, Pharmacy Records  spironolactone  (ALDACTONE ) 25 MG tablet [id number] Yes Take 1 tablet (25 mg total) by mouth daily. Darliss Rogue, MD  Active Self, Pharmacy Records  traMADol  (ULTRAM ) 50 MG tablet [id number] Yes Take 1 tablet (50 mg total) by mouth every 6 (six) hours as needed for severe pain (pain score 7-10). Wouk, Devaughn Sayres, MD  Active             Recommendation:   PCP Follow-up Continue Current Plan of Care  Follow Up Plan:   Telephone follow-up in 1 month  [name], MSN, RN [facility]  [facility], [facility] RN Care Manager Direct Dial : [phone] Fax: [phone]

## 2024-10-03 NOTE — Patient Instructions (Signed)
 Visit Information  Thank you for taking time to visit with me today. Please don't hesitate to contact me if I can be of assistance to you before our next scheduled appointment.  Your next care management appointment is by telephone on 10/31/2024 at 1:30 pm  Telephone follow-up in 1 month  Please call the care guide team at [phone] if you need to cancel, schedule, or reschedule an appointment.   Please call the Suicide and Crisis Lifeline: 988 call the [place]  National Suicide Prevention Lifeline: (443)151-8676 or TTY: 3302282821 TTY 305 064 4835) to talk to a trained counselor call 1-800-273-TALK (toll free, 24 hour hotline) go to [facility] [address], [place] [phone]) call 911 if you are experiencing a Mental Health or Behavioral Health Crisis or need someone to talk to.  [name], MSN, RN [facility], [facility] RN Care Manager Direct Dial : [phone] Fax: [phone]

## 2024-10-04 DIAGNOSIS — M545 Low back pain, unspecified: Secondary | ICD-10-CM | POA: Diagnosis not present

## 2024-10-05 ENCOUNTER — Ambulatory Visit: Admitting: Family Medicine

## 2024-10-06 DIAGNOSIS — M545 Low back pain, unspecified: Secondary | ICD-10-CM | POA: Diagnosis not present

## 2024-10-10 ENCOUNTER — Ambulatory Visit: Admitting: Cardiology

## 2024-10-10 NOTE — Progress Notes (Deleted)
  Cardiology Office Note   Date:  10/10/2024  ID:  Sarah Duffy, DOB 1971/04/10, MRN [id number] PCP: Glenard Mire, MD  [facility] Providers Cardiologist:  Redell Cave, MD { Click to update primary MD,subspecialty MD or APP then REFRESH:1}    History of Present Illness Sarah Duffy is a 53 y.o. female ***  ROS: ***  Studies Reviewed      *** Risk Assessment/Calculations {Does this patient have ATRIAL FIBRILLATION?:[phone]} No BP recorded.  {Refresh Note OR Click here to enter BP  :1}***       Physical Exam VS:  LMP  (LMP Unknown)        Wt Readings from Last 3 Encounters:  10/03/24 190 lb (86.2 kg)  09/11/24 190 lb (86.2 kg)  09/08/24 190 lb (86.2 kg)    GEN: Well nourished, well developed in no acute distress NECK: No JVD; No carotid bruits CARDIAC: ***RRR, no murmurs, rubs, gallops RESPIRATORY:  Clear to auscultation without rales, wheezing or rhonchi  ABDOMEN: Soft, non-tender, non-distended EXTREMITIES:  No edema; No deformity   ASSESSMENT AND PLAN ***    {Are you ordering a CV Procedure (e.g. stress test, cath, DCCV, TEE, etc)?   Press F2        :[id number]}  Dispo: ***  Signed, Samina Weekes, NP

## 2024-10-11 DIAGNOSIS — M5416 Radiculopathy, lumbar region: Secondary | ICD-10-CM | POA: Diagnosis not present

## 2024-10-11 DIAGNOSIS — M5412 Radiculopathy, cervical region: Secondary | ICD-10-CM | POA: Diagnosis not present

## 2024-10-12 ENCOUNTER — Telehealth: Payer: Self-pay

## 2024-10-12 NOTE — Patient Instructions (Signed)
 Sarah Duffy - I am sorry I was unable to reach you today for our scheduled appointment. I work with [name], Krichna, MD and am calling to support your healthcare needs. Please contact me at [phone] at your earliest convenience. I look forward to speaking with you soon.   Thank you,  Thersia Hoar, BSW, MHA [place]  [facility] Social Worker, Population Health [phone]

## 2024-10-16 ENCOUNTER — Other Ambulatory Visit: Payer: Self-pay

## 2024-10-16 ENCOUNTER — Ambulatory Visit: Admitting: Dietician

## 2024-10-16 NOTE — Telephone Encounter (Signed)
 Pt is wanting to know if they need to do anything else other than the test that is set to be done with Dr. Wiliam? Mris, xrays or anything?

## 2024-10-16 NOTE — Patient Outreach (Signed)
 Social Drivers of Health  Community Resource and Care Coordination Visit Note   10/16/2024  Name: Sarah Duffy MRN: [id number] DOB:04-15-1971  Situation: Referral received for [facility] needs assessment and assistance related to Financial Strain  Food Insecurity  ramp. I obtained verbal consent from Patient.  Visit completed with Patient on the phone.   Background:      Assessment:   Goals Addressed             This Visit's Progress    BSW [facility] Social Work Care Plan       Problems:   Corporate Treasurer , Food Insecurity , and ramp  CSW Clinical Goal(s):   Over the next 30 days the Patient will will follow up with resources provided as directed by Social Work.  Interventions:  SW emailed resources for food, utilities, and ramp.  Patient Goals/Self-Care Activities:  Patient will contact resources SW sent to email.  Plan:   The patient has been provided with contact information for the care management team and has been advised to call with any health related questions or concerns.         Recommendation:   Patient will contact SW for any future needs  Follow Up Plan:   Patient has achieved all patient stated goals. [employer] will be closed. Patient has been provided contact information should new needs arise.  Thersia Hoar, [name], MHA [place]  [facility] Social Worker, Population Health [phone]

## 2024-10-16 NOTE — Patient Instructions (Signed)
 Visit Information  [name] was given information about Medicaid Managed Care team care coordination services as a part of their [facility] Plan Medicaid benefit.   If you would like to schedule transportation through your [facility], please call the following number at least 2 days in advance of your appointment: [phone]   Rides for urgent appointments can also be made after hours by calling Member Services.  Call the Behavioral Health Crisis Line at [phone], at any time, 24 hours a day, 7 days a week. If you are in danger or need immediate medical attention call 911.   The  Patient                                              has been provided with contact information for the Managed Medicaid care management team and has been advised to call with any health related questions or concerns.   Sarah Duffy, [name], MHA [place]  [facility] Social Worker, Population Health [phone]   Following is a copy of your plan of care:  There are no care plans that you recently modified to display for this patient.

## 2024-10-18 ENCOUNTER — Other Ambulatory Visit: Payer: Self-pay | Admitting: Licensed Clinical Social Worker

## 2024-10-18 DIAGNOSIS — Z599 Problem related to housing and economic circumstances, unspecified: Secondary | ICD-10-CM

## 2024-10-18 DIAGNOSIS — R2689 Other abnormalities of gait and mobility: Secondary | ICD-10-CM

## 2024-10-18 DIAGNOSIS — G8929 Other chronic pain: Secondary | ICD-10-CM

## 2024-10-18 DIAGNOSIS — M545 Low back pain, unspecified: Secondary | ICD-10-CM

## 2024-10-18 DIAGNOSIS — Z794 Long term (current) use of insulin: Secondary | ICD-10-CM

## 2024-10-18 DIAGNOSIS — E1129 Type 2 diabetes mellitus with other diabetic kidney complication: Secondary | ICD-10-CM

## 2024-10-18 DIAGNOSIS — F32A Depression, unspecified: Secondary | ICD-10-CM

## 2024-10-18 DIAGNOSIS — R1310 Dysphagia, unspecified: Secondary | ICD-10-CM

## 2024-10-18 DIAGNOSIS — I10 Essential (primary) hypertension: Secondary | ICD-10-CM

## 2024-10-18 DIAGNOSIS — I5022 Chronic systolic (congestive) heart failure: Secondary | ICD-10-CM

## 2024-10-18 NOTE — Patient Outreach (Addendum)
 Complex Care Management   Visit Note  10/18/2024  Name:  Sarah Duffy MRN: [id number] DOB: [birth year]  Situation: Referral received for Complex Care Management related to SDOH Barriers:  Transportation, Depression, Financial Resource Strain I obtained verbal consent from Patient.  Visit completed with Patient  on the phone. Patient stated that she did hear back from [facility] and was informed since she has missed two appointments in the past they are unable to see her. LCSW and patient discussed referral for [employer]. Patient also shared that she is in pain and it is difficult for her to walk, sleep and use the bathroom. Patent states she was dismissed from [facility] due to missed appointments, and is requesting new clinic. Patient also shared she is supposed to be receiving HHPT with [employer] but was informed they are no longer accepting medicaid. LCSW will relay these concerns to [facility]. Patient reports concerns with not being able to work and pay her bills due to her back pain. Patient states she has started disability paperwork. Patient states she has received resources from [place] and has also called [employer] for asistence and its having a difficult time with affording her home and bills. Patient also shared that she is unsatisfied with PCA aid, patient plans on discussing this with the nursing director.   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 02/2021 Echo: EF 35-40%, glob HK, GrI DD, nl RV fxn, mildly dil LA, mod MR; b. 06/2022 Echo: EF 50-55%, no rwma, GrII DD, nl RV fxn, RVSP , mildly dil LA, mild MR, AoV sclerosis.   CKD (chronic kidney disease), stage III (HCC)    Complication of anesthesia 03/23/2023   Possible gastroparesis based off food in stomach during EGD   Coronary artery disease    a. 04/2021 Cath: LM nl, LAD 70p/m, 24m, 80d, LCX 62m, OM1 40, OM2 99 (fills via  collats from OM1), RCA 60d, RPDA 75.   COVID-19 2021   Hyperlipidemia LDL goal <70    Hypertension    Ischemic cardiomyopathy    a. 02/2021 Echo: EF 35-40%, glob HK, GrI DD; b. 06/2022 Echo: EF 50-55%.   Moderate mitral regurgitation    a. 02/2021 Echo: Mod MR.   MRSA infection within last 3 months 02/25/2016   Osteomyelitis of foot (HCC) 08/26/2016   Type II diabetes mellitus (HCC)     Assessment: Patient Reported Symptoms:  Cognitive Cognitive Status: Normal speech and language skills, Alert and oriented to person, place, and time Cognitive/Intellectual Conditions Management [RPT]: None reported or documented in medical history or problem list   Health Maintenance Behaviors: Annual physical exam Healing Pattern: Average Health Facilitated by: Rest  Neurological Neurological Review of Symptoms: No symptoms reported    HEENT HEENT Symptoms Reported: No symptoms reported      Cardiovascular Cardiovascular Symptoms Reported: No symptoms reported    Respiratory Respiratory Symptoms Reported: No symptoms reported    Endocrine Endocrine Symptoms Reported: No symptoms reported    Gastrointestinal Gastrointestinal Symptoms Reported: No symptoms reported      Genitourinary Genitourinary Symptoms Reported: No symptoms reported    Integumentary Integumentary Symptoms Reported: No symptoms reported    Musculoskeletal Musculoskelatal Symptoms Reviewed: Back pain, Difficulty walking, Limited mobility Additional Musculoskeletal Details: reports pain is 8/10, wants referral to new pain clinc. Musculoskeletal Management Strategies: Routine screening, Activity Musculoskeletal Self-Management Outcome: 2 (bad)  Psychosocial Psychosocial Symptoms Reported: Irritability Additional Psychological Details: Due to finances, back pain, and personal care aid not helping as much as she should          10/18/2024    PHQ2-9 Depression Screening   Little interest or pleasure in doing things     Feeling down, depressed, or hopeless    PHQ-2 - Total Score    Trouble falling or staying asleep, or sleeping too much    Feeling tired or having little energy    Poor appetite or overeating     Feeling bad about yourself - or that you are a failure or have let yourself or your family down    Trouble concentrating on things, such as reading the newspaper or watching television    Moving or speaking so slowly that other people could have noticed.  Or the opposite - being so fidgety or restless that you have been moving around a lot more than usual    Thoughts that you would be better off dead, or hurting yourself in some way    PHQ2-9 Total Score    If you checked off any problems, how difficult have these problems made it for you to do your work, take care of things at home, or get along with other people    Depression Interventions/Treatment      There were no vitals filed for this visit.  Medications Reviewed Today     Reviewed by Veva Bolt, LCSW (Social Worker) on 10/18/24 at 1006  Med List Status: <None>   Medication Order Taking? Sig Documenting Provider Last Dose Status Informant  albuterol  (VENTOLIN  HFA) 108 (90 Base) MCG/ACT inhaler [id number]  INHALE 2 PUFFS BY MOUTH EVERY 4 HOURS AS NEEDED FOR WHEEZE OR FOR SHORTNESS OF BREATH [name], Krichna, MD  Active Self, Pharmacy Records           Med Note CATHY MANCHESTER H   Thu Jul 20, 2024  2:11 PM)    amLODipine  (NORVASC ) 5 MG tablet [id number]  Take 1 tablet (5 mg total) by mouth daily. [name], Krichna, MD  Active Self, Pharmacy Records  aspirin  EC 81 MG tablet [id number]  Take by mouth. [provider]  Active Self, Pharmacy Records  atorvastatin  (LIPITOR) 80 MG tablet [id number]  Take 1 tablet (80 mg total) by mouth daily. [name], Krichna, MD  Active Self, Pharmacy Records  Blood Glucose Monitoring Suppl (ACCU-CHEK GUIDE) w/Device PRESSLEY [id number]  Use to check blood sugar 2 time daily as needed [name], Ibtehal  Jaralla, MD  Active Self, Pharmacy Records  carvedilol  (COREG ) 12.5 MG tablet [id number]  TAKE 1 TABLET BY MOUTH 2 TIMES DAILY. Gerard Frederick, NP  Active Self, Pharmacy Records  Continuous Glucose Sensor (DEXCOM G7 SENSOR) [place] [id number]  1 DEVICE BY DOES NOT APPLY ROUTE AS DIRECTED. [name], Donell Cardinal, MD  Active Self, Pharmacy Records  ENTRESTO  24-26 MG [id number]  Take 1 tablet by mouth 2 (two) times daily. Gerard Frederick, NP  Active Self, Pharmacy Records  ezetimibe  (ZETIA ) 10 MG tablet [id number]  Take 1 tablet (10 mg total) by mouth daily. [name], Krichna, MD  Active Self, Pharmacy Records  gabapentin  (NEURONTIN ) 800 MG tablet [id number]  Take 800 mg by mouth 3 (three) times daily. [provider]  Active Self, Pharmacy Records  Galcanezumab -gnlm (EMGALITY ) 120 MG/ML SOSY [id number]  Inject 1 mL into the skin every 30 (thirty) days. [name], Krichna, MD  Active Self, Pharmacy Records  glucose blood (ACCU-CHEK GUIDE) test strip [id number]  Check  blood sugar 2 times daily as needed [name], Donell Cardinal, MD  Active Self, Pharmacy Records  insulin  aspart (NOVOLOG ) 100 UNIT/ML injection [id number]  Inject 10 Units into the skin with breakfast, with lunch, and with evening meal. Wouk, Devaughn Sayres, MD  Active   Insulin  Glargine (BASAGLAR  [facility]) 100 UNIT/ML [id number]  Inject 70 Units into the skin daily. [name], Donell Cardinal, MD  Active Self, Pharmacy Records  Insulin  Pen Needle 32G X 4 MM MISC [id number]  1 Device by Does not apply route in the morning, at noon, in the evening, and at bedtime. [name], Donell Cardinal, MD  Active Self, Pharmacy Records  ipratropium (ATROVENT ) 0.03 % nasal spray [id number]  PLACE 2 SPRAYS INTO BOTH NOSTRILS 2 (TWO) TIMES DAILY [name], Krichna, MD  Active   JARDIANCE  25 MG TABS tablet [id number]  Take 1 tablet (25 mg total) by mouth daily. [name], Donell Cardinal, MD  Active Self, Pharmacy Records  Lancets [facility] MULTICLIX) lancets  [id number]  Check sugar 2 times daily as needed. [name], Donell Cardinal, MD  Active Self, Pharmacy Records  levocetirizine (XYZAL ) 5 MG tablet [id number]  Take 1 tablet (5 mg total) by mouth in the morning. [name], Krichna, MD  Active Self, Pharmacy Records  linaclotide  (LINZESS ) 290 MCG CAPS capsule [id number]  Take 1 capsule (290 mcg total) by mouth daily before breakfast. Therisa Bi, MD  Active Self, Pharmacy Records  nitroGLYCERIN  (NITROSTAT ) 0.4 MG SL tablet [id number]  PLACE 1 TABLET UNDER THE TONGUE EVERY 5 MINUTES AS NEEDED FOR CHEST PAIN. Darliss Rogue, MD  Active Self, Pharmacy Records  OZEMPIC , 2 MG/DOSE, 8 MG/3ML SOPN [id number]  Inject 2 mg into the skin once a week. [provider]  Active Self, Pharmacy Records  Rimegepant Sulfate  (NURTEC) 75 MG TBDP [id number]  Take 1 tablet (75 mg total) by mouth daily as needed. Max of every other day prn [name], Krichna, MD  Active Self, Pharmacy Records  spironolactone  (ALDACTONE ) 25 MG tablet [id number]  Take 1 tablet (25 mg total) by mouth daily. Darliss Rogue, MD  Active Self, Pharmacy Records  traMADol  (ULTRAM ) 50 MG tablet [id number]  Take 1 tablet (50 mg total) by mouth every 6 (six) hours as needed for severe pain (pain score 7-10). Wouk, Devaughn Sayres, MD  Active             Recommendation:   PCP Follow-up Continue Current Plan of Care  Follow Up Plan:   Telephone follow up appointment date/time:  11/01/24 at 10am.  Cena Ligas, LCSW Clinical Social Worker [facility] Population Health

## 2024-10-18 NOTE — Patient Instructions (Signed)
 Visit Information  Thank you for taking time to visit with me today. Please don't hesitate to contact me if I can be of assistance to you before our next scheduled appointment.  Our next appointment is by telephone on 11/01/24 at 10am. Please call the care guide team at [phone] if you need to cancel or reschedule your appointment.   Following is a copy of your care plan:   Goals Addressed             This Visit's Progress    VBCI Social Work Care Plan LCSW       Problems:   Mental Health Concerns   CSW Clinical Goal(s):   Over the next 90 days the Patient will demonstrate a reduction in symptoms related to Depression: depressed mood as evidenced by a reduction of PHQ9 score and to begin therapy.  Interventions:  Mental Health:  Evaluation of current treatment plan related to Depression: depressed mood Active listening / Reflection utilized Financial Risk Analyst / information provided Depression screen reviewed Emotional Support Provided Made referral to [facility] Motivational Interviewing employed PHQ2/PHQ9 completed 10/18/24-  Discussed referral for [facility]- pt denied due to missed appointments Discussed [facility] referral Discussed patients dismissal of pain clinic- requesting new clinic Discussed patients aid services- unhappy with current aid- plans to talk to nursing    director Discussed patient not being able to afford co-pay for medications Offered emotional support  Patient Goals/Self-Care Activities: Connect with provider to initiate mental health treatment.   Continue taking your medication as prescribed.   Use coping skills and relaxation tools  Plan:   Telephone follow up appointment with care management team member scheduled for:  11/01/24 at 10am        Please call the Suicide and Crisis Lifeline: 988 call the [place]  National Suicide Prevention Lifeline: 803-634-5265 or TTY: (530) 239-2841 TTY 838-717-4721) to talk  to a trained counselor call 1-800-273-TALK (toll free, 24 hour hotline) call 911 if you are experiencing a Mental Health or Behavioral Health Crisis or need someone to talk to.  Patient verbalized understanding of Care plan and visit instructions communicated this visit  Cena Ligas, LCSW Clinical Social Worker [facility] [employer]

## 2024-10-19 ENCOUNTER — Telehealth: Payer: Self-pay

## 2024-10-19 DIAGNOSIS — Z59868 Other specified financial insecurity: Secondary | ICD-10-CM

## 2024-10-19 NOTE — Telephone Encounter (Signed)
 LMOM informing patient there are no other tests needed at this time.

## 2024-10-19 NOTE — Patient Outreach (Signed)
 RNCM notified by LCSW patient with several requests during visit. Referred to [facility] due to reporting difficulty affording medications. Message to PCP for referral to new Pain Management due to discharged from current practice due to missed appointments and referral to new [facility] for PT since [facility] no longer taking Medicaid. PCP notified also given resources from BSW for financial strain and applying for disability.

## 2024-10-20 ENCOUNTER — Other Ambulatory Visit: Payer: Self-pay | Admitting: Physician Assistant

## 2024-10-20 ENCOUNTER — Telehealth: Payer: Self-pay

## 2024-10-20 ENCOUNTER — Other Ambulatory Visit: Payer: Self-pay | Admitting: Family Medicine

## 2024-10-20 DIAGNOSIS — M545 Low back pain, unspecified: Secondary | ICD-10-CM

## 2024-10-20 DIAGNOSIS — J4520 Mild intermittent asthma, uncomplicated: Secondary | ICD-10-CM

## 2024-10-20 DIAGNOSIS — M4712 Other spondylosis with myelopathy, cervical region: Secondary | ICD-10-CM

## 2024-10-20 NOTE — Telephone Encounter (Signed)
 Pt due for a follow up appt, previous appt no showed

## 2024-10-20 NOTE — Progress Notes (Signed)
 Complex Care Management Note  Care Guide Note 10/20/2024 Name: Daenerys Buttram MRN: [id number] DOB: 1971-05-23  Shakeerah Gradel is a 53 y.o. year old female who sees [name], Krichna, MD for primary care. I reached out to Miaisabella Adamczak by phone today to offer complex care management services.  Ms. Spake was given information about Complex Care Management services today including:   The Complex Care Management services include support from the care team which includes your Nurse Care Manager, Clinical Social Worker, or Pharmacist.  The Complex Care Management team is here to help remove barriers to the health concerns and goals most important to you. Complex Care Management services are voluntary, and the patient may decline or stop services at any time by request to their care team member.   Complex Care Management Consent Status: Patient agreed to services and verbal consent obtained.   Follow up plan:  Face to Face appointment with complex care management team member scheduled for:  10/24/24 at 2:30 p.m.   Encounter Outcome:  Patient Scheduled  Dreama [facility]  [facility], [facility] VBCI Assistant Direct Dial : [phone]  Fax: [phone]

## 2024-10-20 NOTE — Telephone Encounter (Signed)
 Patient is calling back and states she does not believe that she had the Fructosamine test done at her visit with her PCP as she tested positive for the flu. She would also like to know if Dr. Claudene could place a referral locally for her to see pain management.

## 2024-10-20 NOTE — Telephone Encounter (Signed)
 Please let her know we sent in referral for pain clinic, someone will call her to schedule.  Kendelyn/Katie: can she have the fructosamine test her to [facility]?

## 2024-10-23 NOTE — Telephone Encounter (Signed)
 Left patient a detailed message that her referral was sent and to call our office back with any questions.

## 2024-10-24 ENCOUNTER — Other Ambulatory Visit (INDEPENDENT_AMBULATORY_CARE_PROVIDER_SITE_OTHER)

## 2024-10-24 DIAGNOSIS — Z794 Long term (current) use of insulin: Secondary | ICD-10-CM

## 2024-10-24 DIAGNOSIS — Z7985 Long-term (current) use of injectable non-insulin antidiabetic drugs: Secondary | ICD-10-CM

## 2024-10-24 DIAGNOSIS — E118 Type 2 diabetes mellitus with unspecified complications: Secondary | ICD-10-CM

## 2024-10-24 NOTE — Telephone Encounter (Signed)
 Patient is calling to let our office know that she would like to have Dr. Claudene place the order for the Fructosamine test. She states her upcoming appointment is for a Pap and she does not think her provider is ordering any labs.

## 2024-10-24 NOTE — Addendum Note (Signed)
 Addended by: GIRARD DON GAILS on: 10/24/2024 01:52 PM   Modules accepted: Orders

## 2024-10-24 NOTE — Progress Notes (Signed)
 S:     Reason for visit: ?  Sarah Duffy is a 53 y.o. female with a history of diabetes (type 2), who presents today for a follow up diabetes Telephone pharmacotherapy visit.? Pertinent PMH also includes migraines, HFimpEF, CAD, HLD, CKD stage 3a.  Care Team: Primary Care Provider: [name], Krichna, MD  At last visit with endocrinology on 08/10/24, patient was started on Mounjaro  7.5 mg weekly in place of Ozempic . Basaglar  was decreased to 60 units d/t overnight hypoglycemia and Humalog was increased to 15 units TID. Discussed switching patient to [facility] after discharge from SNF at that time.   Today, patient reports she did not adjust insulin  doses after last endocrinology appointment. She does report some cost barriers at this time. She reports daily overnight hypoglycemia during which she will treat with orange juice.  Current diabetes medications include: Novolog  10 units TID, Basaglar  70 units nightly, Jardiance  25 mg daily,  Mounjaro  7.5 mg weekly Previous diabetes medications include: Ozempic , metformin  Current hypertension medications include: amlodipine  5 mg daily, carvedilol  12.5 mg BID, spironolactone  25 mg daily, Entresto  24/26 mg BID Current hyperlipidemia medications include: atorvastatin  80 mg daily, ezetimibe  10 mg daily  Patient reports adherence to taking all medications, however, has continued previous insulin  doses.   Have you been experiencing any side effects to the medications prescribed? no Do you have any problems obtaining medications due to transportation or finances? Yes - difficulty with Medicaid copays Insurance coverage: [place] [employer]  Patient reports overnight hypoglycemic events every night  DM Prevention:  Statin: Taking; high intensity.?  ACE/ARB: yes; Entresto  History of chronic kidney disease? yes Last urinary albumin/creatinine ratio:  Lab Results  Component Value Date   MICRALBCREAT 11.7 03/13/2024   MICRALBCREAT 67 (H) 03/09/2023    MICRALBCREAT 258 (H) 05/01/2022   MICRALBCREAT 3,924 (H) 02/11/2021   MICRALBCREAT 467 (H) 09/25/2019   MICRALBCREAT 345 (H) 06/23/2019   Last eye exam:  Lab Results  Component Value Date   HMDIABEYEEXA Retinopathy (A) 01/12/2023   Lab Results  Component Value Date   HMDIABEYEEXA Retinopathy (A) 01/12/2023   Last foot exam: 06/21/2023 Tobacco Use:  Tobacco Use: Low Risk (10/11/2024)   Received from [facility]   Patient History    Smoking Tobacco Use: Never    Smokeless Tobacco Use: Never    Passive Exposure: Past   O:  Dexcom Clarity Report     Vitals:  Wt Readings from Last 3 Encounters:  10/03/24 190 lb (86.2 kg)  09/11/24 190 lb (86.2 kg)  09/08/24 190 lb (86.2 kg)   BP Readings from Last 3 Encounters:  10/03/24 120/72  09/11/24 106/62  08/28/24 106/62   Pulse Readings from Last 3 Encounters:  08/28/24 82  07/27/24 76  07/05/24 92     Labs:?  Lab Results  Component Value Date   HGBA1C 10.4 (H) 07/21/2024   HGBA1C 9.3 05/04/2024   HGBA1C 10.6 01/07/2024   GLUCOSE 94 07/25/2024   MICRALBCREAT 11.7 03/13/2024   MICRALBCREAT 67 (H) 03/09/2023   MICRALBCREAT 258 (H) 05/01/2022   CREATININE 1.13 (H) 07/25/2024   CREATININE 1.19 (H) 07/21/2024   CREATININE 1.28 (H) 07/20/2024    Lab Results  Component Value Date   CHOL 138 03/09/2023   LDLCALC 79 03/09/2023   LDLCALC 95 05/01/2022   LDLCALC 115 (H) 08/04/2021   LDLDIRECT 72 11/02/2023   HDL 45 (L) 03/09/2023   TRIG 64 03/09/2023   TRIG 99 05/01/2022   TRIG 89 08/04/2021   ALT  15 12/10/2023   ALT 18 03/09/2023   AST 19 12/10/2023   AST 15 03/09/2023      Chemistry      Component Value Date/Time   NA 136 07/25/2024 0416   NA 138 11/02/2023 0953   K 4.8 07/25/2024 0416   CL 108 07/25/2024 0416   CO2 24 07/25/2024 0416   BUN 50 (H) 07/25/2024 0416   BUN 23 11/02/2023 0953   CREATININE 1.13 (H) 07/25/2024 0416   CREATININE 1.12 (H) 12/10/2023 1406      Component Value Date/Time    CALCIUM  9.3 07/25/2024 0416   ALKPHOS 61 09/17/2021 1415   AST 19 12/10/2023 1406   ALT 15 12/10/2023 1406   BILITOT 0.5 12/10/2023 1406   BILITOT 0.4 02/28/2016 0951       The 10-year ASCVD risk score (Arnett DK, et al., 2019) is: 3.5%  Lab Results  Component Value Date   MICRALBCREAT 11.7 03/13/2024   MICRALBCREAT 67 (H) 03/09/2023   MICRALBCREAT 258 (H) 05/01/2022   MICRALBCREAT 3,924 (H) 02/11/2021   MICRALBCREAT 467 (H) 09/25/2019   MICRALBCREAT 345 (H) 06/23/2019    A/P: Diabetes currently uncontrolled with a most recent A1c of 10.4% on 07/21/24, however, GMI is controlled at 6.8%. Patient is able to verbalize appropriate hypoglycemia management plan. Medication adherence appears appropriate, however, patient is experiencing overnight hypoglycemia on a nightly basis. Did not decrease basal insulin  as instructed previously by endocrinology. Will decrease dose at this time. Additionally, patient reports some cost concerns with medications. Discussed options of utilizing a pharmacy that offers a charge account, where she can pick up her medications and pay later on as she is able.  -Decreased dose of basal insulin  Basaglar  (insulin  glargine)  from 70 to 60 units nightly as instructed by Endocrinology. -Continued rapid insulin  Novolog  (insulin  aspart) 10 units TID.  -Continued GLP-1 Mounjaro  (tirzepatide )  7.5 mg weekly -Continued SGLT2-I Jardiance  (empagliflozin ) 25 mg daily.  -Extensively discussed pathophysiology of diabetes, recommended lifestyle interventions, dietary effects on blood sugar control.  -Counseled on s/sx of and management of hypoglycemia.  -Next A1c anticipated 10/2024.  -Continue monitoring BG via Dexcom G7 -Discuss utilizing a charge account at [facility] pharmacy at follow up with PCP.  -Reschedule follow up with endocrinology  ASCVD risk - secondary prevention in patient with diabetes. Last LDL is 72 mg/dL, not at goal of <29 mg/dL.  -Continued  atorvastatin  80 mg daily.  -Continued ezetimibe  10 mg daily   Patient verbalized understanding of treatment plan. Total time patient counseling 45 minutes.  Follow-up:  Pharmacist on 10/31/24 PCP clinic visit on 10/31/24  Peyton CHARLENA Ferries, PharmD, CPP Clinical Pharmacist [facility] [phone]

## 2024-10-24 NOTE — Telephone Encounter (Signed)
 Order placed, patient advised. Patient will go to [facility] to get this done but will call if she needs the order to go to another location

## 2024-10-26 NOTE — Progress Notes (Shared)
 [facility] - Clinic Note  10/30/2024   CHIEF COMPLAINT Patient presents for No chief complaint on file.  HISTORY OF PRESENT ILLNESS: Sarah Duffy is a 53 y.o. female who presents to the clinic today for:    Pt states her vision in the right eye has decreased over the past several months, but she's been dealing with back pain so it's hard for her to focus on anything else, she went to the dr in October and was told she has a lot of inflammation, she tried physical therapy, which did not help, she was put on gabapentin  and lyrica , but she's on the highest dosage of both and it's not helping either, she got a epidural steroid shot on the left side of her back in March and is scheduled to have another one on the right side on Friday, she states her BP has been really good, but she's had several spike and drs don't know why  Referring physician: Glenard Mire, MD [address] [place],  [place] [address]  HISTORICAL INFORMATION:  Selected notes from the MEDICAL RECORD NUMBER Referred by [place], PA-C for diabetic eye exam LEE:  Ocular Hx- PDR OU, previously managed by [facility] and [facility]-   CURRENT MEDICATIONS: No current outpatient medications on file. (Ophthalmic Drugs)   No current facility-administered medications for this visit. (Ophthalmic Drugs)   Current Outpatient Medications (Other)  Medication Sig   amLODipine  (NORVASC ) 5 MG tablet Take 1 tablet (5 mg total) by mouth daily.   aspirin  EC 81 MG tablet Take by mouth.   atorvastatin  (LIPITOR) 80 MG tablet Take 1 tablet (80 mg total) by mouth daily.   Blood Glucose Monitoring Suppl (ACCU-CHEK GUIDE) w/Device KIT Use to check blood sugar 2 time daily as needed   carvedilol  (COREG ) 12.5 MG tablet TAKE 1 TABLET BY MOUTH 2 TIMES DAILY.   Continuous Glucose Sensor (DEXCOM G7 SENSOR) MISC 1 DEVICE BY DOES NOT APPLY ROUTE AS DIRECTED.   ENTRESTO  24-26 MG Take 1 tablet by mouth 2 (two) times  daily.   ezetimibe  (ZETIA ) 10 MG tablet Take 1 tablet (10 mg total) by mouth daily.   gabapentin  (NEURONTIN ) 800 MG tablet Take 800 mg by mouth 3 (three) times daily.   Galcanezumab -gnlm (EMGALITY ) 120 MG/ML SOSY Inject 1 mL into the skin every 30 (thirty) days.   glucose blood (ACCU-CHEK GUIDE) test strip Check blood sugar 2 times daily as needed   insulin  aspart (NOVOLOG ) 100 UNIT/ML injection Inject 10 Units into the skin with breakfast, with lunch, and with evening meal.   Insulin  Glargine (BASAGLAR  KWIKPEN) 100 UNIT/ML Inject 70 Units into the skin daily.   Insulin  Pen Needle 32G X 4 MM MISC 1 Device by Does not apply route in the morning, at noon, in the evening, and at bedtime.   ipratropium (ATROVENT ) 0.03 % nasal spray PLACE 2 SPRAYS INTO BOTH NOSTRILS 2 (TWO) TIMES DAILY   JARDIANCE  25 MG TABS tablet Take 1 tablet (25 mg total) by mouth daily.   Lancets (ACCU-CHEK MULTICLIX) lancets Check sugar 2 times daily as needed.   levocetirizine (XYZAL ) 5 MG tablet Take 1 tablet (5 mg total) by mouth in the morning.   linaclotide  (LINZESS ) 290 MCG CAPS capsule Take 1 capsule (290 mcg total) by mouth daily before breakfast.   MOUNJARO  7.5 MG/0.5ML Pen Inject 7.5 mg into the skin once a week.   nitroGLYCERIN  (NITROSTAT ) 0.4 MG SL tablet PLACE 1 TABLET UNDER THE  TONGUE EVERY 5 MINUTES AS NEEDED FOR CHEST PAIN.   Rimegepant Sulfate  (NURTEC) 75 MG TBDP Take 1 tablet (75 mg total) by mouth daily as needed. Max of every other day prn   spironolactone  (ALDACTONE ) 25 MG tablet Take 1 tablet (25 mg total) by mouth daily.   VENTOLIN  HFA 108 (90 Base) MCG/ACT inhaler INHALE 2 PUFFS BY MOUTH EVERY 4 HOURS AS NEEDED FOR WHEEZE OR FOR SHORTNESS OF BREATH   No current facility-administered medications for this visit. 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 02/2021 Echo: EF 35-40%, glob HK, GrI DD, nl RV fxn, mildly dil LA, mod MR; b. 06/2022 Echo: EF 50-55%, no rwma, GrII DD, nl RV fxn, RVSP , mildly dil LA, mild MR, AoV sclerosis.   CKD (chronic kidney disease), stage III (HCC)    Complication of anesthesia 03/23/2023   Possible gastroparesis based off food in stomach during EGD   Coronary artery disease    a. 04/2021 Cath: LM nl, LAD 70p/m, 58m, 80d, LCX 33m, OM1 40, OM2 99 (fills via collats from OM1), RCA 60d, RPDA 75.   COVID-19 2021   Hyperlipidemia LDL goal <70    Hypertension    Ischemic cardiomyopathy    a. 02/2021 Echo: EF 35-40%, glob HK, GrI DD; b. 06/2022 Echo: EF 50-55%.   Moderate mitral regurgitation    a. 02/2021 Echo: Mod MR.   MRSA infection within last 3 months 02/25/2016   Osteomyelitis of foot (HCC) 08/26/2016   Type II diabetes mellitus (HCC)    Past Surgical History:  Procedure Laterality Date   CHOLECYSTECTOMY  1999   COLONOSCOPY WITH PROPOFOL  N/A 03/25/2021   Procedure: COLONOSCOPY WITH PROPOFOL ;  Surgeon: Therisa Bi, MD;  Location: [facility] ENDOSCOPY;  Service: Gastroenterology;  Laterality: N/A;   COLONOSCOPY WITH PROPOFOL  N/A 04/17/2021   Procedure: COLONOSCOPY WITH PROPOFOL ;  Surgeon: Therisa Bi, MD;  Location: [facility] ENDOSCOPY;  Service: Gastroenterology;  Laterality: N/A;   ESOPHAGOGASTRODUODENOSCOPY (EGD) WITH PROPOFOL  N/A 03/25/2021   Procedure: ESOPHAGOGASTRODUODENOSCOPY (EGD) WITH PROPOFOL ;  Surgeon: Therisa Bi, MD;  Location: [facility] ENDOSCOPY;  Service: Gastroenterology;  Laterality: N/A;   ESOPHAGOGASTRODUODENOSCOPY (EGD) WITH PROPOFOL  N/A 04/17/2021   Procedure: ESOPHAGOGASTRODUODENOSCOPY (EGD) WITH PROPOFOL ;  Surgeon: Therisa Bi, MD;  Location: [facility] ENDOSCOPY;  Service: Gastroenterology;  Laterality: N/A;   ESOPHAGOGASTRODUODENOSCOPY (EGD) WITH PROPOFOL  N/A 03/23/2023   Procedure: ESOPHAGOGASTRODUODENOSCOPY (EGD) WITH PROPOFOL ;  Surgeon: Therisa Bi, MD;  Location: [facility] ENDOSCOPY;  Service:  Gastroenterology;  Laterality: N/A;   RIGHT/LEFT HEART CATH AND CORONARY ANGIOGRAPHY Bilateral 04/14/2021   Procedure: RIGHT/LEFT HEART CATH AND CORONARY ANGIOGRAPHY;  Surgeon: Mady Bruckner, MD;  Location: [facility] INVASIVE CV LAB;  Service: Cardiovascular;  Laterality: Bilateral;   TOE SURGERY Left 02/07/2016   Pinky Toe   FAMILY HISTORY Family History  Problem Relation Age of Onset   Diabetes Mother    Ulcers Mother    Heart disease Father    AAA (abdominal aortic aneurysm) Father    Diabetes Father    Hypertension Father    Stroke Father    Alzheimer's disease Father    Heart attack Sister    Seizures Brother    Diabetes Maternal Grandmother    Breast cancer Maternal Grandmother    SOCIAL HISTORY Social History   Tobacco Use   Smoking status: Never   Smokeless tobacco: Never  Vaping Use   Vaping status: Never Used  Substance Use Topics   Alcohol use: Never   Drug use: Never       OPHTHALMIC EXAM:  Not recorded    IMAGING AND PROCEDURES  Imaging and Procedures for 10/30/2024         ASSESSMENT/PLAN:   ICD-10-CM   1. 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 edema associated with type 2 diabetes mellitus (HCC)  Z88.6486     2. Encounter for long-term (current) use of insulin  (HCC)  Z79.4     3. Long term (current) use of oral hypoglycemic drugs  Z79.84     4. Vitreous hemorrhage, right eye (HCC)  H43.11     5. Essential hypertension  I10     6. Hypertensive retinopathy of both eyes  H35.033     7. 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 Proliferative diabetic retinopathy, both eyes  - lost to f/u from 12.10.24 to 06.03.25   - previously managed by [facility] and [facility]  - h/o anti-VEGF therapy and PRP OS  - last A1c 10.4 (08.08.25), 8.4 on 07.08.24  - s/p IVA OU #1 (11.05.24), #2 (12.03.24), #3 (06.03.24)  - s/p PRP OS (11.18.24)  - s/p PRP OD (12.10.24) - exam shows blood stained vit  condensations and tractional fibrosis OD; scattered MA/DBH OU - FA (11.05.24) shows OD: Scattered punctate leaking NV, scattered blockage from [facility], scattered patches of vascular non-perfusion, leaking MA; OS: Scattered leaking NV -- greatest SN midzone, scattered patches of vascular non-perfusion, leaking MA, scattered incomplete PRP - OCT shows OD: Interval increase in vitreous opacities, scattered IRF / IRHM greatest temporal macula -- slightly improved, focal central PED, focal tractional edema along IT and ST arcades -- caught on widefield; OS: mild scattered cystic changes greatest nasal fovea and macula at 6 mos since last injxn  - recommend IVA today OU #4 (11.17.2025) - pt wishes to proceed with laser - RBA of procedure discussed, questions answered - informed consent obtained and signed - see procedure note - f/u 4 weeks -- DFE/OCT, possible injxn  5,6. Hypertensive retinopathy OU - discussed importance of tight BP control - monitor  7. Mixed Cataract OU - The symptoms of cataract, surgical options, and treatments and risks were discussed with patient. - discussed diagnosis and progression - monitor  Ophthalmic Meds Ordered this visit:  No orders of the defined types were placed in this encounter.    No follow-ups on file.  There are no Patient Instructions on file for this visit.  Explained the diagnoses, plan, and follow up with the patient and they expressed understanding.  Patient expressed understanding of the importance of proper follow up care.   This document serves as a record of services personally performed by Redell JUDITHANN Hans, MD, PhD. It was created on their behalf by Avelina Pereyra, COA an ophthalmic technician. The creation of this record is the provider's dictation and/or activities during the visit.   Electronically signed by: Avelina GORMAN Pereyra, COT  10/26/24  8:45 AM   This document serves as a record of services personally performed by Redell JUDITHANN Hans, MD, PhD.  It was created on their behalf by Wanda GEANNIE Keens, COT an ophthalmic technician. The creation of this record is the provider's dictation and/or activities during the visit.    Electronically signed by:  Wanda GEANNIE Keens, COT  10/30/24 7:29 AM   Redell JUDITHANN Hans,  M.D., Ph.D. Diseases & Surgery of the Retina and Vitreous [facility] 10/30/2024    Abbreviations: M myopia (nearsighted); A astigmatism; H hyperopia (farsighted); P presbyopia; Mrx spectacle prescription;  CTL contact lenses; OD right eye; OS left eye; OU both eyes  XT exotropia; ET esotropia; PEK punctate epithelial keratitis; PEE punctate epithelial erosions; DES dry eye syndrome; MGD meibomian gland dysfunction; ATs artificial tears; PFAT's preservative free artificial tears; NSC nuclear sclerotic cataract; PSC posterior subcapsular cataract; ERM epi-retinal membrane; PVD posterior vitreous detachment; RD retinal detachment; DM diabetes mellitus; DR diabetic retinopathy; NPDR non-proliferative diabetic retinopathy; PDR proliferative diabetic retinopathy; CSME clinically significant macular edema; DME diabetic macular edema; dbh dot blot hemorrhages; CWS cotton wool spot; POAG primary open angle glaucoma; C/D cup-to-disc ratio; HVF humphrey visual field; GVF goldmann visual field; OCT optical coherence tomography; IOP intraocular pressure; BRVO Branch retinal vein occlusion; CRVO central retinal vein occlusion; CRAO central retinal artery occlusion; BRAO branch retinal artery occlusion; RT retinal tear; SB scleral buckle; PPV pars plana vitrectomy; VH Vitreous hemorrhage; PRP panretinal laser photocoagulation; IVK intravitreal kenalog ; VMT vitreomacular traction; MH Macular hole;  NVD neovascularization of the disc; NVE neovascularization elsewhere; AREDS age related eye disease study; ARMD age related macular degeneration; POAG primary open angle glaucoma; EBMD epithelial/anterior basement membrane dystrophy;  ACIOL anterior chamber intraocular lens; IOL intraocular lens; PCIOL posterior chamber intraocular lens; Phaco/IOL phacoemulsification with intraocular lens placement; PRK photorefractive keratectomy; LASIK laser assisted in situ keratomileusis; HTN hypertension; DM diabetes mellitus; COPD chronic obstructive pulmonary disease

## 2024-10-30 ENCOUNTER — Encounter (INDEPENDENT_AMBULATORY_CARE_PROVIDER_SITE_OTHER): Admitting: Ophthalmology

## 2024-10-30 DIAGNOSIS — Z7984 Long term (current) use of oral hypoglycemic drugs: Secondary | ICD-10-CM

## 2024-10-30 DIAGNOSIS — E113513 Type 2 diabetes mellitus with proliferative diabetic retinopathy with macular edema, bilateral: Secondary | ICD-10-CM

## 2024-10-30 DIAGNOSIS — I10 Essential (primary) hypertension: Secondary | ICD-10-CM

## 2024-10-30 DIAGNOSIS — H35033 Hypertensive retinopathy, bilateral: Secondary | ICD-10-CM

## 2024-10-30 DIAGNOSIS — H4311 Vitreous hemorrhage, right eye: Secondary | ICD-10-CM

## 2024-10-30 DIAGNOSIS — Z794 Long term (current) use of insulin: Secondary | ICD-10-CM

## 2024-10-30 DIAGNOSIS — H25813 Combined forms of age-related cataract, bilateral: Secondary | ICD-10-CM

## 2024-10-30 NOTE — Patient Instructions (Incomplete)
 Preventive Care 55-55 Years Old, Female 53 Preventive care refers to lifestyle choices and visits with your health care provider that can promote health and wellness. Preventive care visits are also called wellness exams.  What can I expect for my preventive care visit?  Counseling  Your health care provider may ask you questions about your:  Medical history, including:  Past medical problems.  Family medical history.  Pregnancy history.  Current health, including:  Menstrual cycle.  Method of birth control.  Emotional well-being.  Home life and relationship well-being.  Sexual activity and sexual health.  Lifestyle, including:  Alcohol, nicotine or tobacco, and drug use.  Access to firearms.  Diet, exercise, and sleep habits.  Work and work Astronomer.  Sunscreen use.  Safety issues such as seatbelt and bike helmet use.  Physical exam  Your health care provider will check your:  Height and weight. These may be used to calculate your BMI (body mass index). BMI is a measurement that tells if you are at a healthy weight.  Waist circumference. This measures the distance around your waistline. This measurement also tells if you are at a healthy weight and may help predict your risk of certain diseases, such as type 2 diabetes and high blood pressure.  Heart rate and blood pressure.  Body temperature.  Skin for abnormal spots.  What immunizations do I need?    Vaccines are usually given at various ages, according to a schedule. Your health care provider will recommend vaccines for you based on your age, medical history, and lifestyle or other factors, such as travel or where you work.  What tests do I need?  Screening  Your health care provider may recommend screening tests for certain conditions. This may include:  Lipid and cholesterol levels.  Diabetes screening. This is done by checking your blood sugar (glucose) after you have not eaten for a while (fasting).  Pelvic exam and Pap test.  Hepatitis B test.  Hepatitis C  test.  HIV (human immunodeficiency virus) test.  STI (sexually transmitted infection) testing, if you are at risk.  Lung cancer screening.  Colorectal cancer screening.  Mammogram. Talk with your health care provider about when you should start having regular mammograms. This may depend on whether you have a family history of breast cancer.  BRCA-related cancer screening. This may be done if you have a family history of breast, ovarian, tubal, or peritoneal cancers.  Bone density scan. This is done to screen for osteoporosis.  Talk with your health care provider about your test results, treatment options, and if necessary, the need for more tests.  Follow these instructions at home:  Eating and drinking    Eat a diet that includes fresh fruits and vegetables, whole grains, lean protein, and low-fat dairy products.  Take vitamin and mineral supplements as recommended by your health care provider.  Do not drink alcohol if:  Your health care provider tells you not to drink.  You are pregnant, may be pregnant, or are planning to become pregnant.  If you drink alcohol:  Limit how much you have to 0-1 drink a day.  Know how much alcohol is in your drink. In the [place] one drink equals one 12 oz bottle of beer (355 mL), one 5 oz glass of wine (148 mL), or one 1 oz glass of hard liquor (44 mL).  Lifestyle  Brush your teeth every morning and night with fluoride toothpaste. Floss one time each day.  Exercise for at least  30 minutes 5 or more days each week.  Do not use any products that contain nicotine or tobacco. These products include cigarettes, chewing tobacco, and vaping devices, such as e-cigarettes. If you need help quitting, ask your health care provider.  Do not use drugs.  If you are sexually active, practice safe sex. Use a condom or other form of protection to prevent STIs.  If you do not wish to become pregnant, use a form of birth control. If you plan to become pregnant, see your health care provider for a  prepregnancy visit.  Take aspirin only as told by your health care provider. Make sure that you understand how much to take and what form to take. Work with your health care provider to find out whether it is safe and beneficial for you to take aspirin daily.  Find healthy ways to manage stress, such as:  Meditation, yoga, or listening to music.  Journaling.  Talking to a trusted person.  Spending time with friends and family.  Minimize exposure to UV radiation to reduce your risk of skin cancer.  Safety  Always wear your seat belt while driving or riding in a vehicle.  Do not drive:  If you have been drinking alcohol. Do not ride with someone who has been drinking.  When you are tired or distracted.  While texting.  If you have been using any mind-altering substances or drugs.  Wear a helmet and other protective equipment during sports activities.  If you have firearms in your house, make sure you follow all gun safety procedures.  Seek help if you have been physically or sexually abused.  What's next?  Visit your health care provider once a year for an annual wellness visit.  Ask your health care provider how often you should have your eyes and teeth checked.  Stay up to date on all vaccines.  This information is not intended to replace advice given to you by your health care provider. Make sure you discuss any questions you have with your health care provider.  Document Revised: 05/28/2021 Document Reviewed: 05/28/2021  Elsevier Patient Education  2024 [employer].

## 2024-10-31 ENCOUNTER — Encounter: Admitting: Family Medicine

## 2024-10-31 ENCOUNTER — Ambulatory Visit

## 2024-10-31 ENCOUNTER — Telehealth: Payer: Self-pay

## 2024-10-31 NOTE — Progress Notes (Deleted)
 S:     Reason for visit: ?  Sarah Duffy is a 53 y.o. female with a history of diabetes (type 2), who presents today for a follow up diabetes Telephone pharmacotherapy visit.? Pertinent PMH also includes migraines, HFimpEF, CAD, HLD, CKD stage 3a.  Care Team: Primary Care Provider: [name], Krichna, MD  At last visit with endocrinology on 08/10/24, patient was started on Mounjaro  7.5 mg weekly in place of Ozempic . Basaglar  was decreased to 60 units d/t overnight hypoglycemia and Humalog was increased to 15 units TID. Discussed switching patient to [facility] after discharge from SNF at that time.   Today, patient reports she did not adjust insulin  doses after last endocrinology appointment. She does report some cost barriers at this time. She reports daily overnight hypoglycemia during which she will treat with orange juice.  Current diabetes medications include: Novolog  10 units TID, Basaglar  70 units nightly, Jardiance  25 mg daily,  Mounjaro  7.5 mg weekly Previous diabetes medications include: Ozempic , metformin  Current hypertension medications include: amlodipine  5 mg daily, carvedilol  12.5 mg BID, spironolactone  25 mg daily, Entresto  24/26 mg BID Current hyperlipidemia medications include: atorvastatin  80 mg daily, ezetimibe  10 mg daily  Patient reports adherence to taking all medications, however, has continued previous insulin  doses.   Have you been experiencing any side effects to the medications prescribed? no Do you have any problems obtaining medications due to transportation or finances? Yes - difficulty with Medicaid copays Insurance coverage: [place] [employer]  Patient reports overnight hypoglycemic events every night  DM Prevention:  Statin: Taking; high intensity.?  ACE/ARB: yes; Entresto  History of chronic kidney disease? yes Last urinary albumin/creatinine ratio:  Lab Results  Component Value Date   MICRALBCREAT 11.7 03/13/2024   MICRALBCREAT 67 (H) 03/09/2023    MICRALBCREAT 258 (H) 05/01/2022   MICRALBCREAT 3,924 (H) 02/11/2021   MICRALBCREAT 467 (H) 09/25/2019   MICRALBCREAT 345 (H) 06/23/2019   Last eye exam:  Lab Results  Component Value Date   HMDIABEYEEXA Retinopathy (A) 01/12/2023   Lab Results  Component Value Date   HMDIABEYEEXA Retinopathy (A) 01/12/2023   Last foot exam: 06/21/2023 Tobacco Use:  Tobacco Use: Low Risk (10/11/2024)   Received from [facility]   Patient History    Smoking Tobacco Use: Never    Smokeless Tobacco Use: Never    Passive Exposure: Past   O:  Dexcom Clarity Report     Vitals:  Wt Readings from Last 3 Encounters:  10/03/24 190 lb (86.2 kg)  09/11/24 190 lb (86.2 kg)  09/08/24 190 lb (86.2 kg)   BP Readings from Last 3 Encounters:  10/03/24 120/72  09/11/24 106/62  08/28/24 106/62   Pulse Readings from Last 3 Encounters:  08/28/24 82  07/27/24 76  07/05/24 92     Labs:?  Lab Results  Component Value Date   HGBA1C 10.4 (H) 07/21/2024   HGBA1C 9.3 05/04/2024   HGBA1C 10.6 01/07/2024   GLUCOSE 94 07/25/2024   MICRALBCREAT 11.7 03/13/2024   MICRALBCREAT 67 (H) 03/09/2023   MICRALBCREAT 258 (H) 05/01/2022   CREATININE 1.13 (H) 07/25/2024   CREATININE 1.19 (H) 07/21/2024   CREATININE 1.28 (H) 07/20/2024    Lab Results  Component Value Date   CHOL 138 03/09/2023   LDLCALC 79 03/09/2023   LDLCALC 95 05/01/2022   LDLCALC 115 (H) 08/04/2021   LDLDIRECT 72 11/02/2023   HDL 45 (L) 03/09/2023   TRIG 64 03/09/2023   TRIG 99 05/01/2022   TRIG 89 08/04/2021   ALT  15 12/10/2023   ALT 18 03/09/2023   AST 19 12/10/2023   AST 15 03/09/2023      Chemistry      Component Value Date/Time   NA 136 07/25/2024 0416   NA 138 11/02/2023 0953   K 4.8 07/25/2024 0416   CL 108 07/25/2024 0416   CO2 24 07/25/2024 0416   BUN 50 (H) 07/25/2024 0416   BUN 23 11/02/2023 0953   CREATININE 1.13 (H) 07/25/2024 0416   CREATININE 1.12 (H) 12/10/2023 1406      Component Value Date/Time    CALCIUM  9.3 07/25/2024 0416   ALKPHOS 61 09/17/2021 1415   AST 19 12/10/2023 1406   ALT 15 12/10/2023 1406   BILITOT 0.5 12/10/2023 1406   BILITOT 0.4 02/28/2016 0951       The 10-year ASCVD risk score (Arnett DK, et al., 2019) is: 3.5%  Lab Results  Component Value Date   MICRALBCREAT 11.7 03/13/2024   MICRALBCREAT 67 (H) 03/09/2023   MICRALBCREAT 258 (H) 05/01/2022   MICRALBCREAT 3,924 (H) 02/11/2021   MICRALBCREAT 467 (H) 09/25/2019   MICRALBCREAT 345 (H) 06/23/2019    A/P: Diabetes currently uncontrolled with a most recent A1c of 10.4% on 07/21/24, however, GMI is controlled at 6.8%. Patient is able to verbalize appropriate hypoglycemia management plan. Medication adherence appears appropriate, however, patient is experiencing overnight hypoglycemia on a nightly basis. Did not decrease basal insulin  as instructed previously by endocrinology. Will decrease dose at this time. Additionally, patient reports some cost concerns with medications. Discussed options of utilizing a pharmacy that offers a charge account, where she can pick up her medications and pay later on as she is able.  -Decreased dose of basal insulin  Basaglar  (insulin  glargine)  from 70 to 60 units nightly as instructed by Endocrinology. -Continued rapid insulin  Novolog  (insulin  aspart) 10 units TID.  -Continued GLP-1 Mounjaro  (tirzepatide )  7.5 mg weekly -Continued SGLT2-I Jardiance  (empagliflozin ) 25 mg daily.  -Extensively discussed pathophysiology of diabetes, recommended lifestyle interventions, dietary effects on blood sugar control.  -Counseled on s/sx of and management of hypoglycemia.  -Next A1c anticipated 10/2024.  -Continue monitoring BG via Dexcom G7 -Discuss utilizing a charge account at [facility] pharmacy at follow up with PCP.  -Reschedule follow up with endocrinology  ASCVD risk - secondary prevention in patient with diabetes. Last LDL is 72 mg/dL, not at goal of <29 mg/dL.  -Continued  atorvastatin  80 mg daily.  -Continued ezetimibe  10 mg daily   Patient verbalized understanding of treatment plan. Total time patient counseling 45 minutes.  Follow-up:  Pharmacist on 10/31/24 PCP clinic visit on 10/31/24  Peyton CHARLENA Ferries, PharmD, CPP Clinical Pharmacist [facility] [phone]

## 2024-10-31 NOTE — Patient Instructions (Signed)
 Bobie Diesing - I am sorry I was unable to reach you today for our scheduled appointment. I work with [name], Krichna, MD and am calling to support your healthcare needs. Please contact me at [phone] at your earliest convenience. I look forward to speaking with you soon.   Thank you,  [name], MSN, RN [facility]  [facility], [facility] RN Care Manager Direct Dial : [phone] Fax: [phone]

## 2024-11-01 ENCOUNTER — Other Ambulatory Visit: Payer: Self-pay | Admitting: Licensed Clinical Social Worker

## 2024-11-01 ENCOUNTER — Telehealth: Payer: Self-pay | Admitting: Licensed Clinical Social Worker

## 2024-11-01 ENCOUNTER — Encounter: Payer: Self-pay | Admitting: Licensed Clinical Social Worker

## 2024-11-01 NOTE — Patient Instructions (Signed)
 Shinika Mesa - I am sorry I was unable to reach you today for our scheduled appointment. I work with [name], Krichna, MD and am calling to support your healthcare needs. Please contact me at [phone] at your earliest convenience. I look forward to speaking with you soon.   Thank you,  Cena Ligas, LCSW Clinical Social Worker [facility]

## 2024-11-01 NOTE — Patient Outreach (Signed)
 LCSW received call from patient. Patient stated she fell and has been in a lot of pain today. Patient stated she is going to call her doctor or go to the emergency room. LCSW reminded patient of next appointment and also encouraged her to reach back out to [facility]. Patient stated she will return her call. LCSW encouraged patient to reach back out if anything comes back up before next appointment.  Cena Ligas, LCSW Clinical Social Worker [facility]

## 2024-11-02 ENCOUNTER — Telehealth: Payer: Self-pay

## 2024-11-02 NOTE — Patient Outreach (Signed)
 RNCM received message from patient requesting call back to reschedule missed appointment. Returned call earlier today and left message stated will schedule for 1pm today due to cancellation - if cannot make visit to call me back to reschedule. Called at 1pm, unable to leave new message.

## 2024-11-03 ENCOUNTER — Ambulatory Visit: Attending: Cardiology | Admitting: Cardiology

## 2024-11-03 NOTE — Progress Notes (Deleted)
 Cardiology Office Note   Date:  11/03/2024  ID:  Sarah Duffy, DOB 1970/12/20, MRN [id number] PCP: Glenard Mire, MD  [facility] Providers Cardiologist:  Redell Cave, MD { Click to update primary MD,subspecialty MD or APP then REFRESH:1}    History of Present Illness Sarah Duffy is a 53 y.o. female with past medical history of coronary artery disease, chronic heart failure with improved EF, ischemic cardiomyopathy, hypertension, hyperlipidemia Type 2 diabetes, CKD stage III, MRSA/osteomyelitis of the foot (2017), who is here today for follow-up.   She previously been evaluated in March with transient chest discomfort concerning the following death of her mother.  Echocardiogram at that time revealed an LVEF of 35-40% with global hypokinesis, G1 DD.  Diagnostic catheterization was performed and showed severe multivessel CAD.  She was referred to CT surgery in September 2022 with recommendation for CABG.  It was initially scheduled for October 2022 but was subsequently canceled secondary to the start of an illness.  Echocardiogram in July 2023 showed an EF of 50-55%, G2 DD, mild MR.  She was referred back to CT surgery in August 2023 and deferred proceeding with CABG due to change in her medical insurance.  She was seen in clinic 03/03/2023 at that time she states she was doing well.  Amlodipine  was discontinued with plan to titrate Entresto  in the future.  She had recently been diagnosed with flu and had recovered.  She had not made a follow-up appointment with CT surgery because of ongoing issues with insurance.  There were no medication changes that were made and she was scheduled for labs and encouraged to make her follow-up with [facility].  She did follow-up with Dr. Sherrine on 08/25/2023.  At that time she had overall continued to feel well.  She continued denying chest pain or pressure.  She recently followed up with her PCP and was noted to have an elevated glucose and a hemoglobin  A1c was elevated 8.4.  She was scheduled to see her endocrinologist in the near future and advised to have that taken care of.  She was to follow back up with [facility] for coronary artery bypass grafting after she had seen her endocrinologist and her dentist about a tooth.  She was previously seen in clinic 11/01/2023 overall doing well from a cardiac perspective.  She had several CT scans completed and was going through physical therapy and awaiting MRI of her back.  She had follow-up with Dr. Sherrine who advised her to have the issues of her back taking care of before undergoing open heart surgery.  She was continued on her current medication regimen without changes.  She followed up with [facility] cardiology to establish care on 03/01/2024.  She had previously been diagnosed with HFrEF in 2022 and coronary artery disease.  She continued not to have any overt angina or heart failure symptoms.  She been started on goal-directed medical therapy send most recent LV function was 50-55%.  LDL was found to be 72 with goal of less than 70 and she was recommended to start on ezetimibe  10.  They discontinued her amlodipine  due to some lightheadedness and dizziness while at work.  She was recommended to check her blood pressure daily and she was scheduled for an echocardiogram.  She was then scheduled a 72-month return appointment.   She presented to [facility] cardiology with Dr. Lera on 03/22/24 as a new patient to establish care.  She was scheduled for an echocardiogram for reassessment of her LVEF.  Continued on amlodipine  and Entresto  as well as Jardiance , carvedilol , furosemide .  Continued on her lipid management.  She was scheduled for an exercise Myoview  for further evaluation of ischemia.  She was referred to neurosurgery for potentially the need for an MRI for her lower back pain.  She was to return for follow-up in 1 month.  She was seen in clinic 04/13/2024 stating with a cardiac perspective she was doing well.  She  continued to battle with back issues.  She had been evaluated by physiatry.  Recently had new MRI completed and was waiting for next [employer] treatment for her back as they were worried about spinal stenosis and a variation of things.  She had followed up with a neurologist because there was concern that she had had a stroke.  CT of the head was negative.  She continued to wait hear back from neurosurgery and peers providers about her back issues.  There were no medication changes that were made or further testing that was ordered at that time.  She was then evaluated by Dr. Gollan AKC on 04/25/2024.  Continue to have complaints of back pain.  There were no medication changes that were made or further testing that was ordered.   She returns clinic today  ROS: 10 point review of system has been reviewed and considered negative exception was been listed in the HPI  Studies Reviewed      Lexiscan  MPI ([facility]) results pending 2D echo ([facility]) 04/06/2024 CONCLUSION -------------------------------------------------------------------------------  MILD LEFT VENTRICULAR SYSTOLIC DYSFUNCTION WITH NO LVH  ESTIMATED EF: 50%, CALC EF(2D): 49%  NORMAL LA PRESSURES WITH DIASTOLIC DYSFUNCTION (GRADE 1)  NORMAL RIGHT VENTRICULAR SYSTOLIC FUNCTION  VALVULAR REGURGITATION: No AR, MILD MR, MILD PR, MILD TR  NO VALVULAR STENOSIS    2D echo [facility]) 03/01/2024 Summary   1. The left ventricle is normal in size with normal wall thickness.    2. The left ventricular systolic function is overall normal, LVEF is  visually estimated at 55-60%.    3. There is decreased contractile function involving the mid inferolateral  segment(s).   4. The left atrium is mildly dilated in size.    5. The right ventricle is normal in size, with normal systolic function.    TTE 06/23/22 1. Left ventricular ejection fraction, by estimation, is 50 to 55%. Left  ventricular ejection fraction by 3D volume is 52 %. Left ventricular  ejection  fraction by PLAX is 53 %. The left ventricle has low normal  function. The left ventricle has no  regional wall motion abnormalities. Left ventricular diastolic parameters  are consistent with Grade II diastolic dysfunction (pseudonormalization).  The average left ventricular global longitudinal strain is -18.9 %.   2. Right ventricular systolic function is normal. The right ventricular  size is normal. There is normal pulmonary artery systolic pressure. The  estimated right ventricular systolic pressure is 21.0 mmHg.   3. Left atrial size was mildly dilated.   4. The mitral valve is normal in structure. Mild mitral valve  regurgitation. No evidence of mitral stenosis.   5. The aortic valve is normal in structure. Aortic valve regurgitation is  not visualized. Aortic valve sclerosis is present, with no evidence of  aortic valve stenosis.   6. The inferior vena cava is normal in size with greater than 50%  respiratory variability, suggesting right atrial pressure of 3 mmHg.    R/LHC 04/14/21 Conclusions: Multivessel coronary artery disease including mild to moderate diffuse plaquing of the LAD  and RCA.  The mid/distal LAD demonstrates 3 focal lesions of up to 80% that are hemodynamically significant by iFR (iFR = 0.71).  There is also a 99% stenosis of OM2 with faint left to left collaterals supplying the distal branch as well as sequential 60% distal RCA and 70-80% proximal RPDA lesions. Upper normal to mildly elevated left heart filling pressure. Normal right heart and pulmonary artery pressures. Normal cardiac output/index.   Recommendations: In the setting of multivessel coronary artery disease, diabetes mellitus, and reduced LVEF that appears to be due to ischemic cardiomyopathy, consultation with cardiac surgery for consideration of CABG is recommended. Initiate aspirin  81 mg daily. Increase carvedilol  to 12.5 mg twice daily with further escalation of goal-directed medical therapy for  chronic HFrEF as tolerated. Aggressive secondary prevention of coronary artery disease.   TTE 03/12/21 1. Left ventricular ejection fraction, by estimation, is 35 to 40%. The  left ventricle has moderately decreased function. The left ventricle  demonstrates global hypokinesis. Left ventricular diastolic parameters are  consistent with Grade I diastolic  dysfunction (impaired relaxation). The average left ventricular global  longitudinal strain is -13.5 %. The global longitudinal strain is  abnormal.   2. Right ventricular systolic function is normal. The right ventricular  size is normal.   3. Left atrial size was mildly dilated.   4. The mitral valve is normal in structure. Moderate mitral valve  regurgitation.   Risk Assessment/Calculations   No BP recorded.  {Refresh Note OR Click here to enter BP  :1}***       Physical Exam VS:  LMP  (LMP Unknown)        Wt Readings from Last 3 Encounters:  10/03/24 190 lb (86.2 kg)  09/11/24 190 lb (86.2 kg)  09/08/24 190 lb (86.2 kg)    GEN: Well nourished, well developed in no acute distress NECK: No JVD; No carotid bruits CARDIAC: ***RRR, no murmurs, rubs, gallops RESPIRATORY:  Clear to auscultation without rales, wheezing or rhonchi  ABDOMEN: Soft, non-tender, non-distended EXTREMITIES:  No edema; No deformity   ASSESSMENT AND PLAN Coronary artery disease status post diagnostic catheterization in May 2022 with severe diffuse multivessel coronary artery disease. HFimpEF/ischemic cardiomyopathy Primary hypertension Mixed hyperlipidemia mitral regurgitation CKD stage IIIa Type 2 diabetes Chronic back pain    {Are you ordering a CV Procedure (e.g. stress test, cath, DCCV, TEE, etc)?   Press F2        :[id number]}  Dispo: ***  Signed, Jermika Olden, NP

## 2024-11-06 ENCOUNTER — Encounter: Payer: Self-pay | Admitting: Cardiology

## 2024-11-07 DIAGNOSIS — G4733 Obstructive sleep apnea (adult) (pediatric): Secondary | ICD-10-CM | POA: Diagnosis not present

## 2024-11-13 ENCOUNTER — Encounter: Payer: Self-pay | Admitting: Licensed Clinical Social Worker

## 2024-11-13 ENCOUNTER — Telehealth: Payer: Self-pay | Admitting: Licensed Clinical Social Worker

## 2024-11-14 ENCOUNTER — Other Ambulatory Visit: Payer: Self-pay | Admitting: Family Medicine

## 2024-11-14 ENCOUNTER — Ambulatory Visit: Admitting: Family Medicine

## 2024-11-14 ENCOUNTER — Encounter: Payer: Self-pay | Admitting: Family Medicine

## 2024-11-14 ENCOUNTER — Telehealth: Admitting: Family Medicine

## 2024-11-14 DIAGNOSIS — G959 Disease of spinal cord, unspecified: Secondary | ICD-10-CM

## 2024-11-14 DIAGNOSIS — I2510 Atherosclerotic heart disease of native coronary artery without angina pectoris: Secondary | ICD-10-CM

## 2024-11-14 DIAGNOSIS — E1159 Type 2 diabetes mellitus with other circulatory complications: Secondary | ICD-10-CM

## 2024-11-14 DIAGNOSIS — I5022 Chronic systolic (congestive) heart failure: Secondary | ICD-10-CM | POA: Diagnosis not present

## 2024-11-14 DIAGNOSIS — M47816 Spondylosis without myelopathy or radiculopathy, lumbar region: Secondary | ICD-10-CM | POA: Diagnosis not present

## 2024-11-14 DIAGNOSIS — R296 Repeated falls: Secondary | ICD-10-CM | POA: Diagnosis not present

## 2024-11-14 DIAGNOSIS — R269 Unspecified abnormalities of gait and mobility: Secondary | ICD-10-CM | POA: Diagnosis not present

## 2024-11-14 DIAGNOSIS — G9520 Unspecified cord compression: Secondary | ICD-10-CM | POA: Diagnosis not present

## 2024-11-14 DIAGNOSIS — N1831 Chronic kidney disease, stage 3a: Secondary | ICD-10-CM

## 2024-11-14 DIAGNOSIS — G894 Chronic pain syndrome: Secondary | ICD-10-CM | POA: Diagnosis not present

## 2024-11-14 DIAGNOSIS — E1165 Type 2 diabetes mellitus with hyperglycemia: Secondary | ICD-10-CM | POA: Diagnosis not present

## 2024-11-14 DIAGNOSIS — E785 Hyperlipidemia, unspecified: Secondary | ICD-10-CM

## 2024-11-14 MED ORDER — ATORVASTATIN CALCIUM 80 MG PO TABS: 80.0000 mg | ORAL_TABLET | Freq: Every day | ORAL | 1 refills | Status: AC

## 2024-11-14 MED ORDER — EZETIMIBE 10 MG PO TABS: 10.0000 mg | ORAL_TABLET | Freq: Every day | ORAL | 1 refills | Status: AC

## 2024-11-14 MED ORDER — AMLODIPINE BESYLATE 5 MG PO TABS: 5.0000 mg | ORAL_TABLET | Freq: Every day | ORAL | 1 refills | Status: DC

## 2024-11-14 NOTE — Progress Notes (Signed)
 Name: Sarah Duffy   MRN: [id number]    DOB: [birth year]   Date:11/14/2024       Progress Note  Subjective  Chief Complaint  Chief Complaint  Patient presents with   Medical Management of Chronic Issues    I connected with  Sarah Duffy  on 11/14/24 at 10:20 AM EST by a video enabled telemedicine application and verified that I am speaking with the correct person using two identifiers.  I discussed the limitations of evaluation and management by telemedicine and the availability of in person appointments. The patient expressed understanding and agreed to proceed with the virtual visit  Staff also discussed with the patient that there may be a patient responsible charge related to this service. Patient Location: at home  Provider Location: [facility] Additional Individuals present: alone   Discussed the use of AI scribe software for clinical note transcription with the patient, who gave verbal consent to proceed.  History of Present Illness Sarah Duffy is a 53 year old female with cervical myelopathy and type 2 diabetes who presents with worsening pain and mobility issues.  She experiences worsening pain and mobility issues, having fallen twice in October. Pain is present in her back and neck, now radiating to the middle of her back, and she is unable to lift anything over her head. Significant pain is also noted in her right hip, with a pain level rated as ten. She requires assistance from personal care assistants (PCAs) for sitting and standing, as they are unable to lift her. Most of her time is spent sitting in high chairs or stools.  She has successfully reduced her blood sugar levels to averages between 70 and 90 before Thanksgiving, although they spiked to 270-290 during the holidays. Her sugar levels are now back on track. She is currently taking Novolog , BasaglarMounjaro 7.5, and Jardiance .  She experiences shortness of breath and lightheadedness, particularly in the mornings when  moving from her bedroom to the living room. She did not seek medical attention after her falls and is concerned about potential injuries. She also reports a sensation of difficulty swallowing liquids, feeling as though there might be a 'hole in her throat'.  She has chronic systolic heart failure and is experiencing shortness of breath but no chest tightness. Her medications include amlodipine , atorvastatin , ezetimibe , spironolactone , Entresto , and carvedilol .  She has difficulty with mobility due to her home environment, specifically with the height of her commode, and has requested a referral for a bedside commode. She is unable to attend appointments easily due to lack of transportation and family availability during working hours.   She has cervical myelopathy and is waiting for glucose to be at goal to have surgery done by Dr. Penne Sharps  Patient Active Problem List   Diagnosis Date Noted   Chronic combined systolic and diastolic CHF (congestive heart failure) (HCC) 07/20/2024   Fall at home, initial encounter 07/20/2024   Type II diabetes mellitus with renal manifestations (HCC) 07/20/2024   Overweight (BMI 25.0-29.9) 07/20/2024   Cervical myelopathy (HCC) 07/20/2024   Impaired gait and mobility 07/20/2024   Lumbar spinal stenosis 07/19/2024   Chronic pain syndrome 07/05/2024   Dyslipidemia due to type 2 diabetes mellitus (HCC) 07/05/2024   Spondylosis, cervical, with myelopathy 04/18/2024   Hip weakness 04/03/2024   Gait instability 04/03/2024   Chronic anemia 03/14/2024   Gait abnormality 11/30/2023   Spinal stenosis of lumbar region 11/30/2023   Chronic kidney disease, stage 3a (HCC) 11/09/2023   Weakness  of extremity 09/20/2023   Arthropathy of lumbar facet joint 07/29/2023   Low back pain 07/29/2023   Pain in finger of left hand 07/29/2023   Sacroiliac joint pain 07/29/2023   Contact dermatitis and eczema 06/08/2023   Dysphagia 03/23/2023   Asthma, well controlled  03/09/2023   Depression, major, in remission 03/09/2023   Perennial allergic rhinitis with seasonal variation 03/09/2023   Type 2 diabetes mellitus with diabetic polyneuropathy, with long-term current use of insulin  (HCC) 08/31/2022   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(HCC) 08/31/2022   Hypertension 06/11/2022   Ischemic cardiomyopathy 06/11/2022   Coronary artery disease 06/11/2022   Chronic systolic heart failure (HCC) 06/11/2022   Angina pectoris associated with type 2 diabetes mellitus (HCC) 01/05/2022   Cardiomyopathy (HCC) 04/14/2021   History of 2019 novel coronavirus disease (COVID-19) 06/23/2019   Chronic migraine w/o aura, not intractable, w/o stat migr 01/14/2018   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 associated with type 2 diabetes mellitus (HCC) 05/21/2016   Hyperlipidemia LDL goal <70 03/10/2016   MI (mitral incompetence) 02/25/2016   Insomnia 02/25/2016   Anxiety 09/04/2014    Past Surgical History:  Procedure Laterality Date   CHOLECYSTECTOMY  1999   COLONOSCOPY WITH PROPOFOL  N/A 03/25/2021   Procedure: COLONOSCOPY WITH PROPOFOL ;  Surgeon: Therisa Bi, MD;  Location: [facility] ENDOSCOPY;  Service: Gastroenterology;  Laterality: N/A;   COLONOSCOPY WITH PROPOFOL  N/A 04/17/2021   Procedure: COLONOSCOPY WITH PROPOFOL ;  Surgeon: Therisa Bi, MD;  Location: [facility] ENDOSCOPY;  Service: Gastroenterology;  Laterality: N/A;   ESOPHAGOGASTRODUODENOSCOPY (EGD) WITH PROPOFOL  N/A 03/25/2021   Procedure: ESOPHAGOGASTRODUODENOSCOPY (EGD) WITH PROPOFOL ;  Surgeon: Therisa Bi, MD;  Location: [facility] ENDOSCOPY;  Service: Gastroenterology;  Laterality: N/A;   ESOPHAGOGASTRODUODENOSCOPY (EGD) WITH PROPOFOL  N/A 04/17/2021   Procedure: ESOPHAGOGASTRODUODENOSCOPY (EGD) WITH PROPOFOL ;  Surgeon: Therisa Bi, MD;  Location: [facility] ENDOSCOPY;  Service: Gastroenterology;  Laterality: N/A;   ESOPHAGOGASTRODUODENOSCOPY (EGD) WITH PROPOFOL  N/A 03/23/2023   Procedure:  ESOPHAGOGASTRODUODENOSCOPY (EGD) WITH PROPOFOL ;  Surgeon: Therisa Bi, MD;  Location: [facility] ENDOSCOPY;  Service: Gastroenterology;  Laterality: N/A;   RIGHT/LEFT HEART CATH AND CORONARY ANGIOGRAPHY Bilateral 04/14/2021   Procedure: RIGHT/LEFT HEART CATH AND CORONARY ANGIOGRAPHY;  Surgeon: Mady Bruckner, MD;  Location: [facility] INVASIVE CV LAB;  Service: Cardiovascular;  Laterality: Bilateral;   TOE SURGERY Left 02/07/2016   Pinky Toe    Family History  Problem Relation Age of Onset   Diabetes Mother    Ulcers Mother    Heart disease Father    AAA (abdominal aortic aneurysm) Father    Diabetes Father    Hypertension Father    Stroke Father    Alzheimer's disease Father    Heart attack Sister    Seizures Brother    Diabetes Maternal Grandmother    Breast cancer Maternal Grandmother     Social History   Socioeconomic History   Marital status: Single    Spouse name: Not on file   Number of children: 1   Years of education: Not on file   Highest education level: Some college, no degree  Occupational History   Occupation: dealer   Tobacco Use   Smoking status: Never   Smokeless tobacco: Never  Vaping Use   Vaping status: Never Used  Substance and Sexual Activity   Alcohol use: Never   Drug use: Never   Sexual activity: Yes    Partners: Male    Birth control/protection: Condom, Injection  Other Topics Concern   Not on file  Social History Narrative   She used to work at [employer], but  Feb 2017 she left work because of  MRSA infection.osteomyelitis and uncontrolled DM. She has been back to work since Feb 2018   Lives alone   Social Drivers of Health   Financial Resource Strain: High Risk (08/25/2024)   Overall Financial Resource Strain (CARDIA)    Difficulty of Paying Living Expenses: Very hard  Food Insecurity: No Food Insecurity (10/03/2024)   Hunger Vital Sign    Worried About Running Out of Food in the Last Year: Never true    Ran Out of Food in the  Last Year: Never true  Recent Concern: Food Insecurity - Food Insecurity Present (08/25/2024)   Hunger Vital Sign    Worried About Running Out of Food in the Last Year: Sometimes true    Ran Out of Food in the Last Year: Sometimes true  Transportation Needs: No Transportation Needs (08/25/2024)   PRAPARE - Administrator, Civil Service (Medical): No    Lack of Transportation (Non-Medical): No  Physical Activity: Inactive (08/25/2024)   Exercise Vital Sign    Days of Exercise per Week: 0 days    Minutes of Exercise per Session: 0 min  Stress: Stress Concern Present (08/25/2024)   [employer] of Occupational Health - Occupational Stress Questionnaire    Feeling of Stress: To some extent  Social Connections: Socially Isolated (08/25/2024)   Social Connection and Isolation Panel    Frequency of Communication with Friends and Family: More than three times a week    Frequency of Social Gatherings with Friends and Family: Once a week    Attends Religious Services: Never    Database Administrator or Organizations: No    Attends Banker Meetings: Never    Marital Status: Never married  Intimate Partner Violence: Not At Risk (09/08/2024)   Humiliation, Afraid, Rape, and Kick questionnaire    Fear of Current or Ex-Partner: No    Emotionally Abused: No    Physically Abused: No    Sexually Abused: No     Current Outpatient Medications:    amLODipine  (NORVASC ) 5 MG tablet, Take 1 tablet (5 mg total) by mouth daily., Disp: 30 tablet, Rfl: 5   aspirin  EC 81 MG tablet, Take by mouth., Disp: , Rfl:    atorvastatin  (LIPITOR) 80 MG tablet, Take 1 tablet (80 mg total) by mouth daily., Disp: 30 tablet, Rfl: 5   Blood Glucose Monitoring Suppl (ACCU-CHEK GUIDE) w/Device KIT, Use to check blood sugar 2 time daily as needed, Disp: 1 kit, Rfl: 0   carvedilol  (COREG ) 12.5 MG tablet, TAKE 1 TABLET BY MOUTH 2 TIMES DAILY., Disp: 180 tablet, Rfl: 2   Continuous Glucose Sensor (DEXCOM  G7 SENSOR) MISC, 1 DEVICE BY DOES NOT APPLY ROUTE AS DIRECTED., Disp: 3 each, Rfl: 11   ENTRESTO  24-26 MG, Take 1 tablet by mouth 2 (two) times daily., Disp: 180 tablet, Rfl: 3   ezetimibe  (ZETIA ) 10 MG tablet, Take 1 tablet (10 mg total) by mouth daily., Disp: 30 tablet, Rfl: 5   gabapentin  (NEURONTIN ) 600 MG tablet, Take 600 mg by mouth 3 (three) times daily., Disp: , Rfl:    Galcanezumab -gnlm (EMGALITY ) 120 MG/ML SOSY, Inject 1 mL into the skin every 30 (thirty) days., Disp: 1.12 mL, Rfl: 5   glucose blood (ACCU-CHEK GUIDE) test strip, Check blood sugar 2 times daily as needed, Disp: 100 each, Rfl: 12   insulin  aspart (NOVOLOG ) 100  UNIT/ML injection, Inject 10 Units into the skin with breakfast, with lunch, and with evening meal., Disp: 10 mL, Rfl: 11   Insulin  Glargine (BASAGLAR  KWIKPEN) 100 UNIT/ML, Inject 70 Units into the skin daily., Disp: 60 mL, Rfl: 3   Insulin  Pen Needle 32G X 4 MM MISC, 1 Device by Does not apply route in the morning, at noon, in the evening, and at bedtime., Disp: 400 each, Rfl: 3   ipratropium (ATROVENT ) 0.03 % nasal spray, PLACE 2 SPRAYS INTO BOTH NOSTRILS 2 (TWO) TIMES DAILY, Disp: 30 mL, Rfl: 2   JARDIANCE  25 MG TABS tablet, Take 1 tablet (25 mg total) by mouth daily., Disp: 90 tablet, Rfl: 2   Lancets (ACCU-CHEK MULTICLIX) lancets, Check sugar 2 times daily as needed., Disp: 100 each, Rfl: 12   levocetirizine (XYZAL ) 5 MG tablet, Take 1 tablet (5 mg total) by mouth in the morning., Disp: 34 tablet, Rfl: 5   linaclotide  (LINZESS ) 290 MCG CAPS capsule, Take 1 capsule (290 mcg total) by mouth daily before breakfast., Disp: 30 capsule, Rfl: 11   MOUNJARO  7.5 MG/0.5ML Pen, Inject 7.5 mg into the skin once a week., Disp: , Rfl:    nitroGLYCERIN  (NITROSTAT ) 0.4 MG SL tablet, PLACE 1 TABLET UNDER THE TONGUE EVERY 5 MINUTES AS NEEDED FOR CHEST PAIN., Disp: 25 tablet, Rfl: 0   Rimegepant Sulfate  (NURTEC) 75 MG TBDP, Take 1 tablet (75 mg total) by mouth daily as needed. Max of  every other day prn, Disp: 16 tablet, Rfl: 0   spironolactone  (ALDACTONE ) 25 MG tablet, Take 1 tablet (25 mg total) by mouth daily., Disp: 90 tablet, Rfl: 3   VENTOLIN  HFA 108 (90 Base) MCG/ACT inhaler, INHALE 2 PUFFS BY MOUTH EVERY 4 HOURS AS NEEDED FOR WHEEZE OR FOR SHORTNESS OF BREATH, Disp: 18 each, Rfl: 1   gabapentin  (NEURONTIN ) 800 MG tablet, Take 800 mg by mouth 3 (three) times daily. (Patient not taking: Reported on 11/14/2024), Disp: , Rfl:   No Known Allergies  I personally reviewed active problem list, medication list, allergies with the patient/caregiver today.   ROS  Ten systems reviewed and is negative except as mentioned in HPI    Objective  Virtual encounter, vitals not obtained.   Physical Exam  Awake, alert and oriented  PHQ2/9:    11/14/2024   10:32 AM 10/03/2024    3:22 PM 09/08/2024    3:32 PM 08/28/2024    2:07 PM 08/25/2024    1:58 PM  Depression screen PHQ 2/9  Decreased Interest 1 2 1 2 2   Down, Depressed, Hopeless 1 1 0 1 1  PHQ - 2 Score 2 3 1 3 3   Altered sleeping 1 1  2 2   Tired, decreased energy 1 1  3 3   Change in appetite 0 0  0 0  Feeling bad or failure about yourself  0 0  0   Trouble concentrating 1 1  1 1   Moving slowly or fidgety/restless 0 0  0 0  Suicidal thoughts 0 0  0 0  PHQ-9 Score 5 6   9  9    Difficult doing work/chores Somewhat difficult Somewhat difficult  Somewhat difficult Somewhat difficult     Data saved with a previous flowsheet row definition   PHQ-2/9 Result is positive.    Fall Risk:    11/14/2024   10:32 AM 10/03/2024    3:19 PM 09/08/2024    4:40 PM 08/28/2024    2:07 PM 08/25/2024    1:55 PM  Fall Risk   Falls in the past year? 1 0 0 0 0  Number falls in past yr: 1 0 0 0 0  Injury with Fall? 0 0  0  0  0   Risk for fall due to : Impaired balance/gait Orthopedic patient;Impaired mobility;Impaired balance/gait Orthopedic patient;Impaired balance/gait;Impaired mobility No Fall Risks Impaired  balance/gait;Impaired mobility;Orthopedic patient  Follow up Falls evaluation completed  Falls evaluation completed;Falls prevention discussed Falls evaluation completed Falls evaluation completed;Falls prevention discussed     Data saved with a previous flowsheet row definition    Assessment & Plan Cervical myelopathy with spinal cord compression and mobility impairment with recurrent falls Cervical myelopathy causing significant mobility impairment and recurrent falls. Surgery recommended to relieve compression and improve function. Steroid injections not advised. - Referred to [facility] in [place] for pain management and further evaluation. - Ordered blood work including A1c and fructosamine for surgical clearance. - Advised to visit [facility] for evaluation of new pain following falls. - Discussed importance of surgery to prevent complications and improve mobility.  Chronic pain syndrome due to spine disease (lumbar spondylosis and facet arthropathy) Chronic pain syndrome secondary to lumbar spondylosis and facet arthropathy, complicating pain management due to cervical myelopathy. - Referred to [facility] in [place] for pain management. - Advised to visit [facility] for evaluation of new pain following falls.  Type 2 diabetes mellitus, poorly controlled, requiring optimization for surgical clearance Type 2 diabetes mellitus with recent poor control. Optimization necessary for surgical clearance. - Ordered A1c and fructosamine to assess diabetes control. - Coordinated with Doctor Leafy for diabetes management and surgical clearance. - Advised to maintain blood glucose control for surgical clearance.  Chronic systolic heart failure and atherosclerotic heart disease of native coronary artery, pending surgical clearance Chronic systolic heart failure and atherosclerotic heart disease with pending surgical clearance. CABG planned  contingent on diabetes control. - Ordered BNP to assess heart failure status. - Advised to optimize diabetes control for surgical clearance.  Chronic kidney disease, stage 3a Chronic kidney disease stage 3a, requiring monitoring. - Ordered kidney function tests as part of routine blood work.  General Health Maintenance Due for flu shot and routine health maintenance. - Advised to receive flu shot at pharmacy or hospital. - Ensured pneumonia vaccination is up to date. - Discussed importance of routine health maintenance and vaccinations.     I discussed the assessment and treatment plan with the patient. The patient was provided an opportunity to ask questions and all were answered. The patient agreed with the plan and demonstrated an understanding of the instructions.  The patient was advised to call back or seek an in-person evaluation if the symptoms worsen or if the condition fails to improve as anticipated.  I provided 25  minutes of non-face-to-face time during this encounter.

## 2024-11-15 ENCOUNTER — Encounter: Payer: Self-pay | Admitting: Emergency Medicine

## 2024-11-15 ENCOUNTER — Emergency Department
Admission: EM | Admit: 2024-11-15 | Discharge: 2024-11-15 | Disposition: A | Discharge status: Home / Self Care | Source: Ambulatory Visit

## 2024-11-15 ENCOUNTER — Emergency Department

## 2024-11-15 ENCOUNTER — Other Ambulatory Visit: Payer: Self-pay

## 2024-11-15 DIAGNOSIS — Z8616 Personal history of COVID-19: Secondary | ICD-10-CM | POA: Diagnosis not present

## 2024-11-15 DIAGNOSIS — W19XXXA Unspecified fall, initial encounter: Secondary | ICD-10-CM | POA: Insufficient documentation

## 2024-11-15 DIAGNOSIS — M62838 Other muscle spasm: Secondary | ICD-10-CM | POA: Insufficient documentation

## 2024-11-15 DIAGNOSIS — S79911A Unspecified injury of right hip, initial encounter: Secondary | ICD-10-CM | POA: Insufficient documentation

## 2024-11-15 DIAGNOSIS — I2510 Atherosclerotic heart disease of native coronary artery without angina pectoris: Secondary | ICD-10-CM | POA: Insufficient documentation

## 2024-11-15 DIAGNOSIS — S39012A Strain of muscle, fascia and tendon of lower back, initial encounter: Secondary | ICD-10-CM | POA: Diagnosis not present

## 2024-11-15 DIAGNOSIS — I130 Hypertensive heart and chronic kidney disease with heart failure and stage 1 through stage 4 chronic kidney disease, or unspecified chronic kidney disease: Secondary | ICD-10-CM | POA: Insufficient documentation

## 2024-11-15 DIAGNOSIS — Z9181 History of falling: Secondary | ICD-10-CM | POA: Diagnosis not present

## 2024-11-15 DIAGNOSIS — E1122 Type 2 diabetes mellitus with diabetic chronic kidney disease: Secondary | ICD-10-CM | POA: Diagnosis not present

## 2024-11-15 DIAGNOSIS — I959 Hypotension, unspecified: Secondary | ICD-10-CM | POA: Diagnosis not present

## 2024-11-15 DIAGNOSIS — M4802 Spinal stenosis, cervical region: Secondary | ICD-10-CM | POA: Diagnosis not present

## 2024-11-15 DIAGNOSIS — N1831 Chronic kidney disease, stage 3a: Secondary | ICD-10-CM | POA: Diagnosis not present

## 2024-11-15 DIAGNOSIS — Z043 Encounter for examination and observation following other accident: Secondary | ICD-10-CM | POA: Diagnosis not present

## 2024-11-15 DIAGNOSIS — M1611 Unilateral primary osteoarthritis, right hip: Secondary | ICD-10-CM | POA: Diagnosis not present

## 2024-11-15 DIAGNOSIS — I5042 Chronic combined systolic (congestive) and diastolic (congestive) heart failure: Secondary | ICD-10-CM | POA: Diagnosis not present

## 2024-11-15 DIAGNOSIS — J45909 Unspecified asthma, uncomplicated: Secondary | ICD-10-CM | POA: Insufficient documentation

## 2024-11-15 DIAGNOSIS — S3992XA Unspecified injury of lower back, initial encounter: Secondary | ICD-10-CM | POA: Diagnosis present

## 2024-11-15 DIAGNOSIS — M47812 Spondylosis without myelopathy or radiculopathy, cervical region: Secondary | ICD-10-CM | POA: Diagnosis not present

## 2024-11-15 DIAGNOSIS — S0990XA Unspecified injury of head, initial encounter: Secondary | ICD-10-CM | POA: Diagnosis not present

## 2024-11-15 MED ORDER — LIDOCAINE 5 % EX PTCH
1.0000 | MEDICATED_PATCH | CUTANEOUS | Status: DC
  Administered 2024-11-15: 1 via TRANSDERMAL
  Filled 2024-11-15: qty 1

## 2024-11-15 MED ORDER — LIDOCAINE 5 % EX PTCH: 1.0000 | MEDICATED_PATCH | Freq: Two times a day (BID) | CUTANEOUS | 0 refills | Status: AC

## 2024-11-15 MED ORDER — NAPROXEN 500 MG PO TBEC: 500.0000 mg | DELAYED_RELEASE_TABLET | Freq: Two times a day (BID) | ORAL | 0 refills | Status: AC

## 2024-11-15 MED ORDER — CYCLOBENZAPRINE HCL 10 MG PO TABS: 10.0000 mg | ORAL_TABLET | Freq: Three times a day (TID) | ORAL | 0 refills | Status: AC | PRN

## 2024-11-15 MED ORDER — KETOROLAC TROMETHAMINE 30 MG/ML IJ SOLN
60.0000 mg | Freq: Once | INTRAMUSCULAR | Status: AC
  Administered 2024-11-15: 60 mg via INTRAMUSCULAR
  Filled 2024-11-15: qty 2

## 2024-11-15 MED ORDER — CYCLOBENZAPRINE HCL 10 MG PO TABS
10.0000 mg | ORAL_TABLET | Freq: Once | ORAL | Status: AC
  Administered 2024-11-15: 10 mg via ORAL
  Filled 2024-11-15: qty 1

## 2024-11-15 NOTE — ED Provider Notes (Signed)
 [facility] Provider Note    Event Date/Time   First MD Initiated Contact with Patient 11/15/24 1942     (approximate)   History   Fall    HPI  Sarah Duffy is a 53 y.o. female    with a past medical history of head injury, angina pectoris, lumbar radiculopathy, asthma, left ankle pain, migraine, cervical spondylosis, diabetes type 2, with no significant past medical history who presents to the ED complaining of fall with subsequent pain in the neck back and right hip. According to the patient, she fell last Sunday, 4 days ago.  Patient endorses she fell on her right hip.  She denies loss of consciousness, blurry vision, nauseas or vomit.  Patient endorses secondary to she was feeling worse with left cervical pain, left lumbar pain and right hip pain.  Patient was able to walk after the fall.  Patient is here with a relative.  Patient is not driving tonight.    Patient Active Problem List   Diagnosis Date Noted   Chronic combined systolic and diastolic CHF (congestive heart failure) (HCC) 07/20/2024   Fall at home, initial encounter 07/20/2024   Type II diabetes mellitus with renal manifestations (HCC) 07/20/2024   Overweight (BMI 25.0-29.9) 07/20/2024   Cervical myelopathy (HCC) 07/20/2024   Impaired gait and mobility 07/20/2024   Lumbar spinal stenosis 07/19/2024   Chronic pain syndrome 07/05/2024   Dyslipidemia due to type 2 diabetes mellitus (HCC) 07/05/2024   Spondylosis, cervical, with myelopathy 04/18/2024   Hip weakness 04/03/2024   Gait instability 04/03/2024   Chronic anemia 03/14/2024   Gait abnormality 11/30/2023   Spinal stenosis of lumbar region 11/30/2023   Chronic kidney disease, stage 3a (HCC) 11/09/2023   Weakness of extremity 09/20/2023   Arthropathy of lumbar facet joint 07/29/2023   Low back pain 07/29/2023   Pain in finger of left hand 07/29/2023   Sacroiliac joint pain 07/29/2023   Contact dermatitis and eczema 06/08/2023    Dysphagia 03/23/2023   Asthma, well controlled 03/09/2023   Depression, major, in remission 03/09/2023   Perennial allergic rhinitis with seasonal variation 03/09/2023   Type 2 diabetes mellitus with diabetic polyneuropathy, with long-term current use of insulin  (HCC) 08/31/2022   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(HCC) 08/31/2022   Hypertension 06/11/2022   Ischemic cardiomyopathy 06/11/2022   Coronary artery disease 06/11/2022   Chronic systolic heart failure (HCC) 06/11/2022   Angina pectoris associated with type 2 diabetes mellitus (HCC) 01/05/2022   Cardiomyopathy (HCC) 04/14/2021   History of 2019 novel coronavirus disease (COVID-19) 06/23/2019   Chronic migraine w/o aura, not intractable, w/o stat migr 01/14/2018   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 associated with type 2 diabetes mellitus (HCC) 05/21/2016   Hyperlipidemia LDL goal <70 03/10/2016   MI (mitral incompetence) 02/25/2016   Insomnia 02/25/2016   Anxiety 09/04/2014     Physical Exam   Triage Vital Signs: ED Triage Vitals  Encounter Vitals Group     BP 11/15/24 1811 91/61     Girls Systolic BP Percentile --      Girls Diastolic BP Percentile --      Boys Systolic BP Percentile --      Boys Diastolic BP Percentile --      Pulse Rate 11/15/24 1811 75     Resp 11/15/24 1811 20     Temp 11/15/24 1813 98.1 F (36.7 C)     Temp Source 11/15/24 1813 Oral  SpO2 11/15/24 1811 100 %     Weight 11/15/24 1812 190 lb (86.2 kg)     Height 11/15/24 1812 5' 9 (1.753 m)     Head Circumference --      Peak Flow --      Pain Score 11/15/24 1812 8     Pain Loc --      Pain Education --      Exclude from Growth Chart --     Most recent vital signs: Vitals:   11/15/24 1811 11/15/24 1813  BP: 91/61   Pulse: 75   Resp: 20   Temp:  98.1 F (36.7 C)  SpO2: 100%      Physical Exam Vitals and nursing note reviewed.  During triage vital signs were normal  General:           Awake, no distress.  Oriented x 4 Head: Atraumatic Neck: Skin is intact, no ecchymosis no hematomas.  No tenderness to palpation in the midline, spinal process.  Tenderness to palpation in the left side of the trapezius. Lumbar spine: Skin is intact, no ecchymosis, no hematomas.  No tenderness to palpation in the midline or spinal process.  Tenderness to palpation in the left paraspinal muscles. Right hip: Skin is intact, no ecchymosis no hematomas.  Tenderness to palpation at the level of the intertrochanteric area.  Full ROM.  Strength 5/5.  Pulses positive. CV:                  Good peripheral perfusion. Regular rate and rhythm. Resp:               Normal effort. no tachypnea.Equal breath sounds bilaterally.  Abd:                 No distention.  Soft nontender            ED Results / Procedures / Treatments   Labs (all labs ordered are listed, but only abnormal results are displayed) Labs Reviewed - No data to display    RADIOLOGY I independently reviewed and interpreted imaging and agree with radiologists findings.      PROCEDURES:  Critical Care performed:   Procedures   MEDICATIONS ORDERED IN ED: Medications  cyclobenzaprine  (FLEXERIL ) tablet 10 mg (has no administration in time range)  ketorolac  (TORADOL ) 30 MG/ML injection 60 mg (has no administration in time range)  lidocaine  (LIDODERM ) 5 % 1 patch (has no administration in time range)   Clinical Course as of 11/15/24 2024  Wed Nov 15, 2024  2003 CT Cervical Spine Wo Contrast . Marked severity degenerative changes at the levels of C5-C6 and C6-C7. 2. No acute cervical spine fracture.   [AE]  2003 CT Head Wo Contrast No acute intracranial pathology. [AE]  2004 Mild degenerative changes without evidence of an acute fracture or dislocation.   [AE]    Clinical Course User Index [AE] Janit Kast, PA-C    IMPRESSION / MDM / ASSESSMENT AND PLAN / ED COURSE  I reviewed the triage vital signs and the  nursing notes.  Differential diagnosis includes, but is not limited to, cervical fracture, cervical subluxation, hip fracture, lumbar strain, soft tissue injury of the hip, intracranial hemorrhage, skull fracture.  Patient's presentation is most consistent with acute complicated illness / injury requiring diagnostic workup.   Sarah Duffy is a 53 y.o., female who presents today with history of 4 days of left cervical pain, left lumbar pain, right hip pain after falling on her  bathroom.  Patient denies loss of consciousness, patient is not taking blood thinners.  See HPI.  I did update patient with results of CT of the head, cervical CT and a hip x-ray Plan Toradol  IM Flexeril  Lidocaine  patch Patient's diagnosis is consistent with fall, trapezius spasm, lumbar strain, right hip soft tissue injury.. I independently reviewed and interpreted imaging and agree with radiologists findings ruling out intracranial hemorrhage, skull fracture, cervical fracture or dislocation, right hip fracture or dislocation.  I did not order any labs, physical exam was reassuring. I did review the patient's allergies and medications.The patient is in stable and satisfactory condition for discharge home  Patient will be discharged home with prescriptions for Flexeril , meloxicam, lidocaine  patch. Patient is to follow up with PCP or [facility] as needed or otherwise directed. Patient is given ED precautions to return to the ED for any worsening or new symptoms. Discussed plan of care with patient, answered all of patient's questions, patient agreeable to plan of care. Advised patient to take medications according to the instructions on the label. Discussed possible side effects of new medications. Patient verbalized understanding.  FINAL CLINICAL IMPRESSION(S) / ED DIAGNOSES   Final diagnoses:  Fall, initial encounter  Trapezius muscle spasm  Strain of lumbar region, initial encounter  Soft tissue injury of right hip,  initial encounter     Rx / DC Orders   ED Discharge Orders          Ordered    cyclobenzaprine  (FLEXERIL ) 10 MG tablet  3 times daily PRN        11/15/24 2024    lidocaine  (LIDODERM ) 5 %  Every 12 hours        11/15/24 2024    naproxen  (EC NAPROSYN ) 500 MG EC tablet  2 times daily with meals        11/15/24 2024             Note:  This document was prepared using Dragon voice recognition software and may include unintentional dictation errors.   Janit Kast, PA-C 11/15/24 2026    Clarine Ozell LABOR, MD 11/15/24 [phone]

## 2024-11-15 NOTE — Discharge Instructions (Addendum)
 You have been diagnosed with fall, trapezius spasm, lumbar strain, right hip soft tissue injury.  Please take Flexeril  1 tablet by mouth every 8 hours.  If you feel very sedated you can take the Flexeril  every 12 hours or only at bedtime.  Please avoid driving while taking Flexeril .  You can apply lidocaine  patch 1 patch onto the skin every 12 hours in your right hip or left shoulder.  Please take naproxen  1 tablet by mouth every 12 hours after breakfast and dinner.  Drink plenty of fluids.  Please come back to ED or go to your PCP if you have new symptoms or symptoms worsen.  It was a pleasure to help you today.  Ramaj Frangos Downs PA-C

## 2024-11-15 NOTE — ED Triage Notes (Signed)
 Patient states while getting out of the shower she felt like she was off balance and pulled down the light fixture causing her to fall on ground. C/o pain to head, neck, back and right hip.

## 2024-11-16 ENCOUNTER — Other Ambulatory Visit: Payer: Self-pay

## 2024-11-16 DIAGNOSIS — M6281 Muscle weakness (generalized): Secondary | ICD-10-CM | POA: Diagnosis not present

## 2024-11-16 DIAGNOSIS — M5000 Cervical disc disorder with myelopathy, unspecified cervical region: Secondary | ICD-10-CM | POA: Diagnosis not present

## 2024-11-16 DIAGNOSIS — M48061 Spinal stenosis, lumbar region without neurogenic claudication: Secondary | ICD-10-CM | POA: Diagnosis not present

## 2024-11-16 DIAGNOSIS — F32A Depression, unspecified: Secondary | ICD-10-CM

## 2024-11-16 NOTE — Telephone Encounter (Signed)
 Copied from CRM #[id number]. Topic: General - Call Back - No Documentation >> Nov 16, 2024 12:34 PM Olam RAMAN wrote: Reason for CRM: [facility] [phone] calling a referral for pt. Provider had declined it

## 2024-11-16 NOTE — Patient Outreach (Signed)
 Complex Care Management   Visit Note  11/16/2024  Name:  Sarah Duffy MRN: [id number] DOB: March 31, 1971  Situation: Referral received for Complex Care Management related to Heart Failure and Diabetes with Complications I obtained verbal consent from Patient.  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 02/2021 Echo: EF 35-40%, glob HK, GrI DD, nl RV fxn, mildly dil LA, mod MR; b. 06/2022 Echo: EF 50-55%, no rwma, GrII DD, nl RV fxn, RVSP , mildly dil LA, mild MR, AoV sclerosis.   CKD (chronic kidney disease), stage III (HCC)    Complication of anesthesia 03/23/2023   Possible gastroparesis based off food in stomach during EGD   Coronary artery disease    a. 04/2021 Cath: LM nl, LAD 70p/m, 97m, 80d, LCX 73m, OM1 40, OM2 99 (fills via collats from OM1), RCA 60d, RPDA 75.   COVID-19 2021   Hyperlipidemia LDL goal <70    Hypertension    Ischemic cardiomyopathy    a. 02/2021 Echo: EF 35-40%, glob HK, GrI DD; b. 06/2022 Echo: EF 50-55%.   Moderate mitral regurgitation    a. 02/2021 Echo: Mod MR.   MRSA infection within last 3 months 02/25/2016   Osteomyelitis of foot (HCC) 08/26/2016   Type II diabetes mellitus (HCC)     Assessment: Patient Reported Symptoms:  Cognitive Cognitive Status: No symptoms reported Cognitive/Intellectual Conditions Management [RPT]: None reported or documented in medical history or problem list   Health Maintenance Behaviors: Annual physical exam  Neurological Neurological Review of Symptoms: Headaches, Weakness Neurological Comment: conference call - patient left message with [facility] requesting call back to schedule new patient appointment, patient will be able to see them for Pain Management also, fall over weekend and worsening pain, denies numbnes or bowel/bladder changes, aware red flags for ED  HEENT HEENT  Symptoms Reported: Not assessed      Cardiovascular Cardiovascular Symptoms Reported: No symptoms reported Cardiovascular Comment: denies HF sx  Respiratory Respiratory Symptoms Reported: No symptoms reported Respiratory Management Strategies: Routine screening  Endocrine Endocrine Symptoms Reported: No symptoms reported Is patient diabetic?: Yes Is patient checking blood sugars at home?: Yes List most recent blood sugar readings, include date and time of day: patient reports improvement in BG readings    Gastrointestinal Gastrointestinal Symptoms Reported: Not assessed      Genitourinary Genitourinary Symptoms Reported: Not assessed    Integumentary Integumentary Symptoms Reported: Not assessed    Musculoskeletal Additional Musculoskeletal Details: Fall on Sunday in shower, ED yesterday, pain severe back, neck right hip, dificulty ambulating/standing,  can only walk with walker, needs assist to get up from chair/out of bed/toilet, cannot be home alone, steps into home require 2 people to go up/down, report PCA 2h day (now coming to friend's house) but no HHC/PT OT since end September, [facility] nurse out to visit Monday discussed CAP program with waiting list, RNCM will request HHC PT/OT safety eval, transport wheelchair from PCP, patient will contact [facility] nurse re getting on waiting list for CAP program nd will call [facility] customer service to determine if qualifies for [facility] Complete, aware of sx to call provider and red flags for ED, disussed writing down sx and questions/concerns to give provider clear concise description of current symptoms, changes from prior and needs Musculoskeletal Management Strategies: Medical device, Medication therapy, Routine screening Musculoskeletal Self-Management Outcome: 2 (bad) Falls  in the past year?: Yes Number of falls in past year: 2 or more Was there an injury with Fall?: Yes Fall Risk Category Calculator: 3 Patient Fall Risk Level: High Fall Risk Patient  at Risk for Falls Due to: Impaired balance/gait, Impaired mobility, Orthopedic patient Fall risk Follow up: Falls evaluation completed, Falls prevention discussed  Psychosocial Psychosocial Symptoms Reported: Sadness - if selected complete PHQ 2-9 Additional Psychological Details: patient reports sadness, frustration I won't claim depression egarding decline in physical condition and worsening pain, financial stresses, has resources and continues to call, support provided and scheduled with LCSW or assist with sadness/depression Behavioral Management Strategies: Support system Major Change/Loss/Stressor/Fears (CP): Medical condition, self, Resources Techniques to [place] with Loss/Stress/Change: Withdraw Quality of Family Relationships: helpful    11/16/2024    PHQ2-9 Depression Screening   Little interest or pleasure in doing things Nearly every day  Feeling down, depressed, or hopeless Nearly every day (down, sad)  PHQ-2 - Total Score 6  Trouble falling or staying asleep, or sleeping too much    Feeling tired or having little energy    Poor appetite or overeating     Feeling bad about yourself - or that you are a failure or have let yourself or your family down    Trouble concentrating on things, such as reading the newspaper or watching television    Moving or speaking so slowly that other people could have noticed.  Or the opposite - being so fidgety or restless that you have been moving around a lot more than usual    Thoughts that you would be better off dead, or hurting yourself in some way Not at all  PHQ2-9 Total Score    If you checked off any problems, how difficult have these problems made it for you to do your work, take care of things at home, or get along with other people    Depression Interventions/Treatment      There were no vitals filed for this visit.    Medications Reviewed Today     Reviewed by Devra Lands, RN (Registered Nurse) on 11/16/24 at 1610  Med List  Status: <None>   Medication Order Taking? Sig Documenting Provider Last Dose Status Informant  amLODipine  (NORVASC ) 5 MG tablet [id number] Yes Take 1 tablet (5 mg total) by mouth daily. [name], Krichna, MD  Active   aspirin  EC 81 MG tablet [id number] Yes Take by mouth. [provider]  Active Self, Pharmacy Records  atorvastatin  (LIPITOR) 80 MG tablet [id number] Yes Take 1 tablet (80 mg total) by mouth daily. [name], Krichna, MD  Active   Blood Glucose Monitoring Suppl (ACCU-CHEK GUIDE) w/Device KIT [id number] Yes Use to check blood sugar 2 time daily as needed [name], Ibtehal Jaralla, MD  Active Self, Pharmacy Records  carvedilol  (COREG ) 12.5 MG tablet [id number] Yes TAKE 1 TABLET BY MOUTH 2 TIMES DAILY. Gerard Frederick, NP  Active Self, Pharmacy Records  Continuous Glucose Sensor (DEXCOM G7 SENSOR) [place] [id number] Yes 1 DEVICE BY DOES NOT APPLY ROUTE AS DIRECTED. [name], Donell Cardinal, MD  Active Self, Pharmacy Records  cyclobenzaprine  (FLEXERIL ) 10 MG tablet [id number]  Take 1 tablet (10 mg total) by mouth 3 (three) times daily as needed for up to 4 days.  Patient not taking: Reported on 11/16/2024   Janit Kast, PA-C  Active   ENTRESTO  24-26 MG [id number] Yes Take 1 tablet by mouth 2 (two) times daily. Gerard Frederick, NP  Active Self, Pharmacy Records  ezetimibe  (ZETIA ) 10 MG tablet  [id number] Yes Take 1 tablet (10 mg total) by mouth daily. [name], Krichna, MD  Active   gabapentin  (NEURONTIN ) 600 MG tablet [id number] Yes Take 600 mg by mouth 3 (three) times daily. [provider]  Active   gabapentin  (NEURONTIN ) 800 MG tablet [id number]  Take 800 mg by mouth 3 (three) times daily.  Patient not taking: Reported on 11/14/2024   [provider]  Active Self, Pharmacy Records  Galcanezumab -gnlm (EMGALITY ) 120 MG/ML SOSY [id number]  Inject 1 mL into the skin every 30 (thirty) days. [name], Krichna, MD  Active Self, Pharmacy Records  glucose blood (ACCU-CHEK GUIDE) test  strip [id number] Yes Check blood sugar 2 times daily as needed [name], Ibtehal Jaralla, MD  Active Self, Pharmacy Records  insulin  aspart (NOVOLOG ) 100 UNIT/ML injection [id number] Yes Inject 10 Units into the skin with breakfast, with lunch, and with evening meal. Wouk, Devaughn Sayres, MD  Active   Insulin  Glargine (BASAGLAR  [facility]) 100 UNIT/ML [id number] Yes Inject 70 Units into the skin daily. [name], Donell Cardinal, MD  Active Self, Pharmacy Records  Insulin  Pen Needle 32G X 4 MM MISC [id number] Yes 1 Device by Does not apply route in the morning, at noon, in the evening, and at bedtime. [name], Donell Cardinal, MD  Active Self, Pharmacy Records  ipratropium (ATROVENT ) 0.03 % nasal spray [id number] Yes PLACE 2 SPRAYS INTO BOTH NOSTRILS 2 (TWO) TIMES DAILY [name], Krichna, MD  Active   JARDIANCE  25 MG TABS tablet [id number] Yes Take 1 tablet (25 mg total) by mouth daily. [name], Donell Cardinal, MD  Active Self, Pharmacy Records  Lancets [facility] MULTICLIX) lancets [id number] Yes Check sugar 2 times daily as needed. [name], Donell Cardinal, MD  Active Self, Pharmacy Records  levocetirizine (XYZAL ) 5 MG tablet [id number] Yes Take 1 tablet (5 mg total) by mouth in the morning. [name], Krichna, MD  Active Self, Pharmacy Records  lidocaine  (LIDODERM ) 5 % [id number]  Place 1 patch onto the skin every 12 (twelve) hours for 5 days. Remove & Discard patch within 12 hours or as directed by MD  Patient not taking: Reported on 11/16/2024   Janit Kast, PA-C  Active   linaclotide  (LINZESS ) 290 MCG CAPS capsule [id number] Yes Take 1 capsule (290 mcg total) by mouth daily before breakfast. Therisa Bi, MD  Active Self, Pharmacy Records  MOUNJARO  7.5 MG/0.5ML Pen [id number] Yes Inject 7.5 mg into the skin once a week. [provider]  Active   naproxen  (EC NAPROSYN ) 500 MG EC tablet [id number]  Take 1 tablet (500 mg total) by mouth 2 (two) times daily with a meal.  Patient not taking:  Reported on 11/16/2024   Janit Kast, PA-C  Active   nitroGLYCERIN  (NITROSTAT ) 0.4 MG SL tablet [id number] Yes PLACE 1 TABLET UNDER THE TONGUE EVERY 5 MINUTES AS NEEDED FOR CHEST PAIN. Darliss Rogue, MD  Active Self, Pharmacy Records  Rimegepant Sulfate  [facility]) 75 MG TBDP [id number] Yes Take 1 tablet (75 mg total) by mouth daily as needed. Max of every other day prn [name], Krichna, MD  Active Self, Pharmacy Records  spironolactone  (ALDACTONE ) 25 MG tablet [id number] Yes Take 1 tablet (25 mg total) by mouth daily. Darliss Rogue, MD  Active Self, Pharmacy Records  VENTOLIN  HFA 108 [phone] Base) MCG/ACT inhaler [id number] Yes INHALE 2 PUFFS BY MOUTH EVERY 4 HOURS AS NEEDED FOR WHEEZE OR FOR SHORTNESS OF BREATH [name], Krichna, MD  Active             Recommendation:   PCP Follow-up Specialty provider  follow-up [facility] Continue Current Plan of Care  Follow Up Plan:   Telephone follow-up 2 Rochell Puett  [name], MSN, RN [facility]  [facility], [facility] RN Care Manager Direct Dial : [phone] Fax: [phone]

## 2024-11-16 NOTE — Patient Instructions (Signed)
 Visit Information  Sarah Duffy was given information about Medicaid Managed Care team care coordination services as a part of their [facility] Medicaid benefit.   If you would like to schedule transportation through your [facility], please call the following number at least 2 days in advance of your appointment: [phone]   Rides for urgent appointments can also be made after hours by calling Member Services.  Call the Behavioral Health Crisis Line at [phone], at any time, 24 hours a day, 7 days a week. If you are in danger or need immediate medical attention call 911.  Please see education materials related to Pain, Fall Prevention provided by MyChart link.  Care plan and visit instructions communicated with the patient verbally today. Patient agrees to receive a copy in MyChart. Active MyChart status and patient understanding of how to access instructions and care plan via MyChart confirmed with patient.     Telephone follow up appointment with Managed Medicaid care management team member scheduled for: 11/30/2024 at 2:00 pm  [name], MSN, RN [facility]  [facility], [facility] RN Care Manager Direct Dial : [phone] Fax: [phone]'   Fall Prevention in the Home, Adult Falls can cause injuries and can happen to people of all ages. There are many things you can do to make your home safer and to help prevent falls. What actions can I take to prevent falls? General information Use good lighting in all rooms. Make sure to: Replace any light bulbs that burn out. Turn on the lights in dark areas and use night-lights. Keep items that you use often in easy-to-reach places. Lower the shelves around your home if needed. Move furniture so that there are clear paths around it. Do not use throw rugs or other things on the floor that can make you trip. If any of your floors are uneven, fix them. Add color or  contrast paint or tape to clearly mark and help you see: Grab bars or handrails. First and last steps of staircases. Where the edge of each step is. If you use a ladder or stepladder: Make sure that it is fully opened. Do not climb a closed ladder. Make sure the sides of the ladder are locked in place. Have someone hold the ladder while you use it. Know where your pets are as you move through your home. What can I do in the bathroom?     Keep the floor dry. Clean up any water on the floor right away. Remove soap buildup in the bathtub or shower. Buildup makes bathtubs and showers slippery. Use non-skid mats or decals on the floor of the bathtub or shower. Attach bath mats securely with double-sided, non-slip rug tape. If you need to sit down in the shower, use a non-slip stool. Install grab bars by the toilet and in the bathtub and shower. Do not use towel bars as grab bars. What can I do in the bedroom? Make sure that you have a light by your bed that is easy to reach. Do not use any sheets or blankets on your bed that hang to the floor. Have a firm chair or bench with side arms that you can use for support when you get dressed. What can I do in the kitchen? Clean up any spills right away. If you need to reach something above you, use a step stool with a grab bar. Keep electrical cords out of the way. Do not use floor polish or  wax that makes floors slippery. What can I do with my stairs? Do not leave anything on the stairs. Make sure that you have a light switch at the top and the bottom of the stairs. Make sure that there are handrails on both sides of the stairs. Fix handrails that are broken or loose. Install non-slip stair treads on all your stairs if they do not have carpet. Avoid having throw rugs at the top or bottom of the stairs. Choose a carpet that does not hide the edge of the steps on the stairs. Make sure that the carpet is firmly attached to the stairs. Fix carpet  that is loose or worn. What can I do on the outside of my home? Use bright outdoor lighting. Fix the edges of walkways and driveways and fix any cracks. Clear paths of anything that can make you trip, such as tools or rocks. Add color or contrast paint or tape to clearly mark and help you see anything that might make you trip as you walk through a door, such as a raised step or threshold. Trim any bushes or trees on paths to your home. Check to see if handrails are loose or broken and that both sides of all steps have handrails. Install guardrails along the edges of any raised decks and porches. Have leaves, snow, or ice cleared regularly. Use sand, salt, or ice melter on paths if you live where there is ice and snow during the winter. Clean up any spills in your garage right away. This includes grease or oil spills. What other actions can I take? Review your medicines with your doctor. Some medicines can cause dizziness or changes in blood pressure, which increase your risk of falling. Wear shoes that: Have a low heel. Do not wear high heels. Have rubber bottoms and are closed at the toe. Feel good on your feet and fit well. Use tools that help you move around if needed. These include: Canes. Walkers. Scooters. Crutches. Ask your doctor what else you can do to help prevent falls. This may include seeing a physical therapist to learn to do exercises to move better and get stronger. Where to find more information Centers for Disease Control and Prevention, [employer]: tonerpromos.no [employer] on Aging: baseringtones.pl National Institute on Aging: baseringtones.pl Contact a doctor if: You are afraid of falling at home. You feel weak, drowsy, or dizzy at home. You fall at home. Get help right away if you: Lose consciousness or have trouble moving after a fall. Have a fall that causes a head injury. These symptoms may be an emergency. Get help right away. Call 911. Do not wait to see if the  symptoms will go away. Do not drive yourself to the hospital. This information is not intended to replace advice given to you by your health care provider. Make sure you discuss any questions you have with your health care provider. Document Revised: 08/03/2022 Document Reviewed: 08/03/2022 Elsevier Patient Education  2024 Else[employer] Inc.  Acute Pain, Adult Acute pain is a type of sudden pain that may last for just a few days or for as long as three months. It is often related to an illness, injury, or a medical procedure. Acute pain may be mild, moderate, or severe. Pain can make it hard for you to do your daily activities. It can cause anxiety and lead to other problems if it is not treated. Treatment may not take all the pain away, but it may lessen the pain so  you can move around and tolerate it. Pain is best treated with medicines and other therapies such as distraction, meditation, oils from plants (aromatherapy), heat, and ice. Treatment depends on the cause of the pain and how severe it is. Acute pain usually goes away once your injury has healed or you are no longer ill. Follow these instructions at home: Medicines  Take over-the-counter and prescription medicines only as told by your health care provider. Take the lowest dose of medicine for the shortest amount of time needed to relieve the pain. If you are taking prescription pain medicine: Do not stop taking the medicine suddenly. Talk to your health care provider about how and when to stop taking prescription medicine. Do not take more pills than told by your health care provider even if your pain is severe. Do not take other over-the-counter pain medicines in addition to prescription pain medicine unless told by your health care provider. Keep your medicine in a safe place, away from children or anyone who could use it in a way that it was not prescribed. Ask your health care provider if the medicine prescribed to you requires you to  avoid driving or using machinery. Managing pain, stiffness, and swelling     If told, put ice on the affected area. Put ice in a plastic bag. Place a towel between your skin and the bag. Leave the ice on for 20 minutes, 2-3 times a day. If told, apply heat to the affected area as often as told by your health care provider. Use the heat source that your health care provider recommends, such as a moist heat pack or a heating pad. Place a towel between your skin and the heat source. Leave the heat on for 20-30 minutes. If your skin turns bright red, remove the ice or heat right away to prevent skin damage. The risk of damage is higher if you cannot feel pain, heat, or cold. Managing constipation Your medicines may cause constipation. To prevent or treat constipation, you may need to: Drink enough fluid to keep your urine pale yellow. Take over-the-counter or prescription medicines. Eat foods that are high in fiber, such as beans, whole grains, and fresh fruits and vegetables. Limit foods that are high in fat and processed sugars, such as fried or sweet foods. Activity Rest as told by your health care provider. Return to your normal activities as told by your health care provider. Ask your health care provider what activities are safe for you. Ask your health care provider if doing physical therapy exercises to improve movement and strength can help you manage your pain. General instructions Check your pain level as told by your health care provider. Ask your health care provider if distraction, relaxation, or aromatherapy can help you manage your pain. Keep all follow-up visits. Your health care provider will monitor your pain level. Contact a health care provider if: Your pain is not controlled by medicine. Your pain does not improve or gets worse. You have side effects from pain medicines. Get help right away if: You have severe pain. You have trouble breathing. You faint, or another  person sees you faint. You have chest pain or pressure that lasts for more than a few minutes, or if you have other symptoms along with chest pain, including: Pain or discomfort in one or both arms, your back, neck, jaw, or stomach. Shortness of breath. A cold sweat. Nausea. Feeling light-headed. These symptoms may be an emergency. Get help right away. Call 911.  Do not wait to see if the symptoms will go away. Do not drive yourself to the hospital. This information is not intended to replace advice given to you by your health care provider. Make sure you discuss any questions you have with your health care provider. Document Revised: 06/24/2022 Document Reviewed: 06/24/2022 Elsevier Patient Education  2024 [employer].  Following is a copy of your plan of care:  There are no care plans that you recently modified to display for this patient.

## 2024-11-20 DIAGNOSIS — M48061 Spinal stenosis, lumbar region without neurogenic claudication: Secondary | ICD-10-CM | POA: Diagnosis not present

## 2024-11-20 DIAGNOSIS — M5000 Cervical disc disorder with myelopathy, unspecified cervical region: Secondary | ICD-10-CM | POA: Diagnosis not present

## 2024-11-20 DIAGNOSIS — M6281 Muscle weakness (generalized): Secondary | ICD-10-CM | POA: Diagnosis not present

## 2024-11-20 NOTE — Patient Outreach (Signed)
 RNCM - patient called to otify transport wheelchair received today and clarify who to cal about transportation. Discussed - patient will call previous transportation provider to make aware wheelchair and does not need assist to ambulate safely.

## 2024-11-21 ENCOUNTER — Encounter: Payer: Self-pay | Admitting: Licensed Clinical Social Worker

## 2024-11-21 ENCOUNTER — Telehealth: Payer: Self-pay | Admitting: Licensed Clinical Social Worker

## 2024-11-21 NOTE — Patient Instructions (Signed)
 Sarah Duffy - I am sorry I was unable to reach you today for our scheduled appointment. I work with [name], Krichna, MD and am calling to support your healthcare needs. Please contact me at [phone] at your earliest convenience. I look forward to speaking with you soon.   Thank you,  Cena Ligas, LCSW Clinical Social Worker [facility] Population Health

## 2024-11-22 ENCOUNTER — Other Ambulatory Visit: Payer: Self-pay | Admitting: Internal Medicine

## 2024-11-22 DIAGNOSIS — M545 Low back pain, unspecified: Secondary | ICD-10-CM | POA: Diagnosis not present

## 2024-11-23 DIAGNOSIS — M545 Low back pain, unspecified: Secondary | ICD-10-CM | POA: Diagnosis not present

## 2024-11-24 DIAGNOSIS — M545 Low back pain, unspecified: Secondary | ICD-10-CM | POA: Diagnosis not present

## 2024-11-25 DIAGNOSIS — M545 Low back pain, unspecified: Secondary | ICD-10-CM | POA: Diagnosis not present

## 2024-11-27 DIAGNOSIS — M545 Low back pain, unspecified: Secondary | ICD-10-CM | POA: Diagnosis not present

## 2024-11-28 ENCOUNTER — Telehealth: Payer: Self-pay

## 2024-11-28 DIAGNOSIS — M545 Low back pain, unspecified: Secondary | ICD-10-CM | POA: Diagnosis not present

## 2024-11-29 DIAGNOSIS — M545 Low back pain, unspecified: Secondary | ICD-10-CM | POA: Diagnosis not present

## 2024-11-30 ENCOUNTER — Ambulatory Visit: Payer: Self-pay

## 2024-11-30 ENCOUNTER — Ambulatory Visit: Admitting: Neurosurgery

## 2024-11-30 ENCOUNTER — Other Ambulatory Visit: Payer: Self-pay

## 2024-11-30 DIAGNOSIS — M4712 Other spondylosis with myelopathy, cervical region: Secondary | ICD-10-CM

## 2024-11-30 DIAGNOSIS — M4802 Spinal stenosis, cervical region: Secondary | ICD-10-CM

## 2024-11-30 DIAGNOSIS — G959 Disease of spinal cord, unspecified: Secondary | ICD-10-CM | POA: Diagnosis not present

## 2024-11-30 DIAGNOSIS — M545 Low back pain, unspecified: Secondary | ICD-10-CM | POA: Diagnosis not present

## 2024-11-30 NOTE — Progress Notes (Signed)
 Hide a follow-up phone call today with Ms. Edgren.  She was at home and I was in the office.  Gave consent to go forward with a phone visit.  She states that she has had some recent falls and wanted to follow-up regarding her cervical myelopathy.  We did discuss how we both wanted to be able to move forward with her cervical decompression and are very hopeful that her repeat A1c testing is in a safe range for moving forward.  I did discuss with her that her speech changes were likely not related to her cervical myelopathy but that her arm issues as well as her ambulatory issues are almost certainly worsening given her cervical stenosis and cervical myelopathy.  She is working on getting her repeat testing done this week or next and would like to follow-up after with the plan.  If this is within a safe range we will plan on moving forward with booking her for her cervical decompression and fusion.  Risks and benefits were discussed.  Total of 10 minutes were spent on the phone call today.  Penne MICAEL Sharps, MD

## 2024-11-30 NOTE — Patient Instructions (Signed)
 Visit Information  Ms. Urbani was given information about Medicaid Managed Care team care coordination services as a part of their [facility] Community Plan Medicaid benefit.   If you would like to schedule transportation through your [facility], please call the following number at least 2 days in advance of your appointment: [phone]   Rides for urgent appointments can also be made after hours by calling Member Services.  Call the Behavioral Health Crisis Line at [phone], at any time, 24 hours a day, 7 days a week. If you are in danger or need immediate medical attention call 911.  Please see education materials related to Falls, Energy Conservation provided by [employer] link.  Care plan and visit instructions communicated with the patient verbally today. Patient agrees to receive a copy in MyChart. Active MyChart status and patient understanding of how to access instructions and care plan via MyChart confirmed with patient.     Telephone follow up appointment with Managed Medicaid care management team member scheduled for:  [name], MSN, RN [facility]  [facility], [facility] RN Care Manager Direct Dial : [phone] Fax: [phone]   Energy Conservation Techniques  Sit for as many activities as possible. Use slow, smooth movements.  Rushing increases discomfort. Determine the necessity of performing the task.  Simplify those tasks that are necessary.  (Get clothes out of the dryer when they are warm instead of ironing, let dishes air dry, etc.) Take frequent rests both during and between activities.  Avoid repetitive tasks. Pre-plan your activities; try a daily and/or weekly schedule.  Spread out the activities that are most fatiguing (break up cleaning tasks over multiple days). Remember to plan a balance of work, rest and recreation. Consider the best time for each activity.  Do the most exertive task when you  have the most energy. Don't carry items if you can push them.  Slide, don't lift. Push, don't pull. Utilize two hands when appropriate. Maintain good posture and use proper body mechanics. Avoid remaining in one position for too long. When lifting, bend at the knees, not at the waist.  Exhale when bending down, inhale when straightening up. Carry objects as close to your body and as near to the center of the pelvis.  11. Avoid wasted body movements (position yourself for the task so that you avoid bending, twisting, etc.                when possible). 12. Select the best working environment.  Consider lighting, ventilation, clothing, and equipment. 13. Organize your storage areas, making the items you use daily convenient.  Store heaviest items at waist            height.  Store frequently used items between shoulders and knee height.  Consider leaving frequently used       items on countertops.  (You can organize in storage baskets based on time used/purpose). 14. Feelings and emotions can be real causes of fatigue.  Try to avoid unnecessary worry, irritation, or                    frustration.  Avoid stress, it can also be a source of fatigue. 15. Get help from other people for difficult tasks. 16. Explore equipment or items that may be able to do the job for you with greater ease.  (Electric can        openers, blenders, lightweight items for cleaning, etc.)   Understanding Your Risk  for Falls Millions of people have serious injuries from falls each year. It is important to understand your risk of falling. Talk with your health care provider about your risk and what you can do to lower it. If you do have a serious fall, make sure to tell your provider. Falling once raises your risk of falling again. How can falls affect me? Serious injuries from falls are common. These include: Broken bones, such as hip fractures. Head injuries, such as traumatic brain injuries (TBI) or concussions. A fear  of falling can cause you to avoid activities and stay at home. This can make your muscles weaker and raise your risk for a fall. What can increase my risk? There are a number of risk factors that increase your risk for falling. The more risk factors you have, the higher your risk of falling. Serious injuries from a fall happen most often to people who are older than 55 years old. Teenagers and young adults ages 46-29 are also at higher risk. Common risk factors include: Weakness in the lower body. Being generally weak or confused due to long-term (chronic) illness. Dizziness or balance problems. Poor vision. Medicines that cause dizziness or drowsiness. These may include: Medicines for your blood pressure, heart, anxiety, insomnia, or swelling (edema). Pain medicines. Muscle relaxants. Other risk factors include: Drinking alcohol. Having had a fall in the past. Having foot pain or wearing improper footwear. Working at a dangerous job. Having any of the following in your home: Tripping hazards, such as floor clutter or loose rugs. Poor lighting. Pets. Having dementia or memory loss. What actions can I take to lower my risk of falling?     Physical activity Stay physically fit. Do strength and balance exercises. Consider taking a regular class to build strength and balance. Yoga and tai chi are good options. Vision Have your eyes checked every year and your prescription for glasses or contacts updated as needed. Shoes and walking aids Wear non-skid shoes. Wear shoes that have rubber soles and low heels. Do not wear high heels. Do not walk around the house in socks or slippers. Use a cane or walker as told by your provider. Home safety Attach secure railings on both sides of your stairs. Install grab bars for your bathtub, shower, and toilet. Use a non-skid mat in your bathtub or shower. Attach bath mats securely with double-sided, non-slip rug tape. Use good lighting in all  rooms. Keep a flashlight near your bed. Make sure there is a clear path from your bed to the bathroom. Use night-lights. Do not use throw rugs. Make sure all carpeting is taped or tacked down securely. Remove all clutter from walkways and stairways, including extension cords. Repair uneven or broken steps and floors. Avoid walking on icy or slippery surfaces. Walk on the grass instead of on icy or slick sidewalks. Use ice melter to get rid of ice on walkways in the winter. Use a cordless phone. Questions to ask your health care provider Can you help me check my risk for a fall? Do any of my medicines make me more likely to fall? Should I take a vitamin D supplement? What exercises can I do to improve my strength and balance? Should I make an appointment to have my vision checked? Do I need a bone density test to check for weak bones (osteoporosis)? Would it help to use a cane or a walker? Where to find more information Centers for Disease Control and Prevention, STEADI: tonerpromos.no Community-Based Fall  Prevention Programs: tonerpromos.no [employer] on Aging: baseringtones.pl Contact a health care provider if: You fall at home. You are afraid of falling at home. You feel weak, drowsy, or dizzy. This information is not intended to replace advice given to you by your health care provider. Make sure you discuss any questions you have with your health care provider. Document Revised: 08/03/2022 Document Reviewed: 08/03/2022 Elsevier Patient Education  2024 [employer].   Following is a copy of your plan of care:   Goals Addressed             This Visit's Progress    VBCI RN Care Plan - CHF/HTN   On track    Problems:  Chronic Disease Management support and education needs related to CHF and HTN  Goal: Over the next 90 days the Patient will attend all scheduled medical appointments: Patient will attend all appointments as evidenced by chart review        continue to work with RN Care  Manager and/or Social Worker to address care management and care coordination needs related to CHF as evidenced by adherence to care management team scheduled appointments     take all medications exactly as prescribed and will call provider for medication related questions as evidenced by patient report    verbalize basic understanding of CHF and HTN disease process and self health management plan as evidenced by taking medications as prescribed, checking BP daily, purchasing scale and checking weight daily, until scale, assess for other sx of HF using Action plan and education provided, lifestyle modifications including low salt cardiac healthy diet, exercise  Interventions:   Heart Failure Interventions: Basic overview and discussion of pathophysiology of Heart Failure reviewed Provided education on low sodium diet Reviewed Heart Failure Action Plan in depth and provided written copy - no sx HF Assessed need for readable accurate scales in home - weighing a couple times a week Discussed importance of daily weight and advised patient to weigh and record daily - ptient will request scale from [facility] nurse Reviewed role of diuretics in prevention of fluid overload and management of heart failure; no sx HF Discussed the importance of keeping all appointments with provider Advised patient to discuss medications received from Rehab (not taking) with provider Screening for signs and symptoms of depression related to chronic disease state Referred to LCSW Assessed social determinant of health barriers  Patient focus on pain at this time, denies HF sx, needs neck surgery before she can do cardiac surgery. PCA services 2h week and reports needing additional assist, RNCM will request HHC eval and trnsport wheelchair.  Patient will ask [facility] nurse about getting on waiting list (discussed at visit this week) patient will call [employer] to determine if qualifies for Dual Complete  BP running low in am  - appointment  with Dr Wiliam made during visit and fall recautions/ED precautions given  Hypertension Interventions: Last practice recorded BP readings:  BP Readings from Last 3 Encounters:  11/15/24 91/61  10/03/24 120/72  09/11/24 106/62   Most recent eGFR/CrCl:  Lab Results  Component Value Date   EGFR 58 (L) 12/10/2023    No components found for: CRCL  Evaluation of current treatment plan related to hypertension self management and patient's adherence to plan as established by provider Provided education to patient re: stroke prevention, s/s of heart attack and stroke Reviewed medications with patient and discussed importance of compliance Discussed plans with patient for ongoing care management follow up and provided patient with  direct contact information for care management team Advised patient, providing education and rationale, to monitor blood pressure daily and record, calling PCP for findings outside established parameters Provided education on prescribed diet low salt cardiac healthy Discussed complications of poorly controlled blood pressure such as heart disease, stroke, circulatory complications, vision complications, kidney impairment, sexual dysfunction Screening for signs and symptoms of depression related to chronic disease state  Assessed social determinant of health barriers  Patient Self-Care Activities:  Attend all scheduled provider appointments Call pharmacy for medication refills 3-7 days in advance of running out of medications Call provider office for new concerns or questions  Take medications as prescribed   Work with the social worker to address care coordination needs and will continue to work with the clinical team to address health care and disease management related needs call office if I gain more than 2 pounds in one day or 5 pounds in one week do ankle pumps when sitting keep legs up while sitting track weight in diary use salt in moderation watch for  swelling in feet, ankles and legs every day weigh myself daily eat more whole grains, fruits and vegetables, lean meats and healthy fats check blood pressure daily learn about high blood pressure keep a blood pressure log take blood pressure log to all doctor appointments call doctor for signs and symptoms of high blood pressure keep all doctor appointments take medications for blood pressure exactly as prescribed report new symptoms to your doctor eat more whole grains, fruits and vegetables, lean meats and healthy fats  Plan:  Telephone follow up appointment with care management team member scheduled for:  12/28/2024 1:30 pm          VBCI RN Care Plan - DM   On track    Problems:  Chronic Disease Management support and education needs related to DMII  Goal: Over the next 90 days the Patient will attend all scheduled medical appointments: patient will attend all appointments as evidenced by chart review        continue to work with RN Care Manager and/or Social Worker to address care management and care coordination needs related to DMII as evidenced by adherence to care management team scheduled appointments     take all medications exactly as prescribed and will call provider for medication related questions as evidenced by reporting compliance with all medications    verbalize basic understanding of DMII disease process and self health management plan as evidenced by taking all medications as ordered, checking FBG daily, lifestyle modifications including diabetic diet, exercise as tolerated, attending DM classes through endocrinology  Interventions:   Diabetes Interventions: Assessed patient's understanding of A1c goal: <7% Provided education to patient about basic DM disease process Reviewed medications with patient and discussed importance of medication adherence Counseled on importance of regular laboratory monitoring as prescribed Discussed plans with patient for ongoing  care management follow up and provided patient with direct contact information for care management team Provided patient with written educational materials related to hypo and hyperglycemia and importance of correct treatment Reviewed scheduled/upcoming provider appointments including: DM Education & PCP Advised patient, providing education and rationale, to check cbg using Dexcom 7 and record, calling Endocrinology  for findings outside established parameters Referral made to social work team for assistance with Financial strain - BSW appointment 08/31/24 at 11:00 am - patient following with BSW - applied for [employer], interview with disability  Review of patient status, including review of consultants reports, relevant laboratory and  other test results, and medications completed Screening for signs and symptoms of depression related to chronic disease state - Referral to LCSW - sadness, depression 09/13/2024 at 10:00 am - following with LCSW - completing online paperwork with community based [facility] to schedule appointment Assessed social determinant of health barriers - Multiple needs Rescheduled with BSW for 09/11/24 Instructed patient to [facility] if she does not get call/scheduled with PT by Tuesday. Sending request for elevated toilet seat order - received but not high enough - patient will contact company to switch out Discussed using manual BGM when Dexcom alarms due to lag time and patient overcorrecting low BG. Noted DM Nutrition appointment so can get omnipod 11/3 Patient pending cervical decompression surgery - labs including [facility] before scheduling Patient reports BG improving, issues with pain since fall over weekend. Lab Results  Component Value Date   HGBA1C 10.4 (H) 07/21/2024    Patient Self-Care Activities:  Attend all scheduled provider appointments Call pharmacy for medication refills 3-7 days in advance of running out of medications Call provider office for new concerns or questions   Take medications as prescribed   Work with the social worker to address care coordination needs and will continue to work with the clinical team to address health care and disease management related needs keep appointment with eye doctor check blood sugar at prescribed times: FBG and Per endocrinology - Dexcom 7 check feet daily for cuts, sores or redness drink 6 to 8 glasses of water each day fill half of plate with vegetables limit fast food meals to no more than 1 per week manage portion size prepare main meal at home 3 to 5 days each week switch to low-fat or skim milk switch to sugar-free drinks do heel pump exercise 2 to 3 times each day keep feet up while sitting wash and dry feet carefully every day wear comfortable, cotton socks wear comfortable, well-fitting shoes Take dexcom to all appointments for download  Plan:  Telephone follow up appointment with care management team member scheduled for:  12/28/2024 1:30 pm

## 2024-11-30 NOTE — Telephone Encounter (Signed)
 FYI Only or Action Required?: FYI only for provider: appointment scheduled on 12/04/24.  Patient was last seen in primary care on 11/14/2024 by Glenard Mire, MD.  Called Nurse Triage reporting Dizziness and Fall.  Symptoms began several weeks ago.  Interventions attempted: Rest, hydration, or home remedies.  Symptoms are: unchanged.  Triage Disposition: See PCP When Office is Open (Within 3 Days)  Patient/caregiver understands and will follow disposition?: Yes  Reason for Disposition  [1] MILD dizziness (e.g., walking normally) AND [2] has NOT been evaluated by doctor (or NP/PA) for this  (Exception: Dizziness caused by heat exposure, sudden standing, or poor fluid intake.)  Answer Assessment - Initial Assessment Questions Patient states that she fell about 2 weeks ago and went to urgent care who then sent her to the ED. She was evaluated there for any injuries or causes, but nothing was found. She was sent home and told to follow up with her PCP. She recently fell again this week and is unsure of the cause. She does report feeling lightheaded on some mornings starting 11/13/24 and has started checking her BP in the mornings and reports some readings around 88/66. She states that BP goes up after she eats. She states that she did not check her BP the morning of this last fall. Denies lightheadedness during time of triage. She does have chronic back pain requiring surgery that also causes leg weakness. Office visit advised.   1. DESCRIPTION: Describe your dizziness.     Lightheadedness  2. LIGHTHEADED: Do you feel lightheaded? (e.g., somewhat faint, woozy, weak upon standing)     Yes, not currently feeling lightheaded but sometimes in the mornings when waking up  3. VERTIGO: Do you feel like either you or the room is spinning or tilting? (i.e., vertigo)     No  4. SEVERITY: How bad is it?  Do you feel like you are going to faint? Can you stand and walk?     Able to stand  and walk  5. ONSET:  When did the dizziness begin?     Started feeling this around 11/13/24  6. AGGRAVATING FACTORS: Does anything make it worse? (e.g., standing, change in head position)     Unknown  7. HEART RATE: Can you tell me your heart rate? How many beats in 15 seconds?  (Note: Not all patients can do this.)       Unknown  8. CAUSE: What do you think is causing the dizziness? (e.g., decreased fluids or food, diarrhea, emotional distress, heat exposure, new medicine, sudden standing, vomiting; unknown)     Possibly low blood pressure; Patient states that she has been trying to check her BP in the mornings and she has gotten some readings around 88/66, but did not check it the day of her most recent fall  9. RECURRENT SYMPTOM: Have you had dizziness before? If Yes, ask: When was the last time? What happened that time?     No  10. OTHER SYMPTOMS: Do you have any other symptoms? (e.g., fever, chest pain, vomiting, diarrhea, bleeding)       Denies chest pain, SOB  11. PREGNANCY: Is there any chance you are pregnant? When was your last menstrual period?       NA  Protocols used: Dizziness - Lightheadedness-A-AH  Copied from CRM #[id number]. Topic: Clinical - Red Word Triage >> Nov 30, 2024  2:32 PM Winona R wrote: Pt has fallen on Sun/ Mon pt can't remember . She has also fallen two  weeks ago and went to the ER. Not sure the reasons for the fall but would like to schedule an appointment.

## 2024-11-30 NOTE — Patient Outreach (Signed)
 Complex Care Management   Visit Note  11/30/2024  Name:  Sarah Duffy MRN: [id number] DOB: October 08, 1971  Situation: Referral received for Complex Care Management related to Heart Failure, Diabetes with Complications, and HTN I obtained verbal consent from Patient.  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 02/2021 Echo: EF 35-40%, glob HK, GrI DD, nl RV fxn, mildly dil LA, mod MR; b. 06/2022 Echo: EF 50-55%, no rwma, GrII DD, nl RV fxn, RVSP , mildly dil LA, mild MR, AoV sclerosis.   CKD (chronic kidney disease), stage III (HCC)    Complication of anesthesia 03/23/2023   Possible gastroparesis based off food in stomach during EGD   Coronary artery disease    a. 04/2021 Cath: LM nl, LAD 70p/m, 40m, 80d, LCX 57m, OM1 40, OM2 99 (fills via collats from OM1), RCA 60d, RPDA 75.   COVID-19 2021   Hyperlipidemia LDL goal <70    Hypertension    Ischemic cardiomyopathy    a. 02/2021 Echo: EF 35-40%, glob HK, GrI DD; b. 06/2022 Echo: EF 50-55%.   Moderate mitral regurgitation    a. 02/2021 Echo: Mod MR.   MRSA infection within last 3 months 02/25/2016   Osteomyelitis of foot (HCC) 08/26/2016   Type II diabetes mellitus (HCC)     Assessment: Patient Reported Symptoms:  Cognitive Cognitive Status: No symptoms reported Cognitive/Intellectual Conditions Management [RPT]: None reported or documented in medical history or problem list   Health Maintenance Behaviors: Annual physical exam  Neurological Neurological Review of Symptoms: Weakness, Headaches Neurological Comment: headaches not as frequent or as severe recently, legs weak  HEENT HEENT Symptoms Reported: No symptoms reported      Cardiovascular Cardiovascular Symptoms Reported: No symptoms reported Cardiovascular Management Strategies: Medication therapy, Routine screening Cardiovascular Comment: BP  yesterday 97/66  then 120/76 after eating, no sx HF  Respiratory Respiratory Symptoms Reported: No symptoms reported Respiratory Management Strategies: Routine screening  Endocrine Endocrine Symptoms Reported: No symptoms reported Is patient diabetic?: Yes List most recent blood sugar readings, include date and time of day: FBG today 96, no lows/highs    Gastrointestinal Gastrointestinal Symptoms Reported: No symptoms reported Gastrointestinal Management Strategies: Medication therapy Gastrointestinal Comment: Linzess     Genitourinary Genitourinary Symptoms Reported: No symptoms reported    Integumentary Integumentary Symptoms Reported: No symptoms reported    Musculoskeletal Musculoskelatal Symptoms Reviewed: Back pain, Difficulty walking, Muscle pain, Unsteady gait, Weakness Additional Musculoskeletal Details: reports fell in tub on Monday, hit tailbone, friend assisted up off floor, advised to have PCA help her in and out of shower moving forward, back pain 10/10 no leg pain Musculoskeletal Management Strategies: Medical device, Medication therapy, Routine screening Falls in the past year?: Yes Number of falls in past year: 2 or more Was there an injury with Fall?: Yes Fall Risk Category Calculator: 3 Patient Fall Risk Level: High Fall Risk Patient at Risk for Falls Due to: Impaired balance/gait, History of fall(s), Impaired mobility, Orthopedic patient Fall risk Follow up: Falls evaluation completed, Education provided  Psychosocial Psychosocial Symptoms Reported: Sadness - if selected complete PHQ 2-9 Additional Psychological Details: sadness, frustration wants surgery asap Behavioral Management Strategies: Support system Major Change/Loss/Stressor/Fears (CP): Medical condition, self, Resources      11/30/2024    PHQ2-9 Depression Screening   Little interest or pleasure in doing things Several days  Feeling down, depressed, or  hopeless Several days  PHQ-2 - Total Score 2   Trouble falling or staying asleep, or sleeping too much    Feeling tired or having little energy    Poor appetite or overeating     Feeling bad about yourself - or that you are a failure or have let yourself or your family down    Trouble concentrating on things, such as reading the newspaper or watching television    Moving or speaking so slowly that other people could have noticed.  Or the opposite - being so fidgety or restless that you have been moving around a lot more than usual    Thoughts that you would be better off dead, or hurting yourself in some way    PHQ2-9 Total Score    If you checked off any problems, how difficult have these problems made it for you to do your work, take care of things at home, or get along with other people    Depression Interventions/Treatment      There were no vitals filed for this visit.    Medications Reviewed Today     Reviewed by Devra Lands, RN (Registered Nurse) on 11/30/24 at 1419  Med List Status: <None>   Medication Order Taking? Sig Documenting Provider Last Dose Status Informant  amLODipine  (NORVASC ) 5 MG tablet [id number] Yes Take 1 tablet (5 mg total) by mouth daily. [name], Krichna, MD  Active   aspirin  EC 81 MG tablet [id number] Yes Take by mouth. [provider]  Active Self, Pharmacy Records  atorvastatin  (LIPITOR) 80 MG tablet [id number] Yes Take 1 tablet (80 mg total) by mouth daily. [name], Krichna, MD  Active   Blood Glucose Monitoring Suppl (ACCU-CHEK GUIDE) w/Device KIT [id number] Yes Use to check blood sugar 2 time daily as needed [name], Ibtehal Jaralla, MD  Active Self, Pharmacy Records  carvedilol  (COREG ) 12.5 MG tablet [id number] Yes TAKE 1 TABLET BY MOUTH 2 TIMES DAILY. Gerard Frederick, NP  Active Self, Pharmacy Records  Continuous Glucose Sensor (DEXCOM G7 SENSOR) [place] [id number] Yes 1 DEVICE BY DOES NOT APPLY ROUTE AS DIRECTED. [name], Donell Cardinal, MD  Active   ENTRESTO  24-26 MG [id number] Yes Take 1  tablet by mouth 2 (two) times daily. Gerard Frederick, NP  Active Self, Pharmacy Records  ezetimibe  (ZETIA ) 10 MG tablet [id number] Yes Take 1 tablet (10 mg total) by mouth daily. [name], Krichna, MD  Active   gabapentin  (NEURONTIN ) 600 MG tablet [id number] Yes Take 600 mg by mouth 3 (three) times daily. [provider]  Active   gabapentin  (NEURONTIN ) 800 MG tablet [id number]  Take 800 mg by mouth 3 (three) times daily.  Patient not taking: Reported on 11/14/2024   [provider]  Active Self, Pharmacy Records  Galcanezumab -gnlm (EMGALITY ) 120 MG/ML SOSY [id number] Yes Inject 1 mL into the skin every 30 (thirty) days. [name], Krichna, MD  Active Self, Pharmacy Records  glucose blood (ACCU-CHEK GUIDE) test strip [id number] Yes Check blood sugar 2 times daily as needed [name], Ibtehal Jaralla, MD  Active Self, Pharmacy Records  insulin  aspart (NOVOLOG ) 100 UNIT/ML injection [id number] Yes Inject 10 Units into the skin with breakfast, with lunch, and with evening meal. Wouk, Devaughn Sayres, MD  Active   Insulin  Glargine (BASAGLAR  [facility]) 100 UNIT/ML [id number] Yes Inject 70 Units into the skin daily. [name], Donell Cardinal, MD  Active Self, Pharmacy Records  Insulin  Pen Needle 32G X 4 MM MISC [id number] Yes 1 Device by Does not apply route in the morning, at  noon, in the evening, and at bedtime. [name], Donell Cardinal, MD  Active Self, Pharmacy Records  ipratropium (ATROVENT ) 0.03 % nasal spray [id number] Yes PLACE 2 SPRAYS INTO BOTH NOSTRILS 2 (TWO) TIMES DAILY [name], Krichna, MD  Active   JARDIANCE  25 MG TABS tablet [id number] Yes Take 1 tablet (25 mg total) by mouth daily. [name], Donell Cardinal, MD  Active Self, Pharmacy Records  Lancets [facility] MULTICLIX) lancets [id number] Yes Check sugar 2 times daily as needed. [name], Donell Cardinal, MD  Active Self, Pharmacy Records  levocetirizine (XYZAL ) 5 MG tablet [id number] Yes Take 1 tablet (5 mg total) by mouth in the  morning. [name], Krichna, MD  Active Self, Pharmacy Records  linaclotide  (LINZESS ) 290 MCG CAPS capsule [id number] Yes Take 1 capsule (290 mcg total) by mouth daily before breakfast. Therisa Bi, MD  Active Self, Pharmacy Records  MOUNJARO  7.5 MG/0.5ML Pen [id number] Yes Inject 7.5 mg into the skin once a week. [provider]  Active   naproxen  (EC NAPROSYN ) 500 MG EC tablet [id number]  Take 1 tablet (500 mg total) by mouth 2 (two) times daily with a meal.  Patient not taking: Reported on 11/16/2024   Janit Kast, PA-C  Active   nitroGLYCERIN  (NITROSTAT ) 0.4 MG SL tablet [id number] Yes PLACE 1 TABLET UNDER THE TONGUE EVERY 5 MINUTES AS NEEDED FOR CHEST PAIN. Darliss Rogue, MD  Active Self, Pharmacy Records  Rimegepant Sulfate  [facility]) 75 MG TBDP [id number] Yes Take 1 tablet (75 mg total) by mouth daily as needed. Max of every other day prn [name], Krichna, MD  Active Self, Pharmacy Records  spironolactone  (ALDACTONE ) 25 MG tablet [id number] Yes Take 1 tablet (25 mg total) by mouth daily. Darliss Rogue, MD  Active Self, Pharmacy Records  VENTOLIN  HFA [phone] Base) MCG/ACT inhaler [id number] Yes INHALE 2 PUFFS BY MOUTH EVERY 4 HOURS AS NEEDED FOR WHEEZE OR FOR SHORTNESS OF BREATH [name], Krichna, MD  Active             Recommendation:   PCP Follow-up Continue Current Plan of Care  Follow Up Plan:   Telephone follow-up in 1 month  [name], MSN, RN [facility]  [facility], [facility] RN Care Manager Direct Dial : [phone] Fax: [phone]

## 2024-12-01 ENCOUNTER — Other Ambulatory Visit: Payer: Self-pay | Admitting: Neurosurgery

## 2024-12-01 DIAGNOSIS — M4802 Spinal stenosis, cervical region: Secondary | ICD-10-CM | POA: Diagnosis not present

## 2024-12-01 DIAGNOSIS — M4712 Other spondylosis with myelopathy, cervical region: Secondary | ICD-10-CM | POA: Diagnosis not present

## 2024-12-01 DIAGNOSIS — M545 Low back pain, unspecified: Secondary | ICD-10-CM | POA: Diagnosis not present

## 2024-12-01 DIAGNOSIS — M40292 Other kyphosis, cervical region: Secondary | ICD-10-CM | POA: Diagnosis not present

## 2024-12-02 DIAGNOSIS — M545 Low back pain, unspecified: Secondary | ICD-10-CM | POA: Diagnosis not present

## 2024-12-03 DIAGNOSIS — M545 Low back pain, unspecified: Secondary | ICD-10-CM | POA: Diagnosis not present

## 2024-12-04 ENCOUNTER — Other Ambulatory Visit
Admission: RE | Admit: 2024-12-04 | Discharge: 2024-12-04 | Disposition: A | Discharge status: Home / Self Care | Attending: Family Medicine | Admitting: Family Medicine

## 2024-12-04 ENCOUNTER — Ambulatory Visit: Payer: Self-pay | Admitting: Family Medicine

## 2024-12-04 ENCOUNTER — Ambulatory Visit: Admitting: Primary Care

## 2024-12-04 ENCOUNTER — Ambulatory Visit: Admitting: Cardiology

## 2024-12-04 ENCOUNTER — Telehealth: Payer: Self-pay | Admitting: Neurosurgery

## 2024-12-04 DIAGNOSIS — E1159 Type 2 diabetes mellitus with other circulatory complications: Secondary | ICD-10-CM | POA: Insufficient documentation

## 2024-12-04 DIAGNOSIS — I209 Angina pectoris, unspecified: Secondary | ICD-10-CM | POA: Insufficient documentation

## 2024-12-04 DIAGNOSIS — I5022 Chronic systolic (congestive) heart failure: Secondary | ICD-10-CM

## 2024-12-04 DIAGNOSIS — N1831 Chronic kidney disease, stage 3a: Secondary | ICD-10-CM | POA: Diagnosis present

## 2024-12-04 DIAGNOSIS — M545 Low back pain, unspecified: Secondary | ICD-10-CM | POA: Diagnosis not present

## 2024-12-04 LAB — COMPREHENSIVE METABOLIC PANEL WITH GFR
ALT: 48 U/L — ABNORMAL HIGH (ref 0–44)
AST: 30 U/L (ref 15–41)
Albumin: 4.8 g/dL (ref 3.5–5.0)
Alkaline Phosphatase: 68 U/L (ref 38–126)
Anion gap: 15 (ref 5–15)
BUN: 26 mg/dL — ABNORMAL HIGH (ref 6–20)
CO2: 23 mmol/L (ref 22–32)
Calcium: 9.8 mg/dL (ref 8.9–10.3)
Chloride: 101 mmol/L (ref 98–111)
Creatinine, Ser: 1.13 mg/dL — ABNORMAL HIGH (ref 0.44–1.00)
GFR, Estimated: 57 mL/min — ABNORMAL LOW
Glucose, Bld: 228 mg/dL — ABNORMAL HIGH (ref 70–99)
Potassium: 4.6 mmol/L (ref 3.5–5.1)
Sodium: 139 mmol/L (ref 135–145)
Total Bilirubin: 1.2 mg/dL (ref 0.0–1.2)
Total Protein: 8 g/dL (ref 6.5–8.1)

## 2024-12-04 LAB — CBC WITH DIFFERENTIAL/PLATELET
Abs Immature Granulocytes: 0.04 K/uL (ref 0.00–0.07)
Basophils Absolute: 0 K/uL (ref 0.0–0.1)
Basophils Relative: 0 %
Eosinophils Absolute: 0.1 K/uL (ref 0.0–0.5)
Eosinophils Relative: 1 %
HCT: 33.5 % — ABNORMAL LOW (ref 36.0–46.0)
Hemoglobin: 10.8 g/dL — ABNORMAL LOW (ref 12.0–15.0)
Immature Granulocytes: 1 %
Lymphocytes Relative: 20 %
Lymphs Abs: 1.4 K/uL (ref 0.7–4.0)
MCH: 28 pg (ref 26.0–34.0)
MCHC: 32.2 g/dL (ref 30.0–36.0)
MCV: 86.8 fL (ref 80.0–100.0)
Monocytes Absolute: 0.5 K/uL (ref 0.1–1.0)
Monocytes Relative: 7 %
Neutro Abs: 4.8 K/uL (ref 1.7–7.7)
Neutrophils Relative %: 71 %
Platelets: 227 K/uL (ref 150–400)
RBC: 3.86 MIL/uL — ABNORMAL LOW (ref 3.87–5.11)
RDW: 13.9 % (ref 11.5–15.5)
WBC: 6.7 K/uL (ref 4.0–10.5)
nRBC: 0 % (ref 0.0–0.2)

## 2024-12-04 LAB — LIPID PANEL
Cholesterol: 140 mg/dL (ref 0–200)
HDL: 39 mg/dL — ABNORMAL LOW
LDL Cholesterol: 83 mg/dL (ref 0–99)
Total CHOL/HDL Ratio: 3.6 ratio
Triglycerides: 91 mg/dL
VLDL: 18 mg/dL (ref 0–40)

## 2024-12-04 NOTE — Telephone Encounter (Signed)
 Noted, lab results are pending in the chart-once finalized need to route this note to provider

## 2024-12-04 NOTE — Telephone Encounter (Signed)
 Pt called into office to let us  know that she just had all her labs done at her PCP that Dr. Claudene wanted her to have before her surgery.

## 2024-12-05 LAB — HEMOGLOBIN A1C
Hgb A1c MFr Bld: 8 % — ABNORMAL HIGH (ref 4.8–5.6)
Mean Plasma Glucose: 182.9 mg/dL

## 2024-12-05 LAB — FRUCTOSAMINE: Fructosamine: 438 umol/L — ABNORMAL HIGH (ref 0–285)

## 2024-12-05 LAB — MICROALBUMIN / CREATININE URINE RATIO
Creatinine, Urine: 64.2 mg/dL
Microalb Creat Ratio: 13 mg/g{creat} (ref 0–29)
Microalb, Ur: 8.1 ug/mL — ABNORMAL HIGH

## 2024-12-05 NOTE — Telephone Encounter (Signed)
 A1c is 8.0. Fructosamine is 438.

## 2024-12-08 ENCOUNTER — Telehealth: Admitting: *Deleted

## 2024-12-08 ENCOUNTER — Ambulatory Visit

## 2024-12-11 NOTE — Telephone Encounter (Signed)
 Pt is wanting to know next steps for the sx. Please advise

## 2024-12-12 ENCOUNTER — Other Ambulatory Visit: Payer: Self-pay | Admitting: Family Medicine

## 2024-12-12 DIAGNOSIS — J111 Influenza due to unidentified influenza virus with other respiratory manifestations: Secondary | ICD-10-CM

## 2024-12-13 NOTE — Telephone Encounter (Signed)
 Requested medication (s) are due for refill today: no  Requested medication (s) are on the active medication list: no  Last refill:  09/11/24  Future visit scheduled: yes  Notes to clinic:  Medication not assigned to a protocol, review manually.      Requested Prescriptions  Pending Prescriptions Disp Refills   oseltamivir  (TAMIFLU ) 75 MG capsule [Pharmacy Med Name: OSELTAMIVIR  PHOS 75 MG CAPSULE] 10 capsule 0    Sig: TAKE 1 CAPSULE BY MOUTH TWICE A DAY     Off-Protocol Failed - 12/13/2024 11:44 AM      Failed - Medication not assigned to a protocol, review manually.      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)   [place] [facility] [name], Krichna, MD   9 months ago Hypertension associated with diabetes [facility])   [facility] [name], Krichna, MD

## 2024-12-19 NOTE — Progress Notes (Signed)
 " [facility] - Clinic Note  01/02/2025   CHIEF COMPLAINT Patient presents for Retina Follow Up  HISTORY OF PRESENT ILLNESS: Sarah Duffy is a 54 y.o. female who presents to the clinic today for:  HPI     Retina Follow Up   Patient presents with  Diabetic Retinopathy.  In both eyes.  This started 6 months ago.  Duration of 6 months.  Since onset it is stable.  I, the attending physician,  performed the HPI with the patient and updated documentation appropriately.        Comments   Pt states vision has been more hazy in the right eye and is having more difficulty seeing computer and phone screens. Pt states she had some burning in her right eye when she goes to bed, similar to shampoo in the eye. Pt denies FOL/floaters. Pt is scheduled for ACDF neck sx on Thursday. BS=128 this morning. A1c=8.4 12/04/2024      Last edited by Valdemar Rogue, MD on 01/02/2025 10:20 PM.    Pt states she has to have neck surgery on Thursday. Started in her lower back but has gotten worse. VA in OD has been decreased since end of October. OD is fuzzy, like theres film over it. Pt has been on baby aspirin  daily   Referring physician: [name], Krichna, MD [address] [place],  [place] [address]  HISTORICAL INFORMATION:  Selected notes from the MEDICAL RECORD NUMBER Referred by [place], PA-C for diabetic eye exam LEE:  Ocular Hx- PDR OU, previously managed by [facility]-   CURRENT MEDICATIONS: No current outpatient medications on file. (Ophthalmic Drugs)   No current facility-administered medications for this visit. (Ophthalmic Drugs)   Current Outpatient Medications (Other)  Medication Sig   amLODipine  (NORVASC ) 5 MG tablet Take 1 tablet (5 mg total) by mouth daily.   aspirin  EC 81 MG tablet Take 81 mg by mouth daily.   atorvastatin  (LIPITOR) 80 MG tablet Take 1 tablet (80 mg total) by mouth daily.   Blood Glucose Monitoring Suppl (ACCU-CHEK GUIDE)  w/Device KIT Use to check blood sugar 2 time daily as needed   carvedilol  (COREG ) 12.5 MG tablet TAKE 1 TABLET BY MOUTH 2 TIMES DAILY.   Continuous Glucose Sensor (DEXCOM G7 SENSOR) MISC 1 DEVICE BY DOES NOT APPLY ROUTE AS DIRECTED.   ENTRESTO  24-26 MG Take 1 tablet by mouth 2 (two) times daily.   ezetimibe  (ZETIA ) 10 MG tablet Take 1 tablet (10 mg total) by mouth daily.   gabapentin  (NEURONTIN ) 600 MG tablet Take 600 mg by mouth 3 (three) times daily.   Galcanezumab -gnlm (EMGALITY ) 120 MG/ML SOSY Inject 1 mL into the skin every 30 (thirty) days.   glucose blood (ACCU-CHEK GUIDE) test strip Check blood sugar 2 times daily as needed   insulin  aspart (NOVOLOG ) 100 UNIT/ML injection Inject 10 Units into the skin with breakfast, with lunch, and with evening meal. (Patient taking differently: Inject 15 Units into the skin with breakfast, with lunch, and with evening meal.)   Insulin  Glargine (BASAGLAR  KWIKPEN) 100 UNIT/ML Inject 70 Units into the skin daily. (Patient taking differently: Inject 70 Units into the skin at bedtime.)   Insulin  Pen Needle 32G X 4 MM MISC 1 Device by Does not apply route in the morning, at noon, in the evening, and at bedtime.   ipratropium (ATROVENT ) 0.03 % nasal spray PLACE 2 SPRAYS INTO BOTH NOSTRILS 2 (TWO) TIMES DAILY   JARDIANCE   25 MG TABS tablet Take 1 tablet (25 mg total) by mouth daily.   Lancets (ACCU-CHEK MULTICLIX) lancets Check sugar 2 times daily as needed.   levocetirizine (XYZAL ) 5 MG tablet Take 1 tablet (5 mg total) by mouth in the morning.   linaclotide  (LINZESS ) 290 MCG CAPS capsule Take 1 capsule (290 mcg total) by mouth daily before breakfast.   metFORMIN  (GLUCOPHAGE -XR) 500 MG 24 hr tablet Take 1,000 mg by mouth 2 (two) times daily.   MOUNJARO  10 MG/0.5ML Pen Inject 10 mg into the skin once a week.   naproxen  (EC NAPROSYN ) 500 MG EC tablet Take 1 tablet (500 mg total) by mouth 2 (two) times daily with a meal. (Patient not taking: Reported on 12/26/2024)    nitroGLYCERIN  (NITROSTAT ) 0.4 MG SL tablet PLACE 1 TABLET UNDER THE TONGUE EVERY 5 MINUTES AS NEEDED FOR CHEST PAIN.   Rimegepant Sulfate  (NURTEC) 75 MG TBDP Take 1 tablet (75 mg total) by mouth daily as needed. Max of every other day prn   spironolactone  (ALDACTONE ) 25 MG tablet Take 1 tablet (25 mg total) by mouth daily.   VENTOLIN  HFA 108 (90 Base) MCG/ACT inhaler INHALE 2 PUFFS BY MOUTH EVERY 4 HOURS AS NEEDED FOR WHEEZE OR FOR SHORTNESS OF BREATH   No current facility-administered medications for this visit. (Other)   REVIEW OF SYSTEMS: ROS   Positive for: Endocrine, Cardiovascular, Eyes, Respiratory, Psychiatric Last edited by Elnor Avelina RAMAN, COT on 01/02/2025  1:13 PM.      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 02/2021 Echo: EF 35-40%, glob HK, GrI DD, nl RV fxn, mildly dil LA, mod MR; b. 06/2022 Echo: EF 50-55%, no rwma, GrII DD, nl RV fxn, RVSP , mildly dil LA, mild MR, AoV sclerosis.   CKD (chronic kidney disease), stage III (HCC)    Complication of anesthesia 03/23/2023   Possible gastroparesis based off food in stomach during EGD   Coronary artery disease    a. 04/2021 Cath: LM nl, LAD 70p/m, 4m, 80d, LCX 67m, OM1 40, OM2 99 (fills via collats from OM1), RCA 60d, RPDA 75.   COVID-19 2021   Depression    Hyperlipidemia LDL goal <70    Hypertension    Insomnia    Ischemic cardiomyopathy    a. 02/2021 Echo: EF 35-40%, glob HK, GrI DD; b. 06/2022 Echo: EF 50-55%.   Lumbar spinal stenosis    Migraines    Moderate mitral regurgitation    a. 02/2021 Echo: Mod MR.   MRSA (methicillin resistant Staphylococcus aureus) 02/25/2016   OSA on CPAP    Osteomyelitis of foot (HCC) 08/26/2016   Proliferative diabetic retinopathy of both eyes  associated with type 2 diabetes mellitus (HCC)    Type II diabetes mellitus (HCC)    Past Surgical History:  Procedure Laterality Date   CHOLECYSTECTOMY  1999   COLONOSCOPY WITH PROPOFOL  N/A 03/25/2021   Procedure: COLONOSCOPY WITH PROPOFOL ;  Surgeon: Therisa Bi, MD;  Location[facility] ENDOSCOPY;  Service: Gastroenterology;  Laterality: N/A;   COLONOSCOPY WITH PROPOFOL  N/A 04/17/2021   Procedure: COLONOSCOPY WITH PROPOFOL ;  Surgeon: Therisa Bi, MD;  Location: [facility] ENDOSCOPY;  Service: Gastroenterology;  Laterality: N/A;   ESOPHAGOGASTRODUODENOSCOPY (EGD) WITH PROPOFOL  N/A 03/25/2021  Procedure: ESOPHAGOGASTRODUODENOSCOPY (EGD) WITH PROPOFOL ;  Surgeon: Therisa Bi, MD;  Location[facility] ENDOSCOPY;  Service: Gastroenterology;  Laterality: N/A;   ESOPHAGOGASTRODUODENOSCOPY (EGD) WITH PROPOFOL  N/A 04/17/2021   Procedure: ESOPHAGOGASTRODUODENOSCOPY (EGD) WITH PROPOFOL ;  Surgeon: Therisa Bi, MD;  Location: [facility] ENDOSCOPY;  Service: Gastroenterology;  Laterality: N/A;   ESOPHAGOGASTRODUODENOSCOPY (EGD) WITH PROPOFOL  N/A 03/23/2023   Procedure: ESOPHAGOGASTRODUODENOSCOPY (EGD) WITH PROPOFOL ;  Surgeon: Therisa Bi, MD;  Location: [facility] ENDOSCOPY;  Service: Gastroenterology;  Laterality: N/A;   RIGHT/LEFT HEART CATH AND CORONARY ANGIOGRAPHY Bilateral 04/14/2021   Procedure: RIGHT/LEFT HEART CATH AND CORONARY ANGIOGRAPHY;  Surgeon: Mady Bruckner, MD;  Location: [facility] INVASIVE CV LAB;  Service: Cardiovascular;  Laterality: Bilateral;   TOE SURGERY Left 02/07/2016   Pinky Toe   FAMILY HISTORY Family History  Problem Relation Age of Onset   Diabetes Mother    Ulcers Mother    Heart disease Father    AAA (abdominal aortic aneurysm) Father    Diabetes Father    Hypertension Father    Stroke Father    Alzheimer's disease Father    Heart attack Sister    Seizures Brother    Diabetes Maternal Grandmother    Breast cancer Maternal Grandmother    SOCIAL HISTORY Social History   Tobacco Use   Smoking  status: Never   Smokeless tobacco: Never  Vaping Use   Vaping status: Never Used  Substance Use Topics   Alcohol use: Never   Drug use: Never       OPHTHALMIC EXAM:  Base Eye Exam     Visual Acuity (Snellen - Linear)       Right Left   Dist [place] 20/800 20/60 +1   Dist ph [place] 20/400 -2 20/30 -2         Tonometry (Tonopen, 1:24 PM)       Right Left   Pressure 13 12         Pupils       Pupils Dark Light Shape React APD   Right PERRL 3 2 Round Brisk None   Left PERRL 3 2 Round Brisk None         Visual Fields       Left Right    Full Full         Extraocular Movement       Right Left    Full, Ortho Full, Ortho         Neuro/Psych     Oriented x3: Yes   Mood/Affect: Normal         Dilation     Both eyes: 1.0% Mydriacyl, 2.5% Phenylephrine  @ 1:25 PM           Slit Lamp and Fundus Exam     Slit Lamp Exam       Right Left   Lids/Lashes Dermatochalasis Normal   Conjunctiva/Sclera mild melanosis mild melanosis   Cornea mild arcus, mild tear film debris Trace tear film debris   Anterior Chamber deep and clear deep and clear   Iris Round and dilated, No NVI Round and dilated, No NVI   Lens 1+ Nuclear sclerosis, 1-2+ Cortical cataract 1+ Nuclear sclerosis, 1-2+ Cortical cataract   Anterior Vitreous Vitreous syneresis, +RBCs, diffuse VH, scattered fibrosis, blood clots settled inferiorly Vitreous syneresis, +RBCs         Fundus Exam       Right Left   Disc Very hazy view, perfused Pink and Sharp, no NVD   C/D Ratio  0.2   Macula  No view, obscured by heme Flat, Blunted foveal reflex, scattered MA, DBH and exudates greatest temporal macula   Vessels No view attenuated, Tortuous, +NVE   Periphery Very hazy view, grossly attached superiorly, scattered fibrosis Attached, scattered MA / DBH, good PRP 360 with room for fill in            Refraction     Manifest Refraction (Subjective)       Sphere Cylinder Axis Dist VA   Right -0.75  +100.00 010 20/400-1   Left               IMAGING AND PROCEDURES  Imaging and Procedures for 01/02/2025  OCT, Retina - OU - Both Eyes       Right Eye Quality was poor. Progression has worsened. Findings include (Only line images obtained, grossly attached, diffuse vitreous opacities).   Left Eye Quality was good. Central Foveal Thickness: 250. Progression has worsened. Findings include normal foveal contour, no SRF, intraretinal hyper-reflective material, intraretinal fluid, vitreomacular adhesion (mild scattered cystic changes greatest temporal macula ).   Notes *Images captured and stored on drive  Diagnosis / Impression:  +DME OU OD: Only line images obtained, grossly attached, diffuse vitreous opacities OS: mild scattered cystic changes greatest temporal mac  Clinical management:  See below  Abbreviations: NFP - Normal foveal profile. CME - cystoid macular edema. PED - pigment epithelial detachment. IRF - intraretinal fluid. SRF - subretinal fluid. EZ - ellipsoid zone. ERM - epiretinal membrane. ORA - outer retinal atrophy. ORT - outer retinal tubulation. SRHM - subretinal hyper-reflective material. IRHM - intraretinal hyper-reflective material      Intravitreal Injection, Pharmacologic Agent - OS - Left Eye       Time Out 01/02/2025. 2:13 PM. Confirmed correct patient, procedure, site, and patient consented.   Anesthesia Topical anesthesia was used. Anesthetic medications included Lidocaine  4%, Proparacaine 0.5%.   Procedure Preparation included 5% betadine to ocular surface, eyelid speculum. A (32g) needle was used.   Injection: 1.25 mg Bevacizumab  1.25mg /0.71ml   Route: Intravitreal, Site: Left Eye   NDC: C2662926, Lot: [id number], Expiration date: 11/01/2025   Post-op Post injection exam found visual acuity of at least counting fingers. The patient tolerated the procedure well. There were no complications. The patient received written and verbal post  procedure care education. Post injection medications were not given.      Intravitreal Injection, Pharmacologic Agent - OD - Right Eye       Time Out 01/02/2025. 2:13 PM. Confirmed correct patient, procedure, site, and patient consented.   Anesthesia Topical anesthesia was used. Anesthetic medications included Lidocaine  2%, Proparacaine 0.5%.   Procedure Preparation included 5% betadine to ocular surface, eyelid speculum. A (32g) needle was used.   Injection: 1.25 mg Bevacizumab  1.25mg /0.15ml   Route: Intravitreal, Site: Right Eye   NDC: C2662926, Lot: [id number], Expiration date: 11/01/2025   Post-op Post injection exam found visual acuity of at least counting fingers. The patient tolerated the procedure well. There were no complications. The patient received written and verbal post procedure care education. Post injection medications were not given.            ASSESSMENT/PLAN:   ICD-10-CM   1. 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 edema associated with type 2 diabetes mellitus (HCC)  E11.3513 OCT, Retina - OU - Both Eyes    Intravitreal Injection, Pharmacologic Agent - OS - Left Eye    Intravitreal Injection, Pharmacologic Agent - OD - Right Eye  Bevacizumab  (AVASTIN ) SOLN 1.25 mg    2. Encounter for long-term (current) use of insulin  (HCC)  Z79.4     3. Long term (current) use of oral hypoglycemic drugs  Z79.84     4. Vitreous hemorrhage, right eye (HCC)  H43.11 OCT, Retina - OU - Both Eyes    5. Essential hypertension  I10     6. Hypertensive retinopathy of both eyes  H35.033     7. Combined forms of age-related cataract of both eyes  H25.813      1-4. Proliferative diabetic retinopathy, both eyes  - lost to f/u from 06.03.25-01.20.26 (7+ mos due to back / spine ailment)  - lost to f/u from 12.10.24 to 06.03.25   - previously managed by [facility]  - h/o anti-VEGF therapy and PRP OS  - last A1c 8.0 on 12.22.25, 8.4 on  07.08.24  - s/p IVA OU #1 (11.05.24), #2 (12.03.24), #3 (06.03.25)  - s/p PRP OS (11.18.24)  - s/p PRP OD (12.10.24) - exam shows new VH / blood stained vit condensations and tractional fibrosis OD; scattered MA/DBH OU - FA (11.05.24) shows OD: Scattered punctate leaking NV, scattered blockage from [facility], scattered patches of vascular non-perfusion, leaking MA; OS: Scattered leaking NV -- greatest SN midzone, scattered patches of vascular non-perfusion, leaking MA, scattered incomplete PRP - BCVA OD 20/400 from 20/80; OS 20/30 -- stable - OCT shows OD: Only single line images obtained, grossly attached, diffuse vitreous opacities; OS: mild scattered cystic changes greatest nasal fovea and macula at 7+ mos since last injxn OU - recommend IVA OU #4 today 01.20.26 w/ f/u in 4 weeks - pt wishes to proceed with injections - RBA of procedure discussed, questions answered - informed consent obtained and signed - see procedure note - f/u 4 weeks -- DFE/OCT, possibl Bscan OD; possible injxn  5,6. Hypertensive retinopathy OU - discussed importance of tight BP control - monitor  7. Mixed Cataract OU - The symptoms of cataract, surgical options, and treatments and risks were discussed with patient. - discussed diagnosis and progression - monitor  Ophthalmic Meds Ordered this visit:  Meds ordered this encounter  Medications   Bevacizumab  (AVASTIN ) SOLN 1.25 mg     Return in about 4 weeks (around 01/30/2025) for PDR OU, DFE, OCT, Bscan OD, likely IVA OU.  There are no Patient Instructions on file for this visit.  Explained the diagnoses, plan, and follow up with the patient and they expressed understanding.  Patient expressed understanding of the importance of proper follow up care.   This document serves as a record of services personally performed by Redell JUDITHANN Hans, MD, PhD. It was created on their behalf by Wanda GEANNIE Keens, COT an ophthalmic technician. The creation of this record is the  provider's dictation and/or activities during the visit.    Electronically signed by:  Wanda GEANNIE Keens, COT  01/02/25 10:50 PM  This document serves as a record of services personally performed by Redell JUDITHANN Hans, MD, PhD. It was created on their behalf by Almetta Pesa, an ophthalmic technician. The creation of this record is the provider's dictation and/or activities during the visit.    Electronically signed by: Almetta Pesa, OA, 01/02/25  10:50 PM  Redell JUDITHANN Hans, M.D., Ph.D. Diseases & Surgery of the Retina and Vitreous [facility] 01/02/2025  I have reviewed the above documentation for accuracy and completeness, and I agree with the above. Redell JUDITHANN Hans, M.D., Ph.D. 01/02/25  10:54 PM   Abbreviations: M myopia (nearsighted); A astigmatism; H hyperopia (farsighted); P presbyopia; Mrx spectacle prescription;  CTL contact lenses; OD right eye; OS left eye; OU both eyes  XT exotropia; ET esotropia; PEK punctate epithelial keratitis; PEE punctate epithelial erosions; DES dry eye syndrome; MGD meibomian gland dysfunction; ATs artificial tears; PFAT's preservative free artificial tears; NSC nuclear sclerotic cataract; PSC posterior subcapsular cataract; ERM epi-retinal membrane; PVD posterior vitreous detachment; RD retinal detachment; DM diabetes mellitus; DR diabetic retinopathy; NPDR non-proliferative diabetic retinopathy; PDR proliferative diabetic retinopathy; CSME clinically significant macular edema; DME diabetic macular edema; dbh dot blot hemorrhages; CWS cotton wool spot; POAG primary open angle glaucoma; C/D cup-to-disc ratio; HVF humphrey visual field; GVF goldmann visual field; OCT optical coherence tomography; IOP intraocular pressure; BRVO Branch retinal vein occlusion; CRVO central retinal vein occlusion; CRAO central retinal artery occlusion; BRAO branch retinal artery occlusion; RT retinal tear; SB scleral buckle; PPV pars plana vitrectomy; VH  Vitreous hemorrhage; PRP panretinal laser photocoagulation; IVK intravitreal kenalog ; VMT vitreomacular traction; MH Macular hole;  NVD neovascularization of the disc; NVE neovascularization elsewhere; AREDS age related eye disease study; ARMD age related macular degeneration; POAG primary open angle glaucoma; EBMD epithelial/anterior basement membrane dystrophy; ACIOL anterior chamber intraocular lens; IOL intraocular lens; PCIOL posterior chamber intraocular lens; Phaco/IOL phacoemulsification with intraocular lens placement; PRK photorefractive keratectomy; LASIK laser assisted in situ keratomileusis; HTN hypertension; DM diabetes mellitus; COPD chronic obstructive pulmonary disease  "

## 2024-12-20 ENCOUNTER — Other Ambulatory Visit: Payer: Self-pay

## 2024-12-20 ENCOUNTER — Telehealth (HOSPITAL_BASED_OUTPATIENT_CLINIC_OR_DEPARTMENT_OTHER): Payer: Self-pay | Admitting: *Deleted

## 2024-12-20 ENCOUNTER — Encounter: Payer: Self-pay | Admitting: Neurosurgery

## 2024-12-20 ENCOUNTER — Ambulatory Visit: Admitting: Neurosurgery

## 2024-12-20 ENCOUNTER — Ambulatory Visit: Payer: Self-pay | Admitting: Neurosurgery

## 2024-12-20 ENCOUNTER — Ambulatory Visit
Admission: RE | Admit: 2024-12-20 | Discharge: 2024-12-20 | Disposition: A | Discharge status: Home / Self Care | Source: Ambulatory Visit | Attending: Neurosurgery | Admitting: Neurosurgery

## 2024-12-20 VITALS — BP 100/60 | Wt 181.0 lb

## 2024-12-20 DIAGNOSIS — G8929 Other chronic pain: Secondary | ICD-10-CM | POA: Diagnosis not present

## 2024-12-20 DIAGNOSIS — R2681 Unsteadiness on feet: Secondary | ICD-10-CM

## 2024-12-20 DIAGNOSIS — R2689 Other abnormalities of gait and mobility: Secondary | ICD-10-CM

## 2024-12-20 DIAGNOSIS — M4722 Other spondylosis with radiculopathy, cervical region: Secondary | ICD-10-CM | POA: Insufficient documentation

## 2024-12-20 DIAGNOSIS — M545 Low back pain, unspecified: Secondary | ICD-10-CM | POA: Diagnosis not present

## 2024-12-20 DIAGNOSIS — G959 Disease of spinal cord, unspecified: Secondary | ICD-10-CM | POA: Diagnosis not present

## 2024-12-20 DIAGNOSIS — M4712 Other spondylosis with myelopathy, cervical region: Secondary | ICD-10-CM

## 2024-12-20 DIAGNOSIS — R269 Unspecified abnormalities of gait and mobility: Secondary | ICD-10-CM

## 2024-12-20 DIAGNOSIS — Z01818 Encounter for other preprocedural examination: Secondary | ICD-10-CM

## 2024-12-20 NOTE — Patient Instructions (Addendum)
 Please see below for information in regards to your upcoming surgery:   Planned surgery: C5-7 Anterior Cervical Discectomy and Fusion   Surgery date: 02/06/25 at [facility] (Medical Mall: [address], [place], [place] [address]) - you will find out your arrival time the business day before your surgery. *Addended date on 01/03/25  Pre-op appointment at [facility] Pre-admit Testing: you will receive a call with a date/time for this appointment. If you are scheduled for an in person appointment, Pre-admit Testing is located on the first floor of the [facility] building, [address], [address]. During this appointment, they will advise you which medications you can take the morning of surgery, and which medications you will need to hold for surgery. Labs (such as blood work, EKG) may be done at your pre-op appointment. You are not required to fast for these labs. Should you need to change your pre-op appointment, please call Pre-admit testing at [phone].   Please bring your medication bottles or an up to date medication list to your pre-admit testing appointment (regardless of whether we have a list in your chart).     Blood thinners:   Aspirin  81mg :  if taking as a preventative, stop aspirin  7 days prior, resume aspirin  14 days after   Diabetes/heart failure/kidney disease/weight loss medications that require an extended hold: Per anesthesia guidelines (due to the increased risk of aspiration caused by delayed gastric emptying):  Metformin : hold for 2 days prior to surgery Empagliflozin  (Jardiance ): hold for 3 days prior to surgery Tirzepatide  (Mounjaro ) injection: hold for 7 days prior to surgery   Surgical clearance: we will send a clearance form to Dorette Loron, MD & [facility]. They may wish to see you in their office prior to signing the clearance form. If so, they may call you to schedule an appointment.    NSAIDS  (Non-steroidal anti-inflammatory drugs): because you are having a fusion, please avoid taking any NSAIDS (examples: ibuprofen, motrin, aleve , naproxen , meloxicam, diclofenac ) for 3 months after surgery. Celebrex is an exception and is OK to take, if prescribed. Tylenol  is not an NSAID.    Common restrictions after spine surgery: No bending, lifting, or twisting (BLT). Avoid lifting objects heavier than 10 pounds for the first 6 weeks after surgery. Where possible, avoid household activities that involve lifting, bending, reaching, pushing, or pulling such as laundry, vacuuming, grocery shopping, and childcare. Try to arrange for help from friends and family for these activities while you heal. Do not drive while taking prescription pain medication. Weeks 6 through 12 after surgery: avoid lifting more than 25 pounds.    X-rays after surgery: Because you are having a fusion or arthroplasty: for appointments after your 2 week follow-up: please arrive our office 30 minutes prior to your appointment for x-rays. This applies to every appointment after your 2 week follow-up. Failure to do so may result in your appointment being rescheduled.     How to contact us :  If you have any questions/concerns before or after surgery, you can reach us  at [phone], or you can send a mychart message. We can be reached by phone or mychart 8am-4pm, Monday-Friday.  *Please note: Calls after 4pm are forwarded to a third party answering service. Mychart messages are not routinely monitored during evenings, weekends, and holidays. Please call our office to contact the answering service for urgent concerns during non-business hours.     If you have FMLA/disability paperwork, please drop it off or  fax it to [phone]   Appointments/FMLA & disability paperwork: Reche Hait, & Nichole Registered Nurse/Surgery scheduler: Kendelyn, RN & Katie, RN Certified Medical Assistants: Don, CMA, Elenor, CMA, Damien,  CMA, & Auston, [place] Physician Assistants: Lyle Decamp, PA-C, Edsel Goods, PA-C & Glade Boys, PA-C Surgeons: Penne Sharps, MD & Reeves Daisy, MD   [facility] PREADMIT TESTING VISIT and SURGERY INFORMATION SHEET   Now that surgery has been scheduled you can anticipate several phone calls from [facility] services. A pharmacy technician will call you to verify your current list of medications taken at home.               The Pre-Service Center will call to verify your insurance information and to give you billing estimates and information.             The Preadmit Testing Office will be calling to schedule a visit to obtain information for the anesthesia team and provide instructions on preparation for surgery.  What can you expect for the Preadmit Testing Visit: Appointments may be scheduled in-person or by telephone.  If a telephone visit is scheduled, you may be asked to come into the office to have lab tests or other studies performed.   This visit will not be completed any greater than 14 days prior to your surgery.  If your surgery has been scheduled for a future date, please do not be alarmed if we have not contacted you to schedule an appointment more than a month prior to the surgery date.    Please be prepared to provide the following information during this appointment:            -Personal medical history                                               -Medication and allergy list            -Any history of problems with anesthesia              -Recent lab work or diagnostic studies            -Please notify us  of any needs we should be aware of to provide the best care possible           -You will be provided with instructions on how to prepare for your surgery.    On The Day of Surgery:  You must have a driver to take you home after surgery, you will be asked not to drive for 24 hours following surgery.  Taxi, Gisele and non-medical transport will not  be acceptable means of transportation unless you have a responsible individual who will be traveling with you.  Visitors in the surgical area:   2 people will be able to visit you in your room once your preparation for surgery has been completed. During surgery, your visitors will be asked to wait in the Surgery Waiting Area.  It is not a requirement for them to stay, if they prefer to leave and come back.  Your visitor(s) will be given an update once the surgery has been completed.  No visitors are allowed in the initial recovery room to respect patient privacy and safety.  Once you are more awake and transfer to the secondary recovery area, or are transferred to an inpatient room, visitors will again  be able to see you.  To respect and protect your privacy: We will ask on the day of surgery who your driver will be and what the contact number for that individual will be. We will ask if it is okay to share information with this individual, or if there is an alternative individual that we, or the surgeon, should contact to provide updates and information. If family or friends come to the surgical information desk requesting information about you, who you have not listed with us , no information will be given.   It may be helpful to designate someone as the main contact who will be responsible for updating your other friends and family.    PREADMIT TESTING OFFICE: [phone] SAME DAY SURGERY: [phone] We look forward to caring for you before and throughout the process of your surgery.

## 2024-12-20 NOTE — Telephone Encounter (Signed)
" ° °  Name: Sarah Duffy  DOB: Mar 15, 1971  MRN: [id number]  Primary Cardiologist: Redell Cave, MD  Chart reviewed as part of pre-operative protocol coverage. Because of [name]'s past medical history and time since last visit, she will require a follow-up in-office visit in order to better assess preoperative cardiovascular risk. Patient already has an appointment with [name] NP on 12/21/24.   Pre-op covering staff: - Please schedule appointment and call patient to inform them. If patient already had an upcoming appointment within acceptable timeframe, please add pre-op clearance to the appointment notes so provider is aware. - Please contact requesting surgeon's office via preferred method (i.e, phone, fax) to inform them of need for appointment prior to surgery.     Rollo FABIENE Louder, PA-C  12/20/2024, 11:06 AM   "

## 2024-12-20 NOTE — Telephone Encounter (Signed)
"  ° °  Pre-operative Risk Assessment    Patient Name: Sarah Duffy  DOB: [birth year] MRN: [id number]   Date of last office visit: 04/13/25 SHERI HAMMOCK, NP Date of next office visit: 12/21/24 SHERI HAMMOCK, NP   Request for Surgical Clearance    Procedure:  C5-7 ANTERIOR CERVICAL DISCECTOMY AND FUSION  Date of Surgery:  Clearance 01/04/25                                Surgeon:  DR. PENNE SHARPS Surgeon's Group or Practice Name:  [facility]  Phone number:  [phone] Fax number:  [phone]   Type of Clearance Requested:   - Medical  - Pharmacy:  Hold Aspirin  x 7 DAYS PRIOR; (FORM ALSO STATES NEED HOLD FOR METFORMIN  x 2 DAYS, JARDIANCE  x 3 DAYS AND MOUNJARO  INJECTION  x 7 DAYS PRIOR)   Type of Anesthesia:  General    Additional requests/questions:    Sarah Duffy   12/20/2024, 10:52 AM   "

## 2024-12-20 NOTE — Telephone Encounter (Signed)
 Pt has appt 12/21/24 with [name], NP.

## 2024-12-20 NOTE — Progress Notes (Signed)
 "  Referring Physician:  [name], Krichna, MD [address] [place],  [place] [address]  Primary Physician:  Glenard Mire, MD  Discussed the use of AI scribe software for clinical note transcription with the patient, who gave verbal consent to proceed.  History of Present Illness Sarah Duffy is a 54 year old female with cervical myelopathy who presents for evaluation of progressive gait dysfunction and worsening extremity weakness. Over the past several months, she has had progressively worsening gait dysfunction and lower extremity weakness with increasing difficulty ambulating. Weakness in her hips and ankles is worsening, and declining hand strength and grip now interfere with daily activities  Neck symptoms began about eight months ago and have progressed with worsening reflexes and more pronounced upper and lower extremity weakness. She has not had prior cervical spine surgery. Type 2 diabetes complicates surgical planning. Her hemoglobin A1c is about 8 with improving glycemic control, but she notes her blood glucose remains higher than desired for surgery and is concerned about ongoing neurologic decline. She is working with endocrinology and plans to start an Omnipod insulin  pump after carbohydrate counting education. Chronic low back pain persists and is treated with steroid injections. She asks about the safety and timing of additional lumbar injections before a planned cervical procedure.  Review of Systems:  A 10 point review of systems is negative, except for the pertinent positives and negatives detailed in the HPI.  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 02/2021 Echo: EF 35-40%, glob HK, GrI DD, nl RV fxn, mildly dil LA, mod MR; b. 06/2022 Echo: EF 50-55%, no rwma, GrII DD, nl RV fxn, RVSP , mildly dil LA, mild MR, AoV sclerosis.   CKD (chronic kidney disease), stage  III (HCC)    Complication of anesthesia 03/23/2023   Possible gastroparesis based off food in stomach during EGD   Coronary artery disease    a. 04/2021 Cath: LM nl, LAD 70p/m, 25m, 80d, LCX 19m, OM1 40, OM2 99 (fills via collats from OM1), RCA 60d, RPDA 75.   COVID-19 2021   Hyperlipidemia LDL goal <70    Hypertension    Ischemic cardiomyopathy    a. 02/2021 Echo: EF 35-40%, glob HK, GrI DD; b. 06/2022 Echo: EF 50-55%.   Moderate mitral regurgitation    a. 02/2021 Echo: Mod MR.   MRSA infection within last 3 months 02/25/2016   Osteomyelitis of foot (HCC) 08/26/2016   Type II diabetes mellitus (HCC)     Past Surgical History: Past Surgical History:  Procedure Laterality Date   CHOLECYSTECTOMY  1999   COLONOSCOPY WITH PROPOFOL  N/A 03/25/2021   Procedure: COLONOSCOPY WITH PROPOFOL ;  Surgeon: Therisa Bi, MD;  Location: [facility] ENDOSCOPY;  Service: Gastroenterology;  Laterality: N/A;   COLONOSCOPY WITH PROPOFOL  N/A 04/17/2021   Procedure: COLONOSCOPY WITH PROPOFOL ;  Surgeon: Therisa Bi, MD;  Location: [facility] ENDOSCOPY;  Service: Gastroenterology;  Laterality: N/A;   ESOPHAGOGASTRODUODENOSCOPY (EGD) WITH PROPOFOL  N/A 03/25/2021   Procedure: ESOPHAGOGASTRODUODENOSCOPY (EGD) WITH PROPOFOL ;  Surgeon: Therisa Bi, MD;  Location: [facility] ENDOSCOPY;  Service: Gastroenterology;  Laterality: N/A;   ESOPHAGOGASTRODUODENOSCOPY (EGD) WITH PROPOFOL  N/A 04/17/2021   Procedure: ESOPHAGOGASTRODUODENOSCOPY (EGD) WITH PROPOFOL ;  Surgeon: Therisa Bi, MD;  Location: [facility] ENDOSCOPY;  Service: Gastroenterology;  Laterality: N/A;   ESOPHAGOGASTRODUODENOSCOPY (EGD) WITH PROPOFOL  N/A 03/23/2023   Procedure: ESOPHAGOGASTRODUODENOSCOPY (EGD) WITH PROPOFOL ;  Surgeon: Therisa Bi, MD;  Location: [facility] ENDOSCOPY;  Service:  Gastroenterology;  Laterality: N/A;   RIGHT/LEFT HEART CATH AND CORONARY ANGIOGRAPHY Bilateral 04/14/2021   Procedure: RIGHT/LEFT HEART CATH AND CORONARY ANGIOGRAPHY;  Surgeon: Mady Bruckner, MD;  Location: [facility]  INVASIVE CV LAB;  Service: Cardiovascular;  Laterality: Bilateral;   TOE SURGERY Left 02/07/2016   Pinky Toe    Allergies: Allergies as of 12/20/2024   (No Known Allergies)    Medications: Outpatient Encounter Medications as of 12/20/2024  Medication Sig   amLODipine  (NORVASC ) 5 MG tablet Take 1 tablet (5 mg total) by mouth daily.   aspirin  EC 81 MG tablet Take by mouth.   atorvastatin  (LIPITOR) 80 MG tablet Take 1 tablet (80 mg total) by mouth daily.   Blood Glucose Monitoring Suppl (ACCU-CHEK GUIDE) w/Device KIT Use to check blood sugar 2 time daily as needed   carvedilol  (COREG ) 12.5 MG tablet TAKE 1 TABLET BY MOUTH 2 TIMES DAILY.   Continuous Glucose Sensor (DEXCOM G7 SENSOR) MISC 1 DEVICE BY DOES NOT APPLY ROUTE AS DIRECTED.   ENTRESTO  24-26 MG Take 1 tablet by mouth 2 (two) times daily.   ezetimibe  (ZETIA ) 10 MG tablet Take 1 tablet (10 mg total) by mouth daily.   gabapentin  (NEURONTIN ) 600 MG tablet Take 600 mg by mouth 3 (three) times daily.   gabapentin  (NEURONTIN ) 800 MG tablet Take 800 mg by mouth 3 (three) times daily.   Galcanezumab -gnlm (EMGALITY ) 120 MG/ML SOSY Inject 1 mL into the skin every 30 (thirty) days.   glucose blood (ACCU-CHEK GUIDE) test strip Check blood sugar 2 times daily as needed   insulin  aspart (NOVOLOG ) 100 UNIT/ML injection Inject 10 Units into the skin with breakfast, with lunch, and with evening meal.   Insulin  Glargine (BASAGLAR  KWIKPEN) 100 UNIT/ML Inject 70 Units into the skin daily.   Insulin  Pen Needle 32G X 4 MM MISC 1 Device by Does not apply route in the morning, at noon, in the evening, and at bedtime.   ipratropium (ATROVENT ) 0.03 % nasal spray PLACE 2 SPRAYS INTO BOTH NOSTRILS 2 (TWO) TIMES DAILY   JARDIANCE  25 MG TABS tablet Take 1 tablet (25 mg total) by mouth daily.   Lancets (ACCU-CHEK MULTICLIX) lancets Check sugar 2 times daily as needed.   levocetirizine (XYZAL ) 5 MG tablet Take 1 tablet (5 mg total) by mouth in the morning.    linaclotide  (LINZESS ) 290 MCG CAPS capsule Take 1 capsule (290 mcg total) by mouth daily before breakfast.   metFORMIN  (GLUCOPHAGE -XR) 500 MG 24 hr tablet Start with one tablet once daily. After one week, adjust to two tablets daily with a meal.   MOUNJARO  7.5 MG/0.5ML Pen Inject 7.5 mg into the skin once a week.   naproxen  (EC NAPROSYN ) 500 MG EC tablet Take 1 tablet (500 mg total) by mouth 2 (two) times daily with a meal.   nitroGLYCERIN  (NITROSTAT ) 0.4 MG SL tablet PLACE 1 TABLET UNDER THE TONGUE EVERY 5 MINUTES AS NEEDED FOR CHEST PAIN.   Rimegepant Sulfate  (NURTEC) 75 MG TBDP Take 1 tablet (75 mg total) by mouth daily as needed. Max of every other day prn   spironolactone  (ALDACTONE ) 25 MG tablet Take 1 tablet (25 mg total) by mouth daily.   VENTOLIN  HFA 108 (90 Base) MCG/ACT inhaler INHALE 2 PUFFS BY MOUTH EVERY 4 HOURS AS NEEDED FOR WHEEZE OR FOR SHORTNESS OF BREATH   No facility-administered encounter medications on file as of 12/20/2024.    Social History: Social History   Tobacco Use   Smoking status: Never  Smokeless tobacco: Never  Vaping Use   Vaping status: Never Used  Substance Use Topics   Alcohol use: Never   Drug use: Never    Family Medical History: Family History  Problem Relation Age of Onset   Diabetes Mother    Ulcers Mother    Heart disease Father    AAA (abdominal aortic aneurysm) Father    Diabetes Father    Hypertension Father    Stroke Father    Alzheimer's disease Father    Heart attack Sister    Seizures Brother    Diabetes Maternal Grandmother    Breast cancer Maternal Grandmother     Physical Examination:  NEUROLOGICAL:     Awake, alert, oriented to person, place, and time.  Speech is clear and fluent. Fund of knowledge is appropriate.   Cranial Nerves: Pupils equal round and reactive to light.    Patient is tender to palpation of her lumbar paraspinals.  Strength: Also has weakness in her deltoids approximately 4- out of 5 and  intrinsic hand weakness 4 out of 5.  Side Iliopsoas Quads Hamstring PF DF EHL  R 2 5 5 5 4 5   L 2 5 5 5 4 5     Has developed some hyperreflexia at the bilateral brachial radialis and has presence of a right sided Tromner reflex.  Clonus is not present.    Medical Decision Making  Imaging: MRI Lumbar Spine: IMPRESSION: 1. Multilevel degenerative changes of the lumbar spine as described above. 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 2. Moderate to severe spinal canal and bilateral lateral recess stenosis at L3-L4 and L5-S1.  I have personally reviewed the images and agree with the above interpretation.  Narrative & Impression  CLINICAL DATA:  Ascending weakness. Possible myelopathy. Weakness in both legs since 2024. Gait instability.   EXAM: MRI CERVICAL SPINE WITHOUT CONTRAST   TECHNIQUE: Multiplanar, multisequence MR imaging of the cervical spine was performed. No intravenous contrast was administered.   COMPARISON:  Thoracic and lumbar spine scoliosis series radiographs 11/22/2023   FINDINGS: Alignment: There is minimal, 1 mm grade 1 anterolisthesis of C4 on C5. 1-2 mm retrolisthesis of C5 on C6 and C6 on C7. Mild kyphotic angulation centered at C5.   Vertebrae: Vertebral body heights are maintained. Moderate to severe anterior and right C5-6 and diffuse C6-7 disc space narrowing. Mild anterior superior right C6-7 edematous marrow endplate degenerative changes. Large anterior C5-6 and C6-7 endplate osteophytes. The atlantodens interval is intact.   Cord: There is mild increased T2 signal within the right and left aspects of the cord at the C5-6 disc level (axial series 4, images 18 and 19) and within the left aspect of the cord just inferior to this (axial images 20 and 21). This is at and below region of C5-6 central canal stenosis described below. This increased T2 signal may represent chronic myelomalacia. Acute cord edema felt less  likely.   Posterior Fossa, vertebral arteries, paraspinal tissues: The visualized posterior fossa is unremarkable. The vertebral artery flow voids are maintained. The visualized paraspinal soft tissues are otherwise unremarkable.   Disc levels:   C2-3: Moderate left and moderate right facet joint hypertrophy. No posterior disc bulge. No central canal or neuroforaminal stenosis.   C3-4: Moderate bilateral facet joint hypertrophy. Note is made that the right vertebral artery extends deep into the right neural foramen (axial series 4, images 7 through 9). The left vertebral artery extends more moderately into the left neural foramen (  axial series 4 images 5 through 7). Care should be taken during any neuroforaminal intervention at this level. Minimal midline posterior disc protrusion. No central canal or neuroforaminal stenosis.   C4-5: Moderate to severe right and moderate left facet joint hypertrophy. Moderate right intraforaminal disc and endplate spurring with moderate to severe right neuroforaminal stenosis. Mild right lateral recess narrowing. The left neural foramina is patent. No central canal stenosis.   C5-6: Mild kyphotic angulation centered at C5-6. Moderate broad-based posterior disc osteophyte complex with mild midline posterior disc protrusion mildly to moderately impresses on the midline ventral cord. CSF is focally effaced posterior the cord. Moderate to high-grade bilateral intraforaminal disc and endplate spurring. Moderate to severe right greater than left neuroforaminal stenosis. Moderate central canal stenosis. The AP dimension of the central canal is narrowed down to 6 mm.   C6-7: Mild-to-moderate bilateral facet joint hypertrophy. Moderate broad-based posterior disc osteophyte complex with moderate to high-grade left and moderate right intraforaminal extension. Severe left and moderate to severe right neuroforaminal stenosis. Moderate left and mild right  lateral recess narrowing. Mild to moderate central canal stenosis.   C7-T1: Mild to moderate bilateral facet joint hypertrophy. No posterior disc bulge. No central canal or neuroforaminal stenosis.   Please see report from contemporaneous MRI of the thoracic spine for evaluation of the upper thoracic spine levels.   IMPRESSION: 1. Moderate C5-6 central canal stenosis with posterior disc osteophyte complex mildly to mildly impressing on the midline ventral cord. Mild increased T2 signal within the right and left aspects of the cord at the C5-6 disc level and within the left aspect of the cord just inferior to this. This is favored to represent chronic myelomalacia. Acute cord edema is felt less likely. 2. Additional multilevel degenerative disc and joint changes as above. 3. C5-6 moderate to severe right greater than left neuroforaminal stenosis. 4. C6-7 mild to moderate central canal stenosis. Severe left and moderate to severe right neuroforaminal stenosis. Moderate left and mild right lateral recess narrowing. 5. C4-5 moderate to severe right neuroforaminal stenosis. 6. Note is made that the right vertebral artery extends deep into the right C3-4 neural foramen. The left vertebral artery extends more moderately into the left C3-4 neural foramen. Although these are normal variants, care should be taken during any neuroforaminal intervention at this level.     Electronically Signed   By: Tanda Lyons M.D.   On: 04/13/2024 13:01      Assessment and Plan Assessment & Plan Cervical myelopathy Progressive cervical myelopathy with worsening gait, weakness in hips, ankles, and hands, and declining reflexes. Imaging reveals significant spinal cord compression at C5-6 and C6-7 with signal changes. Glycemic control has improved (A1c 8), but remains suboptimal. Myelopathy is worsening despite medical management, and surgical intervention is indicated to prevent further neurological  decline. Risks discussed include increased risk of infection, poor wound healing, and C5/C6 palsy (10-15% risk, higher in diabetics), as well as the possibility of persistent or worsening neurological deficits despite surgery. Anticipated outcomes include halting progression of neurological decline, with some patients improving, some stabilizing, and a minority worsening despite decompression. She expressed understanding of risks and benefits and elected to proceed with surgery. - Scheduled anterior cervical discectomy and fusion at C5-6 and C6-7 for decompression and stabilization.  Risk and benefits discussed.  Patient would like to go forward with the procedure. - Arranged preoperative clearance, including cardiology evaluation. - Arranged postoperative rehabilitation to maximize functional recovery.  Chronic low back pain Chronic low back  pain previously managed with steroid injections. She seeks further pain management and inquires about the safety of lumbar injections prior to cervical surgery. Lumbar injections are acceptable prior to surgery. - Referred to pain management for consideration of lumbar facet injections versus medial branch block prior to cervical surgery. - Provided names of local pain management providers.   Sarah Duffy Sharps Dept. of Neurosurgery "

## 2024-12-21 ENCOUNTER — Telehealth: Payer: Self-pay

## 2024-12-21 ENCOUNTER — Ambulatory Visit: Admitting: Cardiology

## 2024-12-21 NOTE — Telephone Encounter (Signed)
 Cardiology appointment with [facility] for today was canceled with a note that states [facility] patient?. It appears Ms Savard saw Davene Nicolas 04/13/24 and [facility] Cardiology 04/25/24. She also has an upcoming appointment with [facility] Cardiology on 01/25/25. I sent a new clearance request to [facility]iology. I left a message for the patient about this.

## 2024-12-26 ENCOUNTER — Ambulatory Visit: Admitting: Family Medicine

## 2024-12-26 ENCOUNTER — Ambulatory Visit: Admitting: Cardiology

## 2024-12-26 NOTE — Progress Notes (Unsigned)
" °  Cardiology Office Note   Date:  12/26/2024  ID:  Sarah Duffy, DOB 01-16-1971, MRN [id number] PCP: Glenard Mire, MD  [facility] Providers Cardiologist:  Redell Cave, MD { Click to update primary MD,subspecialty MD or APP then REFRESH:1}    History of Present Illness Sarah Duffy is a 54 y.o. female ***  ROS: ***  Studies Reviewed      *** Risk Assessment/Calculations {Does this patient have ATRIAL FIBRILLATION?:[phone]} No BP recorded.  {Refresh Note OR Click here to enter BP  :1}***       Physical Exam VS:  LMP  (LMP Unknown)        Wt Readings from Last 3 Encounters:  12/20/24 181 lb (82.1 kg)  11/15/24 190 lb (86.2 kg)  10/03/24 190 lb (86.2 kg)    GEN: Well nourished, well developed in no acute distress NECK: No JVD; No carotid bruits CARDIAC: ***RRR, no murmurs, rubs, gallops RESPIRATORY:  Clear to auscultation without rales, wheezing or rhonchi  ABDOMEN: Soft, non-tender, non-distended EXTREMITIES:  No edema; No deformity   ASSESSMENT AND PLAN ***    {Are you ordering a CV Procedure (e.g. stress test, cath, DCCV, TEE, etc)?   Press F2        :[id number]}  Dispo: ***  Signed, Siniya Lichty, NP   "

## 2024-12-26 NOTE — Progress Notes (Signed)
 Patient calls in today to discuss her upcoming surgery with Dr. Penne Sharps and would like to meet again via video visit after her surgery as well.  CASEY A ORVAL, PA

## 2024-12-28 ENCOUNTER — Other Ambulatory Visit: Payer: Self-pay

## 2024-12-28 ENCOUNTER — Telehealth: Payer: Self-pay

## 2024-12-28 NOTE — Patient Outreach (Signed)
 Complex Care Management   Visit Note  12/28/2024  Name:  Sarah Duffy MRN: [id number] DOB: Oct 28, 1971  Situation: Referral received for Complex Care Management related to Heart Failure, Diabetes with Complications, and HTN I obtained verbal consent from Patient.  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 02/2021 Echo: EF 35-40%, glob HK, GrI DD, nl RV fxn, mildly dil LA, mod MR; b. 06/2022 Echo: EF 50-55%, no rwma, GrII DD, nl RV fxn, RVSP , mildly dil LA, mild MR, AoV sclerosis.   CKD (chronic kidney disease), stage III (HCC)    Complication of anesthesia 03/23/2023   Possible gastroparesis based off food in stomach during EGD   Coronary artery disease    a. 04/2021 Cath: LM nl, LAD 70p/m, 23m, 80d, LCX 50m, OM1 40, OM2 99 (fills via collats from OM1), RCA 60d, RPDA 75.   COVID-19 2021   Hyperlipidemia LDL goal <70    Hypertension    Ischemic cardiomyopathy    a. 02/2021 Echo: EF 35-40%, glob HK, GrI DD; b. 06/2022 Echo: EF 50-55%.   Moderate mitral regurgitation    a. 02/2021 Echo: Mod MR.   MRSA infection within last 3 months 02/25/2016   Osteomyelitis of foot (HCC) 08/26/2016   Type II diabetes mellitus (HCC)     Assessment: Patient Reported Symptoms:  Cognitive Cognitive Status: No symptoms reported      Neurological Neurological Review of Symptoms: Weakness, Headaches Neurological Management Strategies: Routine screening, Medication therapy Neurological Comment: weakness in arms and legs due to neck issues surgery 01/04/25  HEENT        Cardiovascular Cardiovascular Symptoms Reported: Swelling in legs or feet Does patient have uncontrolled Hypertension?: No Cardiovascular Management Strategies: Medication therapy, Routine screening Cardiovascular Comment: last couple of days feet swelling - has not been elevating regularly - states  calling cardiology for surgical clearance, sched 01/04/25, denies CP or HF sx  Respiratory Respiratory Symptoms Reported: Shortness of breath Other Respiratory Symptoms: reports occasional SOB in morning and when gets stressed/upset, will discuss with cardiology and red flags for ED provided Respiratory Management Strategies: Routine screening, Medication therapy  Endocrine Endocrine Symptoms Reported: No symptoms reported Is patient diabetic?: Yes Is patient checking blood sugars at home?: Yes List most recent blood sugar readings, include date and time of day: Dexcom,  FBG today 100 no lows/highs Endocrine Comment: noted AIC down >2 points, discouraged by additional carb counting class requirement by Endo before eligible for Omnipod  Gastrointestinal Gastrointestinal Symptoms Reported: No symptoms reported Gastrointestinal Management Strategies: Medication therapy    Genitourinary Genitourinary Symptoms Reported: No symptoms reported    Integumentary      Musculoskeletal Additional Musculoskeletal Details: back and neck pain/weakness in arms, legs rates pain typically neck 8/10 back is 9-10 (low back to right hip) nothing helping, states surgeon stated procedure could reduce pain and improve weakness, usually throbbing pain, walker at all time, transport wheelchair Musculoskeletal Management Strategies: Medication therapy, Medical device, Routine screening Falls in the past year?: Yes Number of falls in past year: 2 or more Was there an injury with Fall?: Yes Fall Risk Category Calculator: 3 Patient Fall Risk Level: High Fall Risk Patient at Risk for Falls Due to: Impaired balance/gait, History of fall(s), Impaired mobility, Orthopedic patient  Psychosocial Psychosocial Symptoms Reported: Irritability, Depression - if selected complete PHQ 2-9 Additional Psychological Details: stress with  things to do for upcoming surgery,  reports friends state she gets annoyed/irritable at times, advied  to check BG reading at these times, has not connected for couseling, Behavioral Management Strategies: Support system        12/28/2024    PHQ2-9 Depression Screening   Little interest or pleasure in doing things Nearly every day  Feeling down, depressed, or hopeless More than half the days (down and depressed)  PHQ-2 - Total Score 5  Trouble falling or staying asleep, or sleeping too much Nearly every day  Feeling tired or having little energy Nearly every day  Poor appetite or overeating  Several days  Feeling bad about yourself - or that you are a failure or have let yourself or your family down Several days  Trouble concentrating on things, such as reading the newspaper or watching television More than half the days  Moving or speaking so slowly that other people could have noticed.  Or the opposite - being so fidgety or restless that you have been moving around a lot more than usual More than half the days  Thoughts that you would be better off dead, or hurting yourself in some way Not at all  PHQ2-9 Total Score 17  If you checked off any problems, how difficult have these problems made it for you to do your work, take care of things at home, or get along with other people    Depression Interventions/Treatment      There were no vitals filed for this visit.    Medications Reviewed Today     Reviewed by Devra Lands, RN (Registered Nurse) on 12/28/24 at 1357  Med List Status: <None>   Medication Order Taking? Sig Documenting Provider Last Dose Status Informant  amLODipine  (NORVASC ) 5 MG tablet [id number] Yes Take 1 tablet (5 mg total) by mouth daily. [name], Krichna, MD  Active Self, Pharmacy Records  aspirin  EC 81 MG tablet [id number] Yes Take 81 mg by mouth daily. [provider]  Active Self, Pharmacy Records  atorvastatin  (LIPITOR) 80 MG tablet [id number] Yes Take 1 tablet (80 mg total) by mouth daily. [name], Krichna, MD  Active Self, Pharmacy Records  Blood  Glucose Monitoring Suppl (ACCU-CHEK GUIDE) w/Device PRESSLEY [id number] Yes Use to check blood sugar 2 time daily as needed [name], Ibtehal Jaralla, MD  Active Self, Pharmacy Records  carvedilol  (COREG ) 12.5 MG tablet [id number] Yes TAKE 1 TABLET BY MOUTH 2 TIMES DAILY. Gerard Frederick, NP  Active Self, Pharmacy Records  Continuous Glucose Sensor (DEXCOM G7 SENSOR) [place] [id number] Yes 1 DEVICE BY DOES NOT APPLY ROUTE AS DIRECTED. [name], Donell Cardinal, MD  Active Self, Pharmacy Records  ENTRESTO  24-26 MG [id number] Yes Take 1 tablet by mouth 2 (two) times daily. Gerard Frederick, NP  Active Self, Pharmacy Records  ezetimibe  (ZETIA ) 10 MG tablet [id number] Yes Take 1 tablet (10 mg total) by mouth daily. [name], Krichna, MD  Active Self, Pharmacy Records  gabapentin  (NEURONTIN ) 600 MG tablet [id number] Yes Take 600 mg by mouth 3 (three) times daily. [provider]  Active Self, Pharmacy Records  Galcanezumab -gnlm (EMGALITY ) 120 MG/ML SOSY [id number] Yes Inject 1 mL into the skin every 30 (thirty) days. [name], Krichna, MD  Active Self, Pharmacy Records  glucose blood (ACCU-CHEK GUIDE) test strip [id number] Yes Check blood sugar 2 times daily as needed [name], Ibtehal Jaralla, MD  Active Self, Pharmacy Records  insulin  aspart (NOVOLOG ) 100 UNIT/ML injection [id number] Yes Inject 10 Units into the skin with breakfast, with lunch, and  with evening meal.  Patient taking differently: Inject 15 Units into the skin with breakfast, with lunch, and with evening meal.   Wouk, Devaughn Sayres, MD  Active Self, Pharmacy Records  Insulin  Glargine (BASAGLAR  [facility]) 100 UNIT/ML [id number] Yes Inject 70 Units into the skin daily. [name], Donell Cardinal, MD  Active Self, Pharmacy Records  Insulin  Pen Needle 32G X 4 MM MISC [id number] Yes 1 Device by Does not apply route in the morning, at noon, in the evening, and at bedtime. [name], Donell Cardinal, MD  Active Self, Pharmacy Records  ipratropium (ATROVENT )  0.03 % nasal spray [id number] Yes PLACE 2 SPRAYS INTO BOTH NOSTRILS 2 (TWO) TIMES DAILY [name], Krichna, MD  Active Self, Pharmacy Records  JARDIANCE  25 MG TABS tablet [id number] Yes Take 1 tablet (25 mg total) by mouth daily. [name], Donell Cardinal, MD  Active Self, Pharmacy Records  Lancets [facility] MULTICLIX) lancets [id number] Yes Check sugar 2 times daily as needed. [name], Donell Cardinal, MD  Active Self, Pharmacy Records  levocetirizine (XYZAL ) 5 MG tablet [id number] Yes Take 1 tablet (5 mg total) by mouth in the morning. [name], Krichna, MD  Active Self, Pharmacy Records  linaclotide  (LINZESS ) 290 MCG CAPS capsule [id number] Yes Take 1 capsule (290 mcg total) by mouth daily before breakfast. Therisa Bi, MD  Active Self, Pharmacy Records  metFORMIN  (GLUCOPHAGE -XR) 500 MG 24 hr tablet [id number] Yes Take 1,000 mg by mouth 2 (two) times daily. [provider]  Active Self, Pharmacy Records  MOUNJARO  10 MG/0.5ML Pen [id number] Yes Inject 10 mg into the skin once a week. [provider]  Active Self, Pharmacy Records  naproxen  (EC NAPROSYN ) 500 MG EC tablet [id number]  Take 1 tablet (500 mg total) by mouth 2 (two) times daily with a meal.  Patient not taking: Reported on 12/26/2024   Janit Kast, PA-C  Active Self, Pharmacy Records  nitroGLYCERIN  (NITROSTAT ) 0.4 MG SL tablet [id number]  PLACE 1 TABLET UNDER THE TONGUE EVERY 5 MINUTES AS NEEDED FOR CHEST PAIN.  Patient not taking: Reported on 12/26/2024   Darliss Rogue, MD  Active Self, Pharmacy Records           Med Note [facility], [facility]   Tue Dec 26, 2024  4:05 PM) Has on hand if needed but has not had to take it  Rimegepant Sulfate  (NURTEC) 75 MG TBDP [id number] Yes Take 1 tablet (75 mg total) by mouth daily as needed. Max of every other day prn [name], Krichna, MD  Active Self, Pharmacy Records  spironolactone  (ALDACTONE ) 25 MG tablet [id number] Yes Take 1 tablet (25 mg total) by mouth daily. Darliss Rogue, MD   Active Self, Pharmacy Records  VENTOLIN  HFA 108 [phone]) MCG/ACT inhaler [id number] Yes INHALE 2 PUFFS BY MOUTH EVERY 4 HOURS AS NEEDED FOR WHEEZE OR FOR SHORTNESS OF BREATH [name], Krichna, MD  Active Self, Pharmacy Records            Recommendation:   PCP Follow-up Specialty provider follow-up Cardiology for clearance Continue Current Plan of Care LCSW Monday 01/01/2025  Follow Up Plan:   Telephone follow-up 2 Janda Cargo  [name], MSN, RN [facility]  [facility], [facility] RN Care Manager Direct Dial : [phone] Fax: [phone]

## 2024-12-28 NOTE — Patient Instructions (Signed)
 Visit Information  Ms. Umholtz was given information about Medicaid Managed Care team care coordination services as a part of their [employer] Medicaid benefit.   If you would like to schedule transportation through your [facility], please call the following number at least 2 days in advance of your appointment: [phone]   Rides for urgent appointments can also be made after hours by calling Member Services.  Call the Behavioral Health Crisis Line at [phone], at any time, 24 hours a day, 7 days a week. If you are in danger or need immediate medical attention call 911.  Please see education materials related to NONE provided by MyChart link.  Patient verbalizes understanding of instructions and care plan provided today and agrees to view in MyChart. Active MyChart status and patient understanding of how to access instructions and care plan via MyChart confirmed with patient.     Telephone follow up appointment with Managed Medicaid care management team member scheduled for: 01/12/2025 at 11:00 am  [name], MSN, RN [place]  [facility], [facility] Health RN Care Manager Direct Dial : [phone] Fax: [phone]   Following is a copy of your plan of care:   Goals Addressed             This Visit's Progress    VBCI RN Care Plan - CHF/HTN   On track    Problems:  Chronic Disease Management support and education needs related to CHF and HTN  Goal: Over the next 90 days the Patient will attend all scheduled medical appointments: Patient will attend all appointments as evidenced by chart review        continue to work with RN Care Manager and/or Social Worker to address care management and care coordination needs related to CHF as evidenced by adherence to care management team scheduled appointments     take all medications exactly as prescribed and will call provider for medication related questions as  evidenced by patient report    verbalize basic understanding of CHF and HTN disease process and self health management plan as evidenced by taking medications as prescribed, checking BP daily, purchasing scale and checking weight daily, until scale, assess for other sx of HF using Action plan and education provided, lifestyle modifications including low salt cardiac healthy diet, exercise  Interventions:   Heart Failure Interventions: Basic overview and discussion of pathophysiology of Heart Failure reviewed Provided education on low sodium diet Reviewed Heart Failure Action Plan in depth and provided written copy - no sx HF Assessed need for readable accurate scales in home - weighing a couple times a week Discussed importance of daily weight and advised patient to weigh and record daily - ptient will request scale from [facility] nurse Reviewed role of diuretics in prevention of fluid overload and management of heart failure; no sx HF Discussed the importance of keeping all appointments with provider Advised patient to discuss medications received from Rehab (not taking) with provider Screening for signs and symptoms of depression related to chronic disease state Referred to LCSW Assessed social determinant of health barriers  Patient focus on pain at this time, denies HF sx, needs neck surgery before she can do cardiac surgery. PCA services 2h week and reports needing additional assist, RNCM will request HHC eval and trnsport wheelchair. - Transport WC received. PCA 2.5 hours/day 7 days/week.  Patient will ask [facility] nurse about getting on waiting list (discussed at visit this week) patient will call [employer] to determine  if qualifies for Dual Complete  BP running low in am  - appointment with Dr Wiliam made during visit and fall recautions/ED precautions given Conference call with PCP office to schedule appointment for surgical clearance Scheduled with LCSW for follow up 01/01/2025. No HF sx and BP  controlled at this time.  Hypertension Interventions: Last practice recorded BP readings:  BP Readings from Last 3 Encounters:  12/20/24 100/60  11/15/24 91/61  10/03/24 120/72   Most recent eGFR/CrCl:  Lab Results  Component Value Date   EGFR 58 (L) 12/10/2023    No components found for: CRCL  Evaluation of current treatment plan related to hypertension self management and patient's adherence to plan as established by provider Provided education to patient re: stroke prevention, s/s of heart attack and stroke Reviewed medications with patient and discussed importance of compliance Discussed plans with patient for ongoing care management follow up and provided patient with direct contact information for care management team Advised patient, providing education and rationale, to monitor blood pressure daily and record, calling PCP for findings outside established parameters Provided education on prescribed diet low salt cardiac healthy Discussed complications of poorly controlled blood pressure such as heart disease, stroke, circulatory complications, vision complications, kidney impairment, sexual dysfunction Screening for signs and symptoms of depression related to chronic disease state  Assessed social determinant of health barriers  Patient Self-Care Activities:  Attend all scheduled provider appointments Call pharmacy for medication refills 3-7 days in advance of running out of medications Call provider office for new concerns or questions  Take medications as prescribed   Work with the social worker to address care coordination needs and will continue to work with the clinical team to address health care and disease management related needs call office if I gain more than 2 pounds in one day or 5 pounds in one week do ankle pumps when sitting keep legs up while sitting track weight in diary use salt in moderation watch for swelling in feet, ankles and legs every day weigh  myself daily eat more whole grains, fruits and vegetables, lean meats and healthy fats check blood pressure daily learn about high blood pressure keep a blood pressure log take blood pressure log to all doctor appointments call doctor for signs and symptoms of high blood pressure keep all doctor appointments take medications for blood pressure exactly as prescribed report new symptoms to your doctor eat more whole grains, fruits and vegetables, lean meats and healthy fats  Plan:  Telephone follow up appointment with care management team member scheduled for:  01/12/25 at 2:00 pm          VBCI RN Care Plan - DM   On track    Problems:  Chronic Disease Management support and education needs related to DMII  Goal: Over the next 90 days the Patient will attend all scheduled medical appointments: patient will attend all appointments as evidenced by chart review        continue to work with RN Care Manager and/or Social Worker to address care management and care coordination needs related to DMII as evidenced by adherence to care management team scheduled appointments     take all medications exactly as prescribed and will call provider for medication related questions as evidenced by reporting compliance with all medications    verbalize basic understanding of DMII disease process and self health management plan as evidenced by taking all medications as ordered, checking FBG daily, lifestyle modifications including diabetic diet, exercise as  tolerated, attending DM classes through endocrinology  Interventions:   Diabetes Interventions: Assessed patient's understanding of A1c goal: <7% Provided education to patient about basic DM disease process Reviewed medications with patient and discussed importance of medication adherence Counseled on importance of regular laboratory monitoring as prescribed Discussed plans with patient for ongoing care management follow up and provided patient with  direct contact information for care management team Provided patient with written educational materials related to hypo and hyperglycemia and importance of correct treatment Reviewed scheduled/upcoming provider appointments including: DM Education & PCP Advised patient, providing education and rationale, to check cbg using Dexcom 7 and record, calling Endocrinology  for findings outside established parameters Referral made to social work team for assistance with Financial strain - BSW appointment 08/31/24 at 11:00 am - patient following with BSW - applied for Food Stamps, interview with disability  Review of patient status, including review of consultants reports, relevant laboratory and other test results, and medications completed Screening for signs and symptoms of depression related to chronic disease state - Referral to LCSW - sadness, depression 09/13/2024 at 10:00 am - following with LCSW - completing online paperwork with community based BH to schedule appointment Assessed social determinant of health barriers - Multiple needs Rescheduled with BSW for 09/11/24 Instructed patient to [facility] if she does not get call/scheduled with PT by Tuesday. Sending request for elevated toilet seat order - received but not high enough - patient will contact company to switch out Discussed using manual BGM when Dexcom alarms due to lag time and patient overcorrecting low BG. Noted DM Nutrition appointment so can get omnipod 11/3 Patient pending cervical decompression surgery - labs including [facility] before scheduling Surgery 01/04/25 Patient reports BG improving, issues with pain since fall over weekend. A1C down > 2 points, positive feedback given.   Lab Results  Component Value Date   HGBA1C 8.0 (H) 12/04/2024    Patient Self-Care Activities:  Attend all scheduled provider appointments Call pharmacy for medication refills 3-7 days in advance of running out of medications Call provider office for new concerns or  questions  Take medications as prescribed   Work with the social worker to address care coordination needs and will continue to work with the clinical team to address health care and disease management related needs keep appointment with eye doctor check blood sugar at prescribed times: FBG and Per endocrinology - Dexcom 7 check feet daily for cuts, sores or redness drink 6 to 8 glasses of water each day fill half of plate with vegetables limit fast food meals to no more than 1 per week manage portion size prepare main meal at home 3 to 5 days each week switch to low-fat or skim milk switch to sugar-free drinks do heel pump exercise 2 to 3 times each day keep feet up while sitting wash and dry feet carefully every day wear comfortable, cotton socks wear comfortable, well-fitting shoes Take dexcom to all appointments for download  Plan:  Telephone follow up appointment with care management team member scheduled for:  01/12/2025 at 2:00 pm

## 2024-12-28 NOTE — Telephone Encounter (Signed)
-----   Message from Penne Sharps, MD sent at 12/28/2024  8:20 AM EST ----- Okay great thanks, continue as planned

## 2024-12-29 ENCOUNTER — Ambulatory Visit: Admitting: Medical

## 2024-12-29 ENCOUNTER — Encounter
Admission: RE | Admit: 2024-12-29 | Discharge: 2024-12-29 | Disposition: A | Discharge status: Home / Self Care | Source: Ambulatory Visit | Attending: Neurosurgery | Admitting: Neurosurgery

## 2024-12-29 ENCOUNTER — Other Ambulatory Visit: Payer: Self-pay

## 2024-12-29 ENCOUNTER — Telehealth: Payer: Self-pay

## 2024-12-29 VITALS — BP 105/76 | HR 95 | Resp 14 | Ht 69.0 in | Wt 190.0 lb

## 2024-12-29 DIAGNOSIS — Z01818 Encounter for other preprocedural examination: Secondary | ICD-10-CM

## 2024-12-29 DIAGNOSIS — E1142 Type 2 diabetes mellitus with diabetic polyneuropathy: Secondary | ICD-10-CM | POA: Insufficient documentation

## 2024-12-29 DIAGNOSIS — E1129 Type 2 diabetes mellitus with other diabetic kidney complication: Secondary | ICD-10-CM | POA: Insufficient documentation

## 2024-12-29 DIAGNOSIS — Z794 Long term (current) use of insulin: Secondary | ICD-10-CM | POA: Diagnosis not present

## 2024-12-29 DIAGNOSIS — R809 Proteinuria, unspecified: Secondary | ICD-10-CM | POA: Insufficient documentation

## 2024-12-29 DIAGNOSIS — M4712 Other spondylosis with myelopathy, cervical region: Secondary | ICD-10-CM | POA: Insufficient documentation

## 2024-12-29 DIAGNOSIS — R2689 Other abnormalities of gait and mobility: Secondary | ICD-10-CM | POA: Diagnosis not present

## 2024-12-29 DIAGNOSIS — G959 Disease of spinal cord, unspecified: Secondary | ICD-10-CM | POA: Insufficient documentation

## 2024-12-29 DIAGNOSIS — R269 Unspecified abnormalities of gait and mobility: Secondary | ICD-10-CM

## 2024-12-29 DIAGNOSIS — Z01812 Encounter for preprocedural laboratory examination: Secondary | ICD-10-CM | POA: Insufficient documentation

## 2024-12-29 DIAGNOSIS — R2681 Unsteadiness on feet: Secondary | ICD-10-CM | POA: Diagnosis not present

## 2024-12-29 HISTORY — DX: Sleep apnea, unspecified: G47.30

## 2024-12-29 HISTORY — DX: Angina pectoris, unspecified: I20.9

## 2024-12-29 LAB — URINALYSIS, COMPLETE (UACMP) WITH MICROSCOPIC
Bilirubin Urine: NEGATIVE
Glucose, UA: >=500 mg/dL — AB
Ketones, ur: NEGATIVE mg/dL
Leukocytes,Ua: NEGATIVE
Nitrite: NEGATIVE
Protein, ur: NEGATIVE mg/dL
Specific Gravity, Urine: 1.028 (ref 1.005–1.030)
WBC, UA: 0 WBC/hpf (ref 0–5)
pH: 5 (ref 5.0–8.0)

## 2024-12-29 LAB — SURGICAL PCR SCREEN
MRSA, PCR: NEGATIVE
Staphylococcus aureus: POSITIVE — AB

## 2024-12-29 LAB — TYPE AND SCREEN
ABO/RH(D): AB POS
Antibody Screen: NEGATIVE

## 2024-12-29 NOTE — Progress Notes (Unsigned)
" °  Cardiology Office Note   Date:  12/29/2024  ID:  Sarah Duffy, DOB 22-Nov-1971, MRN [id number] PCP: Sarah Mire, MD  [facility] Providers Cardiologist:  Redell Cave, MD { Click to update primary MD,subspecialty MD or APP then REFRESH:1}    History of Present Illness Sarah Duffy is a 54 y.o. female ***  ROS: ***  Studies Reviewed      *** Risk Assessment/Calculations {Does this patient have ATRIAL FIBRILLATION?:[phone]} No BP recorded.  {Refresh Note OR Click here to enter BP  :1}***       Physical Exam VS:  LMP  (LMP Unknown)        Wt Readings from Last 3 Encounters:  12/20/24 181 lb (82.1 kg)  11/15/24 190 lb (86.2 kg)  10/03/24 190 lb (86.2 kg)    GEN: Well nourished, well developed in no acute distress NECK: No JVD; No carotid bruits CARDIAC: ***RRR, no murmurs, rubs, gallops RESPIRATORY:  Clear to auscultation without rales, wheezing or rhonchi  ABDOMEN: Soft, non-tender, non-distended EXTREMITIES:  No edema; No deformity   ASSESSMENT AND PLAN ***    {Are you ordering a CV Procedure (e.g. stress test, cath, DCCV, TEE, etc)?   Press F2        :[id number]}  Dispo: ***  Signed, Munir Victorian VEAR Fishman, PA-C   "

## 2024-12-29 NOTE — Telephone Encounter (Signed)
 Patient reported issues with coverage of Basaglar  insulin  and Novolog  insulin . Per formulary, Lantus  is preferred basal insulin  and insulin  aspart (generic Novolog ) is preferred bolus insulin .  Confirmed both medications were covered on insurance with the pharmacy.  Chrishawn Kring E. Marsh, PharmD, BCACP, CPP Clinical Pharmacist [facility] [phone]

## 2024-12-29 NOTE — Patient Instructions (Addendum)
 Your procedure is scheduled on: Thursday 01/04/25 To find out your arrival time, please call [phone] between 1PM - 3PM on: Wednesday 01/03/25 Report to the Registration Desk on the 1st floor of the [facility]. If your arrival time is 6:00 am, do not arrive before that time as the [facility] entrance doors do not open until 6:00 am.  REMEMBER: Instructions that are not followed completely may result in serious medical risk, up to and including death; or upon the discretion of your surgeon and anesthesiologist your surgery may need to be rescheduled.  Do not eat food after midnight the night before surgery.  No gum chewing or hard candies.  You may however, drink CLEAR liquids up to 2 hours before you are scheduled to arrive for your surgery. Do not drink anything within 2 hours of your scheduled arrival time.  Clear liquids include: - water   **Type 1 and Type 2 diabetics should only drink water.**  One week prior to surgery: Stop Anti-inflammatories (NSAIDS) such as Advil, Aleve , Ibuprofen, Motrin, Naproxen , Naprosyn  and Aspirin  based products such as Excedrin, Goody's Powder, BC Powder.  You may however, continue to take Tylenol  if needed for pain up until the day of surgery.  Stop ANY OVER THE COUNTER supplements and vitamins for at least 7 days until after surgery.  **Follow guidelines for insulin  and diabetes medications.** No Mounjaro  for 7 days before your surgery (last dose per pt was 12/10/24) Jardiance  hold for 3 days, last dose Sunday 12/31/24. Metformin  hold for 2 days, last dose Monday 01/01/25. Your bedtime Insulin  decrease to half of normal dose (from 70 units down to 35 units)  **Follow recommendations regarding stopping blood thinners.** Last dose of Aspirin  per pt Wednesday 10/27/25  Continue taking all of your other prescription medications up until the day of surgery.  ON THE DAY OF SURGERY ONLY TAKE THESE MEDICATIONS WITH SIPS OF WATER:  atorvastatin   (LIPITOR) 80 MG tablet  carvedilol  (COREG ) 12.5 MG tablet  gabapentin  (NEURONTIN ) 600 MG tablet  You may use your nasal spray and take your allergy medication if needed  Use inhalers on the day of surgery and bring to the hospital.  No Alcohol for 24 hours before or after surgery.  No Smoking including e-cigarettes for 24 hours before surgery.  No chewable tobacco products for at least 6 hours before surgery.  No nicotine patches on the day of surgery.  Do not use any recreational drugs for at least a week (preferably 2 weeks) before your surgery.  Please be advised that the combination of cocaine and anesthesia may have negative outcomes, up to and including death. If you test positive for cocaine, your surgery will be cancelled.  On the morning of surgery brush your teeth with toothpaste and water, you may rinse your mouth with mouthwash if you wish. Do not swallow any toothpaste or mouthwash.  Use CHG Soap or wipes as directed on instruction sheet. (You can pick this up at our office in the [facility], the building to the left of the [employer], [address] at [address]  Do not shave body hair from the neck down 48 hours before surgery.  Do not wear lotions, powders, or perfumes.   Wear comfortable clothing (specific to your surgery type) to the hospital.  Do not wear jewelry, make-up, hairpins, clips or nail polish.  For welded (permanent) jewelry: bracelets, anklets, waist bands, etc.  Please have this removed prior to surgery.  If it is not removed, there is a chance that hospital personnel will need to cut it off on the day of surgery.  Contact lenses, hearing aids and dentures may not be worn into surgery.  Do not bring valuables to the hospital. [facility] is not responsible for any missing/lost belongings or valuables.   Bring your C-PAP to the hospital in case you may have to spend the night.   Notify your doctor if there is any  change in your medical condition (cold, fever, infection).  After surgery, you can help prevent lung complications by doing breathing exercises.  Take deep breaths and cough every 1-2 hours. Your doctor may order a device called an Incentive Spirometer to help you take deep breaths.  When coughing or sneezing, hold a pillow firmly against your incision with both hands. This is called splinting. Doing this helps protect your incision. It also decreases belly discomfort.  If you are being admitted to the hospital overnight, leave your suitcase in the car. After surgery it may be brought to your room.  In case of increased patient census, it may be necessary for you, the patient, to continue your postoperative care in the Same Day Surgery department.  Please call the Pre-admissions Testing Dept. at [phone] if you have any questions about these instructions.  Surgery Visitation Policy:  Patients having surgery or a procedure may have two visitors.  Children under the age of 55 must have an adult with them who is not the patient.  Inpatient Visitation:    Visiting hours are 7 a.m. to 8 p.m. Up to four visitors are allowed at one time in a patient room. The visitors may rotate out with other people during the day.  One visitor age 25 or older may stay with the patient overnight and must be in the room by 8 p.m.   Merchandiser, Retail to address health-related social needs:  [email or web address]    Pre-operative 4 CHG Bath Instructions   You can play a key role in reducing the risk of infection after surgery. Your skin needs to be as free of germs as possible. You can reduce the number of germs on your skin by washing with CHG (chlorhexidine  gluconate) soap before surgery. CHG is an antiseptic soap that kills germs and continues to kill germs even after washing.   DO NOT use if you have an allergy to chlorhexidine /CHG or antibacterial soaps. If your skin becomes  reddened or irritated, stop using the CHG and notify one of our RNs at [phone].   Please shower with the CHG soap starting 4 days before surgery using the following schedule:   Sunday 12/31/24 - Wednesday 01/03/25    Please keep in mind the following:  DO NOT shave, including legs and underarms, starting the day of your first shower.   You may shave your face at any point before/day of surgery.  Place clean sheets on your bed the day you start using CHG soap. Use a clean washcloth (not used since being washed) for each shower. DO NOT sleep with pets once you start using the CHG.   CHG Shower Instructions:  If you choose to wash your hair and private area, wash first with your normal shampoo/soap.  After you use shampoo/soap, rinse your hair and body thoroughly to remove shampoo/soap residue.  Turn the water OFF and apply about 3 tablespoons (45 ml) of CHG soap to a CLEAN washcloth.  Apply CHG soap ONLY FROM YOUR NECK DOWN  TO YOUR TOES (washing for 3-5 minutes)  DO NOT use CHG soap on face, private areas, open wounds, or sores.  Pay special attention to the area where your surgery is being performed.  If you are having back surgery, having someone wash your back for you may be helpful. Wait 2 minutes after CHG soap is applied, then you may rinse off the CHG soap.  Pat dry with a clean towel  Put on clean clothes/pajamas   If you choose to wear lotion, please use ONLY the CHG-compatible lotions on the back of this paper.     Additional instructions for the day of surgery: DO NOT APPLY any lotions, deodorants, cologne, or perfumes.   Put on clean/comfortable clothes.  Brush your teeth.  Ask your nurse before applying any prescription medications to the skin.      CHG Compatible Lotions   Aveeno Moisturizing lotion  Cetaphil Moisturizing Cream  Cetaphil Moisturizing Lotion  Clairol Herbal Essence Moisturizing Lotion, Dry Skin  Clairol Herbal Essence Moisturizing Lotion,  Extra Dry Skin  Clairol Herbal Essence Moisturizing Lotion, Normal Skin  Curel Age Defying Therapeutic Moisturizing Lotion with Alpha Hydroxy  Curel Extreme Care Body Lotion  Curel Soothing Hands Moisturizing Hand Lotion  Curel Therapeutic Moisturizing Cream, Fragrance-Free  Curel Therapeutic Moisturizing Lotion, Fragrance-Free  Curel Therapeutic Moisturizing Lotion, Original Formula  Eucerin Daily Replenishing Lotion  Eucerin Dry Skin Therapy Plus Alpha Hydroxy Crme  Eucerin Dry Skin Therapy Plus Alpha Hydroxy Lotion  Eucerin Original Crme  Eucerin Original Lotion  Eucerin Plus Crme Eucerin Plus Lotion  Eucerin TriLipid Replenishing Lotion  Keri Anti-Bacterial Hand Lotion  Keri Deep Conditioning Original Lotion Dry Skin Formula Softly Scented  Keri Deep Conditioning Original Lotion, Fragrance Free Sensitive Skin Formula  Keri Lotion Fast Absorbing Fragrance Free Sensitive Skin Formula  Keri Lotion Fast Absorbing Softly Scented Dry Skin Formula  Keri Original Lotion  Keri Skin Renewal Lotion Keri Silky Smooth Lotion  Keri Silky Smooth Sensitive Skin Lotion  Nivea Body Creamy Conditioning Oil  Nivea Body Extra Enriched Lotion  Nivea Body Original Lotion  Nivea Body Sheer Moisturizing Lotion Nivea Crme  Nivea Skin Firming Lotion  NutraDerm 30 Skin Lotion  NutraDerm Skin Lotion  NutraDerm Therapeutic Skin Cream  NutraDerm Therapeutic Skin Lotion  ProShield Protective Hand Cream  Provon moisturizing lotion  How to Use an Incentive Spirometer  An incentive spirometer is a tool that measures how well you are filling your lungs with each breath. Learning to take long, deep breaths using this tool can help you keep your lungs clear and active. This may help to reverse or lessen your chance of developing breathing (pulmonary) problems, especially infection. You may be asked to use a spirometer: After a surgery. If you have a lung problem or a history of smoking. After a long period  of time when you have been unable to move or be active. If the spirometer includes an indicator to show the highest number that you have reached, your health care provider or respiratory therapist will help you set a goal. Keep a log of your progress as told by your health care provider. What are the risks? Breathing too quickly may cause dizziness or cause you to pass out. Take your time so you do not get dizzy or light-headed. If you are in pain, you may need to take pain medicine before doing incentive spirometry. It is harder to take a deep breath if you are having pain. How to use your incentive spirometer  Sit up on the edge of your bed or on a chair. Hold the incentive spirometer so that it is in an upright position. Before you use the spirometer, breathe out normally. Place the mouthpiece in your mouth. Make sure your lips are closed tightly around it. Breathe in slowly and as deeply as you can through your mouth, causing the piston or the ball to rise toward the top of the chamber. Hold your breath for 3-5 seconds, or for as long as possible. If the spirometer includes a coach indicator, use this to guide you in breathing. Slow down your breathing if the indicator goes above the marked areas. Remove the mouthpiece from your mouth and breathe out normally. The piston or ball will return to the bottom of the chamber. Rest for a few seconds, then repeat the steps 10 or more times. Take your time and take a few normal breaths between deep breaths so that you do not get dizzy or light-headed. Do this every 1-2 hours when you are awake. If the spirometer includes a goal marker to show the highest number you have reached (best effort), use this as a goal to work toward during each repetition. After each set of 10 deep breaths, cough a few times. This will help to make sure that your lungs are clear. If you have an incision on your chest or abdomen from surgery, place a pillow or a rolled-up  towel firmly against the incision when you cough. This can help to reduce pain while taking deep breaths and coughing. General tips When you are able to get out of bed: Walk around often. Continue to take deep breaths and cough in order to clear your lungs. Keep using the incentive spirometer until your health care provider says it is okay to stop using it. If you have been in the hospital, you may be told to keep using the spirometer at home. Contact a health care provider if: You are having difficulty using the spirometer. You have trouble using the spirometer as often as instructed. Your pain medicine is not giving enough relief for you to use the spirometer as told. You have a fever. Get help right away if: You develop shortness of breath. You develop a cough with bloody mucus from the lungs. You have fluid or blood coming from an incision site after you cough. Summary An incentive spirometer is a tool that can help you learn to take long, deep breaths to keep your lungs clear and active. You may be asked to use a spirometer after a surgery, if you have a lung problem or a history of smoking, or if you have been inactive for a long period of time. Use your incentive spirometer as instructed every 1-2 hours while you are awake. If you have an incision on your chest or abdomen, place a pillow or a rolled-up towel firmly against your incision when you cough. This will help to reduce pain. Get help right away if you have shortness of breath, you cough up bloody mucus, or blood comes from your incision when you cough. This information is not intended to replace advice given to you by your health care provider. Make sure you discuss any questions you have with your health care provider. Document Revised: 02/19/2020 Document Reviewed: 02/19/2020 Elsevier Patient Education  2023 [employer].

## 2024-12-30 LAB — FRUCTOSAMINE: Fructosamine: 483 umol/L — ABNORMAL HIGH (ref 0–285)

## 2025-01-01 ENCOUNTER — Encounter: Payer: Self-pay | Admitting: Urgent Care

## 2025-01-01 ENCOUNTER — Telehealth: Payer: Self-pay | Admitting: Neurosurgery

## 2025-01-01 ENCOUNTER — Ambulatory Visit: Payer: Self-pay | Admitting: Urgent Care

## 2025-01-01 ENCOUNTER — Telehealth: Payer: Self-pay | Admitting: Licensed Clinical Social Worker

## 2025-01-01 ENCOUNTER — Encounter: Payer: Self-pay | Admitting: Licensed Clinical Social Worker

## 2025-01-01 NOTE — Patient Instructions (Signed)
 Sarah Duffy - I am sorry I was unable to reach you today for our scheduled appointment. I work with [name], Krichna, MD and am calling to support your healthcare needs. Please contact me at [phone] at your earliest convenience. I look forward to speaking with you soon.   Thank you,  Cena Ligas, LCSW Clinical Social Worker [facility] Population Health

## 2025-01-01 NOTE — Telephone Encounter (Signed)
 From the last visit note on 05/2024, the eye injections were lidocaine , proparacaine, and Bevacizumab .

## 2025-01-01 NOTE — Progress Notes (Addendum)
" °  Perioperative Services Pre-Admission/Anesthesia Testing    Date: 01/01/25  Name: Sarah Duffy DOB: [birth year] MRN:   [id number]  Re: Plans for surgery; poorly managed diabetes  Planned Surgical Procedure(s):     Case: [id number] Date/Time: 01/04/25 0700   Procedure: Anterior cervical discectomy and fusion, C5-C7   Anesthesia type: General   Diagnosis:      Cervical myelopathy (HCC) [G95.9]     Spondylosis, cervical, with myelopathy [M47.12]     Impaired gait and mobility [R26.89]     Gait abnormality [R26.9]     Gait instability [R26.81]   Pre-op diagnosis: Cervical Spondylosis with Myelopathy, Gait abnormality, Extremity Weakness   Location: [facility] OR ROOM 03 / [facility]   Surgeons: Claudene Penne ORN, MD        Clinical Notes:  Patient is scheduled for the above procedure on 01/04/2025 with Dr. Penne ORN Claudene, MD.  In preparation for her procedure, patient presented to the PAT clinic on 12/29/2024 for preoperative interview and labs.  During that time, I noted that patient had both a hemoglobin A1c (8.0%) and fructosamine level (438 umol/L) that was performed on 12/04/2024 that were both elevated.  Patient advising that she had spoken to pharmacy and had started a new insulin  regimen.  In efforts to determine whether or not patient's blood glucose had improved since prior assessment, repeat fructosamine level was performed as a part of her preoperative workup.  Fructosamine level found to be elevated at 483 umol/L. When converting to A1c based on the standard formula, approximate A1c value at this point would be: (0.017 * 483) + 1.61 = 9.82%  Communicating results to both PCP and attending surgeon. Patient pending medical and cardiology clearance for surgery. She no showed for her appointment with cardiology on Friday (12/29/2024). She still needs to be see by medicine this week for clearance. Per office, patient is myelopathic, however it is unclear if she is  clinically to the point of been deemed urgent/emergent for case to proceed without the appropriate clearances. With that said, management of her diabetes if of clear importance as well, not only during her perioperative course, but in terms of her overall cardiovascular/renovascular/metabolic health.    Dorise Pereyra, MSN, APRN, FNP-C, CEN [facility]  Perioperative Services Nurse Practitioner Phone: [phone] Fax: [phone] 01/01/25 8:14 AM  NOTE: This note has been prepared using Dragon dictation software. Despite my best ability to proofread, there is always the potential that unintentional transcriptional errors may still occur from this process. "

## 2025-01-01 NOTE — Telephone Encounter (Signed)
 Patient is calling to see if she is able to have her eye injections this week with her surgery being  on 01/04/2025? Please advise.

## 2025-01-02 ENCOUNTER — Encounter: Payer: Self-pay | Admitting: Urgent Care

## 2025-01-02 ENCOUNTER — Ambulatory Visit (INDEPENDENT_AMBULATORY_CARE_PROVIDER_SITE_OTHER): Admitting: Ophthalmology

## 2025-01-02 DIAGNOSIS — Z794 Long term (current) use of insulin: Secondary | ICD-10-CM

## 2025-01-02 DIAGNOSIS — E113513 Type 2 diabetes mellitus with proliferative diabetic retinopathy with macular edema, bilateral: Secondary | ICD-10-CM | POA: Diagnosis not present

## 2025-01-02 DIAGNOSIS — H4311 Vitreous hemorrhage, right eye: Secondary | ICD-10-CM | POA: Diagnosis not present

## 2025-01-02 DIAGNOSIS — H35033 Hypertensive retinopathy, bilateral: Secondary | ICD-10-CM

## 2025-01-02 DIAGNOSIS — Z7984 Long term (current) use of oral hypoglycemic drugs: Secondary | ICD-10-CM | POA: Diagnosis not present

## 2025-01-02 DIAGNOSIS — I10 Essential (primary) hypertension: Secondary | ICD-10-CM | POA: Diagnosis not present

## 2025-01-02 DIAGNOSIS — H25813 Combined forms of age-related cataract, bilateral: Secondary | ICD-10-CM | POA: Diagnosis not present

## 2025-01-02 MED ORDER — BEVACIZUMAB CHEMO INJECTION 1.25MG/0.05ML SYRINGE FOR KALEIDOSCOPE
1.2500 mg | INTRAVITREAL | Status: AC | PRN
  Administered 2025-01-02: 1.25 mg via INTRAVITREAL

## 2025-01-02 NOTE — Telephone Encounter (Signed)
 She has an appointment with cardiology at 10:55 and PCP at 11:20 tomorrow... fingers crossed she can get to both.SABRASABRA

## 2025-01-02 NOTE — Telephone Encounter (Signed)
 I spoke with Sarah Duffy regarding Dr Theressa response. I also advised her that she may want to call and see if there was a cancellation to be able to move one of her appts to today because her appts are with different offices and only 25 minutes apart.

## 2025-01-03 ENCOUNTER — Ambulatory Visit: Attending: Cardiology | Admitting: Cardiology

## 2025-01-03 ENCOUNTER — Telehealth: Payer: Self-pay

## 2025-01-03 ENCOUNTER — Ambulatory Visit: Admitting: Family Medicine

## 2025-01-03 ENCOUNTER — Encounter: Payer: Self-pay | Admitting: Cardiology

## 2025-01-03 ENCOUNTER — Ambulatory Visit: Admitting: Cardiology

## 2025-01-03 ENCOUNTER — Encounter: Payer: Self-pay | Admitting: Family Medicine

## 2025-01-03 VITALS — BP 110/66 | HR 87 | Resp 16 | Ht 69.0 in | Wt 178.3 lb

## 2025-01-03 VITALS — BP 90/52 | HR 92 | Ht 69.0 in | Wt 190.0 lb

## 2025-01-03 DIAGNOSIS — G959 Disease of spinal cord, unspecified: Secondary | ICD-10-CM | POA: Diagnosis not present

## 2025-01-03 DIAGNOSIS — I152 Hypertension secondary to endocrine disorders: Secondary | ICD-10-CM | POA: Insufficient documentation

## 2025-01-03 DIAGNOSIS — I340 Nonrheumatic mitral (valve) insufficiency: Secondary | ICD-10-CM | POA: Insufficient documentation

## 2025-01-03 DIAGNOSIS — I2510 Atherosclerotic heart disease of native coronary artery without angina pectoris: Secondary | ICD-10-CM | POA: Insufficient documentation

## 2025-01-03 DIAGNOSIS — I10 Essential (primary) hypertension: Secondary | ICD-10-CM | POA: Diagnosis not present

## 2025-01-03 DIAGNOSIS — E785 Hyperlipidemia, unspecified: Secondary | ICD-10-CM | POA: Diagnosis not present

## 2025-01-03 DIAGNOSIS — Z01818 Encounter for other preprocedural examination: Secondary | ICD-10-CM

## 2025-01-03 DIAGNOSIS — E1165 Type 2 diabetes mellitus with hyperglycemia: Secondary | ICD-10-CM

## 2025-01-03 DIAGNOSIS — I255 Ischemic cardiomyopathy: Secondary | ICD-10-CM | POA: Insufficient documentation

## 2025-01-03 DIAGNOSIS — I5020 Unspecified systolic (congestive) heart failure: Secondary | ICD-10-CM | POA: Insufficient documentation

## 2025-01-03 DIAGNOSIS — Z01810 Encounter for preprocedural cardiovascular examination: Secondary | ICD-10-CM | POA: Insufficient documentation

## 2025-01-03 DIAGNOSIS — N183 Chronic kidney disease, stage 3 unspecified: Secondary | ICD-10-CM | POA: Insufficient documentation

## 2025-01-03 DIAGNOSIS — G8929 Other chronic pain: Secondary | ICD-10-CM | POA: Diagnosis present

## 2025-01-03 DIAGNOSIS — M545 Low back pain, unspecified: Secondary | ICD-10-CM | POA: Diagnosis not present

## 2025-01-03 DIAGNOSIS — Z794 Long term (current) use of insulin: Secondary | ICD-10-CM

## 2025-01-03 DIAGNOSIS — E1159 Type 2 diabetes mellitus with other circulatory complications: Secondary | ICD-10-CM | POA: Diagnosis not present

## 2025-01-03 MED ORDER — AMLODIPINE BESYLATE 2.5 MG PO TABS: 2.5000 mg | ORAL_TABLET | Freq: Every day | ORAL | 3 refills | Status: AC

## 2025-01-03 NOTE — Progress Notes (Signed)
 " Cardiology Office Note   Date:  01/03/2025  ID:  Sarah Duffy, DOB 28-Oct-1971, MRN [id number] PCP: Glenard Mire, MD  [facility] Providers Cardiologist:  Redell Cave, MD     History of Present Illness Sarah Duffy is a 54 y.o. female with a past medical history of coronary artery disease, chronic heart failure with improved EF, ischemic cardiomyopathy, hypertension, hyperlipidemia, type 2 diabetes, CKD stage III, MRSA/osteomyelitis of the foot (2017), who is here today for follow-up requiring preoperative cardiovascular examination prior to upcoming procedure.   She previously been evaluated in March with transient chest discomfort concerning the following death of her mother.  Echocardiogram at that time revealed an LVEF of 35-40% with global hypokinesis, G1 DD.  Diagnostic catheterization was performed and showed severe multivessel CAD.  She was referred to CT surgery in September 2022 with recommendation for CABG.  It was initially scheduled for October 2022 but was subsequently canceled secondary to the start of an illness.  Echocardiogram in July 2023 showed an EF of 50-55%, G2 DD, mild MR.  She was referred back to CT surgery in August 2023 and deferred proceeding with CABG due to change in her medical insurance.  She was seen in clinic 03/03/2023 at that time she states she was doing well.  Amlodipine  was discontinued with plan to titrate Entresto  in the future.  She had recently been diagnosed with flu and had recovered.  She had not made a follow-up appointment with CT surgery because of ongoing issues with insurance.  There were no medication changes that were made and she was scheduled for labs and encouraged to make her follow-up with [facility].  She did follow-up with Dr. Sherrine on 08/25/2023.  At that time she had overall continued to feel well.  She continued denying chest pain or pressure.  She recently followed up with her PCP and was noted to have an elevated glucose and  a hemoglobin A1c was elevated 8.4.  She was scheduled to see her endocrinologist in the near future and advised to have that taken care of.  She was to follow back up with [facility] for coronary artery bypass grafting after she had seen her endocrinologist and her dentist about a tooth.  She was last seen in clinic 11/01/2023 overall she was doing well from a cardiac perspective.  She had several CT scans and was going through physical therapy and was awaiting MRI of her back.  She had followed up with Dr. Lucas who advised her to have some issues taken care of prior to undergoing open heart surgery.  She was continued on her current medication regimen without changes and discussed repeating her echocardiogram on return.   She followed up with [facility] cardiology to establish care on 03/01/2024.  She had previously been diagnosed with HFrEF in 2022 and coronary artery disease.  She continued not to have any overt angina or heart failure symptoms.  She been started on goal-directed medical therapy send most recent LV function was 50-55%.  LDL was found to be 72 with goal of less than 70 and she was recommended to start on ezetimibe  10.  They discontinued her amlodipine  due to some lightheadedness and dizziness while at work.  She was recommended to check her blood pressure daily and she was scheduled for an echocardiogram.  She was then scheduled a 66-month return appointment.   She presented to [facility] cardiology with Dr. Lera on/9/25 as a new patient to establish care.  She was scheduled for  an echocardiogram for reassessment of her LVEF.  Continued on amlodipine  and Entresto  as well as Jardiance , carvedilol , furosemide .  Continued on her lipid management.  She was scheduled for an exercise Myoview  for further evaluation of ischemia.  She was referred to neurosurgery for potentially the need for an MRI for her lower back pain.  She was to return for follow-up in 1 month.  She was last seen in clinic 04/13/2024 stating  for the [facility] perspective she is doing well.  She continued to battle with back issues.  She states she continued to be evaluated by physiatry.  Recently had a new MRI completed and was waiting for the next Epson treatment.  CT of the head was negative.  There were no changes made to her medications or further testing that was ordered at that time.  She was then seen in clinic 04/25/2024 by Dr. Perla [facility] cardiology.  At that time she denied chest pain, shortness of breath.  She was continued on carvedilol , Entresto , Jardiance  and spironolactone .  There were no medication changes that were made or further testing that was ordered.   She returns to clinic today accompanied by her nephew. She has been for progress since our last visit.  She was hospitalized in August and September and she went to rehab for 35-month.  She fell in October and November and is having issues previously with her lower back and now with her neck that require surgery.  She states that she did a good rehab at [facility] comments.  Since the issues with her neck she has been having just some occasional shortness of breath primarily she has talking or moving which she feels is related to the issues with her neck she has issues with sleep and is unsure if it is her blood sugar or blood pressure or just the neck in general.  She has noticed that she has less active that her blood pressure is slightly lower and her heart rate is slightly more elevated.  She denies any active chest pain or shortness of breath.  She denies any anginal equivalents or symptoms of decompensation.  She states that she has been compliant with her current medication regimen without any undue side effects and is hoping for physical therapy at home.  Her only complaint today is some occasional peripheral edema she sits with her legs dependent.  ROS: 10 point review of system has been reviewed and considered negative the exception was been listed in the HPI  Studies  Reviewed EKG Interpretation Date/Time:  Wednesday January 03 2025 10:02:24 EST Ventricular Rate:  92 PR Interval:  182 QRS Duration:  84 QT Interval:  344 QTC Calculation: 425 R Axis:   -45  Text Interpretation: Normal sinus rhythm Possible Left atrial enlargement Left anterior fascicular block Possible Anterior infarct (cited on or before 20-Jul-2024) When compared with ECG of 20-Jul-2024 00:11, No significant change was found Confirmed by Gerard Frederick ([id number]) on 01/03/2025 10:05:10 AM    Lexiscan  MPI 03/30/2024   The study is normal. The study is low risk.   LV perfusion is normal. There is no evidence of ischemia. There is no evidence of infarction.   Left ventricular function is normal. End diastolic cavity size is normal. End systolic cavity size is normal.   2D echo ([facility]) 04/06/2024 CONCLUSION -------------------------------------------------------------------------------  MILD LEFT VENTRICULAR SYSTOLIC DYSFUNCTION WITH NO LVH  ESTIMATED EF: 50%, CALC EF(2D): 49%  NORMAL LA PRESSURES WITH DIASTOLIC DYSFUNCTION (GRADE 1)  NORMAL RIGHT VENTRICULAR SYSTOLIC  FUNCTION  VALVULAR REGURGITATION: No AR, MILD MR, MILD PR, MILD TR  NO VALVULAR STENOSIS    2D echo [facility]) 03/01/2024 Summary   1. The left ventricle is normal in size with normal wall thickness.    2. The left ventricular systolic function is overall normal, LVEF is  visually estimated at 55-60%.    3. There is decreased contractile function involving the mid inferolateral  segment(s).   4. The left atrium is mildly dilated in size.    5. The right ventricle is normal in size, with normal systolic function.    TTE 06/23/22 1. Left ventricular ejection fraction, by estimation, is 50 to 55%. Left  ventricular ejection fraction by 3D volume is 52 %. Left ventricular  ejection fraction by PLAX is 53 %. The left ventricle has low normal  function. The left ventricle has no  regional wall motion abnormalities. Left ventricular  diastolic parameters  are consistent with Grade II diastolic dysfunction (pseudonormalization).  The average left ventricular global longitudinal strain is -18.9 %.   2. Right ventricular systolic function is normal. The right ventricular  size is normal. There is normal pulmonary artery systolic pressure. The  estimated right ventricular systolic pressure is 21.0 mmHg.   3. Left atrial size was mildly dilated.   4. The mitral valve is normal in structure. Mild mitral valve  regurgitation. No evidence of mitral stenosis.   5. The aortic valve is normal in structure. Aortic valve regurgitation is  not visualized. Aortic valve sclerosis is present, with no evidence of  aortic valve stenosis.   6. The inferior vena cava is normal in size with greater than 50%  respiratory variability, suggesting right atrial pressure of 3 mmHg.    R/LHC 04/14/21 Conclusions: Multivessel coronary artery disease including mild to moderate diffuse plaquing of the LAD and RCA.  The mid/distal LAD demonstrates 3 focal lesions of up to 80% that are hemodynamically significant by iFR (iFR = 0.71).  There is also a 99% stenosis of OM2 with faint left to left collaterals supplying the distal branch as well as sequential 60% distal RCA and 70-80% proximal RPDA lesions. Upper normal to mildly elevated left heart filling pressure. Normal right heart and pulmonary artery pressures. Normal cardiac output/index.   Recommendations: In the setting of multivessel coronary artery disease, diabetes mellitus, and reduced LVEF that appears to be due to ischemic cardiomyopathy, consultation with cardiac surgery for consideration of CABG is recommended. Initiate aspirin  81 mg daily. Increase carvedilol  to 12.5 mg twice daily with further escalation of goal-directed medical therapy for chronic HFrEF as tolerated. Aggressive secondary prevention of coronary artery disease.   TTE 03/12/21 1. Left ventricular ejection fraction, by  estimation, is 35 to 40%. The  left ventricle has moderately decreased function. The left ventricle  demonstrates global hypokinesis. Left ventricular diastolic parameters are  consistent with Grade I diastolic  dysfunction (impaired relaxation). The average left ventricular global  longitudinal strain is -13.5 %. The global longitudinal strain is  abnormal.   2. Right ventricular systolic function is normal. The right ventricular  size is normal.   3. Left atrial size was mildly dilated.   4. The mitral valve is normal in structure. Moderate mitral valve  regurgitation.   Risk Assessment/Calculations           Physical Exam VS:  BP (!) 90/52 (BP Location: Left Arm, Patient Position: Sitting, Cuff Size: Normal)   Pulse 92   Ht 5' 9 (1.753 m)  Wt 190 lb (86.2 kg)   LMP  (LMP Unknown)   SpO2 100%   BMI 28.06 kg/m        Wt Readings from Last 3 Encounters:  01/03/25 190 lb (86.2 kg)  12/29/24 190 lb (86.2 kg)  12/20/24 181 lb (82.1 kg)    GEN: Well nourished, well developed in no acute distress NECK: No JVD; No carotid bruits CARDIAC: RRR, II/VI systolic murmur RUSB, without rubs or gallops RESPIRATORY:  Clear with slightly diminished bases to auscultation without rales, wheezing or rhonchi  ABDOMEN: Soft, non-tender, non-distended EXTREMITIES: Trace pretibial edema; No deformity   ASSESSMENT AND PLAN Coronary artery disease status post diagnostic catheterization in May 2022 severe diffuse multivessel coronary artery disease.  She was previously seen by CT surgery with recommendation for CABG.  She followed up with Dr. Rexford who recommended with her ongoing issues with her back that be evaluated prior to any type of surgery.  She has had 2 echocardiograms were completed at [facility] and wanted to get a Myoview  that was completed at [facility] ordered by [facility] cardiology in 2025 and all were low risk normal finding studies that were unrevealing.  She continues to deny angina or symptoms of  decompensation.  EKG today reveals sinus rhythm with rate 92 with possible left atrial enlargement, left anterior fascicular block, and old anterior infarct with no acute ischemic changes noted.  She is continued on aspirin  81 mg daily, atorvastatin  80 mg daily, ezetimibe  10 mg daily.  No further ischemic evaluation is needed at this time.  HFimpEF/ischemic cardiomyopathy with normalized LVEF of.  She continues to remain euvolemic on exam.  He is continue GDMT of spironolactone  25 mg daily, Entresto  24/26 mg twice daily, Jardiance , carvedilol .  She continues to suffer from NYHA class I-II symptoms.  She does not require standing diuretic therapy.  Primary hypertension with a blood pressure of 90/52 blood pressure slightly soft today.  Amlodipine  is decreased to 2.5 mg daily to help for a little bit more blood pressure and she is continued on the remainder of her medication regimen.  She has been encouraged to continue to monitor pressures 1 to 2 hours postmedication administration at home as well.  Mixed hyperlipidemia with last LDL of 83 which is still above goal of 55.  She is continued on atorvastatin  80 mg and ezetimibe  10 mg.  After surgery we will refer her to Pharm.D. for PCSK9 inhibitor therapy.  Mitral regurgitation with heart murmur on exam noted mild MR on most recent echocardiogram.  Will continue with surveillance studies every 2 to 3 years or sooner if needed.  CKD stage IIIa with last serum creatinine of 1.13.  She has been encouraged urged to avoid NSAIDs and nephrotoxic agents when able.  Previously she was taking naproxen  which she had been advised discontinuing will benefit doing kidneys.    Type 2 diabetes she states previously been poorly controlled but her last hemoglobin A1c she stated was 8.  She is continued on her current medication regimen the ongoing management of her diabetes per her endocrinologist.  Ongoing back issues where she is requiring surgery at this point.  She  continues to follow with neurosurgery and orthopedics.  Preoperative cardiovascular examination    Ms. Maturin's perioperative risk of a major cardiac event is 11% according to the Revised Cardiac Risk Index (RCRI).  Therefore, she is at high risk for perioperative complications.   Her functional capacity is fair at 4.64 METs according to the [facility] Activity  Status Index (DASI). Recommendations: According to ACC/AHA guidelines, no further cardiovascular testing needed.  The patient may proceed to surgery at acceptable risk.   Antiplatelet and/or Anticoagulation Recommendations: Aspirin  can be held for 5-7 days prior to her surgery.  Please resume Aspirin  post operatively when it is felt to be safe from a bleeding standpoint.           Dispo: Patient to return to clinic to see MD/APP in 8 to 10 weeks or sooner if needed for further evaluation.  Signed, Kalven Ganim, NP   "

## 2025-01-03 NOTE — Patient Instructions (Signed)
 Medication Instructions:   Your physician recommends the following medication changes.  DECREASE: Amlodipine  2/5 mg tablet once daily  *If you need a refill on your cardiac medications before your next appointment, please call your pharmacy*  Lab Work:  None ordered at this time   If you have labs (blood work) drawn today and your tests are completely normal, you will receive your results only by:  MyChart Message (if you have MyChart) OR  A paper copy in the mail If you have any lab test that is abnormal or we need to change your treatment, we will call you to review the results.  Testing/Procedures:  None ordered at this time   Referrals:  None ordered at this time   Follow-Up:  At [facility], you and your health needs are our priority.  As part of our continuing mission to provide you with exceptional heart care, our providers are all part of one team.  This team includes your primary Cardiologist (physician) and Advanced Practice Providers or APPs (Physician Assistants and Nurse Practitioners) who all work together to provide you with the care you need, when you need it.  Your next appointment:   8 - 10 week(s)  Provider:    Redell Cave, MD or [name], NP    We recommend signing up for the patient portal called MyChart.  Sign up information is provided on this After Visit Summary.  MyChart is used to connect with patients for Virtual Visits (Telemedicine).  Patients are able to view lab/test results, encounter notes, upcoming appointments, etc.  Non-urgent messages can be sent to your provider as well.   To learn more about what you can do with MyChart, go to [email or web address].

## 2025-01-03 NOTE — Progress Notes (Signed)
 Name: Sarah Duffy   MRN: [id number]    DOB: 10/12/1971   Date:01/03/2025       Progress Note  Subjective  Chief Complaint  Chief Complaint  Patient presents with   Pre-op Exam   Discussed the use of AI scribe software for clinical note transcription with the patient, who gave verbal consent to proceed.  History of Present Illness Sarah Duffy is a 54 year old female with advanced heart disease and diabetes who presents for preoperative clearance for C5-7 anterior cervical discectomy.  She is scheduled for a C5-7 anterior cervical discectomy and requires preoperative clearance. Her advanced heart disease necessitated bypass surgery, which has not yet been performed. She received cardiology clearance today, but her diabetes management remains a concern for the upcoming surgery.  Her diabetes is currently uncontrolled, with an A1c of 8.4% as of December and a fructosamine level indicating out of control with last level of 483 mg/dL last week , an increase from the previous month's 438 mg/dL. Blood glucose readings have been in the 200s over the past week. She is on insulin , having recently switched from Basaglar  to a generic version, and also takes metformin  1000 mg daily also takes SGL-2 agonist . She uses a Dexcom for glucose monitoring but has not applied a new sensor due to the planned surgery.   Her dietary habits include oatmeal and apple for breakfast, which she acknowledges may be too high in carbohydrates. She drinks Pepsi Zero and sometimes skips lunch. Dinner often includes pizza or chicken. She does not consume sweet beverages or snacks like chips or cookies but does eat crackers and cheese.  She experiences problems walking and weakness in her legs, which have been ongoing for months. No chest pain or palpitations, but she acknowledges that her high blood sugar levels are her 'normal'.  She has a history of foot infections requiring debridement but no amputations. She is not  currently on the Omnipod insulin  pump but is considering it after receiving carbohydrate education. She sometimes skips insulin  doses when away from home without her medication bag but was not aware to not eat carbohydrates when does not have insulin  with her    Patient Active Problem List   Diagnosis Date Noted   Chronic combined systolic and diastolic CHF (congestive heart failure) (HCC) 07/20/2024   Fall at home, initial encounter 07/20/2024   Type II diabetes mellitus with renal manifestations (HCC) 07/20/2024   Overweight (BMI 25.0-29.9) 07/20/2024   Cervical myelopathy (HCC) 07/20/2024   Impaired gait and mobility 07/20/2024   Lumbar spinal stenosis 07/19/2024   Chronic pain syndrome 07/05/2024   Dyslipidemia due to type 2 diabetes mellitus (HCC) 07/05/2024   Spondylosis, cervical, with myelopathy 04/18/2024   Hip weakness 04/03/2024   Gait instability 04/03/2024   Chronic anemia 03/14/2024   Gait abnormality 11/30/2023   Spinal stenosis of lumbar region 11/30/2023   Chronic kidney disease, stage 3a (HCC) 11/09/2023   Weakness of extremity 09/20/2023   Arthropathy of lumbar facet joint 07/29/2023   Low back pain 07/29/2023   Pain in finger of left hand 07/29/2023   Sacroiliac joint pain 07/29/2023   Contact dermatitis and eczema 06/08/2023   Dysphagia 03/23/2023   Asthma, well controlled 03/09/2023   Depression, major, in remission 03/09/2023   Perennial allergic rhinitis with seasonal variation 03/09/2023   Type 2 diabetes mellitus with diabetic polyneuropathy, with long-term current use of insulin  (HCC) 08/31/2022   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(HCC) 08/31/2022   Hypertension 06/11/2022  Ischemic cardiomyopathy 06/11/2022   Coronary artery disease 06/11/2022   Chronic systolic heart failure (HCC) 06/11/2022   Angina pectoris associated with type 2 diabetes mellitus (HCC) 01/05/2022   Cardiomyopathy (HCC) 04/14/2021   History of 2019 novel coronavirus disease  (COVID-19) 06/23/2019   Chronic migraine w/o Sarah, not intractable, w/o stat migr 01/14/2018   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 associated with type 2 diabetes mellitus (HCC) 05/21/2016   Hyperlipidemia LDL goal <70 03/10/2016   MI (mitral incompetence) 02/25/2016   Insomnia 02/25/2016   Anxiety 09/04/2014    Past Surgical History:  Procedure Laterality Date   CHOLECYSTECTOMY  1999   COLONOSCOPY WITH PROPOFOL  N/A 03/25/2021   Procedure: COLONOSCOPY WITH PROPOFOL ;  Surgeon: Therisa Bi, MD;  Location: [facility] ENDOSCOPY;  Service: Gastroenterology;  Laterality: N/A;   COLONOSCOPY WITH PROPOFOL  N/A 04/17/2021   Procedure: COLONOSCOPY WITH PROPOFOL ;  Surgeon: Therisa Bi, MD;  Location: [facility] ENDOSCOPY;  Service: Gastroenterology;  Laterality: N/A;   ESOPHAGOGASTRODUODENOSCOPY (EGD) WITH PROPOFOL  N/A 03/25/2021   Procedure: ESOPHAGOGASTRODUODENOSCOPY (EGD) WITH PROPOFOL ;  Surgeon: Therisa Bi, MD;  Location: [facility] ENDOSCOPY;  Service: Gastroenterology;  Laterality: N/A;   ESOPHAGOGASTRODUODENOSCOPY (EGD) WITH PROPOFOL  N/A 04/17/2021   Procedure: ESOPHAGOGASTRODUODENOSCOPY (EGD) WITH PROPOFOL ;  Surgeon: Therisa Bi, MD;  Location: [facility] ENDOSCOPY;  Service: Gastroenterology;  Laterality: N/A;   ESOPHAGOGASTRODUODENOSCOPY (EGD) WITH PROPOFOL  N/A 03/23/2023   Procedure: ESOPHAGOGASTRODUODENOSCOPY (EGD) WITH PROPOFOL ;  Surgeon: Therisa Bi, MD;  Location: [facility] ENDOSCOPY;  Service: Gastroenterology;  Laterality: N/A;   RIGHT/LEFT HEART CATH AND CORONARY ANGIOGRAPHY Bilateral 04/14/2021   Procedure: RIGHT/LEFT HEART CATH AND CORONARY ANGIOGRAPHY;  Surgeon: Mady Bruckner, MD;  Location: [facility] INVASIVE CV LAB;  Service: Cardiovascular;  Laterality: Bilateral;   TOE SURGERY Left 02/07/2016   Pinky Toe    Family History  Problem Relation Age of Onset   Diabetes Mother    Ulcers Mother    Heart disease Father    AAA (abdominal aortic aneurysm) Father    Diabetes Father     Hypertension Father    Stroke Father    Alzheimer's disease Father    Heart attack Sister    Seizures Brother    Diabetes Maternal Grandmother    Breast cancer Maternal Grandmother     Social History   Tobacco Use   Smoking status: Never   Smokeless tobacco: Never  Substance Use Topics   Alcohol use: Never    Current Medications[1]  Allergies[2]  I personally reviewed active problem list, medication list, allergies, family history with the patient/caregiver today.   ROS  Ten systems reviewed and is negative except as mentioned in HPI    Objective Physical Exam CONSTITUTIONAL: Patient appears well-developed and well-nourished.  No distress. HEENT: Head atraumatic, normocephalic, neck supple. CARDIOVASCULAR: Normal rate, regular rhythm and normal heart sounds.  No murmur heard. No BLE edema. PULMONARY: Effort normal and breath sounds normal. No respiratory distress. ABDOMINAL: There is no tenderness or distention. MUSCULOSKELETAL: Normal gait. Without gross motor or sensory deficit. PSYCHIATRIC: Patient has a normal mood and affect. behavior is normal. Judgment and thought content normal.  Vitals:   01/03/25 1121  BP: 110/66  Pulse: 87  Resp: 16  SpO2: 98%  Weight: 178 lb 4.8 oz (80.9 kg)  Height: 5' 9 (1.753 m)    Body mass index is 26.33 kg/m.  Recent Results (from the past 2160 hours)  Microalbumin / creatinine urine ratio     Status: Abnormal   Collection Time: 12/04/24  2:55 PM  Result Value Ref Range   Microalb, Ur 8.1 (H) Not Estab. ug/mL   Microalb Creat Ratio 13 0 - 29 mg/g creat    Comment: (NOTE)                       Normal:                0 -  29                       Moderately increased: 30 - 300                       Severely increased:       >300 Performed At: [facility] [address] [place], [place] [id number] Jennette Shorter MD Ey:[id number]    Creatinine, Urine 64.2 Not Estab. mg/dL  Fructosamine     Status: Abnormal    Collection Time: 12/04/24  3:05 PM  Result Value Ref Range   Fructosamine 438 (H) 0 - 285 umol/L    Comment: (NOTE) Published reference interval for apparently healthy subjects between age 47 and 24 is 35 - 285 umol/L and in a poorly controlled diabetic population is 228 - 563 umol/L with a mean of 396 umol/L. Performed At: [facility] [address] [place], [place] [id number] Jennette Shorter MD Ey:[id number]   Hemoglobin A1c     Status: Abnormal   Collection Time: 12/04/24  3:05 PM  Result Value Ref Range   Hgb A1c MFr Bld 8.0 (H) 4.8 - 5.6 %    Comment: (NOTE) Diagnosis of Diabetes The following HbA1c ranges recommended by the American Diabetes Association (ADA) may be used as an aid in the diagnosis of diabetes mellitus.  Hemoglobin             Suggested A1C NGSP%              Diagnosis  <5.7                   Non Diabetic  5.7-6.4                Pre-Diabetic  >6.4                   Diabetic  <7.0                   Glycemic control for                       adults with diabetes.     Mean Plasma Glucose 182.9 mg/dL    Comment: Performed at [facility], [address] [place], [place] [address]  Comprehensive metabolic panel with GFR     Status: Abnormal   Collection Time: 12/04/24  3:05 PM  Result Value Ref Range   Sodium 139 135 - 145 mmol/L   Potassium 4.6 3.5 - 5.1 mmol/L   Chloride 101 98 - 111 mmol/L   CO2 23 22 - 32 mmol/L   Glucose, Bld 228 (H) 70 - 99 mg/dL    Comment: Glucose reference range applies only to samples taken after fasting for at least 8 hours.   BUN 26 (H) 6 - 20 mg/dL   Creatinine, Ser 8.86 (H) 0.44 - 1.00 mg/dL   Calcium  9.8 8.9 - 10.3 mg/dL   Total Protein 8.0 6.5 - 8.1 g/dL   Albumin 4.8 3.5 - 5.0  g/dL   AST 30 15 - 41 U/L   ALT 48 (H) 0 - 44 U/L   Alkaline Phosphatase 68 38 - 126 U/L   Total Bilirubin 1.2 0.0 - 1.2 mg/dL   GFR, Estimated 57 (L) >60 mL/min    Comment: (NOTE) Calculated using the CKD-EPI  Creatinine Equation (2021)    Anion gap 15 5 - 15    Comment: Performed at [facility], [address] [place], [place] [address]  CBC with Differential/Platelet     Status: Abnormal   Collection Time: 12/04/24  3:05 PM  Result Value Ref Range   WBC 6.7 4.0 - 10.5 K/uL   RBC 3.86 (L) 3.87 - 5.11 MIL/uL   Hemoglobin 10.8 (L) 12.0 - 15.0 g/dL   HCT 66.4 (L) 63.9 - 53.9 %   MCV 86.8 80.0 - 100.0 fL   MCH 28.0 26.0 - 34.0 pg   MCHC 32.2 30.0 - 36.0 g/dL   RDW 86.0 88.4 - 84.4 %   Platelets 227 150 - 400 K/uL   nRBC 0.0 0.0 - 0.2 %   Neutrophils Relative % 71 %   Neutro Abs 4.8 1.7 - 7.7 K/uL   Lymphocytes Relative 20 %   Lymphs Abs 1.4 0.7 - 4.0 K/uL   Monocytes Relative 7 %   Monocytes Absolute 0.5 0.1 - 1.0 K/uL   Eosinophils Relative 1 %   Eosinophils Absolute 0.1 0.0 - 0.5 K/uL   Basophils Relative 0 %   Basophils Absolute 0.0 0.0 - 0.1 K/uL   Immature Granulocytes 1 %   Abs Immature Granulocytes 0.04 0.00 - 0.07 K/uL    Comment: Performed at [facility], [address] [place], [place] [address]  Lipid panel     Status: Abnormal   Collection Time: 12/04/24  3:05 PM  Result Value Ref Range   Cholesterol 140 0 - 200 mg/dL    Comment:        ATP III CLASSIFICATION:  <200     mg/dL   Desirable  799-760  mg/dL   Borderline High  >=759    mg/dL   High           Triglycerides 91 <150 mg/dL   HDL 39 (L) >59 mg/dL   Total CHOL/HDL Ratio 3.6 RATIO   VLDL 18 0 - 40 mg/dL   LDL Cholesterol 83 0 - 99 mg/dL    Comment:        Total Cholesterol/HDL:CHD Risk Coronary Heart Disease Risk Table                     Men   Women  1/2 Average Risk   3.4   3.3  Average Risk       5.0   4.4  2 X Average Risk   9.6   7.1  3 X Average Risk  23.4   11.0        Use the calculated Patient Ratio above and the CHD Risk Table to determine the patient's CHD Risk.        ATP III CLASSIFICATION (LDL):  <100     mg/dL   Optimal  899-870  mg/dL   Near or Above                     Optimal  130-159  mg/dL   Borderline  839-810  mg/dL   High  >809     mg/dL   Very High Performed  at [facility], [address] [place], [place] [address]   Type and screen [facility]     Status: None   Collection Time: 12/29/24  1:40 PM  Result Value Ref Range   ABO/RH(D) AB POS    Antibody Screen NEG    Sample Expiration 01/12/2025,2359    Extend sample reason      NO TRANSFUSIONS OR PREGNANCY IN THE PAST 3 MONTHS Performed at [facility], [address] [place], [place] [address]   Fructosamine     Status: Abnormal   Collection Time: 12/29/24  1:48 PM  Result Value Ref Range   Fructosamine 483 (H) 0 - 285 umol/L    Comment: (NOTE) Published reference interval for apparently healthy subjects between age 52 and 46 is 38 - 285 umol/L and in a poorly controlled diabetic population is 228 - 563 umol/L with a mean of 396 umol/L. Performed At: [facility] [address] [place], [place] [id number] Jennette Shorter MD Ey:[id number]   Surgical pcr screen     Status: Abnormal   Collection Time: 12/29/24  2:03 PM   Specimen: Nasal Mucosa; Nasal Swab  Result Value Ref Range   MRSA, PCR NEGATIVE NEGATIVE   Staphylococcus aureus POSITIVE (A) NEGATIVE    Comment: (NOTE) The Xpert SA Assay (FDA approved for NASAL specimens in patients 40 years of age and older), is one component of a comprehensive surveillance program. It is not intended to diagnose infection nor to guide or monitor treatment. Performed at [facility], [address] [place], [place] [address]   Urinalysis, Complete w Microscopic -Urine, Clean Catch     Status: Abnormal   Collection Time: 12/29/24  2:03 PM  Result Value Ref Range   Color, Urine STRAW (A) YELLOW   APPearance CLEAR (A) CLEAR   Specific Gravity, Urine 1.028 1.005 - 1.030   pH 5.0 5.0 - 8.0   Glucose, UA >=500 (A) NEGATIVE mg/dL   Hgb urine dipstick SMALL (A) NEGATIVE    Bilirubin Urine NEGATIVE NEGATIVE   Ketones, ur NEGATIVE NEGATIVE mg/dL   Protein, ur NEGATIVE NEGATIVE mg/dL   Nitrite NEGATIVE NEGATIVE   Leukocytes,Ua NEGATIVE NEGATIVE   RBC / HPF 0-5 0 - 5 RBC/hpf   WBC, UA 0 0 - 5 WBC/hpf   Bacteria, UA RARE (A) NONE SEEN   Squamous Epithelial / HPF 0-5 0 - 5 /HPF   Mucus PRESENT     Comment: Performed at [facility], [address] [place], [place] [address]      PHQ2/9:    01/03/2025   11:17 AM 12/28/2024    2:19 PM 11/30/2024    2:26 PM 11/16/2024    6:15 PM 11/14/2024   10:32 AM  Depression screen PHQ 2/9  Decreased Interest 3 3 1 3 1   Down, Depressed, Hopeless 2 2 1 3 1   PHQ - 2 Score 5 5 2 6 2   Altered sleeping 3 3   1   Tired, decreased energy 3 3   1   Change in appetite 1 1   0  Feeling bad or failure about yourself  1 1   0  Trouble concentrating 2 2   1   Moving slowly or fidgety/restless 2 2   0  Suicidal thoughts 0 0  0 0  PHQ-9 Score 17 17   5   Difficult doing work/chores Somewhat difficult    Somewhat difficult    phq 9 is positive  Fall Risk:  01/03/2025   11:17 AM 12/28/2024    2:07 PM 11/30/2024    2:08 PM 11/16/2024    3:17 PM 11/14/2024   10:32 AM  Fall Risk   Falls in the past year? 1 1 1 1 1   Number falls in past yr: 0 1 1 1 1   Injury with Fall? 1 1 1 1  0  Risk for fall due to : Impaired balance/gait Impaired balance/gait;History of fall(s);Impaired mobility;Orthopedic patient Impaired balance/gait;History of fall(s);Impaired mobility;Orthopedic patient Impaired balance/gait;Impaired mobility;Orthopedic patient Impaired balance/gait  Follow up Falls evaluation completed  Falls evaluation completed;Education provided Falls evaluation completed;Falls prevention discussed Falls evaluation completed     Assessment & Plan Preoperative evaluation for cervical spine surgery Scheduled for C57 anterior cervical discectomy under general anesthesia. Advanced heart disease increases surgical risk.  Cardiologist clearance obtained, but diabetes control is a concern. High risk of infection due to uncontrolled diabetes. Decision to postpone surgery due to high risk of complications. - Postponed cervical spine surgery for one month to improve diabetes control. - Contacted Dr. Claudene to discuss postponement and plan for future surgery clearance.  Uncontrolled type 2 diabetes mellitus with hyperglycemia Diabetes is uncontrolled with recent fructosamine levels indicating poor glucose control. A1c previously at 8.4%. Current glucose levels are in the 200s, increasing risk of infection and surgical complications. Dietary habits contribute to poor control. Insulin  regimen includes Novolog  and generic Basaglar , but adherence is inconsistent. Discussed dietary modifications and potential use of Omnipod for insulin  delivery. Emphasized importance of dietary changes to reduce carbohydrate intake and improve glucose control. Discussed risks of high glucose levels, including increased risk of infection, heart attack, and poor wound healing. - Implement dietary changes to reduce carbohydrate intake, including eliminating oatmeal, apples, bananas, pizza, pasta, rice, crackers, and sweet beverages. - Encouraged consumption of low-carb options such as omelets, Greek yogurt with nuts, and vegetables like broccoli, cauliflower, and spinach. - Continue current insulin  regimen with Novolog  and generic Basaglar . - Consider Omnipod for insulin  delivery after carbohydrate education. - Schedule fructosamine test two days before follow-up visit to assess glucose control. - Educated on the importance of consistent insulin  administration and dietary adherence.        [1]  Current Outpatient Medications:    amLODipine  (NORVASC ) 2.5 MG tablet, Take 1 tablet (2.5 mg total) by mouth daily., Disp: 90 tablet, Rfl: 3   aspirin  EC 81 MG tablet, Take 81 mg by mouth daily., Disp: , Rfl:    atorvastatin  (LIPITOR) 80 MG tablet, Take  1 tablet (80 mg total) by mouth daily., Disp: 30 tablet, Rfl: 1   Blood Glucose Monitoring Suppl (ACCU-CHEK GUIDE) w/Device KIT, Use to check blood sugar 2 time daily as needed, Disp: 1 kit, Rfl: 0   carvedilol  (COREG ) 12.5 MG tablet, TAKE 1 TABLET BY MOUTH 2 TIMES DAILY., Disp: 180 tablet, Rfl: 2   Continuous Glucose Sensor (DEXCOM G7 SENSOR) MISC, 1 DEVICE BY DOES NOT APPLY ROUTE AS DIRECTED., Disp: 3 each, Rfl: 11   ENTRESTO  24-26 MG, Take 1 tablet by mouth 2 (two) times daily., Disp: 180 tablet, Rfl: 3   ezetimibe  (ZETIA ) 10 MG tablet, Take 1 tablet (10 mg total) by mouth daily., Disp: 30 tablet, Rfl: 1   gabapentin  (NEURONTIN ) 600 MG tablet, Take 600 mg by mouth 3 (three) times daily., Disp: , Rfl:    Galcanezumab -gnlm (EMGALITY ) 120 MG/ML SOSY, Inject 1 mL into the skin every 30 (thirty) days., Disp: 1.12 mL, Rfl: 5   glucose blood (ACCU-CHEK GUIDE) test strip,  Check blood sugar 2 times daily as needed, Disp: 100 each, Rfl: 12   insulin  aspart (NOVOLOG ) 100 UNIT/ML injection, Inject 10 Units into the skin with breakfast, with lunch, and with evening meal., Disp: 10 mL, Rfl: 11   Insulin  Glargine (BASAGLAR  KWIKPEN) 100 UNIT/ML, Inject 70 Units into the skin daily., Disp: 60 mL, Rfl: 3   Insulin  Pen Needle 32G X 4 MM MISC, 1 Device by Does not apply route in the morning, at noon, in the evening, and at bedtime., Disp: 400 each, Rfl: 3   ipratropium (ATROVENT ) 0.03 % nasal spray, PLACE 2 SPRAYS INTO BOTH NOSTRILS 2 (TWO) TIMES DAILY, Disp: 30 mL, Rfl: 2   JARDIANCE  25 MG TABS tablet, Take 1 tablet (25 mg total) by mouth daily., Disp: 90 tablet, Rfl: 2   Lancets (ACCU-CHEK MULTICLIX) lancets, Check sugar 2 times daily as needed., Disp: 100 each, Rfl: 12   levocetirizine (XYZAL ) 5 MG tablet, Take 1 tablet (5 mg total) by mouth in the morning., Disp: 34 tablet, Rfl: 5   linaclotide  (LINZESS ) 290 MCG CAPS capsule, Take 1 capsule (290 mcg total) by mouth daily before breakfast., Disp: 30 capsule, Rfl:  11   metFORMIN  (GLUCOPHAGE -XR) 500 MG 24 hr tablet, Take 1,000 mg by mouth 2 (two) times daily., Disp: , Rfl:    MOUNJARO  10 MG/0.5ML Pen, Inject 10 mg into the skin once a week., Disp: , Rfl:    nitroGLYCERIN  (NITROSTAT ) 0.4 MG SL tablet, PLACE 1 TABLET UNDER THE TONGUE EVERY 5 MINUTES AS NEEDED FOR CHEST PAIN., Disp: 25 tablet, Rfl: 0   Rimegepant Sulfate  (NURTEC) 75 MG TBDP, Take 1 tablet (75 mg total) by mouth daily as needed. Max of every other day prn, Disp: 16 tablet, Rfl: 0   spironolactone  (ALDACTONE ) 25 MG tablet, Take 1 tablet (25 mg total) by mouth daily., Disp: 90 tablet, Rfl: 3   VENTOLIN  HFA 108 (90 Base) MCG/ACT inhaler, INHALE 2 PUFFS BY MOUTH EVERY 4 HOURS AS NEEDED FOR WHEEZE OR FOR SHORTNESS OF BREATH, Disp: 18 each, Rfl: 1   naproxen  (EC NAPROSYN ) 500 MG EC tablet, Take 1 tablet (500 mg total) by mouth 2 (two) times daily with a meal. (Patient not taking: Reported on 01/03/2025), Disp: 30 tablet, Rfl: 0 [2] No Known Allergies

## 2025-01-03 NOTE — Telephone Encounter (Signed)
 Called patient to change surgery date change to 2/24. Changed on OR board, notified PAT and reps, rescheduled post ops. Currently approved prior auth still applicable.

## 2025-01-04 DIAGNOSIS — R2689 Other abnormalities of gait and mobility: Secondary | ICD-10-CM | POA: Insufficient documentation

## 2025-01-04 DIAGNOSIS — G959 Disease of spinal cord, unspecified: Secondary | ICD-10-CM | POA: Insufficient documentation

## 2025-01-04 DIAGNOSIS — R2681 Unsteadiness on feet: Secondary | ICD-10-CM | POA: Insufficient documentation

## 2025-01-04 DIAGNOSIS — M4712 Other spondylosis with myelopathy, cervical region: Secondary | ICD-10-CM | POA: Insufficient documentation

## 2025-01-04 DIAGNOSIS — R269 Unspecified abnormalities of gait and mobility: Secondary | ICD-10-CM

## 2025-01-09 ENCOUNTER — Ambulatory Visit: Admitting: Family Medicine

## 2025-01-12 ENCOUNTER — Telehealth

## 2025-01-15 ENCOUNTER — Ambulatory Visit: Admitting: Cardiology

## 2025-01-15 ENCOUNTER — Ambulatory Visit: Admitting: Family Medicine

## 2025-01-18 ENCOUNTER — Telehealth: Payer: Self-pay | Admitting: Neurosurgery

## 2025-01-18 NOTE — Telephone Encounter (Addendum)
 Called patient to let her know that we already have cardiology clearance. We still need PCP clearance due to her elevated blood sugars. Patient has an appointment with PCP next week on 2/11. Patient asked if she should wait another week for this appointment. Told her to keep this appointment.

## 2025-01-18 NOTE — Telephone Encounter (Signed)
 Patient is calling to find out if she needs to repeat her surgical clearance for her PCP and cardiologist. Please advise.

## 2025-01-22 ENCOUNTER — Encounter: Admitting: Physician Assistant

## 2025-01-24 ENCOUNTER — Ambulatory Visit: Admitting: Family Medicine

## 2025-01-30 ENCOUNTER — Encounter (INDEPENDENT_AMBULATORY_CARE_PROVIDER_SITE_OTHER): Admitting: Ophthalmology

## 2025-01-30 DIAGNOSIS — H35033 Hypertensive retinopathy, bilateral: Secondary | ICD-10-CM

## 2025-01-30 DIAGNOSIS — I10 Essential (primary) hypertension: Secondary | ICD-10-CM

## 2025-01-30 DIAGNOSIS — H25813 Combined forms of age-related cataract, bilateral: Secondary | ICD-10-CM

## 2025-01-30 DIAGNOSIS — Z794 Long term (current) use of insulin: Secondary | ICD-10-CM

## 2025-01-30 DIAGNOSIS — Z7984 Long term (current) use of oral hypoglycemic drugs: Secondary | ICD-10-CM

## 2025-01-30 DIAGNOSIS — E113513 Type 2 diabetes mellitus with proliferative diabetic retinopathy with macular edema, bilateral: Secondary | ICD-10-CM

## 2025-01-30 DIAGNOSIS — H4311 Vitreous hemorrhage, right eye: Secondary | ICD-10-CM

## 2025-02-01 ENCOUNTER — Telehealth

## 2025-02-01 ENCOUNTER — Other Ambulatory Visit

## 2025-02-06 ENCOUNTER — Encounter: Admission: RE | Payer: Self-pay | Source: Home / Self Care

## 2025-02-06 ENCOUNTER — Ambulatory Visit: Admission: RE | Admit: 2025-02-06 | Source: Home / Self Care | Admitting: Neurosurgery

## 2025-02-06 ENCOUNTER — Encounter: Payer: Self-pay | Admitting: Urgent Care

## 2025-02-06 DIAGNOSIS — G959 Disease of spinal cord, unspecified: Secondary | ICD-10-CM | POA: Insufficient documentation

## 2025-02-06 DIAGNOSIS — R269 Unspecified abnormalities of gait and mobility: Secondary | ICD-10-CM

## 2025-02-06 DIAGNOSIS — R2689 Other abnormalities of gait and mobility: Secondary | ICD-10-CM | POA: Insufficient documentation

## 2025-02-06 DIAGNOSIS — R2681 Unsteadiness on feet: Secondary | ICD-10-CM | POA: Insufficient documentation

## 2025-02-06 DIAGNOSIS — M4712 Other spondylosis with myelopathy, cervical region: Secondary | ICD-10-CM | POA: Insufficient documentation

## 2025-02-06 SURGERY — ANTERIOR CERVICAL DECOMPRESSION/DISCECTOMY FUSION 2 LEVELS
Anesthesia: General

## 2025-02-14 ENCOUNTER — Encounter: Admitting: Neurosurgery

## 2025-02-14 ENCOUNTER — Other Ambulatory Visit

## 2025-02-20 ENCOUNTER — Encounter

## 2025-03-08 ENCOUNTER — Ambulatory Visit: Admitting: Family Medicine

## 2025-03-09 ENCOUNTER — Ambulatory Visit: Admitting: Cardiology

## 2025-03-14 ENCOUNTER — Ambulatory Visit: Admitting: Cardiology

## 2025-03-19 ENCOUNTER — Other Ambulatory Visit

## 2025-03-19 ENCOUNTER — Encounter

## 2025-03-21 ENCOUNTER — Other Ambulatory Visit

## 2025-03-21 ENCOUNTER — Encounter: Admitting: Physician Assistant

## 2025-04-30 ENCOUNTER — Other Ambulatory Visit

## 2025-04-30 ENCOUNTER — Encounter
